# Patient Record
Sex: Female | Born: 1970 | Race: White | Hispanic: No | Marital: Married | Smoking: Former smoker
Health system: Southern US, Community
[De-identification: ages and names within clinical notes are randomized; demographics above are authoritative.]

## PROBLEM LIST (undated history)

## (undated) DIAGNOSIS — G479 Sleep disorder, unspecified: Secondary | ICD-10-CM

## (undated) DIAGNOSIS — K219 Gastro-esophageal reflux disease without esophagitis: Secondary | ICD-10-CM

## (undated) DIAGNOSIS — F319 Bipolar disorder, unspecified: Secondary | ICD-10-CM

## (undated) DIAGNOSIS — R000 Tachycardia, unspecified: Secondary | ICD-10-CM

## (undated) DIAGNOSIS — F411 Generalized anxiety disorder: Secondary | ICD-10-CM

## (undated) DIAGNOSIS — R002 Palpitations: Secondary | ICD-10-CM

## (undated) DIAGNOSIS — R7303 Prediabetes: Secondary | ICD-10-CM

## (undated) DIAGNOSIS — E119 Type 2 diabetes mellitus without complications: Secondary | ICD-10-CM

## (undated) DIAGNOSIS — E785 Hyperlipidemia, unspecified: Secondary | ICD-10-CM

## (undated) DIAGNOSIS — D649 Anemia, unspecified: Secondary | ICD-10-CM

## (undated) DIAGNOSIS — M549 Dorsalgia, unspecified: Secondary | ICD-10-CM

## (undated) DIAGNOSIS — Z87440 Personal history of urinary (tract) infections: Secondary | ICD-10-CM

## (undated) DIAGNOSIS — Z9889 Other specified postprocedural states: Secondary | ICD-10-CM

## (undated) DIAGNOSIS — G4730 Sleep apnea, unspecified: Secondary | ICD-10-CM

## (undated) DIAGNOSIS — I951 Orthostatic hypotension: Secondary | ICD-10-CM

## (undated) DIAGNOSIS — D509 Iron deficiency anemia, unspecified: Secondary | ICD-10-CM

## (undated) DIAGNOSIS — Z8489 Family history of other specified conditions: Secondary | ICD-10-CM

## (undated) DIAGNOSIS — F32A Depression, unspecified: Secondary | ICD-10-CM

## (undated) DIAGNOSIS — N209 Urinary calculus, unspecified: Secondary | ICD-10-CM

## (undated) DIAGNOSIS — K5909 Other constipation: Secondary | ICD-10-CM

## (undated) DIAGNOSIS — E039 Hypothyroidism, unspecified: Secondary | ICD-10-CM

## (undated) DIAGNOSIS — R112 Nausea with vomiting, unspecified: Secondary | ICD-10-CM

## (undated) DIAGNOSIS — R0602 Shortness of breath: Secondary | ICD-10-CM

## (undated) DIAGNOSIS — M2550 Pain in unspecified joint: Secondary | ICD-10-CM

## (undated) DIAGNOSIS — E559 Vitamin D deficiency, unspecified: Secondary | ICD-10-CM

## (undated) DIAGNOSIS — M1990 Unspecified osteoarthritis, unspecified site: Secondary | ICD-10-CM

## (undated) DIAGNOSIS — K449 Diaphragmatic hernia without obstruction or gangrene: Secondary | ICD-10-CM

## (undated) DIAGNOSIS — J302 Other seasonal allergic rhinitis: Secondary | ICD-10-CM

## (undated) DIAGNOSIS — N200 Calculus of kidney: Secondary | ICD-10-CM

## (undated) DIAGNOSIS — Z973 Presence of spectacles and contact lenses: Secondary | ICD-10-CM

## (undated) DIAGNOSIS — K5900 Constipation, unspecified: Secondary | ICD-10-CM

## (undated) DIAGNOSIS — R45851 Suicidal ideations: Secondary | ICD-10-CM

## (undated) DIAGNOSIS — F419 Anxiety disorder, unspecified: Secondary | ICD-10-CM

## (undated) DIAGNOSIS — K760 Fatty (change of) liver, not elsewhere classified: Secondary | ICD-10-CM

## (undated) DIAGNOSIS — R531 Weakness: Secondary | ICD-10-CM

## (undated) DIAGNOSIS — Z87442 Personal history of urinary calculi: Secondary | ICD-10-CM

## (undated) DIAGNOSIS — Z8711 Personal history of peptic ulcer disease: Secondary | ICD-10-CM

## (undated) DIAGNOSIS — R1310 Dysphagia, unspecified: Secondary | ICD-10-CM

## (undated) DIAGNOSIS — K5792 Diverticulitis of intestine, part unspecified, without perforation or abscess without bleeding: Secondary | ICD-10-CM

## (undated) DIAGNOSIS — R0683 Snoring: Secondary | ICD-10-CM

## (undated) DIAGNOSIS — E781 Pure hyperglyceridemia: Secondary | ICD-10-CM

## (undated) DIAGNOSIS — T7840XA Allergy, unspecified, initial encounter: Secondary | ICD-10-CM

## (undated) DIAGNOSIS — R42 Dizziness and giddiness: Secondary | ICD-10-CM

## (undated) DIAGNOSIS — Z8659 Personal history of other mental and behavioral disorders: Secondary | ICD-10-CM

## (undated) DIAGNOSIS — F329 Major depressive disorder, single episode, unspecified: Secondary | ICD-10-CM

## (undated) DIAGNOSIS — Z8619 Personal history of other infectious and parasitic diseases: Secondary | ICD-10-CM

## (undated) DIAGNOSIS — F99 Mental disorder, not otherwise specified: Secondary | ICD-10-CM

## (undated) HISTORY — DX: Pain in unspecified joint: M25.50

## (undated) HISTORY — DX: Tachycardia, unspecified: R00.0

## (undated) HISTORY — DX: Fatty (change of) liver, not elsewhere classified: K76.0

## (undated) HISTORY — DX: Vitamin D deficiency, unspecified: E55.9

## (undated) HISTORY — DX: Weakness: R53.1

## (undated) HISTORY — DX: Dysphagia, unspecified: R13.10

## (undated) HISTORY — DX: Snoring: R06.83

## (undated) HISTORY — PX: TUBAL LIGATION: SHX77

## (undated) HISTORY — PX: NASAL SINUS SURGERY: SHX719

## (undated) HISTORY — DX: Suicidal ideations: R45.851

## (undated) HISTORY — PX: ESOPHAGOGASTRODUODENOSCOPY: SHX1529

## (undated) HISTORY — DX: Palpitations: R00.2

## (undated) HISTORY — DX: Shortness of breath: R06.02

## (undated) HISTORY — DX: Anemia, unspecified: D64.9

## (undated) HISTORY — PX: ANTERIOR CERVICAL DECOMP/DISCECTOMY FUSION: SHX1161

## (undated) HISTORY — DX: Type 2 diabetes mellitus without complications: E11.9

## (undated) HISTORY — DX: Personal history of peptic ulcer disease: Z87.11

## (undated) HISTORY — DX: Constipation, unspecified: K59.00

## (undated) HISTORY — DX: Prediabetes: R73.03

## (undated) HISTORY — PX: DIAGNOSTIC LAPAROSCOPY: SUR761

## (undated) HISTORY — DX: Dizziness and giddiness: R42

## (undated) HISTORY — DX: Allergy, unspecified, initial encounter: T78.40XA

## (undated) HISTORY — DX: Orthostatic hypotension: I95.1

## (undated) HISTORY — DX: Pure hyperglyceridemia: E78.1

## (undated) HISTORY — DX: Dorsalgia, unspecified: M54.9

---
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
## 1990-02-11 HISTORY — PX: DILATION AND CURETTAGE OF UTERUS: SHX78

## 1993-02-11 HISTORY — PX: BREAST REDUCTION SURGERY: SHX8

## 1993-02-11 HISTORY — PX: BREAST SURGERY: SHX581

## 1996-02-12 HISTORY — PX: MICROTUBOPLASTY: SHX5401

## 1997-08-29 ENCOUNTER — Encounter: Admission: RE | Admit: 1997-08-29 | Discharge: 1997-11-27 | Payer: Self-pay | Admitting: *Deleted

## 1998-05-19 ENCOUNTER — Other Ambulatory Visit: Admission: RE | Admit: 1998-05-19 | Discharge: 1998-05-19 | Payer: Self-pay | Admitting: *Deleted

## 1999-04-17 ENCOUNTER — Encounter: Admission: RE | Admit: 1999-04-17 | Discharge: 1999-04-17 | Payer: Self-pay | Admitting: *Deleted

## 1999-06-06 ENCOUNTER — Other Ambulatory Visit: Admission: RE | Admit: 1999-06-06 | Discharge: 1999-06-06 | Payer: Self-pay | Admitting: Obstetrics and Gynecology

## 1999-10-29 ENCOUNTER — Encounter (INDEPENDENT_AMBULATORY_CARE_PROVIDER_SITE_OTHER): Payer: Self-pay

## 1999-10-29 ENCOUNTER — Other Ambulatory Visit: Admission: RE | Admit: 1999-10-29 | Discharge: 1999-10-29 | Payer: Self-pay | Admitting: Otolaryngology

## 2000-06-13 ENCOUNTER — Other Ambulatory Visit: Admission: RE | Admit: 2000-06-13 | Discharge: 2000-06-13 | Payer: Self-pay | Admitting: Obstetrics and Gynecology

## 2000-07-31 ENCOUNTER — Encounter: Payer: Self-pay | Admitting: Gastroenterology

## 2000-07-31 ENCOUNTER — Ambulatory Visit (HOSPITAL_COMMUNITY): Admission: RE | Admit: 2000-07-31 | Discharge: 2000-07-31 | Payer: Self-pay | Admitting: Gastroenterology

## 2000-11-14 ENCOUNTER — Encounter (INDEPENDENT_AMBULATORY_CARE_PROVIDER_SITE_OTHER): Payer: Self-pay | Admitting: Specialist

## 2000-11-14 ENCOUNTER — Ambulatory Visit (HOSPITAL_COMMUNITY): Admission: RE | Admit: 2000-11-14 | Discharge: 2000-11-14 | Payer: Self-pay | Admitting: Gastroenterology

## 2001-01-01 ENCOUNTER — Encounter: Admission: RE | Admit: 2001-01-01 | Discharge: 2001-01-01 | Payer: Self-pay | Admitting: Obstetrics and Gynecology

## 2001-01-01 ENCOUNTER — Encounter: Payer: Self-pay | Admitting: Obstetrics and Gynecology

## 2001-01-29 ENCOUNTER — Encounter: Payer: Self-pay | Admitting: Family Medicine

## 2001-01-29 ENCOUNTER — Ambulatory Visit (HOSPITAL_COMMUNITY): Admission: RE | Admit: 2001-01-29 | Discharge: 2001-01-29 | Payer: Self-pay | Admitting: Family Medicine

## 2001-04-01 ENCOUNTER — Encounter: Payer: Self-pay | Admitting: Neurosurgery

## 2001-04-01 ENCOUNTER — Observation Stay (HOSPITAL_COMMUNITY): Admission: RE | Admit: 2001-04-01 | Discharge: 2001-04-01 | Payer: Self-pay | Admitting: Neurosurgery

## 2001-06-16 ENCOUNTER — Other Ambulatory Visit: Admission: RE | Admit: 2001-06-16 | Discharge: 2001-06-16 | Payer: Self-pay | Admitting: Obstetrics & Gynecology

## 2002-02-11 HISTORY — PX: CERVICAL SPINE SURGERY: SHX589

## 2002-07-30 ENCOUNTER — Other Ambulatory Visit: Admission: RE | Admit: 2002-07-30 | Discharge: 2002-07-30 | Payer: Self-pay | Admitting: Obstetrics & Gynecology

## 2003-04-27 ENCOUNTER — Encounter: Admission: RE | Admit: 2003-04-27 | Discharge: 2003-04-27 | Payer: Self-pay | Admitting: Obstetrics & Gynecology

## 2006-11-03 DIAGNOSIS — D229 Melanocytic nevi, unspecified: Secondary | ICD-10-CM

## 2006-11-03 DIAGNOSIS — C439 Malignant melanoma of skin, unspecified: Secondary | ICD-10-CM

## 2006-11-03 HISTORY — DX: Melanocytic nevi, unspecified: D22.9

## 2006-11-03 HISTORY — DX: Malignant melanoma of skin, unspecified: C43.9

## 2007-11-03 ENCOUNTER — Emergency Department (HOSPITAL_COMMUNITY): Admission: EM | Admit: 2007-11-03 | Discharge: 2007-11-03 | Payer: Self-pay | Admitting: Emergency Medicine

## 2008-02-22 DIAGNOSIS — C8590 Non-Hodgkin lymphoma, unspecified, unspecified site: Secondary | ICD-10-CM | POA: Insufficient documentation

## 2008-05-02 ENCOUNTER — Inpatient Hospital Stay (HOSPITAL_COMMUNITY): Admission: RE | Admit: 2008-05-02 | Discharge: 2008-05-04 | Payer: Self-pay | Admitting: Psychiatry

## 2008-05-02 ENCOUNTER — Ambulatory Visit: Payer: Self-pay | Admitting: Psychiatry

## 2008-05-02 ENCOUNTER — Emergency Department (HOSPITAL_COMMUNITY): Admission: EM | Admit: 2008-05-02 | Discharge: 2008-05-02 | Payer: Self-pay | Admitting: Emergency Medicine

## 2008-10-29 ENCOUNTER — Inpatient Hospital Stay (HOSPITAL_COMMUNITY): Admission: EM | Admit: 2008-10-29 | Discharge: 2008-11-01 | Payer: Self-pay | Admitting: Emergency Medicine

## 2010-05-18 LAB — DIFFERENTIAL
Basophils Absolute: 0 10*3/uL (ref 0.0–0.1)
Basophils Absolute: 0 10*3/uL (ref 0.0–0.1)
Basophils Absolute: 0 10*3/uL (ref 0.0–0.1)
Basophils Relative: 0 % (ref 0–1)
Basophils Relative: 0 % (ref 0–1)
Basophils Relative: 0 % (ref 0–1)
Eosinophils Absolute: 0.2 10*3/uL (ref 0.0–0.7)
Eosinophils Absolute: 0.2 10*3/uL (ref 0.0–0.7)
Eosinophils Absolute: 0.3 10*3/uL (ref 0.0–0.7)
Eosinophils Relative: 1 % (ref 0–5)
Eosinophils Relative: 2 % (ref 0–5)
Eosinophils Relative: 3 % (ref 0–5)
Lymphocytes Relative: 16 % (ref 12–46)
Lymphocytes Relative: 20 % (ref 12–46)
Lymphocytes Relative: 22 % (ref 12–46)
Lymphs Abs: 1.9 10*3/uL (ref 0.7–4.0)
Lymphs Abs: 2 10*3/uL (ref 0.7–4.0)
Lymphs Abs: 2.2 10*3/uL (ref 0.7–4.0)
Monocytes Absolute: 0.9 10*3/uL (ref 0.1–1.0)
Monocytes Absolute: 1 10*3/uL (ref 0.1–1.0)
Monocytes Absolute: 1.1 10*3/uL — ABNORMAL HIGH (ref 0.1–1.0)
Monocytes Relative: 11 % (ref 3–12)
Monocytes Relative: 8 % (ref 3–12)
Monocytes Relative: 9 % (ref 3–12)
Neutro Abs: 6.5 10*3/uL (ref 1.7–7.7)
Neutro Abs: 6.7 10*3/uL (ref 1.7–7.7)
Neutro Abs: 9.4 10*3/uL — ABNORMAL HIGH (ref 1.7–7.7)
Neutrophils Relative %: 65 % (ref 43–77)
Neutrophils Relative %: 67 % (ref 43–77)
Neutrophils Relative %: 75 % (ref 43–77)

## 2010-05-18 LAB — CBC
HCT: 34.9 % — ABNORMAL LOW (ref 36.0–46.0)
HCT: 36 % (ref 36.0–46.0)
HCT: 36.8 % (ref 36.0–46.0)
Hemoglobin: 12 g/dL (ref 12.0–15.0)
Hemoglobin: 12.5 g/dL (ref 12.0–15.0)
Hemoglobin: 12.7 g/dL (ref 12.0–15.0)
MCHC: 34.4 g/dL (ref 30.0–36.0)
MCHC: 34.6 g/dL (ref 30.0–36.0)
MCHC: 34.6 g/dL (ref 30.0–36.0)
MCV: 91.6 fL (ref 78.0–100.0)
MCV: 92.2 fL (ref 78.0–100.0)
MCV: 92.6 fL (ref 78.0–100.0)
Platelets: 274 10*3/uL (ref 150–400)
Platelets: 283 10*3/uL (ref 150–400)
Platelets: 301 10*3/uL (ref 150–400)
RBC: 3.77 MIL/uL — ABNORMAL LOW (ref 3.87–5.11)
RBC: 3.91 MIL/uL (ref 3.87–5.11)
RBC: 4.02 MIL/uL (ref 3.87–5.11)
RDW: 13.2 % (ref 11.5–15.5)
RDW: 13.3 % (ref 11.5–15.5)
RDW: 13.3 % (ref 11.5–15.5)
WBC: 10 10*3/uL (ref 4.0–10.5)
WBC: 10.1 10*3/uL (ref 4.0–10.5)
WBC: 12.6 10*3/uL — ABNORMAL HIGH (ref 4.0–10.5)

## 2010-05-18 LAB — BASIC METABOLIC PANEL
BUN: 5 mg/dL — ABNORMAL LOW (ref 6–23)
BUN: 6 mg/dL (ref 6–23)
CO2: 25 mEq/L (ref 19–32)
CO2: 30 mEq/L (ref 19–32)
Calcium: 8.4 mg/dL (ref 8.4–10.5)
Calcium: 9 mg/dL (ref 8.4–10.5)
Chloride: 109 mEq/L (ref 96–112)
Chloride: 113 mEq/L — ABNORMAL HIGH (ref 96–112)
Creatinine, Ser: 0.55 mg/dL (ref 0.4–1.2)
Creatinine, Ser: 0.57 mg/dL (ref 0.4–1.2)
GFR calc Af Amer: >60 mL/min (ref >60)
GFR calc Af Amer: >60 mL/min (ref >60)
GFR calc non Af Amer: >60 mL/min (ref >60)
GFR calc non Af Amer: >60 mL/min (ref >60)
Glucose, Bld: 77 mg/dL (ref 70–99)
Glucose, Bld: 80 mg/dL (ref 70–99)
Potassium: 3.4 mEq/L — ABNORMAL LOW (ref 3.5–5.1)
Potassium: 4 mEq/L (ref 3.5–5.1)
Sodium: 143 mEq/L (ref 135–145)
Sodium: 143 mEq/L (ref 135–145)

## 2010-05-18 LAB — MAGNESIUM: Magnesium: 2 mg/dL (ref 1.5–2.5)

## 2010-05-18 LAB — ETHANOL: Alcohol, Ethyl (B): <5 mg/dL (ref 0–10)

## 2010-05-18 LAB — BLOOD GAS, ARTERIAL
Acid-Base Excess: 0.4 mmol/L (ref 0.0–2.0)
Bicarbonate: 24.2 mEq/L — ABNORMAL HIGH (ref 20.0–24.0)
O2 Content: 21 L/min
O2 Saturation: 98.2 %
Patient temperature: 37
TCO2: 21.7 mmol/L (ref 0–100)
pCO2 arterial: 37 mmHg (ref 35.0–45.0)
pH, Arterial: 7.431 — ABNORMAL HIGH (ref 7.350–7.400)
pO2, Arterial: 106 mmHg — ABNORMAL HIGH (ref 80.0–100.0)

## 2010-05-18 LAB — SALICYLATE LEVEL: Salicylate Lvl: <4 mg/dL (ref 2.8–20.0)

## 2010-05-18 LAB — HEPATIC FUNCTION PANEL
ALT: 15 U/L (ref 0–35)
AST: 23 U/L (ref 0–37)
Albumin: 3.4 g/dL — ABNORMAL LOW (ref 3.5–5.2)
Alkaline Phosphatase: 59 U/L (ref 39–117)
Bilirubin, Direct: 0.1 mg/dL (ref 0.0–0.3)
Indirect Bilirubin: 0.3 mg/dL (ref 0.3–0.9)
Total Bilirubin: 0.4 mg/dL (ref 0.3–1.2)
Total Protein: 5.7 g/dL — ABNORMAL LOW (ref 6.0–8.3)

## 2010-05-18 LAB — COMPREHENSIVE METABOLIC PANEL
ALT: 12 U/L (ref 0–35)
AST: 18 U/L (ref 0–37)
Albumin: 3.2 g/dL — ABNORMAL LOW (ref 3.5–5.2)
Alkaline Phosphatase: 48 U/L (ref 39–117)
BUN: 4 mg/dL — ABNORMAL LOW (ref 6–23)
CO2: 26 mEq/L (ref 19–32)
Calcium: 8.3 mg/dL — ABNORMAL LOW (ref 8.4–10.5)
Chloride: 113 mEq/L — ABNORMAL HIGH (ref 96–112)
Creatinine, Ser: 0.38 mg/dL — ABNORMAL LOW (ref 0.4–1.2)
GFR calc Af Amer: >60 mL/min (ref >60)
GFR calc non Af Amer: >60 mL/min (ref >60)
Glucose, Bld: 82 mg/dL (ref 70–99)
Potassium: 3.8 mEq/L (ref 3.5–5.1)
Sodium: 143 mEq/L (ref 135–145)
Total Bilirubin: 0.7 mg/dL (ref 0.3–1.2)
Total Protein: 5.3 g/dL — ABNORMAL LOW (ref 6.0–8.3)

## 2010-05-18 LAB — URINALYSIS, ROUTINE W REFLEX MICROSCOPIC
Bilirubin Urine: NEGATIVE
Glucose, UA: NEGATIVE mg/dL
Hgb urine dipstick: NEGATIVE
Ketones, ur: NEGATIVE mg/dL
Nitrite: NEGATIVE
Protein, ur: NEGATIVE mg/dL
Specific Gravity, Urine: 1.01 (ref 1.005–1.030)
Urobilinogen, UA: 0.2 mg/dL (ref 0.0–1.0)
pH: 6 (ref 5.0–8.0)

## 2010-05-18 LAB — RAPID URINE DRUG SCREEN, HOSP PERFORMED
Amphetamines: NOT DETECTED
Barbiturates: NOT DETECTED
Benzodiazepines: POSITIVE — AB
Cocaine: NOT DETECTED
Opiates: NOT DETECTED
Tetrahydrocannabinol: NOT DETECTED

## 2010-05-18 LAB — VALPROIC ACID LEVEL
Valproic Acid Lvl: 44 ug/mL — ABNORMAL LOW (ref 50.0–100.0)
Valproic Acid Lvl: 46.5 ug/mL — ABNORMAL LOW (ref 50.0–100.0)

## 2010-05-18 LAB — ACETAMINOPHEN LEVEL: Acetaminophen (Tylenol), Serum: <10 ug/mL — ABNORMAL LOW (ref 10–30)

## 2010-05-18 LAB — PREGNANCY, URINE: Preg Test, Ur: NEGATIVE

## 2010-05-24 LAB — BASIC METABOLIC PANEL WITH GFR
BUN: 12 mg/dL (ref 6–23)
CO2: 23 meq/L (ref 19–32)
Calcium: 9.1 mg/dL (ref 8.4–10.5)
Chloride: 111 meq/L (ref 96–112)
Creatinine, Ser: 0.75 mg/dL (ref 0.4–1.2)
GFR calc non Af Amer: >60 mL/min
Glucose, Bld: 78 mg/dL (ref 70–99)
Potassium: 4 meq/L (ref 3.5–5.1)
Sodium: 141 meq/L (ref 135–145)

## 2010-05-24 LAB — DIFFERENTIAL
Basophils Absolute: 0.3 10*3/uL — ABNORMAL HIGH (ref 0.0–0.1)
Basophils Relative: 2 % — ABNORMAL HIGH (ref 0–1)
Eosinophils Absolute: 0.1 10*3/uL (ref 0.0–0.7)
Eosinophils Relative: 1 % (ref 0–5)
Lymphocytes Relative: 20 % (ref 12–46)
Lymphs Abs: 2.9 10*3/uL (ref 0.7–4.0)
Monocytes Absolute: 1.2 10*3/uL — ABNORMAL HIGH (ref 0.1–1.0)
Monocytes Relative: 8 % (ref 3–12)
Neutro Abs: 10 10*3/uL — ABNORMAL HIGH (ref 1.7–7.7)
Neutrophils Relative %: 69 % (ref 43–77)

## 2010-05-24 LAB — COMPREHENSIVE METABOLIC PANEL
ALT: 13 U/L (ref 0–35)
AST: 15 U/L (ref 0–37)
Albumin: 4.3 g/dL (ref 3.5–5.2)
Alkaline Phosphatase: 63 U/L (ref 39–117)
BUN: 14 mg/dL (ref 6–23)
CO2: 22 mEq/L (ref 19–32)
Calcium: 9.4 mg/dL (ref 8.4–10.5)
Chloride: 109 mEq/L (ref 96–112)
Creatinine, Ser: 0.6 mg/dL (ref 0.4–1.2)
GFR calc Af Amer: >60 mL/min (ref >60)
GFR calc non Af Amer: >60 mL/min (ref >60)
Glucose, Bld: 93 mg/dL (ref 70–99)
Potassium: 3 mEq/L — ABNORMAL LOW (ref 3.5–5.1)
Sodium: 138 mEq/L (ref 135–145)
Total Bilirubin: 0.7 mg/dL (ref 0.3–1.2)
Total Protein: 7.3 g/dL (ref 6.0–8.3)

## 2010-05-24 LAB — ACETAMINOPHEN LEVEL: Acetaminophen (Tylenol), Serum: <10 ug/mL — ABNORMAL LOW (ref 10–30)

## 2010-05-24 LAB — URINALYSIS, ROUTINE W REFLEX MICROSCOPIC
Bilirubin Urine: NEGATIVE
Glucose, UA: NEGATIVE mg/dL
Hgb urine dipstick: NEGATIVE
Ketones, ur: NEGATIVE mg/dL
Nitrite: NEGATIVE
Protein, ur: NEGATIVE mg/dL
Specific Gravity, Urine: 1.026 (ref 1.005–1.030)
Urobilinogen, UA: 1 mg/dL (ref 0.0–1.0)
pH: 6 (ref 5.0–8.0)

## 2010-05-24 LAB — GC/CHLAMYDIA PROBE AMP, URINE
Chlamydia, Swab/Urine, PCR: NEGATIVE
GC Probe Amp, Urine: NEGATIVE

## 2010-05-24 LAB — LIPID PANEL
Cholesterol: 161 mg/dL (ref 0–200)
HDL: 33 mg/dL — ABNORMAL LOW (ref >39)
LDL Cholesterol: 104 mg/dL — ABNORMAL HIGH (ref 0–99)
Total CHOL/HDL Ratio: 4.9 RATIO
Triglycerides: 121 mg/dL (ref <150)
VLDL: 24 mg/dL (ref 0–40)

## 2010-05-24 LAB — CBC
HCT: 42.5 % (ref 36.0–46.0)
Hemoglobin: 14.5 g/dL (ref 12.0–15.0)
MCHC: 34 g/dL (ref 30.0–36.0)
MCV: 91.2 fL (ref 78.0–100.0)
Platelets: 347 10*3/uL (ref 150–400)
RBC: 4.66 MIL/uL (ref 3.87–5.11)
RDW: 12.7 % (ref 11.5–15.5)
WBC: 14.5 10*3/uL — ABNORMAL HIGH (ref 4.0–10.5)

## 2010-05-24 LAB — RAPID URINE DRUG SCREEN, HOSP PERFORMED
Amphetamines: POSITIVE — AB
Barbiturates: NOT DETECTED
Benzodiazepines: NOT DETECTED
Cocaine: NOT DETECTED
Opiates: NOT DETECTED
Tetrahydrocannabinol: NOT DETECTED

## 2010-05-24 LAB — HEMOGLOBIN A1C
Hgb A1c MFr Bld: 4.7 % (ref 4.6–6.1)
Mean Plasma Glucose: 88 mg/dL

## 2010-05-24 LAB — VALPROIC ACID LEVEL: Valproic Acid Lvl: 49 ug/mL — ABNORMAL LOW (ref 50.0–100.0)

## 2010-05-24 LAB — ETHANOL: Alcohol, Ethyl (B): <5 mg/dL (ref 0–10)

## 2010-05-24 LAB — POCT PREGNANCY, URINE: Preg Test, Ur: NEGATIVE

## 2010-05-24 LAB — T4, FREE: Free T4: 1.23 ng/dL (ref 0.89–1.80)

## 2010-05-24 LAB — SYPHILIS: RPR W/REFLEX TO RPR TITER AND TREPONEMAL ANTIBODIES, TRADITIONAL SCREENING AND DIAGNOSIS ALGORITHM: RPR Ser Ql: NONREACTIVE

## 2010-05-24 LAB — VITAMIN B12: Vitamin B-12: 392 pg/mL (ref 211–911)

## 2010-05-24 LAB — SALICYLATE LEVEL: Salicylate Lvl: <4 mg/dL (ref 2.8–20.0)

## 2010-05-24 LAB — TSH: TSH: 2.324 u[IU]/mL (ref 0.350–4.500)

## 2010-05-24 LAB — MAGNESIUM: Magnesium: 2.1 mg/dL (ref 1.5–2.5)

## 2010-06-26 NOTE — H&P (Signed)
Kristen [name], Kristen [name]              ACCOUNT NO.:  [id number]   MEDICAL RECORD NO.:  [id number]          PATIENT TYPE:  IPS   LOCATION:  0405                          FACILITY:  [facility]   PHYSICIAN:  Anselm Jungling, MD  DATE OF BIRTH:  05/21/1970   DATE OF ADMISSION:  05/02/2008  DATE OF DISCHARGE:                       PSYCHIATRIC ADMISSION ASSESSMENT   IDENTIFYING INFORMATION:  A 40 year old female, married, and separated.  This is a voluntary admission.   HISTORY OF PRESENT ILLNESS:  First inpatient psychiatric admission for  this 40 year old who reports a history of mood swings getting  progressively worse over the last few years.  The day before yesterday,  she took 3 Klonopin, 2 Adderall, 1 Lamictal, and 3 Goody's powder at  once.  She had been writing suicide letters to her children; however,  these are not in the record.  Then she had threatened suicide to a  friend who brought her to the Emergency Room.  Since admission, she has  been quite tearful complaining of mood swings and not having a stable  mood for the past couple of years.  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  She reports going through a period of extreme  lows when she is able to sleep but no energy to get organized or  accomplish any tasks, and this would last for several days at a time  alternating with episodes of increased energy with poor sleep, impulsive  behavior, increased sex drive, racing thoughts, regretting impulsive  behaviors, and finding herself in compromised sexual situations.  Most  recently she had been taking her Depakote in hit or miss fashion over  the course of the past 3 weeks because she did not have her Topamax  prescription filled and she feared that without the  Topamax she would  suffer weight gain by taking the Depakote.  She endorses mood swings,  sadness, and suicidal thoughts with plans to overdose.  She had counted  out her pills at home to see if she had enough to harm herself.   PAST PSYCHIATRIC HISTORY:  Currently followed as an outpatient by Dr.  Milagros Evener.  She reports a history of mood disorder for many years  and has generally been compliant with her medications.  Has a past  history of previous medication trials including Wellbutrin which  increases her anxiety.  She reports she is intolerant of it and Seroquel  which she took in 2004 of which she has no memory of its effects.  She  has history of alcohol abuse and has been abstinent from alcohol since  July 2009.   SOCIAL HISTORY:  Married female with 2 sons 76 and 12 years old.  Separated on and off from her husband for many years.  He continues to  be supportive of her.  She reports feeling very stressed and quite  ashamed of her inability to remain stable in mood  and maintain a  cohesive family.  She has recently had relationships with a boyfriend  and wants to sever that relationship.  No legal problems.   FAMILY HISTORY:  Father and several uncles with history of alcohol  abuse.   MEDICAL HISTORY:  Medical history, primary care practitioner is Dr. Fara Chute in [place], [place].  Medical problems are hypokalemia,  caffeine withdrawal headache, and seasonal allergies.   PAST MEDICAL HISTORY:  1. Bilateral tubal ligation.  2. Two C sections.  3. Endometrial ablation.  4. Breast reduction.  5. History of kidney stones.   CURRENT MEDICATIONS:  1. Zyrtec 10 mg daily for allergies or Benadryl as needed.  2. Depakote 1500 mg p.o. q.h.s.  3. Lamictal 100 mg daily.  4. Topamax 150 mg q.h.s. which she has not been taking regularly.  5. Klonopin 1 mg actually takes 1-1/2 tabs once daily.  6. Adderall 20 mg p.o. t.i.d.  7. Zyrtec 10 mg daily for allergies.   DRUG  ALLERGIES:  PENICILLIN and IVP DYE.   PHYSICAL EXAMINATION:  GENERAL:  Physical exam was done in the Emergency  Room, is noted in the record.  Medium build female, tearful, overweight  in no acute distress.  VITAL SIGNS:  Height 5 feet 6 inches tall, 168 pounds, temperature 97.2,  pulse 81, respirations 20, blood pressure 118/81.   DIAGNOSTIC STUDIES:  WBC of 14.5, hemoglobin 14.5, hematocrit 42.5,  platelets 347,000.  Chemistry:  Sodium 138, potassium 3, chloride 109,  carbon dioxide 22, BUN 14, creatinine 0.60, and random glucose 93.  Alcohol level less than 5.  Salicylate level less than 4.  Urine drug  screen positive for amphetamines.   MENTAL STATUS EXAM:  Fully alert female, somewhat labile affect,  nonpressured speech, tearful, crying silently through the interview.  Endorsing a lot of mood fluctuation, embarrassment over her behavior,  getting involved with a man that she felt was not good for her, positive  suicidal thoughts with a plan to overdose on her medications.  Has been  able to maintain abstinence from alcohol.  No homicidal thought.  No  hallucinations.  Endorsing paranoia but does not appear particularly  paranoid.  No guarding to the affect.  Insight adequate.  Impulse  control poor.  Concentration decreased.  Memory intact.   AXIS I:  Rule out bipolar disorder type 1 mixed state.  Alcohol abuse in  partial remission.  AXIS II:  No diagnosis.  AXIS III:  Seasonal allergies, caffeine withdrawal headache, history of  dyslipidemia.  AXIS IV:  Severe issues with family and marital discord.  AXIS V:  Current 42, past year not known.   Plan is to voluntarily admit her to stabilize her mood, alleviate her  suicidal thought.  We are going to check some basic labs including TSH,  free T4, and Depakote level.  We are going to continue her Depakote at  this point.  Treat her current headache with Benadryl 50 mg now and will  give her some Excedrin Extra Strength for her  headache and will consider  the possibility of adding a second mood stabilizer.     Margaret A. Lorin Picket, N.P.      Anselm Jungling, MD  Electronically Signed   MAS/MEDQ  D:  05/03/2008  T:  05/03/2008  Job:  [id number]

## 2010-06-29 NOTE — Procedures (Signed)
[place]  Patient:    Kristen, [name] Visit Number: [id number] MRN: [id number]          Service Type: END Location: ENDO Attending Physician:  Nelda Marseille Dictated by:   Petra Kuba, M.D. Proc. Date: 11/14/00 Admit Date:  11/14/2000   CC:         Dr. Gaynelle [name]   Procedure Report  PROCEDURE:  Esophagogastroduodenoscopy with biopsy.  INDICATION:  Patient with upper tract symptoms nonresponsive to current therapy.  Want to rule out particular etiology.  Consent was signed after risks, benefits, methods, and options thoroughly discussed in the office.  MEDICATIONS:  Demerol 50 mg, Versed 10 mg.  DESCRIPTION OF PROCEDURE:  The video endoscope was inserted by direct vision. Esophagus was normal.  There were no signs of Barretts or significant reflux. There possibly was a tiny hiatal hernia seen.  The scope passed into the stomach, advanced through a normal antrum, normal pylorus, into a normal duodenal bulb, and around the C-loop to a normal second portion of the duodenum.  The scope was withdrawn back to the bulb, which was normal.  The scope was withdrawn back to the stomach and retroflexed.  Cardia, fundus, angularis, lesser and greater curve were normal on retroflex visualization. The scope was straightened, and straight visualization of the stomach was essentially normal.  I went ahead and took two biopsies of the antrum and two of the proximal stomach to rule out recurrent Helicobacter, since she has been treated in the past.  Air was suctioned and the scope was slowly withdrawn. Again, a good look at her esophagus was normal.  The scope was removed.  The patient tolerated the procedure well, and there was no obvious immediate complication.  ENDOSCOPIC DIAGNOSES: 1. Questionable tiny hiatal hernia. 2. Otherwise essentially normal EGD, status post stomach biopsy to rule out    Helicobacter.  PLAN:  Await pathology.  Probably  would favor changing pump inhibitors or even a _____ medicine like Reglan, but will wait on the biopsies to decide. Dictated by:   Petra Kuba, M.D. Attending Physician:  Nelda Marseille DD:  11/14/00 TD:  11/14/00 Job: [id number]

## 2010-06-29 NOTE — Discharge Summary (Signed)
[name], Kristen [name]              ACCOUNT NO.:  [id number]   MEDICAL RECORD NO.:  [id number]          PATIENT TYPE:  IPS   LOCATION:  0405                          [facility]ACILITY:  [facility]   PHYSICIAN:  Anselm Jungling, MD  DATE OF BIRTH:  [birth year]   DATE OF ADMISSION:  05/02/2008  DATE OF DISCHARGE:  05/04/2008                               DISCHARGE SUMMARY   NO DICTATION GIVEN FOR THIS JOB.      Anselm Jungling, MD  Electronically Signed     SPB/MEDQ  D:  06/02/2008  T:  06/02/2008  Job:  [id number]

## 2010-06-29 NOTE — Op Note (Signed)
[place]  Patient:    SIDNI, FUSCO Visit Number: [id number] MRN: [id number]          Service Type: Attending:  Hewitt Shorts, M.D. Dictated by:   Hewitt Shorts, M.D. Proc. Date: 04/01/01                             Operative Report  PREOPERATIVE DIAGNOSIS:  C5-6 degenerative disk disease, spondylosis, and disk herniation.  POSTOPERATIVE DIAGNOSIS:  C5-6 degenerative disk disease, spondylosis, and disk herniation.  PROCEDURE:  C5-6 anterior cervical diskectomy and arthrodesis with iliac crest allograft and Tether cervical plating.  SURGEON:  Hewitt Shorts, M.D.  ASSISTANT:  Payton Doughty, M.D.  ANESTHESIA:  General endotracheal.  INDICATION:  The patient is a 40 year old woman who presented with a left cervical radiculopathy, who was found to have degenerative disk disease with spondylosis and superimposed disk herniation, and her exam was notable for left triceps weakness.  A decision was made to proceed with a single-level anterior cervical diskectomy and arthrodesis.  DESCRIPTION OF PROCEDURE:  Patient brought to the operating room and placed under general endotracheal anesthesia.  The patient was placed in 10 pounds of Holter traction and the neck was prepped with Betadine soap and solution and draped in a sterile fashion.  A horizontal incision was made on the left side of the neck.  The line of the incision was infiltrated with local anesthetic with epinephrine.  Incision made with the Shaw scalpel at a temperature of 120.  Dissection was carried down through the subcutaneous tissue and platysma.  Dissection was then carried out through an avascular plane, leaving the sternocleidomastoid, carotid artery, and jugular vein laterally, the trachea and esophagus medially.  The ventral aspect of the vertebral column was identified and a localizing x-ray taken and the C5-6 intervertebral disk space identified.   Diskectomy was begun with an incision in the annulus, continued with microcurettes, pituitary rongeurs.  The cartilaginous end plates of the corresponding vertebral bodies were removed using the Micro-Max drill and microcurettes.  The microscope was draped and brought into the field to provide additional magnification, illumination, and visualization, and the remainder of the procedure was performed using microdissection and microsurgical technique.  Posterior osteophytic overgrowth was removed using the Micro-Max drill and a 2 mm Kerrison punch with a thin foot plate. Hypertrophy from the uncinate processes was removed and the foramina decompressed.  The posterior longitudinal ligament was opened, a disk fragment encountered and removed, and the spinal canal and thecal sac and foramina and nerve roots were decompressed bilaterally.  Once the decompression was completed, hemostasis was established with the use of Gelfoam soaked in thrombin.  We then proceeded with the arthrodesis.  We selected a wedge of iliac crest allograft that was cut and shaped to size and positioned in the intervertebral disk space and countersunk.  We then selected a 14 mm Tether cervical plate.  It was positioned over the fusion construct and secured with a pair of 4.0 x 13 mm screws at each level.  Each screw hole was drilled and tapped and the screw placed.  Final tightening was done of all four screws. An x-ray was taken that shows the graft in good position, the plate and screws in good position, and the overall alignment was good.  The wound was irrigated with bacitracin solution and checked for hemostasis, which was established and confirmed, and then we proceeded with  closure.  The platysma was closed with interrupted inverted 2-0 undyed Vicryl sutures, and the subcutaneous and subcuticular layer were closed with interrupted, inverted 3-0 undyed Vicryl sutures, and the skin was reapproximated with Dermabond.   The patient tolerated the procedure well.  The estimated blood loss was 50 cc.  Sponge and needle count were correct.  Following this, the patient was taken out of cervical traction, was reversed from the anesthetic, extubated, and transferred to the recovery room for further care.  She was noted to be moving all four extremities. Dictated by:   Hewitt Shorts, M.D. Attending:  Hewitt Shorts, M.D. DD:  04/01/01 TD:  04/01/01 Job: [id number]

## 2010-11-12 LAB — URINE MICROSCOPIC-ADD ON

## 2010-11-12 LAB — BASIC METABOLIC PANEL
BUN: 13
CO2: 19
Calcium: 9.4
Chloride: 110
Creatinine, Ser: 0.72
GFR calc Af Amer: >60
GFR calc non Af Amer: >60
Glucose, Bld: 120 — ABNORMAL HIGH
Potassium: 3 — ABNORMAL LOW
Sodium: 137

## 2010-11-12 LAB — DIFFERENTIAL
Basophils Absolute: 0
Basophils Relative: 0
Eosinophils Absolute: 0.1
Eosinophils Relative: 1
Lymphocytes Relative: 15
Lymphs Abs: 1.9
Monocytes Absolute: 0.8
Monocytes Relative: 7
Neutro Abs: 9.7 — ABNORMAL HIGH
Neutrophils Relative %: 77

## 2010-11-12 LAB — URINALYSIS, ROUTINE W REFLEX MICROSCOPIC
Bilirubin Urine: NEGATIVE
Glucose, UA: NEGATIVE
Leukocytes, UA: NEGATIVE
Nitrite: NEGATIVE
Protein, ur: NEGATIVE
Specific Gravity, Urine: 1.02
Urobilinogen, UA: 0.2
pH: 8.5 — ABNORMAL HIGH

## 2010-11-12 LAB — CBC
HCT: 37.6
Hemoglobin: 13
MCHC: 34.6
MCV: 91
Platelets: 314
RBC: 4.13
RDW: 13.2
WBC: 12.6 — ABNORMAL HIGH

## 2010-11-12 LAB — PREGNANCY, URINE: Preg Test, Ur: NEGATIVE

## 2011-12-17 ENCOUNTER — Other Ambulatory Visit: Payer: Self-pay | Admitting: Neurosurgery

## 2012-02-03 ENCOUNTER — Encounter (HOSPITAL_COMMUNITY): Payer: Self-pay

## 2012-02-10 ENCOUNTER — Encounter (HOSPITAL_COMMUNITY): Payer: Self-pay

## 2012-02-10 ENCOUNTER — Encounter (HOSPITAL_COMMUNITY)
Admission: RE | Admit: 2012-02-10 | Discharge: 2012-02-10 | Disposition: A | Discharge status: Home / Self Care | Payer: Managed Care, Other (non HMO) | Source: Ambulatory Visit | Attending: Neurosurgery | Admitting: Neurosurgery

## 2012-02-10 HISTORY — DX: Mental disorder, not otherwise specified: F99

## 2012-02-10 HISTORY — DX: Other specified postprocedural states: Z98.890

## 2012-02-10 HISTORY — DX: Nausea with vomiting, unspecified: R11.2

## 2012-02-10 HISTORY — DX: Urinary calculus, unspecified: N20.9

## 2012-02-10 HISTORY — DX: Major depressive disorder, single episode, unspecified: F32.9

## 2012-02-10 HISTORY — DX: Depression, unspecified: F32.A

## 2012-02-10 HISTORY — DX: Anxiety disorder, unspecified: F41.9

## 2012-02-10 HISTORY — DX: Gastro-esophageal reflux disease without esophagitis: K21.9

## 2012-02-10 LAB — BASIC METABOLIC PANEL
BUN: 14 mg/dL (ref 6–23)
CO2: 23 mEq/L (ref 19–32)
Calcium: 9.8 mg/dL (ref 8.4–10.5)
Chloride: 103 mEq/L (ref 96–112)
Creatinine, Ser: 0.64 mg/dL (ref 0.50–1.10)
GFR calc Af Amer: >90 mL/min (ref >90)
GFR calc non Af Amer: >90 mL/min (ref >90)
Glucose, Bld: 95 mg/dL (ref 70–99)
Potassium: 4 mEq/L (ref 3.5–5.1)
Sodium: 140 mEq/L (ref 135–145)

## 2012-02-10 LAB — CBC
HCT: 40.5 % (ref 36.0–46.0)
Hemoglobin: 13.5 g/dL (ref 12.0–15.0)
MCH: 28.7 pg (ref 26.0–34.0)
MCHC: 33.3 g/dL (ref 30.0–36.0)
MCV: 86 fL (ref 78.0–100.0)
Platelets: 472 10*3/uL — ABNORMAL HIGH (ref 150–400)
RBC: 4.71 MIL/uL (ref 3.87–5.11)
RDW: 13.8 % (ref 11.5–15.5)
WBC: 11.7 10*3/uL — ABNORMAL HIGH (ref 4.0–10.5)

## 2012-02-10 LAB — SURGICAL PCR SCREEN
MRSA, PCR: NEGATIVE
Staphylococcus aureus: NEGATIVE

## 2012-02-10 NOTE — Pre-Procedure Instructions (Addendum)
20 Kristen [name]  02/10/2012   Your procedure is scheduled on:  Mon, Jan 6 @ 10:45 AM  Report to [facility] at 7:45 AM.  Call this number if you have problems the morning of surgery: [phone]   Remember:   Do not eat food or drink:After Midnight.    Take these medicines the morning of surgery with A SIP OF WATER: Abilify(Aripiprazole),Wellbutrin(Bupropion),Zyrtec(Cetirizine),Flonase(Fluticasone),and Viibryd(Vilazodone)vyvanse   Stop omega 3, meloxicam now   Do not wear jewelry, make-up or nail polish.  Do not wear lotions, powders, or perfumes. You may wear deodorant.  Do not shave 48 hours prior to surgery.   Do not bring valuables to the hospital.  Contacts, dentures or bridgework may not be worn into surgery.  Leave suitcase in the car. After surgery it may be brought to your room.  For patients admitted to the hospital, checkout time is 11:00 AM the day of discharge.   Patients discharged the day of surgery will not be allowed to drive home.  Casimiro Needle spouse [phone]  Special Instructions: Shower using CHG 2 nights before surgery and the night before surgery.  If you shower the day of surgery use CHG.  Use special wash - you have one bottle of CHG for all showers.  You should use approximately 1/3 of the bottle for each shower.   Please read over the following fact sheets that you were given: Pain Booklet, Coughing and Deep Breathing, MRSA Information and Surgical Site Infection Prevention

## 2012-02-12 HISTORY — PX: ENDOMETRIAL ABLATION: SHX621

## 2012-02-16 MED ORDER — CEFAZOLIN SODIUM-DEXTROSE 2-3 GM-% IV SOLR
2.0000 g | INTRAVENOUS | Status: AC
  Administered 2012-02-17: 2 g via INTRAVENOUS
  Filled 2012-02-16: qty 50

## 2012-02-17 ENCOUNTER — Ambulatory Visit (HOSPITAL_COMMUNITY): Payer: Managed Care, Other (non HMO) | Admitting: Critical Care Medicine

## 2012-02-17 ENCOUNTER — Ambulatory Visit (HOSPITAL_COMMUNITY)
Admission: RE | Admit: 2012-02-17 | Discharge: 2012-02-17 | Disposition: A | Discharge status: Home / Self Care | Payer: Managed Care, Other (non HMO) | Source: Ambulatory Visit | Attending: Neurosurgery | Admitting: Neurosurgery

## 2012-02-17 ENCOUNTER — Encounter (HOSPITAL_COMMUNITY)
Admission: RE | Disposition: A | Discharge status: Home / Self Care | Payer: Self-pay | Source: Ambulatory Visit | Attending: Neurosurgery

## 2012-02-17 ENCOUNTER — Encounter (HOSPITAL_COMMUNITY): Payer: Self-pay | Admitting: Critical Care Medicine

## 2012-02-17 ENCOUNTER — Ambulatory Visit (HOSPITAL_COMMUNITY): Payer: Managed Care, Other (non HMO)

## 2012-02-17 ENCOUNTER — Encounter (HOSPITAL_COMMUNITY): Payer: Self-pay | Admitting: *Deleted

## 2012-02-17 DIAGNOSIS — M47812 Spondylosis without myelopathy or radiculopathy, cervical region: Secondary | ICD-10-CM | POA: Insufficient documentation

## 2012-02-17 DIAGNOSIS — M5020 Other cervical disc displacement, unspecified cervical region: Secondary | ICD-10-CM | POA: Insufficient documentation

## 2012-02-17 DIAGNOSIS — M5030 Other cervical disc degeneration, unspecified cervical region: Secondary | ICD-10-CM | POA: Insufficient documentation

## 2012-02-17 HISTORY — PX: ANTERIOR CERVICAL DECOMP/DISCECTOMY FUSION: SHX1161

## 2012-02-17 SURGERY — ANTERIOR CERVICAL DECOMPRESSION/DISCECTOMY FUSION 2 LEVELS
Anesthesia: General | Site: Spine Cervical | Wound class: Clean

## 2012-02-17 MED ORDER — CYCLOBENZAPRINE HCL 10 MG PO TABS: 10.0000 mg | ORAL_TABLET | Freq: Three times a day (TID) | ORAL | Status: DC | PRN

## 2012-02-17 MED ORDER — KCL IN DEXTROSE-NACL 20-5-0.45 MEQ/L-%-% IV SOLN
INTRAVENOUS | Status: DC
  Administered 2012-02-17: 18:00:00 via INTRAVENOUS
  Filled 2012-02-17 (×2): qty 1000

## 2012-02-17 MED ORDER — DEXTROSE 5 % IV SOLN
INTRAVENOUS | Status: DC | PRN
  Administered 2012-02-17: 14:00:00 via INTRAVENOUS

## 2012-02-17 MED ORDER — GLYCOPYRROLATE 0.2 MG/ML IJ SOLN
INTRAMUSCULAR | Status: DC | PRN
  Administered 2012-02-17: 0.4 mg via INTRAVENOUS

## 2012-02-17 MED ORDER — HYDROCODONE-ACETAMINOPHEN 5-325 MG PO TABS: 1.0000 | ORAL_TABLET | ORAL | Status: DC | PRN

## 2012-02-17 MED ORDER — THROMBIN 5000 UNITS EX SOLR
OROMUCOSAL | Status: DC | PRN
  Administered 2012-02-17 (×2): via TOPICAL

## 2012-02-17 MED ORDER — BUPIVACAINE HCL (PF) 0.5 % IJ SOLN
INTRAMUSCULAR | Status: DC | PRN
  Administered 2012-02-17: 6.5 mL

## 2012-02-17 MED ORDER — VILAZODONE HCL 40 MG PO TABS: 40.0000 mg | ORAL_TABLET | Freq: Every morning | ORAL | Status: DC

## 2012-02-17 MED ORDER — FLUTICASONE PROPIONATE 50 MCG/ACT NA SUSP: 2.0000 | Freq: Every day | NASAL | Status: DC | PRN

## 2012-02-17 MED ORDER — DEXAMETHASONE SODIUM PHOSPHATE 4 MG/ML IJ SOLN
INTRAMUSCULAR | Status: DC | PRN
  Administered 2012-02-17: 8 mg via INTRAVENOUS

## 2012-02-17 MED ORDER — KETOROLAC TROMETHAMINE 30 MG/ML IJ SOLN: 30.0000 mg | Freq: Four times a day (QID) | INTRAMUSCULAR | Status: DC

## 2012-02-17 MED ORDER — ACETAMINOPHEN 10 MG/ML IV SOLN
INTRAVENOUS | Status: AC
  Filled 2012-02-17: qty 100

## 2012-02-17 MED ORDER — PHENOL 1.4 % MT LIQD: 1.0000 | OROMUCOSAL | Status: DC | PRN

## 2012-02-17 MED ORDER — ONDANSETRON HCL 4 MG/2ML IJ SOLN: 4.0000 mg | Freq: Once | INTRAMUSCULAR | Status: DC | PRN

## 2012-02-17 MED ORDER — LACTATED RINGERS IV SOLN
INTRAVENOUS | Status: DC | PRN
  Administered 2012-02-17: 11:00:00 via INTRAVENOUS

## 2012-02-17 MED ORDER — SODIUM CHLORIDE 0.9 % IJ SOLN: 3.0000 mL | Freq: Two times a day (BID) | INTRAMUSCULAR | Status: DC

## 2012-02-17 MED ORDER — MENTHOL 3 MG MT LOZG: 1.0000 | LOZENGE | OROMUCOSAL | Status: DC | PRN

## 2012-02-17 MED ORDER — BUPROPION HCL ER (XL) 300 MG PO TB24
450.0000 mg | ORAL_TABLET | Freq: Every day | ORAL | Status: DC
  Administered 2012-02-17: 450 mg via ORAL
  Filled 2012-02-17: qty 1

## 2012-02-17 MED ORDER — HYDROXYZINE HCL 50 MG/ML IM SOLN: 50.0000 mg | INTRAMUSCULAR | Status: DC | PRN

## 2012-02-17 MED ORDER — SODIUM CHLORIDE 0.9 % IJ SOLN: 3.0000 mL | INTRAMUSCULAR | Status: DC | PRN

## 2012-02-17 MED ORDER — ACETAMINOPHEN 650 MG RE SUPP: 650.0000 mg | RECTAL | Status: DC | PRN

## 2012-02-17 MED ORDER — MORPHINE SULFATE 4 MG/ML IJ SOLN: 4.0000 mg | INTRAMUSCULAR | Status: DC | PRN

## 2012-02-17 MED ORDER — BISACODYL 10 MG RE SUPP: 10.0000 mg | Freq: Every day | RECTAL | Status: DC | PRN

## 2012-02-17 MED ORDER — LIDOCAINE HCL (CARDIAC) 20 MG/ML IV SOLN
INTRAVENOUS | Status: DC | PRN
  Administered 2012-02-17: 100 mg via INTRAVENOUS

## 2012-02-17 MED ORDER — GEMFIBROZIL 600 MG PO TABS
600.0000 mg | ORAL_TABLET | Freq: Two times a day (BID) | ORAL | Status: DC
  Filled 2012-02-17: qty 1

## 2012-02-17 MED ORDER — FENTANYL CITRATE 0.05 MG/ML IJ SOLN
INTRAMUSCULAR | Status: DC | PRN
  Administered 2012-02-17 (×2): 50 ug via INTRAVENOUS
  Administered 2012-02-17: 150 ug via INTRAVENOUS

## 2012-02-17 MED ORDER — SODIUM CHLORIDE 0.9 % IR SOLN
Status: DC | PRN
  Administered 2012-02-17: 12:00:00

## 2012-02-17 MED ORDER — ONDANSETRON HCL 4 MG/2ML IJ SOLN
INTRAMUSCULAR | Status: DC | PRN
  Administered 2012-02-17 (×2): 4 mg via INTRAVENOUS

## 2012-02-17 MED ORDER — ALUM & MAG HYDROXIDE-SIMETH 200-200-20 MG/5ML PO SUSP: 30.0000 mL | Freq: Four times a day (QID) | ORAL | Status: DC | PRN

## 2012-02-17 MED ORDER — THROMBIN 5000 UNITS EX KIT
PACK | CUTANEOUS | Status: DC | PRN
  Administered 2012-02-17 (×2): 5000 [IU] via TOPICAL

## 2012-02-17 MED ORDER — MIDAZOLAM HCL 5 MG/5ML IJ SOLN
INTRAMUSCULAR | Status: DC | PRN
  Administered 2012-02-17: 2 mg via INTRAVENOUS

## 2012-02-17 MED ORDER — HYDROMORPHONE HCL PF 1 MG/ML IJ SOLN: 0.2500 mg | INTRAMUSCULAR | Status: DC | PRN

## 2012-02-17 MED ORDER — NEOSTIGMINE METHYLSULFATE 1 MG/ML IJ SOLN
INTRAMUSCULAR | Status: DC | PRN
  Administered 2012-02-17: 3 mg via INTRAVENOUS

## 2012-02-17 MED ORDER — HYDROXYZINE HCL 25 MG PO TABS: 50.0000 mg | ORAL_TABLET | ORAL | Status: DC | PRN

## 2012-02-17 MED ORDER — ACETAMINOPHEN 10 MG/ML IV SOLN
INTRAVENOUS | Status: DC | PRN
  Administered 2012-02-17: 1000 mg via INTRAVENOUS

## 2012-02-17 MED ORDER — ISOTRETINOIN 40 MG PO CAPS: 80.0000 mg | ORAL_CAPSULE | Freq: Every morning | ORAL | Status: DC

## 2012-02-17 MED ORDER — PHENYLEPHRINE HCL 10 MG/ML IJ SOLN
10.0000 mg | INTRAVENOUS | Status: DC | PRN
  Administered 2012-02-17: 25 ug/min via INTRAVENOUS

## 2012-02-17 MED ORDER — ROCURONIUM BROMIDE 100 MG/10ML IV SOLN
INTRAVENOUS | Status: DC | PRN
  Administered 2012-02-17: 10 mg via INTRAVENOUS
  Administered 2012-02-17: 20 mg via INTRAVENOUS
  Administered 2012-02-17: 50 mg via INTRAVENOUS

## 2012-02-17 MED ORDER — KETOROLAC TROMETHAMINE 30 MG/ML IJ SOLN
30.0000 mg | Freq: Once | INTRAMUSCULAR | Status: AC
  Administered 2012-02-17: 30 mg via INTRAVENOUS

## 2012-02-17 MED ORDER — OMEGA-3-ACID ETHYL ESTERS 1 G PO CAPS: 2.0000 g | ORAL_CAPSULE | Freq: Every morning | ORAL | Status: DC

## 2012-02-17 MED ORDER — ACETAMINOPHEN 325 MG PO TABS: 650.0000 mg | ORAL_TABLET | ORAL | Status: DC | PRN

## 2012-02-17 MED ORDER — HEMOSTATIC AGENTS (NO CHARGE) OPTIME
TOPICAL | Status: DC | PRN
  Administered 2012-02-17: 1 via TOPICAL

## 2012-02-17 MED ORDER — OXYCODONE HCL 5 MG/5ML PO SOLN: 5.0000 mg | Freq: Once | ORAL | Status: DC | PRN

## 2012-02-17 MED ORDER — OXYCODONE HCL 5 MG PO TABS: 5.0000 mg | ORAL_TABLET | ORAL | Status: DC | PRN

## 2012-02-17 MED ORDER — ALPRAZOLAM 0.5 MG PO TABS: 0.5000 mg | ORAL_TABLET | Freq: Every evening | ORAL | Status: DC | PRN

## 2012-02-17 MED ORDER — BACITRACIN 50000 UNITS IM SOLR
INTRAMUSCULAR | Status: AC
  Filled 2012-02-17: qty 1

## 2012-02-17 MED ORDER — LISDEXAMFETAMINE DIMESYLATE 70 MG PO CAPS: 70.0000 mg | ORAL_CAPSULE | Freq: Every morning | ORAL | Status: DC

## 2012-02-17 MED ORDER — ARIPIPRAZOLE 15 MG PO TABS
15.0000 mg | ORAL_TABLET | Freq: Every day | ORAL | Status: DC
Start: 2012-02-17 — End: 2012-02-17
  Administered 2012-02-17: 15 mg via ORAL
  Filled 2012-02-17: qty 1

## 2012-02-17 MED ORDER — LIDOCAINE-EPINEPHRINE 1 %-1:100000 IJ SOLN
INTRAMUSCULAR | Status: DC | PRN
  Administered 2012-02-17: 6.5 mL

## 2012-02-17 MED ORDER — MAGNESIUM HYDROXIDE 400 MG/5ML PO SUSP: 30.0000 mL | Freq: Every day | ORAL | Status: DC | PRN

## 2012-02-17 MED ORDER — MEPERIDINE HCL 25 MG/ML IJ SOLN: 6.2500 mg | INTRAMUSCULAR | Status: DC | PRN

## 2012-02-17 MED ORDER — PROPOFOL 10 MG/ML IV BOLUS
INTRAVENOUS | Status: DC | PRN
  Administered 2012-02-17: 120 mg via INTRAVENOUS

## 2012-02-17 MED ORDER — LACTATED RINGERS IV SOLN
INTRAVENOUS | Status: DC | PRN
  Administered 2012-02-17: 10:00:00 via INTRAVENOUS

## 2012-02-17 MED ORDER — SODIUM CHLORIDE 0.9 % IV SOLN
INTRAVENOUS | Status: AC
  Filled 2012-02-17: qty 500

## 2012-02-17 MED ORDER — ARTIFICIAL TEARS OP OINT
TOPICAL_OINTMENT | OPHTHALMIC | Status: DC | PRN
  Administered 2012-02-17: 1 via OPHTHALMIC

## 2012-02-17 MED ORDER — ZOLPIDEM TARTRATE 5 MG PO TABS: 5.0000 mg | ORAL_TABLET | Freq: Every day | ORAL | Status: DC

## 2012-02-17 MED ORDER — 0.9 % SODIUM CHLORIDE (POUR BTL) OPTIME
TOPICAL | Status: DC | PRN
  Administered 2012-02-17: 1000 mL

## 2012-02-17 MED ORDER — ACETAMINOPHEN 10 MG/ML IV SOLN
1000.0000 mg | Freq: Four times a day (QID) | INTRAVENOUS | Status: DC
  Administered 2012-02-17: 1000 mg via INTRAVENOUS
  Filled 2012-02-17 (×3): qty 100

## 2012-02-17 MED ORDER — OXYCODONE HCL 5 MG PO TABS: 5.0000 mg | ORAL_TABLET | Freq: Once | ORAL | Status: DC | PRN

## 2012-02-17 MED ORDER — LAMOTRIGINE 200 MG PO TABS
400.0000 mg | ORAL_TABLET | Freq: Every day | ORAL | Status: DC
  Filled 2012-02-17: qty 2

## 2012-02-17 MED ORDER — HYDROMORPHONE HCL PF 1 MG/ML IJ SOLN
INTRAMUSCULAR | Status: DC | PRN
  Administered 2012-02-17 (×2): 0.5 mg via INTRAVENOUS

## 2012-02-17 MED ORDER — LORATADINE 10 MG PO TABS: 10.0000 mg | ORAL_TABLET | Freq: Every day | ORAL | Status: DC

## 2012-02-17 SURGICAL SUPPLY — 54 items
ALLOGRAFT 7X14X11 (Bone Implant) ×2 IMPLANT
BAG DECANTER FOR FLEXI CONT (MISCELLANEOUS) ×2 IMPLANT
BIT DRILL NEURO 2X3.1 SFT TUCH (MISCELLANEOUS) ×1 IMPLANT
BLADE ULTRA TIP 2M (BLADE) ×2 IMPLANT
BRUSH SCRUB EZ PLAIN DRY (MISCELLANEOUS) ×2 IMPLANT
CANISTER SUCTION 2500CC (MISCELLANEOUS) ×2 IMPLANT
CLOTH BEACON ORANGE TIMEOUT ST (SAFETY) ×2 IMPLANT
CONT SPEC 4OZ CLIKSEAL STRL BL (MISCELLANEOUS) ×2 IMPLANT
COVER MAYO STAND STRL (DRAPES) ×2 IMPLANT
DECANTER SPIKE VIAL GLASS SM (MISCELLANEOUS) ×2 IMPLANT
DERMABOND ADHESIVE PROPEN (GAUZE/BANDAGES/DRESSINGS) ×1
DERMABOND ADVANCED (GAUZE/BANDAGES/DRESSINGS)
DERMABOND ADVANCED .7 DNX12 (GAUZE/BANDAGES/DRESSINGS) IMPLANT
DERMABOND ADVANCED .7 DNX6 (GAUZE/BANDAGES/DRESSINGS) ×1 IMPLANT
DRAPE LAPAROTOMY 100X72 PEDS (DRAPES) ×2 IMPLANT
DRAPE MICROSCOPE LEICA (MISCELLANEOUS) ×2 IMPLANT
DRAPE POUCH INSTRU U-SHP 10X18 (DRAPES) ×2 IMPLANT
DRAPE PROXIMA HALF (DRAPES) IMPLANT
DRILL NEURO 2X3.1 SOFT TOUCH (MISCELLANEOUS) ×2
ELECT COATED BLADE 2.86 ST (ELECTRODE) ×2 IMPLANT
ELECT REM PT RETURN 9FT ADLT (ELECTROSURGICAL) ×2
ELECTRODE REM PT RTRN 9FT ADLT (ELECTROSURGICAL) ×1 IMPLANT
GAUZE SPONGE 4X4 16PLY XRAY LF (GAUZE/BANDAGES/DRESSINGS) ×2 IMPLANT
GLOVE BIOGEL PI IND STRL 8 (GLOVE) ×3 IMPLANT
GLOVE BIOGEL PI INDICATOR 8 (GLOVE) ×3
GLOVE ECLIPSE 7.0 STRL STRAW (GLOVE) ×2 IMPLANT
GLOVE ECLIPSE 7.5 STRL STRAW (GLOVE) ×8 IMPLANT
GLOVE EXAM NITRILE LRG STRL (GLOVE) IMPLANT
GLOVE EXAM NITRILE MD LF STRL (GLOVE) IMPLANT
GLOVE EXAM NITRILE XL STR (GLOVE) IMPLANT
GLOVE EXAM NITRILE XS STR PU (GLOVE) IMPLANT
GOWN BRE IMP SLV AUR LG STRL (GOWN DISPOSABLE) IMPLANT
GOWN BRE IMP SLV AUR XL STRL (GOWN DISPOSABLE) ×2 IMPLANT
GOWN STRL REIN 2XL LVL4 (GOWN DISPOSABLE) ×2 IMPLANT
GRAFT CORT CANC 14X8.25X11 5D (Bone Implant) ×2 IMPLANT
HEAD HALTER (SOFTGOODS) ×2 IMPLANT
HEMOSTAT POWDER SURGIFOAM 1G (HEMOSTASIS) ×4 IMPLANT
KIT BASIN OR (CUSTOM PROCEDURE TRAY) ×2 IMPLANT
KIT ROOM TURNOVER OR (KITS) ×2 IMPLANT
NEEDLE HYPO 25X1 1.5 SAFETY (NEEDLE) ×2 IMPLANT
NEEDLE SPNL 22GX3.5 QUINCKE BK (NEEDLE) ×4 IMPLANT
NS IRRIG 1000ML POUR BTL (IV SOLUTION) ×2 IMPLANT
PACK LAMINECTOMY NEURO (CUSTOM PROCEDURE TRAY) ×2 IMPLANT
PAD ARMBOARD 7.5X6 YLW CONV (MISCELLANEOUS) ×6 IMPLANT
RUBBERBAND STERILE (MISCELLANEOUS) ×4 IMPLANT
SPONGE INTESTINAL PEANUT (DISPOSABLE) ×4 IMPLANT
SPONGE SURGIFOAM ABS GEL SZ50 (HEMOSTASIS) ×2 IMPLANT
STAPLER SKIN PROX WIDE 3.9 (STAPLE) IMPLANT
SUT VIC AB 2-0 CP2 18 (SUTURE) ×4 IMPLANT
SUT VIC AB 3-0 SH 8-18 (SUTURE) ×4 IMPLANT
SYR 20ML ECCENTRIC (SYRINGE) ×2 IMPLANT
TOWEL OR 17X24 6PK STRL BLUE (TOWEL DISPOSABLE) ×2 IMPLANT
TOWEL OR 17X26 10 PK STRL BLUE (TOWEL DISPOSABLE) ×2 IMPLANT
WATER STERILE IRR 1000ML POUR (IV SOLUTION) ×2 IMPLANT

## 2012-02-17 NOTE — Op Note (Signed)
02/17/2012  2:05 PM  PATIENT:  Kristen [name]  42 y.o. female  PRE-OPERATIVE DIAGNOSIS:  cervical herniated disc, cervical spondylosis, cervical degenerative disc disease, cervical radiculopathy  POST-OPERATIVE DIAGNOSIS:  Cervical hernaited disc, cervial spondylosis, cervical degenerative disc disease, cervical radiculopathy  PROCEDURE:  Procedure(s): ANTERIOR CERVICAL DECOMPRESSION/DISCECTOMY FUSION 2 LEVELS: Removal of C5-6 anterior cervical plate; U0-4 and C6-7 anterior cervical decompression arthrodesis with allograft and tether cervical plating (separate plates at the V4-0 and C6-7 levels).  SURGEON:  Surgeon(s): Hewitt Shorts, MD Temple Pacini, MD  ASSISTANTS: Julio Sicks, M.D.  ANESTHESIA:   general  EBL:  Total I/O In: 1050 [I.V.:1050] Out: 100 [Blood:100]  BLOOD ADMINISTERED:none  COUNT: Correct per nursing staff  DICTATION:  Patient was brought to the operating room placed under general endotracheal anesthesia. Patient was placed in 10 pounds of halter traction. The neck was prepped with Betadine soap and solution and draped in a sterile fashion. A obliquel incision was made on the left side of the neck paralleling the anterior border of the sternocleidomastoid.. The line of the incision was infiltrated with local anesthetic with epinephrine. Dissection was carried down thru the subcutaneous tissue and platysma, bipolar cautery was used to maintain hemostasis. Dissection was then carried out thru an avascular plane leaving the sternocleidomastoid carotid artery and jugular vein laterally and the trachea and esophagus medially. The ventral aspect of the vertebral column was identified and we identified the C5-6 anterior cervical plate, and thereby identified the C4-5 and C6-7 intervertebral disc levels. The annulus at each level was incised and the disc space entered. Discectomy was performed with micro-curettes and pituitary rongeurs. The operating microscope was draped and  brought into the field provided additional magnification illumination and visualization. Discectomy was continued posteriorly thru the disc space and then the cartilaginous endplate was removed using micro-curettes along with the high-speed drill. Posterior osteophytic overgrowth was removed at each level using the high-speed drill along with a 2 mm thin footplated Kerrison punch. Posterior longitudinal ligament along with disc herniation was carefully removed, decompressing the spinal canal and thecal sac. We then continued to remove osteophytic overgrowth and disc material, decompressing the neural foramina and exiting nerve roots bilaterally. Once the decompression was completed hemostasis was established at each level with the use of Gelfoam with thrombin, Surgifoam, and bipolar cautery. The wound was irrigated and hemostasis confirmed. We then measured the height of each intravertebral disc space level and selected a 7 millimeter in height structural allograft for the C4-5 level and a 8 millimeter in height structural allograft for the C6-7 level. Each was hydrated in saline solution and then gently positioned in the intravertebral disc space and countersunk. We then selected a 20 millimeter in height Tether cervical plates for each level. They were positioned over each fusion construct and secured to the vertebra with rescue screws (4.5 mm) at the previous portals at C5 and C6, and standard (4.0 mm) variable screws at C4 and C7. Each new screw hole was started with the high-speed drill and then the screws placed, once all the screws were placed final tightening was performed. The wound was irrigated with bacitracin solution checked for hemostasis which was established and confirmed. An x-ray was taken which showed grafts in good position, the plate and screws in good position, and the overall alignment to be good . The wound is irrigated extensively with bacitracin solution, hemostasis was confirmed, and then  we proceeded with closure. The platysma was closed with interrupted inverted 2-0 undyed  Vicryl suture, the subcutaneous and subcuticular closed with interrupted inverted 3-0 undyed Vicryl suture. The skin edges were approximated with Dermabond. Following surgery the patient was taken out of cervical traction. To be reversed and the anesthetic and taken to the recovery room for further care.  PLAN OF CARE: Admit for overnight observation  PATIENT DISPOSITION:  PACU - hemodynamically stable.   Delay start of Pharmacological VTE agent (>24hrs) due to surgical blood loss or risk of bleeding:  yes

## 2012-02-17 NOTE — Anesthesia Procedure Notes (Signed)
Procedure Name: Intubation Date/Time: 02/17/2012 10:56 AM Performed by: Tyrone Nine Pre-anesthesia Checklist: Patient identified, Emergency Drugs available, Suction available, Patient being monitored and Timeout performed Patient Re-evaluated:Patient Re-evaluated prior to inductionOxygen Delivery Method: Circle system utilized Preoxygenation: Pre-oxygenation with 100% oxygen Intubation Type: IV induction Ventilation: Mask ventilation without difficulty Laryngoscope Size: Mac and 3 Grade View: Grade II Tube type: Oral Tube size: 7.5 mm Number of attempts: 1 Airway Equipment and Method: Stylet Placement Confirmation: ETT inserted through vocal cords under direct vision,  positive ETCO2,  CO2 detector and breath sounds checked- equal and bilateral Secured at: 21 cm Tube secured with: Tape Dental Injury: Teeth and Oropharynx as per pre-operative assessment

## 2012-02-17 NOTE — Anesthesia Postprocedure Evaluation (Signed)
Anesthesia Post Note  Patient: Kristen [name]  Procedure(s) Performed: Procedure(s) (LRB): ANTERIOR CERVICAL DECOMPRESSION/DISCECTOMY FUSION 2 LEVELS (N/A)  Anesthesia type: general  Patient location: PACU  Post pain: Pain level controlled  Post assessment: Patient's Cardiovascular Status Stable  Last Vitals:  Filed Vitals:   02/17/12 1416  BP: 135/80  Pulse: 103  Temp: 37.2 C  Resp: 17    Post vital signs: Reviewed and stable  Level of consciousness: sedated  Complications: No apparent anesthesia complications

## 2012-02-17 NOTE — Plan of Care (Signed)
Problem: Consults Goal: Diagnosis - Spinal Surgery Cervical Spine Fusion     

## 2012-02-17 NOTE — Discharge Summary (Signed)
Physician Discharge Summary  Patient ID: Kristen [name] MRN: [id number] DOB/AGE: [birth year] 42 y.o.  Admit date: 02/17/2012 Discharge date: 02/17/2012  Admission Diagnoses:  Cervical herniated disc, cervical spondylosis, cervical degenerative disease, cervical radiculopathy  Discharge Diagnoses:  Cervical herniated disc, cervical spondylosis, cervical degenerative disc disease, cervical radiculopathy  Discharged Condition: good  Hospital Course: Patient was admitted underwent removal of a C5-6 anterior cervical plate, and a E3-3 and C6-7 anterior cervical decompression and arthrodesis with allograft and tether cervical plating. Postoperatively she is done well. She is up and ambulating. She is taking well by mouth. She is voiding. Her wound is healing nicely. She is asking to be discharged home. I've given her instructions regarding wound care and activities. She is to followup with me in the office in 3 weeks.  Discharge Exam: Blood pressure 137/93, pulse 101, temperature 97.8 F (36.6 C), temperature source Oral, resp. rate 18, SpO2 96.00%.  Disposition: Home     Medication List     As of 02/17/2012  7:14 PM    TAKE these medications         acetaminophen 325 MG tablet   Commonly known as: TYLENOL   Take 650 mg by mouth every 6 (six) hours as needed. Headache/pain      ALPRAZolam 0.5 MG tablet   Commonly known as: XANAX   Take 0.5 mg by mouth at bedtime as needed.      ARIPiprazole 15 MG tablet   Commonly known as: ABILIFY   Take 15 mg by mouth daily.      buPROPion 150 MG 24 hr tablet   Commonly known as: WELLBUTRIN XL   Take 450 mg by mouth daily.      cetirizine 10 MG tablet   Commonly known as: ZYRTEC   Take 10 mg by mouth daily.      fluticasone 50 MCG/ACT nasal spray   Commonly known as: FLONASE   Place 2 sprays into the nose daily as needed. allergies      gemfibrozil 600 MG tablet   Commonly known as: LOPID   Take 600 mg by mouth 2 (two) times daily before  a meal.      HYDROcodone-acetaminophen 5-325 MG per tablet   Commonly known as: NORCO/VICODIN   Take 1-2 tablets by mouth every 4 (four) hours as needed.      ISOtretinoin 40 MG capsule   Commonly known as: ACCUTANE   Take 80 mg by mouth every morning. clavaris      lamoTRIgine 200 MG tablet   Commonly known as: LAMICTAL   Take 400 mg by mouth at bedtime.      lisdexamfetamine 70 MG capsule   Commonly known as: VYVANSE   Take 70 mg by mouth every morning.      meloxicam 7.5 MG tablet   Commonly known as: MOBIC   Take 7.5 mg by mouth 2 (two) times daily.      omega-3 acid ethyl esters 1 G capsule   Commonly known as: LOVAZA   Take 2 g by mouth every morning.      VIIBRYD 40 MG Tabs   Generic drug: Vilazodone HCl   Take 40 mg by mouth every morning.      zolpidem 10 MG tablet   Commonly known as: AMBIEN   Take 10 mg by mouth at bedtime.         Signed: Hewitt Shorts, MD 02/17/2012, 7:14 PM

## 2012-02-17 NOTE — Plan of Care (Signed)
Problem: Consults Goal: Diagnosis - Spinal Surgery Outcome: Completed/Met Date Met:  02/17/12 Cervical Spine Fusion

## 2012-02-17 NOTE — Preoperative (Addendum)
Beta Blockers   Reason not to administer Beta Blockers:Not Applicable 

## 2012-02-17 NOTE — Anesthesia Preprocedure Evaluation (Addendum)
Anesthesia Evaluation  Patient identified by MRN, date of birth, ID band Patient awake    Reviewed: Allergy & Precautions, H&P , NPO status , Patient's Chart, lab work & pertinent test results  History of Anesthesia Complications (+) PONV  Airway Mallampati: I TM Distance: >3 FB Neck ROM: Full    Dental  (+) Dental Advisory Given   Pulmonary former smoker,          Cardiovascular Exercise Tolerance: Good negative cardio ROS      Neuro/Psych PSYCHIATRIC DISORDERS Depression Bipolar Disorder Hospitalized 5 yrs ago for severe depression   GI/Hepatic Neg liver ROS, GERD-  Medicated and Controlled,  Endo/Other  Morbid obesity  Renal/GU negative Renal ROS     Musculoskeletal  (+) Arthritis -, Osteoarthritis,    Abdominal   Peds  Hematology   Anesthesia Other Findings   Reproductive/Obstetrics PMS but no period   Endometrial Ablation                         Anesthesia Physical Anesthesia Plan  ASA: II  Anesthesia Plan: General   Post-op Pain Management:    Induction: Intravenous  Airway Management Planned: Oral ETT  Additional Equipment:   Intra-op Plan:   Post-operative Plan: Extubation in OR  Informed Consent: I have reviewed the patients History and Physical, chart, labs and discussed the procedure including the risks, benefits and alternatives for the proposed anesthesia with the patient or authorized representative who has indicated his/her understanding and acceptance.   Dental advisory given  Plan Discussed with: Surgeon and CRNA  Anesthesia Plan Comments:        Anesthesia Quick Evaluation

## 2012-02-17 NOTE — Transfer of Care (Signed)
Immediate Anesthesia Transfer of Care Note  Patient: Kristen [name]  Procedure(s) Performed: Procedure(s) (LRB) with comments: ANTERIOR CERVICAL DECOMPRESSION/DISCECTOMY FUSION 2 LEVELS (N/A) - Cervical four-five and Cervical six-seven anterior cervial decompression with fusion plating and bonegraft  Patient Location: PACU  Anesthesia Type:General  Level of Consciousness: awake, alert , oriented and patient cooperative  Airway & Oxygen Therapy: Patient Spontanous Breathing and Patient connected to nasal cannula oxygen  Post-op Assessment: Report given to PACU RN and Post -op Vital signs reviewed and stable  Post vital signs: Reviewed and stable  Complications: No apparent anesthesia complications

## 2012-02-17 NOTE — H&P (Signed)
Subjective: Patient is a 42 y.o. female who is admitted for treatment of cervical disc herniations with underlying cervical spondylosis and degenerative disc disease at the C4-5 and C6-7 levels. She had a previous C5-6 ACDF in February 2003. Her complaints are of neck pain extending down to the left upper extremity, associated with paresthesias in the distal left upper extremity. She has used NSAIDS without significant relief. She is admitted now for removal of the plate at Y8-6, and a 2 level C4-5 and C6-7 ACDF.   Past Medical History  Diagnosis Date  . PONV (postoperative nausea and vomiting)   . Anxiety   . Mental disorder     bipolar  . Depression   . 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 GERD (gastroesophageal reflux disease)     occ    Past Surgical History  Procedure Date  . Cervical spine surgery 04  . Breast surgery 95    breast reduction   . Nasal sinus surgery   . Endometrial ablation   . Cesarean section     x2  . Tubal ligation   . Diagnostic laparoscopy     staph infection     Prescriptions prior to admission  Medication Sig Dispense Refill  . acetaminophen (TYLENOL) 325 MG tablet Take 650 mg by mouth every 6 (six) hours as needed. Headache/pain      . ALPRAZolam (XANAX) 0.5 MG tablet Take 0.5 mg by mouth at bedtime as needed.      . ARIPiprazole (ABILIFY) 15 MG tablet Take 15 mg by mouth daily.      Marland Kitchen buPROPion (WELLBUTRIN XL) 150 MG 24 hr tablet Take 450 mg by mouth daily.      . cetirizine (ZYRTEC) 10 MG tablet Take 10 mg by mouth daily.      . fluticasone (FLONASE) 50 MCG/ACT nasal spray Place 2 sprays into the nose daily as needed. allergies      . gemfibrozil (LOPID) 600 MG tablet Take 600 mg by mouth 2 (two) times daily before a meal.      . ISOtretinoin (ACCUTANE) 40 MG capsule Take 80 mg by mouth every morning. clavaris      . lamoTRIgine (LAMICTAL) 200 MG tablet Take 400 mg by mouth at bedtime.      Marland Kitchen lisdexamfetamine (VYVANSE) 70 MG capsule Take 70 mg by  mouth every morning.      . meloxicam (MOBIC) 7.5 MG tablet Take 7.5 mg by mouth 2 (two) times daily.      Marland Kitchen omega-3 acid ethyl esters (LOVAZA) 1 G capsule Take 2 g by mouth every morning.      . Vilazodone HCl (VIIBRYD) 40 MG TABS Take 40 mg by mouth every morning.      . zolpidem (AMBIEN) 10 MG tablet Take 10 mg by mouth at bedtime.       Allergies  Allergen Reactions  . Ciprofloxacin Hives  . Iohexol      Desc: IVP DYE     History  Substance Use Topics  . Smoking status: Former Smoker -- 1.0 packs/day for 5 years    Quit date: 02/09/2002  . Smokeless tobacco: Not on file  . Alcohol Use: No    History reviewed. No pertinent family history.   Review of Systems A comprehensive review of systems was negative.  Objective: Vital signs in last 24 hours: Temp:  [98 F (36.7 C)] 98 F (36.7 C) (01/06 0732) Pulse Rate:  [83] 83  (01/06 0732) Resp:  [  20] 20  (01/06 0732) BP: (124)/(84) 124/84 mmHg (01/06 0732) SpO2:  [98 %] 98 % (01/06 0732)  EXAM: Patient is a well-developed well-nourished white female in no acute distress. Lungs are clear to auscultation , the patient has symmetrical respiratory excursion. Heart has a regular rate and rhythm normal S1 and S2 no murmur.   Abdomen is soft nontender nondistended bowel sounds are present. Extremity examination shows no clubbing cyanosis or edema. Musculoskeletal examination shows tenderness to palpation in the left paracervical musculature, and none in the right paracervical musculature. No tenderness over the cervical spinous processes. Range of motion neck is somewhat limited in lateral rotation and extension, but she is able to flex forward fairly well. Motor examination shows 5 over 5 strength in the upper extremities including the deltoid biceps triceps and intrinsics and grip. Sensation is intact to pinprick throughout the digits of the upper extremities. Reflexes are symmetrical and without evidence of pathologic reflexes. Patient  has a normal gait and stance.    Data Review:CBC    Component Value Date/Time   WBC 11.7* 02/10/2012 0930   RBC 4.71 02/10/2012 0930   HGB 13.5 02/10/2012 0930   HCT 40.5 02/10/2012 0930   PLT 472* 02/10/2012 0930   MCV 86.0 02/10/2012 0930   MCH 28.7 02/10/2012 0930   MCHC 33.3 02/10/2012 0930   RDW 13.8 02/10/2012 0930   LYMPHSABS 2.2 10/31/2008 0510   MONOABS 0.9 10/31/2008 0510   EOSABS 0.3 10/31/2008 0510   BASOSABS 0.0 10/31/2008 0510                          BMET    Component Value Date/Time   NA 140 02/10/2012 0930   K 4.0 02/10/2012 0930   CL 103 02/10/2012 0930   CO2 23 02/10/2012 0930   GLUCOSE 95 02/10/2012 0930   BUN 14 02/10/2012 0930   CREATININE 0.64 02/10/2012 0930   CALCIUM 9.8 02/10/2012 0930   GFRNONAA >90 02/10/2012 0930   GFRAA >90 02/10/2012 0930     Assessment/Plan: Patient with neck and left cervical radicular pain and paresthesias, status post a C5-6 ACDF in February 2003, now admitted for a 2 level C4-5 and C6-7 ACDF, that will require removal of the previous plate at Z6-1.  I've discussed with the patient the nature of his condition, the nature the surgical procedure, the typical length of surgery, hospital stay, and overall recuperation. We discussed limitations postoperatively. I discussed risks of surgery including risks of infection, bleeding, possibly need for transfusion, the risk of nerve root dysfunction with pain, weakness, numbness, or paresthesias, the risk of spinal cord dysfunction with paralysis of all 4 limbs and quadriplegia, and the risk of dural tear and CSF leakage and possible need for further surgery, the risk of esophageal dysfunction causing dysphagia and the risk of laryngeal dysfunction causing hoarseness of the voice, the risk of failure of the arthrodesis and the possible need for further surgery, and the risk of anesthetic complications including myocardial infarction, stroke, pneumonia, and death. We also discussed the need  for postoperative immobilization in a cervical collar. Understanding all this the patient does wish to proceed with surgery and is admitted for such.    Hewitt Shorts, MD 02/17/2012 10:39 AM

## 2012-02-17 NOTE — Progress Notes (Signed)
Pt d/c via wc. No c/o pain. No s/s of infection.D/C instructions & F/U appt given and pt verbalized understanding of instructions given

## 2012-02-17 NOTE — Anesthesia Postprocedure Evaluation (Signed)
Anesthesia Post Note  Patient: Kristen [name]  Procedure(s) Performed: Procedure(s) (LRB): ANTERIOR CERVICAL DECOMPRESSION/DISCECTOMY FUSION 2 LEVELS (N/A)  Anesthesia type: general  Patient location: PACU  Post pain: Pain level controlled  Post assessment: Patient's Cardiovascular Status Stable  Last Vitals:  Filed Vitals:   02/17/12 0732  BP: 124/84  Pulse: 83  Temp: 36.7 C  Resp: 20    Post vital signs: Reviewed and stable  Level of consciousness: sedated  Complications: No apparent anesthesia complications

## 2012-02-18 ENCOUNTER — Encounter (HOSPITAL_COMMUNITY): Payer: Self-pay | Admitting: Neurosurgery

## 2012-03-28 ENCOUNTER — Other Ambulatory Visit: Payer: Self-pay

## 2012-07-10 ENCOUNTER — Encounter: Payer: Self-pay | Admitting: Internal Medicine

## 2012-07-10 ENCOUNTER — Ambulatory Visit (INDEPENDENT_AMBULATORY_CARE_PROVIDER_SITE_OTHER): Payer: Managed Care, Other (non HMO) | Admitting: Internal Medicine

## 2012-07-10 VITALS — BP 120/82 | HR 109 | Ht 66.0 in | Wt 222.2 lb

## 2012-07-10 DIAGNOSIS — G4733 Obstructive sleep apnea (adult) (pediatric): Secondary | ICD-10-CM

## 2012-07-10 DIAGNOSIS — J310 Chronic rhinitis: Secondary | ICD-10-CM

## 2012-07-10 NOTE — Progress Notes (Signed)
51 yoF former smoker self-referred for sleep medicine evaluation.  PCP Dr Neita Carp Self referral-wakes self up during naps snoring; tired through the day. Chronic nasal congestion with hx nasal surgery years ago. Very loud snoring. Daytime sleepiness. Husband the bedroom. Multiple medications with effect on sleep/ alertness.  Bedtime 9 or 10 PM, very short sleep latency, not aware of waking during the night or up at 6:45 AM. Weight gain 40 pounds in past years. Still has her tonsils. History of ENT surgery by Dr. Annalee Genta 15 years ago. Many episodes of sinusitis. Allergy skin testing in the past started allergy vaccine but never reached maintenance because of too many local reactions attributes rhinitis with Zyrtec and Flonase. Denies history of heart or lung disease, hypertension or thyroid disease. 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 Family history that father snores.  Prior to Admission medications   Medication Sig Start Date End Date Taking? Authorizing Provider  acetaminophen (TYLENOL) 325 MG tablet Take 650 mg by mouth every 6 (six) hours as needed. Headache/pain   Yes Historical Provider, MD  ALPRAZolam Prudy Feeler) 0.5 MG tablet Take 0.5 mg by mouth at bedtime as needed.   Yes Historical Provider, MD  ARIPiprazole (ABILIFY) 20 MG tablet Take 20 mg by mouth daily.   Yes Historical Provider, MD  buPROPion (WELLBUTRIN XL) 150 MG 24 hr tablet Take 3 tablets by mouth QD   Yes Historical Provider, MD  cetirizine (ZYRTEC) 10 MG tablet Take 10 mg by mouth daily.   Yes Historical Provider, MD  fluticasone (FLONASE) 50 MCG/ACT nasal spray Place 2 sprays into the nose daily as needed. allergies   Yes Historical Provider, MD  gemfibrozil (LOPID) 600 MG tablet Take 600 mg by mouth 2 (two) times daily before a meal.   Yes Historical Provider, MD  ISOtretinoin (ACCUTANE) 40 MG capsule Take 80 mg by mouth every morning. clavaris   Yes Historical Provider, MD  lamoTRIgine (LAMICTAL) 200 MG  tablet Take 400 mg by mouth at bedtime.   Yes Historical Provider, MD  lisdexamfetamine (VYVANSE) 70 MG capsule Take 70 mg by mouth every morning.   Yes Historical Provider, MD  meloxicam (MOBIC) 7.5 MG tablet Take 7.5 mg by mouth 2 (two) times daily.   Yes Historical Provider, MD  omega-3 acid ethyl esters (LOVAZA) 1 G capsule Take 2 g by mouth every morning.   Yes Historical Provider, MD  Vilazodone HCl (VIIBRYD) 40 MG TABS Take 40 mg by mouth every morning.   Yes Historical Provider, MD  zolpidem (AMBIEN) 10 MG tablet Take 10 mg by mouth at bedtime.   Yes Historical Provider, MD   Past Medical History  Diagnosis Date  . PONV (postoperative nausea and vomiting)   . Anxiety   . Mental disorder     bipolar  . Depression   . 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 GERD (gastroesophageal reflux disease)     occ   Past Surgical History  Procedure Laterality Date  . Cervical spine surgery  04  . Breast surgery  95    breast reduction   . Nasal sinus surgery    . Endometrial ablation    . Cesarean section      x2  . Tubal ligation    . Diagnostic laparoscopy      staph infection   . Anterior cervical decomp/discectomy fusion  02/17/2012    Procedure: ANTERIOR CERVICAL DECOMPRESSION/DISCECTOMY FUSION 2 LEVELS;  Surgeon: Hewitt Shorts, MD;  Location: [facility];  Service: Neurosurgery;  Laterality: N/A;  Cervical four-five and Cervical six-seven anterior cervial decompression with fusion plating and bonegraft   Family History  Problem Relation Age of Onset  . Allergies      entire family(mom and dad)  . Asthma Mother   . Rheum arthritis Mother   . Non-Hodgkin's lymphoma Mother    History  Substance Use Topics  . Smoking status: Former Smoker -- 1.00 packs/day for 5 years    Quit date: 02/09/2002  . Smokeless tobacco: Not on file  . Alcohol Use: No   ROS-see HPI Constitutional:   No-   weight loss, night sweats, fevers, chills, +fatigue, lassitude. HEENT:   No-  headaches,  difficulty swallowing, tooth/dental problems, sore throat,       No-  sneezing, itching, ear ache, nasal congestion, post nasal drip,  CV:  No-   chest pain, orthopnea, PND, swelling in lower extremities, anasarca,                                  dizziness, palpitations Resp: No-   shortness of breath with exertion or at rest.              No-   productive cough,  No non-productive cough,  No- coughing up of blood.              No-   change in color of mucus.  No- wheezing.   Skin: No-   rash or lesions. GI:  No-   heartburn, indigestion, abdominal pain, nausea, vomiting, diarrhea,                 change in bowel habits, loss of appetite GU: No-   dysuria, change in color of urine, no urgency or frequency.  No- flank pain. MS:  No-   joint pain or swelling.  No- decreased range of motion.  No- back pain. Neuro-     nothing unusual Psych:  No- change in mood or affect. No depression or anxiety.  No memory loss.  OBJ- Physical Exam General- Alert, Oriented, Affect-appropriate, Distress- none acute. Overweight Skin- rash-none, lesions- none, excoriation- none Lymphadenopathy- none Head- atraumatic            Eyes- Gross vision intact, PERRLA, conjunctivae and secretions clear            Ears- Hearing, canals-normal            Nose- +stuffy turbinate edema, no-Septal dev, mucus, polyps, erosion, perforation             Throat- Mallampati II , mucosa clear , drainage- none, tonsils- atrophic Neck- flexible , trachea midline, no stridor , thyroid nl, carotid no bruit Chest - symmetrical excursion , unlabored           Heart/CV- RRR , no murmur , no gallop  , no rub, nl s1 s2                           - JVD- none , edema- none, stasis changes- none, varices- none           Lung- clear to P&A, wheeze- none, cough- none , dullness-none, rub- none           Chest wall-  Abd- tender-no, distended-no, bowel sounds-present, HSM- no Br/ Gen/ Rectal- Not done, not indicated Extrem- cyanosis- none,  clubbing, none, atrophy- none, strength-  nl Neuro- grossly intact to observation

## 2012-07-10 NOTE — Patient Instructions (Addendum)
Order- Schedule NPSG Split protocol      Dx OSA  Consider trying Breathe Right nasal strips

## 2012-07-20 DIAGNOSIS — G479 Sleep disorder, unspecified: Secondary | ICD-10-CM | POA: Insufficient documentation

## 2012-07-20 DIAGNOSIS — J310 Chronic rhinitis: Secondary | ICD-10-CM | POA: Insufficient documentation

## 2012-07-20 NOTE — Assessment & Plan Note (Signed)
Strongly suggesting history and physical exam. Plan-schedule sleep study. Meanwhile try nasal strips. Encourage weight loss.

## 2012-07-20 NOTE — Assessment & Plan Note (Signed)
Nonspecific, possibly mixed allergic and nonallergic rhinitis. Plan-continue antihistamines and nasal steroid.

## 2012-08-05 ENCOUNTER — Ambulatory Visit (HOSPITAL_BASED_OUTPATIENT_CLINIC_OR_DEPARTMENT_OTHER): Payer: Managed Care, Other (non HMO) | Attending: Internal Medicine | Admitting: Radiology

## 2012-08-05 VITALS — Ht 66.0 in | Wt 222.0 lb

## 2012-08-05 DIAGNOSIS — G4710 Hypersomnia, unspecified: Secondary | ICD-10-CM | POA: Insufficient documentation

## 2012-08-05 DIAGNOSIS — G4733 Obstructive sleep apnea (adult) (pediatric): Secondary | ICD-10-CM

## 2012-08-09 DIAGNOSIS — R0609 Other forms of dyspnea: Secondary | ICD-10-CM

## 2012-08-09 DIAGNOSIS — G4710 Hypersomnia, unspecified: Secondary | ICD-10-CM

## 2012-08-09 DIAGNOSIS — G4730 Sleep apnea, unspecified: Secondary | ICD-10-CM

## 2012-08-09 DIAGNOSIS — R0989 Other specified symptoms and signs involving the circulatory and respiratory systems: Secondary | ICD-10-CM

## 2012-08-09 NOTE — Procedures (Signed)
Kristen [name], Kristen [name]              ACCOUNT NO.:  [id number]  MEDICAL RECORD NO.:  [id number]          PATIENT TYPE:  OUT  LOCATION:  SLEEP CENTER                 FACILITY:  [facility]  PHYSICIAN:  Roberth Berling D. Maple Hudson, MD, FCCP, FACPDATE OF BIRTH:  1970/12/28  DATE OF STUDY:  08/05/2012                           NOCTURNAL POLYSOMNOGRAM  REFERRING PHYSICIAN:  Yuliet Needs D. Maple Hudson, MD, FCCP, FACP  REFERRING PHYSICIAN:  Rebekah Sprinkle D. Jahlani Lorentz, MD, FCCP, FACP  INDICATION FOR STUDY:  Hypersomnia with sleep apnea.  EPWORTH SLEEPINESS SCORE:  11/24.  BMI 35.8, weight 222 pounds, height 66 inches, neck 16 inches.  MEDICATIONS:  Home medications charted for review.  SLEEP ARCHITECTURE:  Total sleep time 337 minutes with sleep efficiency 81.8%.  Stage I was 15.9%, stage II 74%, stage III absent, REM 10.1% of total sleep time.  Sleep latency 29 minutes, REM latency 297 minutes. Awake after sleep onset 48 minutes.  Arousal index 21.2.  BEDTIME MEDICATIONS:  Gemfibrozil, lamotrigine, Ambien.  RESPIRATORY DATA:  Apnea-hypopnea index (AHI) 3.9 per hour.  A total of 22 events was scored including 5 obstructive apneas, 3 central apneas, 14 hypopneas.  All events were associated with supine sleep position or with REM.  REM AHI 17.6 per hour.  There were not enough events to qualify for split protocol CPAP.  OXYGEN DATA:  Moderate to loud snoring with oxygen desaturation to a nadir of 88% and mean oxygen saturation through the study of 93.6% on room air.  CARDIAC DATA:  Sinus rhythm with rare PAC.  MOVEMENT-PARASOMNIA:  No significant movement disturbance.  No bathroom trips.  IMPRESSIONS-RECOMMENDATIONS: 1. Sleep architecture was significant for frequent brief awakenings     through the study despite bedtime medications as listed above,     including Ambien. 2. Occasional respiratory events with sleep disturbance, within normal     limits.  AHI 3.9 per hour (adult normal     range is from 0-5 events per  hour).  Moderate to loud snoring with     oxygen desaturation to a nadir of 88% and mean oxygen saturation     through the study of 93.6% on room air.     Kristen [name] D. Maple Hudson, MD, [facility], FACP Diplomate, American Board of Sleep Medicine    CDY/MEDQ  D:  08/09/2012 15:42:11  T:  08/09/2012 20:06:05  Job:  [id number]

## 2012-08-31 ENCOUNTER — Encounter: Payer: Self-pay | Admitting: Internal Medicine

## 2012-08-31 ENCOUNTER — Ambulatory Visit (INDEPENDENT_AMBULATORY_CARE_PROVIDER_SITE_OTHER): Payer: 59 | Admitting: Internal Medicine

## 2012-08-31 VITALS — BP 130/90 | HR 94 | Ht 66.5 in | Wt 223.0 lb

## 2012-08-31 DIAGNOSIS — G479 Sleep disorder, unspecified: Secondary | ICD-10-CM

## 2012-08-31 DIAGNOSIS — J310 Chronic rhinitis: Secondary | ICD-10-CM

## 2012-08-31 MED ORDER — ARMODAFINIL 150 MG PO TABS: 150.0000 mg | ORAL_TABLET | Freq: Every day | ORAL | Status: DC

## 2012-08-31 MED ORDER — ZALEPLON 10 MG PO CAPS: ORAL_CAPSULE | ORAL | Status: DC

## 2012-08-31 MED ORDER — AZELASTINE-FLUTICASONE 137-50 MCG/ACT NA SUSP: 1.0000 | Freq: Every day | NASAL | Status: DC

## 2012-08-31 NOTE — Progress Notes (Signed)
42 yoF former smoker self-referred for sleep medicine evaluation.  PCP Dr Neita Carp Self referral-wakes self up during naps snoring; tired through the day. Chronic nasal congestion with hx nasal surgery years ago. Very loud snoring. Daytime sleepiness. Husband the bedroom. Multiple medications with effect on sleep/ alertness.  Bedtime 9 or 10 PM, very short sleep latency, not aware of waking during the night or up at 6:45 AM. Weight gain 40 pounds in past years. Still has her tonsils. History of ENT surgery by Dr. Annalee Genta 15 years ago. Many episodes of sinusitis. Allergy skin testing in the past started allergy vaccine but never reached maintenance because of too many local reactions attributes rhinitis with Zyrtec and Flonase. Denies history of heart or lung disease, hypertension or thyroid disease. 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 Family history that father snores.  08/31/12- 2 yoF former smoker self-referred for sleep medicine evaluation, allergic rhinitis.  PCP Dr Neita Carp FOLLOWS FOR- results of sleep study needed-- denies any other concerns at this time Wakes with nasal congestion and eyes itching. Main C/O is daytime sleepiness. Ambien at bedtime. Slept 14 hours yesterday. Caffeine little effect. Not noticing cataplexy or sleep aparalysis. NPSG 08/05/12- fragmented with frequent brief awakenings, non-specific, despite ambien. AHI 3.9/ hr, moderate snoring, desat to 88%. Weight was 222 lbs.   ROS-see HPI Constitutional:   No-   weight loss, night sweats, fevers, chills, +fatigue, lassitude. HEENT:   No-  headaches, difficulty swallowing, tooth/dental problems, sore throat,       No-  sneezing, +itching, ear ache,+ nasal congestion, post nasal drip,  CV:  No-   chest pain, orthopnea, PND, swelling in lower extremities, anasarca, dizziness, palpitations Resp: No-   shortness of breath with exertion or at rest.              No-   productive cough,  No non-productive cough,   No- coughing up of blood.              No-   change in color of mucus.  No- wheezing.   Skin: No-   rash or lesions. GI:  No-   heartburn, indigestion, abdominal pain, nausea, vomiting, GU:  MS:  No-   joint pain or swelling.   Neuro-     nothing unusual Psych:  No- change in mood or affect. No depression or anxiety.  No memory loss.  OBJ- Physical Exam General- Alert, Oriented, Affect-appropriate, Distress- none acute. Overweight Skin- rash-none, lesions- none, excoriation- none Lymphadenopathy- none Head- atraumatic            Eyes- Gross vision intact, PERRLA, conjunctivae and secretions clear            Ears- Hearing, canals-normal            Nose- +stuffy turbinate edema, no-Septal dev, mucus, polyps, erosion, perforation             Throat- Mallampati II , mucosa clear , drainage- none, tonsils- atrophic Neck- flexible , trachea midline, no stridor , thyroid nl, carotid no bruit Chest - symmetrical excursion , unlabored           Heart/CV- RRR , no murmur , no gallop  , no rub, nl s1 s2                           - JVD- none , edema- none, stasis changes- none, varices- none  Lung- clear to P&A, wheeze- none, cough- none , dullness-none, rub- none           Chest wall-  Abd-  Br/ Gen/ Rectal- Not done, not indicated Extrem- cyanosis- none, clubbing, none, atrophy- none, strength- nl Neuro- grossly intact to observation

## 2012-08-31 NOTE — Patient Instructions (Addendum)
For one week, instead of Ambien try generic Sonata- script written. See if you feel less tired next day.  Sample Dymista nasal spray    1-2 puffs each nostril once daily at bedtime. This is instead of flonase.  Samples Nuvigil 150 mg.   Wait until you have finished the Sonata trial. Then start Nuvigil once each morning as needed for alertness.

## 2012-09-07 ENCOUNTER — Telehealth: Payer: Self-pay | Admitting: Internal Medicine

## 2012-09-07 MED ORDER — ZALEPLON 10 MG PO CAPS: ORAL_CAPSULE | ORAL | Status: DC

## 2012-09-07 NOTE — Telephone Encounter (Signed)
Ok to extend the sonata same strength, # 30, 1 for sleep as needed, refill x 5

## 2012-09-07 NOTE — Telephone Encounter (Signed)
Refill called in and pt is aware. Congetta Odriscoll, CMA  

## 2012-09-07 NOTE — Telephone Encounter (Signed)
Last OV 08/31/12. I spoke with the pt and she states she has taken the sonata in place of the [place] and she states with the sonata it has taken her a little longer to fall asleep but she feels much better in the mornings then when she took the [place] and would like to continue the sonata. Please advise if ok to send in rx. Kristen [name], CMA Allergies  Allergen Reactions  . Ciprofloxacin Hives  . Iohexol      Desc: IVP DYE

## 2012-09-09 ENCOUNTER — Telehealth: Payer: Self-pay | Admitting: Internal Medicine

## 2012-09-09 NOTE — Telephone Encounter (Signed)
Ok to send script nuvigil 150, # 30, 1 daily if needed, refill x 5  Ok to try PA

## 2012-09-09 NOTE — Telephone Encounter (Signed)
CY please advise if you are okay with RX being sent and also doing PA for this medication. Thanks.

## 2012-09-09 NOTE — Assessment & Plan Note (Signed)
Plan- try Dymista - discussed

## 2012-09-09 NOTE — Assessment & Plan Note (Addendum)
NPSG 08/05/12- fragmented with frequent brief awakenings, non-specific, despite ambien. AHI 3.9/ hr, moderate snoring, desat to 88%. Weight was 222 lbs Looks c/w OSA, but not confirmed by sleep study. Discussed sleep hygiene, asked about cataplexy and other clues.  Discussed potential for medication induced hypersomnia.  Plan- try shorter half life sleep med instead of ambien, to avoid any drug carry over, while trying to improve sleep quality at night. Then return to [place] and try samples Nuvigil. Consider ordering MSLT.

## 2012-09-11 ENCOUNTER — Telehealth: Payer: Self-pay | Admitting: Internal Medicine

## 2012-09-11 MED ORDER — ARMODAFINIL 150 MG PO TABS: 150.0000 mg | ORAL_TABLET | Freq: Every day | ORAL | Status: DC | PRN

## 2012-09-11 NOTE — Telephone Encounter (Signed)
Waymon Budge, MD at 09/09/2012 3:55 PM   Status: Signed            Ok to send script nuvigil 150, # 30, 1 daily if needed, refill x 5 Ok to try PA  ---  rx was never sent. I called [facility] to call this in. I was advised it takes about 15-20 minutes before they will know if it needs a PA and was advised [facility] will do so

## 2012-09-14 NOTE — Telephone Encounter (Signed)
[employer], spoke with Delaney Meigs.  Was advised rx has already been picked up from pt.  No PA is needed on medication at this time. Called, spoke with pt.  Informed her of this.  She verbalized understanding and voiced no further questions or concerns at this time.

## 2012-10-14 ENCOUNTER — Other Ambulatory Visit: Payer: Self-pay | Admitting: Internal Medicine

## 2012-10-14 NOTE — Telephone Encounter (Signed)
Please advise if okay to refill. Thanks.  

## 2012-10-14 NOTE — Telephone Encounter (Signed)
Ok to refill 

## 2012-10-19 ENCOUNTER — Telehealth: Payer: Self-pay | Admitting: Internal Medicine

## 2012-10-19 NOTE — Telephone Encounter (Signed)
Spoke with patient-- despite telephone msg from 09/11/12 patient in fact does need a PA for [employer] [phone] and PA has been initiated --- Member ID: [id number] Approval/Denial will be faxed to office in 24-48 hr Will forward to Katie so that she is aware and be on look out for fax

## 2012-10-19 NOTE — Telephone Encounter (Signed)
LMOMTCB x1 for pt 

## 2012-10-19 NOTE — Telephone Encounter (Signed)
Patient returning call.

## 2012-10-20 ENCOUNTER — Telehealth: Payer: Self-pay | Admitting: Internal Medicine

## 2012-10-20 NOTE — Telephone Encounter (Signed)
LMTCB

## 2012-10-22 ENCOUNTER — Ambulatory Visit: Payer: 59 | Admitting: Internal Medicine

## 2012-10-22 NOTE — Telephone Encounter (Signed)
Noted  

## 2012-10-22 NOTE — Telephone Encounter (Signed)
I spoke with Kristen [name] and she stated she can't come off the medication for 3 days bc she is bipolar. She reports she just won't take the nuvigil. I advised will forward to Dr. Maple Hudson as an Lorain Childes

## 2012-10-22 NOTE — Telephone Encounter (Signed)
Per CY-"I have no data to offer insurance company to support coverage of Nuvigil. We would need to do a MLST daytime at sleep center, off Adderall, Wellbutrin, Xanax, and Zyrtec x 3 days, to show significant disorder of daytime sleepiness.    Please Note: she has cancelled her follow up appointment.

## 2012-10-22 NOTE — Telephone Encounter (Signed)
See phone note 10-20-12

## 2012-10-22 NOTE — Telephone Encounter (Signed)
Spoke with patient, states she called her insurance company to see what the delay was on the PA for her Nuvigil 150mg  Daily PRN Patient states the insurance rep advised that she would need to send a letter of medical necessity as to why she is needing this medication and also a copy of her sleep study is needed.  Spoke with Windy Fast in the PA dept and he stated that in fact this is correct. PA was initially denied and to have it appealed the sleep study and letter of medical necessity would need to be faxed to number below. Dr. Maple Hudson please advise, thank you!  [phone]-- PA number [phone]-- PA Appeals Fax Number  Last OV: 08/31/12 w 6 wk f/u not scheduled at this time

## 2012-12-17 ENCOUNTER — Other Ambulatory Visit: Payer: Self-pay

## 2013-07-13 ENCOUNTER — Other Ambulatory Visit: Payer: Self-pay | Admitting: Neurosurgery

## 2013-07-13 DIAGNOSIS — S129XXA Fracture of neck, unspecified, initial encounter: Secondary | ICD-10-CM

## 2013-07-16 ENCOUNTER — Ambulatory Visit
Admission: RE | Admit: 2013-07-16 | Discharge: 2013-07-16 | Disposition: A | Discharge status: Home / Self Care | Payer: Medicare Other | Source: Ambulatory Visit | Attending: Neurosurgery | Admitting: Neurosurgery

## 2013-07-16 DIAGNOSIS — S129XXA Fracture of neck, unspecified, initial encounter: Secondary | ICD-10-CM

## 2013-07-22 ENCOUNTER — Other Ambulatory Visit: Payer: Self-pay | Admitting: Neurosurgery

## 2013-08-06 NOTE — Pre-Procedure Instructions (Signed)
Kristen [name]  08/06/2013   Your procedure is scheduled on:  Wednesday, July 8th  Report to [facility] at 0630 AM.  Call this number if you have problems the morning of surgery: [phone]   Remember:   Do not eat food or drink liquids after midnight.   Take these medicines the morning of surgery with A SIP OF WATER: wellbutrin, flonase, zyrtec, risperdal, tylenol if needed   Do not wear jewelry, make-up or nail polish.  Do not wear lotions, powders, or perfumes. You may wear deodorant.  Do not shave 48 hours prior to surgery. Men may shave face and neck.  Do not bring valuables to the hospital.  [facility] is not responsible  for any belongings or valuables.               Contacts, dentures or bridgework may not be worn into surgery.  Leave suitcase in the car. After surgery it may be brought to your room.  For patients admitted to the hospital, discharge time is determined by your treatment team.               Patients discharged the day of surgery will not be allowed to drive home.  Please read over the following fact sheets that you were given: Pain Booklet, Coughing and Deep Breathing, MRSA Information and Surgical Site Infection Prevention [place] - Preparing for Surgery  Before surgery, you can play an important role.  Because skin is not sterile, your skin needs to be as free of germs as possible.  You can reduce the number of germs on you skin by washing with CHG (chlorahexidine gluconate) soap before surgery.  CHG is an antiseptic cleaner which kills germs and bonds with the skin to continue killing germs even after washing.  Please DO NOT use if you have an allergy to CHG or antibacterial soaps.  If your skin becomes reddened/irritated stop using the CHG and inform your nurse when you arrive at Short Stay.  Do not shave (including legs and underarms) for at least 48 hours prior to the first CHG shower.  You may shave your face.  Please follow  these instructions carefully:   1.  Shower with CHG Soap the night before surgery and the morning of Surgery.  2.  If you choose to wash your hair, wash your hair first as usual with your normal shampoo.  3.  After you shampoo, rinse your hair and body thoroughly to remove the shampoo.  4.  Use CHG as you would any other liquid soap.  You can apply CHG directly to the skin and wash gently with scrungie or a clean washcloth.  5.  Apply the CHG Soap to your body ONLY FROM THE NECK DOWN.  Do not use on open wounds or open sores.  Avoid contact with your eyes, ears, mouth and genitals (private parts).  Wash genitals (private parts) with your normal soap.  6.  Wash thoroughly, paying special attention to the area where your surgery will be performed.  7.  Thoroughly rinse your body with warm water from the neck down.  8.  DO NOT shower/wash with your normal soap after using and rinsing off the CHG Soap.  9.  Pat yourself dry with a clean towel.            10.  Wear clean pajamas.            11.  Place clean sheets on your  bed the night of your first shower and do not sleep with pets.  Day of Surgery  Do not apply any lotions/deoderants the morning of surgery.  Please wear clean clothes to the hospital/surgery center.

## 2013-08-09 ENCOUNTER — Encounter (HOSPITAL_COMMUNITY): Payer: Self-pay

## 2013-08-09 ENCOUNTER — Encounter (HOSPITAL_COMMUNITY): Payer: Self-pay | Admitting: Pharmacy Technician

## 2013-08-09 ENCOUNTER — Encounter (HOSPITAL_COMMUNITY)
Admission: RE | Admit: 2013-08-09 | Discharge: 2013-08-09 | Disposition: A | Discharge status: Home / Self Care | Payer: 59 | Source: Ambulatory Visit | Attending: Neurosurgery | Admitting: Neurosurgery

## 2013-08-09 DIAGNOSIS — Z01812 Encounter for preprocedural laboratory examination: Secondary | ICD-10-CM | POA: Insufficient documentation

## 2013-08-09 HISTORY — DX: Unspecified osteoarthritis, unspecified site: M19.90

## 2013-08-09 HISTORY — DX: Bipolar disorder, unspecified: F31.9

## 2013-08-09 HISTORY — DX: Hyperlipidemia, unspecified: E78.5

## 2013-08-09 LAB — BASIC METABOLIC PANEL
BUN: 12 mg/dL (ref 6–23)
CO2: 25 mEq/L (ref 19–32)
Calcium: 10 mg/dL (ref 8.4–10.5)
Chloride: 98 mEq/L (ref 96–112)
Creatinine, Ser: 0.76 mg/dL (ref 0.50–1.10)
GFR calc Af Amer: >90 mL/min (ref >90)
GFR calc non Af Amer: >90 mL/min (ref >90)
Glucose, Bld: 141 mg/dL — ABNORMAL HIGH (ref 70–99)
Potassium: 4 mEq/L (ref 3.7–5.3)
Sodium: 139 mEq/L (ref 137–147)

## 2013-08-09 LAB — CBC
HCT: 38.5 % (ref 36.0–46.0)
Hemoglobin: 12.6 g/dL (ref 12.0–15.0)
MCH: 28.5 pg (ref 26.0–34.0)
MCHC: 32.7 g/dL (ref 30.0–36.0)
MCV: 87.1 fL (ref 78.0–100.0)
Platelets: 407 10*3/uL — ABNORMAL HIGH (ref 150–400)
RBC: 4.42 MIL/uL (ref 3.87–5.11)
RDW: 13.8 % (ref 11.5–15.5)
WBC: 9.9 10*3/uL (ref 4.0–10.5)

## 2013-08-09 LAB — SURGICAL PCR SCREEN
MRSA, PCR: NEGATIVE
Staphylococcus aureus: POSITIVE — AB

## 2013-08-09 NOTE — Progress Notes (Signed)
Primary - dr. Quintin Alto - Kristen [name] Does not have cardiologist No prior cardiac testing

## 2013-08-09 NOTE — Progress Notes (Signed)
08/09/13 1258  OBSTRUCTIVE SLEEP APNEA  Have you ever been diagnosed with sleep apnea through a sleep study? No  Do you snore loudly (loud enough to be heard through closed doors)?  1  Do you often feel tired, fatigued, or sleepy during the daytime? 0  Has anyone observed you stop breathing during your sleep? 1  Do you have, or are you being treated for high blood pressure? 0  BMI more than 35 kg/m2? 1  Age over 43 years old? 0  Neck circumference greater than 40 cm/16 inches? 1  Gender: 0  Obstructive Sleep Apnea Score 4  Score 4 or greater  Results sent to PCP

## 2013-08-09 NOTE — Progress Notes (Signed)
Called in rx mupirocin ointment to [facility] in [place]

## 2013-08-17 MED ORDER — CEFAZOLIN SODIUM-DEXTROSE 2-3 GM-% IV SOLR
2.0000 g | INTRAVENOUS | Status: AC
  Administered 2013-08-18: 2 g via INTRAVENOUS
  Filled 2013-08-17: qty 50

## 2013-08-18 ENCOUNTER — Encounter (HOSPITAL_COMMUNITY): Payer: Self-pay | Admitting: Anesthesiology

## 2013-08-18 ENCOUNTER — Inpatient Hospital Stay (HOSPITAL_COMMUNITY): Payer: 59

## 2013-08-18 ENCOUNTER — Inpatient Hospital Stay (HOSPITAL_COMMUNITY): Payer: 59 | Admitting: Anesthesiology

## 2013-08-18 ENCOUNTER — Encounter (HOSPITAL_COMMUNITY)
Admission: RE | Disposition: A | Discharge status: Home / Self Care | Payer: Self-pay | Source: Ambulatory Visit | Attending: Neurosurgery

## 2013-08-18 ENCOUNTER — Encounter (HOSPITAL_COMMUNITY): Payer: 59 | Admitting: Anesthesiology

## 2013-08-18 ENCOUNTER — Inpatient Hospital Stay (HOSPITAL_COMMUNITY)
Admission: RE | Admit: 2013-08-18 | Discharge: 2013-08-19 | DRG: 473 | Disposition: A | Discharge status: Home / Self Care | Payer: 59 | Source: Ambulatory Visit | Attending: Neurosurgery | Admitting: Neurosurgery

## 2013-08-18 DIAGNOSIS — Z807 Family history of other malignant neoplasms of lymphoid, hematopoietic and related tissues: Secondary | ICD-10-CM

## 2013-08-18 DIAGNOSIS — S129XXA Fracture of neck, unspecified, initial encounter: Secondary | ICD-10-CM

## 2013-08-18 DIAGNOSIS — Z79899 Other long term (current) drug therapy: Secondary | ICD-10-CM

## 2013-08-18 DIAGNOSIS — F319 Bipolar disorder, unspecified: Secondary | ICD-10-CM | POA: Diagnosis present

## 2013-08-18 DIAGNOSIS — T84498A Other mechanical complication of other internal orthopedic devices, implants and grafts, initial encounter: Principal | ICD-10-CM | POA: Diagnosis present

## 2013-08-18 DIAGNOSIS — K219 Gastro-esophageal reflux disease without esophagitis: Secondary | ICD-10-CM | POA: Diagnosis present

## 2013-08-18 DIAGNOSIS — Z8261 Family history of arthritis: Secondary | ICD-10-CM

## 2013-08-18 DIAGNOSIS — G479 Sleep disorder, unspecified: Secondary | ICD-10-CM | POA: Diagnosis present

## 2013-08-18 DIAGNOSIS — F411 Generalized anxiety disorder: Secondary | ICD-10-CM | POA: Diagnosis present

## 2013-08-18 DIAGNOSIS — Z87891 Personal history of nicotine dependence: Secondary | ICD-10-CM

## 2013-08-18 DIAGNOSIS — E785 Hyperlipidemia, unspecified: Secondary | ICD-10-CM | POA: Diagnosis present

## 2013-08-18 DIAGNOSIS — Z9851 Tubal ligation status: Secondary | ICD-10-CM

## 2013-08-18 DIAGNOSIS — Z981 Arthrodesis status: Secondary | ICD-10-CM

## 2013-08-18 DIAGNOSIS — Z791 Long term (current) use of non-steroidal anti-inflammatories (NSAID): Secondary | ICD-10-CM

## 2013-08-18 DIAGNOSIS — Y831 Surgical operation with implant of artificial internal device as the cause of abnormal reaction of the patient, or of later complication, without mention of misadventure at the time of the procedure: Secondary | ICD-10-CM | POA: Diagnosis present

## 2013-08-18 DIAGNOSIS — J310 Chronic rhinitis: Secondary | ICD-10-CM | POA: Diagnosis present

## 2013-08-18 DIAGNOSIS — Z825 Family history of asthma and other chronic lower respiratory diseases: Secondary | ICD-10-CM

## 2013-08-18 HISTORY — PX: POSTERIOR CERVICAL FUSION/FORAMINOTOMY: SHX5038

## 2013-08-18 SURGERY — POSTERIOR CERVICAL FUSION/FORAMINOTOMY LEVEL 3
Anesthesia: General

## 2013-08-18 MED ORDER — SCOPOLAMINE 1 MG/3DAYS TD PT72: 1.0000 | MEDICATED_PATCH | TRANSDERMAL | Status: DC

## 2013-08-18 MED ORDER — SODIUM CHLORIDE 0.9 % IJ SOLN: 3.0000 mL | INTRAMUSCULAR | Status: DC | PRN

## 2013-08-18 MED ORDER — ROCURONIUM BROMIDE 100 MG/10ML IV SOLN
INTRAVENOUS | Status: DC | PRN
  Administered 2013-08-18: 30 mg via INTRAVENOUS
  Administered 2013-08-18: 20 mg via INTRAVENOUS

## 2013-08-18 MED ORDER — FENTANYL CITRATE 0.05 MG/ML IJ SOLN
INTRAMUSCULAR | Status: DC | PRN
  Administered 2013-08-18 (×2): 50 ug via INTRAVENOUS
  Administered 2013-08-18: 100 ug via INTRAVENOUS
  Administered 2013-08-18 (×2): 50 ug via INTRAVENOUS
  Administered 2013-08-18: 100 ug via INTRAVENOUS
  Administered 2013-08-18: 50 ug via INTRAVENOUS

## 2013-08-18 MED ORDER — SCOPOLAMINE 1 MG/3DAYS TD PT72
MEDICATED_PATCH | TRANSDERMAL | Status: DC | PRN
  Administered 2013-08-18: 1 via TRANSDERMAL

## 2013-08-18 MED ORDER — LORATADINE 10 MG PO TABS
10.0000 mg | ORAL_TABLET | Freq: Every day | ORAL | Status: DC
Start: 2013-08-18 — End: 2013-08-19
  Administered 2013-08-18: 10 mg via ORAL
  Filled 2013-08-18 (×2): qty 1

## 2013-08-18 MED ORDER — ALUM & MAG HYDROXIDE-SIMETH 200-200-20 MG/5ML PO SUSP: 30.0000 mL | Freq: Four times a day (QID) | ORAL | Status: DC | PRN

## 2013-08-18 MED ORDER — HYDROXYZINE HCL 25 MG PO TABS: 50.0000 mg | ORAL_TABLET | ORAL | Status: DC | PRN

## 2013-08-18 MED ORDER — ALPRAZOLAM 0.25 MG PO TABS: 0.2500 mg | ORAL_TABLET | Freq: Two times a day (BID) | ORAL | Status: DC | PRN

## 2013-08-18 MED ORDER — LIDOCAINE-EPINEPHRINE 1 %-1:100000 IJ SOLN
INTRAMUSCULAR | Status: DC | PRN
  Administered 2013-08-18: 5 mL

## 2013-08-18 MED ORDER — ROCURONIUM BROMIDE 50 MG/5ML IV SOLN
INTRAVENOUS | Status: AC
  Filled 2013-08-18: qty 1

## 2013-08-18 MED ORDER — MIDAZOLAM HCL 2 MG/2ML IJ SOLN
INTRAMUSCULAR | Status: AC
  Filled 2013-08-18: qty 2

## 2013-08-18 MED ORDER — GLYCOPYRROLATE 0.2 MG/ML IJ SOLN
INTRAMUSCULAR | Status: DC | PRN
  Administered 2013-08-18: .6 mg via INTRAVENOUS

## 2013-08-18 MED ORDER — KETOROLAC TROMETHAMINE 30 MG/ML IJ SOLN: 30.0000 mg | Freq: Once | INTRAMUSCULAR | Status: AC

## 2013-08-18 MED ORDER — ARTIFICIAL TEARS OP OINT
TOPICAL_OINTMENT | OPHTHALMIC | Status: DC | PRN
  Administered 2013-08-18: 1 via OPHTHALMIC

## 2013-08-18 MED ORDER — AMPHETAMINE-DEXTROAMPHET ER 10 MG PO CP24
30.0000 mg | ORAL_CAPSULE | Freq: Two times a day (BID) | ORAL | Status: DC
  Administered 2013-08-18: 30 mg via ORAL
  Filled 2013-08-18: qty 3

## 2013-08-18 MED ORDER — LIDOCAINE HCL (CARDIAC) 20 MG/ML IV SOLN
INTRAVENOUS | Status: AC
  Filled 2013-08-18: qty 5

## 2013-08-18 MED ORDER — ONDANSETRON HCL 4 MG/2ML IJ SOLN
INTRAMUSCULAR | Status: DC | PRN
Start: 2013-08-18 — End: 2013-08-18
  Administered 2013-08-18 (×2): 4 mg via INTRAVENOUS

## 2013-08-18 MED ORDER — PROPOFOL 10 MG/ML IV BOLUS
INTRAVENOUS | Status: DC | PRN
  Administered 2013-08-18: 200 mg via INTRAVENOUS
  Administered 2013-08-18: 40 mg via INTRAVENOUS

## 2013-08-18 MED ORDER — ONDANSETRON HCL 4 MG/2ML IJ SOLN: 4.0000 mg | Freq: Four times a day (QID) | INTRAMUSCULAR | Status: DC | PRN

## 2013-08-18 MED ORDER — FENTANYL CITRATE 0.05 MG/ML IJ SOLN
INTRAMUSCULAR | Status: AC
  Filled 2013-08-18: qty 5

## 2013-08-18 MED ORDER — DOCUSATE SODIUM 100 MG PO CAPS
300.0000 mg | ORAL_CAPSULE | Freq: Every morning | ORAL | Status: DC
  Administered 2013-08-18: 300 mg via ORAL
  Filled 2013-08-18 (×2): qty 3

## 2013-08-18 MED ORDER — NEOSTIGMINE METHYLSULFATE 10 MG/10ML IV SOLN
INTRAVENOUS | Status: AC
  Filled 2013-08-18: qty 1

## 2013-08-18 MED ORDER — KETOROLAC TROMETHAMINE 30 MG/ML IJ SOLN
INTRAMUSCULAR | Status: AC
  Filled 2013-08-18: qty 1

## 2013-08-18 MED ORDER — ONDANSETRON HCL 4 MG/2ML IJ SOLN
INTRAMUSCULAR | Status: AC
  Filled 2013-08-18: qty 2

## 2013-08-18 MED ORDER — LACTATED RINGERS IV SOLN
INTRAVENOUS | Status: DC | PRN
  Administered 2013-08-18: 08:00:00 via INTRAVENOUS

## 2013-08-18 MED ORDER — LIDOCAINE HCL (CARDIAC) 20 MG/ML IV SOLN
INTRAVENOUS | Status: DC | PRN
  Administered 2013-08-18: 100 mg via INTRAVENOUS

## 2013-08-18 MED ORDER — ACETAMINOPHEN 10 MG/ML IV SOLN
INTRAVENOUS | Status: AC
  Filled 2013-08-18: qty 100

## 2013-08-18 MED ORDER — HYDROMORPHONE HCL PF 1 MG/ML IJ SOLN
INTRAMUSCULAR | Status: AC
  Filled 2013-08-18: qty 1

## 2013-08-18 MED ORDER — BISACODYL 10 MG RE SUPP: 10.0000 mg | Freq: Every day | RECTAL | Status: DC | PRN

## 2013-08-18 MED ORDER — ONDANSETRON HCL 4 MG/2ML IJ SOLN: 4.0000 mg | Freq: Once | INTRAMUSCULAR | Status: DC | PRN

## 2013-08-18 MED ORDER — VECURONIUM BROMIDE 10 MG IV SOLR
INTRAVENOUS | Status: DC | PRN
  Administered 2013-08-18: 2 mg via INTRAVENOUS
  Administered 2013-08-18 (×3): 1 mg via INTRAVENOUS
  Administered 2013-08-18: 2 mg via INTRAVENOUS
  Administered 2013-08-18: 1 mg via INTRAVENOUS

## 2013-08-18 MED ORDER — LAMOTRIGINE 200 MG PO TABS
400.0000 mg | ORAL_TABLET | Freq: Every day | ORAL | Status: DC
  Administered 2013-08-18: 400 mg via ORAL
  Filled 2013-08-18 (×2): qty 2

## 2013-08-18 MED ORDER — GEMFIBROZIL 600 MG PO TABS
600.0000 mg | ORAL_TABLET | Freq: Two times a day (BID) | ORAL | Status: DC
  Administered 2013-08-18: 600 mg via ORAL
  Filled 2013-08-18 (×4): qty 1

## 2013-08-18 MED ORDER — KCL IN DEXTROSE-NACL 20-5-0.45 MEQ/L-%-% IV SOLN
INTRAVENOUS | Status: DC
  Filled 2013-08-18 (×4): qty 1000

## 2013-08-18 MED ORDER — MENTHOL 3 MG MT LOZG: 1.0000 | LOZENGE | OROMUCOSAL | Status: DC | PRN

## 2013-08-18 MED ORDER — HYDROMORPHONE HCL PF 1 MG/ML IJ SOLN
0.2500 mg | INTRAMUSCULAR | Status: DC | PRN
  Administered 2013-08-18 (×3): 0.5 mg via INTRAVENOUS

## 2013-08-18 MED ORDER — NEOSTIGMINE METHYLSULFATE 10 MG/10ML IV SOLN
INTRAVENOUS | Status: DC | PRN
  Administered 2013-08-18: 4 mg via INTRAVENOUS

## 2013-08-18 MED ORDER — ACETAMINOPHEN 325 MG PO TABS: 650.0000 mg | ORAL_TABLET | ORAL | Status: DC | PRN

## 2013-08-18 MED ORDER — MORPHINE SULFATE 4 MG/ML IJ SOLN
4.0000 mg | INTRAMUSCULAR | Status: DC | PRN
  Administered 2013-08-18: 4 mg via INTRAMUSCULAR
  Filled 2013-08-18: qty 1

## 2013-08-18 MED ORDER — SUCCINYLCHOLINE CHLORIDE 20 MG/ML IJ SOLN
INTRAMUSCULAR | Status: AC
  Filled 2013-08-18: qty 1

## 2013-08-18 MED ORDER — ACETAMINOPHEN 10 MG/ML IV SOLN
INTRAVENOUS | Status: DC | PRN
  Administered 2013-08-18: 1000 mg via INTRAVENOUS

## 2013-08-18 MED ORDER — MIDAZOLAM HCL 5 MG/5ML IJ SOLN
INTRAMUSCULAR | Status: DC | PRN
  Administered 2013-08-18: 1 mg via INTRAVENOUS

## 2013-08-18 MED ORDER — 0.9 % SODIUM CHLORIDE (POUR BTL) OPTIME
TOPICAL | Status: DC | PRN
Start: 2013-08-18 — End: 2013-08-18
  Administered 2013-08-18: 1000 mL

## 2013-08-18 MED ORDER — PHENOL 1.4 % MT LIQD: 1.0000 | OROMUCOSAL | Status: DC | PRN

## 2013-08-18 MED ORDER — ACETAMINOPHEN 10 MG/ML IV SOLN: 1000.0000 mg | Freq: Once | INTRAVENOUS | Status: AC

## 2013-08-18 MED ORDER — GLYCOPYRROLATE 0.2 MG/ML IJ SOLN
INTRAMUSCULAR | Status: AC
  Filled 2013-08-18: qty 3

## 2013-08-18 MED ORDER — BACITRACIN 50000 UNITS IM SOLR
INTRAMUSCULAR | Status: DC | PRN
  Administered 2013-08-18: 10:00:00

## 2013-08-18 MED ORDER — AMPHETAMINE-DEXTROAMPHET ER 10 MG PO CP24: 30.0000 mg | ORAL_CAPSULE | Freq: Two times a day (BID) | ORAL | Status: DC

## 2013-08-18 MED ORDER — BUPROPION HCL ER (XL) 300 MG PO TB24
450.0000 mg | ORAL_TABLET | Freq: Every morning | ORAL | Status: DC
  Administered 2013-08-18: 450 mg via ORAL
  Filled 2013-08-18 (×2): qty 1

## 2013-08-18 MED ORDER — THROMBIN 20000 UNITS EX SOLR
CUTANEOUS | Status: DC | PRN
  Administered 2013-08-18: 10:00:00 via TOPICAL

## 2013-08-18 MED ORDER — BUPIVACAINE HCL (PF) 0.5 % IJ SOLN
INTRAMUSCULAR | Status: DC | PRN
  Administered 2013-08-18: 5 mL

## 2013-08-18 MED ORDER — CYCLOBENZAPRINE HCL 10 MG PO TABS
ORAL_TABLET | ORAL | Status: AC
  Filled 2013-08-18: qty 1

## 2013-08-18 MED ORDER — CALCIUM CARBONATE ANTACID 500 MG PO CHEW
2.0000 | CHEWABLE_TABLET | Freq: Two times a day (BID) | ORAL | Status: DC
  Administered 2013-08-18: 400 mg via ORAL
  Filled 2013-08-18 (×4): qty 2

## 2013-08-18 MED ORDER — CYCLOBENZAPRINE HCL 10 MG PO TABS
10.0000 mg | ORAL_TABLET | Freq: Three times a day (TID) | ORAL | Status: DC | PRN
  Administered 2013-08-18 – 2013-08-19 (×3): 10 mg via ORAL
  Filled 2013-08-18 (×2): qty 1

## 2013-08-18 MED ORDER — ACETAMINOPHEN 650 MG RE SUPP: 650.0000 mg | RECTAL | Status: DC | PRN

## 2013-08-18 MED ORDER — KETOROLAC TROMETHAMINE 30 MG/ML IJ SOLN
30.0000 mg | Freq: Four times a day (QID) | INTRAMUSCULAR | Status: DC
  Administered 2013-08-18 – 2013-08-19 (×4): 30 mg via INTRAVENOUS
  Filled 2013-08-18 (×7): qty 1

## 2013-08-18 MED ORDER — HYDROCODONE-ACETAMINOPHEN 5-325 MG PO TABS: 1.0000 | ORAL_TABLET | ORAL | Status: DC | PRN

## 2013-08-18 MED ORDER — DULOXETINE HCL 60 MG PO CPEP
60.0000 mg | ORAL_CAPSULE | Freq: Every morning | ORAL | Status: DC
  Administered 2013-08-18: 60 mg via ORAL
  Filled 2013-08-18 (×2): qty 1

## 2013-08-18 MED ORDER — SODIUM CHLORIDE 0.9 % IJ SOLN
3.0000 mL | Freq: Two times a day (BID) | INTRAMUSCULAR | Status: DC
  Administered 2013-08-18 (×2): 3 mL via INTRAVENOUS

## 2013-08-18 MED ORDER — MAGNESIUM HYDROXIDE 400 MG/5ML PO SUSP: 30.0000 mL | Freq: Every day | ORAL | Status: DC | PRN

## 2013-08-18 MED ORDER — BACITRACIN ZINC 500 UNIT/GM EX OINT
TOPICAL_OINTMENT | CUTANEOUS | Status: DC | PRN
  Administered 2013-08-18: 1 via TOPICAL

## 2013-08-18 MED ORDER — OXYCODONE-ACETAMINOPHEN 5-325 MG PO TABS
1.0000 | ORAL_TABLET | ORAL | Status: DC | PRN
  Administered 2013-08-18 – 2013-08-19 (×4): 2 via ORAL
  Filled 2013-08-18 (×4): qty 2

## 2013-08-18 MED ORDER — SUCCINYLCHOLINE CHLORIDE 20 MG/ML IJ SOLN
INTRAMUSCULAR | Status: DC | PRN
  Administered 2013-08-18: 100 mg via INTRAVENOUS

## 2013-08-18 MED ORDER — PROPOFOL 10 MG/ML IV BOLUS
INTRAVENOUS | Status: AC
  Filled 2013-08-18: qty 20

## 2013-08-18 MED ORDER — RISPERIDONE 2 MG PO TBDP
4.0000 mg | ORAL_TABLET | Freq: Every day | ORAL | Status: DC
  Administered 2013-08-18: 4 mg via ORAL
  Filled 2013-08-18 (×2): qty 2

## 2013-08-18 MED ORDER — LIDOCAINE HCL 4 % MT SOLN
OROMUCOSAL | Status: DC | PRN
  Administered 2013-08-18: 4 mL via TOPICAL

## 2013-08-18 MED ORDER — SCOPOLAMINE 1 MG/3DAYS TD PT72
MEDICATED_PATCH | TRANSDERMAL | Status: AC
  Filled 2013-08-18: qty 1

## 2013-08-18 MED ORDER — LURASIDONE HCL 40 MG PO TABS
120.0000 mg | ORAL_TABLET | Freq: Every day | ORAL | Status: DC
  Administered 2013-08-18: 120 mg via ORAL
  Filled 2013-08-18 (×2): qty 3

## 2013-08-18 SURGICAL SUPPLY — 77 items
BAG DECANTER FOR FLEXI CONT (MISCELLANEOUS) ×2 IMPLANT
BIT DRILL NEURO 2X3.1 SFT TUCH (MISCELLANEOUS) ×1 IMPLANT
BIT DRILL WIRE PASS 1.3MM (BIT) ×1 IMPLANT
BLADE 10 SAFETY STRL DISP (BLADE) ×2 IMPLANT
BLADE SURG 11 STRL SS (BLADE) ×2 IMPLANT
BLADE SURG ROTATE 9660 (MISCELLANEOUS) ×2 IMPLANT
BLOCKER OASYS (Neuro Prosthesis/Implant) ×16 IMPLANT
BRUSH SCRUB EZ PLAIN DRY (MISCELLANEOUS) ×2 IMPLANT
CANISTER SUCT 3000ML (MISCELLANEOUS) ×2 IMPLANT
CONT SPEC 4OZ CLIKSEAL STRL BL (MISCELLANEOUS) ×2 IMPLANT
COVER TABLE BACK 60X90 (DRAPES) ×2 IMPLANT
DECANTER SPIKE VIAL GLASS SM (MISCELLANEOUS) ×2 IMPLANT
DERMABOND ADHESIVE PROPEN (GAUZE/BANDAGES/DRESSINGS) ×3
DERMABOND ADVANCED (GAUZE/BANDAGES/DRESSINGS) ×1
DERMABOND ADVANCED .7 DNX12 (GAUZE/BANDAGES/DRESSINGS) ×1 IMPLANT
DERMABOND ADVANCED .7 DNX6 (GAUZE/BANDAGES/DRESSINGS) ×3 IMPLANT
DRAPE C-ARM 42X72 X-RAY (DRAPES) ×4 IMPLANT
DRAPE LAPAROTOMY 100X72 PEDS (DRAPES) ×2 IMPLANT
DRAPE POUCH INSTRU U-SHP 10X18 (DRAPES) ×2 IMPLANT
DRAPE PROXIMA HALF (DRAPES) IMPLANT
DRILL NEURO 2X3.1 SOFT TOUCH (MISCELLANEOUS) ×2
DRILL OASYS 2.5MM (BIT) ×1 IMPLANT
DRILL WIRE PASS 1.3MM (BIT) ×2
DRIUS OASYS 2.5MM (BIT) ×2
DRSG EMULSION OIL 3X3 NADH (GAUZE/BANDAGES/DRESSINGS) IMPLANT
ELECT REM PT RETURN 9FT ADLT (ELECTROSURGICAL) ×2
ELECTRODE REM PT RTRN 9FT ADLT (ELECTROSURGICAL) ×1 IMPLANT
EVACUATOR 1/8 PVC DRAIN (DRAIN) IMPLANT
GAUZE SPONGE 4X4 16PLY XRAY LF (GAUZE/BANDAGES/DRESSINGS) IMPLANT
GLOVE BIOGEL PI IND STRL 7.5 (GLOVE) ×1 IMPLANT
GLOVE BIOGEL PI IND STRL 8 (GLOVE) ×1 IMPLANT
GLOVE BIOGEL PI IND STRL 8.5 (GLOVE) ×1 IMPLANT
GLOVE BIOGEL PI INDICATOR 7.5 (GLOVE) ×1
GLOVE BIOGEL PI INDICATOR 8 (GLOVE) ×1
GLOVE BIOGEL PI INDICATOR 8.5 (GLOVE) ×1
GLOVE ECLIPSE 6.5 STRL STRAW (GLOVE) ×2 IMPLANT
GLOVE ECLIPSE 7.5 STRL STRAW (GLOVE) ×2 IMPLANT
GLOVE EXAM NITRILE LRG STRL (GLOVE) IMPLANT
GLOVE EXAM NITRILE MD LF STRL (GLOVE) IMPLANT
GLOVE EXAM NITRILE XL STR (GLOVE) IMPLANT
GLOVE EXAM NITRILE XS STR PU (GLOVE) IMPLANT
GLOVE SURG SS PI 7.0 STRL IVOR (GLOVE) ×4 IMPLANT
GOWN STRL REUS W/ TWL LRG LVL3 (GOWN DISPOSABLE) ×2 IMPLANT
GOWN STRL REUS W/ TWL XL LVL3 (GOWN DISPOSABLE) ×1 IMPLANT
GOWN STRL REUS W/TWL 2XL LVL3 (GOWN DISPOSABLE) IMPLANT
GOWN STRL REUS W/TWL LRG LVL3 (GOWN DISPOSABLE) ×2
GOWN STRL REUS W/TWL XL LVL3 (GOWN DISPOSABLE) ×1
HEMOSTAT SURGICEL 2X14 (HEMOSTASIS) IMPLANT
KIT BASIN OR (CUSTOM PROCEDURE TRAY) ×2 IMPLANT
KIT INFUSE X SMALL 1.4CC (Orthopedic Implant) ×2 IMPLANT
KIT ROOM TURNOVER OR (KITS) ×2 IMPLANT
NEEDLE SPNL 18GX3.5 QUINCKE PK (NEEDLE) ×2 IMPLANT
NEEDLE SPNL 22GX3.5 QUINCKE BK (NEEDLE) ×4 IMPLANT
NS IRRIG 1000ML POUR BTL (IV SOLUTION) ×2 IMPLANT
PACK LAMINECTOMY NEURO (CUSTOM PROCEDURE TRAY) ×2 IMPLANT
PAD ARMBOARD 7.5X6 YLW CONV (MISCELLANEOUS) ×6 IMPLANT
PATTIES SURGICAL 1X1 (DISPOSABLE) ×2 IMPLANT
PIN MAYFIELD SKULL DISP (PIN) ×2 IMPLANT
ROD OASYS 3.5X60MM (Rod) ×4 IMPLANT
SCREW BIASED ANGLE 3.5X14 (Screw) ×16 IMPLANT
SPONGE GAUZE 4X4 12PLY (GAUZE/BANDAGES/DRESSINGS) ×2 IMPLANT
SPONGE LAP 4X18 X RAY DECT (DISPOSABLE) IMPLANT
SPONGE SURGIFOAM ABS GEL 100 (HEMOSTASIS) ×2 IMPLANT
STAPLER SKIN PROX WIDE 3.9 (STAPLE) ×2 IMPLANT
STRIP BIOACTIVE VITOSS 25X100X (Neuro Prosthesis/Implant) ×2 IMPLANT
SUT ETHILON 3 0 FSL (SUTURE) IMPLANT
SUT VIC AB 0 CT1 18XCR BRD8 (SUTURE) ×2 IMPLANT
SUT VIC AB 0 CT1 8-18 (SUTURE) ×2
SUT VIC AB 2-0 CP2 18 (SUTURE) ×6 IMPLANT
SYR 20ML ECCENTRIC (SYRINGE) ×2 IMPLANT
TAP 3.5MM (TAP) ×2 IMPLANT
TAPE CLOTH SURG 4X10 WHT LF (GAUZE/BANDAGES/DRESSINGS) ×2 IMPLANT
TOWEL OR 17X24 6PK STRL BLUE (TOWEL DISPOSABLE) ×2 IMPLANT
TOWEL OR 17X26 10 PK STRL BLUE (TOWEL DISPOSABLE) ×2 IMPLANT
TRAY FOLEY CATH 14FRSI W/METER (CATHETERS) IMPLANT
UNDERPAD 30X30 INCONTINENT (UNDERPADS AND DIAPERS) ×2 IMPLANT
WATER STERILE IRR 1000ML POUR (IV SOLUTION) ×2 IMPLANT

## 2013-08-18 NOTE — Transfer of Care (Signed)
Immediate Anesthesia Transfer of Care Note  Patient: Kristen [name]  Procedure(s) Performed: Procedure(s) with comments: CERVICAL FOUR TO CERVICAL SEVEN POSTERIOR CERVICAL FUSION/FORAMINOTOMY LEVEL 3 (N/A) - C4-7 posterior cervical fusion with lateral mass fixation  Patient Location: PACU  Anesthesia Type:General  Level of Consciousness: awake and alert   Airway & Oxygen Therapy: Patient Spontanous Breathing and Patient connected to nasal cannula oxygen  Post-op Assessment: Report given to PACU RN, Post -op Vital signs reviewed and stable and Patient moving all extremities  Post vital signs: Reviewed and stable  Complications: No apparent anesthesia complications

## 2013-08-18 NOTE — Anesthesia Postprocedure Evaluation (Signed)
  Anesthesia Post-op Note  Patient: Kristen [name]  Procedure(s) Performed: Procedure(s) with comments: CERVICAL FOUR TO CERVICAL SEVEN POSTERIOR CERVICAL FUSION/FORAMINOTOMY LEVEL 3 (N/A) - C4-7 posterior cervical fusion with lateral mass fixation  Patient Location: PACU  Anesthesia Type:General  Level of Consciousness: awake, oriented, sedated and patient cooperative  Airway and Oxygen Therapy: Patient Spontanous Breathing  Post-op Pain: mild  Post-op Assessment: Post-op Vital signs reviewed, Patient's Cardiovascular Status Stable, Respiratory Function Stable, Patent Airway and No signs of Nausea or vomiting  Post-op Vital Signs: stable  Last Vitals:  Filed Vitals:   08/18/13 1200  BP: 127/67  Pulse: 103  Temp:   Resp: 15    Complications: No apparent anesthesia complications

## 2013-08-18 NOTE — Op Note (Signed)
08/18/2013  11:26 AM  PATIENT:  Kristen [name]  43 y.o. 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 A 3 pin Mayfield head holder (radiolucent) was applied, the patient was turned to a prone position. The posterior neck and upper back were prepped with Betadine soap and solution and draped in a sterile fashion. The midline was infiltrated with local site with epinephrine, and a midline incision made, carried down to subcutaneous tissue. Bipolar cautery and electrocautery used to maintain hemostasis. Dissection was carried down to the posterior cervical fascia that was incised bilaterally and the paracervical musculature was dissected from the spinous processes and lamina in a subperiosteal fashion. Self-retaining retractors were placed, and the C-arm fluoroscope was used to identify the C4, C5, C6, and C7 spinous processes, lamina, and lateral masses. A wire passing drill was used to make pilot holes in the lateral masses of C4, C5, C6, and C7, and then each of the screw holes was made with a hand drill into the superior, lateral, rostral trajectory. A ball probe was was used to examine each screw hole, good bony surfaces were found, and then the posterior cortex was tapped, and we placed 3.5 x 14 mm Oasys lateral mass screws bilaterally  at each level. We then contoured a 60 mm rod to a gentle lordosis. Each rod was placed within the screw heads, and secured with locking caps. Once all 8 locking caps were placed final tightening was performed against a counter torque. Using the high-speed drill and a small burr the laminar and facet surfaces were decorticated. We then packed infuse and Vitoss BA over the lamina and into the C4-5 and C6-7 facet joints bilaterally. We then proceeded with closure. Deep fascia was closed with interrupted undyed 0 Vicryl sutures. Scarpa's fascia was closed with interrupted undyed 0 Vicryl sutures. The subcutaneous and subcuticular layer were closed with interrupted inverted 2-0 Vicryl sutures. Skin is approximate Dermabond. A dressing of sterile gauze and Hypafix was applied. Final AP and lateral cervical spine x-rays showed good placement of the screws, and the overall alignment was good. Following surgery the patient was turned back to a supine position, the 3 pin Mayfield head holder was removed, and the patient is to be reversed and the anesthetic, extubated, and transferred to the recovery room for further care.  PLAN OF CARE: Admit for overnight observation  PATIENT DISPOSITION:  PACU - hemodynamically stable.   Delay start of Pharmacological VTE agent (>24hrs) due to surgical blood loss or risk of bleeding:  yes

## 2013-08-18 NOTE — Anesthesia Preprocedure Evaluation (Addendum)
Anesthesia Evaluation  Patient identified by MRN, date of birth, ID band Patient awake    Reviewed: Allergy & Precautions, H&P , NPO status , Patient's Chart, lab work & pertinent test results  History of Anesthesia Complications (+) PONV  Airway Mallampati: II TM Distance: >3 FB Neck ROM: Limited    Dental  (+) Teeth Intact, Dental Advisory Given   Pulmonary former smoker,          Cardiovascular     Neuro/Psych PSYCHIATRIC DISORDERS    GI/Hepatic GERD-  ,  Endo/Other    Renal/GU Renal disease     Musculoskeletal   Abdominal   Peds  Hematology   Anesthesia Other Findings   Reproductive/Obstetrics                          Anesthesia Physical Anesthesia Plan  ASA: I  Anesthesia Plan: General   Post-op Pain Management:    Induction: Intravenous  Airway Management Planned: Oral ETT  Additional Equipment:   Intra-op Plan:   Post-operative Plan: Extubation in OR  Informed Consent: I have reviewed the patients History and Physical, chart, labs and discussed the procedure including the risks, benefits and alternatives for the proposed anesthesia with the patient or authorized representative who has indicated his/her understanding and acceptance.     Plan Discussed with:   Anesthesia Plan Comments:         Anesthesia Quick Evaluation

## 2013-08-18 NOTE — Progress Notes (Signed)
Filed Vitals:   08/18/13 1215 08/18/13 1230 08/18/13 1300 08/18/13 1637  BP: 124/67 107/72 127/86 124/73  Pulse: 107 104 99 93  Temp:  98.8 F (37.1 C) 98.5 F (36.9 C) 98.9 F (37.2 C)  TempSrc:      Resp: 13 9 18 18   SpO2: 95% 96% 94% 94%    Patient resting comfortably in bed. Has been up and ambulating in the hall. Has voided. Moving all 4 extremities well. Dressing changed by nursing staff, now clean and dry.  Plan: Encouraged to increase ambulation in the halls. Continue to progress through postoperative recovery.  Hosie Spangle, MD 08/18/2013, 5:15 PM

## 2013-08-18 NOTE — Plan of Care (Signed)
Problem: Consults Goal: Diagnosis - Spinal Surgery Outcome: Completed/Met Date Met:  08/18/13 Cervical Spine Fusion

## 2013-08-18 NOTE — H&P (Signed)
Subjective: Patient is a 43 y.o. female who is admitted for posterior cervical arthrodesis for treatment of a nonunion and pseudoarthrosis at the C4-5 and C6-7 levels. Patient underwent a C5-6 ACDF in February 2003, more recently she underwent a C4-5 and C6-7 ACDF in January of 2014. She did well following both surgeries, but about 4 1/2 months ago she developed pain in the left antecubital area with numbness and tingling in the second third and fourth digits the left hand, this was associated with left-sided posterior neck pain and limitation in mobility of her neck. She was evaluated with CT and MRI. The CT showed a nonunion and pseudoarthrosis at both the C4-5 and C6-7 levels, with good fusion at the C5-6 level. MRI showed minimal degenerative changes in the cervical spine, but no evidence of disc herniation, sac, spinal cord, or nerve root compression or impingement.  Patient admitted now for a C4-C7 posterior arthrodesis with lateral mass screws, rods, and bone graft.   Patient Active Problem List   Diagnosis Date Noted  . Sleep disorder 07/20/2012  . Chronic rhinitis 07/20/2012   Past Medical History  Diagnosis Date  . PONV (postoperative nausea and vomiting)   . Anxiety   . Mental disorder     bipolar  . Depression   . 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 GERD (gastroesophageal reflux disease)     takes tums daily  . Arthritis   . Bipolar disorder   . Hyperlipemia     Past Surgical History  Procedure Laterality Date  . Cervical spine surgery  04  . Breast surgery  95    breast reduction   . Nasal sinus surgery    . Endometrial ablation    . Cesarean section      x2  . Tubal ligation    . Diagnostic laparoscopy      staph infection   . Anterior cervical decomp/discectomy fusion  02/17/2012    Procedure: ANTERIOR CERVICAL DECOMPRESSION/DISCECTOMY FUSION 2 LEVELS;  Surgeon: Hosie Spangle, MD;  Location: [facility];  Service: Neurosurgery;  Laterality: N/A;  Cervical four-five  and Cervical six-seven anterior cervial decompression with fusion plating and bonegraft    Prescriptions prior to admission  Medication Sig Dispense Refill  . ALPRAZolam (XANAX) 0.5 MG tablet Take 0.25-0.5 mg by mouth 2 (two) times daily as needed for anxiety.       Marland Kitchen amphetamine-dextroamphetamine (ADDERALL XR) 30 MG 24 hr capsule Take 30 mg by mouth 2 (two) times daily.      Marland Kitchen buPROPion (WELLBUTRIN XL) 150 MG 24 hr tablet Take 450 mg by mouth every morning.       . calcium carbonate (TUMS - DOSED IN MG ELEMENTAL CALCIUM) 500 MG chewable tablet Chew 2 tablets by mouth 2 (two) times daily with a meal.      . cetirizine (ZYRTEC) 10 MG tablet Take 10 mg by mouth daily.      Marland Kitchen docusate sodium (COLACE) 100 MG capsule Take 300 mg by mouth every morning.      . DULoxetine (CYMBALTA) 60 MG capsule Take 60 mg by mouth every morning.      Marland Kitchen gemfibrozil (LOPID) 600 MG tablet Take 600 mg by mouth 2 (two) times daily before a meal.      . ibuprofen (ADVIL,MOTRIN) 200 MG tablet Take 800 mg by mouth every 6 (six) hours as needed for mild pain or moderate pain.      Marland Kitchen lamoTRIgine (LAMICTAL) 200 MG tablet Take  400 mg by mouth at bedtime.      . Lurasidone HCl (LATUDA) 120 MG TABS Take 120 mg by mouth daily with supper.      . risperiDONE (RISPERDAL M-TABS) 4 MG disintegrating tablet Take 4 mg by mouth at bedtime.        Allergies  Allergen Reactions  . Iohexol Hives     Desc: IVP DYE     History  Substance Use Topics  . Smoking status: Former Smoker -- 1.00 packs/day for 5 years    Quit date: 02/09/2002  . Smokeless tobacco: Never Used  . Alcohol Use: No    Family History  Problem Relation Age of Onset  . Allergies      entire family(mom and dad)  . Asthma Mother   . Rheum arthritis Mother   . Non-Hodgkin's lymphoma Mother      Review of Systems A comprehensive review of systems was negative.  Objective: Vital signs in last 24 hours: Temp:  [98.4 F (36.9 C)] 98.4 F (36.9 C) (07/08  0723) Pulse Rate:  [81] 81 (07/08 0723) Resp:  [20] 20 (07/08 0723) BP: (112)/(76) 112/76 mmHg (07/08 0723) SpO2:  [96 %] 96 % (07/08 0723)  EXAM: Patient is a well-developed well-nourished white female in no acute distress. Lungs are clear to auscultation , the patient has symmetrical respiratory excursion. Heart has a regular rate and rhythm normal S1 and S2 no murmur.   Abdomen is soft nontender nondistended bowel sounds are present. Extremity examination shows no clubbing cyanosis or edema. Musculoskeletal examination shows tenderness to palpation in the posterior neck, more prominent on the left than the right.  She has decreased range of motion neck with flexion, extension, and lateral flexion to either side, and range of motion neck is uncomfortable. Motor examination shows 5 over 5 strength in the upper extremities including the deltoid biceps triceps and intrinsics and grip. Sensation is intact to pinprick throughout the digits of the upper extremities. Reflexes are symmetrical and without evidence of pathologic reflexes. Patient has a normal gait and stance.   Data Review:CBC    Component Value Date/Time   WBC 9.9 08/09/2013 1305   RBC 4.42 08/09/2013 1305   HGB 12.6 08/09/2013 1305   HCT 38.5 08/09/2013 1305   PLT 407* 08/09/2013 1305   MCV 87.1 08/09/2013 1305   MCH 28.5 08/09/2013 1305   MCHC 32.7 08/09/2013 1305   RDW 13.8 08/09/2013 1305   LYMPHSABS 2.2 10/31/2008 0510   MONOABS 0.9 10/31/2008 0510   EOSABS 0.3 10/31/2008 0510   BASOSABS 0.0 10/31/2008 0510                          BMET    Component Value Date/Time   NA 139 08/09/2013 1305   K 4.0 08/09/2013 1305   CL 98 08/09/2013 1305   CO2 25 08/09/2013 1305   GLUCOSE 141* 08/09/2013 1305   BUN 12 08/09/2013 1305   CREATININE 0.76 08/09/2013 1305   CALCIUM 10.0 08/09/2013 1305   GFRNONAA >90 08/09/2013 1305   GFRAA >90 08/09/2013 1305     Assessment/Plan: Patient with a nonunion and pseudoarthrosis at the C4-5 and C6-7 levels  who is admitted for a C4-C7 posterior cervical arthrodesis.  I've discussed with the patient the nature of his condition, the nature the surgical procedure, the typical length of surgery, hospital stay, and overall recuperation. We discussed limitations postoperatively. I discussed risks of surgery including risks of  infection, bleeding, possibly need for transfusion, the risk of nerve root dysfunction with pain, weakness, numbness, or paresthesias, the risk of spinal cord dysfunction with paralysis of all 4 limbs and quadriplegia, and the risk of dural tear and CSF leakage and possible need for further surgery, the risk of failure of the arthrodesis and the possible need for further surgery, and the risk of anesthetic complications including myocardial infarction, stroke, pneumonia, and death. We also discussed the need for postoperative immobilization in a cervical collar. Understanding all this the patient does wish to proceed with surgery and is admitted for such.    Hosie Spangle, MD 08/18/2013 7:47 AM

## 2013-08-19 MED ORDER — CYCLOBENZAPRINE HCL 10 MG PO TABS: 10.0000 mg | ORAL_TABLET | Freq: Three times a day (TID) | ORAL | Status: DC | PRN

## 2013-08-19 MED ORDER — OXYCODONE-ACETAMINOPHEN 5-325 MG PO TABS: 1.0000 | ORAL_TABLET | ORAL | Status: DC | PRN

## 2013-08-19 NOTE — Discharge Summary (Signed)
Physician Discharge Summary  Patient ID: Kristen [name] MRN: [id number] DOB/AGE: 1970-09-23 42 y.o.  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 Postoperatively she has done well. She is up and ambulating. She is still having some left cervical radicular pain. She is asking to be discharged to home she's been given instructions regarding wound care and activities. She is to return for followup with me in a little over 2 weeks.  Discharge Exam: Blood pressure 90/60, pulse 89, temperature 99 F (37.2 C), temperature source Oral, resp. rate 20, SpO2 95.00%.  Disposition: Home     Medication List         ALPRAZolam 0.5 MG tablet  Commonly known as:  XANAX  Take 0.25-0.5 mg by mouth 2 (two) times daily as needed for anxiety.     amphetamine-dextroamphetamine 30 MG 24 hr capsule  Commonly known as:  ADDERALL XR  Take 30 mg by mouth 2 (two) times daily.     buPROPion 150 MG 24 hr tablet  Commonly known as:  WELLBUTRIN XL  Take 450 mg by mouth every morning.     calcium carbonate 500 MG chewable tablet  Commonly known as:  TUMS - dosed in mg elemental calcium  Chew 2 tablets by mouth 2 (two) times daily with a meal.     cetirizine 10 MG tablet  Commonly known as:  ZYRTEC  Take 10 mg by mouth daily.     cyclobenzaprine 10 MG tablet  Commonly known as:  FLEXERIL  Take 1 tablet (10 mg total) by mouth 3 (three) times daily as needed for muscle spasms.     docusate sodium 100 MG capsule   Commonly known as:  COLACE  Take 300 mg by mouth every morning.     DULoxetine 60 MG capsule  Commonly known as:  CYMBALTA  Take 60 mg by mouth every morning.     gemfibrozil 600 MG tablet  Commonly known as:  LOPID  Take 600 mg by mouth 2 (two) times daily before a meal.     ibuprofen 200 MG tablet  Commonly known as:  ADVIL,MOTRIN  Take 800 mg by mouth every 6 (six) hours as needed for mild pain or moderate pain.     lamoTRIgine 200 MG tablet  Commonly known as:  LAMICTAL  Take 400 mg by mouth at bedtime.     LATUDA 120 MG Tabs  Generic drug:  Lurasidone HCl  Take 120 mg by mouth daily with supper.     oxyCODONE-acetaminophen 5-325 MG per tablet  Commonly known as:  PERCOCET/ROXICET  Take 1-2 tablets by mouth every 4 (four) hours as needed for moderate pain.     risperiDONE 4 MG disintegrating tablet  Commonly known as:  RISPERDAL M-TABS  Take 4 mg by mouth at bedtime.         SignedHosie Spangle, MD 08/19/2013, 8:08 AM

## 2013-08-19 NOTE — Discharge Instructions (Signed)

## 2013-08-19 NOTE — Progress Notes (Signed)
Pt given D/C instructions with Rx's, verbal understanding of teaching was given. Pt's IV was removed prior to D/C. Pt D/C'd home via wheelchair @ 1040 per MD order. Pt is stable @ D/C and has no other needs at this time. Holli Humbles, RN

## 2013-08-20 ENCOUNTER — Encounter (HOSPITAL_COMMUNITY): Payer: Self-pay | Admitting: Neurosurgery

## 2014-04-21 ENCOUNTER — Other Ambulatory Visit: Payer: Self-pay | Admitting: Neurosurgery

## 2014-04-21 DIAGNOSIS — S129XXS Fracture of neck, unspecified, sequela: Secondary | ICD-10-CM

## 2014-04-25 ENCOUNTER — Ambulatory Visit
Admission: RE | Admit: 2014-04-25 | Discharge: 2014-04-25 | Disposition: A | Discharge status: Home / Self Care | Payer: 59 | Source: Ambulatory Visit | Attending: Neurosurgery | Admitting: Neurosurgery

## 2014-04-25 DIAGNOSIS — S129XXS Fracture of neck, unspecified, sequela: Secondary | ICD-10-CM

## 2015-11-11 ENCOUNTER — Ambulatory Visit (INDEPENDENT_AMBULATORY_CARE_PROVIDER_SITE_OTHER): Payer: 59

## 2015-11-11 DIAGNOSIS — Z23 Encounter for immunization: Secondary | ICD-10-CM | POA: Diagnosis not present

## 2015-11-15 DIAGNOSIS — K760 Fatty (change of) liver, not elsewhere classified: Secondary | ICD-10-CM | POA: Insufficient documentation

## 2015-11-15 DIAGNOSIS — E119 Type 2 diabetes mellitus without complications: Secondary | ICD-10-CM | POA: Insufficient documentation

## 2015-11-15 DIAGNOSIS — Z87442 Personal history of urinary calculi: Secondary | ICD-10-CM | POA: Insufficient documentation

## 2015-11-15 DIAGNOSIS — F419 Anxiety disorder, unspecified: Secondary | ICD-10-CM | POA: Insufficient documentation

## 2015-11-15 DIAGNOSIS — E7849 Other hyperlipidemia: Secondary | ICD-10-CM | POA: Insufficient documentation

## 2015-12-06 DIAGNOSIS — E669 Obesity, unspecified: Secondary | ICD-10-CM | POA: Insufficient documentation

## 2015-12-06 DIAGNOSIS — IMO0001 Reserved for inherently not codable concepts without codable children: Secondary | ICD-10-CM | POA: Insufficient documentation

## 2016-03-09 DIAGNOSIS — J0101 Acute recurrent maxillary sinusitis: Secondary | ICD-10-CM | POA: Diagnosis not present

## 2016-03-11 DIAGNOSIS — R799 Abnormal finding of blood chemistry, unspecified: Secondary | ICD-10-CM | POA: Diagnosis not present

## 2016-03-11 DIAGNOSIS — E7800 Pure hypercholesterolemia, unspecified: Secondary | ICD-10-CM | POA: Diagnosis not present

## 2016-03-11 DIAGNOSIS — E119 Type 2 diabetes mellitus without complications: Secondary | ICD-10-CM | POA: Diagnosis not present

## 2016-03-11 DIAGNOSIS — E782 Mixed hyperlipidemia: Secondary | ICD-10-CM | POA: Diagnosis not present

## 2016-04-04 DIAGNOSIS — Z79899 Other long term (current) drug therapy: Secondary | ICD-10-CM | POA: Diagnosis not present

## 2016-04-15 DIAGNOSIS — Z79899 Other long term (current) drug therapy: Secondary | ICD-10-CM | POA: Diagnosis not present

## 2016-04-22 DIAGNOSIS — Z79899 Other long term (current) drug therapy: Secondary | ICD-10-CM | POA: Diagnosis not present

## 2016-05-01 DIAGNOSIS — Z79899 Other long term (current) drug therapy: Secondary | ICD-10-CM | POA: Diagnosis not present

## 2016-06-27 DIAGNOSIS — Z79899 Other long term (current) drug therapy: Secondary | ICD-10-CM | POA: Diagnosis not present

## 2016-09-17 DIAGNOSIS — Z01419 Encounter for gynecological examination (general) (routine) without abnormal findings: Secondary | ICD-10-CM | POA: Diagnosis not present

## 2016-09-17 DIAGNOSIS — Z1231 Encounter for screening mammogram for malignant neoplasm of breast: Secondary | ICD-10-CM | POA: Diagnosis not present

## 2016-10-02 DIAGNOSIS — E782 Mixed hyperlipidemia: Secondary | ICD-10-CM | POA: Diagnosis not present

## 2016-10-02 DIAGNOSIS — Z79899 Other long term (current) drug therapy: Secondary | ICD-10-CM | POA: Diagnosis not present

## 2016-10-02 DIAGNOSIS — E119 Type 2 diabetes mellitus without complications: Secondary | ICD-10-CM | POA: Diagnosis not present

## 2016-10-02 DIAGNOSIS — E7800 Pure hypercholesterolemia, unspecified: Secondary | ICD-10-CM | POA: Diagnosis not present

## 2016-10-04 DIAGNOSIS — E782 Mixed hyperlipidemia: Secondary | ICD-10-CM | POA: Diagnosis not present

## 2016-10-04 DIAGNOSIS — R799 Abnormal finding of blood chemistry, unspecified: Secondary | ICD-10-CM | POA: Diagnosis not present

## 2016-11-01 DIAGNOSIS — J0110 Acute frontal sinusitis, unspecified: Secondary | ICD-10-CM | POA: Diagnosis not present

## 2016-11-15 DIAGNOSIS — H1045 Other chronic allergic conjunctivitis: Secondary | ICD-10-CM | POA: Diagnosis not present

## 2016-11-15 DIAGNOSIS — J3089 Other allergic rhinitis: Secondary | ICD-10-CM | POA: Diagnosis not present

## 2016-11-15 DIAGNOSIS — J301 Allergic rhinitis due to pollen: Secondary | ICD-10-CM | POA: Diagnosis not present

## 2017-01-27 DIAGNOSIS — E119 Type 2 diabetes mellitus without complications: Secondary | ICD-10-CM | POA: Diagnosis not present

## 2017-01-27 DIAGNOSIS — E782 Mixed hyperlipidemia: Secondary | ICD-10-CM | POA: Diagnosis not present

## 2017-01-27 DIAGNOSIS — R799 Abnormal finding of blood chemistry, unspecified: Secondary | ICD-10-CM | POA: Diagnosis not present

## 2017-01-27 DIAGNOSIS — E7800 Pure hypercholesterolemia, unspecified: Secondary | ICD-10-CM | POA: Diagnosis not present

## 2017-01-29 DIAGNOSIS — R799 Abnormal finding of blood chemistry, unspecified: Secondary | ICD-10-CM | POA: Diagnosis not present

## 2017-01-29 DIAGNOSIS — Z23 Encounter for immunization: Secondary | ICD-10-CM | POA: Diagnosis not present

## 2017-01-29 DIAGNOSIS — Z1389 Encounter for screening for other disorder: Secondary | ICD-10-CM | POA: Diagnosis not present

## 2017-01-29 DIAGNOSIS — E782 Mixed hyperlipidemia: Secondary | ICD-10-CM | POA: Diagnosis not present

## 2017-02-05 DIAGNOSIS — L03311 Cellulitis of abdominal wall: Secondary | ICD-10-CM | POA: Diagnosis not present

## 2017-02-11 DIAGNOSIS — E782 Mixed hyperlipidemia: Secondary | ICD-10-CM | POA: Insufficient documentation

## 2017-02-21 DIAGNOSIS — R609 Edema, unspecified: Secondary | ICD-10-CM | POA: Insufficient documentation

## 2017-05-09 DIAGNOSIS — K210 Gastro-esophageal reflux disease with esophagitis: Secondary | ICD-10-CM | POA: Diagnosis not present

## 2017-05-09 DIAGNOSIS — E119 Type 2 diabetes mellitus without complications: Secondary | ICD-10-CM | POA: Diagnosis not present

## 2017-05-09 DIAGNOSIS — E7800 Pure hypercholesterolemia, unspecified: Secondary | ICD-10-CM | POA: Diagnosis not present

## 2017-05-09 DIAGNOSIS — E782 Mixed hyperlipidemia: Secondary | ICD-10-CM | POA: Diagnosis not present

## 2017-05-09 DIAGNOSIS — Z79899 Other long term (current) drug therapy: Secondary | ICD-10-CM | POA: Diagnosis not present

## 2017-05-14 DIAGNOSIS — E782 Mixed hyperlipidemia: Secondary | ICD-10-CM | POA: Diagnosis not present

## 2017-05-14 DIAGNOSIS — R799 Abnormal finding of blood chemistry, unspecified: Secondary | ICD-10-CM | POA: Diagnosis not present

## 2017-07-23 DIAGNOSIS — L03818 Cellulitis of other sites: Secondary | ICD-10-CM | POA: Diagnosis not present

## 2017-09-08 DIAGNOSIS — R05 Cough: Secondary | ICD-10-CM | POA: Diagnosis not present

## 2017-09-08 DIAGNOSIS — J0101 Acute recurrent maxillary sinusitis: Secondary | ICD-10-CM | POA: Diagnosis not present

## 2017-10-06 ENCOUNTER — Ambulatory Visit (HOSPITAL_COMMUNITY): Payer: 59 | Admitting: Psychiatry

## 2017-10-08 DIAGNOSIS — Z01419 Encounter for gynecological examination (general) (routine) without abnormal findings: Secondary | ICD-10-CM | POA: Diagnosis not present

## 2017-10-08 DIAGNOSIS — N911 Secondary amenorrhea: Secondary | ICD-10-CM | POA: Diagnosis not present

## 2017-10-14 DIAGNOSIS — R5383 Other fatigue: Secondary | ICD-10-CM | POA: Diagnosis not present

## 2017-10-14 DIAGNOSIS — E039 Hypothyroidism, unspecified: Secondary | ICD-10-CM | POA: Diagnosis not present

## 2017-11-10 DIAGNOSIS — K210 Gastro-esophageal reflux disease with esophagitis: Secondary | ICD-10-CM | POA: Diagnosis not present

## 2017-11-10 DIAGNOSIS — N183 Chronic kidney disease, stage 3 (moderate): Secondary | ICD-10-CM | POA: Diagnosis not present

## 2017-11-10 DIAGNOSIS — R739 Hyperglycemia, unspecified: Secondary | ICD-10-CM | POA: Diagnosis not present

## 2017-11-13 DIAGNOSIS — Z23 Encounter for immunization: Secondary | ICD-10-CM | POA: Diagnosis not present

## 2017-11-13 DIAGNOSIS — E6609 Other obesity due to excess calories: Secondary | ICD-10-CM | POA: Diagnosis not present

## 2017-11-14 ENCOUNTER — Telehealth: Payer: Self-pay | Admitting: Physician Assistant

## 2017-11-14 NOTE — Telephone Encounter (Signed)
Pt called regarding fax for lab results on 11/13/2017 from [place]

## 2017-11-15 NOTE — Telephone Encounter (Signed)
Chart in your box with labs   Thanks

## 2017-11-17 ENCOUNTER — Ambulatory Visit (HOSPITAL_COMMUNITY): Payer: 59 | Admitting: Psychiatry

## 2017-11-17 NOTE — Telephone Encounter (Signed)
Let her know her WBC is high but this has been high before.  It's not significant, and a SE from meds.  Plts are also high.  We should watch these labs though. Lithium level is good on 1500 mg so cont. Lamictal level is low.  Let's go up from 400 mg total to 500mg /d.  Take 1.5 pills in a.m. And 1 pill hs.   TSH is high.  Her PCP is treating that, right?   Chol is high too and check to see if Dr Quintin Alto is treating that.  Thanks.

## 2017-11-18 NOTE — Telephone Encounter (Signed)
How is she taking the Lamictal now?  It's supposed to be 200 mg bid.  (not all at hs)

## 2017-11-18 NOTE — Telephone Encounter (Signed)
Patient called & notified of results.Pt does not want to take Lamictal during the day d/t causes sleepiness.    Please advise.

## 2017-11-20 ENCOUNTER — Telehealth: Payer: Self-pay

## 2017-11-20 NOTE — Telephone Encounter (Signed)
Spoke to Pt. She was taking Lamictal  2Tabs q HS.Verbalized understanding of taking Lamictal 200mg  BID.

## 2017-12-05 ENCOUNTER — Encounter: Payer: Self-pay | Admitting: Emergency Medicine

## 2017-12-05 DIAGNOSIS — F411 Generalized anxiety disorder: Secondary | ICD-10-CM | POA: Insufficient documentation

## 2017-12-05 DIAGNOSIS — G4700 Insomnia, unspecified: Secondary | ICD-10-CM

## 2017-12-05 DIAGNOSIS — F319 Bipolar disorder, unspecified: Secondary | ICD-10-CM | POA: Insufficient documentation

## 2017-12-05 DIAGNOSIS — R251 Tremor, unspecified: Secondary | ICD-10-CM | POA: Insufficient documentation

## 2017-12-17 ENCOUNTER — Ambulatory Visit (INDEPENDENT_AMBULATORY_CARE_PROVIDER_SITE_OTHER): Payer: 59 | Admitting: Physician Assistant

## 2017-12-17 ENCOUNTER — Encounter: Payer: Self-pay | Admitting: Physician Assistant

## 2017-12-17 DIAGNOSIS — F411 Generalized anxiety disorder: Secondary | ICD-10-CM

## 2017-12-17 DIAGNOSIS — F319 Bipolar disorder, unspecified: Secondary | ICD-10-CM

## 2017-12-17 NOTE — Progress Notes (Signed)
[facility]  Patient ID: Kristen [name],  MRN: [id number]  PCP: Manon Hilding, MD  Date of Evaluation: 12/18/2017 Time spent:25 minutes  Chief Complaint:  Chief Complaint    Depression      HISTORY/CURRENT STATUS: HPI Not doing well.  Is more depressed.  She is accompanied by her husband.  For about a month, has been extremely sad.  No known trigger.  She has no motivation at all.  All she wants to do is stay in the bed.  She does not cry easily.  She does isolate.  Denies suicidal or homicidal thoughts.  No hallucinations.  We increased the Lamictal on 400 mg total daily to 500 mg total daily on October 4, due to her symptoms and lab results.  We also increase the pramipexole 1 mg twice daily on 10/18.  So far she is not feeling any better from that change.  No increased energy with decreased need for sleep, no increased libido, no increased spending, no grandiosity, no risky behavior or impulsivity.  Past medications for mental health diagnoses include: Prozac, Paxil, Lexapro, Celexa, Zoloft, Viibryd, Effexor, Cymbalta, Risperdal, Latuda, Lamictal, Depakote, lithium, Xanax, Wellbutrin, Abilify, trazodone, Strattera, Kapvay, Saphris, Adderall, Vyvanse, Lunesta, Sonata, Tegretol, Vraylar  Individual Medical History/ Review of Systems: Changes? :No   Allergies: Iohexol  Current Medications:  Current Outpatient Medications:  .  ALPRAZolam (XANAX) 1 MG tablet, Take 1 mg by mouth 3 (three) times daily as needed for anxiety., Disp: , Rfl:  .  buPROPion (WELLBUTRIN XL) 150 MG 24 hr tablet, Take 450 mg by mouth every morning. , Disp: , Rfl:  .  calcium carbonate (TUMS - DOSED IN MG ELEMENTAL CALCIUM) 500 MG chewable tablet, Chew 2 tablets by mouth 2 (two) times daily with a meal., Disp: , Rfl:  .  cetirizine (ZYRTEC) 10 MG tablet, Take 10 mg by mouth daily., Disp: , Rfl:  .  ibuprofen (ADVIL,MOTRIN) 200 MG tablet, Take 800 mg by mouth every 6 (six) hours as needed for  mild pain or moderate pain., Disp: , Rfl:  .  lamoTRIgine (LAMICTAL) 200 MG tablet, Take 500 mg by mouth daily., Disp: , Rfl:  .  lithium 300 MG tablet, Take 300 mg by mouth at bedtime. 5 qhs, Disp: , Rfl:  .  pramipexole (MIRAPEX) 1 MG tablet, Take 1 mg by mouth 2 (two) times daily., Disp: , Rfl:  .  atorvastatin (LIPITOR) 10 MG tablet, Take 10 mg by mouth daily., Disp: , Rfl: 1 .  gemfibrozil (LOPID) 600 MG tablet, Take 600 mg by mouth 2 (two) times daily before a meal., Disp: , Rfl:  .  levothyroxine (SYNTHROID, LEVOTHROID) 25 MCG tablet, Take 25 mcg by mouth daily., Disp: , Rfl: 2 .  modafinil (PROVIGIL) 200 MG tablet, Take 0.5 tablets (100 mg total) by mouth daily., Disp: 30 tablet, Rfl: 0 .  oxyCODONE-acetaminophen (PERCOCET/ROXICET) 5-325 MG per tablet, Take 1-2 tablets by mouth every 4 (four) hours as needed for moderate pain. (Patient not taking: Reported on 12/17/2017), Disp: 75 tablet, Rfl: 0 Medication Side Effects: none  Family Medical/ Social History: Changes? No  MENTAL HEALTH EXAM:  There were no vitals taken for this visit.There is no height or weight on file to calculate BMI.  General Appearance: Well Groomed and Obese  Eye Contact:  Good  Speech:  Clear and Coherent  Volume:  Normal  Mood:  Depressed  Affect:  Depressed  Thought Process:  Goal Directed  Orientation:  Full (Time,  Place, and Person)  Thought Content: Logical   Suicidal Thoughts:  No  Homicidal Thoughts:  No  Memory:  WNL  Judgement:  Good  Insight:  Good  Psychomotor Activity:  Normal  Concentration:  Concentration: Good  Recall:  Good  Fund of Knowledge: Good  Language: Good  Assets:  Desire for Improvement  ADL's:  Intact  Cognition: WNL  Prognosis:  Good  Labs done at her PCP in September (I do not have the specific date) showed lithium level was normal, Lamictal was low normal, see other labs on chart.  DIAGNOSES:    ICD-10-CM   1. Bipolar disorder with depression ([place]) F31.9   2.  Generalized anxiety disorder F41.1   New diagnosis of hypothyroidism  Receiving Psychotherapy: No    RECOMMENDATIONS: I spent 25 minutes with her and 50% of that time was spent in counseling. Recommend we continue all medications as noted above with the addition of Provigil 200 mg half p.o. every morning.  This will help stimulate her and hopefully help the depression fairly quickly, while the other medications are starting to take effect. We did not discuss other options but may need to at the next visit, including [place], [place], [place] Strongly recommend lifestyle changes such as routine exercise and healthy eating. Return in 4 weeks or sooner as needed.  [name], PA-C

## 2017-12-18 MED ORDER — MODAFINIL 200 MG PO TABS: 100.0000 mg | ORAL_TABLET | Freq: Every day | ORAL | 0 refills | Status: DC

## 2017-12-26 ENCOUNTER — Other Ambulatory Visit: Payer: Self-pay

## 2017-12-26 ENCOUNTER — Telehealth: Payer: Self-pay | Admitting: Physician Assistant

## 2017-12-26 ENCOUNTER — Other Ambulatory Visit: Payer: Self-pay | Admitting: Physician Assistant

## 2017-12-26 MED ORDER — MODAFINIL 200 MG PO TABS: 200.0000 mg | ORAL_TABLET | Freq: Every day | ORAL | 1 refills | Status: DC

## 2017-12-26 NOTE — Telephone Encounter (Signed)
Refill request Modafinil 200 mg takes 1 daily. Pharmacy  [facility] Next appt. 02/10/18. Meds. working well states pt.

## 2018-01-20 ENCOUNTER — Ambulatory Visit: Payer: 59 | Admitting: Physician Assistant

## 2018-01-21 DIAGNOSIS — C9000 Multiple myeloma not having achieved remission: Secondary | ICD-10-CM | POA: Insufficient documentation

## 2018-01-26 ENCOUNTER — Other Ambulatory Visit: Payer: Self-pay | Admitting: Physician Assistant

## 2018-02-02 DIAGNOSIS — Z6834 Body mass index (BMI) 34.0-34.9, adult: Secondary | ICD-10-CM | POA: Diagnosis not present

## 2018-02-02 DIAGNOSIS — J019 Acute sinusitis, unspecified: Secondary | ICD-10-CM | POA: Diagnosis not present

## 2018-02-06 ENCOUNTER — Other Ambulatory Visit: Payer: Self-pay

## 2018-02-06 MED ORDER — LAMOTRIGINE 200 MG PO TABS: 500.0000 mg | ORAL_TABLET | Freq: Every day | ORAL | 0 refills | Status: DC

## 2018-02-10 ENCOUNTER — Encounter: Payer: Self-pay | Admitting: Physician Assistant

## 2018-02-10 ENCOUNTER — Ambulatory Visit (INDEPENDENT_AMBULATORY_CARE_PROVIDER_SITE_OTHER): Payer: 59 | Admitting: Physician Assistant

## 2018-02-10 DIAGNOSIS — Z79899 Other long term (current) drug therapy: Secondary | ICD-10-CM | POA: Diagnosis not present

## 2018-02-10 DIAGNOSIS — R251 Tremor, unspecified: Secondary | ICD-10-CM | POA: Diagnosis not present

## 2018-02-10 DIAGNOSIS — F319 Bipolar disorder, unspecified: Secondary | ICD-10-CM

## 2018-02-10 DIAGNOSIS — F411 Generalized anxiety disorder: Secondary | ICD-10-CM

## 2018-02-10 NOTE — Progress Notes (Signed)
[facility]  Patient ID: Kristen [name],  MRN: [id number]  PCP: Manon Hilding, MD  Date of Evaluation: 02/10/2018 Time spent:25 minutes  Chief Complaint:  Chief Complaint    Follow-up      HISTORY/CURRENT STATUS: HPI for 6 week med check.  Accompanied by husband.  "Not depressed all the time but not happy all the time." Does have periods of happiness but most of the time just 'flat.'  Likes to stay in her pjs, doesn't want to go anywhere.  But before we started the Provigil, she wasn't getting out of bed at all. "maybe it has helped."  She does get out of bed now at least to go to the living room.  She is excited about their son coming home in a couple of days.  He is in the [place] in [place].  She has planned late Christmas celebration and is excited about that.  She has also planned other activities, making his favorite meals, etc.  "I guess I am a little better because I would not be able to do these things a couple of months ago.  I would have been excited that he was coming home but I would not feel like doing anything to make his visit special."  She does not cry easily.  She sleeps okay and not too much or too little.  She feels rested during the day but still very fatigued until she gets up and gets going.  Patient denies increased energy with decreased need for sleep, no increased talkativeness, no racing thoughts, no impulsivity or risky behaviors, no increased spending, no increased libido, no grandiosity.  Reports having mild tremor still.  It does not really bother her though.  It is only in her hands.  The only time it is a problem is when she is trying to write, usually in cursive.  She still able to cook and things like that.  Gait is normal.  Reports no falls.  No tics.  He is controlled as long as she has the Xanax.  Individual Medical History/ Review of Systems: Changes? :No    Past medications for mental health diagnoses  include: Prozac, Paxil, Lexapro, Celexa, Zoloft, Viibryd, Effexor, Cymbalta, Risperdal, Latuda, Lamictal, Depakote, lithium, Xanax, Wellbutrin, Abilify, trazodone, Strattera, Kapvay, Saphris, Adderall, Vyvanse, Lunesta, Sonata, carbamazepine, Vraylar  Allergies: Iohexol  Current Medications:  Current Outpatient Medications:  .  ALPRAZolam (XANAX) 1 MG tablet, Take 1 mg by mouth 3 (three) times daily as needed for anxiety., Disp: , Rfl:  .  atorvastatin (LIPITOR) 10 MG tablet, Take 10 mg by mouth daily., Disp: , Rfl: 1 .  buPROPion (WELLBUTRIN XL) 150 MG 24 hr tablet, TAKE 3 TABLETS BY MOUTH IN THE MORNING, Disp: 90 tablet, Rfl: 0 .  calcium carbonate (TUMS - DOSED IN MG ELEMENTAL CALCIUM) 500 MG chewable tablet, Chew 2 tablets by mouth 2 (two) times daily with a meal., Disp: , Rfl:  .  cetirizine (ZYRTEC) 10 MG tablet, Take 10 mg by mouth daily., Disp: , Rfl:  .  ibuprofen (ADVIL,MOTRIN) 200 MG tablet, Take 800 mg by mouth every 6 (six) hours as needed for mild pain or moderate pain., Disp: , Rfl:  .  lamoTRIgine (LAMICTAL) 200 MG tablet, Take 2.5 tablets (500 mg total) by mouth daily., Disp: 75 tablet, Rfl: 0 .  levothyroxine (SYNTHROID, LEVOTHROID) 50 MCG tablet, Take 50 mcg by mouth daily., Disp: , Rfl: 3 .  lithium 300 MG tablet, Take 300 mg by mouth  at bedtime. 5 qhs, Disp: , Rfl:  .  modafinil (PROVIGIL) 200 MG tablet, Take 1 tablet (200 mg total) by mouth daily., Disp: 30 tablet, Rfl: 1 Medication Side Effects: none  Family Medical/ Social History: Changes? No  MENTAL HEALTH EXAM:  There were no vitals taken for this visit.There is no height or weight on file to calculate BMI.  General Appearance: Casual, Well Groomed and Obese  Eye Contact:  Good she smiles more than she has recently  Speech:  Clear and Coherent  Volume:  Normal  Mood:  Euthymic  Affect:  Appropriate  Thought Process:  Goal Directed  Orientation:  Full (Time, Place, and Person)  Thought Content: Logical    Suicidal Thoughts:  No  Homicidal Thoughts:  No  Memory:  WNL  Judgement:  Good  Insight:  Good  Psychomotor Activity: Mild bilateral fine motor tremor in her hands.  Concentration:  Concentration: Good and Attention Span: Good  Recall:  Good  Fund of Knowledge: Good  Language: Good  Assets:  Desire for Improvement  ADL's:  Intact  Cognition: WNL  Prognosis:  Good    DIAGNOSES:    ICD-10-CM   1. Bipolar I disorder ([place]) F31.9   2. Encounter for long-term (current) use of medications Z79.899 Lamotrigine level    Lithium level    TSH    Comprehensive metabolic panel  3. Generalized anxiety disorder F41.1   4. Tremor R25.1     Receiving Psychotherapy: No    RECOMMENDATIONS: It seems to me that she is at least 20% better.  She is at least able to get out of bed most days and even though she enjoys staying home in her pajamas and doing nothing, she is at least planning things and is excited about her son coming home for a visit.  I believe the Provigil has helped so we will continue that. We will check labs and might be able to increase the Lamictal a little more which will help with the depressive symptoms. We will consider Artane or Cogentin if needed for the tremor.  I have added ginkgo biloba and hopefully that will help. Continue all other medications. Return in approximately 6 weeks.   [name], PA-C

## 2018-02-10 NOTE — Patient Instructions (Signed)
Gingko Biloba buy over the counter to help with tremor.

## 2018-02-17 DIAGNOSIS — R799 Abnormal finding of blood chemistry, unspecified: Secondary | ICD-10-CM | POA: Diagnosis not present

## 2018-02-24 NOTE — Progress Notes (Signed)
Pt given lab results and verified medications, instructed to continue current doses. Keep follow up in February as scheduled.

## 2018-02-25 ENCOUNTER — Other Ambulatory Visit: Payer: Self-pay | Admitting: Physician Assistant

## 2018-02-26 ENCOUNTER — Other Ambulatory Visit: Payer: Self-pay | Admitting: Physician Assistant

## 2018-02-26 ENCOUNTER — Telehealth: Payer: Self-pay | Admitting: Physician Assistant

## 2018-02-26 MED ORDER — PRAMIPEXOLE DIHYDROCHLORIDE 1 MG PO TABS: 2.0000 mg | ORAL_TABLET | Freq: Every day | ORAL | 5 refills | Status: DC

## 2018-02-26 NOTE — Telephone Encounter (Signed)
Pt requesting medication, but looks like you took her off 12/31.  Talked to pt and she said she was still taking as prescribed. Please advise and I can send in

## 2018-02-26 NOTE — Telephone Encounter (Signed)
done

## 2018-02-26 NOTE — Telephone Encounter (Signed)
PATIENT STATED SHE HAS NO REFILLS AUTHORIZED AT HER PHARMACY ([facility] IN [place])FOR PRAMIPEXOLE 1 MG SHE TAKES 2 @ NIGHT DAILY

## 2018-02-27 ENCOUNTER — Telehealth: Payer: Self-pay | Admitting: Physician Assistant

## 2018-02-27 NOTE — Telephone Encounter (Signed)
Has OV Monday and is ok to discuss then

## 2018-02-27 NOTE — Telephone Encounter (Signed)
Patient inquiring about a earlier appointment(2/11). Nothing is available. Would like to speak with Kristen [name] with some med issues.

## 2018-03-02 ENCOUNTER — Encounter: Payer: Self-pay | Admitting: Physician Assistant

## 2018-03-02 ENCOUNTER — Ambulatory Visit (INDEPENDENT_AMBULATORY_CARE_PROVIDER_SITE_OTHER): Payer: 59 | Admitting: Physician Assistant

## 2018-03-02 VITALS — BP 138/78 | HR 78

## 2018-03-02 DIAGNOSIS — R251 Tremor, unspecified: Secondary | ICD-10-CM | POA: Diagnosis not present

## 2018-03-02 DIAGNOSIS — F3112 Bipolar disorder, current episode manic without psychotic features, moderate: Secondary | ICD-10-CM | POA: Diagnosis not present

## 2018-03-02 MED ORDER — QUETIAPINE FUMARATE ER 150 MG PO TB24: ORAL_TABLET | ORAL | 0 refills | Status: DC
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
## 2018-03-04 ENCOUNTER — Other Ambulatory Visit: Payer: Self-pay | Admitting: Physician Assistant

## 2018-03-04 NOTE — Telephone Encounter (Signed)
Last fill 12/29

## 2018-03-05 ENCOUNTER — Other Ambulatory Visit: Payer: Self-pay | Admitting: Physician Assistant

## 2018-03-06 ENCOUNTER — Telehealth: Payer: Self-pay | Admitting: Physician Assistant

## 2018-03-06 NOTE — Telephone Encounter (Signed)
Pt called reporting the pharm said they could not fill Xanax rx over the phone because it has to be a taper.  Please mail Rx to home address.

## 2018-03-06 NOTE — Telephone Encounter (Signed)
Please review message

## 2018-03-06 NOTE — Telephone Encounter (Signed)
Traci, I'm not sure what she's talking about with this.  There's nothing going on with a taper.  Call on Monday to discuss.

## 2018-03-09 NOTE — Telephone Encounter (Signed)
Confusion with pharmacy but pt has her rx now and doesn't need anything.

## 2018-03-11 ENCOUNTER — Telehealth: Payer: Self-pay | Admitting: Physician Assistant

## 2018-03-11 NOTE — Telephone Encounter (Signed)
Pt called started Seroquel around 03/03/2018 150 mg for 1 week then 300 mg thereafter. Meds are working however feels dose too high. Wakes up feeling drunk. Please advise.

## 2018-03-11 NOTE — Telephone Encounter (Signed)
Is she still on the 150mg ?  If so stay at that, or go back to that if she increased to 300mg .  If too drowsy at 150mg , cut it in half for a week, then go back to 150mg .  Let her know we really want dose to be at 150 if she can tolerate it, b/c it's more therapeutic.

## 2018-03-11 NOTE — Telephone Encounter (Signed)
Pt taking currently taking 300mg , instructed to go back down to 150mg , states that dose doesn't make her drowsy. Call back with any problems.

## 2018-03-11 NOTE — Telephone Encounter (Signed)
Please review

## 2018-03-24 ENCOUNTER — Other Ambulatory Visit: Payer: Self-pay | Admitting: Physician Assistant

## 2018-03-24 ENCOUNTER — Ambulatory Visit: Payer: 59 | Admitting: Physician Assistant

## 2018-03-25 DIAGNOSIS — R7301 Impaired fasting glucose: Secondary | ICD-10-CM | POA: Diagnosis not present

## 2018-03-25 DIAGNOSIS — E039 Hypothyroidism, unspecified: Secondary | ICD-10-CM | POA: Diagnosis not present

## 2018-03-25 DIAGNOSIS — R739 Hyperglycemia, unspecified: Secondary | ICD-10-CM | POA: Diagnosis not present

## 2018-04-02 ENCOUNTER — Encounter: Payer: Self-pay | Admitting: Physician Assistant

## 2018-04-02 ENCOUNTER — Ambulatory Visit: Payer: 59 | Admitting: Physician Assistant

## 2018-04-02 VITALS — BP 135/89 | HR 78

## 2018-04-02 DIAGNOSIS — R251 Tremor, unspecified: Secondary | ICD-10-CM

## 2018-04-02 DIAGNOSIS — F319 Bipolar disorder, unspecified: Secondary | ICD-10-CM | POA: Diagnosis not present

## 2018-04-02 DIAGNOSIS — F411 Generalized anxiety disorder: Secondary | ICD-10-CM

## 2018-04-02 MED ORDER — QUETIAPINE FUMARATE ER 150 MG PO TB24: 150.0000 mg | ORAL_TABLET | Freq: Every day | ORAL | 5 refills | Status: DC

## 2018-04-02 MED ORDER — ALPRAZOLAM 1 MG PO TABS: 1.0000 mg | ORAL_TABLET | Freq: Three times a day (TID) | ORAL | 5 refills | Status: DC | PRN

## 2018-04-02 MED ORDER — PROPRANOLOL HCL 20 MG PO TABS: 20.0000 mg | ORAL_TABLET | Freq: Two times a day (BID) | ORAL | 1 refills | Status: DC

## 2018-04-02 MED ORDER — LITHIUM CARBONATE 300 MG PO TABS: 1500.0000 mg | ORAL_TABLET | Freq: Every day | ORAL | 5 refills | Status: DC

## 2018-04-02 MED ORDER — LAMOTRIGINE 200 MG PO TABS: 500.0000 mg | ORAL_TABLET | Freq: Every day | ORAL | 5 refills | Status: DC

## 2018-04-02 NOTE — Progress Notes (Signed)
[facility]  Patient ID: Kristen [name],  MRN: [id number]  PCP: Manon Hilding, MD  Date of Evaluation: 04/02/2018 Time spent:25 minutes  Chief Complaint:  Chief Complaint    Follow-up      HISTORY/CURRENT STATUS: HPI Here for routine med.  At [place], we increased the Seroquel but she couldn't tolerate it.  Caused her to be so drowsy she couldn't take it.  Still has tremor.  Gingko biloba hasn't helped so she stopped it. Tremor can be so bad that she can't hardly write.  Patient denies loss of interest in usual activities and is able to enjoy things.  Denies decreased energy or motivation.  Appetite has not changed.  Denies any changes in concentration, making decisions or remembering things.  Denies suicidal or homicidal thoughts. Sad b/c son in [place] won't contact with texts or anything.  Also sad about her Father's dx with metastatic colon CA.   Sleeps better w/ Seroquel.  Feels better than she did a few months ago with depression.  More able to get a shower and get dressed and want to do things.  Has been doing things around her house, not manic though.  She recently went to see her father as he participated in a Veteran's group event.  He was honored by getting a quilt with the purple hard on it.  He was a Company secretary and fought in [place].  Patient denies increased energy with decreased need for sleep, no increased talkativeness, no racing thoughts, no impulsivity or risky behaviors, no increased spending, no increased libido, no grandiosity.  Anxiety is well-controlled for the most part. She sometimes feels an anxiety attack coming on and can take a Xanax and it will help.  Uses it twice a day for sure.    Denies muscle or joint pain, stiffness, or dystonia.  Denies dizziness, syncope, seizures, numbness, tingling, tics, unsteady gait, slurred speech, confusion. Has tremor in hands bilat.   Individual Medical History/ Review of Systems: Changes? :No    Past  medications for mental health diagnoses include: Prozac, Paxil, Lexapro, Celexa, Zoloft, Viibryd, Effexor, Cymbalta, Risperdal, Latuda, Lamictal, VPA, Li, Xanax, Wellbutrin, Abilify, [place], Kapvay, Saphris, Adderall, Vyvanse, Lunesta, Sonata, CBZ, Vraylar, Seroquel  Allergies: Iohexol  Current Medications:  Current Outpatient Medications:  .  ALPRAZolam (XANAX) 1 MG tablet, Take 1 tablet (1 mg total) by mouth 3 (three) times daily as needed. for anxiety, Disp: 90 tablet, Rfl: 5 .  atorvastatin (LIPITOR) 10 MG tablet, Take 10 mg by mouth daily., Disp: , Rfl: 1 .  buPROPion (WELLBUTRIN XL) 150 MG 24 hr tablet, TAKE 3 TABLETS BY MOUTH IN THE MORNING, Disp: 90 tablet, Rfl: 5 .  calcium carbonate (TUMS - DOSED IN MG ELEMENTAL CALCIUM) 500 MG chewable tablet, Chew 2 tablets by mouth 2 (two) times daily with a meal., Disp: , Rfl:  .  cetirizine (ZYRTEC) 10 MG tablet, Take 10 mg by mouth daily., Disp: , Rfl:  .  ibuprofen (ADVIL,MOTRIN) 200 MG tablet, Take 800 mg by mouth every 6 (six) hours as needed for mild pain or moderate pain., Disp: , Rfl:  .  lamoTRIgine (LAMICTAL) 200 MG tablet, Take 2.5 tablets (500 mg total) by mouth daily., Disp: 75 tablet, Rfl: 5 .  levothyroxine (SYNTHROID, LEVOTHROID) 50 MCG tablet, Take 50 mcg by mouth daily., Disp: , Rfl: 3 .  lithium 300 MG tablet, Take 5 tablets (1,500 mg total) by mouth at bedtime. 5 qhs, Disp: 150 tablet, Rfl: 5 .  modafinil (PROVIGIL) 200 MG tablet, TAKE 1 TABLET BY MOUTH ONCE DAILY, Disp: 30 tablet, Rfl: 5 .  montelukast (SINGULAIR) 10 MG tablet, Take by mouth., Disp: , Rfl:  .  pramipexole (MIRAPEX) 1 MG tablet, Take 2 tablets (2 mg total) by mouth at bedtime., Disp: 60 tablet, Rfl: 5 .  QUEtiapine Fumarate (SEROQUEL XR) 150 MG 24 hr tablet, Take 1 tablet (150 mg total) by mouth at bedtime., Disp: 30 tablet, Rfl: 5 .  propranolol (INDERAL) 20 MG tablet, Take 1 tablet (20 mg total) by mouth 2 (two) times daily. 1 qhs for 4 days, then  bid., Disp: 60 tablet, Rfl: 1 Medication Side Effects: none  Family Medical/ Social History: Changes? Dad had colon cancer a few years ago and has recently been dx with bone mets.  Unknown tx yet.  Mom had aortic valve replaced recently.    MENTAL HEALTH EXAM:  Blood pressure 135/89, pulse 78.There is no height or weight on file to calculate BMI.  General Appearance: Casual, Well Groomed and Obese  Eye Contact:  Good  Speech:  Clear and Coherent  Volume:  Normal  Mood:  Euthymic  Affect:  Appropriate  Thought Process:  Goal Directed  Orientation:  Full (Time, Place, and Person)  Thought Content: Logical   Suicidal Thoughts:  No  Homicidal Thoughts:  No  Memory:  WNL  Judgement:  Good  Insight:  Good  Psychomotor Activity:  Tremor mild in bilateral hands with arm extension.  No other tremor, gait abnormalities, or TD is present.  Concentration:  Concentration: Good  Recall:  Good  Fund of Knowledge: Good  Language: Good  Assets:  Desire for Improvement  ADL's:  Intact  Cognition: WNL  Prognosis:  Good    DIAGNOSES:    ICD-10-CM   1. Tremor R25.1   2. Bipolar I disorder ([place]) F31.9   3. Generalized anxiety disorder F41.1     Receiving Psychotherapy: Yes Rosette Reveal    RECOMMENDATIONS: I spent 25 minutes with her and 50% of that time was in counseling reference medication options concerning the tremor. We had talked previously about starting propranolol for the tremor.  She would like to try that.  Her sister is a PA and she is discussed that with her.  She also thinks it is a good idea. Start propranolol 20 mg p.o. nightly for 4 days, then twice daily.  She knows to let me know if she has dizziness, extreme fatigue, shortness of breath or other problems. Continue Xanax 1 mg 3 times daily as needed. Continue Wellbutrin 150 mg, 3 daily. Continue Lamictal total of 500 mg daily. Continue lithium 1500 mg nightly. Continue levothyroxine 50 mcg daily. Continue modafinil  200 mg daily. Continue pramipexole 1 mg, 2 nightly. Continue Seroquel XR 150 mg nightly. Continue psychotherapy with Rosette Reveal She will sign a release form so I can get labs from her PCP that were drawn earlier this month. Return in 6 weeks.  [name], PA-C

## 2018-04-06 DIAGNOSIS — R799 Abnormal finding of blood chemistry, unspecified: Secondary | ICD-10-CM | POA: Diagnosis not present

## 2018-04-06 DIAGNOSIS — E782 Mixed hyperlipidemia: Secondary | ICD-10-CM | POA: Diagnosis not present

## 2018-04-08 ENCOUNTER — Telehealth: Payer: Self-pay | Admitting: Physician Assistant

## 2018-04-08 NOTE — Telephone Encounter (Signed)
Patient waiting for results of her Lithium level. Please call.

## 2018-04-09 NOTE — Telephone Encounter (Signed)
Given results of labs drawn at [facility] office. Will fax what is recommended by the provider as well.

## 2018-04-09 NOTE — Telephone Encounter (Signed)
I have paper copies.  Need to review from old labs.  Will get it to you today w/ msg for pt.

## 2018-04-10 ENCOUNTER — Other Ambulatory Visit: Payer: Self-pay | Admitting: Physician Assistant

## 2018-04-10 DIAGNOSIS — Z79899 Other long term (current) drug therapy: Secondary | ICD-10-CM

## 2018-04-23 DIAGNOSIS — Z79899 Other long term (current) drug therapy: Secondary | ICD-10-CM | POA: Diagnosis not present

## 2018-04-23 DIAGNOSIS — N183 Chronic kidney disease, stage 3 (moderate): Secondary | ICD-10-CM | POA: Diagnosis not present

## 2018-04-27 ENCOUNTER — Telehealth: Payer: Self-pay | Admitting: Physician Assistant

## 2018-04-27 NOTE — Telephone Encounter (Signed)
Patient had calcium level re checked last week wants to know results of test it was high not sure if you would do anything if it calcium levels were still high

## 2018-04-27 NOTE — Telephone Encounter (Signed)
Can you get her labs?  Not in chart yet.

## 2018-04-28 ENCOUNTER — Other Ambulatory Visit: Payer: Self-pay | Admitting: Physician Assistant

## 2018-04-28 DIAGNOSIS — Z79899 Other long term (current) drug therapy: Secondary | ICD-10-CM

## 2018-04-28 NOTE — Telephone Encounter (Signed)
Calcium is much better at 10.4 (was 11.2) but is right at top end of normal, so will cont to watch it.  Creatinine is down from 1.12 to 0.94, which is nl.  Let's repeat labs in 3 months. I'll order labs in Epic and can we mail her the orders?  I'm never sure the right way to do it.

## 2018-04-28 NOTE — Telephone Encounter (Signed)
Called pt to let her know we have not received any, she states she will call and have them faxed over

## 2018-04-28 NOTE — Telephone Encounter (Signed)
Hand written rx mailed to pt.

## 2018-04-28 NOTE — Telephone Encounter (Signed)
Did the orders print out up there?  I put them in the system.  If not, let me know and I'll write them out.

## 2018-04-28 NOTE — Telephone Encounter (Signed)
Pt aware and verbalized understanding. Asked the lab order be mailed to her.

## 2018-05-13 ENCOUNTER — Ambulatory Visit: Payer: 59 | Admitting: Physician Assistant

## 2018-05-26 DIAGNOSIS — Z79899 Other long term (current) drug therapy: Secondary | ICD-10-CM | POA: Diagnosis not present

## 2018-06-03 ENCOUNTER — Other Ambulatory Visit: Payer: Self-pay

## 2018-06-03 ENCOUNTER — Ambulatory Visit (INDEPENDENT_AMBULATORY_CARE_PROVIDER_SITE_OTHER): Payer: 59 | Admitting: Physician Assistant

## 2018-06-03 ENCOUNTER — Encounter: Payer: Self-pay | Admitting: Physician Assistant

## 2018-06-03 DIAGNOSIS — F319 Bipolar disorder, unspecified: Secondary | ICD-10-CM

## 2018-06-03 DIAGNOSIS — F411 Generalized anxiety disorder: Secondary | ICD-10-CM | POA: Diagnosis not present

## 2018-06-03 DIAGNOSIS — E032 Hypothyroidism due to medicaments and other exogenous substances: Secondary | ICD-10-CM

## 2018-06-03 DIAGNOSIS — R251 Tremor, unspecified: Secondary | ICD-10-CM | POA: Diagnosis not present

## 2018-06-03 DIAGNOSIS — E039 Hypothyroidism, unspecified: Secondary | ICD-10-CM | POA: Insufficient documentation

## 2018-06-03 DIAGNOSIS — Z79899 Other long term (current) drug therapy: Secondary | ICD-10-CM | POA: Diagnosis not present

## 2018-06-03 MED ORDER — PROPRANOLOL HCL 20 MG PO TABS: 20.0000 mg | ORAL_TABLET | Freq: Every day | ORAL | 5 refills | Status: DC

## 2018-06-03 NOTE — Progress Notes (Signed)
[facility]  Patient ID: Kristen [name],  MRN: [id number]  PCP: Manon Hilding, MD  Date of Evaluation: 06/03/2018 Time spent:15 minutes  Chief Complaint:  Chief Complaint    Follow-up     Virtual Visit via Telephone Note  I connected with patient by a video enabled telemedicine application or telephone, with their informed consent, and verified patient privacy and that I am speaking with the correct person using two identifiers.  I am private, in my home and the patient is in her home.   I discussed the limitations, risks, security and privacy concerns of performing an evaluation and management service by telephone and the availability of in person appointments. I also discussed with the patient that there may be a patient responsible charge related to this service. The patient expressed understanding and agreed to proceed.   I discussed the assessment and treatment plan with the patient. The patient was provided an opportunity to ask questions and all were answered. The patient agreed with the plan and demonstrated an understanding of the instructions.   The patient was advised to call back or seek an in-person evaluation if the symptoms worsen or if the condition fails to improve as anticipated.  I provided 15  minutes of non-face-to-face time during this encounter.  HISTORY/CURRENT STATUS: HPI presents for 39-month med check.    Tremor has decreased a lot. Going on Propranolol has been very helpful.  Unable to take it bid b/c the a.m. dose made her feel really weak, tired and felt like she could not talk right.  "  I almost felt drunk.  I stop taking the morning dose and only take it at night now.  It really helps a lot with the tremor.  It is not completely gone but is much better than it was.  When I take the pill only at night, I do not have any of those strange feelings during the day."  Denies dizziness  States her moods are pretty good.  Even with everything  going on with the coronavirus pandemic, she does not have a lot of anxiety or depression from the circumstances.  She is staying in most of the time unless she has to go to the grocery store.  Patient denies loss of interest in usual activities and is able to enjoy things.  Denies decreased energy or motivation.  Appetite has not changed.  No extreme sadness, tearfulness, or feelings of hopelessness.  Denies any changes in concentration, making decisions or remembering things.  Denies suicidal or homicidal thoughts.  Patient denies increased energy with decreased need for sleep, no increased talkativeness, no racing thoughts, no impulsivity or risky behaviors, no increased spending, no increased libido, no grandiosity.  Denies muscle or joint pain, stiffness, or dystonia.  Denies dizziness, syncope, seizures, numbness, tingling, tics, unsteady gait, slurred speech, confusion.   Individual Medical History/ Review of Systems: Changes? :Yes allergies  Past medications for mental health diagnoses include: Prozac, Paxil, Lexapro, Celexa, Zoloft, Viibryd, Effexor, Cymbalta, Risperdal, Latuda, Lamictal, VPA, Li, Xanax, Wellbutrin, Abilify, [place], Kapvay, Saphris, Adderall, Vyvanse, Lunesta, Sonata, CBZ, Vraylar, Seroquel  Allergies: Iohexol  Current Medications:  Current Outpatient Medications:  .  ALPRAZolam (XANAX) 1 MG tablet, Take 1 tablet (1 mg total) by mouth 3 (three) times daily as needed. for anxiety, Disp: 90 tablet, Rfl: 5 .  atorvastatin (LIPITOR) 10 MG tablet, Take 10 mg by mouth daily., Disp: , Rfl: 1 .  buPROPion (WELLBUTRIN XL) 150 MG 24 hr  tablet, TAKE 3 TABLETS BY MOUTH IN THE MORNING, Disp: 90 tablet, Rfl: 5 .  calcium carbonate (TUMS - DOSED IN MG ELEMENTAL CALCIUM) 500 MG chewable tablet, Chew 2 tablets by mouth 2 (two) times daily with a meal., Disp: , Rfl:  .  cetirizine (ZYRTEC) 10 MG tablet, Take 10 mg by mouth daily., Disp: , Rfl:  .  ibuprofen (ADVIL,MOTRIN) 200  MG tablet, Take 800 mg by mouth every 6 (six) hours as needed for mild pain or moderate pain., Disp: , Rfl:  .  lamoTRIgine (LAMICTAL) 200 MG tablet, Take 2.5 tablets (500 mg total) by mouth daily., Disp: 75 tablet, Rfl: 5 .  levothyroxine (SYNTHROID, LEVOTHROID) 50 MCG tablet, Take 75 mcg by mouth daily. , Disp: , Rfl: 3 .  lithium 300 MG tablet, Take 5 tablets (1,500 mg total) by mouth at bedtime. 5 qhs, Disp: 150 tablet, Rfl: 5 .  modafinil (PROVIGIL) 200 MG tablet, TAKE 1 TABLET BY MOUTH ONCE DAILY, Disp: 30 tablet, Rfl: 5 .  montelukast (SINGULAIR) 10 MG tablet, Take by mouth., Disp: , Rfl:  .  pramipexole (MIRAPEX) 1 MG tablet, Take 2 tablets (2 mg total) by mouth at bedtime., Disp: 60 tablet, Rfl: 5 .  propranolol (INDERAL) 20 MG tablet, Take 1 tablet (20 mg total) by mouth at bedtime., Disp: 30 tablet, Rfl: 5 .  QUEtiapine Fumarate (SEROQUEL XR) 150 MG 24 hr tablet, Take 1 tablet (150 mg total) by mouth at bedtime., Disp: 30 tablet, Rfl: 5 Medication Side Effects: none  Family Medical/ Social History: Changes? No  MENTAL HEALTH EXAM:  There were no vitals taken for this visit.There is no height or weight on file to calculate BMI.  General Appearance: Phone visit unable to assess  Eye Contact:   unable to assess  Speech:  Clear and Coherent  Volume:  Normal  Mood:  Euthymic  Affect:  Unable to assess  Thought Process:  Goal Directed  Orientation:  Full (Time, Place, and Person)  Thought Content: Logical   Suicidal Thoughts:  No  Homicidal Thoughts:  No  Memory:  WNL  Judgement:  Good  Insight:  Good  Psychomotor Activity:  Unable to assess  Concentration:  Concentration: Good  Recall:  Good  Fund of Knowledge: Good  Language: Good  Assets:  Desire for Improvement  ADL's:  Intact  Cognition: WNL  Prognosis:  Good  Labs drawn 05/26/2018 TSH is high at 10.9.  Dr. Quintin Alto has already increased her Synthroid.  CMP is normal.  Creatinine is at 1.03.  Her calcium has dropped to  10.1.  Lithium level is 0.9.  Glucose is 77, LFTs are within normal limits.  DIAGNOSES:    ICD-10-CM   1. Bipolar I disorder ([place]) F31.9   2. Generalized anxiety disorder F41.1   3. Tremor R25.1   4. Encounter for long-term (current) use of medications Z79.899 TSH    Comprehensive metabolic panel    Lithium level  5. 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 Continue Xanax 1 mg 3 times daily as needed. Continue Wellbutrin XL 450 mg daily. Continue Lamictal 500 mg daily. Continue Synthroid 75 mcg daily, per Dr. Quintin Alto. Continue lithium 1500 mg daily. Continue Provigil 200 mg daily. Continue Mirapex 2 mg nightly. Continue propranolol 20 mg nightly. Continue Seroquel XR 150 mg nightly Draw TSH, CMP, and lithium level in 3 months. Return in 3 months  [name], PA-C   This record has been created using [employer].  Chart creation errors have been sought, but may not always have been located and corrected. Such creation errors do not reflect on the standard of medical care.

## 2018-06-12 DIAGNOSIS — Z8619 Personal history of other infectious and parasitic diseases: Secondary | ICD-10-CM

## 2018-06-12 HISTORY — DX: Personal history of other infectious and parasitic diseases: Z86.19

## 2018-06-19 ENCOUNTER — Telehealth: Payer: Self-pay | Admitting: Physician Assistant

## 2018-06-19 ENCOUNTER — Other Ambulatory Visit: Payer: Self-pay | Admitting: Physician Assistant

## 2018-06-19 MED ORDER — QUETIAPINE FUMARATE ER 300 MG PO TB24: 300.0000 mg | ORAL_TABLET | Freq: Every day | ORAL | 1 refills | Status: DC

## 2018-06-19 NOTE — Telephone Encounter (Signed)
Can you let her know please

## 2018-06-19 NOTE — Telephone Encounter (Signed)
I'm going to crease the Seroquel XR to 300 mg nightly.  I will send that in to [facility]

## 2018-06-19 NOTE — Telephone Encounter (Signed)
Pt. Made aware. She will call if there are any complications.

## 2018-06-19 NOTE — Telephone Encounter (Signed)
Patient called and said that she is having a very hard time with her depression and would like a call back at [phone]

## 2018-07-07 DIAGNOSIS — R509 Fever, unspecified: Secondary | ICD-10-CM | POA: Diagnosis not present

## 2018-07-07 DIAGNOSIS — N9989 Other postprocedural complications and disorders of genitourinary system: Secondary | ICD-10-CM | POA: Diagnosis not present

## 2018-07-07 DIAGNOSIS — R5383 Other fatigue: Secondary | ICD-10-CM | POA: Diagnosis not present

## 2018-07-08 ENCOUNTER — Emergency Department (HOSPITAL_COMMUNITY): Payer: 59

## 2018-07-08 ENCOUNTER — Other Ambulatory Visit: Payer: Self-pay

## 2018-07-08 ENCOUNTER — Encounter (HOSPITAL_COMMUNITY): Payer: Self-pay | Admitting: Emergency Medicine

## 2018-07-08 ENCOUNTER — Emergency Department (HOSPITAL_COMMUNITY)
Admission: EM | Admit: 2018-07-08 | Discharge: 2018-07-08 | Disposition: A | Discharge status: Home / Self Care | Payer: 59 | Source: Home / Self Care | Attending: Emergency Medicine | Admitting: Emergency Medicine

## 2018-07-08 DIAGNOSIS — R509 Fever, unspecified: Secondary | ICD-10-CM

## 2018-07-08 DIAGNOSIS — R0602 Shortness of breath: Secondary | ICD-10-CM

## 2018-07-08 DIAGNOSIS — Z20822 Contact with and (suspected) exposure to covid-19: Secondary | ICD-10-CM

## 2018-07-08 DIAGNOSIS — Z03818 Encounter for observation for suspected exposure to other biological agents ruled out: Secondary | ICD-10-CM | POA: Insufficient documentation

## 2018-07-08 DIAGNOSIS — R300 Dysuria: Secondary | ICD-10-CM | POA: Diagnosis not present

## 2018-07-08 DIAGNOSIS — T43591A Poisoning by other antipsychotics and neuroleptics, accidental (unintentional), initial encounter: Secondary | ICD-10-CM | POA: Diagnosis not present

## 2018-07-08 DIAGNOSIS — E039 Hypothyroidism, unspecified: Secondary | ICD-10-CM | POA: Insufficient documentation

## 2018-07-08 DIAGNOSIS — Z87891 Personal history of nicotine dependence: Secondary | ICD-10-CM | POA: Insufficient documentation

## 2018-07-08 DIAGNOSIS — N12 Tubulo-interstitial nephritis, not specified as acute or chronic: Secondary | ICD-10-CM

## 2018-07-08 DIAGNOSIS — Z79899 Other long term (current) drug therapy: Secondary | ICD-10-CM | POA: Insufficient documentation

## 2018-07-08 LAB — COMPREHENSIVE METABOLIC PANEL
ALT: 16 U/L (ref 0–44)
AST: 15 U/L (ref 15–41)
Albumin: 3.5 g/dL (ref 3.5–5.0)
Alkaline Phosphatase: 95 U/L (ref 38–126)
Anion gap: 12 (ref 5–15)
BUN: 12 mg/dL (ref 6–20)
CO2: 22 mmol/L (ref 22–32)
Calcium: 9.2 mg/dL (ref 8.9–10.3)
Chloride: 103 mmol/L (ref 98–111)
Creatinine, Ser: 1.43 mg/dL — ABNORMAL HIGH (ref 0.44–1.00)
GFR calc Af Amer: 50 mL/min — ABNORMAL LOW (ref >60)
GFR calc non Af Amer: 44 mL/min — ABNORMAL LOW (ref >60)
Glucose, Bld: 130 mg/dL — ABNORMAL HIGH (ref 70–99)
Potassium: 3 mmol/L — ABNORMAL LOW (ref 3.5–5.1)
Sodium: 137 mmol/L (ref 135–145)
Total Bilirubin: 0.6 mg/dL (ref 0.3–1.2)
Total Protein: 6.9 g/dL (ref 6.5–8.1)

## 2018-07-08 LAB — URINALYSIS, ROUTINE W REFLEX MICROSCOPIC
Bilirubin Urine: NEGATIVE
Glucose, UA: NEGATIVE mg/dL
Ketones, ur: NEGATIVE mg/dL
Nitrite: NEGATIVE
Protein, ur: 100 mg/dL — AB
Specific Gravity, Urine: 1.009 (ref 1.005–1.030)
WBC, UA: >50 WBC/hpf — ABNORMAL HIGH (ref 0–5)
pH: 7 (ref 5.0–8.0)

## 2018-07-08 LAB — CBC WITH DIFFERENTIAL/PLATELET
Abs Immature Granulocytes: 0.34 10*3/uL — ABNORMAL HIGH (ref 0.00–0.07)
Basophils Absolute: 0.1 10*3/uL (ref 0.0–0.1)
Basophils Relative: 0 %
Eosinophils Absolute: 0 10*3/uL (ref 0.0–0.5)
Eosinophils Relative: 0 %
HCT: 33.4 % — ABNORMAL LOW (ref 36.0–46.0)
Hemoglobin: 10.5 g/dL — ABNORMAL LOW (ref 12.0–15.0)
Immature Granulocytes: 1 %
Lymphocytes Relative: 5 %
Lymphs Abs: 1.3 10*3/uL (ref 0.7–4.0)
MCH: 29.2 pg (ref 26.0–34.0)
MCHC: 31.4 g/dL (ref 30.0–36.0)
MCV: 92.8 fL (ref 80.0–100.0)
Monocytes Absolute: 1.9 10*3/uL — ABNORMAL HIGH (ref 0.1–1.0)
Monocytes Relative: 7 %
Neutro Abs: 22 10*3/uL — ABNORMAL HIGH (ref 1.7–7.7)
Neutrophils Relative %: 87 %
Platelets: 327 10*3/uL (ref 150–400)
RBC: 3.6 MIL/uL — ABNORMAL LOW (ref 3.87–5.11)
RDW: 13.6 % (ref 11.5–15.5)
WBC: 25.7 10*3/uL — ABNORMAL HIGH (ref 4.0–10.5)
nRBC: 0 % (ref 0.0–0.2)

## 2018-07-08 LAB — LIPASE, BLOOD: Lipase: 19 U/L (ref 11–51)

## 2018-07-08 LAB — LITHIUM LEVEL: Lithium Lvl: 1.11 mmol/L (ref 0.60–1.20)

## 2018-07-08 LAB — SARS CORONAVIRUS 2 BY RT PCR (HOSPITAL ORDER, PERFORMED IN ~~LOC~~ HOSPITAL LAB): SARS Coronavirus 2: NEGATIVE

## 2018-07-08 MED ORDER — ACETAMINOPHEN 500 MG PO TABS
1000.0000 mg | ORAL_TABLET | Freq: Once | ORAL | Status: AC
  Administered 2018-07-08: 1000 mg via ORAL
  Filled 2018-07-08: qty 2

## 2018-07-08 MED ORDER — HYDROCODONE-ACETAMINOPHEN 5-325 MG PO TABS: 1.0000 | ORAL_TABLET | ORAL | 0 refills | Status: DC | PRN

## 2018-07-08 MED ORDER — ONDANSETRON HCL 4 MG/2ML IJ SOLN
4.0000 mg | Freq: Once | INTRAMUSCULAR | Status: AC
  Administered 2018-07-08: 4 mg via INTRAVENOUS
  Filled 2018-07-08: qty 2

## 2018-07-08 MED ORDER — CEPHALEXIN 500 MG PO CAPS: 500.0000 mg | ORAL_CAPSULE | Freq: Four times a day (QID) | ORAL | 0 refills | Status: DC

## 2018-07-08 MED ORDER — SODIUM CHLORIDE 0.9 % IV BOLUS
1000.0000 mL | Freq: Once | INTRAVENOUS | Status: AC
  Administered 2018-07-08: 1000 mL via INTRAVENOUS

## 2018-07-08 MED ORDER — SODIUM CHLORIDE 0.9 % IV SOLN
1.0000 g | Freq: Once | INTRAVENOUS | Status: AC
  Administered 2018-07-08: 1 g via INTRAVENOUS
  Filled 2018-07-08: qty 10

## 2018-07-08 MED ORDER — IBUPROFEN 600 MG PO TABS: 600.0000 mg | ORAL_TABLET | Freq: Four times a day (QID) | ORAL | 0 refills | Status: DC | PRN

## 2018-07-08 MED ORDER — KETOROLAC TROMETHAMINE 30 MG/ML IJ SOLN
30.0000 mg | Freq: Once | INTRAMUSCULAR | Status: AC
  Administered 2018-07-08: 08:00:00 30 mg via INTRAVENOUS
  Filled 2018-07-08: qty 1

## 2018-07-08 MED ORDER — ONDANSETRON 4 MG PO TBDP: 4.0000 mg | ORAL_TABLET | Freq: Three times a day (TID) | ORAL | 0 refills | Status: DC | PRN

## 2018-07-08 NOTE — ED Notes (Signed)
ED Provider at bedside. 

## 2018-07-08 NOTE — ED Triage Notes (Signed)
Pt c/o of sob, cough and fever x 3 days.  Pt states " my husband was exposed to some people who were positive for the virus"

## 2018-07-08 NOTE — ED Provider Notes (Signed)
[facility] EMERGENCY DEPARTMENT Provider Note   CSN: [id number] Arrival date & time: 07/08/18  0102    History   Chief Complaint Chief Complaint  Patient presents with  . Shortness of Breath    HPI Kristen [name] is a 48 y.o. female.     Pt presents to the ED today with sob, cough, right side pain.  Pt said sx have been going on for 3 days.  The pt said she initially thought she had a kidney stone due to the flank pain.  She also mentioned that her husband works on a line at [employer].  One of his co-workers developed Covid-19 and died.  The pt did see her pcp yesterday for her sx and was tested for Covid.  She does not have those results.  She is in the ED today because she is feeling worse. She has not taken anything for her fever today.     Past Medical History:  Diagnosis Date  . Anxiety   . Arthritis   . Bipolar disorder ([place])   . Depression   . GERD (gastroesophageal reflux disease)    takes tums daily  . Hyperlipemia   . Mental disorder    bipolar  . PONV (postoperative nausea and vomiting)   . Stones in the urinary tract     Patient Active Problem List   Diagnosis Date Noted  . 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 GAD (generalized anxiety disorder) 12/05/2017  . Tremor 12/05/2017  . Insomnia 12/05/2017  . Bipolar I disorder ([place]) 12/05/2017  . Pseudoarthrosis of cervical spine ([place]) 08/18/2013  . Status post cervical spinal arthrodesis 08/18/2013  . Sleep disorder 07/20/2012  . Chronic rhinitis 07/20/2012    Past Surgical History:  Procedure Laterality Date  . ANTERIOR CERVICAL DECOMP/DISCECTOMY FUSION  02/17/2012   Procedure: ANTERIOR CERVICAL DECOMPRESSION/DISCECTOMY FUSION 2 LEVELS;  Surgeon: Hosie Spangle, MD;  Location: [facility];  Service: Neurosurgery;  Laterality: N/A;  Cervical four-five and Cervical six-seven anterior cervial decompression with fusion plating and bonegraft  . BREAST SURGERY  95   breast reduction   . CERVICAL  SPINE SURGERY  04  . CESAREAN SECTION     x2  . DIAGNOSTIC LAPAROSCOPY     staph infection   . ENDOMETRIAL ABLATION    . NASAL SINUS SURGERY    . POSTERIOR CERVICAL FUSION/FORAMINOTOMY N/A 08/18/2013   Procedure: CERVICAL FOUR TO CERVICAL SEVEN POSTERIOR CERVICAL FUSION/FORAMINOTOMY LEVEL 3;  Surgeon: Hosie Spangle, MD;  Location: [facility];  Service: Neurosurgery;  Laterality: N/A;  C4-7 posterior cervical fusion with lateral mass fixation  . TUBAL LIGATION       OB History   No obstetric history on file.      Home Medications    Prior to Admission medications   Medication Sig Start Date End Date Taking? Authorizing Provider  ALPRAZolam Duanne Moron) 1 MG tablet Take 1 tablet (1 mg total) by mouth 3 (three) times daily as needed. for anxiety 04/02/18  Yes [name] T, PA-C  atorvastatin (LIPITOR) 10 MG tablet Take 10 mg by mouth daily. 11/13/17  Yes [provider]  buPROPion (WELLBUTRIN XL) 150 MG 24 hr tablet TAKE 3 TABLETS BY MOUTH IN THE MORNING 02/26/18  Yes Hurst, Teresa T, PA-C  calcium carbonate (TUMS - DOSED IN MG ELEMENTAL CALCIUM) 500 MG chewable tablet Chew 2 tablets by mouth 2 (two) times daily with a meal.   Yes [provider]  cetirizine (ZYRTEC) 10  MG tablet Take 10 mg by mouth daily.   Yes [provider]  ibuprofen (ADVIL,MOTRIN) 200 MG tablet Take 800 mg by mouth every 6 (six) hours as needed for mild pain or moderate pain.   Yes [provider]  lamoTRIgine (LAMICTAL) 200 MG tablet Take 2.5 tablets (500 mg total) by mouth daily. 04/02/18  Yes Addison Lank, PA-C  levothyroxine (SYNTHROID) 75 MCG tablet Take 75 mcg by mouth daily. 05/27/18  Yes [provider]  lithium 300 MG tablet Take 5 tablets (1,500 mg total) by mouth at bedtime. 5 qhs 04/02/18  Yes Hurst, Teresa T, PA-C  modafinil (PROVIGIL) 200 MG tablet TAKE 1 TABLET BY MOUTH ONCE DAILY 02/26/18  Yes Hurst, Teresa T, PA-C  montelukast (SINGULAIR) 10 MG tablet Take  10 mg by mouth at bedtime.  07/08/17  Yes [provider]  pramipexole (MIRAPEX) 1 MG tablet Take 2 tablets (2 mg total) by mouth at bedtime. 02/26/18  Yes Hurst, Dorothea Glassman, PA-C  propranolol (INDERAL) 20 MG tablet Take 1 tablet (20 mg total) by mouth at bedtime. 06/03/18  Yes [name] T, PA-C  QUEtiapine (SEROQUEL XR) 300 MG 24 hr tablet Take 1 tablet (300 mg total) by mouth at bedtime. 06/19/18  Yes Hurst, Helene Kelp T, PA-C  cephALEXin (KEFLEX) 500 MG capsule Take 1 capsule (500 mg total) by mouth 4 (four) times daily. 07/08/18   Isla Pence, MD  HYDROcodone-acetaminophen (NORCO/VICODIN) 5-325 MG tablet Take 1 tablet by mouth every 4 (four) hours as needed. 07/08/18   Isla Pence, MD  ibuprofen (ADVIL) 600 MG tablet Take 1 tablet (600 mg total) by mouth every 6 (six) hours as needed. 07/08/18   Isla Pence, MD  ondansetron (ZOFRAN ODT) 4 MG disintegrating tablet Take 1 tablet (4 mg total) by mouth every 8 (eight) hours as needed. 07/08/18   Isla Pence, MD    Family History Family History  Problem Relation Age of Onset  . Asthma Mother   . Rheum arthritis Mother   . Non-Hodgkin's lymphoma Mother   . Allergies Other        entire family(mom and dad)    Social History Social History   Tobacco Use  . Smoking status: Former Smoker    Packs/day: 1.00    Years: 5.00    Pack years: 5.00    Last attempt to quit: 02/09/2002    Years since quitting: 16.4  . Smokeless tobacco: Never Used  Substance Use Topics  . Alcohol use: No  . Drug use: No     Allergies   Iohexol   Review of Systems Review of Systems  Constitutional: Positive for fever.  Respiratory: Positive for cough and shortness of breath.   Gastrointestinal: Positive for nausea.  Genitourinary: Positive for flank pain.  All other systems reviewed and are negative.    Physical Exam Updated Vital Signs BP (!) 92/43   Pulse 70   Temp (!) 100.8 F (38.2 C) (Oral)   Resp (!) 26   Ht 5\' 6"  (1.676  m)   Wt 99.8 kg   SpO2 93%   BMI 35.51 kg/m   Physical Exam Vitals signs and nursing note reviewed.  Constitutional:      Appearance: She is well-developed.  HENT:     Head: Normocephalic and atraumatic.     Mouth/Throat:     Mouth: Mucous membranes are moist.     Pharynx: Oropharynx is clear.  Eyes:     Extraocular Movements: Extraocular movements intact.  Pupils: Pupils are equal, round, and reactive to light.  Neck:     Musculoskeletal: Normal range of motion and neck supple.  Cardiovascular:     Rate and Rhythm: Normal rate and regular rhythm.  Pulmonary:     Effort: Pulmonary effort is normal.     Breath sounds: Normal breath sounds.  Abdominal:     General: Bowel sounds are normal.     Palpations: Abdomen is soft.  Musculoskeletal: Normal range of motion.  Skin:    General: Skin is warm and dry.     Capillary Refill: Capillary refill takes less than 2 seconds.  Neurological:     General: No focal deficit present.     Mental Status: She is alert and oriented to person, place, and time.  Psychiatric:        Mood and Affect: Mood normal.        Behavior: Behavior normal.      ED Treatments / Results  Labs (all labs ordered are listed, but only abnormal results are displayed) Labs Reviewed  COMPREHENSIVE METABOLIC PANEL - Abnormal; Notable for the following components:      Result Value   Potassium 3.0 (*)    Glucose, Bld 130 (*)    Creatinine, Ser 1.43 (*)    GFR calc non Af Amer 44 (*)    GFR calc Af Amer 50 (*)    All other components within normal limits  CBC WITH DIFFERENTIAL/PLATELET - Abnormal; Notable for the following components:   WBC 25.7 (*)    RBC 3.60 (*)    Hemoglobin 10.5 (*)    HCT 33.4 (*)    Neutro Abs 22.0 (*)    Monocytes Absolute 1.9 (*)    Abs Immature Granulocytes 0.34 (*)    All other components within normal limits  URINALYSIS, ROUTINE W REFLEX MICROSCOPIC - Abnormal; Notable for the following components:   APPearance  CLOUDY (*)    Hgb urine dipstick SMALL (*)    Protein, ur 100 (*)    Leukocytes,Ua MODERATE (*)    WBC, UA >50 (*)    Bacteria, UA RARE (*)    All other components within normal limits  SARS CORONAVIRUS 2 (HOSPITAL ORDER, [facility])  URINE CULTURE  LITHIUM LEVEL  LIPASE, BLOOD    EKG EKG Interpretation  Date/Time:  Wednesday Jul 08 2018 07:13:14 EDT Ventricular Rate:  87 PR Interval:    QRS Duration: 118 QT Interval:  467 QTC Calculation: 562 R Axis:   21 Text Interpretation:  Sinus rhythm Nonspecific intraventricular conduction delay Low voltage, precordial leads No significant change since last tracing Confirmed by Isla Pence [phone]) on 07/08/2018 7:41:58 AM   Radiology Dg Chest Port 1 View  Result Date: 07/08/2018 CLINICAL DATA:  Shortness of breath, cough, and fever. EXAM: PORTABLE CHEST 1 VIEW COMPARISON:  None. FINDINGS: Heart size is normal. Lung volumes are low. No focal airspace disease is present. IMPRESSION: No active disease. Electronically Signed   By: San Morelle M.D.   On: 07/08/2018 07:58    Procedures Procedures (including critical care time)  Medications Ordered in ED Medications  acetaminophen (TYLENOL) tablet 1,000 mg (1,000 mg Oral Given 07/08/18 0812)  sodium chloride 0.9 % bolus 1,000 mL (1,000 mLs Intravenous New Bag/Given 07/08/18 0814)  ketorolac (TORADOL) 30 MG/ML injection 30 mg (30 mg Intravenous Given 07/08/18 0812)  ondansetron (ZOFRAN) injection 4 mg (4 mg Intravenous Given 07/08/18 0812)  cefTRIAXone (ROCEPHIN) 1 g in sodium chloride  0.9 % 100 mL IVPB (0 g Intravenous Stopped 07/08/18 1007)     Initial Impression / Assessment and Plan / ED Course  I have reviewed the triage vital signs and the nursing notes.  Pertinent labs & imaging results that were available during my care of the patient were reviewed by me and considered in my medical decision making (see chart for details).   Pt feels much  better after IVFs.  She does have a uti with likely pyelo as she has flank pain and fever.  She is given a dose of rocephin and will go home with keflex.  Urine cultured.    Due to the exposure to covid, she had a covid test which was negative.  CXR negative.  Pt is stable for d/c.  Return if worse.     Amire DINESHA TWIGGS was evaluated in Emergency Department on 07/08/2018 for the symptoms described in the history of present illness. She was evaluated in the context of the global COVID-19 pandemic, which necessitated consideration that the patient might be at risk for infection with the SARS-CoV-2 virus that causes COVID-19. Institutional protocols and algorithms that pertain to the evaluation of patients at risk for COVID-19 are in a state of rapid change based on information released by regulatory bodies including the CDC and federal and state organizations. These policies and algorithms were followed during the patient's care in the ED.  Final Clinical Impressions(s) / ED Diagnoses   Final diagnoses:  Pyelonephritis  Covid-19 Virus not Detected  Fever, unspecified fever cause    ED Discharge Orders         Ordered    cephALEXin (KEFLEX) 500 MG capsule  4 times daily     07/08/18 0956    HYDROcodone-acetaminophen (NORCO/VICODIN) 5-325 MG tablet  Every 4 hours PRN     07/08/18 0956    ondansetron (ZOFRAN ODT) 4 MG disintegrating tablet  Every 8 hours PRN     07/08/18 0956    ibuprofen (ADVIL) 600 MG tablet  Every 6 hours PRN     07/08/18 0956           Isla Pence, MD 07/08/18 1008

## 2018-07-09 LAB — URINE CULTURE: Special Requests: NORMAL

## 2018-07-10 ENCOUNTER — Encounter (HOSPITAL_COMMUNITY): Payer: Self-pay | Admitting: Emergency Medicine

## 2018-07-10 ENCOUNTER — Emergency Department (HOSPITAL_COMMUNITY): Payer: 59

## 2018-07-10 ENCOUNTER — Inpatient Hospital Stay (HOSPITAL_COMMUNITY)
Admission: EM | Admit: 2018-07-10 | Discharge: 2018-07-12 | DRG: 917 | Disposition: A | Discharge status: Home / Self Care | Payer: 59 | Attending: Family Medicine | Admitting: Family Medicine

## 2018-07-10 ENCOUNTER — Other Ambulatory Visit: Payer: Self-pay

## 2018-07-10 DIAGNOSIS — G92 Toxic encephalopathy: Secondary | ICD-10-CM | POA: Diagnosis present

## 2018-07-10 DIAGNOSIS — E039 Hypothyroidism, unspecified: Secondary | ICD-10-CM | POA: Diagnosis present

## 2018-07-10 DIAGNOSIS — R748 Abnormal levels of other serum enzymes: Secondary | ICD-10-CM | POA: Diagnosis present

## 2018-07-10 DIAGNOSIS — R339 Retention of urine, unspecified: Secondary | ICD-10-CM | POA: Diagnosis present

## 2018-07-10 DIAGNOSIS — K2971 Gastritis, unspecified, with bleeding: Secondary | ICD-10-CM | POA: Diagnosis present

## 2018-07-10 DIAGNOSIS — Z20828 Contact with and (suspected) exposure to other viral communicable diseases: Secondary | ICD-10-CM | POA: Diagnosis present

## 2018-07-10 DIAGNOSIS — T56891A Toxic effect of other metals, accidental (unintentional), initial encounter: Secondary | ICD-10-CM

## 2018-07-10 DIAGNOSIS — Z8261 Family history of arthritis: Secondary | ICD-10-CM

## 2018-07-10 DIAGNOSIS — R631 Polydipsia: Secondary | ICD-10-CM | POA: Diagnosis present

## 2018-07-10 DIAGNOSIS — T43591A Poisoning by other antipsychotics and neuroleptics, accidental (unintentional), initial encounter: Principal | ICD-10-CM | POA: Diagnosis present

## 2018-07-10 DIAGNOSIS — Z87442 Personal history of urinary calculi: Secondary | ICD-10-CM

## 2018-07-10 DIAGNOSIS — F419 Anxiety disorder, unspecified: Secondary | ICD-10-CM | POA: Diagnosis present

## 2018-07-10 DIAGNOSIS — E785 Hyperlipidemia, unspecified: Secondary | ICD-10-CM | POA: Diagnosis present

## 2018-07-10 DIAGNOSIS — Z79891 Long term (current) use of opiate analgesic: Secondary | ICD-10-CM

## 2018-07-10 DIAGNOSIS — R300 Dysuria: Secondary | ICD-10-CM | POA: Diagnosis present

## 2018-07-10 DIAGNOSIS — G939 Disorder of brain, unspecified: Secondary | ICD-10-CM | POA: Diagnosis present

## 2018-07-10 DIAGNOSIS — K219 Gastro-esophageal reflux disease without esophagitis: Secondary | ICD-10-CM | POA: Diagnosis present

## 2018-07-10 DIAGNOSIS — Z800 Family history of malignant neoplasm of digestive organs: Secondary | ICD-10-CM | POA: Diagnosis not present

## 2018-07-10 DIAGNOSIS — Z79890 Hormone replacement therapy (postmenopausal): Secondary | ICD-10-CM

## 2018-07-10 DIAGNOSIS — R358 Other polyuria: Secondary | ICD-10-CM | POA: Diagnosis present

## 2018-07-10 DIAGNOSIS — E876 Hypokalemia: Secondary | ICD-10-CM | POA: Diagnosis present

## 2018-07-10 DIAGNOSIS — R0602 Shortness of breath: Secondary | ICD-10-CM | POA: Diagnosis present

## 2018-07-10 DIAGNOSIS — D62 Acute posthemorrhagic anemia: Secondary | ICD-10-CM | POA: Diagnosis present

## 2018-07-10 DIAGNOSIS — Z825 Family history of asthma and other chronic lower respiratory diseases: Secondary | ICD-10-CM

## 2018-07-10 DIAGNOSIS — N179 Acute kidney failure, unspecified: Secondary | ICD-10-CM | POA: Diagnosis present

## 2018-07-10 DIAGNOSIS — R109 Unspecified abdominal pain: Secondary | ICD-10-CM

## 2018-07-10 DIAGNOSIS — F411 Generalized anxiety disorder: Secondary | ICD-10-CM | POA: Diagnosis present

## 2018-07-10 DIAGNOSIS — S37009A Unspecified injury of unspecified kidney, initial encounter: Secondary | ICD-10-CM

## 2018-07-10 DIAGNOSIS — Z8719 Personal history of other diseases of the digestive system: Secondary | ICD-10-CM

## 2018-07-10 DIAGNOSIS — N136 Pyonephrosis: Secondary | ICD-10-CM | POA: Diagnosis present

## 2018-07-10 DIAGNOSIS — R338 Other retention of urine: Secondary | ICD-10-CM | POA: Diagnosis not present

## 2018-07-10 DIAGNOSIS — G9340 Encephalopathy, unspecified: Secondary | ICD-10-CM | POA: Diagnosis present

## 2018-07-10 DIAGNOSIS — D509 Iron deficiency anemia, unspecified: Secondary | ICD-10-CM | POA: Diagnosis not present

## 2018-07-10 DIAGNOSIS — R05 Cough: Secondary | ICD-10-CM | POA: Diagnosis present

## 2018-07-10 DIAGNOSIS — K5909 Other constipation: Secondary | ICD-10-CM | POA: Diagnosis present

## 2018-07-10 DIAGNOSIS — Z87891 Personal history of nicotine dependence: Secondary | ICD-10-CM

## 2018-07-10 DIAGNOSIS — F319 Bipolar disorder, unspecified: Secondary | ICD-10-CM | POA: Diagnosis present

## 2018-07-10 DIAGNOSIS — D649 Anemia, unspecified: Secondary | ICD-10-CM | POA: Diagnosis present

## 2018-07-10 DIAGNOSIS — R195 Other fecal abnormalities: Secondary | ICD-10-CM

## 2018-07-10 DIAGNOSIS — R4781 Slurred speech: Secondary | ICD-10-CM | POA: Diagnosis present

## 2018-07-10 DIAGNOSIS — Z791 Long term (current) use of non-steroidal anti-inflammatories (NSAID): Secondary | ICD-10-CM

## 2018-07-10 DIAGNOSIS — E860 Dehydration: Secondary | ICD-10-CM | POA: Diagnosis present

## 2018-07-10 DIAGNOSIS — Z807 Family history of other malignant neoplasms of lymphoid, hematopoietic and related tissues: Secondary | ICD-10-CM

## 2018-07-10 LAB — COMPREHENSIVE METABOLIC PANEL
ALT: 19 U/L (ref 0–44)
ALT: 19 U/L (ref 0–44)
AST: 21 U/L (ref 15–41)
AST: 22 U/L (ref 15–41)
Albumin: 3 g/dL — ABNORMAL LOW (ref 3.5–5.0)
Albumin: 2.9 g/dL — ABNORMAL LOW (ref 3.5–5.0)
Alkaline Phosphatase: 170 U/L — ABNORMAL HIGH (ref 38–126)
Alkaline Phosphatase: 177 U/L — ABNORMAL HIGH (ref 38–126)
Anion gap: 9 (ref 5–15)
Anion gap: 9 (ref 5–15)
BUN: 22 mg/dL — ABNORMAL HIGH (ref 6–20)
BUN: 16 mg/dL (ref 6–20)
CO2: 21 mmol/L — ABNORMAL LOW (ref 22–32)
CO2: 19 mmol/L — ABNORMAL LOW (ref 22–32)
Calcium: 8.7 mg/dL — ABNORMAL LOW (ref 8.9–10.3)
Calcium: 8.7 mg/dL — ABNORMAL LOW (ref 8.9–10.3)
Chloride: 107 mmol/L (ref 98–111)
Chloride: 114 mmol/L — ABNORMAL HIGH (ref 98–111)
Creatinine, Ser: 1.83 mg/dL — ABNORMAL HIGH (ref 0.44–1.00)
Creatinine, Ser: 1.56 mg/dL — ABNORMAL HIGH (ref 0.44–1.00)
GFR calc Af Amer: 37 mL/min — ABNORMAL LOW (ref >60)
GFR calc Af Amer: 45 mL/min — ABNORMAL LOW (ref >60)
GFR calc non Af Amer: 32 mL/min — ABNORMAL LOW (ref >60)
GFR calc non Af Amer: 39 mL/min — ABNORMAL LOW (ref >60)
Glucose, Bld: 82 mg/dL (ref 70–99)
Glucose, Bld: 124 mg/dL — ABNORMAL HIGH (ref 70–99)
Potassium: 3.1 mmol/L — ABNORMAL LOW (ref 3.5–5.1)
Potassium: 3.4 mmol/L — ABNORMAL LOW (ref 3.5–5.1)
Sodium: 137 mmol/L (ref 135–145)
Sodium: 142 mmol/L (ref 135–145)
Total Bilirubin: 0.5 mg/dL (ref 0.3–1.2)
Total Bilirubin: 0.5 mg/dL (ref 0.3–1.2)
Total Protein: 6.5 g/dL (ref 6.5–8.1)
Total Protein: 6.4 g/dL — ABNORMAL LOW (ref 6.5–8.1)

## 2018-07-10 LAB — URINALYSIS, ROUTINE W REFLEX MICROSCOPIC
Bilirubin Urine: NEGATIVE
Glucose, UA: NEGATIVE mg/dL
Ketones, ur: NEGATIVE mg/dL
Nitrite: NEGATIVE
Specific Gravity, Urine: <1.005 — ABNORMAL LOW (ref 1.005–1.030)
pH: 6.5 (ref 5.0–8.0)

## 2018-07-10 LAB — CBC WITH DIFFERENTIAL/PLATELET
Abs Immature Granulocytes: 0.46 10*3/uL — ABNORMAL HIGH (ref 0.00–0.07)
Basophils Absolute: 0.1 10*3/uL (ref 0.0–0.1)
Basophils Relative: 0 %
Eosinophils Absolute: 0.7 10*3/uL — ABNORMAL HIGH (ref 0.0–0.5)
Eosinophils Relative: 3 %
HCT: 26.6 % — ABNORMAL LOW (ref 36.0–46.0)
Hemoglobin: 8.4 g/dL — ABNORMAL LOW (ref 12.0–15.0)
Immature Granulocytes: 2 %
Lymphocytes Relative: 6 %
Lymphs Abs: 1.3 10*3/uL (ref 0.7–4.0)
MCH: 29.2 pg (ref 26.0–34.0)
MCHC: 31.6 g/dL (ref 30.0–36.0)
MCV: 92.4 fL (ref 80.0–100.0)
Monocytes Absolute: 1.4 10*3/uL — ABNORMAL HIGH (ref 0.1–1.0)
Monocytes Relative: 7 %
Neutro Abs: 17 10*3/uL — ABNORMAL HIGH (ref 1.7–7.7)
Neutrophils Relative %: 82 %
Platelets: 308 10*3/uL (ref 150–400)
RBC: 2.88 MIL/uL — ABNORMAL LOW (ref 3.87–5.11)
RDW: 14.2 % (ref 11.5–15.5)
WBC: 20.9 10*3/uL — ABNORMAL HIGH (ref 4.0–10.5)
nRBC: 0 % (ref 0.0–0.2)

## 2018-07-10 LAB — RAPID URINE DRUG SCREEN, HOSP PERFORMED
Amphetamines: NOT DETECTED
Barbiturates: NOT DETECTED
Benzodiazepines: POSITIVE — AB
Cocaine: NOT DETECTED
Opiates: POSITIVE — AB
Tetrahydrocannabinol: NOT DETECTED

## 2018-07-10 LAB — CBC
HCT: 27.1 % — ABNORMAL LOW (ref 36.0–46.0)
Hemoglobin: 8.3 g/dL — ABNORMAL LOW (ref 12.0–15.0)
MCH: 29 pg (ref 26.0–34.0)
MCHC: 30.6 g/dL (ref 30.0–36.0)
MCV: 94.8 fL (ref 80.0–100.0)
Platelets: 343 10*3/uL (ref 150–400)
RBC: 2.86 MIL/uL — ABNORMAL LOW (ref 3.87–5.11)
RDW: 14.5 % (ref 11.5–15.5)
WBC: 20.1 10*3/uL — ABNORMAL HIGH (ref 4.0–10.5)
nRBC: 0 % (ref 0.0–0.2)

## 2018-07-10 LAB — SARS CORONAVIRUS 2 BY RT PCR (HOSPITAL ORDER, PERFORMED IN ~~LOC~~ HOSPITAL LAB): SARS Coronavirus 2: NEGATIVE

## 2018-07-10 LAB — URINALYSIS, MICROSCOPIC (REFLEX): WBC, UA: >50 WBC/hpf (ref 0–5)

## 2018-07-10 LAB — LITHIUM LEVEL
Lithium Lvl: 1.74 mmol/L (ref 0.60–1.20)
Lithium Lvl: 1.44 mmol/L — ABNORMAL HIGH (ref 0.60–1.20)
Lithium Lvl: 1.13 mmol/L (ref 0.60–1.20)

## 2018-07-10 LAB — SALICYLATE LEVEL: Salicylate Lvl: <7 mg/dL (ref 2.8–30.0)

## 2018-07-10 LAB — LACTIC ACID, PLASMA
Lactic Acid, Venous: 0.9 mmol/L (ref 0.5–1.9)
Lactic Acid, Venous: 0.7 mmol/L (ref 0.5–1.9)

## 2018-07-10 LAB — IRON AND TIBC
Iron: 14 ug/dL — ABNORMAL LOW (ref 28–170)
Saturation Ratios: 6 % — ABNORMAL LOW (ref 10.4–31.8)
TIBC: 252 ug/dL (ref 250–450)
UIBC: 238 ug/dL

## 2018-07-10 LAB — ACETAMINOPHEN LEVEL: Acetaminophen (Tylenol), Serum: <10 ug/mL — ABNORMAL LOW (ref 10–30)

## 2018-07-10 LAB — FERRITIN: Ferritin: 289 ng/mL (ref 11–307)

## 2018-07-10 LAB — TYPE AND SCREEN
ABO/RH(D): O POS
Antibody Screen: NEGATIVE

## 2018-07-10 LAB — VITAMIN B12: Vitamin B-12: 301 pg/mL (ref 180–914)

## 2018-07-10 LAB — MAGNESIUM: Magnesium: 2.5 mg/dL — ABNORMAL HIGH (ref 1.7–2.4)

## 2018-07-10 LAB — RETICULOCYTES
Immature Retic Fract: 6.4 % (ref 2.3–15.9)
RBC.: 2.88 MIL/uL — ABNORMAL LOW (ref 3.87–5.11)
Retic Count, Absolute: 20.2 10*3/uL (ref 19.0–186.0)
Retic Ct Pct: 0.7 % (ref 0.4–3.1)

## 2018-07-10 LAB — FOLATE: Folate: 8.9 ng/mL (ref >5.9)

## 2018-07-10 MED ORDER — CALCIUM CARBONATE ANTACID 500 MG PO CHEW
2.0000 | CHEWABLE_TABLET | Freq: Two times a day (BID) | ORAL | Status: DC
  Administered 2018-07-10 – 2018-07-11 (×3): 400 mg via ORAL
  Filled 2018-07-10 (×3): qty 2

## 2018-07-10 MED ORDER — SODIUM CHLORIDE 0.9 % IV SOLN
1.0000 g | INTRAVENOUS | Status: DC
  Administered 2018-07-10 – 2018-07-11 (×2): 1 g via INTRAVENOUS
  Filled 2018-07-10 (×2): qty 10

## 2018-07-10 MED ORDER — MONTELUKAST SODIUM 10 MG PO TABS
10.0000 mg | ORAL_TABLET | Freq: Every day | ORAL | Status: DC
  Administered 2018-07-10 – 2018-07-11 (×2): 10 mg via ORAL
  Filled 2018-07-10 (×2): qty 1

## 2018-07-10 MED ORDER — ATORVASTATIN CALCIUM 10 MG PO TABS
10.0000 mg | ORAL_TABLET | Freq: Every day | ORAL | Status: DC
  Administered 2018-07-10 – 2018-07-11 (×2): 10 mg via ORAL
  Filled 2018-07-10 (×2): qty 1

## 2018-07-10 MED ORDER — SODIUM CHLORIDE 0.9 % IV SOLN: 80.0000 mg | Freq: Two times a day (BID) | INTRAVENOUS | Status: DC

## 2018-07-10 MED ORDER — HYDROCODONE-ACETAMINOPHEN 5-325 MG PO TABS
1.0000 | ORAL_TABLET | Freq: Once | ORAL | Status: AC
  Administered 2018-07-10: 1 via ORAL
  Filled 2018-07-10: qty 1

## 2018-07-10 MED ORDER — PANTOPRAZOLE SODIUM 40 MG IV SOLR: 40.0000 mg | Freq: Two times a day (BID) | INTRAVENOUS | Status: DC

## 2018-07-10 MED ORDER — SODIUM CHLORIDE 0.9 % IV SOLN
INTRAVENOUS | Status: DC
  Administered 2018-07-10 – 2018-07-11 (×2): via INTRAVENOUS

## 2018-07-10 MED ORDER — ACETAMINOPHEN 650 MG RE SUPP: 650.0000 mg | Freq: Four times a day (QID) | RECTAL | Status: DC | PRN

## 2018-07-10 MED ORDER — SODIUM CHLORIDE 0.9 % IV BOLUS
30.0000 mL/kg | Freq: Once | INTRAVENOUS | Status: AC
  Administered 2018-07-10: 3000 mL via INTRAVENOUS

## 2018-07-10 MED ORDER — LORATADINE 10 MG PO TABS
10.0000 mg | ORAL_TABLET | Freq: Every day | ORAL | Status: DC
  Administered 2018-07-11: 10 mg via ORAL
  Filled 2018-07-10: qty 1

## 2018-07-10 MED ORDER — SODIUM CHLORIDE 0.9 % IV SOLN
1.0000 g | Freq: Once | INTRAVENOUS | Status: AC
  Administered 2018-07-10: 1 g via INTRAVENOUS
  Filled 2018-07-10: qty 10

## 2018-07-10 MED ORDER — SODIUM CHLORIDE 0.9 % IV SOLN
80.0000 mg | Freq: Once | INTRAVENOUS | Status: AC
  Administered 2018-07-10: 80 mg via INTRAVENOUS
  Filled 2018-07-10: qty 80

## 2018-07-10 MED ORDER — SODIUM CHLORIDE 0.9 % IV SOLN
8.0000 mg/h | INTRAVENOUS | Status: DC
  Administered 2018-07-10 – 2018-07-11 (×2): 8 mg/h via INTRAVENOUS
  Filled 2018-07-10 (×7): qty 80

## 2018-07-10 MED ORDER — ACETAMINOPHEN 325 MG PO TABS
650.0000 mg | ORAL_TABLET | Freq: Four times a day (QID) | ORAL | Status: DC | PRN
  Administered 2018-07-10 – 2018-07-11 (×5): 650 mg via ORAL
  Filled 2018-07-10 (×5): qty 2

## 2018-07-10 MED ORDER — POTASSIUM CHLORIDE CRYS ER 20 MEQ PO TBCR
40.0000 meq | EXTENDED_RELEASE_TABLET | Freq: Once | ORAL | Status: AC
  Administered 2018-07-10: 40 meq via ORAL
  Filled 2018-07-10: qty 2

## 2018-07-10 MED ORDER — SODIUM CHLORIDE 0.9 % IV SOLN
INTRAVENOUS | Status: DC
  Administered 2018-07-10: 13:00:00 via INTRAVENOUS

## 2018-07-10 MED ORDER — LEVOTHYROXINE SODIUM 75 MCG PO TABS
75.0000 ug | ORAL_TABLET | Freq: Every day | ORAL | Status: DC
  Administered 2018-07-11 – 2018-07-12 (×2): 75 ug via ORAL
  Filled 2018-07-10 (×2): qty 1

## 2018-07-10 NOTE — ED Notes (Signed)
hospitalist at bedside

## 2018-07-10 NOTE — ED Notes (Signed)
Patient is attempting to provide urine specimen by bedpan.  Patient requested to attempt bedpan before we did in/out cath.

## 2018-07-10 NOTE — ED Notes (Signed)
Awaiting CT

## 2018-07-10 NOTE — ED Triage Notes (Signed)
Seen here yesterday   Followed up [facility]  Sent here to RO sepsis  Pt reports this am she could not speak, walk was seeing things on the wall

## 2018-07-10 NOTE — ED Notes (Signed)
At nurse request, patient given ice chips.

## 2018-07-10 NOTE — ED Notes (Signed)
Date and time results received: 07/10/18 1324 (use smartphrase ".now" to insert current time)  Test: Lithium  Critical Value: 1.74  Name of Provider Notified: MD Thurnell Garbe  Orders Received? Or Actions Taken?: No new orders obtained

## 2018-07-10 NOTE — ED Notes (Signed)
Pt has previously reported that her father died last week, that his remembrance service is tomorrow- and that while she is the only child of her mother and father, she has a helf sibling that will be at the service   She has tried to get out of bed and is difficult to redirect- She has a therapist locally She also denies taking any more meds than directed   She asks if she is going to die. She is assurred that she is not dying and we are caring for her

## 2018-07-10 NOTE — ED Notes (Signed)
report to Tilden Fossa, RN

## 2018-07-10 NOTE — ED Provider Notes (Signed)
[facility] EMERGENCY DEPARTMENT Provider Note   CSN: [id number] Arrival date & time: 07/10/18  1151    History   Chief Complaint Chief Complaint  Patient presents with  . Dysuria    HPI [name] is a 48 y.o. female.     HPI   Pt is a 48 y/o female with a h/o anxiety, bipolar disorder, depression, GERD, hyperlipidemia, kidney stones, who presents to the ED today due to concern for sepsis. patient evaluated in the ED on 07/08/2018 with c/o cough, sob, and right side pain. Also reported possible COVID exposure. She ultimately was diagnosed with pyelonephritis and d/c with abx.  She had a negative COVID test at that time. Today she f/u at 39 and apparently was unable to speak or walk and was sent here due to concern for sepsis.   On my eval, pt states that this morning she woke up with slurred speech and a feeling of right arm weakness. Denies numbness or RLE weakness/numbness. Denies head trauma or LOC. She is c/o of a headache as well. No blood thinner use. No vision changes.   She is also c/o SOB and dry cough. No CP. No abd pain. Had an episode of vomiting yesterday, none today. Has felt nauseated. Reports intermittent dysuria. No frequency. No abnormal BMs.   Past Medical History:  Diagnosis Date  . Anxiety   . Arthritis   . Bipolar disorder ([place])   . Depression   . GERD (gastroesophageal reflux disease)    takes tums daily  . Hyperlipemia   . Mental disorder    bipolar  . PONV (postoperative nausea and vomiting)   . Stones in the urinary tract     Patient Active Problem List   Diagnosis Date Noted  . AKI (acute kidney injury) ([place]) 07/10/2018  . Lithium toxicity 07/10/2018  . Encephalopathy 07/10/2018  . Acute urinary retention 07/10/2018  . 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 GAD (generalized anxiety disorder) 12/05/2017  . Tremor 12/05/2017  . Insomnia 12/05/2017  . Bipolar I disorder ([place]) 12/05/2017  . Pseudoarthrosis of cervical  spine ([place]) 08/18/2013  . Status post cervical spinal arthrodesis 08/18/2013  . Sleep disorder 07/20/2012  . Chronic rhinitis 07/20/2012    Past Surgical History:  Procedure Laterality Date  . ANTERIOR CERVICAL DECOMP/DISCECTOMY FUSION  02/17/2012   Procedure: ANTERIOR CERVICAL DECOMPRESSION/DISCECTOMY FUSION 2 LEVELS;  Surgeon: Hosie Spangle, MD;  Location: [place] [facility];  Service: Neurosurgery;  Laterality: N/A;  Cervical four-five and Cervical six-seven anterior cervial decompression with fusion plating and bonegraft  . BREAST SURGERY  95   breast reduction   . CERVICAL SPINE SURGERY  04  . CESAREAN SECTION     x2  . DIAGNOSTIC LAPAROSCOPY     staph infection   . ENDOMETRIAL ABLATION    . NASAL SINUS SURGERY    . POSTERIOR CERVICAL FUSION/FORAMINOTOMY N/A 08/18/2013   Procedure: CERVICAL FOUR TO CERVICAL SEVEN POSTERIOR CERVICAL FUSION/FORAMINOTOMY LEVEL 3;  Surgeon: Hosie Spangle, MD;  Location: [place] [facility];  Service: Neurosurgery;  Laterality: N/A;  C4-7 posterior cervical fusion with lateral mass fixation  . TUBAL LIGATION       OB History   No obstetric history on file.      Home Medications    Prior to Admission medications   Medication Sig Start Date End Date Taking? Authorizing Provider  ALPRAZolam Duanne Moron) 1 MG tablet Take 1 tablet (1 mg total) by mouth 3 (three) times daily as  needed. for anxiety 04/02/18  Yes [name] T, PA-C  atorvastatin (LIPITOR) 10 MG tablet Take 10 mg by mouth daily. 11/13/17  Yes [provider]  buPROPion (WELLBUTRIN XL) 150 MG 24 hr tablet TAKE 3 TABLETS BY MOUTH IN THE MORNING 02/26/18  Yes Hurst, Teresa T, PA-C  calcium carbonate (TUMS - DOSED IN MG ELEMENTAL CALCIUM) 500 MG chewable tablet Chew 2 tablets by mouth 2 (two) times daily with a meal.   Yes [provider]  cephALEXin (KEFLEX) 500 MG capsule Take 1 capsule (500 mg total) by mouth 4 (four) times daily. 07/08/18  Yes Isla Pence, MD  cetirizine (ZYRTEC)  10 MG tablet Take 10 mg by mouth daily.   Yes [provider]  HYDROcodone-acetaminophen (NORCO/VICODIN) 5-325 MG tablet Take 1 tablet by mouth every 4 (four) hours as needed. 07/08/18  Yes Isla Pence, MD  ibuprofen (ADVIL) 600 MG tablet Take 1 tablet (600 mg total) by mouth every 6 (six) hours as needed. 07/08/18  Yes Isla Pence, MD  ibuprofen (ADVIL,MOTRIN) 200 MG tablet Take 800 mg by mouth every 6 (six) hours as needed for mild pain or moderate pain.   Yes [provider]  lamoTRIgine (LAMICTAL) 200 MG tablet Take 2.5 tablets (500 mg total) by mouth daily. 04/02/18  Yes Addison Lank, PA-C  levothyroxine (SYNTHROID) 75 MCG tablet Take 75 mcg by mouth daily. 05/27/18  Yes [provider]  lithium 300 MG tablet Take 5 tablets (1,500 mg total) by mouth at bedtime. 5 qhs 04/02/18  Yes Hurst, Teresa T, PA-C  modafinil (PROVIGIL) 200 MG tablet TAKE 1 TABLET BY MOUTH ONCE DAILY 02/26/18  Yes Hurst, Teresa T, PA-C  montelukast (SINGULAIR) 10 MG tablet Take 10 mg by mouth at bedtime.  07/08/17  Yes [provider]  ondansetron (ZOFRAN ODT) 4 MG disintegrating tablet Take 1 tablet (4 mg total) by mouth every 8 (eight) hours as needed. 07/08/18  Yes Isla Pence, MD  pramipexole (MIRAPEX) 1 MG tablet Take 2 tablets (2 mg total) by mouth at bedtime. 02/26/18  Yes Hurst, Dorothea Glassman, PA-C  propranolol (INDERAL) 20 MG tablet Take 1 tablet (20 mg total) by mouth at bedtime. 06/03/18  Yes [name] T, PA-C  QUEtiapine (SEROQUEL XR) 300 MG 24 hr tablet Take 1 tablet (300 mg total) by mouth at bedtime. 06/19/18   Addison Lank, PA-C    Family History Family History  Problem Relation Age of Onset  . Asthma Mother   . Rheum arthritis Mother   . Non-Hodgkin's lymphoma Mother   . Allergies Other        entire family(mom and dad)    Social History Social History   Tobacco Use  . Smoking status: Former Smoker    Packs/day: 1.00    Years: 5.00    Pack years: 5.00     Last attempt to quit: 02/09/2002    Years since quitting: 16.4  . Smokeless tobacco: Never Used  Substance Use Topics  . Alcohol use: No  . Drug use: No     Allergies   Iohexol   Review of Systems Review of Systems  Constitutional: Positive for fever.  HENT: Negative for ear pain and sore throat.   Eyes: Negative for visual disturbance.  Respiratory: Positive for cough and shortness of breath.   Cardiovascular: Positive for leg swelling (feet). Negative for chest pain and palpitations.  Gastrointestinal: Positive for nausea and vomiting (resolved). Negative for abdominal pain, constipation and diarrhea.  Genitourinary:  Positive for dysuria. Negative for hematuria.  Musculoskeletal: Negative for arthralgias and back pain.  Skin: Negative for color change and rash.  Neurological: Positive for speech difficulty, weakness and headaches. Negative for numbness.  All other systems reviewed and are negative.    Physical Exam Updated Vital Signs BP (!) 118/56   Pulse 71   Temp 98.4 F (36.9 C) (Oral)   Resp (!) 24   Ht '5\' 6"'  (1.676 m)   Wt 99.7 kg   SpO2 90%   BMI 35.48 kg/m   Physical Exam Vitals signs and nursing note reviewed.  Constitutional:      General: She is not in acute distress.    Appearance: She is well-developed. She is ill-appearing.  HENT:     Head: Normocephalic.     Mouth/Throat:     Mouth: Mucous membranes are dry.  Eyes:     Extraocular Movements: Extraocular movements intact.     Conjunctiva/sclera: Conjunctivae normal.     Pupils: Pupils are equal, round, and reactive to light.  Neck:     Musculoskeletal: Neck supple.  Cardiovascular:     Rate and Rhythm: Normal rate and regular rhythm.     Heart sounds: Normal heart sounds. No murmur.  Pulmonary:     Effort: Pulmonary effort is normal. No respiratory distress.     Breath sounds: Normal breath sounds. No wheezing, rhonchi or rales.     Comments: Speaking in full sentences.  Abdominal:      General: Bowel sounds are normal. There is no distension.     Palpations: Abdomen is soft.     Tenderness: There is no abdominal tenderness. There is no guarding or rebound.  Genitourinary:    Comments: Chaperone present. No gross blood on DRE. Musculoskeletal:        General: No tenderness.     Right lower leg: No edema.     Left lower leg: No edema.  Skin:    General: Skin is warm and dry.  Neurological:     Mental Status: She is alert.     Comments: Mental Status:  Alert, appears somewhat confused, able to answer questions appropriately, speech is somewhat slurred, no aphasia noted. Able to follow 2 step commands without difficulty.  Cranial Nerves:  II:   pupils equal, round, reactive to light III,IV, VI: right eye ptosis, extra-ocular motions intact bilaterally  V,VII: smile is grossly symmetric, facial light touch sensation equal VIII: hearing grossly normal to voice  X: uvula elevates symmetrically  XI: bilateral shoulder shrug symmetric and strong XII: midline tongue extension without fassiculations Motor:  Normal tone. 5/5 strength of BUE and BLE major muscle groups including strong and equal grip strength and dorsiflexion/plantar flexion Sensory: light touch normal in all extremities. Cerebellar: normal finger-to-nose with bilateral upper extremities Gait: not assessed CV: 2+ radial and DP pulses Negative pronator drift       ED Treatments / Results  Labs (all labs ordered are listed, but only abnormal results are displayed) Labs Reviewed  COMPREHENSIVE METABOLIC PANEL - Abnormal; Notable for the following components:      Result Value   Potassium 3.1 (*)    CO2 21 (*)    BUN 22 (*)    Creatinine, Ser 1.83 (*)    Calcium 8.7 (*)    Albumin 3.0 (*)    Alkaline Phosphatase 170 (*)    GFR calc non Af Amer 32 (*)    GFR calc Af Amer 37 (*)    All  other components within normal limits  CBC WITH DIFFERENTIAL/PLATELET - Abnormal; Notable for the following  components:   WBC 20.9 (*)    RBC 2.88 (*)    Hemoglobin 8.4 (*)    HCT 26.6 (*)    Neutro Abs 17.0 (*)    Monocytes Absolute 1.4 (*)    Eosinophils Absolute 0.7 (*)    Abs Immature Granulocytes 0.46 (*)    All other components within normal limits  URINALYSIS, ROUTINE W REFLEX MICROSCOPIC - Abnormal; Notable for the following components:   Specific Gravity, Urine <1.005 (*)    Hgb urine dipstick SMALL (*)    Protein, ur TRACE (*)    Leukocytes,Ua LARGE (*)    All other components within normal limits  RAPID URINE DRUG SCREEN, HOSP PERFORMED - Abnormal; Notable for the following components:   Opiates POSITIVE (*)    Benzodiazepines POSITIVE (*)    All other components within normal limits  LITHIUM LEVEL - Abnormal; Notable for the following components:   Lithium Lvl 1.74 (*)    All other components within normal limits  ACETAMINOPHEN LEVEL - Abnormal; Notable for the following components:   Acetaminophen (Tylenol), Serum <10 (*)    All other components within normal limits  URINALYSIS, MICROSCOPIC (REFLEX) - Abnormal; Notable for the following components:   Bacteria, UA MANY (*)    All other components within normal limits  LITHIUM LEVEL - Abnormal; Notable for the following components:   Lithium Lvl 1.44 (*)    All other components within normal limits  CULTURE, BLOOD (ROUTINE X 2)  CULTURE, BLOOD (ROUTINE X 2)  SARS CORONAVIRUS 2 (HOSPITAL ORDER, [place] [facility])  URINE CULTURE  LACTIC ACID, PLASMA  LACTIC ACID, PLASMA  SALICYLATE LEVEL  OCCULT BLOOD X 1 CARD TO LAB, STOOL  VITAMIN B12  FOLATE  IRON AND TIBC  FERRITIN  RETICULOCYTES  TYPE AND SCREEN    EKG EKG Interpretation  Date/Time:  Friday Jul 10 2018 12:15:20 EDT Ventricular Rate:  68 PR Interval:    QRS Duration: 125 QT Interval:  446 QTC Calculation: 475 R Axis:   59 Text Interpretation:  Sinus rhythm Nonspecific intraventricular conduction delay Borderline T abnormalities,  anterior leads Baseline wander Artifact Poor data quality When compared with ECG of 07/08/2018 Poor data quality in current ECG precludes serial comparison no obvious acute changes from previous EKG Confirmed by Francine Graven [phone]) on 07/10/2018 12:31:46 PM   Radiology Ct Head Wo Contrast  Result Date: 07/10/2018 CLINICAL DATA:  Altered mental status. Right-sided weakness and slurred speech. EXAM: CT HEAD WITHOUT CONTRAST TECHNIQUE: Contiguous axial images were obtained from the base of the skull through the vertex without intravenous contrast. COMPARISON:  None. FINDINGS: Brain: There is no evidence of acute infarct, intracranial hemorrhage, mass, midline shift, or extra-axial fluid collection. The ventricles and sulci are normal. Vascular: No hyperdense vessel. Skull: No fracture or focal osseous lesion. Sinuses/Orbits: Small right maxillary sinus. No evidence of acute sinus inflammation. Unremarkable orbits. Other: None. IMPRESSION: Unremarkable CT appearance of the brain. Electronically Signed   By: Logan Bores M.D.   On: 07/10/2018 14:57   Dg Chest Port 1 View  Result Date: 07/10/2018 CLINICAL DATA:  Confusion with hallucinations EXAM: PORTABLE CHEST 1 VIEW COMPARISON:  Jul 08, 2018 FINDINGS: There is no appreciable edema or consolidation. Heart is mildly enlarged with pulmonary vascularity normal. No adenopathy. There is postoperative change in the lower cervical region. IMPRESSION: Mild cardiomegaly.  No edema or consolidation.  Electronically Signed   By: Lowella Grip III M.D.   On: 07/10/2018 12:31   Ct Renal Stone Study  Result Date: 07/10/2018 CLINICAL DATA:  Back/flank pain. EXAM: CT ABDOMEN AND PELVIS WITHOUT CONTRAST TECHNIQUE: Multidetector CT imaging of the abdomen and pelvis was performed following the standard protocol without IV contrast. COMPARISON:  08/05/2011 FINDINGS: Lower chest: Motion artifact in the lung bases with interlobular septal thickening and ground-glass  opacity bilaterally. Mild bibasilar atelectasis. No pleural effusion. Hepatobiliary: No focal liver abnormality is seen. No gallstones, gallbladder wall thickening, or biliary dilatation. Pancreas: Unremarkable. Spleen: Unremarkable. Adrenals/Urinary Tract: Unremarkable adrenal glands. New 11 mm calculus in the interpolar left kidney. Punctate right renal calculi on the prior CT are no longer visualized. Mild bilateral hydroureteronephrosis, left greater than right, with bilateral perinephric and periureteral stranding. No ureteral calculi. Moderately distended bladder. Stomach/Bowel: The stomach is unremarkable. There is no evidence of bowel obstruction. Mild left-sided colonic diverticulosis is noted without evidence of acute diverticulitis. The appendix is unremarkable. Vascular/Lymphatic: Normal caliber of the abdominal aorta. No enlarged lymph nodes. Reproductive: Chronic nodular appearance of the uterus suggesting underlying fibroids. No adnexal mass. Other: No ascites or pneumoperitoneum. Musculoskeletal: Chronic advanced lower lumbar disc degeneration involving the last fully formed disc space. IMPRESSION: 1. Mild bilateral hydroureteronephrosis and perinephric stranding with moderate bladder distension and no obstructing stone identified. Correlate for signs of upper tract infection. 2. New 11 mm left renal calculus. 3. Ground-glass opacity and interlobular septal thickening in the lung bases suggesting edema. Electronically Signed   By: Logan Bores M.D.   On: 07/10/2018 15:07    Procedures Procedures (including critical care time) CRITICAL CARE Performed by: Rodney Booze   Total critical care time: 61 minutes  Critical care time was exclusive of separately billable procedures and treating other patients.  Critical care was necessary to treat or prevent imminent or life-threatening deterioration.  Critical care was time spent personally by me on the following activities: development of  treatment plan with patient and/or surrogate as well as nursing, discussions with consultants, evaluation of patient's response to treatment, examination of patient, obtaining history from patient or surrogate, ordering and performing treatments and interventions, ordering and review of laboratory studies, ordering and review of radiographic studies, pulse oximetry and re-evaluation of patient's condition.   Medications Ordered in ED Medications  0.9 %  sodium chloride infusion ( Intravenous Rate/Dose Change 07/10/18 1642)  cefTRIAXone (ROCEPHIN) 1 g in sodium chloride 0.9 % 100 mL IVPB (has no administration in time range)  pantoprazole (PROTONIX) 80 mg in sodium chloride 0.9 % 100 mL IVPB (has no administration in time range)  pantoprazole (PROTONIX) 80 mg in sodium chloride 0.9 % 250 mL (0.32 mg/mL) infusion (has no administration in time range)  pantoprazole (PROTONIX) injection 40 mg (has no administration in time range)  cefTRIAXone (ROCEPHIN) 1 g in sodium chloride 0.9 % 100 mL IVPB (0 g Intravenous Stopped 07/10/18 1316)  sodium chloride 0.9 % bolus 2,991 mL (0 mLs Intravenous Stopped 07/10/18 1429)  potassium chloride SA (K-DUR) CR tablet 40 mEq (40 mEq Oral Given 07/10/18 1420)     Initial Impression / Assessment and Plan / ED Course  I have reviewed the triage vital signs and the nursing notes.  Pertinent labs & imaging results that were available during my care of the patient were reviewed by me and considered in my medical decision making (see chart for details).   Final Clinical Impressions(s) / ED Diagnoses   Final diagnoses:  Lithium toxicity, accidental or unintentional, initial encounter  AKI (acute kidney injury) ([place])  Anemia, unspecified type  Hematest positive stools   Pt recently seen in ED 2 days ago and dx with pyelonephritis. F/u in clinic today and sent here due to concern for possible sepsis. Pt reporting slurred speech and right arm weakness onset when she woke  up this AM. VAN negative, therefore code stroke not activated. She also c/o SOB/cough. No abd pain. +NV. Intermittent dysuria without frequency.  Sepsis workup initiated by Dr. Thurnell Garbe. IVF and ceftriaxone ordered with pyelonephritis suspected as source.   CBC without leukocytosis, anemia present worsened from 2 days ago.      -- F/u with hemoccult which was positive. No gross melena CMP with mild hypokalemia at 3.1, slightly low bicarb, elevated BUN/Cr at 22 and 1.83. Liver enzymes WNL. Alk phos elevated.      -- Suspect electrolyte derangement and worsening renal fxn likely secondary to decreased PO intake and dehydration from NV.  Lactic acid negative Lithium level elevated at 1.74 Blood cultures obtained and pending.  UA with large leukocytes, 6-10 RBCs, >50WBCs, and many bacteria. Prior urine culture reviewed and contained multiple species therefor unable to interpret. Recollected culture.  UDS + for opiates and benzos. Tylenol and salicylates negative. COVID negative  EKG NSR, NS IVCD, Borderline T abnormalities in anterior leads, Baseline wander, Artifact Poor data quality, When compared with ECG of 07/08/2018 Poor data quality in current ECG precludes serial comparison no obvious acute changes from previous EKG  CXR with mild cardiomegaly, no edema or consolidation. CT head unremarkable CT abd w/o contrast CT renal mild bilateral hydroureteronephrosis and perinephric stranding with moderate bladder distension and no obstructing stone identified. Correlate for signs of upper tract infection. New 11 mm left renal calculus.  Ground-glass opacity and interlobular septal thickening in the lung bases suggesting edema.   -- Post void residual obtained and pt had a liter of urine in the bladder. Pt with >2L output s/p foley. Will consult urology.   1:15 PM. On reassessment, pt in no distress. She is eating ice chips and is able to hold onto the cup. Her speech is not slurred. She continues to  have no objective weakness to bilat upper extremities. BP 92 systolic. HR WNL. Satting well on RA without tachypnea.  1:35 PM Reassessed pt. She denies taking extra lithium pills. States she takes 5 tablets at night. Confirmed with pharmacy that her current dose of 1500 mg at night. Not extended release.   1:41 PM CONSULT with Dr. Meredeth Ide with nephrology who recommended repeating the Lithium level at 4pm. If it remains the same or improves then continue IVF and monitoring. If level is increasing then she will need to be transferred to [facility].   4:25 PM CONSULT with Dr. Gilford Rile with urology. States to place foley and have her f/u outpatient.  4:45 PM CONSULT with Dr. Oda Cogan who accepts pt for admission.    Pt will require admission for further tx of lithium toxicity, likely due to worsening renal function likely 2/2 urinary retention.   ED Discharge Orders    None       Rodney Booze, PA-C 07/10/18 [place], [place], DO 07/15/18 [phone]

## 2018-07-10 NOTE — ED Notes (Signed)
Per Dr. Eulis Foster, urinary catheter.

## 2018-07-10 NOTE — H&P (Signed)
TRH H&P   Patient Demographics:    Kristen [name], is a 48 y.o. female  MRN: [id number]   DOB - 1970/08/29  Admit Date - 07/10/2018  Outpatient Primary MD for the patient is Sasser, Silvestre Moment, MD  Referring MD/NP/PA: PA [place]  Outpatient Specialists: [facility]  Patient coming from: Home  Chief Complaint  Patient presents with   Dysuria      HPI:    Kristen [name]  is a 48 y.o. female, with past medical history of anxiety, bipolar disorder, depression, GERD, hyperlipidemia, kidney stones, who presents to ED today secondary to confusion, and had recent ED visit 2 days ago, she was diagnosed with pyelonephritis, started on p.o. antibiotics, then she had normal lithium level, mild elevation in creatinine, today she reports she is more confused, feeling foggy, weak, and father passed away last week, the ED her work-up was significant for elevated lithium level at 1.7, normal Tylenol and salicylate level, creatinine elevated at 1.6, CT imaging significant for bilateral hydronephrosis and distended bladder, patient denies any suicidal thoughts or ideations, reports she is taking her medication as prescribed, she is chronically on this dose of lithium for last few years, she had 2.2 urine output after Foley catheter insertion, was started on Rocephin for her pyelonephritis, patient was noted to have hemoglobin of 8.1, her Hemoccult was positive, she endorses using significant amount of ibuprofen, and Goody powders, and I was called to admit.    Review of systems:    In addition to the HPI above,  No Fever-chills, for generalized weakness and confusion No Headache, No changes with Vision or hearing, No problems swallowing food or Liquids, No Chest pain, Cough or Shortness of Breath, No Abdominal pain, ports mild nausea, but no Vommitting, Bowel movements are regular, No Blood  in stool or Urine, No dysuria, had urinary retention No new skin rashes or bruises, No new joints pains-aches,  No new focal weakness, tingling, numbness in any extremity, but reports generalized weakness and lethargy No recent weight gain or loss, No polyuria, polydypsia or polyphagia, No significant Mental Stressors.  A full 10 point Review of Systems was done, except as stated above, all other Review of Systems were negative.   With Past History of the following :    Past Medical History:  Diagnosis Date   Anxiety    Arthritis    Bipolar disorder ([place])    Depression    GERD (gastroesophageal reflux disease)    takes tums daily   Hyperlipemia    Mental disorder    bipolar   PONV (postoperative nausea and vomiting)    Stones in the urinary tract       Past Surgical History:  Procedure Laterality Date   ANTERIOR CERVICAL DECOMP/DISCECTOMY FUSION  02/17/2012   Procedure: ANTERIOR CERVICAL DECOMPRESSION/DISCECTOMY FUSION 2 LEVELS;  Surgeon: Hosie Spangle, MD;  Location: [facility];  Service: Neurosurgery;  Laterality: N/A;  Cervical four-five and Cervical six-seven anterior cervial decompression with fusion plating and bonegraft   BREAST SURGERY  95   breast reduction    CERVICAL SPINE SURGERY  04   CESAREAN SECTION     x2   DIAGNOSTIC LAPAROSCOPY     staph infection    ENDOMETRIAL ABLATION     NASAL SINUS SURGERY     POSTERIOR CERVICAL FUSION/FORAMINOTOMY N/A 08/18/2013   Procedure: CERVICAL FOUR TO CERVICAL SEVEN POSTERIOR CERVICAL FUSION/FORAMINOTOMY LEVEL 3;  Surgeon: Hosie Spangle, MD;  Location: [facility];  Service: Neurosurgery;  Laterality: N/A;  C4-7 posterior cervical fusion with lateral mass fixation   TUBAL LIGATION        Social History:     Social History   Tobacco Use   Smoking status: Former Smoker    Packs/day: 1.00    Years: 5.00    Pack years: 5.00    Last attempt to quit: 02/09/2002    Years since quitting: 16.4    Smokeless tobacco: Never Used  Substance Use Topics   Alcohol use: No     Lives -at home  Mobility -independent     Family History :     Family History  Problem Relation Age of Onset   Asthma Mother    Rheum arthritis Mother    Non-Hodgkin's lymphoma Mother    Allergies Other        entire family(mom and dad)     Home Medications:   Prior to Admission medications   Medication Sig Start Date End Date Taking? Authorizing Provider  ALPRAZolam Duanne Moron) 1 MG tablet Take 1 tablet (1 mg total) by mouth 3 (three) times daily as needed. for anxiety 04/02/18  Yes [name] T, PA-C  atorvastatin (LIPITOR) 10 MG tablet Take 10 mg by mouth daily. 11/13/17  Yes [provider]  buPROPion (WELLBUTRIN XL) 150 MG 24 hr tablet TAKE 3 TABLETS BY MOUTH IN THE MORNING 02/26/18  Yes Hurst, Teresa T, PA-C  calcium carbonate (TUMS - DOSED IN MG ELEMENTAL CALCIUM) 500 MG chewable tablet Chew 2 tablets by mouth 2 (two) times daily with a meal.   Yes [provider]  cephALEXin (KEFLEX) 500 MG capsule Take 1 capsule (500 mg total) by mouth 4 (four) times daily. 07/08/18  Yes Isla Pence, MD  cetirizine (ZYRTEC) 10 MG tablet Take 10 mg by mouth daily.   Yes [provider]  HYDROcodone-acetaminophen (NORCO/VICODIN) 5-325 MG tablet Take 1 tablet by mouth every 4 (four) hours as needed. 07/08/18  Yes Isla Pence, MD  ibuprofen (ADVIL) 600 MG tablet Take 1 tablet (600 mg total) by mouth every 6 (six) hours as needed. 07/08/18  Yes Isla Pence, MD  ibuprofen (ADVIL,MOTRIN) 200 MG tablet Take 800 mg by mouth every 6 (six) hours as needed for mild pain or moderate pain.   Yes [provider]  lamoTRIgine (LAMICTAL) 200 MG tablet Take 2.5 tablets (500 mg total) by mouth daily. 04/02/18  Yes Addison Lank, PA-C  levothyroxine (SYNTHROID) 75 MCG tablet Take 75 mcg by mouth daily. 05/27/18  Yes [provider]  lithium 300 MG tablet Take 5 tablets (1,500  mg total) by mouth at bedtime. 5 qhs 04/02/18  Yes Hurst, Teresa T, PA-C  modafinil (PROVIGIL) 200 MG tablet TAKE 1 TABLET BY MOUTH ONCE DAILY 02/26/18  Yes Hurst, Teresa T, PA-C  montelukast (SINGULAIR) 10 MG tablet Take 10 mg by mouth at bedtime.  07/08/17  Yes [provider]  ondansetron (ZOFRAN ODT) 4 MG disintegrating tablet Take 1 tablet (4 mg total) by mouth every 8 (eight) hours as needed. 07/08/18  Yes Isla Pence, MD  pramipexole (MIRAPEX) 1 MG tablet Take 2 tablets (2 mg total) by mouth at bedtime. 02/26/18  Yes Hurst, Dorothea Glassman, PA-C  propranolol (INDERAL) 20 MG tablet Take 1 tablet (20 mg total) by mouth at bedtime. 06/03/18  Yes [name] T, PA-C  QUEtiapine (SEROQUEL XR) 300 MG 24 hr tablet Take 1 tablet (300 mg total) by mouth at bedtime. 06/19/18   Addison Lank, PA-C     Allergies:     Allergies  Allergen Reactions   Iohexol Hives     Desc: IVP DYE      Physical Exam:   Vitals  Blood pressure (!) 118/56, pulse 71, temperature 98.4 F (36.9 C), temperature source Oral, resp. rate (!) 24, height 5\' 6"  (1.676 m), weight 99.7 kg, SpO2 90 %.   1. General developed female, laying in bed, in no apparent distress  2. Normal affect and insight, Not Suicidal or Homicidal, Awake Alert, Oriented X 3.  3. No F.N deficits, ALL C.Nerves Intact, Strength 5/5 all 4 extremities, Sensation intact all 4 extremities, Plantars down going.  4. Ears and Eyes appear Normal, Conjunctivae clear, PERRLA. Moist Oral Mucosa.  5. Supple Neck, No JVD, No cervical lymphadenopathy appriciated, No Carotid Bruits.  6. Symmetrical Chest wall movement, Good air movement bilaterally, CTAB.  7. RRR, No Gallops, Rubs or Murmurs, No Parasternal Heave.  8. Positive Bowel Sounds, Abdomen Soft, No tenderness, No organomegaly appriciated,No rebound -guarding or rigidity.  9.  No Cyanosis, Normal Skin Turgor, No Skin Rash or Bruise.  10. Good muscle tone,  joints appear normal , no  effusions, Normal ROM.  11. No Palpable Lymph Nodes in Neck or Axillae    Data Review:    CBC Recent Labs  Lab 07/08/18 0818 07/10/18 1215  WBC 25.7* 20.9*  HGB 10.5* 8.4*  HCT 33.4* 26.6*  PLT 327 308  MCV 92.8 92.4  MCH 29.2 29.2  MCHC 31.4 31.6  RDW 13.6 14.2  LYMPHSABS 1.3 1.3  MONOABS 1.9* 1.4*  EOSABS 0.0 0.7*  BASOSABS 0.1 0.1   ------------------------------------------------------------------------------------------------------------------  Chemistries  Recent Labs  Lab 07/08/18 0818 07/10/18 1215  NA 137 137  K 3.0* 3.1*  CL 103 107  CO2 22 21*  GLUCOSE 130* 82  BUN 12 22*  CREATININE 1.43* 1.83*  CALCIUM 9.2 8.7*  AST 15 21  ALT 16 19  ALKPHOS 95 170*  BILITOT 0.6 0.5   ------------------------------------------------------------------------------------------------------------------ estimated creatinine clearance is 44.8 mL/min (A) (by C-G formula based on SCr of 1.83 mg/dL (H)). ------------------------------------------------------------------------------------------------------------------ No results for input(s): TSH, T4TOTAL, T3FREE, THYROIDAB in the last 72 hours.  Invalid input(s): FREET3  Coagulation profile No results for input(s): INR, PROTIME in the last 168 hours. ------------------------------------------------------------------------------------------------------------------- No results for input(s): DDIMER in the last 72 hours. -------------------------------------------------------------------------------------------------------------------  Cardiac Enzymes No results for input(s): CKMB, TROPONINI, MYOGLOBIN in the last 168 hours.  Invalid input(s): CK ------------------------------------------------------------------------------------------------------------------ No results found for: BNP   ---------------------------------------------------------------------------------------------------------------  Urinalysis      Component Value Date/Time   COLORURINE YELLOW 07/10/2018 [place] 07/10/2018 1157   LABSPEC <1.005 (L) 07/10/2018 1157   PHURINE 6.5 07/10/2018 [place] 07/10/2018 1157   HGBUR SMALL (A) 07/10/2018 [place] 07/10/2018 [place] 07/10/2018 1157  PROTEINUR TRACE (A) 07/10/2018 1157   UROBILINOGEN 0.2 10/29/2008 1430   NITRITE NEGATIVE 07/10/2018 1157   LEUKOCYTESUR LARGE (A) 07/10/2018 1157    ----------------------------------------------------------------------------------------------------------------   Imaging Results:    Ct Head Wo Contrast  Result Date: 07/10/2018 CLINICAL DATA:  Altered mental status. Right-sided weakness and slurred speech. EXAM: CT HEAD WITHOUT CONTRAST TECHNIQUE: Contiguous axial images were obtained from the base of the skull through the vertex without intravenous contrast. COMPARISON:  None. FINDINGS: Brain: There is no evidence of acute infarct, intracranial hemorrhage, mass, midline shift, or extra-axial fluid collection. The ventricles and sulci are normal. Vascular: No hyperdense vessel. Skull: No fracture or focal osseous lesion. Sinuses/Orbits: Small right maxillary sinus. No evidence of acute sinus inflammation. Unremarkable orbits. Other: None. IMPRESSION: Unremarkable CT appearance of the brain. Electronically Signed   By: Logan Bores M.D.   On: 07/10/2018 14:57   Dg Chest Port 1 View  Result Date: 07/10/2018 CLINICAL DATA:  Confusion with hallucinations EXAM: PORTABLE CHEST 1 VIEW COMPARISON:  Jul 08, 2018 FINDINGS: There is no appreciable edema or consolidation. Heart is mildly enlarged with pulmonary vascularity normal. No adenopathy. There is postoperative change in the lower cervical region. IMPRESSION: Mild cardiomegaly.  No edema or consolidation. Electronically Signed   By: Lowella Grip III M.D.   On: 07/10/2018 12:31   Ct Renal Stone Study  Result Date:  07/10/2018 CLINICAL DATA:  Back/flank pain. EXAM: CT ABDOMEN AND PELVIS WITHOUT CONTRAST TECHNIQUE: Multidetector CT imaging of the abdomen and pelvis was performed following the standard protocol without IV contrast. COMPARISON:  08/05/2011 FINDINGS: Lower chest: Motion artifact in the lung bases with interlobular septal thickening and ground-glass opacity bilaterally. Mild bibasilar atelectasis. No pleural effusion. Hepatobiliary: No focal liver abnormality is seen. No gallstones, gallbladder wall thickening, or biliary dilatation. Pancreas: Unremarkable. Spleen: Unremarkable. Adrenals/Urinary Tract: Unremarkable adrenal glands. New 11 mm calculus in the interpolar left kidney. Punctate right renal calculi on the prior CT are no longer visualized. Mild bilateral hydroureteronephrosis, left greater than right, with bilateral perinephric and periureteral stranding. No ureteral calculi. Moderately distended bladder. Stomach/Bowel: The stomach is unremarkable. There is no evidence of bowel obstruction. Mild left-sided colonic diverticulosis is noted without evidence of acute diverticulitis. The appendix is unremarkable. Vascular/Lymphatic: Normal caliber of the abdominal aorta. No enlarged lymph nodes. Reproductive: Chronic nodular appearance of the uterus suggesting underlying fibroids. No adnexal mass. Other: No ascites or pneumoperitoneum. Musculoskeletal: Chronic advanced lower lumbar disc degeneration involving the last fully formed disc space. IMPRESSION: 1. Mild bilateral hydroureteronephrosis and perinephric stranding with moderate bladder distension and no obstructing stone identified. Correlate for signs of upper tract infection. 2. New 11 mm left renal calculus. 3. Ground-glass opacity and interlobular septal thickening in the lung bases suggesting edema. Electronically Signed   By: Logan Bores M.D.   On: 07/10/2018 15:07    My personal review of EKG: Rhythm NSR, Rate  68  /min, QTc 475 , no Acute ST  changes   Assessment & Plan:    Active Problems:   GAD (generalized anxiety disorder)   Bipolar I disorder (H[facility])   AKI (acute kidney injury) ([place])   Lithium toxicity   Encephalopathy   Acute urinary retention    Lithium toxicity -PAtientwith no recent changes in her lithium dosing, she has been on current dose for last few years, this is acute on chronic in the setting of AKI causing accumulation of her lithium in her body. -Patient back to baseline, no evidence of heart  block, with him trending down, currently no indication for hemodialysis, will continue to monitor lithium level closely, enter here in ICU, with continuous telemetry monitoring, continue to hold lithium  Acute Pyelonephritis -Continue with IV fluids, continue with IV Rocephin every 24 hours, patient recent cultures unhelpful as it is growing multiple species.  Acute urinary  retention bilateral hydronephrosis -Most likely in the setting of pyelonephritis/UTI, and polypharmacy with lithium toxicity, imaging significant for bilateral hydronephrosis, she had 2.2 cc urine output after Foley catheter insertion.  AKI -This is in the setting of urinary retention, pyelonephritis, significant NSAIDs use -Continue with IV fluids, hold NSAIDs, hold nephrotoxic medication.  Upper GI bleed -He is Hemoccult positive, this most likely in the setting of heavy NSAIDs use, she reports she took 4 Advil yesterday, and Goody powder, she relies on NSAIDs heavily. -We will start on Protonix drip. -Place consult for GI.  Acute blood loss anemia -In the setting of upper GI bleed secondary to gastritis, possible ulcers with heavy NSAID use, hemoglobin is 8.1, I will obtain anemia panel, keep active type and screen and transfuse for hemoglobin less than 7.  GAD/Bipolar disorder -Hold all her psychiatric medications, resume once renal function and proved and renal function back to baseline, she is following with [facility] in  [place].  Hypothyroidism -Continue with Synthroid  Acute toxic encephalopathy -Does report confusion, altered mental status, this is in the setting of polypharmacy accumulating in her system with AKI, acute on chronic lithium toxicity, she appears currently back to baseline.  Hypokalemia - repleted , recheck in am  DVT Prophylaxis SCDs   AM Labs Ordered, also please review Full Orders  Family Communication: Admission, patients condition and plan of care including tests being ordered have been discussed with the patient  who indicate understanding and agree with the plan and Code Status.  Code Status Full  Likely DC to  Home  Condition GUARDED    Consults called: None  Admission status: Inpatient  Time spent in minutes : 65 minutes   Phillips Climes M.D on 07/10/2018 at 5:03 PM  Between 7am to 7pm - Pager - [phone]. After 7pm go to [email or web address] - password [facility]  [facility] - Office  [phone]

## 2018-07-10 NOTE — ED Notes (Signed)
At [facility] request, bladder scanner done - 1000 ml

## 2018-07-11 ENCOUNTER — Encounter (HOSPITAL_COMMUNITY): Payer: Self-pay | Admitting: Gastroenterology

## 2018-07-11 DIAGNOSIS — D649 Anemia, unspecified: Secondary | ICD-10-CM

## 2018-07-11 DIAGNOSIS — G9340 Encephalopathy, unspecified: Secondary | ICD-10-CM

## 2018-07-11 DIAGNOSIS — F411 Generalized anxiety disorder: Secondary | ICD-10-CM

## 2018-07-11 LAB — COMPREHENSIVE METABOLIC PANEL
ALT: 19 U/L (ref 0–44)
AST: 18 U/L (ref 15–41)
Albumin: 2.7 g/dL — ABNORMAL LOW (ref 3.5–5.0)
Alkaline Phosphatase: 176 U/L — ABNORMAL HIGH (ref 38–126)
Anion gap: 9 (ref 5–15)
BUN: 13 mg/dL (ref 6–20)
CO2: 19 mmol/L — ABNORMAL LOW (ref 22–32)
Calcium: 8.7 mg/dL — ABNORMAL LOW (ref 8.9–10.3)
Chloride: 113 mmol/L — ABNORMAL HIGH (ref 98–111)
Creatinine, Ser: 1.43 mg/dL — ABNORMAL HIGH (ref 0.44–1.00)
GFR calc Af Amer: 50 mL/min — ABNORMAL LOW (ref >60)
GFR calc non Af Amer: 43 mL/min — ABNORMAL LOW (ref >60)
Glucose, Bld: 109 mg/dL — ABNORMAL HIGH (ref 70–99)
Potassium: 3.8 mmol/L (ref 3.5–5.1)
Sodium: 141 mmol/L (ref 135–145)
Total Bilirubin: 0.5 mg/dL (ref 0.3–1.2)
Total Protein: 6.1 g/dL — ABNORMAL LOW (ref 6.5–8.1)

## 2018-07-11 LAB — CBC
HCT: 25.5 % — ABNORMAL LOW (ref 36.0–46.0)
Hemoglobin: 7.9 g/dL — ABNORMAL LOW (ref 12.0–15.0)
MCH: 29.3 pg (ref 26.0–34.0)
MCHC: 31 g/dL (ref 30.0–36.0)
MCV: 94.4 fL (ref 80.0–100.0)
Platelets: 348 10*3/uL (ref 150–400)
RBC: 2.7 MIL/uL — ABNORMAL LOW (ref 3.87–5.11)
RDW: 14.5 % (ref 11.5–15.5)
WBC: 20.2 10*3/uL — ABNORMAL HIGH (ref 4.0–10.5)
nRBC: 0 % (ref 0.0–0.2)

## 2018-07-11 LAB — MRSA PCR SCREENING: MRSA by PCR: NEGATIVE

## 2018-07-11 LAB — URINE CULTURE: Culture: NO GROWTH

## 2018-07-11 LAB — LITHIUM LEVEL: Lithium Lvl: 1.08 mmol/L (ref 0.60–1.20)

## 2018-07-11 LAB — GLUCOSE, CAPILLARY: Glucose-Capillary: 79 mg/dL (ref 70–99)

## 2018-07-11 LAB — HIV ANTIBODY (ROUTINE TESTING W REFLEX): HIV Screen 4th Generation wRfx: NONREACTIVE

## 2018-07-11 MED ORDER — POLYETHYLENE GLYCOL 3350 17 G PO PACK
17.0000 g | PACK | Freq: Two times a day (BID) | ORAL | Status: DC
  Administered 2018-07-11 – 2018-07-12 (×3): 17 g via ORAL
  Filled 2018-07-11 (×3): qty 1

## 2018-07-11 MED ORDER — HEPARIN SODIUM (PORCINE) 5000 UNIT/ML IJ SOLN
5000.0000 [IU] | Freq: Three times a day (TID) | INTRAMUSCULAR | Status: DC
  Administered 2018-07-11 – 2018-07-12 (×3): 5000 [IU] via SUBCUTANEOUS
  Filled 2018-07-11 (×3): qty 1

## 2018-07-11 MED ORDER — CHLORHEXIDINE GLUCONATE CLOTH 2 % EX PADS
6.0000 | MEDICATED_PAD | Freq: Every day | CUTANEOUS | Status: DC
  Administered 2018-07-11: 6 via TOPICAL

## 2018-07-11 MED ORDER — PANTOPRAZOLE SODIUM 40 MG PO TBEC
40.0000 mg | DELAYED_RELEASE_TABLET | Freq: Two times a day (BID) | ORAL | Status: DC
  Administered 2018-07-11 – 2018-07-12 (×2): 40 mg via ORAL
  Filled 2018-07-11 (×2): qty 1

## 2018-07-11 MED ORDER — ALPRAZOLAM 0.5 MG PO TABS
0.5000 mg | ORAL_TABLET | Freq: Two times a day (BID) | ORAL | Status: DC | PRN
  Administered 2018-07-11 – 2018-07-12 (×2): 0.5 mg via ORAL
  Filled 2018-07-11 (×2): qty 1

## 2018-07-11 MED ORDER — LITHIUM CARBONATE 300 MG PO CAPS
1200.0000 mg | ORAL_CAPSULE | Freq: Every day | ORAL | Status: DC
  Filled 2018-07-11: qty 4

## 2018-07-11 MED ORDER — PANTOPRAZOLE SODIUM 40 MG IV SOLR
INTRAVENOUS | Status: AC
  Filled 2018-07-11: qty 80

## 2018-07-11 NOTE — Progress Notes (Signed)
PT's urine output 7,050 mL for the shift.

## 2018-07-11 NOTE — Progress Notes (Signed)
Patient Demographics:    Kristen [name], is a 48 y.o. female, DOB - Jun 26, 1970, IRW:[id number]  Admit date - 07/10/2018   Admitting Physician Albertine Patricia, MD  Outpatient Primary MD for the patient is Sasser, Silvestre Moment, MD  LOS - 1   Chief Complaint  Patient presents with   Dysuria        Subjective:    Lucky Cowboy today has no fevers, no emesis,  No chest pain, polydipsia and polyuria persist,  Assessment  & Plan :    Principal Problem:   Lithium toxicity Active Problems:   AKI (acute kidney injury) ([place])   Anemia   GAD (generalized anxiety disorder)   Bipolar I disorder ([place])   Encephalopathy   Acute urinary retention  Brief Summary 48 year old female with PMHx of anxiety, bipolar disorder, depression, GERD, hyperlipidemia, kidney stones admitted on 07/10/18 with acute pyelonephritis, urinary retention,  lithium toxicity in setting of acute on chronic kidney injury, found to have normocytic anemia with heme positive stool. Anemia panel with normal ferritin, low iron. No known history of anemia per patient.   A/p 1)Lithium Toxicity--- polyuria and polydipsia persist, serum lithium is down to 1.0, discussed with Dr. Frann Rider, okay to restart lithium on 07/12/2018  2)Presumed Pyelonephritis with mild bilateral hydro-ureter nephrosis--- patient is voiding well--- continue IV Rocephin pending urine cultures, WBC is 20 K  3)AKI----Vs progression of CKD -- creatinine on admission= 3,   baseline creatinine =   no recent creatinine available (previously normal in 2015), creatinine is now=1.43 , renally adjust medications, avoid nephrotoxic agents/dehydration/hypotension-- Suspect UTI/pyelonephritis and NSAID use contributing to worsening renal function, hydrate gently. curbside consultation with Dr. Marval Regal from nephrology service  4)Acute Versus acute on chronic Anemia--- suspect acute  blood loss anemia, last available hemoglobin was 2.6 in June 2015,, admission hemoglobin 8.4 on 07/10/2018, hemoglobin was 10.5 on 07/08/2018, hemoglobin drifting down with hydration and now down to 7.9--- there is Hemoccult positive--GI plans to do EGD prior to discharge and possibly colonoscopy as outpatient... Transfuse as clinically dictated  5)GAD/Bipolar--denying suicidal ideation or plan, she usually follows with [facility] in [place] to restart lithium p.m. on 07/12/2018  6)Acute toxic metabolic encephalopathy----it was most likely due to poor renal clearance of a psychiatric medications with worsening of function, compounded by UTI and dehydration.----Cognitive and mental function back to baseline, encephalopathy has resolved  Disposition/Need for in-Hospital Stay- patient unable to be discharged at this time due to pyelonephritis requiring IV antibiotics pending urine cultures, acute kidney injury quiring further monitoring and iv hydration, acute blood loss anemia requiring endoluminal evaluation  Code Status : Full  Family Communication:   na  Disposition Plan  : home  Consults  :  Gi/curbside consultation with Dr. Marval Regal from nephrology service  DVT Prophylaxis  :   Heparin /- SCDs   Lab Results  Component Value Date   PLT 348 07/11/2018    Inpatient Medications  Scheduled Meds:  atorvastatin  10 mg Oral Daily   calcium carbonate  2 tablet Oral BID WC   Chlorhexidine Gluconate Cloth  6 each Topical Daily   levothyroxine  75 mcg Oral Daily   [START ON 07/12/2018] lithium carbonate  1,200 mg Oral QHS   loratadine  10  mg Oral Daily   montelukast  10 mg Oral QHS   pantoprazole  40 mg Oral BID AC   polyethylene glycol  17 g Oral BID   Continuous Infusions:  sodium chloride 20 mL/hr at 07/11/18 1502   cefTRIAXone (ROCEPHIN)  IV Stopped (07/10/18 1751)   PRN Meds:.acetaminophen **OR** acetaminophen    Anti-infectives (From admission,  onward)   Start     Dose/Rate Route Frequency Ordered Stop   07/10/18 1645  cefTRIAXone (ROCEPHIN) 1 g in sodium chloride 0.9 % 100 mL IVPB     1 g 200 mL/hr over 30 Minutes Intravenous Every 24 hours 07/10/18 1641     07/10/18 1200  cefTRIAXone (ROCEPHIN) 1 g in sodium chloride 0.9 % 100 mL IVPB     1 g 200 mL/hr over 30 Minutes Intravenous  Once 07/10/18 1158 07/10/18 1316        Objective:   Vitals:   07/11/18 1200 07/11/18 1300 07/11/18 1400 07/11/18 1500  BP: (!) 106/57 121/70 125/65 121/60  Pulse: 78 74 77 74  Resp: 20 18 20 18   Temp:      TempSrc:      SpO2: 95% 96% 94% 95%  Weight:      Height:        Wt Readings from Last 3 Encounters:  07/10/18 99.7 kg  07/08/18 99.8 kg  08/09/13 104.3 kg    Intake/Output Summary (Last 24 hours) at 07/11/2018 1505 Last data filed at 07/11/2018 1502 Gross per 24 hour  Intake 7135.08 ml  Output 16635 ml  Net -9499.92 ml    Physical Exam Patient is examined daily including today on 07/11/18 , exams remain the same as of yesterday except that has changed   Gen:- Awake Alert, obese, in no apparent distress  HEENT:- [place].AT, No sclera icterus Neck-Supple Neck,No JVD,.  Lungs-diminished in bases, no significant adventitious sounds at this time  CV- S1, S2 normal, regular  Abd-  +ve B.Sounds, Abd Soft, No tenderness, increased truncal adiposity    Extremity/Skin:- trace pedal  edema, pedal pulses present  Psych-affect is appropriate, oriented x3 Neuro-no new focal deficits, no tremors   Data Review:   Micro Results Recent Results (from the past 240 hour(s))  Urine culture     Status: Abnormal   Collection Time: 07/08/18  8:18 AM  Result Value Ref Range Status   Specimen Description   Final    URINE, CLEAN CATCH Performed at [facility], [address], [place], [place] [address]    Special Requests   Final    Normal Performed at [facility], [address], [place], [place] [address]    Culture MULTIPLE SPECIES PRESENT,  SUGGEST RECOLLECTION (A)  Final   Report Status 07/09/2018 FINAL  Final  SARS Coronavirus 2 (CEPHEID- Performed in [place] hospital lab), Hosp Order     Status: None   Collection Time: 07/08/18  8:18 AM  Result Value Ref Range Status   SARS Coronavirus 2 NEGATIVE NEGATIVE Final    Comment: (NOTE) If result is NEGATIVE SARS-CoV-2 target nucleic acids are NOT DETECTED. The SARS-CoV-2 RNA is generally detectable in upper and lower  respiratory specimens during the acute phase of infection. The lowest  concentration of SARS-CoV-2 viral copies this assay can detect is 250  copies / mL. A negative result does not preclude SARS-CoV-2 infection  and should not be used as the sole basis for treatment or other  patient management decisions.  A negative result may occur with  improper specimen collection / handling, submission of specimen other  than nasopharyngeal swab, presence of viral mutation(s) within the  areas targeted by this assay, and inadequate number of viral copies  (<250 copies / mL). A negative result must be combined with clinical  observations, patient history, and epidemiological information. If result is POSITIVE SARS-CoV-2 target nucleic acids are DETECTED. The SARS-CoV-2 RNA is generally detectable in upper and lower  respiratory specimens dur ing the acute phase of infection.  Positive  results are indicative of active infection with SARS-CoV-2.  Clinical  correlation with patient history and other diagnostic information is  necessary to determine patient infection status.  Positive results do  not rule out bacterial infection or co-infection with other viruses. If result is PRESUMPTIVE POSTIVE SARS-CoV-2 nucleic acids MAY BE PRESENT.   A presumptive positive result was obtained on the submitted specimen  and confirmed on repeat testing.  While 2019 novel coronavirus  (SARS-CoV-2) nucleic acids may be present in the submitted sample  additional confirmatory testing  may be necessary for epidemiological  and / or clinical management purposes  to differentiate between  SARS-CoV-2 and other Sarbecovirus currently known to infect humans.  If clinically indicated additional testing with an alternate test  methodology [phone]) is advised. The SARS-CoV-2 RNA is generally  detectable in upper and lower respiratory sp ecimens during the acute  phase of infection. The expected result is Negative. Fact Sheet for Patients:  StrictlyIdeas.no Fact Sheet for Healthcare Providers: BankingDealers.co.za This test is not yet approved or cleared by the [place] FDA and has been authorized for detection and/or diagnosis of SARS-CoV-2 by FDA under an Emergency Use Authorization (EUA).  This EUA will remain in effect (meaning this test can be used) for the duration of the COVID-19 declaration under Section 564(b)(1) of the Act, 21 U.S.C. section 360bbb-3(b)(1), unless the authorization is terminated or revoked sooner. Performed at [facility], [address], [place], [place] [address]   SARS Coronavirus 2 (CEPHEID- Performed in [facility] hospital lab), Hosp Order     Status: None   Collection Time: 07/10/18 11:58 AM  Result Value Ref Range Status   SARS Coronavirus 2 NEGATIVE NEGATIVE Final    Comment: (NOTE) If result is NEGATIVE SARS-CoV-2 target nucleic acids are NOT DETECTED. The SARS-CoV-2 RNA is generally detectable in upper and lower  respiratory specimens during the acute phase of infection. The lowest  concentration of SARS-CoV-2 viral copies this assay can detect is 250  copies / mL. A negative result does not preclude SARS-CoV-2 infection  and should not be used as the sole basis for treatment or other  patient management decisions.  A negative result may occur with  improper specimen collection / handling, submission of specimen other  than nasopharyngeal swab, presence of viral mutation(s) within  the  areas targeted by this assay, and inadequate number of viral copies  (<250 copies / mL). A negative result must be combined with clinical  observations, patient history, and epidemiological information. If result is POSITIVE SARS-CoV-2 target nucleic acids are DETECTED. The SARS-CoV-2 RNA is generally detectable in upper and lower  respiratory specimens dur ing the acute phase of infection.  Positive  results are indicative of active infection with SARS-CoV-2.  Clinical  correlation with patient history and other diagnostic information is  necessary to determine patient infection status.  Positive results do  not rule out bacterial infection or co-infection with other viruses. If result is PRESUMPTIVE POSTIVE SARS-CoV-2 nucleic acids MAY BE  PRESENT.   A presumptive positive result was obtained on the submitted specimen  and confirmed on repeat testing.  While 2019 novel coronavirus  (SARS-CoV-2) nucleic acids may be present in the submitted sample  additional confirmatory testing may be necessary for epidemiological  and / or clinical management purposes  to differentiate between  SARS-CoV-2 and other Sarbecovirus currently known to infect humans.  If clinically indicated additional testing with an alternate test  methodology [phone]) is advised. The SARS-CoV-2 RNA is generally  detectable in upper and lower respiratory sp ecimens during the acute  phase of infection. The expected result is Negative. Fact Sheet for Patients:  StrictlyIdeas.no Fact Sheet for Healthcare Providers: BankingDealers.co.za This test is not yet approved or cleared by the [place] FDA and has been authorized for detection and/or diagnosis of SARS-CoV-2 by FDA under an Emergency Use Authorization (EUA).  This EUA will remain in effect (meaning this test can be used) for the duration of the COVID-19 declaration under Section 564(b)(1) of the Act, 21  U.S.C. section 360bbb-3(b)(1), unless the authorization is terminated or revoked sooner. Performed at [facility], [address], [place], [place] [address]   Urine culture     Status: None   Collection Time: 07/10/18 11:59 AM  Result Value Ref Range Status   Specimen Description   Final    URINE, CLEAN CATCH Performed at [facility], [address] [place], [place] [address]    Special Requests   Final    NONE Performed at [facility], [address] [place], [place] [address]    Culture   Final    NO GROWTH Performed at [facility], [place] [address] [place], [place] [address]    Report Status 07/11/2018 FINAL  Final  Blood Culture (routine x 2)     Status: None (Preliminary result)   Collection Time: 07/10/18 12:29 PM  Result Value Ref Range Status   Specimen Description BLOOD  Final   Special Requests   Final    BOTTLES DRAWN AEROBIC AND ANAEROBIC Blood Culture adequate volume   Culture   Final    NO GROWTH < 24 HOURS Performed at [facility], [address], [place], [place] [address]    Report Status PENDING  Incomplete  Blood Culture (routine x 2)     Status: None (Preliminary result)   Collection Time: 07/10/18 12:29 PM  Result Value Ref Range Status   Specimen Description BLOOD LEFT HAND  Final   Special Requests   Final    BOTTLES DRAWN AEROBIC AND ANAEROBIC Blood Culture results may not be optimal due to an inadequate volume of blood received in culture bottles   Culture   Final    NO GROWTH < 24 HOURS Performed at [facility], [address] [place], [place] [address]    Report Status PENDING  Incomplete  MRSA PCR Screening     Status: None   Collection Time: 07/10/18  5:40 PM  Result Value Ref Range Status   MRSA by PCR NEGATIVE NEGATIVE Final    Comment:        The GeneXpert MRSA Assay (FDA approved for NASAL specimens only), is one component of a comprehensive MRSA colonization surveillance program. It is not intended to diagnose  MRSA infection nor to guide or monitor treatment for MRSA infections. Performed at [facility], [address] [place],  [address]     Radiology Reports Ct Head Wo Contrast  Result Date: 07/10/2018 CLINICAL DATA:  Altered mental status. Right-sided weakness and slurred speech. EXAM: CT HEAD WITHOUT CONTRAST TECHNIQUE: Contiguous axial images were obtained from the base of the skull through the vertex without intravenous contrast. COMPARISON:  None. FINDINGS: Brain: There is no evidence of acute infarct, intracranial hemorrhage, mass, midline shift, or extra-axial fluid collection. The ventricles and sulci are normal. Vascular: No hyperdense vessel. Skull: No fracture or focal osseous lesion. Sinuses/Orbits: Small right maxillary sinus. No evidence of acute sinus inflammation. Unremarkable orbits. Other: None. IMPRESSION: Unremarkable CT appearance of the brain. Electronically Signed   By: Logan Bores M.D.   On: 07/10/2018 14:57   Dg Chest Port 1 View  Result Date: 07/10/2018 CLINICAL DATA:  Confusion with hallucinations EXAM: PORTABLE CHEST 1 VIEW COMPARISON:  Jul 08, 2018 FINDINGS: There is no appreciable edema or consolidation. Heart is mildly enlarged with pulmonary vascularity normal. No adenopathy. There is postoperative change in the lower cervical region. IMPRESSION: Mild cardiomegaly.  No edema or consolidation. Electronically Signed   By: Lowella Grip III M.D.   On: 07/10/2018 12:31   Dg Chest Port 1 View  Result Date: 07/08/2018 CLINICAL DATA:  Shortness of breath, cough, and fever. EXAM: PORTABLE CHEST 1 VIEW COMPARISON:  None. FINDINGS: Heart size is normal. Lung volumes are low. No focal airspace disease is present. IMPRESSION: No active disease. Electronically Signed   By: San Morelle M.D.   On: 07/08/2018 07:58   Ct Renal Stone Study  Result Date: 07/10/2018 CLINICAL DATA:  Back/flank pain. EXAM: CT ABDOMEN AND PELVIS WITHOUT CONTRAST TECHNIQUE:  Multidetector CT imaging of the abdomen and pelvis was performed following the standard protocol without IV contrast. COMPARISON:  08/05/2011 FINDINGS: Lower chest: Motion artifact in the lung bases with interlobular septal thickening and ground-glass opacity bilaterally. Mild bibasilar atelectasis. No pleural effusion. Hepatobiliary: No focal liver abnormality is seen. No gallstones, gallbladder wall thickening, or biliary dilatation. Pancreas: Unremarkable. Spleen: Unremarkable. Adrenals/Urinary Tract: Unremarkable adrenal glands. New 11 mm calculus in the interpolar left kidney. Punctate right renal calculi on the prior CT are no longer visualized. Mild bilateral hydroureteronephrosis, left greater than right, with bilateral perinephric and periureteral stranding. No ureteral calculi. Moderately distended bladder. Stomach/Bowel: The stomach is unremarkable. There is no evidence of bowel obstruction. Mild left-sided colonic diverticulosis is noted without evidence of acute diverticulitis. The appendix is unremarkable. Vascular/Lymphatic: Normal caliber of the abdominal aorta. No enlarged lymph nodes. Reproductive: Chronic nodular appearance of the uterus suggesting underlying fibroids. No adnexal mass. Other: No ascites or pneumoperitoneum. Musculoskeletal: Chronic advanced lower lumbar disc degeneration involving the last fully formed disc space. IMPRESSION: 1. Mild bilateral hydroureteronephrosis and perinephric stranding with moderate bladder distension and no obstructing stone identified. Correlate for signs of upper tract infection. 2. New 11 mm left renal calculus. 3. Ground-glass opacity and interlobular septal thickening in the lung bases suggesting edema. Electronically Signed   By: Logan Bores M.D.   On: 07/10/2018 15:07     CBC Recent Labs  Lab 07/08/18 0818 07/10/18 1215 07/10/18 2223 07/11/18 0422  WBC 25.7* 20.9* 20.1* 20.2*  HGB 10.5* 8.4* 8.3* 7.9*  HCT 33.4* 26.6* 27.1* 25.5*  PLT  327 308 343 348  MCV 92.8 92.4 94.8 94.4  MCH 29.2 29.2 29.0 29.3  MCHC 31.4 31.6 30.6 31.0  RDW 13.6 14.2 14.5 14.5  LYMPHSABS 1.3 1.3  --   --   MONOABS 1.9* 1.4*  --   --   EOSABS 0.0 0.7*  --   --   BASOSABS  0.1 0.1  --   --     Chemistries  Recent Labs  Lab 07/08/18 0818 07/10/18 1215 07/10/18 2223 07/11/18 0422  NA 137 137 142 141  K 3.0* 3.1* 3.4* 3.8  CL 103 107 114* 113*  CO2 22 21* 19* 19*  GLUCOSE 130* 82 124* 109*  BUN 12 22* 16 13  CREATININE 1.43* 1.83* 1.56* 1.43*  CALCIUM 9.2 8.7* 8.7* 8.7*  MG  --   --  2.5*  --   AST 15 21 22 18   ALT 16 19 19 19   ALKPHOS 95 170* 177* 176*  BILITOT 0.6 0.5 0.5 0.5   ------------------------------------------------------------------------------------------------------------------ No results for input(s): CHOL, HDL, LDLCALC, TRIG, CHOLHDL, LDLDIRECT in the last 72 hours.  Lab Results  Component Value Date   HGBA1C  05/04/2008    4.7 (NOTE)   The ADA recommends the following therapeutic goal for glycemic   control related to Hgb A1C measurement:   Goal of Therapy:   < 7.0% Hgb A1C   Reference: American Diabetes Association: Clinical Practice   Recommendations 2008, Diabetes Care,  2008, 31:(Suppl 1).   ------------------------------------------------------------------------------------------------------------------ No results for input(s): TSH, T4TOTAL, T3FREE, THYROIDAB in the last 72 hours.  Invalid input(s): FREET3 ------------------------------------------------------------------------------------------------------------------ Recent Labs    07/10/18 1615 07/10/18 1703  VITAMINB12 301  --   FOLATE 8.9  --   FERRITIN 289  --   TIBC 252  --   IRON 14*  --   RETICCTPCT  --  0.7    Coagulation profile No results for input(s): INR, PROTIME in the last 168 hours.  No results for input(s): DDIMER in the last 72 hours.  Cardiac Enzymes No results for input(s): CKMB, TROPONINI, MYOGLOBIN in the last 168  hours.  Invalid input(s): CK ------------------------------------------------------------------------------------------------------------------ No results found for: BNP   Roxan Hockey M.D on 07/11/2018 at 3:05 PM  Go to [email or web address] - for contact info  [facility] - Office  [phone]

## 2018-07-11 NOTE — Consult Note (Addendum)
Referring Provider: Dr. Waldron Labs Primary Care Physician:  Manon Hilding, MD Primary Gastroenterologist:  Dr. Gala Romney   Date of Admission: 07/04/18 Date of Consultation: 07/11/18  Reason for Consultation:  UGI bleed   HPI:  Kristen [name] is a 48 y.o. year old female who presented to the ED yesterday via EMS secondary to confusion. Several days ago was diagnosed with pyelonephritis and started on oral antibiotics. ED evaluation with elevated lithium level,worsening renal function, CT with mild bilateral hydroureteronephrosis and perinephric stranding, moderate bladder distension and no obstruction stones. New 11 mm left renal calculus. Ground-glass opacity and interlobular septal thickening in lung bases suggesting edema. She was found to have Hgb 10.5 several days ago during ED admission, with Hgb 8.4 yesterday. This morning 7.9. Heme positive. She has had no melena, hematochezia, hematemesis. Due to concern for UGI bleed, GI has been consulted. Ferritin was normal at 289. Iron low at 14.   Patient denies any known history of anemia.  No overt GI bleeding. Chronic constipation with BMs usually about once a week, which is her baseline. Was told to take Miralax 2 capfuls each evening by PCP, which she hasn't tried. Having a lot of gas currently. Last BM unknown. No abdominal pain. N/V prior to admission for one day. No fever/chills. History of GERD. No dysphagia. Takes Advil about once a week. Took Goody powder yesterday morning due to headache. No weight loss or lack of appetite. Colonoscopy due this year, stating she has had to have 5 year surveillance due to personal history of polyps and family history of colon cancer (father). Believes Dr. Britta Mccreedy performed.  Last colonoscopy at [facility]. EGD possibly 20 years ago due to history of GERD. Has consultation appointment in September 2020 with Dr. Ladona Horns for colonoscopy.   Overnight, she drank 7 pitchers of water, increased thirst. Now on fluid  restriction. She feels short of breath at rest and is on 0.5 liters nasal cannula O2. Coughing intermittently during exam. Her father passed away from multiple myeloma, and the funeral is today.   Past Medical History:  Diagnosis Date  . Anxiety   . Arthritis   . Bipolar disorder ([place])   . Depression   . GERD (gastroesophageal reflux disease)    takes tums daily  . Hyperlipemia   . Mental disorder    bipolar  . PONV (postoperative nausea and vomiting)   . Stones in the urinary tract     Past Surgical History:  Procedure Laterality Date  . ANTERIOR CERVICAL DECOMP/DISCECTOMY FUSION  02/17/2012   Procedure: ANTERIOR CERVICAL DECOMPRESSION/DISCECTOMY FUSION 2 LEVELS;  Surgeon: Hosie Spangle, MD;  Location: [place] [facility];  Service: Neurosurgery;  Laterality: N/A;  Cervical four-five and Cervical six-seven anterior cervial decompression with fusion plating and bonegraft  . BREAST SURGERY  95   breast reduction   . CERVICAL SPINE SURGERY  4  . CESAREAN SECTION     x2  . COLONOSCOPY     remote past, ?2015, Dr. Britta Mccreedy. Personal history of polyps  . DIAGNOSTIC LAPAROSCOPY     staph infection   . ENDOMETRIAL ABLATION    . ESOPHAGOGASTRODUODENOSCOPY     20 years ago.   Marland Kitchen NASAL SINUS SURGERY    . POSTERIOR CERVICAL FUSION/FORAMINOTOMY N/A 08/18/2013   Procedure: CERVICAL FOUR TO CERVICAL SEVEN POSTERIOR CERVICAL FUSION/FORAMINOTOMY LEVEL 3;  Surgeon: Hosie Spangle, MD;  Location: [place] [facility];  Service: Neurosurgery;  Laterality: N/A;  C4-7 posterior cervical fusion with lateral mass fixation  .  TUBAL LIGATION      Prior to Admission medications   Medication Sig Start Date End Date Taking? Authorizing Provider  ALPRAZolam Duanne Moron) 1 MG tablet Take 1 tablet (1 mg total) by mouth 3 (three) times daily as needed. for anxiety 04/02/18  Yes [name] T, PA-C  atorvastatin (LIPITOR) 10 MG tablet Take 10 mg by mouth daily. 11/13/17  Yes [provider]  buPROPion (WELLBUTRIN XL)  150 MG 24 hr tablet TAKE 3 TABLETS BY MOUTH IN THE MORNING 02/26/18  Yes Hurst, Teresa T, PA-C  calcium carbonate (TUMS - DOSED IN MG ELEMENTAL CALCIUM) 500 MG chewable tablet Chew 2 tablets by mouth 2 (two) times daily with a meal.   Yes [provider]  cephALEXin (KEFLEX) 500 MG capsule Take 1 capsule (500 mg total) by mouth 4 (four) times daily. 07/08/18  Yes Isla Pence, MD  cetirizine (ZYRTEC) 10 MG tablet Take 10 mg by mouth daily.   Yes [provider]  HYDROcodone-acetaminophen (NORCO/VICODIN) 5-325 MG tablet Take 1 tablet by mouth every 4 (four) hours as needed. 07/08/18  Yes Isla Pence, MD  ibuprofen (ADVIL) 600 MG tablet Take 1 tablet (600 mg total) by mouth every 6 (six) hours as needed. 07/08/18  Yes Isla Pence, MD  ibuprofen (ADVIL,MOTRIN) 200 MG tablet Take 800 mg by mouth every 6 (six) hours as needed for mild pain or moderate pain.   Yes [provider]  lamoTRIgine (LAMICTAL) 200 MG tablet Take 2.5 tablets (500 mg total) by mouth daily. 04/02/18  Yes Addison Lank, PA-C  levothyroxine (SYNTHROID) 75 MCG tablet Take 75 mcg by mouth daily. 05/27/18  Yes [provider]  lithium 300 MG tablet Take 5 tablets (1,500 mg total) by mouth at bedtime. 5 qhs 04/02/18  Yes Hurst, Teresa T, PA-C  modafinil (PROVIGIL) 200 MG tablet TAKE 1 TABLET BY MOUTH ONCE DAILY 02/26/18  Yes Hurst, Teresa T, PA-C  montelukast (SINGULAIR) 10 MG tablet Take 10 mg by mouth at bedtime.  07/08/17  Yes [provider]  ondansetron (ZOFRAN ODT) 4 MG disintegrating tablet Take 1 tablet (4 mg total) by mouth every 8 (eight) hours as needed. 07/08/18  Yes Isla Pence, MD  pramipexole (MIRAPEX) 1 MG tablet Take 2 tablets (2 mg total) by mouth at bedtime. 02/26/18  Yes Hurst, Dorothea Glassman, PA-C  propranolol (INDERAL) 20 MG tablet Take 1 tablet (20 mg total) by mouth at bedtime. 06/03/18  Yes [name] T, PA-C  QUEtiapine (SEROQUEL XR) 300 MG 24 hr tablet Take 1 tablet  (300 mg total) by mouth at bedtime. 06/19/18   Addison Lank, PA-C    Current Facility-Administered Medications  Medication Dose Route Frequency Provider Last Rate Last Dose  . 0.9 %  sodium chloride infusion   Intravenous Continuous Lovey Newcomer T, NP 50 mL/hr at 07/11/18 0932    . acetaminophen (TYLENOL) tablet 650 mg  650 mg Oral Q6H PRN Elgergawy, Silver Huguenin, MD   650 mg at 07/11/18 5462   Or  . acetaminophen (TYLENOL) suppository 650 mg  650 mg Rectal Q6H PRN Elgergawy, Silver Huguenin, MD      . atorvastatin (LIPITOR) tablet 10 mg  10 mg Oral Daily Elgergawy, Silver Huguenin, MD   10 mg at 07/11/18 0914  . calcium carbonate (TUMS - dosed in mg elemental calcium) chewable tablet 400 mg of elemental calcium  2 tablet Oral BID WC Elgergawy, Silver Huguenin, MD   400 mg of elemental calcium at 07/11/18 0800  .  cefTRIAXone (ROCEPHIN) 1 g in sodium chloride 0.9 % 100 mL IVPB  1 g Intravenous Q24H Elgergawy, Silver Huguenin, MD   Stopped at 07/10/18 1751  . Chlorhexidine Gluconate Cloth 2 % PADS 6 each  6 each Topical Daily Elgergawy, Silver Huguenin, MD   6 each at 07/11/18 0950  . levothyroxine (SYNTHROID) tablet 75 mcg  75 mcg Oral Daily Elgergawy, Silver Huguenin, MD   75 mcg at 07/11/18 0525  . loratadine (CLARITIN) tablet 10 mg  10 mg Oral Daily Elgergawy, Silver Huguenin, MD   10 mg at 07/11/18 0914  . montelukast (SINGULAIR) tablet 10 mg  10 mg Oral QHS Elgergawy, Silver Huguenin, MD   10 mg at 07/10/18 2135  . pantoprazole (PROTONIX) 80 mg in sodium chloride 0.9 % 250 mL (0.32 mg/mL) infusion  8 mg/hr Intravenous Continuous Elgergawy, Silver Huguenin, MD 25 mL/hr at 07/11/18 0932 8 mg/hr at 07/11/18 0932  . [START ON 07/14/2018] pantoprazole (PROTONIX) injection 40 mg  40 mg Intravenous Q12H Elgergawy, Silver Huguenin, MD        Allergies as of 07/10/2018 - Review Complete 07/10/2018  Allergen Reaction Noted  . Iohexol Hives 11/03/2007    Family History  Problem Relation Age of Onset  . Asthma Mother   . Rheum arthritis Mother   . Non-Hodgkin's  lymphoma Mother   . Colon cancer Father   . Multiple myeloma Father   . Allergies Other        entire family(mom and dad)    Social History   Socioeconomic History  . Marital status: Married    Spouse name: Not on file  . Number of children: Not on file  . Years of education: Not on file  . Highest education level: Not on file  Occupational History  . Occupation: disability  Social Needs  . Financial resource strain: Not very hard  . Food insecurity:    Worry: Never true    Inability: Never true  . Transportation needs:    Medical: No    Non-medical: No  Tobacco Use  . Smoking status: Former Smoker    Packs/day: 1.00    Years: 5.00    Pack years: 5.00    Last attempt to quit: 02/09/2002    Years since quitting: 16.4  . Smokeless tobacco: Never Used  Substance and Sexual Activity  . Alcohol use: No  . Drug use: No  . Sexual activity: Yes  Lifestyle  . Physical activity:    Days per week: Patient refused    Minutes per session: Patient refused  . Stress: Rather much  Relationships  . Social connections:    Talks on phone: Patient refused    Gets together: Patient refused    Attends religious service: Patient refused    Active member of club or organization: Patient refused    Attends meetings of clubs or organizations: Patient refused    Relationship status: Patient refused  . Intimate partner violence:    Fear of current or ex partner: No    Emotionally abused: No    Physically abused: No    Forced sexual activity: No  Other Topics Concern  . Not on file  Social History Narrative   Step brother-2   Step sister-2   Half sister -1 healthy    Review of Systems: Gen: Denies fever, chills, loss of appetite, change in weight or weight loss CV: Denies chest pain, heart palpitations, syncope, edema  Resp: +SOB at rest, cough GI: Denies dysphagia or odynophagia.  Denies vomiting blood, jaundice, and fecal incontinence.  GU : Denies urinary burning, urinary  frequency, urinary incontinence.  MS: Denies joint pain,swelling, cramping Derm: Denies rash, itching, dry skin Psych: +grief, father's funeral today Heme: Denies bruising, bleeding, and enlarged lymph nodes.  Physical Exam: Vital signs in last 24 hours: Temp:  [97.6 F (36.4 C)-98.8 F (37.1 C)] 98.7 F (37.1 C) (05/30 0900) Pulse Rate:  [66-104] 79 (05/30 0930) Resp:  [14-30] 26 (05/30 0900) BP: (90-129)/(43-92) 122/60 (05/30 0900) SpO2:  [72 %-99 %] 95 % (05/30 0930) Weight:  [99.7 kg] 99.7 kg (05/29 1154) Last BM Date: 07/03/18 General:   Alert,  Well-developed, well-nourished, pleasant and cooperative  Head:  Normocephalic and atraumatic. Eyes:  Sclera clear, no icterus.  Ears:  Normal auditory acuity. Nose:  No deformity, discharge,  or lesions. Lungs:  Diminished bases, bilateral expiratory wheeze Heart:  S1 S2 present without murmurs  Abdomen:  Obese, large AP diameter, distended but soft, no rebound or guarding, +BS.  Rectal:  Deferred until time of colonoscopy.   Msk:  Symmetrical without gross deformities. Normal posture. Extremities:  With pedal edema Neurologic:  Alert and  oriented x4;  grossly normal neurologically. Psych:  Alert and cooperative. Normal mood and affect, brief episode of tears when discussing father's funeral today.   Intake/Output from previous day: 05/29 0701 - 05/30 0700 In: 5167.8 [P.O.:3370; I.V.:1615.4; IV Piggyback:182.4] Out: 12197 [Urine:10680] Intake/Output this shift: Total I/O In: 1331.3 [P.O.:1000; I.V.:331.3] Out: 5883 [Urine:3375]  Lab Results: Recent Labs    07/10/18 1215 07/10/18 2223 07/11/18 0422  WBC 20.9* 20.1* 20.2*  HGB 8.4* 8.3* 7.9*  HCT 26.6* 27.1* 25.5*  PLT 308 343 348   BMET Recent Labs    07/10/18 1215 07/10/18 2223 07/11/18 0422  NA 137 142 141  K 3.1* 3.4* 3.8  CL 107 114* 113*  CO2 21* 19* 19*  GLUCOSE 82 124* 109*  BUN 22* 16 13  CREATININE 1.83* 1.56* 1.43*  CALCIUM 8.7* 8.7* 8.7*    LFT Recent Labs    07/10/18 1215 07/10/18 2223 07/11/18 0422  PROT 6.5 6.4* 6.1*  ALBUMIN 3.0* 2.9* 2.7*  AST '21 22 18  ' ALT '19 19 19  ' ALKPHOS 170* 177* 176*  BILITOT 0.5 0.5 0.5    Studies/Results: Ct Head Wo Contrast  Result Date: 07/10/2018 CLINICAL DATA:  Altered mental status. Right-sided weakness and slurred speech. EXAM: CT HEAD WITHOUT CONTRAST TECHNIQUE: Contiguous axial images were obtained from the base of the skull through the vertex without intravenous contrast. COMPARISON:  None. FINDINGS: Brain: There is no evidence of acute infarct, intracranial hemorrhage, mass, midline shift, or extra-axial fluid collection. The ventricles and sulci are normal. Vascular: No hyperdense vessel. Skull: No fracture or focal osseous lesion. Sinuses/Orbits: Small right maxillary sinus. No evidence of acute sinus inflammation. Unremarkable orbits. Other: None. IMPRESSION: Unremarkable CT appearance of the brain. Electronically Signed   By: Logan Bores M.D.   On: 07/10/2018 14:57   Dg Chest Port 1 View  Result Date: 07/10/2018 CLINICAL DATA:  Confusion with hallucinations EXAM: PORTABLE CHEST 1 VIEW COMPARISON:  Jul 08, 2018 FINDINGS: There is no appreciable edema or consolidation. Heart is mildly enlarged with pulmonary vascularity normal. No adenopathy. There is postoperative change in the lower cervical region. IMPRESSION: Mild cardiomegaly.  No edema or consolidation. Electronically Signed   By: Lowella Grip III M.D.   On: 07/10/2018 12:31   Ct Renal Stone Study  Result Date: 07/10/2018 CLINICAL DATA:  Back/flank pain.  EXAM: CT ABDOMEN AND PELVIS WITHOUT CONTRAST TECHNIQUE: Multidetector CT imaging of the abdomen and pelvis was performed following the standard protocol without IV contrast. COMPARISON:  08/05/2011 FINDINGS: Lower chest: Motion artifact in the lung bases with interlobular septal thickening and ground-glass opacity bilaterally. Mild bibasilar atelectasis. No pleural  effusion. Hepatobiliary: No focal liver abnormality is seen. No gallstones, gallbladder wall thickening, or biliary dilatation. Pancreas: Unremarkable. Spleen: Unremarkable. Adrenals/Urinary Tract: Unremarkable adrenal glands. New 11 mm calculus in the interpolar left kidney. Punctate right renal calculi on the prior CT are no longer visualized. Mild bilateral hydroureteronephrosis, left greater than right, with bilateral perinephric and periureteral stranding. No ureteral calculi. Moderately distended bladder. Stomach/Bowel: The stomach is unremarkable. There is no evidence of bowel obstruction. Mild left-sided colonic diverticulosis is noted without evidence of acute diverticulitis. The appendix is unremarkable. Vascular/Lymphatic: Normal caliber of the abdominal aorta. No enlarged lymph nodes. Reproductive: Chronic nodular appearance of the uterus suggesting underlying fibroids. No adnexal mass. Other: No ascites or pneumoperitoneum. Musculoskeletal: Chronic advanced lower lumbar disc degeneration involving the last fully formed disc space. IMPRESSION: 1. Mild bilateral hydroureteronephrosis and perinephric stranding with moderate bladder distension and no obstructing stone identified. Correlate for signs of upper tract infection. 2. New 11 mm left renal calculus. 3. Ground-glass opacity and interlobular septal thickening in the lung bases suggesting edema. Electronically Signed   By: Logan Bores M.D.   On: 07/10/2018 15:07    Impression: 48 year old female admitted with acute pyelonephritis, urinary retention,  lithium toxicity in setting of acute on chronic kidney injury, found to have normocytic anemia with heme positive stool. Anemia panel with normal ferritin, low iron. No known history of anemia per patient. She is without any concerning upper or lower GI signs/symptoms, no melena, hematochezia, hematemesis. Chronic constipation at baseline. Endorses use of Advil intermittently and Goody powder  yesterday; she denies excessive use to me but there is concern for heavy use per admitting H&P.   Now with shortness of breath, cough, and marked water intake overnight. Fluid restriction now in place. Not a candidate for endoscopy at this time.   Would consider EGD prior to discharge once improved from acute illness. Anemia multifactorial in this setting. Would recommend colonoscopy as outpatient in an elective setting.   Constipation: will start Miralax twice a day for now. Anticipate she may need prescriptive agents such as Linzess or Amitiza. Will reassess tomorrow.   Plan: Discontinue PPI infusion: start PPI BID Miralax BID Follow H/H Will reassess in the morning Consider EGD prior to discharge with outpatient colonoscopy for surveillance   Annitta Needs, PhD, ANP-BC [facility]     LOS: 1 day    07/11/2018, 11:13 AM

## 2018-07-11 NOTE — Progress Notes (Signed)
Pt requesting another pitcher of water, had a total of 7 pitchers last night with urine output of 7000 mL for shift. Upon assessment pt's lung sounds are diminished and pt c/o SOB at this time. Placed on 0.5L [place] for comfort. 2000 mL urine output emptied from foley at this time. MD Emokpae notified.

## 2018-07-11 NOTE — Progress Notes (Signed)
PT SOB with speech and exertion. PT denies chest pain. Auscultated expiratory wheeze in upper lung fields and diminished, fine crackles in the lung bases. MD ordered IVF to be reduced from 141ml/hr to 89mL/r. PT has foley catheter that is patent and urine output is nearly 1500 mL during assessment. Foley contents emptied and output charted in EMR. Continue to monitor.

## 2018-07-11 NOTE — Progress Notes (Signed)
PT extremely thirsty with elevated urine output. CBG obtained and 79. Continue to monitor.

## 2018-07-11 NOTE — Progress Notes (Signed)
MD notified pt is requesting Xanax at this time. Per home med list pt has 1mg  Xanax TID PRN, states she takes it every morning and every night.

## 2018-07-12 ENCOUNTER — Telehealth: Payer: Self-pay | Admitting: Gastroenterology

## 2018-07-12 DIAGNOSIS — D509 Iron deficiency anemia, unspecified: Secondary | ICD-10-CM

## 2018-07-12 LAB — CBC
HCT: 28 % — ABNORMAL LOW (ref 36.0–46.0)
Hemoglobin: 8.4 g/dL — ABNORMAL LOW (ref 12.0–15.0)
MCH: 28.2 pg (ref 26.0–34.0)
MCHC: 30 g/dL (ref 30.0–36.0)
MCV: 94 fL (ref 80.0–100.0)
Platelets: 474 10*3/uL — ABNORMAL HIGH (ref 150–400)
RBC: 2.98 MIL/uL — ABNORMAL LOW (ref 3.87–5.11)
RDW: 14.4 % (ref 11.5–15.5)
WBC: 16.6 10*3/uL — ABNORMAL HIGH (ref 4.0–10.5)
nRBC: 0 % (ref 0.0–0.2)

## 2018-07-12 LAB — BASIC METABOLIC PANEL
Anion gap: 14 (ref 5–15)
BUN: 9 mg/dL (ref 6–20)
CO2: 17 mmol/L — ABNORMAL LOW (ref 22–32)
Calcium: 9.4 mg/dL (ref 8.9–10.3)
Chloride: 116 mmol/L — ABNORMAL HIGH (ref 98–111)
Creatinine, Ser: 1.1 mg/dL — ABNORMAL HIGH (ref 0.44–1.00)
GFR calc Af Amer: >60 mL/min (ref >60)
GFR calc non Af Amer: 59 mL/min — ABNORMAL LOW (ref >60)
Glucose, Bld: 112 mg/dL — ABNORMAL HIGH (ref 70–99)
Potassium: 3.2 mmol/L — ABNORMAL LOW (ref 3.5–5.1)
Sodium: 147 mmol/L — ABNORMAL HIGH (ref 135–145)

## 2018-07-12 MED ORDER — PANTOPRAZOLE SODIUM 40 MG PO TBEC: 40.0000 mg | DELAYED_RELEASE_TABLET | Freq: Two times a day (BID) | ORAL | 1 refills | Status: DC

## 2018-07-12 MED ORDER — ACETAMINOPHEN 325 MG PO TABS: 650.0000 mg | ORAL_TABLET | Freq: Four times a day (QID) | ORAL | 1 refills | Status: DC | PRN

## 2018-07-12 MED ORDER — POLYETHYLENE GLYCOL 3350 17 G PO PACK: 17.0000 g | PACK | Freq: Two times a day (BID) | ORAL | 1 refills | Status: DC

## 2018-07-12 MED ORDER — CEFDINIR 300 MG PO CAPS: 300.0000 mg | ORAL_CAPSULE | Freq: Two times a day (BID) | ORAL | 0 refills | Status: AC

## 2018-07-12 MED ORDER — LITHIUM CARBONATE 300 MG PO TABS: 1200.0000 mg | ORAL_TABLET | Freq: Every day | ORAL | 5 refills | Status: DC

## 2018-07-12 NOTE — Progress Notes (Signed)
Being discharged to home. Due to being Sunday not able to schedule EGD and colonoscopy. Did give telephone number to patient and information to schedule appointment tomorrow with Dr. Gabriel Carina. Gave all paperwork and discharge information. Removed IV access with difficulty. Patient did void and urinate today.

## 2018-07-12 NOTE — H&P (View-Only) (Signed)
Subjective: Shortness of breath resolved. No abdominal pain. +flatus. No N/V. No confusion. Thirsty and hungry. Was made NPO this morning until GI could see.   Objective: Vital signs in last 24 hours: Temp:  [97.7 F (36.5 C)-99.6 F (37.6 C)] 99.6 F (37.6 C) (05/31 0813) Pulse Rate:  [57-78] 69 (05/31 0900) Resp:  [14-27] 16 (05/31 0900) BP: (106-140)/(57-90) 127/71 (05/31 0900) SpO2:  [93 %-99 %] 99 % (05/31 0900) Last BM Date: 07/03/18 General:   Alert and oriented, pleasant Lungs: Clear to auscultation bilaterally, without wheezing Abdomen:  Bowel sounds present, soft, non-tender, non-distended. No HSM or hernias noted. No rebound or guarding. No masses appreciated  Neurologic:  Alert and  oriented x4 Psych:  Alert and cooperative. Normal mood and affect.  Intake/Output from previous day: 05/30 0701 - 05/31 0700 In: 2900.8 [P.O.:1855; I.V.:945.8; IV Piggyback:100] Out: 50539 [Urine:10505] Intake/Output this shift: Total I/O In: 70.6 [I.V.:70.6] Out: -   Lab Results: Recent Labs    07/10/18 2223 07/11/18 0422 07/12/18 0410  WBC 20.1* 20.2* 16.6*  HGB 8.3* 7.9* 8.4*  HCT 27.1* 25.5* 28.0*  PLT 343 348 474*   BMET Recent Labs    07/10/18 2223 07/11/18 0422 07/12/18 0410  NA 142 141 147*  K 3.4* 3.8 3.2*  CL 114* 113* 116*  CO2 19* 19* 17*  GLUCOSE 124* 109* 112*  BUN 16 13 9   CREATININE 1.56* 1.43* 1.10*  CALCIUM 8.7* 8.7* 9.4   LFT Recent Labs    07/10/18 1215 07/10/18 2223 07/11/18 0422  PROT 6.5 6.4* 6.1*  ALBUMIN 3.0* 2.9* 2.7*  AST 21 22 18   ALT 19 19 19   ALKPHOS 170* 177* 176*  BILITOT 0.5 0.5 0.5     Studies/Results: Ct Head Wo Contrast  Result Date: 07/10/2018 CLINICAL DATA:  Altered mental status. Right-sided weakness and slurred speech. EXAM: CT HEAD WITHOUT CONTRAST TECHNIQUE: Contiguous axial images were obtained from the base of the skull through the vertex without intravenous contrast. COMPARISON:  None. FINDINGS: Brain:  There is no evidence of acute infarct, intracranial hemorrhage, mass, midline shift, or extra-axial fluid collection. The ventricles and sulci are normal. Vascular: No hyperdense vessel. Skull: No fracture or focal osseous lesion. Sinuses/Orbits: Small right maxillary sinus. No evidence of acute sinus inflammation. Unremarkable orbits. Other: None. IMPRESSION: Unremarkable CT appearance of the brain. Electronically Signed   By: Logan Bores M.D.   On: 07/10/2018 14:57   Dg Chest Port 1 View  Result Date: 07/10/2018 CLINICAL DATA:  Confusion with hallucinations EXAM: PORTABLE CHEST 1 VIEW COMPARISON:  Jul 08, 2018 FINDINGS: There is no appreciable edema or consolidation. Heart is mildly enlarged with pulmonary vascularity normal. No adenopathy. There is postoperative change in the lower cervical region. IMPRESSION: Mild cardiomegaly.  No edema or consolidation. Electronically Signed   By: Lowella Grip III M.D.   On: 07/10/2018 12:31   Ct Renal Stone Study  Result Date: 07/10/2018 CLINICAL DATA:  Back/flank pain. EXAM: CT ABDOMEN AND PELVIS WITHOUT CONTRAST TECHNIQUE: Multidetector CT imaging of the abdomen and pelvis was performed following the standard protocol without IV contrast. COMPARISON:  08/05/2011 FINDINGS: Lower chest: Motion artifact in the lung bases with interlobular septal thickening and ground-glass opacity bilaterally. Mild bibasilar atelectasis. No pleural effusion. Hepatobiliary: No focal liver abnormality is seen. No gallstones, gallbladder wall thickening, or biliary dilatation. Pancreas: Unremarkable. Spleen: Unremarkable. Adrenals/Urinary Tract: Unremarkable adrenal glands. New 11 mm calculus in the interpolar left kidney. Punctate right renal calculi on the prior  CT are no longer visualized. Mild bilateral hydroureteronephrosis, left greater than right, with bilateral perinephric and periureteral stranding. No ureteral calculi. Moderately distended bladder. Stomach/Bowel: The  stomach is unremarkable. There is no evidence of bowel obstruction. Mild left-sided colonic diverticulosis is noted without evidence of acute diverticulitis. The appendix is unremarkable. Vascular/Lymphatic: Normal caliber of the abdominal aorta. No enlarged lymph nodes. Reproductive: Chronic nodular appearance of the uterus suggesting underlying fibroids. No adnexal mass. Other: No ascites or pneumoperitoneum. Musculoskeletal: Chronic advanced lower lumbar disc degeneration involving the last fully formed disc space. IMPRESSION: 1. Mild bilateral hydroureteronephrosis and perinephric stranding with moderate bladder distension and no obstructing stone identified. Correlate for signs of upper tract infection. 2. New 11 mm left renal calculus. 3. Ground-glass opacity and interlobular septal thickening in the lung bases suggesting edema. Electronically Signed   By: Logan Bores M.D.   On: 07/10/2018 15:07    Assessment/Plan: 48 year old female admitted with acute pyelonephritis, urinary retention, lithium toxicity in setting of acute on chronic kidney injury, found to have normocytic anemia with heme positive stool. Anemia panel with normal ferritin, low iron. No known history of anemia per patient. Endorses NSAID use as outpatient. As of note, personal history of polyps and family history of colon cancer in father. Last colonoscopy approximately 5 years ago by Dr. Britta Mccreedy. EGD in remote past.   Shortness of breath resolved. Clinically improved from admission. She is appropriate for discharge home today. Discussed with Dr. Denton Brick and Dr. Gala Romney.   Patient is stable from a GI standpoint, but she will need colonoscopy/EGD as outpatient. We will arrange close interval follow-up with endoscopic procedures as outpatient.   Constipation: continue Miralax BID.   Annitta Needs, PhD, ANP-BC [facility]     LOS: 2 days    07/12/2018, 10:41 AM

## 2018-07-12 NOTE — Discharge Summary (Signed)
Kristen Kristen [name], is a 48 y.o. Kristen [name]  DOB 03/14/1970  MRN [id number].  Admission date:  07/10/2018  Admitting Physician  Albertine Patricia, MD  Discharge Date:  07/12/2018   Primary MD  Manon Hilding, MD  Recommendations for primary care physician for things to follow:   1)STOP Ibuprofen due to Kidney and Stomach concerns 2) lithium has been reduced to-Take  4 Tablets (300mg  x 4)= 1,200 mg every bedtime 3)Avoid ibuprofen/Advil/Aleve/Motrin/Goody Powders/Naproxen/BC powders/Meloxicam/Diclofenac/Indomethacin and other Nonsteroidal anti-inflammatory medications as these will make you more likely to bleed and can cause stomach ulcers, can also cause Kidney problems.  4) the gastroenterology office will call you to schedule outpatient EGD and colonoscopy test 5) follow-up with your psychiatrist to review your psychiatric medications including lithium 6) repeat CBC/complete blood count with your primary care physician within a week 7) Omnicef/Cefdinir antibiotic as prescribed for infection in your urine  Admission Diagnosis  Dysuria [R30.0] Flank pain [R10.9] AKI (acute kidney injury) ([place]) [N17.9] Hematest positive stools [R19.5] Lithium toxicity, accidental or unintentional, initial encounter [T56.891A] Anemia, unspecified type [D64.9]  Discharge Diagnosis  Dysuria [R30.0] Flank pain [R10.9] AKI (acute kidney injury) ([place]) [N17.9] Hematest positive stools [R19.5] Lithium toxicity, accidental or unintentional, initial encounter [T56.891A] Anemia, unspecified type [D64.9]    Principal Problem:   Lithium toxicity Active Problems:   AKI (acute kidney injury) ([place])   Anemia   GAD (generalized anxiety disorder)   Bipolar I disorder ([place])   Encephalopathy   Acute urinary retention      Past Medical History:  Diagnosis Date   Anxiety    Arthritis    Bipolar disorder ([place])    Depression     GERD (gastroesophageal reflux disease)    takes tums daily   Hyperlipemia    Mental disorder    bipolar   PONV (postoperative nausea and vomiting)    Stones in the urinary tract     Past Surgical History:  Procedure Laterality Date   ANTERIOR CERVICAL DECOMP/DISCECTOMY FUSION  02/17/2012   Procedure: ANTERIOR CERVICAL DECOMPRESSION/DISCECTOMY FUSION 2 LEVELS;  Surgeon: Hosie Spangle, MD;  Location: [facility];  Service: Neurosurgery;  Laterality: N/A;  Cervical four-five and Cervical six-seven anterior cervial decompression with fusion plating and bonegraft   BREAST SURGERY  95   breast reduction    CERVICAL SPINE SURGERY  04   CESAREAN SECTION     x2   COLONOSCOPY     remote past, ?2015, Dr. Britta Mccreedy. Personal history of polyps   DIAGNOSTIC LAPAROSCOPY     staph infection    ENDOMETRIAL ABLATION     ESOPHAGOGASTRODUODENOSCOPY     20 years ago.    NASAL SINUS SURGERY     POSTERIOR CERVICAL FUSION/FORAMINOTOMY N/A 08/18/2013   Procedure: CERVICAL FOUR TO CERVICAL SEVEN POSTERIOR CERVICAL FUSION/FORAMINOTOMY LEVEL 3;  Surgeon: Hosie Spangle, MD;  Location: [facility];  Service: Neurosurgery;  Laterality: N/A;  C4-7 posterior cervical fusion with lateral mass fixation   TUBAL LIGATION  HPI  from the history and physical done on the day of admission:     Kristen Kristen [name]  is a 48 y.o. Kristen [name], with past medical history of anxiety, bipolar disorder, depression, GERD, hyperlipidemia, kidney stones, who presents to ED today secondary to confusion, and had recent ED visit 2 days ago, she was diagnosed with pyelonephritis, started on p.o. antibiotics, then she had normal lithium level, mild elevation in creatinine, today she reports she is more confused, feeling foggy, weak, and father passed away last week, the ED her work-up was significant for elevated lithium level at 1.7, normal Tylenol and salicylate level, creatinine elevated at 1.6, CT imaging significant for  bilateral hydronephrosis and distended bladder, patient denies any suicidal thoughts or ideations, reports she is taking her medication as prescribed, she is chronically on this dose of lithium for last few years, she had 2.2 urine output after Foley catheter insertion, was started on Rocephin for her pyelonephritis, patient was noted to have hemoglobin of 8.1, her Hemoccult was positive, she endorses using significant amount of ibuprofen, and Goody powders, and I was called to admit.    Hospital Course:      Brief Summary 48 year old Kristen [name] with PMHx of anxiety, bipolar disorder, depression, GERD, hyperlipidemia, kidney stones admitted on 07/10/18 with acute pyelonephritis, urinary retention, lithium toxicity in setting of acute on chronic kidney injury, found to have normocytic anemia with heme positive stool. Anemia panel with normal ferritin, low iron. No known history of anemia per patient.   A/p 1)Lithium Toxicity--- polyuria and polydipsia has resolved, serum lithium is down to 1.0, discussed with Dr. Frann Rider, okay to restart lithium on 07/12/2018,  lithium has been reduced to-Take  4 Tablets (300mg  x 4)= 1,200 mg every bedtime... follow- up with psychiatrist for adjustment  2)Presumed Pyelonephritis with mild bilateral hydro-ureter nephrosis--- patient is voiding well---  treated with IV Rocephin , urine culture appears contaminated with multiple organisms... Okay to discharge on Omnicef, WBC trending down  3)AKI----Vs progression of CKD -- creatinine on admission= 3,   baseline creatinine =   no recent creatinine available (previously normal in 2015), creatinine is now=1.1 , renally adjust medications, avoid nephrotoxic agents/dehydration/hypotension-- Suspect UTI/pyelonephritis and NSAID use contributing to worsening renal function, hydrate gently. curbside consultation with Dr. Marval Regal from nephrology service--- overall renal function has improved significantly  4)Acute Versus  acute on chronic Anemia--- suspect acute blood loss anemia, last available hemoglobin was 12.6 in June 2015,, admission hemoglobin 8.4 on 07/10/2018, hemoglobin was 10.5 on 07/08/2018, hemoglobin remained stable at 8.4.  there is Hemoccult positive--GI plans to do EGD and colonoscopy as outpatient... Patient did not require transfusion patient had a brown BM without blood prior to discharge on 07/12/2018  5)GAD/Bipolar--denying suicidal ideation or plan, she usually follows with [facility] in [place]-- lithium has been reduced to-Take  4 Tablets (300mg  x 4)= 1,200 mg every bedtime... follow- up with psychiatrist for adjustment  6)Acute toxic metabolic encephalopathy----it was most likely due to poor renal clearance of a psychiatric medications with worsening of function, compounded by UTI and dehydration.----Cognitive and mental function back to baseline, encephalopathy has resolved  Code Status : Full  Family Communication:   na  Disposition Plan  : home  Consults  :  Gi/curbside consultation with Dr. Marval Regal from nephrology service   Discharge Condition: stable  Follow UP--- GI for EGD and colonoscopy as outpatient, PCP for recheck CBC within a week,... Psychiatrist for adjustment of psychiatric medications  Diet and Activity recommendation:  As  advised  Discharge Instructions     Discharge Instructions    Call MD for:  difficulty breathing, headache or visual disturbances   Complete by:  As directed    Call MD for:  persistant dizziness or light-headedness   Complete by:  As directed    Call MD for:  persistant nausea and vomiting   Complete by:  As directed    Call MD for:  temperature >100.4   Complete by:  As directed    Diet - low sodium heart healthy   Complete by:  As directed    Discharge instructions   Complete by:  As directed    1)STOP Ibuprofen due to Kidney and Stomach concerns 2) lithium has been reduced to-Take  4 Tablets (300mg  x 4)= 1,200  mg every bedtime 3)Avoid ibuprofen/Advil/Aleve/Motrin/Goody Powders/Naproxen/BC powders/Meloxicam/Diclofenac/Indomethacin and other Nonsteroidal anti-inflammatory medications as these will make you more likely to bleed and can cause stomach ulcers, can also cause Kidney problems.  4) the gastroenterology office will call you to schedule outpatient EGD and colonoscopy test 5) follow-up with your psychiatrist to review your psychiatric medications including lithium 6) repeat CBC/complete blood count with your primary care physician within a week 7) Omnicef/Cefdinir antibiotic as prescribed for infection in your urine   Increase activity slowly   Complete by:  As directed         Discharge Medications     Allergies as of 07/12/2018      Reactions   Iohexol Hives    Desc: IVP DYE      Medication List    STOP taking these medications   cephALEXin 500 MG capsule Commonly known as:  KEFLEX   ibuprofen 200 MG tablet Commonly known as:  ADVIL   ibuprofen 600 MG tablet Commonly known as:  ADVIL     TAKE these medications   acetaminophen 325 MG tablet Commonly known as:  TYLENOL Take 2 tablets (650 mg total) by mouth every 6 (six) hours as needed for mild pain (or Fever >/= 101).   ALPRAZolam 1 MG tablet Commonly known as:  XANAX Take 1 tablet (1 mg total) by mouth 3 (three) times daily as needed. for anxiety   atorvastatin 10 MG tablet Commonly known as:  LIPITOR Take 10 mg by mouth daily.   buPROPion 150 MG 24 hr tablet Commonly known as:  WELLBUTRIN XL TAKE 3 TABLETS BY MOUTH IN THE MORNING   calcium carbonate 500 MG chewable tablet Commonly known as:  TUMS - dosed in mg elemental calcium Chew 2 tablets by mouth 2 (two) times daily with a meal.   cefdinir 300 MG capsule Commonly known as:  OMNICEF Take 1 capsule (300 mg total) by mouth 2 (two) times daily for 5 days.   cetirizine 10 MG tablet Commonly known as:  ZYRTEC Take 10 mg by mouth daily.     HYDROcodone-acetaminophen 5-325 MG tablet Commonly known as:  NORCO/VICODIN Take 1 tablet by mouth every 4 (four) hours as needed.   lamoTRIgine 200 MG tablet Commonly known as:  LAMICTAL Take 2.5 tablets (500 mg total) by mouth daily.   levothyroxine 75 MCG tablet Commonly known as:  SYNTHROID Take 75 mcg by mouth daily.   lithium 300 MG tablet Take 4 tablets (1,200 mg total) by mouth at bedtime. Take  4 Tablets (300mg  x 4)= 1,200 mg every bedtime What changed:    how much to take  additional instructions   modafinil 200 MG tablet Commonly known as:  PROVIGIL TAKE  1 TABLET BY MOUTH ONCE DAILY   montelukast 10 MG tablet Commonly known as:  SINGULAIR Take 10 mg by mouth at bedtime.   ondansetron 4 MG disintegrating tablet Commonly known as:  Zofran ODT Take 1 tablet (4 mg total) by mouth every 8 (eight) hours as needed.   pantoprazole 40 MG tablet Commonly known as:  PROTONIX Take 1 tablet (40 mg total) by mouth 2 (two) times daily before a meal.   polyethylene glycol 17 g packet Commonly known as:  MIRALAX / GLYCOLAX Take 17 g by mouth 2 (two) times daily.   pramipexole 1 MG tablet Commonly known as:  Mirapex Take 2 tablets (2 mg total) by mouth at bedtime.   propranolol 20 MG tablet Commonly known as:  INDERAL Take 1 tablet (20 mg total) by mouth at bedtime.   QUEtiapine 300 MG 24 hr tablet Commonly known as:  SEROquel XR Take 1 tablet (300 mg total) by mouth at bedtime.       Major procedures and Radiology Reports - PLEASE review detailed and final reports for all details, in brief -    Ct Head Wo Contrast  Result Date: 07/10/2018 CLINICAL DATA:  Altered mental status. Right-sided weakness and slurred speech. EXAM: CT HEAD WITHOUT CONTRAST TECHNIQUE: Contiguous axial images were obtained from the base of the skull through the vertex without intravenous contrast. COMPARISON:  None. FINDINGS: Brain: There is no evidence of acute infarct, intracranial  hemorrhage, mass, midline shift, or extra-axial fluid collection. The ventricles and sulci are normal. Vascular: No hyperdense vessel. Skull: No fracture or focal osseous lesion. Sinuses/Orbits: Small right maxillary sinus. No evidence of acute sinus inflammation. Unremarkable orbits. Other: None. IMPRESSION: Unremarkable CT appearance of the brain. Electronically Signed   By: Logan Bores M.D.   On: 07/10/2018 14:57   Dg Chest Port 1 View  Result Date: 07/10/2018 CLINICAL DATA:  Confusion with hallucinations EXAM: PORTABLE CHEST 1 VIEW COMPARISON:  Jul 08, 2018 FINDINGS: There is no appreciable edema or consolidation. Heart is mildly enlarged with pulmonary vascularity normal. No adenopathy. There is postoperative change in the lower cervical region. IMPRESSION: Mild cardiomegaly.  No edema or consolidation. Electronically Signed   By: Lowella Grip III M.D.   On: 07/10/2018 12:31   Dg Chest Port 1 View  Result Date: 07/08/2018 CLINICAL DATA:  Shortness of breath, cough, and fever. EXAM: PORTABLE CHEST 1 VIEW COMPARISON:  None. FINDINGS: Heart size is normal. Lung volumes are low. No focal airspace disease is present. IMPRESSION: No active disease. Electronically Signed   By: San Morelle M.D.   On: 07/08/2018 07:58   Ct Renal Stone Study  Result Date: 07/10/2018 CLINICAL DATA:  Back/flank pain. EXAM: CT ABDOMEN AND PELVIS WITHOUT CONTRAST TECHNIQUE: Multidetector CT imaging of the abdomen and pelvis was performed following the standard protocol without IV contrast. COMPARISON:  08/05/2011 FINDINGS: Lower chest: Motion artifact in the lung bases with interlobular septal thickening and ground-glass opacity bilaterally. Mild bibasilar atelectasis. No pleural effusion. Hepatobiliary: No focal liver abnormality is seen. No gallstones, gallbladder wall thickening, or biliary dilatation. Pancreas: Unremarkable. Spleen: Unremarkable. Adrenals/Urinary Tract: Unremarkable adrenal glands. New 11 mm  calculus in the interpolar left kidney. Punctate right renal calculi on the prior CT are no longer visualized. Mild bilateral hydroureteronephrosis, left greater than right, with bilateral perinephric and periureteral stranding. No ureteral calculi. Moderately distended bladder. Stomach/Bowel: The stomach is unremarkable. There is no evidence of bowel obstruction. Mild left-sided colonic diverticulosis is noted without evidence of  acute diverticulitis. The appendix is unremarkable. Vascular/Lymphatic: Normal caliber of the abdominal aorta. No enlarged lymph nodes. Reproductive: Chronic nodular appearance of the uterus suggesting underlying fibroids. No adnexal mass. Other: No ascites or pneumoperitoneum. Musculoskeletal: Chronic advanced lower lumbar disc degeneration involving the last fully formed disc space. IMPRESSION: 1. Mild bilateral hydroureteronephrosis and perinephric stranding with moderate bladder distension and no obstructing stone identified. Correlate for signs of upper tract infection. 2. New 11 mm left renal calculus. 3. Ground-glass opacity and interlobular septal thickening in the lung bases suggesting edema. Electronically Signed   By: Logan Bores M.D.   On: 07/10/2018 15:07    Micro Results   Recent Results (from the past 240 hour(s))  Urine culture     Status: Abnormal   Collection Time: 07/08/18  8:18 AM  Result Value Ref Range Status   Specimen Description   Final    URINE, CLEAN CATCH Performed at [facility], [address], [place], [place] [address]    Special Requests   Final    Normal Performed at [facility], [address] [place], [place] [address]    Culture MULTIPLE SPECIES PRESENT, SUGGEST RECOLLECTION (A)  Final   Report Status 07/09/2018 FINAL  Final  SARS Coronavirus 2 (CEPHEID- Performed in [place] hospital lab), Hosp Order     Status: None   Collection Time: 07/08/18  8:18 AM  Result Value Ref Range Status   SARS Coronavirus 2 NEGATIVE NEGATIVE  Final    Comment: (NOTE) If result is NEGATIVE SARS-CoV-2 target nucleic acids are NOT DETECTED. The SARS-CoV-2 RNA is generally detectable in upper and lower  respiratory specimens during the acute phase of infection. The lowest  concentration of SARS-CoV-2 viral copies this assay can detect is 250  copies / mL. A negative result does not preclude SARS-CoV-2 infection  and should not be used as the sole basis for treatment or other  patient management decisions.  A negative result may occur with  improper specimen collection / handling, submission of specimen other  than nasopharyngeal swab, presence of viral mutation(s) within the  areas targeted by this assay, and inadequate number of viral copies  (<250 copies / mL). A negative result must be combined with clinical  observations, patient history, and epidemiological information. If result is POSITIVE SARS-CoV-2 target nucleic acids are DETECTED. The SARS-CoV-2 RNA is generally detectable in upper and lower  respiratory specimens dur ing the acute phase of infection.  Positive  results are indicative of active infection with SARS-CoV-2.  Clinical  correlation with patient history and other diagnostic information is  necessary to determine patient infection status.  Positive results do  not rule out bacterial infection or co-infection with other viruses. If result is PRESUMPTIVE POSTIVE SARS-CoV-2 nucleic acids MAY BE PRESENT.   A presumptive positive result was obtained on the submitted specimen  and confirmed on repeat testing.  While 2019 novel coronavirus  (SARS-CoV-2) nucleic acids may be present in the submitted sample  additional confirmatory testing may be necessary for epidemiological  and / or clinical management purposes  to differentiate between  SARS-CoV-2 and other Sarbecovirus currently known to infect humans.  If clinically indicated additional testing with an alternate test  methodology [phone]) is advised. The  SARS-CoV-2 RNA is generally  detectable in upper and lower respiratory sp ecimens during the acute  phase of infection. The expected result is Negative. Fact Sheet for Patients:  StrictlyIdeas.no Fact Sheet for Healthcare Providers: BankingDealers.co.za This test is not yet  approved or cleared by the [place] and has been authorized for detection and/or diagnosis of SARS-CoV-2 by FDA under an Emergency Use Authorization (EUA).  This EUA will remain in effect (meaning this test can be used) for the duration of the COVID-19 declaration under Section 564(b)(1) of the Act, 21 U.S.C. section 360bbb-3(b)(1), unless the authorization is terminated or revoked sooner. Performed at [facility], [address] [place], [place] [address]   SARS Coronavirus 2 (CEPHEID- Performed in [facility] hospital lab), Hosp Order     Status: None   Collection Time: 07/10/18 11:58 AM  Result Value Ref Range Status   SARS Coronavirus 2 NEGATIVE NEGATIVE Final    Comment: (NOTE) If result is NEGATIVE SARS-CoV-2 target nucleic acids are NOT DETECTED. The SARS-CoV-2 RNA is generally detectable in upper and lower  respiratory specimens during the acute phase of infection. The lowest  concentration of SARS-CoV-2 viral copies this assay can detect is 250  copies / mL. A negative result does not preclude SARS-CoV-2 infection  and should not be used as the sole basis for treatment or other  patient management decisions.  A negative result may occur with  improper specimen collection / handling, submission of specimen other  than nasopharyngeal swab, presence of viral mutation(s) within the  areas targeted by this assay, and inadequate number of viral copies  (<250 copies / mL). A negative result must be combined with clinical  observations, patient history, and epidemiological information. If result is POSITIVE SARS-CoV-2 target nucleic acids are  DETECTED. The SARS-CoV-2 RNA is generally detectable in upper and lower  respiratory specimens dur ing the acute phase of infection.  Positive  results are indicative of active infection with SARS-CoV-2.  Clinical  correlation with patient history and other diagnostic information is  necessary to determine patient infection status.  Positive results do  not rule out bacterial infection or co-infection with other viruses. If result is PRESUMPTIVE POSTIVE SARS-CoV-2 nucleic acids MAY BE PRESENT.   A presumptive positive result was obtained on the submitted specimen  and confirmed on repeat testing.  While 2019 novel coronavirus  (SARS-CoV-2) nucleic acids may be present in the submitted sample  additional confirmatory testing may be necessary for epidemiological  and / or clinical management purposes  to differentiate between  SARS-CoV-2 and other Sarbecovirus currently known to infect humans.  If clinically indicated additional testing with an alternate test  methodology [phone]) is advised. The SARS-CoV-2 RNA is generally  detectable in upper and lower respiratory sp ecimens during the acute  phase of infection. The expected result is Negative. Fact Sheet for Patients:  StrictlyIdeas.no Fact Sheet for Healthcare Providers: BankingDealers.co.za This test is not yet approved or cleared by the [place] FDA and has been authorized for detection and/or diagnosis of SARS-CoV-2 by FDA under an Emergency Use Authorization (EUA).  This EUA will remain in effect (meaning this test can be used) for the duration of the COVID-19 declaration under Section 564(b)(1) of the Act, 21 U.S.C. section 360bbb-3(b)(1), unless the authorization is terminated or revoked sooner. Performed at [facility], [address], [place], [place] [address]   Urine culture     Status: None   Collection Time: 07/10/18 11:59 AM  Result Value Ref Range Status    Specimen Description   Final    URINE, CLEAN CATCH Performed at [facility], [address], [place], [place] [address]    Special Requests   Final    NONE Performed at  [place] [place], [place] [address]    Culture   Final    NO GROWTH Performed at [facility], [place] [address], [place], [place] [address]    Report Status 07/11/2018 FINAL  Final  Blood Culture (routine x 2)     Status: None (Preliminary result)   Collection Time: 07/10/18 12:29 PM  Result Value Ref Range Status   Specimen Description BLOOD  Final   Special Requests   Final    BOTTLES DRAWN AEROBIC AND ANAEROBIC Blood Culture adequate volume   Culture   Final    NO GROWTH 2 DAYS Performed at [facility], [address], [place], [place] [address]    Report Status PENDING  Incomplete  Blood Culture (routine x 2)     Status: None (Preliminary result)   Collection Time: 07/10/18 12:29 PM  Result Value Ref Range Status   Specimen Description BLOOD LEFT HAND  Final   Special Requests   Final    BOTTLES DRAWN AEROBIC AND ANAEROBIC Blood Culture results may not be optimal due to an inadequate volume of blood received in culture bottles   Culture   Final    NO GROWTH 2 DAYS Performed at [facility], [address] [place], [place] [address]    Report Status PENDING  Incomplete  MRSA PCR Screening     Status: None   Collection Time: 07/10/18  5:40 PM  Result Value Ref Range Status   MRSA by PCR NEGATIVE NEGATIVE Final    Comment:        The GeneXpert MRSA Assay (FDA approved for NASAL specimens only), is one component of a comprehensive MRSA colonization surveillance program. It is not intended to diagnose MRSA infection nor to guide or monitor treatment for MRSA infections. Performed at [facility], [address], [place], [place] [address]        Today   Subjective    [place] today has no new complaints... No fevers no chills no dysuria no flank pain  Eating  and drinking well, no shortness of breath palpitations or dizziness   Patient had a brown BM, eager to go home          Patient has been seen and examined prior to discharge   Objective   Blood pressure 133/72, pulse 69, temperature 99.6 F (37.6 C), temperature source Oral, resp. rate 18, height 5\' 6"  (1.676 m), weight 99.7 kg, SpO2 98 %.   Intake/Output Summary (Last 24 hours) at 07/12/2018 1428 Last data filed at 07/12/2018 0900 Gross per 24 hour  Intake 1168.34 ml  Output 4350 ml  Net -3181.66 ml    Exam Gen:- Awake Alert, no acute distress  HEENT:- [place].AT, No sclera icterus Neck-Supple Neck,No JVD,.  Lungs-  CTAB , good air movement bilaterally  CV- S1, S2 normal, regular Abd-  +ve B.Sounds, Abd Soft, No tenderness, no CVA area tenderness Extremity/Skin:- No  edema,   good pulses Psych-affect is appropriate, oriented x3 Neuro-no new focal deficits, no tremors    Data Review   CBC w Diff:  Lab Results  Component Value Date   WBC 16.6 (H) 07/12/2018   HGB 8.4 (L) 07/12/2018   HCT 28.0 (L) 07/12/2018   PLT 474 (H) 07/12/2018   LYMPHOPCT 6 07/10/2018   MONOPCT 7 07/10/2018   EOSPCT 3 07/10/2018   BASOPCT 0 07/10/2018    CMP:  Lab Results  Component Value Date   NA 147 (H) 07/12/2018  K 3.2 (L) 07/12/2018   CL 116 (H) 07/12/2018   CO2 17 (L) 07/12/2018   BUN 9 07/12/2018   CREATININE 1.10 (H) 07/12/2018   PROT 6.1 (L) 07/11/2018   ALBUMIN 2.7 (L) 07/11/2018   BILITOT 0.5 07/11/2018   ALKPHOS 176 (H) 07/11/2018   AST 18 07/11/2018   ALT 19 07/11/2018  .   Total Discharge time is about 33 minutes  Roxan Hockey M.D on 07/12/2018 at 2:28 PM  Go to [email or web address] -  for contact info  [facility] - Office  [phone]

## 2018-07-12 NOTE — Progress Notes (Signed)
Subjective: Shortness of breath resolved. No abdominal pain. +flatus. No N/V. No confusion. Thirsty and hungry. Was made NPO this morning until GI could see.   Objective: Vital signs in last 24 hours: Temp:  [97.7 F (36.5 C)-99.6 F (37.6 C)] 99.6 F (37.6 C) (05/31 0813) Pulse Rate:  [57-78] 69 (05/31 0900) Resp:  [14-27] 16 (05/31 0900) BP: (106-140)/(57-90) 127/71 (05/31 0900) SpO2:  [93 %-99 %] 99 % (05/31 0900) Last BM Date: 07/03/18 General:   Alert and oriented, pleasant Lungs: Clear to auscultation bilaterally, without wheezing Abdomen:  Bowel sounds present, soft, non-tender, non-distended. No HSM or hernias noted. No rebound or guarding. No masses appreciated  Neurologic:  Alert and  oriented x4 Psych:  Alert and cooperative. Normal mood and affect.  Intake/Output from previous day: 05/30 0701 - 05/31 0700 In: 2900.8 [P.O.:1855; I.V.:945.8; IV Piggyback:100] Out: 73220 [Urine:10505] Intake/Output this shift: Total I/O In: 70.6 [I.V.:70.6] Out: -   Lab Results: Recent Labs    07/10/18 2223 07/11/18 0422 07/12/18 0410  WBC 20.1* 20.2* 16.6*  HGB 8.3* 7.9* 8.4*  HCT 27.1* 25.5* 28.0*  PLT 343 348 474*   BMET Recent Labs    07/10/18 2223 07/11/18 0422 07/12/18 0410  NA 142 141 147*  K 3.4* 3.8 3.2*  CL 114* 113* 116*  CO2 19* 19* 17*  GLUCOSE 124* 109* 112*  BUN 16 13 9   CREATININE 1.56* 1.43* 1.10*  CALCIUM 8.7* 8.7* 9.4   LFT Recent Labs    07/10/18 1215 07/10/18 2223 07/11/18 0422  PROT 6.5 6.4* 6.1*  ALBUMIN 3.0* 2.9* 2.7*  AST 21 22 18   ALT 19 19 19   ALKPHOS 170* 177* 176*  BILITOT 0.5 0.5 0.5     Studies/Results: Ct Head Wo Contrast  Result Date: 07/10/2018 CLINICAL DATA:  Altered mental status. Right-sided weakness and slurred speech. EXAM: CT HEAD WITHOUT CONTRAST TECHNIQUE: Contiguous axial images were obtained from the base of the skull through the vertex without intravenous contrast. COMPARISON:  None. FINDINGS: Brain:  There is no evidence of acute infarct, intracranial hemorrhage, mass, midline shift, or extra-axial fluid collection. The ventricles and sulci are normal. Vascular: No hyperdense vessel. Skull: No fracture or focal osseous lesion. Sinuses/Orbits: Small right maxillary sinus. No evidence of acute sinus inflammation. Unremarkable orbits. Other: None. IMPRESSION: Unremarkable CT appearance of the brain. Electronically Signed   By: Logan Bores M.D.   On: 07/10/2018 14:57   Dg Chest Port 1 View  Result Date: 07/10/2018 CLINICAL DATA:  Confusion with hallucinations EXAM: PORTABLE CHEST 1 VIEW COMPARISON:  Jul 08, 2018 FINDINGS: There is no appreciable edema or consolidation. Heart is mildly enlarged with pulmonary vascularity normal. No adenopathy. There is postoperative change in the lower cervical region. IMPRESSION: Mild cardiomegaly.  No edema or consolidation. Electronically Signed   By: Lowella Grip III M.D.   On: 07/10/2018 12:31   Ct Renal Stone Study  Result Date: 07/10/2018 CLINICAL DATA:  Back/flank pain. EXAM: CT ABDOMEN AND PELVIS WITHOUT CONTRAST TECHNIQUE: Multidetector CT imaging of the abdomen and pelvis was performed following the standard protocol without IV contrast. COMPARISON:  08/05/2011 FINDINGS: Lower chest: Motion artifact in the lung bases with interlobular septal thickening and ground-glass opacity bilaterally. Mild bibasilar atelectasis. No pleural effusion. Hepatobiliary: No focal liver abnormality is seen. No gallstones, gallbladder wall thickening, or biliary dilatation. Pancreas: Unremarkable. Spleen: Unremarkable. Adrenals/Urinary Tract: Unremarkable adrenal glands. New 11 mm calculus in the interpolar left kidney. Punctate right renal calculi on the prior  CT are no longer visualized. Mild bilateral hydroureteronephrosis, left greater than right, with bilateral perinephric and periureteral stranding. No ureteral calculi. Moderately distended bladder. Stomach/Bowel: The  stomach is unremarkable. There is no evidence of bowel obstruction. Mild left-sided colonic diverticulosis is noted without evidence of acute diverticulitis. The appendix is unremarkable. Vascular/Lymphatic: Normal caliber of the abdominal aorta. No enlarged lymph nodes. Reproductive: Chronic nodular appearance of the uterus suggesting underlying fibroids. No adnexal mass. Other: No ascites or pneumoperitoneum. Musculoskeletal: Chronic advanced lower lumbar disc degeneration involving the last fully formed disc space. IMPRESSION: 1. Mild bilateral hydroureteronephrosis and perinephric stranding with moderate bladder distension and no obstructing stone identified. Correlate for signs of upper tract infection. 2. New 11 mm left renal calculus. 3. Ground-glass opacity and interlobular septal thickening in the lung bases suggesting edema. Electronically Signed   By: Logan Bores M.D.   On: 07/10/2018 15:07    Assessment/Plan: 48 year old female admitted with acute pyelonephritis, urinary retention, lithium toxicity in setting of acute on chronic kidney injury, found to have normocytic anemia with heme positive stool. Anemia panel with normal ferritin, low iron. No known history of anemia per patient. Endorses NSAID use as outpatient. As of note, personal history of polyps and family history of colon cancer in father. Last colonoscopy approximately 5 years ago by Dr. Britta Mccreedy. EGD in remote past.   Shortness of breath resolved. Clinically improved from admission. She is appropriate for discharge home today. Discussed with Dr. Denton Brick and Dr. Gala Romney.   Patient is stable from a GI standpoint, but she will need colonoscopy/EGD as outpatient. We will arrange close interval follow-up with endoscopic procedures as outpatient.   Constipation: continue Miralax BID.   Annitta Needs, PhD, ANP-BC [facility]     LOS: 2 days    07/12/2018, 10:41 AM

## 2018-07-12 NOTE — Progress Notes (Signed)
Patient called stated she lost necklace in CT. Called CT, Security, ED and no one has seen necklace.

## 2018-07-12 NOTE — Discharge Instructions (Signed)
1)STOP Ibuprofen due to Kidney and Stomach concerns 2) lithium has been reduced to-Take  4 Tablets (300mg  x 4)= 1,200 mg every bedtime 3)Avoid ibuprofen/Advil/Aleve/Motrin/Goody Powders/Naproxen/BC powders/Meloxicam/Diclofenac/Indomethacin and other Nonsteroidal anti-inflammatory medications as these will make you more likely to bleed and can cause stomach ulcers, can also cause Kidney problems.  4) the gastroenterology office will call you to schedule outpatient EGD and colonoscopy test 5) follow-up with your psychiatrist to review your psychiatric medications including lithium 6) repeat CBC/complete blood count with your primary care physician within a week 7) Omnicef/Cefdinir antibiotic as prescribed for infection in your urine

## 2018-07-12 NOTE — Telephone Encounter (Signed)
[facility] clinical pool: Patient was hospitalized over the weekend and found to have anemia with heme positive stool. She needs outpatient colonoscopy/EGD with Dr. Gala Romney, using Propofol in next few weeks.

## 2018-07-13 ENCOUNTER — Telehealth: Payer: Self-pay | Admitting: Physician Assistant

## 2018-07-13 ENCOUNTER — Other Ambulatory Visit: Payer: Self-pay | Admitting: *Deleted

## 2018-07-13 DIAGNOSIS — R195 Other fecal abnormalities: Secondary | ICD-10-CM

## 2018-07-13 DIAGNOSIS — D509 Iron deficiency anemia, unspecified: Secondary | ICD-10-CM

## 2018-07-13 LAB — OCCULT BLOOD, POC DEVICE: Fecal Occult Bld: POSITIVE — AB

## 2018-07-13 MED ORDER — PEG 3350-KCL-NA BICARB-NACL 420 G PO SOLR: 4000.0000 mL | Freq: Once | ORAL | 0 refills | Status: AC

## 2018-07-13 NOTE — Telephone Encounter (Signed)
Ok, will review records.

## 2018-07-13 NOTE — Telephone Encounter (Signed)
She just got out of the hospital for lithium toxicity from [facility]. Was in the hospital for 4 days. I scheduled her for appt. 6/3.

## 2018-07-13 NOTE — Telephone Encounter (Addendum)
Called patient. She is scheduled for TCS/EGD with propofol with RMR on 07/20/2018 at 10am. Patient aware will send prep to [facility] in [place] and I will also fax her prep instructions to them. Advised her to pick this up within the next day or 2 to ensure she receives prep instructions. She is also aware will need pre-op appt and covid-19 testing. Once tested she will need to stay quarantined until after procedure. She voiced understanding.  Instructions faxed to [facility]. Confirmation received  Called carolyn in endo and LMOVM making aware orders were entered.  PA done via [facility] website> it was approved. The notification/prior authorization reference number is [id number] Dates 07/20/2018-10/18/2018

## 2018-07-14 NOTE — Telephone Encounter (Signed)
Patient has pre-op phone call scheduled for 6/3. pt was discharged from [facility] on 5/31, pt has been quarantined since that time and understands to stay quarantined until procedure, does not need 2nd Covid test per appt notes

## 2018-07-15 ENCOUNTER — Ambulatory Visit (INDEPENDENT_AMBULATORY_CARE_PROVIDER_SITE_OTHER): Payer: 59 | Admitting: Physician Assistant

## 2018-07-15 ENCOUNTER — Encounter (HOSPITAL_COMMUNITY): Payer: Self-pay

## 2018-07-15 ENCOUNTER — Encounter: Payer: Self-pay | Admitting: Physician Assistant

## 2018-07-15 ENCOUNTER — Other Ambulatory Visit: Payer: Self-pay

## 2018-07-15 ENCOUNTER — Encounter (HOSPITAL_COMMUNITY)
Admission: RE | Admit: 2018-07-15 | Discharge: 2018-07-15 | Disposition: A | Discharge status: Home / Self Care | Payer: 59 | Source: Ambulatory Visit | Attending: Internal Medicine | Admitting: Internal Medicine

## 2018-07-15 DIAGNOSIS — F411 Generalized anxiety disorder: Secondary | ICD-10-CM

## 2018-07-15 DIAGNOSIS — F319 Bipolar disorder, unspecified: Secondary | ICD-10-CM | POA: Diagnosis not present

## 2018-07-15 DIAGNOSIS — T56891D Toxic effect of other metals, accidental (unintentional), subsequent encounter: Secondary | ICD-10-CM

## 2018-07-15 LAB — CULTURE, BLOOD (ROUTINE X 2)
Culture: NO GROWTH
Culture: NO GROWTH
Special Requests: ADEQUATE

## 2018-07-15 NOTE — Progress Notes (Signed)
[facility] Check  Patient ID: Kristen [name],  MRN: [id number]  PCP: Manon Hilding, MD  Date of Evaluation: 07/15/2018 Time spent:25 minutes  Chief Complaint:  Chief Complaint    Medication Problem     Virtual Visit via Telephone Note  I connected with patient by a video enabled telemedicine application or telephone, with their informed consent, and verified patient privacy and that I am speaking with the correct person using two identifiers.  I am private, at [place] and the patient is at home.  I discussed the limitations, risks, security and privacy concerns of performing an evaluation and management service by telephone and the availability of in person appointments. I also discussed with the patient that there may be a patient responsible charge related to this service. The patient expressed understanding and agreed to proceed.   I discussed the assessment and treatment plan with the patient. The patient was provided an opportunity to ask questions and all were answered. The patient agreed with the plan and demonstrated an understanding of the instructions.   The patient was advised to call back or seek an in-person evaluation if the symptoms worsen or if the condition fails to improve as anticipated.  I provided 25 minutes of non-face-to-face time during this encounter.  HISTORY/CURRENT STATUS: HPI for follow-up after hospitalization.  Patient has been in the hospital for lithium toxicity since her last visit in April.  It was as high as 1.74 on 07/10/2018.  It slowly went down and at discharge the lithium level was 1.0.  She was in ICU initially, admitted on 5/29/220 and discharged from hospital on 07/12/18.  States she had been taking her lithium as directed.  She has never overtaken it.  States she was sent home on 2000 mg of lithium.  She had been on 2500 mg.  Says she is scared to take it and has not been on it since leaving the hospital.  "I do not ever want to  feel that bad again.  I thought I was going to die."    See notes on chart.  As she has been unable to increase the Seroquel.  States it makes her feel drunk at a higher dose.  Dad passed away 07/21/18.  She was in the hospital on the day of his funeral and unable to go.  She is very sad about that but states she had her own memorial service for him just the 2 of them.  Cousin made a video of the funeral and she was able to watch that which made her feel better.  Other than being tired and weak, she feels fine.  She denies anhedonia, she is isolating but mostly because of the coronavirus pandemic and because she is recovering from the lithium toxicity.  Denies any increased energy with decreased need for sleep.  No increased spending, impulsivity or risky behavior.  No increased libido, grandiosity or other manic behaviors.  Denies dizziness, syncope, seizures, numbness, tingling, tremor, tics, unsteady gait, slurred speech, confusion.  Denies muscle or joint pain, stiffness, or dystonia.  Individual Medical History/ Review of Systems: Changes? :Yes    Past medications for mental health diagnoses include: Prozac, Paxil, Lexapro, Celexa, Zoloft, Viibryd, Effexor, Cymbalta, Risperdal, Latuda, Lamictal, VPA, Li, Xanax, Wellbutrin, Abilify, Traz, Strattera, Kapvay, Saphris, Adderall, Vyvanse, Lunesta, Sonata, CBZ, Vraylar, Seroquel  Allergies: Iohexol  Current Medications:  Current Outpatient Medications:  .  acetaminophen (TYLENOL) 325 MG tablet, Take 2 tablets (650 mg total) by mouth every 6 (  six) hours as needed for mild pain (or Fever >/= 101)., Disp: 12 tablet, Rfl: 1 .  ALPRAZolam (XANAX) 1 MG tablet, Take 1 tablet (1 mg total) by mouth 3 (three) times daily as needed. for anxiety (Patient taking differently: Take 1 mg by mouth 3 (three) times daily as needed for anxiety. ), Disp: 90 tablet, Rfl: 5 .  atorvastatin (LIPITOR) 10 MG tablet, Take 10 mg by mouth daily., Disp: , Rfl: 1 .  buPROPion  (WELLBUTRIN XL) 150 MG 24 hr tablet, TAKE 3 TABLETS BY MOUTH IN THE MORNING (Patient taking differently: Take 150 mg by mouth daily. ), Disp: 90 tablet, Rfl: 5 .  calcium carbonate (TUMS - DOSED IN MG ELEMENTAL CALCIUM) 500 MG chewable tablet, Chew 2 tablets by mouth 2 (two) times daily as needed for indigestion or heartburn. , Disp: , Rfl:  .  cefdinir (OMNICEF) 300 MG capsule, Take 1 capsule (300 mg total) by mouth 2 (two) times daily for 5 days., Disp: 10 capsule, Rfl: 0 .  cetirizine (ZYRTEC) 10 MG tablet, Take 10 mg by mouth daily as needed for allergies. , Disp: , Rfl:  .  fluticasone (FLONASE) 50 MCG/ACT nasal spray, Place 2 sprays into both nostrils daily as needed for allergies or rhinitis., Disp: , Rfl:  .  lamoTRIgine (LAMICTAL) 200 MG tablet, Take 2.5 tablets (500 mg total) by mouth daily., Disp: 75 tablet, Rfl: 5 .  levothyroxine (SYNTHROID) 75 MCG tablet, Take 75 mcg by mouth daily before breakfast. , Disp: , Rfl:  .  modafinil (PROVIGIL) 200 MG tablet, TAKE 1 TABLET BY MOUTH ONCE DAILY (Patient taking differently: Take 200 mg by mouth daily. ), Disp: 30 tablet, Rfl: 5 .  montelukast (SINGULAIR) 10 MG tablet, Take 10 mg by mouth at bedtime. , Disp: , Rfl:  .  pantoprazole (PROTONIX) 40 MG tablet, Take 1 tablet (40 mg total) by mouth 2 (two) times daily before a meal., Disp: 60 tablet, Rfl: 1 .  pramipexole (MIRAPEX) 1 MG tablet, Take 2 tablets (2 mg total) by mouth at bedtime., Disp: 60 tablet, Rfl: 5 .  propranolol (INDERAL) 20 MG tablet, Take 1 tablet (20 mg total) by mouth at bedtime., Disp: 30 tablet, Rfl: 5 .  QUEtiapine Fumarate (SEROQUEL XR) 150 MG 24 hr tablet, Take 150 mg by mouth at bedtime., Disp: , Rfl:  .  HYDROcodone-acetaminophen (NORCO/VICODIN) 5-325 MG tablet, Take 1 tablet by mouth every 4 (four) hours as needed. (Patient not taking: Reported on 07/15/2018), Disp: 10 tablet, Rfl: 0 .  ondansetron (ZOFRAN ODT) 4 MG disintegrating tablet, Take 1 tablet (4 mg total) by mouth  every 8 (eight) hours as needed. (Patient not taking: Reported on 07/15/2018), Disp: 10 tablet, Rfl: 0 .  polyethylene glycol (MIRALAX / GLYCOLAX) 17 g packet, Take 17 g by mouth 2 (two) times daily. (Patient not taking: Reported on 07/15/2018), Disp: 60 each, Rfl: 1 Medication Side Effects: none  Family Medical/ Social History: Changes? No  MENTAL HEALTH EXAM:  There were no vitals taken for this visit.There is no height or weight on file to calculate BMI.  General Appearance: unable to assess  Eye Contact:  unable to assess  Speech:  Clear and Coherent  Volume:  Normal  Mood:  Euthymic  Affect:  unable to assess  Thought Process:  Goal Directed  Orientation:  Full (Time, Place, and Person)  Thought Content: Logical   Suicidal Thoughts:  No  Homicidal Thoughts:  No  Memory:  WNL  Judgement:  Good  Insight:  Good  Psychomotor Activity:  unable to assess  Concentration:  Concentration: Good  Recall:  Good  Fund of Knowledge: Good  Language: Good  Assets:  Desire for Improvement  ADL's:  Intact  Cognition: WNL  Prognosis:  Good  See labs on chart.  DIAGNOSES:    ICD-10-CM   1. Bipolar I disorder ([place]) F31.9   2. Generalized anxiety disorder F41.1   3. Lithium toxicity, accidental or unintentional, subsequent encounter T56.891D     Receiving Psychotherapy: No    RECOMMENDATIONS:  Hospital discharge summary was reviewed.  I can understand her thoughts of not wanting to take lithium for fear of toxicity.  We have been checking lithium levels routinely through Dr. Edythe Lynn office and she has been very compliant.  The lithium levels have always been within normal range.  She is on Seroquel, on a low therapeutic dose but that can treat bipolar disorder.  She is also on Lamictal as the mood stabilizer, and both drugs help depression as well as mania of bipolar disorder.  At this point I think it is fine for [place] to hold off on restarting lithium.  If she starts getting a lot of mood  swings then we will have to restart it as she has tried numerous other medications that either caused side effects or were ineffective.  She verbalizes understanding and is willing to talk about that if the time comes. Continue Lamictal 200 mg, 2.5 pills= 500 mg. Continue Seroquel XR 150 mg daily. Continue Wellbutrin XL 450 mg daily. Continue Xanax 1 mg 3 times daily as needed. Continue propranolol 20 mg nightly. Continue pramipexole 2 mg nightly. Continue modafinil 200 mg every morning. Continue Synthroid 75 mcg every morning. Her TSH will be checked per Dr. Quintin Alto within the next month.  She may not need this Synthroid if not on lithium.  But will follow her labs and see how she does. Return in 4 weeks.  [name], PA-C   This record has been created using [employer].  Chart creation errors have been sought, but may not always have been located and corrected. Such creation errors do not reflect on the standard of medical care.

## 2018-07-20 ENCOUNTER — Encounter (HOSPITAL_COMMUNITY): Payer: Self-pay | Admitting: *Deleted

## 2018-07-20 ENCOUNTER — Ambulatory Visit (HOSPITAL_COMMUNITY): Payer: 59 | Admitting: Anesthesiology

## 2018-07-20 ENCOUNTER — Other Ambulatory Visit: Payer: Self-pay

## 2018-07-20 ENCOUNTER — Ambulatory Visit (HOSPITAL_COMMUNITY)
Admission: RE | Admit: 2018-07-20 | Discharge: 2018-07-20 | Disposition: A | Discharge status: Home / Self Care | Payer: 59 | Attending: Internal Medicine | Admitting: Internal Medicine

## 2018-07-20 ENCOUNTER — Encounter (HOSPITAL_COMMUNITY)
Admission: RE | Disposition: A | Discharge status: Home / Self Care | Payer: Self-pay | Source: Home / Self Care | Attending: Internal Medicine

## 2018-07-20 DIAGNOSIS — D649 Anemia, unspecified: Secondary | ICD-10-CM | POA: Diagnosis not present

## 2018-07-20 DIAGNOSIS — K259 Gastric ulcer, unspecified as acute or chronic, without hemorrhage or perforation: Secondary | ICD-10-CM | POA: Insufficient documentation

## 2018-07-20 DIAGNOSIS — K640 First degree hemorrhoids: Secondary | ICD-10-CM | POA: Insufficient documentation

## 2018-07-20 DIAGNOSIS — K635 Polyp of colon: Secondary | ICD-10-CM

## 2018-07-20 DIAGNOSIS — Z86010 Personal history of colonic polyps: Secondary | ICD-10-CM | POA: Insufficient documentation

## 2018-07-20 DIAGNOSIS — K2950 Unspecified chronic gastritis without bleeding: Secondary | ICD-10-CM | POA: Insufficient documentation

## 2018-07-20 DIAGNOSIS — R195 Other fecal abnormalities: Secondary | ICD-10-CM | POA: Diagnosis present

## 2018-07-20 DIAGNOSIS — D123 Benign neoplasm of transverse colon: Secondary | ICD-10-CM | POA: Insufficient documentation

## 2018-07-20 DIAGNOSIS — Z79899 Other long term (current) drug therapy: Secondary | ICD-10-CM | POA: Diagnosis not present

## 2018-07-20 DIAGNOSIS — D509 Iron deficiency anemia, unspecified: Secondary | ICD-10-CM

## 2018-07-20 DIAGNOSIS — K5730 Diverticulosis of large intestine without perforation or abscess without bleeding: Secondary | ICD-10-CM | POA: Diagnosis not present

## 2018-07-20 DIAGNOSIS — K449 Diaphragmatic hernia without obstruction or gangrene: Secondary | ICD-10-CM | POA: Diagnosis not present

## 2018-07-20 DIAGNOSIS — K3189 Other diseases of stomach and duodenum: Secondary | ICD-10-CM

## 2018-07-20 DIAGNOSIS — K5900 Constipation, unspecified: Secondary | ICD-10-CM | POA: Diagnosis not present

## 2018-07-20 DIAGNOSIS — Z800 Family history of malignant neoplasm of digestive organs: Secondary | ICD-10-CM | POA: Diagnosis not present

## 2018-07-20 HISTORY — PX: POLYPECTOMY: SHX5525

## 2018-07-20 HISTORY — PX: BIOPSY: SHX5522

## 2018-07-20 HISTORY — PX: COLONOSCOPY WITH PROPOFOL: SHX5780

## 2018-07-20 HISTORY — PX: ESOPHAGOGASTRODUODENOSCOPY (EGD) WITH PROPOFOL: SHX5813

## 2018-07-20 SURGERY — COLONOSCOPY WITH PROPOFOL
Anesthesia: General

## 2018-07-20 MED ORDER — HYDROMORPHONE HCL 1 MG/ML IJ SOLN: 0.2500 mg | INTRAMUSCULAR | Status: DC | PRN

## 2018-07-20 MED ORDER — CHLORHEXIDINE GLUCONATE CLOTH 2 % EX PADS: 6.0000 | MEDICATED_PAD | Freq: Once | CUTANEOUS | Status: DC

## 2018-07-20 MED ORDER — KETAMINE HCL 50 MG/5ML IJ SOSY
PREFILLED_SYRINGE | INTRAMUSCULAR | Status: AC
  Filled 2018-07-20: qty 5

## 2018-07-20 MED ORDER — HYDROCODONE-ACETAMINOPHEN 7.5-325 MG PO TABS: 1.0000 | ORAL_TABLET | Freq: Once | ORAL | Status: DC | PRN

## 2018-07-20 MED ORDER — KETAMINE HCL 10 MG/ML IJ SOLN
INTRAMUSCULAR | Status: DC | PRN
  Administered 2018-07-20 (×2): 10 mg via INTRAVENOUS

## 2018-07-20 MED ORDER — PROPOFOL 500 MG/50ML IV EMUL
INTRAVENOUS | Status: DC | PRN
  Administered 2018-07-20: 11:00:00 via INTRAVENOUS
  Administered 2018-07-20: 150 ug/kg/min via INTRAVENOUS

## 2018-07-20 MED ORDER — LACTATED RINGERS IV SOLN
INTRAVENOUS | Status: DC
  Administered 2018-07-20: 10:00:00 via INTRAVENOUS

## 2018-07-20 MED ORDER — MIDAZOLAM HCL 2 MG/2ML IJ SOLN: 0.5000 mg | Freq: Once | INTRAMUSCULAR | Status: DC | PRN

## 2018-07-20 MED ORDER — PROMETHAZINE HCL 25 MG/ML IJ SOLN: 6.2500 mg | INTRAMUSCULAR | Status: DC | PRN

## 2018-07-20 MED ORDER — MIDAZOLAM HCL 2 MG/2ML IJ SOLN
INTRAMUSCULAR | Status: AC
  Filled 2018-07-20: qty 2

## 2018-07-20 MED ORDER — PROPOFOL 10 MG/ML IV BOLUS
INTRAVENOUS | Status: DC | PRN
  Administered 2018-07-20 (×2): 20 mg via INTRAVENOUS

## 2018-07-20 NOTE — Anesthesia Postprocedure Evaluation (Signed)
Anesthesia Post Note  Patient: Kristen [name]  Procedure(s) Performed: COLONOSCOPY WITH PROPOFOL (N/A ) ESOPHAGOGASTRODUODENOSCOPY (EGD) WITH PROPOFOL (N/A ) BIOPSY POLYPECTOMY  Patient location during evaluation: PACU Anesthesia Type: General Level of consciousness: awake and alert and oriented Pain management: pain level controlled Vital Signs Assessment: post-procedure vital signs reviewed and stable Respiratory status: spontaneous breathing Cardiovascular status: blood pressure returned to baseline and stable Postop Assessment: no apparent nausea or vomiting Anesthetic complications: no     Last Vitals:  Vitals:   07/20/18 0933  BP: 125/80  Pulse: 66  Resp: (!) 23  Temp: 36.9 C  SpO2: 96%    Last Pain:  Vitals:   07/20/18 0933  TempSrc: Oral  PainSc: 0-No pain                 Frida Wahlstrom

## 2018-07-20 NOTE — Anesthesia Preprocedure Evaluation (Signed)
Anesthesia Evaluation  Patient identified by MRN, date of birth, ID band Patient awake  General Assessment Comment:Pt and mother report PONV in past   Reviewed: Allergy & Precautions, NPO status , Patient's Chart, lab work & pertinent test results, reviewed documented beta blocker date and time   History of Anesthesia Complications (+) PONV, Family history of anesthesia reaction and history of anesthetic complications  Airway Mallampati: I  TM Distance: >3 FB Neck ROM: Full    Dental no notable dental hx. (+) Teeth Intact   Pulmonary neg pulmonary ROS, former smoker,    Pulmonary exam normal breath sounds clear to auscultation       Cardiovascular Exercise Tolerance: Good negative cardio ROS Normal cardiovascular examI Rhythm:Regular Rate:Normal     Neuro/Psych Anxiety Depression Bipolar Disorder Recently off lithium for Reaction to mednegative neurological ROS  negative psych ROS   GI/Hepatic Neg liver ROS, GERD  Medicated and Controlled,  Endo/Other  Hypothyroidism   Renal/GU Renal InsufficiencyRenal disease  negative genitourinary   Musculoskeletal  (+) Arthritis , Osteoarthritis,    Abdominal   Peds negative pediatric ROS (+)  Hematology negative hematology ROS (+) anemia ,   Anesthesia Other Findings   Reproductive/Obstetrics negative OB ROS                             Anesthesia Physical Anesthesia Plan  ASA: II  Anesthesia Plan: General   Post-op Pain Management:    Induction: Intravenous  PONV Risk Score and Plan:   Airway Management Planned: Nasal Cannula and Simple Face Mask  Additional Equipment:   Intra-op Plan:   Post-operative Plan:   Informed Consent: I have reviewed the patients History and Physical, chart, labs and discussed the procedure including the risks, benefits and alternatives for the proposed anesthesia with the patient or authorized  representative who has indicated his/her understanding and acceptance.     Dental advisory given  Plan Discussed with: CRNA  Anesthesia Plan Comments: (Plan Full PPE use  Plan GA with GETA back up as needed  WTP same after Q&A Covid screen done 5/29 -reports has been quarantined since)        Anesthesia Quick Evaluation

## 2018-07-20 NOTE — Transfer of Care (Signed)
Immediate Anesthesia Transfer of Care Note  Patient: Kristen [name]  Procedure(s) Performed: COLONOSCOPY WITH PROPOFOL (N/A ) ESOPHAGOGASTRODUODENOSCOPY (EGD) WITH PROPOFOL (N/A ) BIOPSY POLYPECTOMY  Patient Location: PACU  Anesthesia Type:General  Level of Consciousness: awake  Airway & Oxygen Therapy: Patient Spontanous Breathing  Post-op Assessment: Report given to RN  Post vital signs: Reviewed and stable  Last Vitals:  Vitals Value Taken Time  BP 125/64 07/20/2018 11:04 AM  Temp    Pulse 64 07/20/2018 11:06 AM  Resp 19 07/20/2018 11:06 AM  SpO2 99 % 07/20/2018 11:06 AM  Vitals shown include unvalidated device data.  Last Pain:  Vitals:   07/20/18 0933  TempSrc: Oral  PainSc: 0-No pain         Complications: No apparent anesthesia complications

## 2018-07-20 NOTE — Op Note (Signed)
[facility] Patient Name: Kristen [name] Procedure Date: 07/20/2018 10:07 AM MRN: [id number] Date of Birth: 02-09-1971 Attending MD: Norvel Richards , MD CSN: [id number] Age: 48 Admit Type: Outpatient Procedure:                Upper GI endoscopy Indications:              Suspected gastro-esophageal reflux disease, Heme                            positive stool Providers:                Norvel Richards, MD, Janeece Riggers, RN, Raphael Gibney, Technician Referring MD:              Medicines:                Propofol per Anesthesia Complications:            No immediate complications. Estimated Blood Loss:     Estimated blood loss was minimal. Procedure:                Pre-Anesthesia Assessment:                           - Prior to the procedure, a History and Physical                            was performed, and patient medications and                            allergies were reviewed. The patient's tolerance of                            previous anesthesia was also reviewed. The risks                            and benefits of the procedure and the sedation                            options and risks were discussed with the patient.                            All questions were answered, and informed consent                            was obtained. Prior Anticoagulants: The patient has                            taken no previous anticoagulant or antiplatelet                            agents. ASA Grade Assessment: II - A patient with  mild systemic disease. After reviewing the risks                            and benefits, the patient was deemed in                            satisfactory condition to undergo the procedure.                           After obtaining informed consent, the endoscope was                            passed under direct vision. Throughout the                            procedure, the patient's  blood pressure, pulse, and                            oxygen saturations were monitored continuously. The                            [id number]-H190 ([id number]) scope was introduced through the                            mouth, and advanced to the second part of duodenum.                            The upper GI endoscopy was accomplished without                            difficulty. The patient tolerated the procedure                            well. Scope In: 10:29:27 AM Scope Out: 10:34:06 AM Total Procedure Duration: 0 hours 4 minutes 39 seconds  Findings:      The examined esophagus was normal.      A small hiatal hernia was present.      Diffuse moderately erythematous mucosa was found in the entire examined       stomach. Nearly healed 1 cm elliptical ulcer on the lesser curvature.       Did not see an infiltrating process. Patent pylorus.      The duodenal bulb and second portion of the duodenum were normal.       Biopsies of the abnormal gastric mucosa taken. Impression:               - Normal esophagus.                           - Small hiatal hernia.                           - Erythematous mucosa in the stomach. Healing                            gastric ulcer; status post  biopsy. Continue                            Protonix 40 mg twice daily. Continue to avoid                            NSAIDs/aspirin products. Follow-up on biopsies. See                            colonoscopy report                           - Normal duodenal bulb and second portion of the                            duodenum.                           - No specimens collected. Moderate Sedation:      Moderate (conscious) sedation was personally administered by an       anesthesia professional. The following parameters were monitored: oxygen       saturation, heart rate, blood pressure, respiratory rate, EKG, adequacy       of pulmonary ventilation, and response to care. Recommendation:           - Patient has a  contact number available for                            emergencies. The signs and symptoms of potential                            delayed complications were discussed with the                            patient. Return to normal activities tomorrow.                            Written discharge instructions were provided to the                            patient.                           - Continue present medications. Procedure Code(s):        --- Professional ---                           [phone], Esophagogastroduodenoscopy, flexible,                            transoral; diagnostic, including collection of                            specimen(s) by brushing or washing, when performed                            (separate procedure) Diagnosis Code(s):        ---  Professional ---                           K44.9, Diaphragmatic hernia without obstruction or                            gangrene                           K31.89, Other diseases of stomach and duodenum                           R19.5, Other fecal abnormalities CPT copyright 2019 [employer]. All rights reserved. The codes documented in this report are preliminary and upon coder review may  be revised to meet current compliance requirements. Cristopher Estimable. Rourk, MD Norvel Richards, MD 07/20/2018 10:40:11 AM This report has been signed electronically. Number of Addenda: 0

## 2018-07-20 NOTE — Op Note (Signed)
[facility] Patient Name: Kristen [name] Procedure Date: 07/20/2018 10:37 AM MRN: [id number] Date of Birth: [birth year] Attending MD: Norvel Richards , MD CSN: [id number] Age: 48 Admit Type: Outpatient Procedure:                Colonoscopy Indications:              Heme positive stool Providers:                Norvel Richards, MD, Janeece Riggers, RN, Raphael Gibney, Technician Referring MD:              Medicines:                Propofol per Anesthesia Complications:            No immediate complications. Estimated Blood Loss:     Estimated blood loss was minimal. Procedure:                Pre-Anesthesia Assessment:                           - Prior to the procedure, a History and Physical                            was performed, and patient medications and                            allergies were reviewed. The patient's tolerance of                            previous anesthesia was also reviewed. The risks                            and benefits of the procedure and the sedation                            options and risks were discussed with the patient.                            All questions were answered, and informed consent                            was obtained. Prior Anticoagulants: The patient has                            taken no previous anticoagulant or antiplatelet                            agents. ASA Grade Assessment: II - A patient with                            mild systemic disease. After reviewing the risks  and benefits, the patient was deemed in                            satisfactory condition to undergo the procedure.                           After obtaining informed consent, the colonoscope                            was passed under direct vision. Throughout the                            procedure, the patient's blood pressure, pulse, and                            oxygen saturations  were monitored continuously. The                            CF-HQ190L ([id number] scope was introduced through                            the anus and advanced to the the cecum, identified                            by appendiceal orifice and ileocecal valve. The                            colonoscopy was performed without difficulty. The                            patient tolerated the procedure well. The quality                            of the bowel preparation was adequate. The entire                            colon was well visualized. Scope In: 10:41:50 AM Scope Out: 10:58:37 AM Scope Withdrawal Time: 0 hours 6 minutes 46 seconds  Total Procedure Duration: 0 hours 16 minutes 47 seconds  Findings:      The perianal and digital rectal examinations were normal.      Scattered diverticula were found in the sigmoid colon, descending colon       and transverse colon.      Non-bleeding internal hemorrhoids were found. The hemorrhoids were       moderate, medium-sized and Grade I (internal hemorrhoids that do not       prolapse).      A 5 mm polyp was found in the splenic flexure. The polyp was       semi-pedunculated. The polyp was removed with a cold snare. Resection       and retrieval were complete. Estimated blood loss was minimal.       Examination was otherwise normal. Impression:               - Diverticulosis in the sigmoid colon, in the  descending colon and in the transverse colon. There                            was no evidence of diverticular bleeding.                           - Non-bleeding internal hemorrhoids.                           - One 5 mm polyp at the splenic flexure, removed                            with a cold snare. Resected and retrieved. Moderate Sedation:      Moderate (conscious) sedation was personally administered by an       anesthesia professional. The following parameters were monitored: oxygen       saturation, heart  rate, blood pressure, respiratory rate, EKG, adequacy       of pulmonary ventilation, and response to care. Recommendation:           - Patient has a contact number available for                            emergencies. The signs and symptoms of potential                            delayed complications were discussed with the                            patient. Return to normal activities tomorrow.                            Written discharge instructions were provided to the                            patient.                           - Advance diet as tolerated.                           - Continue present medications.                           - Repeat colonoscopy date to be determined after                            pending pathology results are reviewed for                            surveillance based on pathology results.                           - Return to GI office in 3 months. See EGD report.  At patient's request, spoke to patient's husband,                            Legrand Como, at [phone] regarding impression and                            recommendations. Procedure Code(s):        --- Professional ---                           [phone], Colonoscopy, flexible; with removal of                            tumor(s), polyp(s), or other lesion(s) by snare                            technique Diagnosis Code(s):        --- Professional ---                           K64.0, First degree hemorrhoids                           K63.5, Polyp of colon                           R19.5, Other fecal abnormalities                           K57.30, Diverticulosis of large intestine without                            perforation or abscess without bleeding CPT copyright 2019 [employer]. All rights reserved. The codes documented in this report are preliminary and upon coder review may  be revised to meet current compliance requirements. Cristopher Estimable.  Arnette Driggs, MD Norvel Richards, MD 07/20/2018 11:10:40 AM This report has been signed electronically. Number of Addenda: 0

## 2018-07-20 NOTE — Discharge Instructions (Signed)
°Colonoscopy °Discharge Instructions ° °Read the instructions outlined below and refer to this sheet in the next few weeks. These discharge instructions provide you with general information on caring for yourself after you leave the hospital. Your doctor may also give you specific instructions. While your treatment has been planned according to the most current medical practices available, unavoidable complications occasionally occur. If you have any problems or questions after discharge, call Dr. Rourk at [phone]. °ACTIVITY °· You may resume your regular activity, but move at a slower pace for the next 24 hours.  °· Take frequent rest periods for the next 24 hours.  °· Walking will help get rid of the air and reduce the bloated feeling in your belly (abdomen).  °· No driving for 24 hours (because of the medicine (anesthesia) used during the test).   °· Do not sign any important legal documents or operate any machinery for 24 hours (because of the anesthesia used during the test).  °NUTRITION °· Drink plenty of fluids.  °· You may resume your normal diet as instructed by your doctor.  °· Begin with a light meal and progress to your normal diet. Heavy or fried foods are harder to digest and may make you feel sick to your stomach (nauseated).  °· Avoid alcoholic beverages for 24 hours or as instructed.  °MEDICATIONS °· You may resume your normal medications unless your doctor tells you otherwise.  °WHAT YOU CAN EXPECT TODAY °· Some feelings of bloating in the abdomen.  °· Passage of more gas than usual.  °· Spotting of blood in your stool or on the toilet paper.  °IF YOU HAD POLYPS REMOVED DURING THE COLONOSCOPY: °· No aspirin products for 7 days or as instructed.  °· No alcohol for 7 days or as instructed.  °· Eat a soft diet for the next 24 hours.  °FINDING OUT THE RESULTS OF YOUR TEST °Not all test results are available during your visit. If your test results are not back during the visit, make an appointment  with your caregiver to find out the results. Do not assume everything is normal if you have not heard from your caregiver or the medical facility. It is important for you to follow up on all of your test results.  °SEEK IMMEDIATE MEDICAL ATTENTION IF: °· You have more than a spotting of blood in your stool.  °· Your belly is swollen (abdominal distention).  °· You are nauseated or vomiting.  °· You have a temperature over 101.  °· You have abdominal pain or discomfort that is severe or gets worse throughout the day.  °EGD °Discharge instructions °Please read the instructions outlined below and refer to this sheet in the next few weeks. These discharge instructions provide you with general information on caring for yourself after you leave the hospital. Your doctor may also give you specific instructions. While your treatment has been planned according to the most current medical practices available, unavoidable complications occasionally occur. If you have any problems or questions after discharge, please call your doctor. °ACTIVITY °· You may resume your regular activity but move at a slower pace for the next 24 hours.  °· Take frequent rest periods for the next 24 hours.  °· Walking will help expel (get rid of) the air and reduce the bloated feeling in your abdomen.  °· No driving for 24 hours (because of the anesthesia (medicine) used during the test).  °· You may shower.  °· Do not sign any important   legal documents or operate any machinery for 24 hours (because of the anesthesia used during the test).  NUTRITION  Drink plenty of fluids.   You may resume your normal diet.   Begin with a light meal and progress to your normal diet.   Avoid alcoholic beverages for 24 hours or as instructed by your caregiver.  MEDICATIONS  You may resume your normal medications unless your caregiver tells you otherwise.  WHAT YOU CAN EXPECT TODAY  You may experience abdominal discomfort such as a feeling of fullness  or gas pains.  FOLLOW-UP  Your doctor will discuss the results of your test with you.  SEEK IMMEDIATE MEDICAL ATTENTION IF ANY OF THE FOLLOWING OCCUR:  Excessive nausea (feeling sick to your stomach) and/or vomiting.   Severe abdominal pain and distention (swelling).   Trouble swallowing.   Temperature over 101 F (37.8 C).   Rectal bleeding or vomiting of blood.   Avoid aspirin/NSAID products  Continue Protonix 40 mg twice daily  Colon polyp and diverticulosis information provided  Further recommendations to follow pending review of pathology report  Office visit with [place] in 3 months    Colon Polyps  Polyps are tissue growths inside the body. Polyps can grow in many places, including the large intestine (colon). A polyp may be a round bump or a mushroom-shaped growth. You could have one polyp or several. Most colon polyps are noncancerous (benign). However, some colon polyps can become cancerous over time. Finding and removing the polyps early can help prevent this. What are the causes? The exact cause of colon polyps is not known. What increases the risk? You are more likely to develop this condition if you:  Have a family history of colon cancer or colon polyps.  Are older than 80 or older than 45 if you are African American.  Have inflammatory bowel disease, such as ulcerative colitis or Crohn's disease.  Have certain hereditary conditions, such as: ? Familial adenomatous polyposis. ? Lynch syndrome. ? Turcot syndrome. ? Peutz-Jeghers syndrome.  Are overweight.  Smoke cigarettes.  Do not get enough exercise.  Drink too much alcohol.  Eat a diet that is high in fat and red meat and low in fiber.  Had childhood cancer that was treated with abdominal radiation. What are the signs or symptoms? Most polyps do not cause symptoms. If you have symptoms, they may include:  Blood coming from your rectum when having a bowel movement.  Blood in your stool.  The stool may look dark red or black.  Abdominal pain.  A change in bowel habits, such as constipation or diarrhea. How is this diagnosed? This condition is diagnosed with a colonoscopy. This is a procedure in which a lighted, flexible scope is inserted into the anus and then passed into the colon to examine the area. Polyps are sometimes found when a colonoscopy is done as part of routine cancer screening tests. How is this treated? Treatment for this condition involves removing any polyps that are found. Most polyps can be removed during a colonoscopy. Those polyps will then be tested for cancer. Additional treatment may be needed depending on the results of testing. Follow these instructions at home: Lifestyle  Maintain a healthy weight, or lose weight if recommended by your health care provider.  Exercise every day or as told by your health care provider.  Do not use any products that contain nicotine or tobacco, such as cigarettes and e-cigarettes. If you need help quitting, ask your health care  provider.  If you drink alcohol, limit how much you have: ? 0-1 drink a day for women. ? 0-2 drinks a day for men.  Be aware of how much alcohol is in your drink. In the [place] one drink equals one 12 oz bottle of beer (355 mL), one 5 oz glass of wine (148 mL), or one 1 oz shot of hard liquor (44 mL). Eating and drinking   Eat foods that are high in fiber, such as fruits, vegetables, and whole grains.  Eat foods that are high in calcium and vitamin D, such as milk, cheese, yogurt, eggs, liver, fish, and broccoli.  Limit foods that are high in fat, such as fried foods and desserts.  Limit the amount of red meat and processed meat you eat, such as hot dogs, sausage, bacon, and lunch meats. General instructions  Keep all follow-up visits as told by your health care provider. This is important. ? This includes having regularly scheduled colonoscopies. ? Talk to your health care provider  about when you need a colonoscopy. Contact a health care provider if:  You have new or worsening bleeding during a bowel movement.  You have new or increased blood in your stool.  You have a change in bowel habits.  You lose weight for no known reason. Summary  Polyps are tissue growths inside the body. Polyps can grow in many places, including the colon.  Most colon polyps are noncancerous (benign), but some can become cancerous over time.  This condition is diagnosed with a colonoscopy.  Treatment for this condition involves removing any polyps that are found. Most polyps can be removed during a colonoscopy. This information is not intended to replace advice given to you by your health care provider. Make sure you discuss any questions you have with your health care provider. Document Released: 10/25/2003 Document Revised: 05/15/2017 Document Reviewed: 05/15/2017 Elsevier Interactive Patient Education  2019 [employer].    Diverticulosis  Diverticulosis is a condition that develops when small pouches (diverticula) form in the wall of the large intestine (colon). The colon is where water is absorbed and stool is formed. The pouches form when the inside layer of the colon pushes through weak spots in the outer layers of the colon. You may have a few pouches or many of them. What are the causes? The cause of this condition is not known. What increases the risk? The following factors may make you more likely to develop this condition:  Being older than age 66. Your risk for this condition increases with age. Diverticulosis is rare among people younger than age 11. By age 51, many people have it.  Eating a low-fiber diet.  Having frequent constipation.  Being overweight.  Not getting enough exercise.  Smoking.  Taking over-the-counter pain medicines, like aspirin and ibuprofen.  Having a family history of diverticulosis. What are the signs or symptoms? In most people,  there are no symptoms of this condition. If you do have symptoms, they may include:  Bloating.  Cramps in the abdomen.  Constipation or diarrhea.  Pain in the lower left side of the abdomen. How is this diagnosed? This condition is most often diagnosed during an exam for other colon problems. Because diverticulosis usually has no symptoms, it often cannot be diagnosed independently. This condition may be diagnosed by:  Using a flexible scope to examine the colon (colonoscopy).  Taking an X-ray of the colon after dye has been put into the colon (barium enema).  Doing a  CT scan. How is this treated? You may not need treatment for this condition if you have never developed an infection related to diverticulosis. If you have had an infection before, treatment may include:  Eating a high-fiber diet. This may include eating more fruits, vegetables, and grains.  Taking a fiber supplement.  Taking a live bacteria supplement (probiotic).  Taking medicine to relax your colon.  Taking antibiotic medicines. Follow these instructions at home:  Drink 6-8 glasses of water or more each day to prevent constipation.  Try not to strain when you have a bowel movement.  If you have had an infection before: ? Eat more fiber as directed by your health care provider or your diet and nutrition specialist (dietitian). ? Take a fiber supplement or probiotic, if your health care provider approves.  Take over-the-counter and prescription medicines only as told by your health care provider.  If you were prescribed an antibiotic, take it as told by your health care provider. Do not stop taking the antibiotic even if you start to feel better.  Keep all follow-up visits as told by your health care provider. This is important. Contact a health care provider if:  You have pain in your abdomen.  You have bloating.  You have cramps.  You have not had a bowel movement in 3 days. Get help right away  if:  Your pain gets worse.  Your bloating becomes very bad.  You have a fever or chills, and your symptoms suddenly get worse.  You vomit.  You have bowel movements that are bloody or black.  You have bleeding from your rectum. Summary  Diverticulosis is a condition that develops when small pouches (diverticula) form in the wall of the large intestine (colon).  You may have a few pouches or many of them.  This condition is most often diagnosed during an exam for other colon problems.  If you have had an infection related to diverticulosis, treatment may include increasing the fiber in your diet, taking supplements, or taking medicines. This information is not intended to replace advice given to you by your health care provider. Make sure you discuss any questions you have with your health care provider. Document Released: 10/26/2003 Document Revised: 12/18/2015 Document Reviewed: 12/18/2015 Elsevier Interactive Patient Education  2019 [employer], Care After These instructions provide you with information about caring for yourself after your procedure. Your health care provider may also give you more specific instructions. Your treatment has been planned according to current medical practices, but problems sometimes occur. Call your health care provider if you have any problems or questions after your procedure. What can I expect after the procedure? After your procedure, you may:  Feel sleepy for several hours.  Feel clumsy and have poor balance for several hours.  Feel forgetful about what happened after the procedure.  Have poor judgment for several hours.  Feel nauseous or vomit.  Have a sore throat if you had a breathing tube during the procedure. Follow these instructions at home: For at least 24 hours after the procedure:      Have a responsible adult stay with you. It is important to have someone help care for you until you  are awake and alert.  Rest as needed.  Do not: ? Participate in activities in which you could fall or become injured. ? Drive. ? Use heavy machinery. ? Drink alcohol. ? Take sleeping pills or medicines that cause drowsiness. ?  Make important decisions or sign legal documents. ? Take care of children on your own. Eating and drinking  Follow the diet that is recommended by your health care provider.  If you vomit, drink water, juice, or soup when you can drink without vomiting.  Make sure you have little or no nausea before eating solid foods. General instructions  Take over-the-counter and prescription medicines only as told by your health care provider.  If you have sleep apnea, surgery and certain medicines can increase your risk for breathing problems. Follow instructions from your health care provider about wearing your sleep device: ? Anytime you are sleeping, including during daytime naps. ? While taking prescription pain medicines, sleeping medicines, or medicines that make you drowsy.  If you smoke, do not smoke without supervision.  Keep all follow-up visits as told by your health care provider. This is important. Contact a health care provider if:  You keep feeling nauseous or you keep vomiting.  You feel light-headed.  You develop a rash.  You have a fever. Get help right away if:  You have trouble breathing. Summary  For several hours after your procedure, you may feel sleepy and have poor judgment.  Have a responsible adult stay with you for at least 24 hours or until you are awake and alert. This information is not intended to replace advice given to you by your health care provider. Make sure you discuss any questions you have with your health care provider. Document Released: 05/21/2015 Document Revised: 09/13/2016 Document Reviewed: 05/21/2015 Elsevier Interactive Patient Education  2019 [employer].

## 2018-07-20 NOTE — Interval H&P Note (Signed)
History and Physical Interval Note:  07/20/2018 10:12 AM  Kristen [name]  has presented today for surgery, with the diagnosis of anemia, heme + stool.  The various methods of treatment have been discussed with the patient and family. After consideration of risks, benefits and other options for treatment, the patient has consented to  Procedure(s) with comments: COLONOSCOPY WITH PROPOFOL (N/A) - 10:00am ESOPHAGOGASTRODUODENOSCOPY (EGD) WITH PROPOFOL (N/A) as a surgical intervention.  The patient's history has been reviewed, patient examined, no change in status, stable for surgery.  I have reviewed the patient's chart and labs.  Questions were answered to the patient's satisfaction.     Kristen [name]  No change.  No dysphagia.  Reflux better with twice daily Protonix.  Here for diagnostic EGD and colonoscopy per plan.  The risks, benefits, limitations, imponderables and alternatives regarding both EGD and colonoscopy have been reviewed with the patient. Questions have been answered. All parties agreeable.

## 2018-07-22 ENCOUNTER — Encounter: Payer: Self-pay | Admitting: Internal Medicine

## 2018-07-27 ENCOUNTER — Encounter (HOSPITAL_COMMUNITY): Payer: Self-pay | Admitting: Internal Medicine

## 2018-08-12 ENCOUNTER — Ambulatory Visit: Payer: 59 | Admitting: Physician Assistant

## 2018-08-13 ENCOUNTER — Other Ambulatory Visit: Payer: Self-pay | Admitting: Urology

## 2018-08-18 ENCOUNTER — Other Ambulatory Visit (HOSPITAL_COMMUNITY)
Admission: RE | Admit: 2018-08-18 | Discharge: 2018-08-18 | Disposition: A | Discharge status: Home / Self Care | Payer: 59 | Source: Ambulatory Visit | Attending: Urology | Admitting: Urology

## 2018-08-18 DIAGNOSIS — Z1159 Encounter for screening for other viral diseases: Secondary | ICD-10-CM | POA: Insufficient documentation

## 2018-08-18 DIAGNOSIS — Z01812 Encounter for preprocedural laboratory examination: Secondary | ICD-10-CM | POA: Insufficient documentation

## 2018-08-18 LAB — SARS CORONAVIRUS 2 (TAT 6-24 HRS): SARS Coronavirus 2: NEGATIVE

## 2018-08-20 ENCOUNTER — Ambulatory Visit (INDEPENDENT_AMBULATORY_CARE_PROVIDER_SITE_OTHER): Payer: 59 | Admitting: Physician Assistant

## 2018-08-20 ENCOUNTER — Encounter: Payer: Self-pay | Admitting: Physician Assistant

## 2018-08-20 ENCOUNTER — Encounter (HOSPITAL_BASED_OUTPATIENT_CLINIC_OR_DEPARTMENT_OTHER): Payer: Self-pay | Admitting: *Deleted

## 2018-08-20 ENCOUNTER — Other Ambulatory Visit: Payer: Self-pay

## 2018-08-20 DIAGNOSIS — F411 Generalized anxiety disorder: Secondary | ICD-10-CM | POA: Diagnosis not present

## 2018-08-20 DIAGNOSIS — F319 Bipolar disorder, unspecified: Secondary | ICD-10-CM | POA: Diagnosis not present

## 2018-08-20 DIAGNOSIS — R454 Irritability and anger: Secondary | ICD-10-CM

## 2018-08-20 DIAGNOSIS — E032 Hypothyroidism due to medicaments and other exogenous substances: Secondary | ICD-10-CM | POA: Diagnosis not present

## 2018-08-20 MED ORDER — QUETIAPINE FUMARATE ER 300 MG PO TB24: 300.0000 mg | ORAL_TABLET | Freq: Every day | ORAL | 0 refills | Status: DC

## 2018-08-20 NOTE — Progress Notes (Signed)

## 2018-08-20 NOTE — Progress Notes (Signed)
[facility]  Patient ID: Kristen [name],  MRN: [id number]  PCP: Manon Hilding, MD  Date of Evaluation: 08/20/2018 Time spent:15 minutes  Chief Complaint:  Chief Complaint    Other     Virtual Visit via Telephone Note  I connected with patient by a video enabled telemedicine application or telephone, with their informed consent, and verified patient privacy and that I am speaking with the correct person using two identifiers.  I am private, in my office and the patient is home.  I discussed the limitations, risks, security and privacy concerns of performing an evaluation and management service by telephone and the availability of in person appointments. I also discussed with the patient that there may be a patient responsible charge related to this service. The patient expressed understanding and agreed to proceed.   I discussed the assessment and treatment plan with the patient. The patient was provided an opportunity to ask questions and all were answered. The patient agreed with the plan and demonstrated an understanding of the instructions.   The patient was advised to call back or seek an in-person evaluation if the symptoms worsen or if the condition fails to improve as anticipated.  I provided 15 minutes of non-face-to-face time during this encounter.  HISTORY/CURRENT STATUS: HPI c/o irritability.  Since being off the lithium (was hospitalized a few months ago for toxicity) she has been much more irritable.  "I cannot stand myself and I know my family cannot.  I really need something to help me.  I will go off for no reason at all.  But also my son who is in the [employer] in [place] has gotten the brunt of it.  He has not been returning my calls or messages so I finally just told him I am done with them.  Course he has not responded and I wouldn't either."  Sleeps good.  No impulsivity or risky behavior.  No increased spending or libido.  No grandiosity.  Still  gets anxious but is pretty well controlled with the Xanax.  States that attention is good without easy distractibility.  Able to focus on things and finish tasks to completion.  Has more energy being on the modafinil.  Not depressed.  States she is tired of social distancing because of the coronavirus.  She does enjoy some things.  Her energy and motivation are good.  Denies dizziness, syncope, seizures, numbness, tingling, tremor, tics, unsteady gait, slurred speech, confusion. Denies muscle or joint pain, stiffness, or dystonia.  Individual Medical History/ Review of Systems: Changes? :Yes  having surgery for a kidney stone tomorrow.    Past medications for mental health diagnoses include: Prozac, Paxil, Lexapro, Celexa, Zoloft, Viibryd, Effexor, Cymbalta, Risperdal, Latuda, Lamictal, VPA, Li had toxicity, Xanax, Wellbutrin, Abilify, Traz, Strattera, Kapvay, Saphris, Adderall, Vyvanse, Lunesta, Sonata, CBZ, Vraylar, Seroquel  Allergies: Iohexol  Current Medications:  Current Outpatient Medications:  .  acetaminophen (TYLENOL) 325 MG tablet, Take 2 tablets (650 mg total) by mouth every 6 (six) hours as needed for mild pain (or Fever >/= 101)., Disp: 12 tablet, Rfl: 1 .  ALPRAZolam (XANAX) 1 MG tablet, Take 1 tablet (1 mg total) by mouth 3 (three) times daily as needed. for anxiety (Patient taking differently: Take 1 mg by mouth 3 (three) times daily as needed for anxiety. ), Disp: 90 tablet, Rfl: 5 .  atorvastatin (LIPITOR) 10 MG tablet, Take 10 mg by mouth at bedtime. , Disp: , Rfl: 1 .  buPROPion (WELLBUTRIN XL)  150 MG 24 hr tablet, TAKE 3 TABLETS BY MOUTH IN THE MORNING (Patient taking differently: Take 350 mg by mouth daily. ), Disp: 90 tablet, Rfl: 5 .  calcium carbonate (TUMS - DOSED IN MG ELEMENTAL CALCIUM) 500 MG chewable tablet, Chew 2 tablets by mouth 2 (two) times daily as needed for indigestion or heartburn. , Disp: , Rfl:  .  cephALEXin (KEFLEX) 500 MG capsule, Take 500 mg by  mouth daily., Disp: , Rfl:  .  cetirizine (ZYRTEC) 10 MG tablet, Take 10 mg by mouth daily as needed for allergies. , Disp: , Rfl:  .  Coenzyme Q10 (COQ10 PO), Take by mouth daily., Disp: , Rfl:  .  Ferrous Sulfate (IRON PO), Take by mouth daily., Disp: , Rfl:  .  fluticasone (FLONASE) 50 MCG/ACT nasal spray, Place 2 sprays into both nostrils daily as needed for allergies or rhinitis., Disp: , Rfl:  .  lamoTRIgine (LAMICTAL) 200 MG tablet, Take 2.5 tablets (500 mg total) by mouth daily. (Patient taking differently: Take 500 mg by mouth at bedtime. ), Disp: 75 tablet, Rfl: 5 .  levothyroxine (SYNTHROID) 75 MCG tablet, Take 75 mcg by mouth daily before breakfast. , Disp: , Rfl:  .  modafinil (PROVIGIL) 200 MG tablet, TAKE 1 TABLET BY MOUTH ONCE DAILY (Patient taking differently: Take 200 mg by mouth daily. ), Disp: 30 tablet, Rfl: 5 .  montelukast (SINGULAIR) 10 MG tablet, Take 10 mg by mouth daily. , Disp: , Rfl:  .  Multiple Vitamins-Minerals (GNP MEGA MULTI FOR WOMEN) TABS, Take by mouth daily., Disp: , Rfl:  .  pantoprazole (PROTONIX) 40 MG tablet, Take 1 tablet (40 mg total) by mouth 2 (two) times daily before a meal. (Patient taking differently: Take 40 mg by mouth 2 (two) times daily before a meal. ), Disp: 60 tablet, Rfl: 1 .  polyethylene glycol (MIRALAX / GLYCOLAX) 17 g packet, Take 17 g by mouth 2 (two) times daily. (Patient taking differently: Take 17 g by mouth at bedtime. 2 capfuls every night), Disp: 60 each, Rfl: 1 .  pramipexole (MIRAPEX) 1 MG tablet, Take 2 tablets (2 mg total) by mouth at bedtime., Disp: 60 tablet, Rfl: 5 .  propranolol (INDERAL) 20 MG tablet, Take 1 tablet (20 mg total) by mouth at bedtime. (Patient taking differently: Take 20 mg by mouth at bedtime. ), Disp: 30 tablet, Rfl: 5 .  ondansetron (ZOFRAN ODT) 4 MG disintegrating tablet, Take 1 tablet (4 mg total) by mouth every 8 (eight) hours as needed. (Patient not taking: Reported on 08/20/2018), Disp: 10 tablet, Rfl:  0 .  QUEtiapine (SEROQUEL XR) 300 MG 24 hr tablet, Take 1 tablet (300 mg total) by mouth at bedtime., Disp: 30 tablet, Rfl: 0 Medication Side Effects: none  Family Medical/ Social History: Changes? No  MENTAL HEALTH EXAM:  There were no vitals taken for this visit.There is no height or weight on file to calculate BMI.  General Appearance: unable to assess  Eye Contact:  unable to assess  Speech:  Clear and Coherent  Volume:  Normal  Mood:  Euthymic  Affect:  unable to assess  Thought Process:  Goal Directed  Orientation:  Full (Time, Place, and Person)  Thought Content: Logical   Suicidal Thoughts:  No  Homicidal Thoughts:  No  Memory:  WNL  Judgement:  Good  Insight:  Good  Psychomotor Activity:  unable to assess  Concentration:  Concentration: Good  Recall:  Good  Fund of Knowledge: Good  Language:  Good  Assets:  Desire for Improvement  ADL's:  Intact  Cognition: WNL  Prognosis:  Good    DIAGNOSES:    ICD-10-CM   1. Bipolar I disorder ([place])  F31.9   2. 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 Irritability and anger  R45.4     Receiving Psychotherapy: No    RECOMMENDATIONS:  Continue Xanax 1 mg 3 times daily as needed. Continue Wellbutrin XL 300 mg daily. Continue Lamictal 200 mg 1 p.o. every morning and 1-1/2 p.o. nightly. Continue modafinil 200 mg daily. Increase Seroquel XR to 300 milligrams nightly. Continue propranolol 20 mg nightly. Continue pramipexole 1 mg, 2 nightly. Recommend psychotherapy. Return in 4 to 6 weeks.  [name], PA-C

## 2018-08-20 NOTE — Progress Notes (Signed)
Spoke w/ pt via phone for pre-op interview.  Npo after mn.  Arrive at 1000.  Pt had covid test done yesterday.  Will take protonix, wellbutrin, synthroid, and singulair am dos w/ sips of water.

## 2018-08-21 ENCOUNTER — Ambulatory Visit (HOSPITAL_BASED_OUTPATIENT_CLINIC_OR_DEPARTMENT_OTHER)
Admission: RE | Admit: 2018-08-21 | Discharge: 2018-08-21 | Disposition: A | Discharge status: Home / Self Care | Payer: 59 | Attending: Urology | Admitting: Urology

## 2018-08-21 ENCOUNTER — Ambulatory Visit (HOSPITAL_BASED_OUTPATIENT_CLINIC_OR_DEPARTMENT_OTHER): Payer: 59 | Admitting: Anesthesiology

## 2018-08-21 ENCOUNTER — Encounter (HOSPITAL_BASED_OUTPATIENT_CLINIC_OR_DEPARTMENT_OTHER): Payer: Self-pay

## 2018-08-21 ENCOUNTER — Encounter (HOSPITAL_BASED_OUTPATIENT_CLINIC_OR_DEPARTMENT_OTHER)
Admission: RE | Disposition: A | Discharge status: Home / Self Care | Payer: Self-pay | Source: Home / Self Care | Attending: Urology

## 2018-08-21 DIAGNOSIS — Z6837 Body mass index (BMI) 37.0-37.9, adult: Secondary | ICD-10-CM | POA: Diagnosis not present

## 2018-08-21 DIAGNOSIS — Z8673 Personal history of transient ischemic attack (TIA), and cerebral infarction without residual deficits: Secondary | ICD-10-CM | POA: Diagnosis not present

## 2018-08-21 DIAGNOSIS — N132 Hydronephrosis with renal and ureteral calculous obstruction: Secondary | ICD-10-CM | POA: Diagnosis not present

## 2018-08-21 DIAGNOSIS — Z87891 Personal history of nicotine dependence: Secondary | ICD-10-CM | POA: Diagnosis not present

## 2018-08-21 DIAGNOSIS — Z888 Allergy status to other drugs, medicaments and biological substances status: Secondary | ICD-10-CM | POA: Insufficient documentation

## 2018-08-21 DIAGNOSIS — K219 Gastro-esophageal reflux disease without esophagitis: Secondary | ICD-10-CM | POA: Insufficient documentation

## 2018-08-21 DIAGNOSIS — E039 Hypothyroidism, unspecified: Secondary | ICD-10-CM | POA: Diagnosis not present

## 2018-08-21 DIAGNOSIS — N3020 Other chronic cystitis without hematuria: Secondary | ICD-10-CM | POA: Diagnosis not present

## 2018-08-21 DIAGNOSIS — N200 Calculus of kidney: Secondary | ICD-10-CM | POA: Diagnosis present

## 2018-08-21 DIAGNOSIS — Z79899 Other long term (current) drug therapy: Secondary | ICD-10-CM | POA: Diagnosis not present

## 2018-08-21 DIAGNOSIS — Z79890 Hormone replacement therapy (postmenopausal): Secondary | ICD-10-CM | POA: Diagnosis not present

## 2018-08-21 DIAGNOSIS — F419 Anxiety disorder, unspecified: Secondary | ICD-10-CM | POA: Diagnosis not present

## 2018-08-21 DIAGNOSIS — E7800 Pure hypercholesterolemia, unspecified: Secondary | ICD-10-CM | POA: Diagnosis not present

## 2018-08-21 DIAGNOSIS — E669 Obesity, unspecified: Secondary | ICD-10-CM | POA: Diagnosis not present

## 2018-08-21 DIAGNOSIS — E785 Hyperlipidemia, unspecified: Secondary | ICD-10-CM | POA: Diagnosis not present

## 2018-08-21 DIAGNOSIS — F319 Bipolar disorder, unspecified: Secondary | ICD-10-CM | POA: Insufficient documentation

## 2018-08-21 HISTORY — DX: Calculus of kidney: N20.0

## 2018-08-21 HISTORY — DX: Other constipation: K59.09

## 2018-08-21 HISTORY — DX: Hyperlipidemia, unspecified: E78.5

## 2018-08-21 HISTORY — PX: CYSTOSCOPY/URETEROSCOPY/HOLMIUM LASER/STENT PLACEMENT: SHX6546

## 2018-08-21 HISTORY — DX: Other seasonal allergic rhinitis: J30.2

## 2018-08-21 HISTORY — DX: Personal history of urinary (tract) infections: Z87.440

## 2018-08-21 HISTORY — DX: Sleep disorder, unspecified: G47.9

## 2018-08-21 HISTORY — DX: Bipolar disorder, unspecified: F31.9

## 2018-08-21 HISTORY — DX: Personal history of other mental and behavioral disorders: Z86.59

## 2018-08-21 HISTORY — DX: Hypothyroidism, unspecified: E03.9

## 2018-08-21 HISTORY — DX: Family history of other specified conditions: Z84.89

## 2018-08-21 HISTORY — DX: Diaphragmatic hernia without obstruction or gangrene: K44.9

## 2018-08-21 HISTORY — DX: Iron deficiency anemia, unspecified: D50.9

## 2018-08-21 HISTORY — DX: Presence of spectacles and contact lenses: Z97.3

## 2018-08-21 HISTORY — DX: Personal history of urinary calculi: Z87.442

## 2018-08-21 HISTORY — DX: Generalized anxiety disorder: F41.1

## 2018-08-21 SURGERY — CYSTOSCOPY/URETEROSCOPY/HOLMIUM LASER/STENT PLACEMENT
Anesthesia: General | Site: Ureter | Laterality: Left

## 2018-08-21 MED ORDER — LIDOCAINE 2% (20 MG/ML) 5 ML SYRINGE
INTRAMUSCULAR | Status: AC
  Filled 2018-08-21: qty 5

## 2018-08-21 MED ORDER — CEFAZOLIN SODIUM-DEXTROSE 2-4 GM/100ML-% IV SOLN
INTRAVENOUS | Status: AC
  Filled 2018-08-21: qty 100

## 2018-08-21 MED ORDER — TAMSULOSIN HCL 0.4 MG PO CAPS: 0.4000 mg | ORAL_CAPSULE | Freq: Every day | ORAL | 0 refills | Status: DC

## 2018-08-21 MED ORDER — SCOPOLAMINE 1 MG/3DAYS TD PT72
MEDICATED_PATCH | TRANSDERMAL | Status: DC | PRN
  Administered 2018-08-21: 1 via TRANSDERMAL

## 2018-08-21 MED ORDER — MIDAZOLAM HCL 2 MG/2ML IJ SOLN
INTRAMUSCULAR | Status: AC
  Filled 2018-08-21: qty 2

## 2018-08-21 MED ORDER — ONDANSETRON HCL 4 MG/2ML IJ SOLN
INTRAMUSCULAR | Status: AC
  Filled 2018-08-21: qty 2

## 2018-08-21 MED ORDER — ONDANSETRON HCL 4 MG/2ML IJ SOLN
4.0000 mg | Freq: Once | INTRAMUSCULAR | Status: DC | PRN
  Filled 2018-08-21: qty 2

## 2018-08-21 MED ORDER — FENTANYL CITRATE (PF) 100 MCG/2ML IJ SOLN
25.0000 ug | INTRAMUSCULAR | Status: DC | PRN
  Filled 2018-08-21: qty 1

## 2018-08-21 MED ORDER — LIDOCAINE 2% (20 MG/ML) 5 ML SYRINGE
INTRAMUSCULAR | Status: DC | PRN
  Administered 2018-08-21: 60 mg via INTRAVENOUS

## 2018-08-21 MED ORDER — CEPHALEXIN 500 MG PO CAPS: 500.0000 mg | ORAL_CAPSULE | Freq: Two times a day (BID) | ORAL | 0 refills | Status: DC

## 2018-08-21 MED ORDER — HYDROCODONE-ACETAMINOPHEN 5-325 MG PO TABS: 1.0000 | ORAL_TABLET | ORAL | 0 refills | Status: DC | PRN

## 2018-08-21 MED ORDER — KETOROLAC TROMETHAMINE 30 MG/ML IJ SOLN
INTRAMUSCULAR | Status: DC | PRN
  Administered 2018-08-21: 30 mg via INTRAVENOUS

## 2018-08-21 MED ORDER — FENTANYL CITRATE (PF) 100 MCG/2ML IJ SOLN
INTRAMUSCULAR | Status: DC | PRN
  Administered 2018-08-21: 50 ug via INTRAVENOUS
  Administered 2018-08-21: 100 ug via INTRAVENOUS
  Administered 2018-08-21: 50 ug via INTRAVENOUS

## 2018-08-21 MED ORDER — PROPOFOL 10 MG/ML IV BOLUS
INTRAVENOUS | Status: AC
  Filled 2018-08-21: qty 20

## 2018-08-21 MED ORDER — PHENAZOPYRIDINE HCL 200 MG PO TABS: 200.0000 mg | ORAL_TABLET | Freq: Three times a day (TID) | ORAL | 0 refills | Status: DC | PRN

## 2018-08-21 MED ORDER — LACTATED RINGERS IV SOLN
INTRAVENOUS | Status: DC
  Administered 2018-08-21: 11:00:00 via INTRAVENOUS
  Filled 2018-08-21: qty 1000

## 2018-08-21 MED ORDER — MIDAZOLAM HCL 2 MG/2ML IJ SOLN
INTRAMUSCULAR | Status: DC | PRN
  Administered 2018-08-21 (×2): 1 mg via INTRAVENOUS

## 2018-08-21 MED ORDER — PROPOFOL 10 MG/ML IV BOLUS
INTRAVENOUS | Status: DC | PRN
  Administered 2018-08-21: 150 mg via INTRAVENOUS
  Administered 2018-08-21: 50 mg via INTRAVENOUS

## 2018-08-21 MED ORDER — DEXAMETHASONE SODIUM PHOSPHATE 10 MG/ML IJ SOLN
INTRAMUSCULAR | Status: DC | PRN
  Administered 2018-08-21: 5 mg via INTRAVENOUS

## 2018-08-21 MED ORDER — FENTANYL CITRATE (PF) 100 MCG/2ML IJ SOLN
INTRAMUSCULAR | Status: AC
  Filled 2018-08-21: qty 2

## 2018-08-21 MED ORDER — ONDANSETRON HCL 4 MG/2ML IJ SOLN
INTRAMUSCULAR | Status: DC | PRN
  Administered 2018-08-21: 4 mg via INTRAVENOUS

## 2018-08-21 MED ORDER — DEXAMETHASONE SODIUM PHOSPHATE 10 MG/ML IJ SOLN
INTRAMUSCULAR | Status: AC
  Filled 2018-08-21: qty 1

## 2018-08-21 MED ORDER — SCOPOLAMINE 1 MG/3DAYS TD PT72
MEDICATED_PATCH | TRANSDERMAL | Status: AC
  Filled 2018-08-21: qty 1

## 2018-08-21 MED ORDER — CEFAZOLIN SODIUM-DEXTROSE 2-4 GM/100ML-% IV SOLN
2.0000 g | INTRAVENOUS | Status: AC
  Administered 2018-08-21: 2 g via INTRAVENOUS
  Filled 2018-08-21: qty 100

## 2018-08-21 MED ORDER — IOHEXOL 300 MG/ML  SOLN
INTRAMUSCULAR | Status: DC | PRN
  Administered 2018-08-21: 10 mL via URETHRAL

## 2018-08-21 MED ORDER — OXYCODONE HCL 5 MG/5ML PO SOLN
5.0000 mg | Freq: Once | ORAL | Status: DC | PRN
  Filled 2018-08-21: qty 5

## 2018-08-21 MED ORDER — ACETAMINOPHEN 500 MG PO TABS
ORAL_TABLET | ORAL | Status: AC
  Filled 2018-08-21: qty 2

## 2018-08-21 MED ORDER — OXYCODONE HCL 5 MG PO TABS
5.0000 mg | ORAL_TABLET | Freq: Once | ORAL | Status: DC | PRN
  Filled 2018-08-21: qty 1

## 2018-08-21 MED ORDER — KETOROLAC TROMETHAMINE 30 MG/ML IJ SOLN
INTRAMUSCULAR | Status: AC
  Filled 2018-08-21: qty 1

## 2018-08-21 SURGICAL SUPPLY — 29 items
BAG DRAIN URO-CYSTO SKYTR STRL (DRAIN) ×2 IMPLANT
BASKET STONE 1.7 NGAGE (UROLOGICAL SUPPLIES) IMPLANT
BASKET ZERO TIP NITINOL 2.4FR (BASKET) IMPLANT
BENZOIN TINCTURE PRP APPL 2/3 (GAUZE/BANDAGES/DRESSINGS) IMPLANT
CATH URET 5FR 28IN OPEN ENDED (CATHETERS) ×2 IMPLANT
CLOTH BEACON ORANGE TIMEOUT ST (SAFETY) ×2 IMPLANT
FIBER LASER FLEXIVA 365 (UROLOGICAL SUPPLIES) IMPLANT
FIBER LASER TRAC TIP (UROLOGICAL SUPPLIES) ×2 IMPLANT
GLOVE BIO SURGEON STRL SZ 6.5 (GLOVE) ×2 IMPLANT
GLOVE BIO SURGEON STRL SZ7.5 (GLOVE) ×2 IMPLANT
GLOVE BIOGEL PI IND STRL 6.5 (GLOVE) ×1 IMPLANT
GLOVE BIOGEL PI INDICATOR 6.5 (GLOVE) ×1
GOWN STRL REUS W/ TWL LRG LVL3 (GOWN DISPOSABLE) ×1 IMPLANT
GOWN STRL REUS W/TWL LRG LVL3 (GOWN DISPOSABLE) ×1
GOWN STRL REUS W/TWL XL LVL3 (GOWN DISPOSABLE) ×2 IMPLANT
GUIDEWIRE STR DUAL SENSOR (WIRE) IMPLANT
GUIDEWIRE ZIPWRE .038 STRAIGHT (WIRE) ×2 IMPLANT
IV NS 1000ML (IV SOLUTION)
IV NS 1000ML BAXH (IV SOLUTION) IMPLANT
IV NS IRRIG 3000ML ARTHROMATIC (IV SOLUTION) ×2 IMPLANT
KIT TURNOVER CYSTO (KITS) ×2 IMPLANT
MANIFOLD NEPTUNE II (INSTRUMENTS) ×2 IMPLANT
NS IRRIG 500ML POUR BTL (IV SOLUTION) ×2 IMPLANT
PACK CYSTO (CUSTOM PROCEDURE TRAY) ×2 IMPLANT
STENT URET 6FRX24 CONTOUR (STENTS) ×2 IMPLANT
STRIP CLOSURE SKIN 1/2X4 (GAUZE/BANDAGES/DRESSINGS) IMPLANT
SYR 10ML LL (SYRINGE) ×2 IMPLANT
TUBE CONNECTING 12X1/4 (SUCTIONS) ×2 IMPLANT
TUBING UROLOGY SET (TUBING) ×2 IMPLANT

## 2018-08-21 NOTE — Transfer of Care (Signed)
Immediate Anesthesia Transfer of Care Note  Patient: Kristen [name]  Procedure(s) Performed: CYSTOSCOPY LEFT RETROGRADE PYELOGRAM /URETEROSCOPY/HOLMIUM LASER/STENT PLACEMENT (Left Ureter)  Patient Location: PACU  Anesthesia Type:General  Level of Consciousness: sedated and patient cooperative  Airway & Oxygen Therapy: Patient Spontanous Breathing and Patient connected to nasal cannula oxygen  Post-op Assessment: Report given to RN and Post -op Vital signs reviewed and stable  Post vital signs: Reviewed and stable  Last Vitals:  Vitals Value Taken Time  BP    Temp    Pulse 83 08/21/18 1309  Resp 13 08/21/18 1309  SpO2 100 % 08/21/18 1309  Vitals shown include unvalidated device data.  Last Pain:  Vitals:   08/21/18 0954  TempSrc: Oral  PainSc: 0-No pain      Patients Stated Pain Goal: 5 (04/25/92 5859)  Complications: No apparent anesthesia complications

## 2018-08-21 NOTE — Op Note (Signed)
Operative Note  Preoperative diagnosis:  1.  1.1 cm left renal stone  Postoperative diagnosis: 1.  1.1 cm left renal stone  Procedure(s): 1.  Cystoscopy with left ureteroscopy, holmium laser lithotripsy and left JJ stent placement 2.  Left retrograde pyelogram with intraoperative interpretation of fluoroscopic imaging  Surgeon: Ellison Hughs, MD  Assistants:  None  Anesthesia:  General  Complications:  None  EBL: Less than 5 mL  Specimens: 1.  None  Drains/Catheters: 1.  Left 6 French, 24 cm JJ stent without tether  Intraoperative findings:   1. Nonobstructing left renal stone 2. Solitary left collecting system with no filling defects or dilation involving the left ureter or left renal pelvis seen on retrograde pyelogram  Indication:  Kristen [name] is a 48 y.o. female with a 1.1 cm left renal stone that was seen on CT stone study from 07/10/2018 during an evaluation for ongoing left flank and lower back pain.  She has been consented for the above procedures, voices understanding and wishes to proceed.  Description of procedure:  After informed consent was obtained, the patient was brought to the operating room and general LMA anesthesia was administered. The patient was then placed in the dorsolithotomy position and prepped and draped in the usual sterile fashion. A timeout was performed. A 23 French rigid cystoscope was then inserted into the urethral meatus and advanced into the bladder under direct vision. A complete bladder survey revealed no intravesical pathology.  A 5 French ureteral catheter was then inserted into the left ureteral orifice and a left retrograde pyelogram was obtained, with the findings listed above.  A Glidewire was then advanced through the lumen of the ureteral catheter and up to the left renal pelvis, under fluoroscopic guidance.   The cystoscope was then removed, leaving the wire in place.  A flexible ureteroscope was then advanced over the  wire and into position within the left renal pelvis.  Her 1.1 cm stone was identified in an upper mid calyx.  A 200 m holmium laser was then used to dust the stone.  A complete inspection of the left renal pelvis and all of its calyces revealed no large residual stone fragments.  The Glidewire was then replaced through the lumen of the flexible ureteroscope.  The flexible ureteroscope was then removed under direct vision, with no evidence of large stone burden within the lumen of the left ureter or evidence of ureteral trauma.  A 6 French, 24 cm JJ stent was then placed over the wire and into good position within the left collecting system, confirming placement via fluoroscopy.  The patient's bladder was drained.  She tolerated the procedure well and was transferred to the postanesthesia in stable condition.  Plan: Follow-up on 08/28/2018 for office cystoscopy and stent removal.  She will need a left renal ultrasound 6 weeks postop.

## 2018-08-21 NOTE — Anesthesia Procedure Notes (Signed)
Procedure Name: LMA Insertion Date/Time: 08/21/2018 12:11 PM Performed by: Wanita Chamberlain, CRNA Pre-anesthesia Checklist: Patient identified, Emergency Drugs available, Suction available, Patient being monitored and Timeout performed Patient Re-evaluated:Patient Re-evaluated prior to induction Oxygen Delivery Method: Circle system utilized Preoxygenation: Pre-oxygenation with 100% oxygen Induction Type: IV induction Ventilation: Mask ventilation without difficulty LMA: LMA inserted LMA Size: 4.0 Number of attempts: 1 Placement Confirmation: breath sounds checked- equal and bilateral,  CO2 detector and positive ETCO2 Tube secured with: Tape Dental Injury: Teeth and Oropharynx as per pre-operative assessment

## 2018-08-21 NOTE — Interval H&P Note (Signed)
History and Physical Interval Note:  08/21/2018 11:58 AM  [place]  has presented today for surgery, with the diagnosis of LEFT RENAL STONE.  The various methods of treatment have been discussed with the patient and family. After consideration of risks, benefits and other options for treatment, the patient has consented to  Procedure(s): CYSTOSCOPY LEFT RETROGRADE PYELOGRAM /URETEROSCOPY/HOLMIUM LASER/STENT PLACEMENT (Left) as a surgical intervention.  The patient's history has been reviewed, patient examined, no change in status, stable for surgery.  I have reviewed the patient's chart and labs.  Questions were answered to the patient's satisfaction.     Conception Oms Winter

## 2018-08-21 NOTE — Discharge Instructions (Signed)
[facility] [phone] Post Ureteroscopy With or Without Stent Instructions  Definitions:  Ureter: The duct that transports urine from the kidney to the bladder. Stent:   A plastic hollow tube that is placed into the ureter, from the kidney to the bladder to prevent the ureter from swelling shut.  GENERAL INSTRUCTIONS:  Despite the fact that no skin incisions were used, the area around the ureter and bladder is raw and irritated. The stent is a foreign body which will further irritate the bladder wall. This irritation is manifested by increased frequency of urination, both day and night, and by an increase in the urge to urinate. In some, the urge to urinate is present almost always. Sometimes the urge is strong enough that you may not be able to stop yourself from urinating. The only real cure is to remove the stent and then give time for the bladder wall to heal which can't be done until the danger of the ureter swelling shut has passed, which varies.  You may see some blood in your urine while the stent is in place and a few days afterwards. Do not be alarmed, even if the urine was clear for a while. Get off your feet and drink lots of fluids until clearing occurs. If you start to pass clots or don't improve, call us.  DIET: You may return to your normal diet immediately. Because of the raw surface of your bladder, alcohol, spicy foods, acid type foods and drinks with caffeine may cause irritation or frequency and should be used in moderation. To keep your urine flowing freely and to avoid constipation, drink plenty of fluids during the day ( 8-10 glasses ). Tip: Avoid cranberry juice because it is very acidic.  ACTIVITY: Your physical activity doesn't need to be restricted. However, if you are very active, you may see some blood in your urine. We suggest that you reduce your activity under these circumstances until the bleeding has stopped.  BOWELS: It is important to  keep your bowels regular during the postoperative period. Straining with bowel movements can cause bleeding. A bowel movement every other day is reasonable. Use a mild laxative if needed, such as Milk of Magnesia 2-3 tablespoons, or 2 Dulcolax tablets. Call if you continue to have problems. If you have been taking narcotics for pain, before, during or after your surgery, you may be constipated. Take a laxative if necessary.   MEDICATION: You should resume your pre-surgery medications unless told not to. In addition you will often be given an antibiotic to prevent infection. These should be taken as prescribed until the bottles are finished unless you are having an unusual reaction to one of the drugs.  PROBLEMS YOU SHOULD REPORT TO [place]:  Fevers over 100.5 Fahrenheit.  Heavy bleeding, or clots ( See above notes about blood in urine ).  Inability to urinate.  Drug reactions ( hives, rash, nausea, vomiting, diarrhea ).  Severe burning or pain with urination that is not improving.  FOLLOW-UP: You will need a follow-up appointment to monitor your progress. Call for this appointment at the number listed above. Usually the first appointment will be about three to fourteen days after your surgery.  No Motrin,Aleve,ibuprofen until after 7 pm     Post Anesthesia Home Care Instructions  Activity: Get plenty of rest for the remainder of the day. A responsible individual must stay with you for 24 hours following the procedure.  For the next 24 hours, DO NOT: -Drive a car -  Operate machinery -Drink alcoholic beverages -Take any medication unless instructed by your physician -Make any legal decisions or sign important papers.  Meals: Start with liquid foods such as gelatin or soup. Progress to regular foods as tolerated. Avoid greasy, spicy, heavy foods. If nausea and/or vomiting occur, drink only clear liquids until the nausea and/or vomiting subsides. Call your physician if vomiting  continues.  Special Instructions/Symptoms: Your throat may feel dry or sore from the anesthesia or the breathing tube placed in your throat during surgery. If this causes discomfort, gargle with warm salt water. The discomfort should disappear within 24 hours.  If you had a scopolamine patch placed behind your ear for the management of post- operative nausea and/or vomiting:  1. The medication in the patch is effective for 72 hours, after which it should be removed.  Wrap patch in a tissue and discard in the trash. Wash hands thoroughly with soap and water. 2. You may remove the patch earlier than 72 hours if you experience unpleasant side effects which may include dry mouth, dizziness or visual disturbances. 3. Avoid touching the patch. Wash your hands with soap and water after contact with the patch.     Post Anesthesia Home Care Instructions  Activity: Get plenty of rest for the remainder of the day. A responsible individual must stay with you for 24 hours following the procedure.  For the next 24 hours, DO NOT: -Drive a car -Paediatric nurse -Drink alcoholic beverages -Take any medication unless instructed by your physician -Make any legal decisions or sign important papers.  Meals: Start with liquid foods such as gelatin or soup. Progress to regular foods as tolerated. Avoid greasy, spicy, heavy foods. If nausea and/or vomiting occur, drink only clear liquids until the nausea and/or vomiting subsides. Call your physician if vomiting continues.  Special Instructions/Symptoms: Your throat may feel dry or sore from the anesthesia or the breathing tube placed in your throat during surgery. If this causes discomfort, gargle with warm salt water. The discomfort should disappear within 24 hours.  If you had a scopolamine patch placed behind your ear for the management of post- operative nausea and/or vomiting:  1. The medication in the patch is effective for 72 hours, after which it  should be removed.  Wrap patch in a tissue and discard in the trash. Wash hands thoroughly with soap and water. 2. You may remove the patch earlier than 72 hours if you experience unpleasant side effects which may include dry mouth, dizziness or visual disturbances. 3. Avoid touching the patch. Wash your hands with soap and water after contact with the patch.     Post Anesthesia Home Care Instructions  Activity: Get plenty of rest for the remainder of the day. A responsible individual must stay with you for 24 hours following the procedure.  For the next 24 hours, DO NOT: -Drive a car -Paediatric nurse -Drink alcoholic beverages -Take any medication unless instructed by your physician -Make any legal decisions or sign important papers.  Meals: Start with liquid foods such as gelatin or soup. Progress to regular foods as tolerated. Avoid greasy, spicy, heavy foods. If nausea and/or vomiting occur, drink only clear liquids until the nausea and/or vomiting subsides. Call your physician if vomiting continues.  Special Instructions/Symptoms: Your throat may feel dry or sore from the anesthesia or the breathing tube placed in your throat during surgery. If this causes discomfort, gargle with warm salt water. The discomfort should disappear within 24 hours.  If you  had a scopolamine patch placed behind your ear for the management of post- operative nausea and/or vomiting:  1. The medication in the patch is effective for 72 hours, after which it should be removed.  Wrap patch in a tissue and discard in the trash. Wash hands thoroughly with soap and water. 2. You may remove the patch earlier than 72 hours if you experience unpleasant side effects which may include dry mouth, dizziness or visual disturbances. 3. Avoid touching the patch. Wash your hands with soap and water after contact with the patch.

## 2018-08-21 NOTE — H&P (Signed)
Pre-op H&P  CC: Kidney stone   HPI: Kristen [name] is a 48 year old female found to have an 11 mm left renal stone on CTSS from 07/10/18. The scan was initially ordered to investigate on-going low-back and left flank pain. She has a past medical history of anxiety, bipolar disorder, depression, GERD, hyperlipidemia and kidney stones   The patient was hospitalized at [facility] 2/2 to sepsis from a UTI and elevated lithium levels. She had a Foley placed in the hospital that drained 2.2 L. She states that for the past 1-2 years, she has struggled with incomplete bladder emptying.   -Number of UTIs in the past year: 5  -History of nepohrolithiasis: Long hx of kidney stones, but has never required surgery  -Incontinence: Mild SUI with coughing/laughing/sneezing. She also reports mild UUI  -History of constipation: Hx of chronic constipation but is currently on Miralax  -Gravida/Para: G2P2 (both c-sections)     ALLERGIES: Hypaque Sodium SOLN    MEDICATIONS: Levothyroxine Sodium  Zyrtec 10 mg capsule  Coq10  Cymbalta 60 mg capsule,delayed release  Depakote 500 mg tablet, delayed release  Flonase Allergy Relief  Gemfibrozil 600 mg tablet  Lamictal 200 mg tablet  Lamotrigine 200 mg tablet  Latuda 120 mg tablet  Modafinil 200 mg tablet  Montelukast Sodium  Pantoprazole Sodium 40 mg tablet, delayed release  Pramipexole Dihydrochloride 1 mg tablet  Propantheline Bromide  Risperdal 4 mg tablet  Sudafed  Wellbutrin Xl 150 mg tablet, extended release 24 hr  Xanax 0.5 mg tablet     GU PSH: Endometrial Ablation - about 2004     NON-GU PSH: Bilateral Tubal Ligation - about 1998 Breast Reduction - about 1994 Neck Surgery - about 2003 Sinus Surgery.. - about 67     GU PMH: None   NON-GU PMH: Anxiety Arthritis Bipolar disorder, current episode depressed, mild or moderate severity, unspecified Depression GERD Hypercholesterolemia Hypothyroidism Obesity,  unspecified Stroke/TIA Subserosal leiomyoma of uterus    FAMILY HISTORY: Colon Cancer - Runs in Family Death In The Family Father - Father Diabetes - Mother Hypertension - Grandmother non-Hodgkin's lymphoma - Mother   SOCIAL HISTORY: Marital Status: Married Preferred Language: English; Race: White Current Smoking Status: Patient does not smoke anymore. Has not smoked since 09/12/2007.  Has never drank.  Does not drink caffeine.    REVIEW OF SYSTEMS:    GU Review Female:   Patient denies frequent urination, hard to postpone urination, burning /pain with urination, get up at night to urinate, leakage of urine, stream starts and stops, trouble starting your stream, have to strain to urinate, and being pregnant.  Gastrointestinal (Upper):   Patient reports indigestion/ heartburn. Patient denies nausea and vomiting.  Gastrointestinal (Lower):   Patient reports diarrhea. Patient denies constipation.  Constitutional:   Patient reports fatigue. Patient denies fever, night sweats, and weight loss.  Skin:   Patient denies skin rash/ lesion and itching.  Eyes:   Patient reports blurred vision. Patient denies double vision.  Ears/ Nose/ Throat:   Patient reports sinus problems. Patient denies sore throat.  Hematologic/Lymphatic:   Patient denies swollen glands and easy bruising.  Cardiovascular:   Patient denies leg swelling and chest pains.  Respiratory:   Patient denies cough and shortness of breath.  Endocrine:   Patient reports excessive thirst.   Musculoskeletal:   Patient denies back pain and joint pain.  Neurological:   Patient denies headaches and dizziness.  Psychologic:   Patient reports depression and anxiety.  VITAL SIGNS:      08/13/2018 09:32 AM  Weight 230 lb / 104.33 kg  Height 66 in / 167.64 cm  BP 117/79 mmHg  Pulse 79 /min  Temperature 98.1 F / 36.7 C  BMI 37.1 kg/m   MULTI-SYSTEM PHYSICAL EXAMINATION:    Constitutional: Well-nourished. No physical deformities.  Normally developed. Good grooming.  Neck: Neck symmetrical, not swollen. Normal tracheal position.  Respiratory: No labored breathing, no use of accessory muscles.   Cardiovascular: Normal temperature, normal extremity pulses, no swelling, no varicosities.  Skin: No paleness, no jaundice, no cyanosis. No lesion, no ulcer, no rash.  Neurologic / Psychiatric: Oriented to time, oriented to place, oriented to person. No depression, no anxiety, no agitation.  Gastrointestinal: No mass, no tenderness, no rigidity, non obese abdomen.  Eyes: Normal conjunctivae. Normal eyelids.  Musculoskeletal: Normal gait and station of head and neck.     PAST DATA REVIEWED:  Source Of History:  Patient  Notes:                     CLINICAL DATA: Back/flank pain.     EXAM:  CT ABDOMEN AND PELVIS WITHOUT CONTRAST     TECHNIQUE:  Multidetector CT imaging of the abdomen and pelvis was performed  following the standard protocol without IV contrast.     COMPARISON: 08/05/2011     FINDINGS:  Lower chest: Motion artifact in the lung bases with interlobular  septal thickening and ground-glass opacity bilaterally. Mild  bibasilar atelectasis. No pleural effusion.     Hepatobiliary: No focal liver abnormality is seen. No gallstones,  gallbladder wall thickening, or biliary dilatation.     Pancreas: Unremarkable.     Spleen: Unremarkable.     Adrenals/Urinary Tract: Unremarkable adrenal glands. New 11 mm  calculus in the interpolar left kidney. Punctate right renal calculi  on the prior CT are no longer visualized. Mild bilateral  hydroureteronephrosis, left greater than right, with bilateral  perinephric and periureteral stranding. No ureteral calculi.  Moderately distended bladder.     Stomach/Bowel: The stomach is unremarkable. There is no evidence of  bowel obstruction. Mild left-sided colonic diverticulosis is noted  without evidence of acute diverticulitis. The appendix is  unremarkable.      Vascular/Lymphatic: Normal caliber of the abdominal aorta. No  enlarged lymph nodes.     Reproductive: Chronic nodular appearance of the uterus suggesting  underlying fibroids. No adnexal mass.     Other: No ascites or pneumoperitoneum.     Musculoskeletal: Chronic advanced lower lumbar disc degeneration  involving the last fully formed disc space.     IMPRESSION:  1. Mild bilateral hydroureteronephrosis and perinephric stranding  with moderate bladder distension and no obstructing stone  identified. Correlate for signs of upper tract infection.  2. New 11 mm left renal calculus.  3. Ground-glass opacity and interlobular septal thickening in the  lung bases suggesting edema.        Electronically Signed  By: Logan Bores M.D.  On: 07/10/2018 15:07   PROCEDURES:          Urinalysis w/Scope Dipstick Dipstick Cont'd Micro  Color: Yellow Bilirubin: Neg mg/dL WBC/hpf: 0 - 5/hpf  Appearance: Clear Ketones: Neg mg/dL RBC/hpf: 0 - 2/hpf  Specific Gravity: 1.015 Blood: Neg ery/uL Bacteria: Few (10-25/hpf)  pH: <=5.0 Protein: Neg mg/dL Cystals: NS (Not Seen)  Glucose: Neg mg/dL Urobilinogen: 0.2 mg/dL Casts: NS (Not Seen)    Nitrites: Neg Trichomonas: Not Present    Leukocyte Esterase:  Trace leu/uL Mucous: Present      Epithelial Cells: 0 - 5/hpf      Yeast: NS (Not Seen)      Sperm: Not Present    ASSESSMENT:      ICD-10 Details  1 GU:   Renal calculus - N20.0 Left, 11 left renal stone  2   Incomplete bladder emptying - R39.14   3   Chronic cystitis (w/o hematuria) - N30.20    PLAN:            Medications New Meds: Keflex 500 mg capsule 1 capsule PO Daily   #10  0 Refill(s)    Stop Meds: Zyrtec 10 mg capsule  Discontinue: 08/13/2018  - Reason: The medication cycle was completed.  Cymbalta 60 mg capsule,delayed release  Discontinue: 08/13/2018  - Reason: The medication cycle was completed.  Depakote 500 mg tablet, delayed release  Discontinue: 08/13/2018  - Reason: The  medication cycle was completed.  Gemfibrozil 600 mg tablet  Discontinue: 08/13/2018  - Reason: The medication cycle was completed.  Lamictal 200 mg tablet  Discontinue: 08/13/2018  - Reason: The medication cycle was completed.  Latuda 120 mg tablet  Discontinue: 08/13/2018  - Reason: The medication cycle was completed.  Risperdal 4 mg tablet  Discontinue: 08/13/2018  - Reason: The medication cycle was completed.  Wellbutrin Xl 150 mg tablet, extended release 24 hr  Discontinue: 08/13/2018  - Reason: The medication cycle was completed.  Xanax 0.5 mg tablet  Discontinue: 08/13/2018  - Reason: The medication cycle was completed.            Orders Labs Urine Culture          Schedule Return Visit/Planned Activity: 1 Week - Schedule Surgery          Document Letter(s):  Created for Consuello Masse, MD   Created for Patient: Clinical Summary         Notes:   -She will likely need to have UDS in the future to assess her incomplete bladder emptying which is the likely source of her recurrent UTIs. Urine CS&S pending. I will start her on daily keflex leading up to her ureteroscopy.  -The risks, benefits and alternatives of cystoscopy with LEFT ureteroscopy, laser lithotripsy and ureteral stent placement was discussed the patient. Risks included, but are not limited to: bleeding, urinary tract infection, ureteral injury/avulsion, ureteral stricture formation, retained stone fragments, the possibility that multiple surgeries may be required to treat the stone(s), MI, stroke, PE and the inherent risks of general anesthesia. The patient voices understanding and wishes to proceed.

## 2018-08-21 NOTE — Anesthesia Preprocedure Evaluation (Addendum)
Anesthesia Evaluation  Patient identified by MRN, date of birth, ID band Patient awake    Reviewed: Allergy & Precautions, NPO status , Patient's Chart, lab work & pertinent test results  History of Anesthesia Complications (+) PONV and history of anesthetic complications  Airway Mallampati: II  TM Distance: >3 FB Neck ROM: Limited  Mouth opening: Limited Mouth Opening  Dental  (+) Teeth Intact, Dental Advisory Given   Pulmonary neg pulmonary ROS, former smoker,    Pulmonary exam normal        Cardiovascular negative cardio ROS Normal cardiovascular exam     Neuro/Psych PSYCHIATRIC DISORDERS Anxiety Depression Bipolar Disorder    GI/Hepatic Neg liver ROS, hiatal hernia, GERD  Controlled and Medicated,  Endo/Other  Hypothyroidism   Renal/GU Renal disease (kidney stones)Left renal stone  negative genitourinary   Musculoskeletal negative musculoskeletal ROS (+) Arthritis ,   Abdominal   Peds  Hematology negative hematology ROS (+)   Anesthesia Other Findings Motion Sickness S/P ACDF and Posterior Cervical fusion Extremely limited cervical ROM  Reproductive/Obstetrics                          Anesthesia Physical Anesthesia Plan  ASA: III  Anesthesia Plan: General   Post-op Pain Management:    Induction: Intravenous  PONV Risk Score and Plan: 4 or greater and Ondansetron, Dexamethasone, Midazolam, Scopolamine patch - Pre-op and Treatment may vary due to age or medical condition  Airway Management Planned: LMA  Additional Equipment: None  Intra-op Plan:   Post-operative Plan: Extubation in OR  Informed Consent: I have reviewed the patients History and Physical, chart, labs and discussed the procedure including the risks, benefits and alternatives for the proposed anesthesia with the patient or authorized representative who has indicated his/her understanding and acceptance.      Dental advisory given  Plan Discussed with:   Anesthesia Plan Comments:         Anesthesia Quick Evaluation

## 2018-08-24 ENCOUNTER — Encounter (HOSPITAL_BASED_OUTPATIENT_CLINIC_OR_DEPARTMENT_OTHER): Payer: Self-pay | Admitting: Urology

## 2018-08-24 ENCOUNTER — Other Ambulatory Visit: Payer: Self-pay | Admitting: Physician Assistant

## 2018-08-24 NOTE — Anesthesia Postprocedure Evaluation (Signed)
Anesthesia Post Note  Patient: [name]  Procedure(s) Performed: CYSTOSCOPY LEFT RETROGRADE PYELOGRAM /URETEROSCOPY/HOLMIUM LASER/STENT PLACEMENT (Left Ureter)     Patient location during evaluation: PACU Anesthesia Type: General Level of consciousness: awake and alert Pain management: pain level controlled Vital Signs Assessment: post-procedure vital signs reviewed and stable Respiratory status: spontaneous breathing, nonlabored ventilation, respiratory function stable and patient connected to nasal cannula oxygen Cardiovascular status: blood pressure returned to baseline and stable Postop Assessment: no apparent nausea or vomiting Anesthetic complications: no    Last Vitals:  Vitals:   08/21/18 1400 08/21/18 1455  BP: 136/79 133/70  Pulse: 77 74  Resp: 17 14  Temp:    SpO2: 100% 100%    Last Pain:  Vitals:   08/21/18 1455  TempSrc:   PainSc: 2                  Lidia Collum

## 2018-08-24 NOTE — Telephone Encounter (Signed)
Last note 07/09 says 300 mg wellbutrin xl?

## 2018-08-24 NOTE — Telephone Encounter (Signed)
Kristen [name], pls call her and clarify dose.  This is for 1 dose, my note says another, and on med list is another dose!  Thanks.

## 2018-08-25 NOTE — Telephone Encounter (Signed)
Pt. Returned call. She takes 150 Mg x3 in the morning.

## 2018-08-25 NOTE — Telephone Encounter (Signed)
Left pt. A VM to return my call.

## 2018-09-02 ENCOUNTER — Ambulatory Visit: Payer: 59 | Admitting: Physician Assistant

## 2018-09-04 ENCOUNTER — Other Ambulatory Visit: Payer: Self-pay | Admitting: Physician Assistant

## 2018-09-14 ENCOUNTER — Ambulatory Visit (INDEPENDENT_AMBULATORY_CARE_PROVIDER_SITE_OTHER): Payer: 59 | Admitting: Physician Assistant

## 2018-09-14 ENCOUNTER — Encounter: Payer: Self-pay | Admitting: Physician Assistant

## 2018-09-14 ENCOUNTER — Encounter

## 2018-09-14 ENCOUNTER — Other Ambulatory Visit: Payer: Self-pay

## 2018-09-14 DIAGNOSIS — F411 Generalized anxiety disorder: Secondary | ICD-10-CM | POA: Diagnosis not present

## 2018-09-14 DIAGNOSIS — F319 Bipolar disorder, unspecified: Secondary | ICD-10-CM

## 2018-09-14 DIAGNOSIS — R454 Irritability and anger: Secondary | ICD-10-CM

## 2018-09-14 MED ORDER — QUETIAPINE FUMARATE ER 400 MG PO TB24: 400.0000 mg | ORAL_TABLET | Freq: Every day | ORAL | 1 refills | Status: DC

## 2018-09-14 NOTE — Progress Notes (Signed)
[facility] Check  Patient ID: Kristen [name],  MRN: [id number]  PCP: Manon Hilding, MD  Date of Evaluation: 09/14/2018 Time spent:15 minutes  Chief Complaint:  Chief Complaint    Depression; Follow-up     Virtual Visit via Telephone Note  I connected with patient by a video enabled telemedicine application or telephone, with their informed consent, and verified patient privacy and that I am speaking with the correct person using two identifiers.  I am private, in my home and the patient is home.   I discussed the limitations, risks, security and privacy concerns of performing an evaluation and management service by telephone and the availability of in person appointments. I also discussed with the patient that there may be a patient responsible charge related to this service. The patient expressed understanding and agreed to proceed.   I discussed the assessment and treatment plan with the patient. The patient was provided an opportunity to ask questions and all were answered. The patient agreed with the plan and demonstrated an understanding of the instructions.   The patient was advised to call back or seek an in-person evaluation if the symptoms worsen or if the condition fails to improve as anticipated.  I provided 15 minutes of non-face-to-face time during this encounter.  HISTORY/CURRENT STATUS: HPI For 1 month f/u.  At [facility], I increased Seroquel.  Hasn't been able to tell much difference.  But she's grieving the death of her father, angry at him and her step mother who has treated her awful for the past 35 years, pt has had her own health issues, and her husband has had heart problems and a cardiac cath which was ok.  "I'm a mess.  I'm depressed.  I don't want to go anywhere or do anything. Of course I can't do much with COVID anyway.  I just feel sad all the time."  Has low energy and motivation.  Denies suicidal or homicidal thoughts.  She does complain of being more  irritable.  She is sleeping well.  She does get anxious but it is mostly controlled.  Denies increased energy with decreased need for sleep.  No impulsivity or risky behaviors.  No hallucinations.  No grandiosity.  No increased libido or spending.  Denies dizziness, syncope, seizures, numbness, tingling, tremor, tics, unsteady gait, slurred speech, confusion. Denies muscle or joint pain, stiffness, or dystonia.  Individual Medical History/ Review of Systems: Changes? :Yes had kidney stone surgery (laser, then stent placed, then in a week it was removed). Has been swelling a lot, had echo which was nl, had elevated kidney tests, will be having renal ultrasound in a few weeks.   Past medications for mental health diagnoses include: Prozac, Paxil, Lexapro, Celexa, Zoloft, Viibryd, Effexor, Cymbalta, Risperdal, Latuda, Lamictal, VPA, Li had toxicity, Xanax, Wellbutrin, Abilify, Traz, Strattera, Kapvay, Saphris, Adderall, Vyvanse, Lunesta, Sonata, CBZ, Vraylar, Seroquel  Allergies: Iohexol  Current Medications:  Current Outpatient Medications:  .  acetaminophen (TYLENOL) 325 MG tablet, Take 2 tablets (650 mg total) by mouth every 6 (six) hours as needed for mild pain (or Fever >/= 101)., Disp: 12 tablet, Rfl: 1 .  ALPRAZolam (XANAX) 1 MG tablet, Take 1 tablet (1 mg total) by mouth 3 (three) times daily as needed. for anxiety (Patient taking differently: Take 1 mg by mouth 3 (three) times daily as needed for anxiety. ), Disp: 90 tablet, Rfl: 5 .  atorvastatin (LIPITOR) 10 MG tablet, Take 10 mg by mouth at bedtime. , Disp: , Rfl:  1 .  buPROPion (WELLBUTRIN XL) 150 MG 24 hr tablet, TAKE 3 TABLETS BY MOUTH IN THE MORNING, Disp: 90 tablet, Rfl: 0 .  calcium carbonate (TUMS - DOSED IN MG ELEMENTAL CALCIUM) 500 MG chewable tablet, Chew 2 tablets by mouth 2 (two) times daily as needed for indigestion or heartburn. , Disp: , Rfl:  .  cetirizine (ZYRTEC) 10 MG tablet, Take 10 mg by mouth daily as needed for  allergies. , Disp: , Rfl:  .  Coenzyme Q10 (COQ10 PO), Take by mouth daily., Disp: , Rfl:  .  Ferrous Sulfate (IRON PO), Take by mouth daily., Disp: , Rfl:  .  fluticasone (FLONASE) 50 MCG/ACT nasal spray, Place 2 sprays into both nostrils daily as needed for allergies or rhinitis., Disp: , Rfl:  .  furosemide (LASIX) 40 MG tablet, TAKE 1 TABLET BY MOUTH ONCE DAILY AS NEEDED FOR SWELLING, Disp: , Rfl:  .  lamoTRIgine (LAMICTAL) 200 MG tablet, Take 2.5 tablets (500 mg total) by mouth daily. (Patient taking differently: Take 500 mg by mouth at bedtime. ), Disp: 75 tablet, Rfl: 5 .  levothyroxine (SYNTHROID) 75 MCG tablet, Take 75 mcg by mouth daily before breakfast. , Disp: , Rfl:  .  modafinil (PROVIGIL) 200 MG tablet, Take 1 tablet (200 mg total) by mouth daily., Disp: 30 tablet, Rfl: 1 .  montelukast (SINGULAIR) 10 MG tablet, Take 10 mg by mouth daily. , Disp: , Rfl:  .  Multiple Vitamins-Minerals (GNP MEGA MULTI FOR WOMEN) TABS, Take by mouth daily., Disp: , Rfl:  .  ondansetron (ZOFRAN ODT) 4 MG disintegrating tablet, Take 1 tablet (4 mg total) by mouth every 8 (eight) hours as needed., Disp: 10 tablet, Rfl: 0 .  pantoprazole (PROTONIX) 40 MG tablet, Take 1 tablet (40 mg total) by mouth 2 (two) times daily before a meal. (Patient taking differently: Take 40 mg by mouth 2 (two) times daily before a meal. ), Disp: 60 tablet, Rfl: 1 .  polyethylene glycol (MIRALAX / GLYCOLAX) 17 g packet, Take 17 g by mouth 2 (two) times daily. (Patient taking differently: Take 17 g by mouth at bedtime. 2 capfuls every night), Disp: 60 each, Rfl: 1 .  pramipexole (MIRAPEX) 1 MG tablet, Take 2 tablets (2 mg total) by mouth at bedtime., Disp: 60 tablet, Rfl: 5 .  propranolol (INDERAL) 20 MG tablet, Take 1 tablet (20 mg total) by mouth at bedtime. (Patient taking differently: Take 20 mg by mouth at bedtime. ), Disp: 30 tablet, Rfl: 5 .  tamsulosin (FLOMAX) 0.4 MG CAPS capsule, Take 1 capsule (0.4 mg total) by mouth  daily., Disp: 30 capsule, Rfl: 0 .  cephALEXin (KEFLEX) 500 MG capsule, Take 1 capsule (500 mg total) by mouth 2 (two) times daily. (Patient not taking: Reported on 09/14/2018), Disp: 6 capsule, Rfl: 0 .  HYDROcodone-acetaminophen (NORCO) 5-325 MG tablet, Take 1 tablet by mouth every 4 (four) hours as needed for moderate pain. (Patient not taking: Reported on 09/14/2018), Disp: 20 tablet, Rfl: 0 .  phenazopyridine (PYRIDIUM) 200 MG tablet, Take 1 tablet (200 mg total) by mouth 3 (three) times daily as needed (for pain with urination). (Patient not taking: Reported on 09/14/2018), Disp: 30 tablet, Rfl: 0 .  QUEtiapine (SEROQUEL XR) 400 MG 24 hr tablet, Take 1 tablet (400 mg total) by mouth at bedtime., Disp: 30 tablet, Rfl: 1 Medication Side Effects: none  Family Medical/ Social History: Changes? Yes see above  MENTAL HEALTH EXAM:  There were no vitals taken for  this visit.There is no height or weight on file to calculate BMI.  General Appearance: Unable to assess  Eye Contact:  Unable to assess  Speech:  Clear and Coherent  Volume:  Normal  Mood:  Depressed  Affect:  Unable to assess  Thought Process:  Goal Directed  Orientation:  Full (Time, Place, and Person)  Thought Content: Logical   Suicidal Thoughts:  No  Homicidal Thoughts:  No  Memory:  WNL  Judgement:  Good  Insight:  Good  Psychomotor Activity:  Unable to assess  Concentration:  Concentration: Good  Recall:  Good  Fund of Knowledge: Good  Language: Good  Assets:  Desire for Improvement  ADL's:  Intact  Cognition: WNL  Prognosis:  Good    DIAGNOSES:    ICD-10-CM   1. Bipolar disorder with depression ([place])  F31.9   2. Irritability and anger  R45.4   3. Generalized anxiety disorder  F41.1     Receiving Psychotherapy: No States she is just as well off journaling, which she does every day.   RECOMMENDATIONS:  Continue Wellbutrin XL 450 mg (I made mistake in previous note that it was 300 mg)  Increase Seroquel XR to  400 mg at bedtime. Continue Xanax 1 mg 3 times daily as needed. Continue Lamictal 200 mg, 2.5 pills daily Continue modafinil 200 mg every morning. Continue pramipexole 1 mg, 2 nightly. Consider psychotherapy. Return in 4 to 6 weeks.  [name], PA-C   This record has been created using [employer].  Chart creation errors have been sought, but may not always have been located and corrected. Such creation errors do not reflect on the standard of medical care.

## 2018-09-15 ENCOUNTER — Telehealth: Payer: Self-pay

## 2018-09-15 NOTE — Telephone Encounter (Signed)
Patient left voicemail on nurse line stating she would like to make an apt with Dr. Jenne Campus. Patient stated she was given her name by her insurance company, [employer]. Patient stated she has met her deductible 100%, and is allowed 12 visits. Please call her to schedule [phone].

## 2018-09-22 ENCOUNTER — Other Ambulatory Visit: Payer: Self-pay

## 2018-09-22 ENCOUNTER — Ambulatory Visit (INDEPENDENT_AMBULATORY_CARE_PROVIDER_SITE_OTHER): Payer: 59 | Admitting: Family Medicine

## 2018-09-22 ENCOUNTER — Telehealth: Payer: 59 | Admitting: Family Medicine

## 2018-09-22 DIAGNOSIS — E669 Obesity, unspecified: Secondary | ICD-10-CM | POA: Diagnosis not present

## 2018-09-22 NOTE — Patient Instructions (Addendum)
-   Take your iron supplement along with at least 200 mg vitamin C.  You will also get best absorption of the iron on an empty stomach.  Try to avoid taking iron along with any calcium source.    - Make three lists of vegetables: (1) those you like and eat now; (2) vegetables you won't even consider; and (3) vegetables you might consider trying if they are prepared a certain way.  Continue to eat veg's you currently eat, but from this last list, choose a vegetable to try at least 2 times a week.  Use small amounts of this vegetable, cut small, combined with foods or seasonings you like.    - Recognize that TASTE PREFERENCES ARE LEARNED.  This means it will get easier to choose foods you know are good for you if you are exposed to them enough.    Goals:  1. Walk at least 20 minutes 3 times per week.      In addition, during the day, move at least 5 minutes for every two hours that you sit.   2. Eat at least 3 REAL meals and 1-2 snacks per day.  Aim for no more than 5 hours between eating.  A REAL meal includes at least some protein, some starch, and vegetables and/or fruit.  OR:  Would you serve this to a guest in your home, and call it a meal? 3. Include a vegetable serving at least 6 times per week.    Complete your Goals Sheet provided today, and have with you at follow-up appt via [email or web address] on Tuesday, 8/25 at 1:30 PM.

## 2018-09-22 NOTE — Progress Notes (Signed)
Telehealth Encounter I connected with ZAYNAH CHAWLA (MRN [id number]) on 09/22/2018 by doxy.me video-enabled, HIPAA-compliant telemedicine application, verified that I am speaking with the correct person using two identifiers, and that the patient was in a private environment conducive to confidentiality.  I discussed the limitations of evaluation and management by telemedicine. The patient expressed understanding and agreed to proceed.  Provider was Kennith Center, PhD, RD, LDN, CEDRD Provider(s) located at home during this telehealth encounter; patient was at home  Appt start time: 1400 end time: 1500 (1 hour)  Reason for telehealth visit: Harlym was referred by Harlan Stains, MD for Medical Nutrition Therapy for obesity, E66.9  (BMI >37 on 08/21/18).  Relevant history/background:   Athziri has a strong FH of CVD.  Her lipids are currently medication-controlled.  She has previously used [employer] successfully, but she has never worked with anyone with respect to eating behaviors or emotional eating.  Bipolar D/O represents a challenge to eating behaviors; tends to binge eat when she is depressed.  Has had much stress in past 6 months (including her father's recent death; she was unable to attend funeral b/c of being hospitalized).  She has 2 boys, ages 40 and 53; 27-YO is home, and he does most of the cooking.  Her younger son is in [employer], and will deploy to [place] for 3 yrs on Aug 28.     Assessment: Lillyen identifies herself as both an emotional and a binge eater, e.g., will eat 2 donuts at a time, then throw away the rest, so her husband doesn't know she bought donuts.  Usual eating pattern: 2 meals and 3-5 snacks/day.   Frequent foods and beverages: water, 4 oz orange juice along with Fe supplement; red meat, ham&cheese sandwich, cookie or other baked good daily.   Usual physical activity: None currently.  Was hospitalized in July for 5 days for sepsis related to a UTI.   Sleep:  Estimates she gets around hrs ~5 sleep/night.  Occasionally naps during the day.  Sleeping problems started just within the past year.   24-hr recall:  (Up at 3:30 AM; took levothyroxine and diuretic ~5 AM) B (7 AM)-  1 ham & Amer chs sandw, mustard, 4 oz o.j. Snk ( AM)-  water L ( PM)-   Snk (1 PM)-  1 nutter butter bar Snk (3 PM)-  2 bites pimiento chs D (6 PM)-  3 slc hmbgr, pepperoni, ham pizza, 3 cc cookies, water Snk ( PM)-  water Typical day? Yes.    Intervention: Completed diet and exercise history, established behavioral goals, and determined means of tracking progress.    For recommendations and goals, see Patient Instructions.   Follow-up: Telehealth visit in 2 weeks.    Harrell Niehoff,[name]

## 2018-09-24 ENCOUNTER — Other Ambulatory Visit: Payer: Self-pay | Admitting: Physician Assistant

## 2018-09-24 ENCOUNTER — Telehealth: Payer: Self-pay | Admitting: *Deleted

## 2018-09-24 ENCOUNTER — Telehealth: Payer: 59 | Admitting: Family Medicine

## 2018-09-24 ENCOUNTER — Other Ambulatory Visit: Payer: Self-pay

## 2018-09-24 NOTE — Telephone Encounter (Signed)
Routing to [facility] refill 

## 2018-09-24 NOTE — Telephone Encounter (Signed)
Pt says she had [facility] to send [place] a refill request for Protonix.  Pt says that they told her that they haven't received anything back from [place].  [phone]

## 2018-09-25 MED ORDER — PANTOPRAZOLE SODIUM 40 MG PO TBEC: 40.0000 mg | DELAYED_RELEASE_TABLET | Freq: Two times a day (BID) | ORAL | 5 refills | Status: DC

## 2018-09-25 NOTE — Telephone Encounter (Signed)
EG, please look into the refill for pt in the absence of AB today. Pt and the pharmacy have contacted the office about a Protonix refill. Pt was asked to continue Protonix after her procedure. Please advise.

## 2018-09-25 NOTE — Telephone Encounter (Signed)
Forward to AM for follow-up

## 2018-09-25 NOTE — Telephone Encounter (Signed)
Refilled via refill box.

## 2018-09-28 NOTE — Telephone Encounter (Signed)
Noted. Spoke with pt. She was able to her medication.

## 2018-10-06 ENCOUNTER — Ambulatory Visit (INDEPENDENT_AMBULATORY_CARE_PROVIDER_SITE_OTHER): Payer: 59 | Admitting: Family Medicine

## 2018-10-06 ENCOUNTER — Other Ambulatory Visit: Payer: Self-pay

## 2018-10-06 DIAGNOSIS — E669 Obesity, unspecified: Secondary | ICD-10-CM | POA: Diagnosis not present

## 2018-10-06 NOTE — Patient Instructions (Addendum)
Ways to increase vegetable intake:  - Microwave frozen vegetables.   - Keep on hand frozen veg's at all times.   - Be sure to buy salad foods when you shop.   - Ask your son to grill veg's when he grills meat. - Have salad dressing on the side, and dip your fork into it before you spear the lettuce.   - When you get takeout, add veg's to your meal at home. - Put takeout food on a plate that is no more than 9-10" in diameter.  Save the remainder for another meal.   Goals remain the same:  1. Walk at least 20 minutes 3 times per week.      In addition, move at least 5 minutes for every two hours that you sit during the day.   2. Eat at least 3 REAL meals and 1-2 snacks per day, going no more than 5 hours between eating.  (A REAL meal includes at least some protein, some starch, and vegetables and/or fruit.)   3. Include a vegetable serving at least 6 times per week.    Document your progress on your goals sheet, writing down the number of minutes you walk each time.    - Make three lists of vegetables: (1) those you like and eat now; (2) vegetables you won't even consider; and (3) vegetables you might consider trying if they are prepared a certain way.  Continue to eat veg's you currently eat, but from this last list, choose a vegetable to try at least 2 times a week.  Use small amounts of this vegetable, cut small, combined with foods or seasonings you like.    AT FOLLOW-UP WE WILL DISCUSS CRAVINGS FOR SWEETS.   Follow-up Telehealth visit on Monday, Sept 21 at 11 AM using this link:  [email or web address]

## 2018-10-06 NOTE — Progress Notes (Signed)
Telehealth Encounter I connected with Kristen [name] (MRN [id number]) on 10/06/2018 by doxy.me video-enabled, HIPAA-compliant telemedicine application, verified that I am speaking with the correct person using two identifiers, and that the patient was in a private environment conducive to confidentiality.  I discussed the limitations of evaluation and management by telemedicine. The patient expressed understanding and agreed to proceed.  Provider was Kennith Center, PhD, RD, LDN, CEDRD Provider(s) located at [facility] during this telehealth encounter; patient was at home  Appt start time: 1330 end time: 1430 (1 hour)  Reason for telehealth visit: Kristen [name] was referred by Harlan Stains, MD for Medical Nutrition Therapy for obesity, E66.9  (BMI >37 on 08/21/18).  Relevant history/background: Kristen [name] identifies herself as both an emotional and a binge eater.  Bipolar D/O represents a challenge to eating behaviors; tends to binge eat when she is depressed.  Has had much stress in past 6 months, which has made appetite management more difficult.    Assessment: Kristen [name]'s husband had foot surgery last week, which has required Kristen [name] taking care of him.  (He will be non-weight-bearing 3 wks, and is out of work for 12 wks.)   Usual eating pattern: 3 meals most days.    Progress on goals:  Has obtained a veg serving ~2 X wk vs. goal of 6; has had 3 meals per day most days.  Usual physical activity: Has managed to walk 20 min 2 X wk.  Sleep: Recently doing better, getting 6-7 hrs of sleep/night, probably b/c husband is home (normally works night shift 2 weeks per month).   24-hr recall:  (Up at 6 AM) B (7:30 AM)-  2 boiled eggs, 1c mixed fruit, water Snk ( AM)-  --- L (11:30 PM)-  1 protein shake, 2 slc pork, water Snk ( PM)-  water D (7 PM)-  1/2 c applesauce, 1 oz Cheddar, 1 handful potato chips, 2 tbsp dip, water Snk ( PM)-  --- Typical day? No.  Usually has dinner of protein,  starch (and maybe veg's).   Intervention: Reviewed progress on goals, and confirmed same goals to continue to work on.  Discussed ways to help incorporate more vegetables.  Also discussed concept of willpower, and how it is weakened by fatigue.  (Kristen [name] noted that she has done better with all goals on days she walks in the morning.)    For recommendations and goals, see Patient Instructions.   Follow-up: Telehealth visit in 4 weeks.    Pantelis Elgersma,Kristen [name]

## 2018-10-13 ENCOUNTER — Other Ambulatory Visit: Payer: Self-pay | Admitting: Physician Assistant

## 2018-10-13 NOTE — Telephone Encounter (Signed)
appt 10/26/2018

## 2018-10-20 ENCOUNTER — Ambulatory Visit: Payer: 59 | Admitting: Gastroenterology

## 2018-10-26 ENCOUNTER — Ambulatory Visit: Payer: Medicare Other | Admitting: Physician Assistant

## 2018-11-02 ENCOUNTER — Ambulatory Visit (INDEPENDENT_AMBULATORY_CARE_PROVIDER_SITE_OTHER): Payer: 59 | Admitting: Family Medicine

## 2018-11-02 ENCOUNTER — Other Ambulatory Visit: Payer: Self-pay

## 2018-11-02 ENCOUNTER — Other Ambulatory Visit: Payer: Self-pay | Admitting: Physician Assistant

## 2018-11-02 DIAGNOSIS — E669 Obesity, unspecified: Secondary | ICD-10-CM | POA: Diagnosis not present

## 2018-11-02 NOTE — Patient Instructions (Addendum)
-   Try roasted veg's: Place in a hot (~400 degrees) oven  till browned.  Vegetables that work well roasted: zucchini, squash, onions, garlic, bell pepper, broccoli, cauliflower, mushrooms, and all root vegetables, like carrots, sweet potatoes, and beets.    - Try stir-frying any greens.  Start with a leafy green  such as spinach; get the baby greens (small leaves), and stir-fry in a small amt of olive oil for a very short time.  IAfter you've done that a few times, try mixing in another leafy green.    When you experience a food craving or urge, use either of these tools:  TOOL #1, the three Ds:  1. DELAY: No urge (or emotion) lasts forever.  2. DISTRACT: Find something meaningful on which to focus your attention.  3. DISTANCE: Move away from the trigger to eat and from the food you are craving. Take a walk outside, or simply move to a different room.    (Step #2:  Make a list of activities you can do other than eating.  Suggestion:  Take a walk with a podcast.)    TOOL #2: Use the Food Decisions Algorithm (emailed to you today) when struggling with a food decision.    Continue to journal in your notebook, starting the day with some positive thoughts.    Goals:  1. Walk at least 30 minutes 3 times a week.   2. Have at least one vegetable serving per day.   3. Document your use of the above tools designed to help prevent regretted eating.  Write about your thoughts and feelings.    Follow-up: Telehealth visit in 3 weeks via: [email or web address]

## 2018-11-02 NOTE — Progress Notes (Signed)
Telehealth Encounter I connected with SIEARRA LIGHTER (MRN [id number]) on 11/02/2018 by [email or web address] video-enabled, HIPAA-compliant telemedicine application, verified that I am speaking with the correct person using two identifiers, and that the patient was in a private environment conducive to confidentiality.  I discussed the limitations of evaluation and management by telemedicine. The patient expressed understanding and agreed to proceed.  Provider was Kennith Center, PhD, RD, LDN, CEDRD Provider(s) located at [facility] during this telehealth encounter; patient was at home  Appt start time: 1330 end time: 1430 (1 hour)  Reason for telehealth visit: Ziaire was referred by Consuello Masse, MD for Medical Nutrition Therapy for obesity, E66.9  (BMI >37 on 08/21/18).  Relevant history/background: Elaina identifies herself as both an emotional and a binge eater.  Bipolar D/O represents a challenge to eating behaviors; tends to binge eat when she is depressed.  Has had much stress in past 6 months, which has made appetite management more difficult.    Assessment: Ronelle has made a list of veg's she is willing to try. She has tried some new veg's, including stir-fried Brussels sprouts and steamed cauliflower rice.  Still having cravings for sweets, usually mid- or late-afternoon.  She realizes that this may be related to boredom.  Lipid panel from 9/16: TC 245; TG 552;HDL 45; LDL 106. Patient confirmed this was fasting.   Usual eating pattern: 3 meals most days.  Usually getting at least one veg per day.    Usual physical activity: Walking 26 min 3 X wk.  Sleep: Getting 6-7 hrs of sleep/night.  Sometimes needs to sleep last part of the night in recliner b/c of back pain.    24-hr recall:  (Up at 6:30 AM. took thyroid med) B (8 AM)-  1 pkg inst grits, 5 oz peach yogurt, water Snk ( AM)-  water L (1 PM)-  ---  (fell asleep) Snk ( PM)-  water D (7 PM)-  1 [place] sausage link, 1/3 c  grilled peppers&onions, handful potato chips, water Snk ( PM)-  --- Typical day? No.  Has usually been eating lunch.    Intervention: Reviewed progress on goals, and talked about ways she can improve portion control and decrease sweets.    For recommendations and goals, see Patient Instructions.   Follow-up: Telehealth visit in 3 weeks.  Maleik Vanderzee,[name]

## 2018-11-03 ENCOUNTER — Other Ambulatory Visit: Payer: Self-pay | Admitting: Physician Assistant

## 2018-11-04 ENCOUNTER — Other Ambulatory Visit: Payer: Self-pay | Admitting: Physician Assistant

## 2018-11-14 ENCOUNTER — Other Ambulatory Visit: Payer: Self-pay | Admitting: Physician Assistant

## 2018-11-16 ENCOUNTER — Telehealth: Payer: Self-pay | Admitting: Physician Assistant

## 2018-11-16 ENCOUNTER — Other Ambulatory Visit: Payer: Self-pay

## 2018-11-16 NOTE — Telephone Encounter (Signed)
Patient called today to say that she called on Thursday and her appointment was changed with teresa again for third time and at that time she told us she was out of her seroquel. She called again today to say that she still has not received the seroquel and that now she needs refills on all her medicines. Her lamictal, propanolol and her xanax . Her next appt is 10/29 to the [facility] in [place]

## 2018-11-16 NOTE — Telephone Encounter (Signed)
seroquel already submitted, others to be sent today

## 2018-11-17 ENCOUNTER — Telehealth: Payer: Self-pay | Admitting: Physician Assistant

## 2018-11-17 ENCOUNTER — Other Ambulatory Visit: Payer: Self-pay | Admitting: Physician Assistant

## 2018-11-17 MED ORDER — ALPRAZOLAM 1 MG PO TABS: 1.0000 mg | ORAL_TABLET | Freq: Three times a day (TID) | ORAL | 0 refills | Status: DC | PRN

## 2018-11-17 MED ORDER — LAMOTRIGINE 200 MG PO TABS: ORAL_TABLET | ORAL | 0 refills | Status: DC

## 2018-11-17 MED ORDER — PROPRANOLOL HCL 20 MG PO TABS: 20.0000 mg | ORAL_TABLET | Freq: Every day | ORAL | 1 refills | Status: DC

## 2018-11-17 NOTE — Telephone Encounter (Signed)
Patient stated she called the pharmacy [facility] in [place]) they stated they have not recv'd any refills from [place] for Xanax,Lamictal and Propranolol she stated she's had to reschedule due to provider being out and she needs her medication

## 2018-11-17 NOTE — Telephone Encounter (Signed)
Refills submitted by Helene Kelp on 11/17/2018

## 2018-11-19 ENCOUNTER — Other Ambulatory Visit: Payer: Self-pay | Admitting: Physician Assistant

## 2018-11-19 DIAGNOSIS — R490 Dysphonia: Secondary | ICD-10-CM | POA: Insufficient documentation

## 2018-11-19 DIAGNOSIS — K219 Gastro-esophageal reflux disease without esophagitis: Secondary | ICD-10-CM | POA: Insufficient documentation

## 2018-11-20 ENCOUNTER — Ambulatory Visit: Payer: Medicare Other | Admitting: Physician Assistant

## 2018-11-23 ENCOUNTER — Other Ambulatory Visit: Payer: Self-pay

## 2018-11-23 ENCOUNTER — Ambulatory Visit (INDEPENDENT_AMBULATORY_CARE_PROVIDER_SITE_OTHER): Payer: 59 | Admitting: Family Medicine

## 2018-11-23 DIAGNOSIS — E669 Obesity, unspecified: Secondary | ICD-10-CM

## 2018-11-23 NOTE — Progress Notes (Signed)
Telehealth Encounter I connected with Kristen [name] (MRN [id number]) on 11/23/2018 by [email or web address] video-enabled, HIPAA-compliant telemedicine application, verified that I am speaking with the correct person using two identifiers, and that the patient was in a private environment conducive to confidentiality.  I discussed the limitations of evaluation and management by telemedicine. The patient expressed understanding and agreed to proceed.  Provider was Kennith Center, PhD, RD, LDN, CEDRD Provider(s) located at home during this telehealth encounter; patient was at home  Appt start time: 1500 end time: 1600 (1 hour)  Reason for telehealth visit: [name] was referred by Consuello Masse, MD for Medical Nutrition Therapy for obesity, E66.9  (BMI >37 on 08/21/18).  Relevant history/background: [name] identifies herself as both an emotional and a binge eater.  Bipolar D/O represents a challenge to eating behaviors; tends to binge eat when she is depressed.  Has had much stress in past 6 months, which has made appetite management more difficult.    Assessment: [name] spent a week with her son in [place] recently.  She said he "does not cook," so they ate out a lot, and she just didn't give any thought to making nutritious foods. At a doctor's appt on 9/16, weight check indicated she had lost 10 lb (down to 235 lb).  She has not tried any more new veg's.  [name] sounded eager to get back to consistent walking; said she felt so much better when she was doing so.   Still having daily cravings for sweets, usually mid- or late-afternoon.   Lipid panel from 9/16: TC 245; TG 552;HDL 45; LDL 106. Patient confirmed this was fasting.   Usual eating pattern: 3 meals most days.  Usually getting at least one veg per day.    Usual physical activity: Walked 29 min 2 X last week.  Got off routine when traveling.   Sleep: Getting ~6 hrs of sleep/night.  Taking Zzzquil nightly, but doesn't stay asleep.  Still sometimes needs to  sleep last part of the night in recliner b/c of back pain.    24-hr recall:  (Up at 5 AM; thyroid med) B (7 AM)-  1 [place] egg, bacon, chs biscuit, 12 oz Coke  Snk ( AM)-  --- L (1 PM)-  1 1/2 c chili beans, 1 T shrd chs, 1 T sour crm, water Snk ( PM)-  --- D (7 PM)-  6" stk & chs sub, 1/2 c fries, water Snk ( PM)-  --- Typical day? Yes.  Had restaurant/takeout foods twice last week, not counting yesterday.   Intervention: Considered progress on goals, and discussed "backsliding" in both food choices and walking.  Used Teach-back in review of how to use tools previously discussed for dealing with craving sweets.  For recommendations and goals, see Patient Instructions.   Follow-up: Telehealth visit in 4 weeks.  [name],[name]

## 2018-11-23 NOTE — Patient Instructions (Addendum)
Reiterated from last appointment:  - Try roasted veg's: Place in a hot (~400 degrees) oven till browned.  Vegetables that work well roasted: zucchini, squash, onions, garlic, bell pepper, broccoli, cauliflower, mushrooms, and all root vegetables, like carrots, sweet potatoes, and beets.   - Try stir-frying any greens.  Start with a leafy green such as spinach; get the baby greens (small leaves), and stir-fry in a small amt of olive oil for a very short time.  After you've done that a few times, try mixing in another leafy green.   - Make a to-do list for those times you need an activity to distract from a food craving.    When you experience a food craving or urge, use either of these tools:  TOOL #1, the three Ds:  1. DELAY: No urge (or emotion) lasts forever.  2. DISTRACT: Find something meaningful on which to focus your attention.  3. DISTANCE: Move away from the trigger to eat and from the food you are craving. Take a walk outside, or simply move to a different room.   (Step #2:  Make a list of activities you can do other than eating.  Suggestion:  Take a walk with a podcast.)    TOOL #2, Food Decisions Algorithm when struggling with a food decision.   Continue to journal in your notebook, starting the day with some positive thoughts.   Goals:  1. Walk at least 30 minutes 3 times a week.   2. Have at least one vegetable serving per day.   3. Document your use of the above tools designed to help prevent regretted eating.  Write about your thoughts and feelings.     Follow-up: Telehealth visit on November 9 at 3 PM via: [email or web address]

## 2018-12-04 ENCOUNTER — Encounter: Payer: Self-pay | Admitting: Gastroenterology

## 2018-12-04 ENCOUNTER — Telehealth: Payer: Self-pay | Admitting: Gastroenterology

## 2018-12-04 ENCOUNTER — Other Ambulatory Visit: Payer: Self-pay

## 2018-12-04 ENCOUNTER — Ambulatory Visit (INDEPENDENT_AMBULATORY_CARE_PROVIDER_SITE_OTHER): Payer: 59 | Admitting: Gastroenterology

## 2018-12-04 VITALS — BP 122/73 | HR 82 | Temp 97.1°F | Ht 66.0 in | Wt 241.4 lb

## 2018-12-04 DIAGNOSIS — K219 Gastro-esophageal reflux disease without esophagitis: Secondary | ICD-10-CM

## 2018-12-04 DIAGNOSIS — Z800 Family history of malignant neoplasm of digestive organs: Secondary | ICD-10-CM

## 2018-12-04 DIAGNOSIS — D509 Iron deficiency anemia, unspecified: Secondary | ICD-10-CM | POA: Diagnosis not present

## 2018-12-04 DIAGNOSIS — R140 Abdominal distension (gaseous): Secondary | ICD-10-CM | POA: Diagnosis not present

## 2018-12-04 DIAGNOSIS — K5909 Other constipation: Secondary | ICD-10-CM

## 2018-12-04 MED ORDER — LINACLOTIDE 145 MCG PO CAPS: 145.0000 ug | ORAL_CAPSULE | Freq: Every day | ORAL | 3 refills | Status: DC

## 2018-12-04 NOTE — Assessment & Plan Note (Signed)
Follow-up on anemia.  Recheck labs.

## 2018-12-04 NOTE — Assessment & Plan Note (Signed)
Likely in part due to constipation.  Encouraged ongoing weight loss efforts.  Follow low FODMAP diet, slowly add 1 high FODMAP item at a time to see which are tolerated.  Return to the office in 3 months, call sooner if needed.

## 2018-12-04 NOTE — Telephone Encounter (Signed)
Please let patient know that Dr. Gala Romney is in agreement with next colonoscopy being in June 2025 given personal history of colon polyps and family history of colon cancer.  Please change NIC for TCS to be 07/2023.

## 2018-12-04 NOTE — Assessment & Plan Note (Signed)
Typical symptoms well controlled.  She complains of hoarseness since time of her EGD in June.  ENT evaluation completed with fiberoptic study revealing some mucus on the vocal cords but otherwise no significant findings.  Possibly some element of GERD.  Patient has chronic year-round allergies, on multiple medications for this but some breakthrough symptoms.  Continue pantoprazole twice daily for now.  Return to the office in 6 months.  Of note patient had a gastric ulcer at time of endoscopy which was healing.  Biopsies negative.  Avoid NSAIDs.

## 2018-12-04 NOTE — Patient Instructions (Addendum)
1. Please have labs to follow up on your anemia.  2. You can continue miralax as needed but I would like for you to try Linzess daily before breakfast. We have given samples of 136mcg to try and RX sent to pharmacy. There is a lower and higher dose (6mcg and 215mcg) so if we need to make some adjustments, let me know.  3. Continue pantoprazole twice daily before a meal.  4. Follow guidelines for FODMAP diet as below. Try to consume low FODMAP foods. You can add one HIGH FODMAP food back every few days to try and determine which may be adding to your bloating and gas.  5. Return to the office in 3 months, call sooner if needed.    Low-FODMAP Eating Plan  FODMAPs (fermentable oligosaccharides, disaccharides, monosaccharides, and polyols) are sugars that are hard for some people to digest. A low-FODMAP eating plan may help some people who have bowel (intestinal) diseases to manage their symptoms. This meal plan can be complicated to follow. Work with a diet and nutrition specialist (dietitian) to make a low-FODMAP eating plan that is right for you. A dietitian can make sure that you get enough nutrition from this diet. What are tips for following this plan? Reading food labels  Check labels for hidden FODMAPs such as: ? High-fructose syrup. ? Honey. ? Agave. ? Natural fruit flavors. ? Onion or garlic powder.  Choose low-FODMAP foods that contain 3-4 grams of fiber per serving.  Check food labels for serving sizes. Eat only one serving at a time to make sure FODMAP levels stay low. Meal planning  Follow a low-FODMAP eating plan for up to 6 weeks, or as told by your health care provider or dietitian.  To follow the eating plan: 1. Eliminate high-FODMAP foods from your diet completely. 2. Gradually reintroduce high-FODMAP foods into your diet one at a time. Most people should wait a few days after introducing one high-FODMAP food before they introduce the next high-FODMAP food. Your  dietitian can recommend how quickly you may reintroduce foods. 3. Keep a daily record of what you eat and drink, and make note of any symptoms that you have after eating. 4. Review your daily record with a dietitian regularly. Your dietitian can help you identify which foods you can eat and which foods you should avoid. General tips  Drink enough fluid each day to keep your urine pale yellow.  Avoid processed foods. These often have added sugar and may be high in FODMAPs.  Avoid most dairy products, whole grains, and sweeteners.  Work with a dietitian to make sure you get enough fiber in your diet. Recommended foods Grains  Gluten-free grains, such as rice, oats, buckwheat, quinoa, corn, polenta, and millet. Gluten-free pasta, bread, or cereal. Rice noodles. Corn tortillas. Vegetables  Eggplant, zucchini, cucumber, peppers, green beans, Brussels sprouts, bean sprouts, lettuce, arugula, kale, Swiss chard, spinach, collard greens, bok choy, summer squash, potato, and tomato. Limited amounts of corn, carrot, and sweet potato. Green parts of scallions. Fruits  Bananas, oranges, lemons, limes, blueberries, raspberries, strawberries, grapes, cantaloupe, honeydew melon, kiwi, papaya, passion fruit, and pineapple. Limited amounts of dried cranberries, banana chips, and shredded coconut. Dairy  Lactose-free milk, yogurt, and kefir. Lactose-free cottage cheese and ice cream. Non-dairy milks, such as almond, coconut, hemp, and rice milk. Yogurts made of non-dairy milks. Limited amounts of goat cheese, brie, mozzarella, parmesan, swiss, and other hard cheeses. Meats and other protein foods  Unseasoned beef, pork, poultry, or fish. Eggs.  Berniece Salines. Tofu (firm) and tempeh. Limited amounts of nuts and seeds, such as almonds, walnuts, [place] nuts, pecans, peanuts, pumpkin seeds, chia seeds, and sunflower seeds. Fats and oils  Butter-free spreads. Vegetable oils, such as olive, canola, and sunflower  oil. Seasoning and other foods  Artificial sweeteners with names that do not end in "ol" such as aspartame, saccharine, and stevia. Maple syrup, white table sugar, raw sugar, brown sugar, and molasses. Fresh basil, coriander, parsley, rosemary, and thyme. Beverages  Water and mineral water. Sugar-sweetened soft drinks. Small amounts of orange juice or cranberry juice. Black and green tea. Most dry wines. Coffee. This may not be a complete list of low-FODMAP foods. Talk with your dietitian for more information. Foods to avoid Grains  Wheat, including kamut, durum, and semolina. Barley and bulgur. Couscous. Wheat-based cereals. Wheat noodles, bread, crackers, and pastries. Vegetables  Chicory root, artichoke, asparagus, cabbage, snow peas, sugar snap peas, mushrooms, and cauliflower. Onions, garlic, leeks, and the white part of scallions. Fruits  Fresh, dried, and juiced forms of apple, pear, watermelon, peach, plum, cherries, apricots, blackberries, boysenberries, figs, nectarines, and mango. Avocado. Dairy  Milk, yogurt, ice cream, and soft cheese. Cream and sour cream. Milk-based sauces. Custard. Meats and other protein foods  Fried or fatty meat. Sausage. Cashews and pistachios. Soybeans, baked beans, black beans, chickpeas, kidney beans, fava beans, navy beans, lentils, and split peas. Seasoning and other foods  Any sugar-free gum or candy. Foods that contain artificial sweeteners such as sorbitol, mannitol, isomalt, or xylitol. Foods that contain honey, high-fructose corn syrup, or agave. Bouillon, vegetable stock, beef stock, and chicken stock. Garlic and onion powder. Condiments made with onion, such as hummus, chutney, pickles, relish, salad dressing, and salsa. Tomato paste. Beverages  Chicory-based drinks. Coffee substitutes. Chamomile tea. Fennel tea. Sweet or fortified wines such as port or sherry. Diet soft drinks made with isomalt, mannitol, maltitol, sorbitol, or xylitol.  Apple, pear, and mango juice. Juices with high-fructose corn syrup. This may not be a complete list of high-FODMAP foods. Talk with your dietitian to discuss what dietary choices are best for you.  Summary  A low-FODMAP eating plan is a short-term diet that eliminates FODMAPs from your diet to help ease symptoms of certain bowel diseases.  The eating plan usually lasts up to 6 weeks. After that, high-FODMAP foods are restarted gradually, one at a time, so you can find out which may be causing symptoms.  A low-FODMAP eating plan can be complicated. It is best to work with a dietitian who has experience with this type of plan. This information is not intended to replace advice given to you by your health care provider. Make sure you discuss any questions you have with your health care provider. Document Released: 09/24/2016 Document Revised: 01/10/2017 Document Reviewed: 09/24/2016 Elsevier Patient Education  2020 [employer].

## 2018-12-04 NOTE — Progress Notes (Signed)
Primary Care Physician: Manon Hilding, MD  Primary Gastroenterologist:  Garfield Cornea, MD   Chief Complaint  Patient presents with  . Gas  . Bloated    HPI: Kristen [name] is a 48 y.o. female here for follow up of EGD/TCS.  Patient seen during hospitalization back in May, presented with confusion.  Had been diagnosed with pyelonephritis 3 days prior, while in the ED noted to have elevated lithium as well.  Worsening renal function.  CT renal protocol showed mild bilateral hydroureteronephrosis, bladder distention but no obstructing stones.  She had a new 11 mm left renal stone.  She was found to have a hemoglobin of 10.5 several days prior to admission.  Hemoglobin 8.4 during hospitalization.  Heme positive stool.  Iron and iron sats low but normal ferritin.  Directly with chronic constipation.  Remote history of laxative abuse.  History of taking Advil about once per week, single dose of Goody powders.  Family history significant for colon cancer, father at age greater than 10.  Paternal aunt diagnosed with colon cancer in her 17s.  And paternal grandfather with colon cancer as well.  Patient underwent EGD and colonoscopy back in June 2020.  She had a small hiatal hernia, erythematous mucosa in the stomach with a healing gastric ulcer.  Biopsies negative.  Scattered diverticula in the colon, nonbleeding internal hemorrhoids, 5 mm splenic flexure tubular adenoma removed.  Patient has been advised to have a 7-year follow-up colonoscopy based on current guidelines.  She prefers 5-year follow-up given significant family history of colon cancer as outlined.  Patient states she has had hoarseness since her endoscopy.  Denies having them beforehand.  She has significant allergies and is on multiple medications.  She has seen ENT, Dr. Redmond Baseman, examined no significant abnormalities.  She had some mucus on her vocal cords.  He felt like some of her symptoms may be contributed by reflux but noted  that she is already on pantoprazole 40 mg twice daily.  He suggested seeing speech therapy but patient declined.  Patient feels like some of her symptoms could be related to her allergies.  Her predominant complaint is chronic constipation and abdominal bloating and gas which is more of a newer symptom.  She is working with a dietitian monthly to try to lose some weight.  She has gained 20 pounds in the past 5 months.  Her reflux is well controlled on pantoprazole 40 mg twice daily.  She is doing a lot better in that category.  She denies any NSAID or aspirin use, also states she really did not use on a regular basis.  She takes Gas-X all day long for bloating and gas.  She takes MiraLAX 2-3 times per week.  She has a bowel movement about twice a week, stools are hard.  She states she has a prior history laxative abuse with Ex-Lax, movements correctable type of laxatives.  Denies any melena or rectal bleeding.  Since we last saw her she has followed up with a urologist and required stone removal and stenting.   CT renal stone study without contrast May 2020: Mild bilateral hydroureteronephrosis, perinephric stranding with moderate bladder distention, no obstructing stone.  New 11 mm left renal calculus.  Chronic nodular appearance of the uterus suggesting underlying fibroids.  Liver and spleen unremarkable.  Pancreas unremarkable.      Current Outpatient Medications  Medication Sig Dispense Refill  . acetaminophen (TYLENOL) 325 MG tablet Take 2 tablets (650 mg  total) by mouth every 6 (six) hours as needed for mild pain (or Fever >/= 101). 12 tablet 1  . ALPRAZolam (XANAX) 1 MG tablet Take 1 tablet (1 mg total) by mouth 3 (three) times daily as needed. for anxiety 90 tablet 0  . ascorbic acid (VITAMIN C) 500 MG tablet Take 500 mg by mouth daily.    Marland Kitchen atorvastatin (LIPITOR) 10 MG tablet Take 10 mg by mouth at bedtime.   1  . B Complex-C (SUPER B COMPLEX PO) Take by mouth daily.    Marland Kitchen buPROPion  (WELLBUTRIN XL) 150 MG 24 hr tablet TAKE 3 TABLETS BY MOUTH IN THE MORNING 90 tablet 2  . calcium carbonate (TUMS - DOSED IN MG ELEMENTAL CALCIUM) 500 MG chewable tablet Chew 2 tablets by mouth 2 (two) times daily as needed for indigestion or heartburn.     . cetirizine (ZYRTEC) 10 MG tablet Take 10 mg by mouth daily as needed for allergies.     . Coenzyme Q10 (COQ10 PO) Take by mouth daily.    Marland Kitchen CRANBERRY EXTRACT PO Take by mouth 2 (two) times daily.    . fluticasone (FLONASE) 50 MCG/ACT nasal spray Place 2 sprays into both nostrils daily as needed for allergies or rhinitis.    . furosemide (LASIX) 40 MG tablet Take 40 mg by mouth daily.     Marland Kitchen lamoTRIgine (LAMICTAL) 200 MG tablet TAKE 2 & 1/2 (TWO & ONE-HALF) TABLETS BY MOUTH ONCE DAILY 75 tablet 0  . levothyroxine (SYNTHROID) 75 MCG tablet Take 75 mcg by mouth daily before breakfast.     . modafinil (PROVIGIL) 200 MG tablet Take 1 tablet by mouth once daily 30 tablet 1  . montelukast (SINGULAIR) 10 MG tablet Take 10 mg by mouth daily.     . ondansetron (ZOFRAN ODT) 4 MG disintegrating tablet Take 1 tablet (4 mg total) by mouth every 8 (eight) hours as needed. 10 tablet 0  . OVER THE COUNTER MEDICATION D-Mannose daily    . pantoprazole (PROTONIX) 40 MG tablet Take 1 tablet (40 mg total) by mouth 2 (two) times daily before a meal. 60 tablet 5  . polyethylene glycol (MIRALAX / GLYCOLAX) 17 g packet Take 17 g by mouth 2 (two) times daily. (Patient taking differently: Take 17 g by mouth daily as needed. ) 60 each 1  . pramipexole (MIRAPEX) 1 MG tablet Take 2 tablets (2 mg total) by mouth at bedtime. 60 tablet 5  . propranolol (INDERAL) 20 MG tablet Take 1 tablet (20 mg total) by mouth at bedtime. 30 tablet 1  . QUEtiapine (SEROQUEL XR) 400 MG 24 hr tablet TAKE 1 TABLET BY MOUTH AT BEDTIME 30 tablet 0  . tamsulosin (FLOMAX) 0.4 MG CAPS capsule Take 1 capsule (0.4 mg total) by mouth daily. 30 capsule 0  . Ferrous Sulfate (IRON PO) Take by mouth daily.     Marland Kitchen HYDROcodone-acetaminophen (NORCO) 5-325 MG tablet Take 1 tablet by mouth every 4 (four) hours as needed for moderate pain. (Patient not taking: Reported on 09/14/2018) 20 tablet 0  . Multiple Vitamins-Minerals (GNP MEGA MULTI FOR WOMEN) TABS Take by mouth daily.     No current facility-administered medications for this visit.     Allergies as of 12/04/2018 - Review Complete 12/04/2018  Allergen Reaction Noted  . Iohexol Hives 11/03/2007    ROS:  General: Negative for anorexia, weight loss, fever, chills, fatigue, weakness.  See HPI ENT: Negative for hoarseness, difficulty swallowing , nasal congestion. CV: Negative for  chest pain, angina, palpitations, dyspnea on exertion, peripheral edema.  Respiratory: Negative for dyspnea at rest, dyspnea on exertion, cough, sputum, wheezing.  GI: See history of present illness. GU:  Negative for dysuria, hematuria, urinary incontinence, urinary frequency, nocturnal urination.  Endo: Negative for unusual weight change.  See HPI   Physical Examination:   BP 122/73   Pulse 82   Temp (!) 97.1 F (36.2 C) (Temporal)   Ht 5\' 6"  (1.676 m)   Wt 241 lb 6.4 oz (109.5 kg)   BMI 38.96 kg/m   General: Well-nourished, well-developed in no acute distress.  Eyes: No icterus. Mouth: Oropharyngeal mucosa moist and pink , no lesions erythema or exudate. Lungs: Clear to auscultation bilaterally.  Heart: Regular rate and rhythm, no murmurs rubs or gallops.  Abdomen: Bowel sounds are normal, obese, soft, nondistended, no hepatosplenomegaly or masses, no abdominal bruits or hernia , no rebound or guarding.   Extremities: Trace lower extremity edema. No clubbing or deformities. Neuro: Alert and oriented x 4   Skin: Warm and dry, no jaundice.   Psych: Alert and cooperative, normal mood and affect.  Labs:  Lab Results  Component Value Date   CREATININE 1.10 (H) 07/12/2018   BUN 9 07/12/2018   NA 147 (H) 07/12/2018   K 3.2 (L) 07/12/2018   CL 116 (H)  07/12/2018   CO2 17 (L) 07/12/2018   Lab Results  Component Value Date   ALT 19 07/11/2018   AST 18 07/11/2018   ALKPHOS 176 (H) 07/11/2018   BILITOT 0.5 07/11/2018   Lab Results  Component Value Date   WBC 16.6 (H) 07/12/2018   HGB 8.4 (L) 07/12/2018   HCT 28.0 (L) 07/12/2018   MCV 94.0 07/12/2018   PLT 474 (H) 07/12/2018   Lab Results  Component Value Date   IRON 14 (L) 07/10/2018   TIBC 252 07/10/2018   FERRITIN 289 07/10/2018   Lab Results  Component Value Date   VITAMINB12 301 07/10/2018   Lab Results  Component Value Date   FOLATE 8.9 07/10/2018    Imaging Studies: No results found.

## 2018-12-04 NOTE — Assessment & Plan Note (Signed)
Family history colon cancer, father at age greater than 13, paternal grandfather at age greater than 15, paternal aunt at 48 years old.  Patient has personal history of single tubular adenoma as outlined above.  Patient requesting 5-year follow-up colonoscopy.  To discuss with Dr. Gala Romney.  Encouraged yearly ifobt.

## 2018-12-04 NOTE — Assessment & Plan Note (Signed)
Inadequately managed.  Likely contributing to her bloating.  Start Linzess 145 mcg daily on empty stomach.  Samples and prescription provided.  Patient is aware to call if the need to adjust the dose.  Can continue to use MiraLAX on a as needed basis if needed.  Return to the office in 3 months.

## 2018-12-07 NOTE — Telephone Encounter (Signed)
Noted. Pt is aware of RMR's and LSL's recommendations of TCS 07/2023.

## 2018-12-08 NOTE — Telephone Encounter (Signed)
CHANGED NIC

## 2018-12-10 ENCOUNTER — Ambulatory Visit: Payer: Medicare Other | Admitting: Physician Assistant

## 2018-12-13 ENCOUNTER — Other Ambulatory Visit: Payer: Self-pay | Admitting: Physician Assistant

## 2018-12-21 ENCOUNTER — Telehealth: Payer: 59 | Admitting: Family Medicine

## 2018-12-28 ENCOUNTER — Other Ambulatory Visit: Payer: Self-pay | Admitting: Physician Assistant

## 2018-12-28 NOTE — Telephone Encounter (Signed)
Apt 11/24

## 2019-01-04 ENCOUNTER — Other Ambulatory Visit: Payer: Self-pay | Admitting: Physician Assistant

## 2019-01-04 NOTE — Telephone Encounter (Signed)
Apt tomorrow 11/24

## 2019-01-05 ENCOUNTER — Ambulatory Visit (INDEPENDENT_AMBULATORY_CARE_PROVIDER_SITE_OTHER): Payer: 59 | Admitting: Physician Assistant

## 2019-01-05 ENCOUNTER — Encounter: Payer: Self-pay | Admitting: Physician Assistant

## 2019-01-05 ENCOUNTER — Other Ambulatory Visit: Payer: Self-pay

## 2019-01-05 DIAGNOSIS — R251 Tremor, unspecified: Secondary | ICD-10-CM | POA: Diagnosis not present

## 2019-01-05 DIAGNOSIS — F411 Generalized anxiety disorder: Secondary | ICD-10-CM | POA: Diagnosis not present

## 2019-01-05 DIAGNOSIS — R454 Irritability and anger: Secondary | ICD-10-CM | POA: Diagnosis not present

## 2019-01-05 DIAGNOSIS — F319 Bipolar disorder, unspecified: Secondary | ICD-10-CM | POA: Diagnosis not present

## 2019-01-05 MED ORDER — PROPRANOLOL HCL 20 MG PO TABS: 20.0000 mg | ORAL_TABLET | Freq: Every day | ORAL | 5 refills | Status: DC

## 2019-01-05 MED ORDER — ALPRAZOLAM 1 MG PO TABS: 1.0000 mg | ORAL_TABLET | Freq: Three times a day (TID) | ORAL | 5 refills | Status: DC | PRN

## 2019-01-05 MED ORDER — QUETIAPINE FUMARATE ER 300 MG PO TB24: 600.0000 mg | ORAL_TABLET | Freq: Every day | ORAL | 1 refills | Status: DC

## 2019-01-05 MED ORDER — LAMOTRIGINE 200 MG PO TABS: ORAL_TABLET | ORAL | 5 refills | Status: DC

## 2019-01-05 MED ORDER — PRAMIPEXOLE DIHYDROCHLORIDE 1 MG PO TABS: 2.0000 mg | ORAL_TABLET | Freq: Every day | ORAL | 5 refills | Status: DC

## 2019-01-05 NOTE — Progress Notes (Signed)
[facility]  Patient ID: Kristen [name],  MRN: [id number]  PCP: Manon Hilding, MD  Date of Evaluation: 01/05/2019 Time spent:30 minutes  Chief Complaint:  Chief Complaint    Depression; Follow-up     Virtual Visit via Telephone Note  I connected with patient by a video enabled telemedicine application or telephone, with their informed consent, and verified patient privacy and that I am speaking with the correct person using two identifiers.  I am private, in my office and the patient is home.  I discussed the limitations, risks, security and privacy concerns of performing an evaluation and management service by telephone and the availability of in person appointments. I also discussed with the patient that there may be a patient responsible charge related to this service. The patient expressed understanding and agreed to proceed.   I discussed the assessment and treatment plan with the patient. The patient was provided an opportunity to ask questions and all were answered. The patient agreed with the plan and demonstrated an understanding of the instructions.   The patient was advised to call back or seek an in-person evaluation if the symptoms worsen or if the condition fails to improve as anticipated.  I provided 30 minutes of non-face-to-face time during this encounter.  HISTORY/CURRENT STATUS: HPI Not doing well.  C/o depression for a month or 6 weeks.  This is the first holiday season since her dad died.  Her Mom is in the hospital now, d/t multiple falls, vertigo, and other neuro issues.  She can't see her b/c of COVID. Her oldest son is in the [employer] in [place], and can't come home. "All I want to do is sleep. I do get up and take a shower and brush my teeth. But I want to go to bed and not be around anybody.  I'm not going to my husband's family's for Thanksgiving.  I am not suicidal.  I do not want to hurt myself, but I just do not care about anything.  I just  want to stay home and sleep most of the time."  She does not do anything for fun.   Patient denies increased energy with decreased need for sleep, no increased talkativeness, no racing thoughts, no impulsivity or risky behaviors, no increased spending, no increased libido, no grandiosity.  Due to these problems she has been needing the Xanax more often.  It does help.  Reports that the tremor is better being on the propranolol and sometimes the Xanax helps as well.  Review of Systems  Constitutional: Negative.   HENT: Negative.   Eyes: Negative.   Respiratory: Negative.   Cardiovascular: Negative.   Gastrointestinal: Positive for heartburn.  Genitourinary: Positive for dysuria and frequency.  Musculoskeletal: Negative.   Skin: Negative.   Neurological: Positive for tremors.  Endo/Heme/Allergies: Negative.   Psychiatric/Behavioral: Positive for depression. Negative for hallucinations, memory loss, substance abuse and suicidal ideas. The patient is nervous/anxious and has insomnia.     Individual Medical History/ Review of Systems: Changes? :Yes Is now having stomach issues as well as bladder problems.  See past notes on chart.  Past medications for mental health diagnoses include: Prozac, Paxil, Lexapro, Celexa, Zoloft, Viibryd, Effexor, Cymbalta, Risperdal, Latuda, Lamictal, VPA, Lihad toxicity,Xanax, Wellbutrin, Abilify, Willette Alma, Strattera, Kapvay, Saphris, Adderall, Vyvanse, Lunesta, Sonata, CBZ, Vraylar, Seroquel  Allergies: Iohexol  Current Medications:  Current Outpatient Medications:  .  acetaminophen (TYLENOL) 325 MG tablet, Take 2 tablets (650 mg total) by mouth every 6 (six) hours  as needed for mild pain (or Fever >/= 101)., Disp: 12 tablet, Rfl: 1 .  ALPRAZolam (XANAX) 1 MG tablet, Take 1 tablet (1 mg total) by mouth 3 (three) times daily as needed. for anxiety, Disp: 90 tablet, Rfl: 5 .  atorvastatin (LIPITOR) 10 MG tablet, Take 10 mg by mouth at bedtime. , Disp: , Rfl: 1 .   B Complex-C (SUPER B COMPLEX PO), Take by mouth daily., Disp: , Rfl:  .  buPROPion (WELLBUTRIN XL) 150 MG 24 hr tablet, TAKE 3 TABLETS BY MOUTH IN THE MORNING, Disp: 90 tablet, Rfl: 0 .  cetirizine (ZYRTEC) 10 MG tablet, Take 10 mg by mouth daily as needed for allergies. , Disp: , Rfl:  .  Coenzyme Q10 (COQ10 PO), Take by mouth daily., Disp: , Rfl:  .  fluticasone (FLONASE) 50 MCG/ACT nasal spray, Place 2 sprays into both nostrils daily as needed for allergies or rhinitis., Disp: , Rfl:  .  furosemide (LASIX) 40 MG tablet, Take 40 mg by mouth daily. , Disp: , Rfl:  .  lamoTRIgine (LAMICTAL) 200 MG tablet, 1 po q am, and 1.5 po qhs., Disp: 75 tablet, Rfl: 5 .  levothyroxine (SYNTHROID) 75 MCG tablet, Take 75 mcg by mouth daily before breakfast. , Disp: , Rfl:  .  linaclotide (LINZESS) 145 MCG CAPS capsule, Take 1 capsule (145 mcg total) by mouth daily before breakfast., Disp: 30 capsule, Rfl: 3 .  modafinil (PROVIGIL) 200 MG tablet, Take 1 tablet by mouth once daily, Disp: 30 tablet, Rfl: 0 .  montelukast (SINGULAIR) 10 MG tablet, Take 10 mg by mouth daily. , Disp: , Rfl:  .  ondansetron (ZOFRAN ODT) 4 MG disintegrating tablet, Take 1 tablet (4 mg total) by mouth every 8 (eight) hours as needed., Disp: 10 tablet, Rfl: 0 .  OVER THE COUNTER MEDICATION, D-Mannose daily, Disp: , Rfl:  .  pantoprazole (PROTONIX) 40 MG tablet, Take 1 tablet (40 mg total) by mouth 2 (two) times daily before a meal., Disp: 60 tablet, Rfl: 5 .  pramipexole (MIRAPEX) 1 MG tablet, Take 2 tablets (2 mg total) by mouth at bedtime., Disp: 60 tablet, Rfl: 5 .  propranolol (INDERAL) 20 MG tablet, Take 1 tablet (20 mg total) by mouth at bedtime., Disp: 30 tablet, Rfl: 5 .  tamsulosin (FLOMAX) 0.4 MG CAPS capsule, Take 1 capsule (0.4 mg total) by mouth daily., Disp: 30 capsule, Rfl: 0 .  trimethoprim (TRIMPEX) 100 MG tablet, Take 100 mg by mouth daily., Disp: , Rfl:  .  ascorbic acid (VITAMIN C) 500 MG tablet, Take 500 mg by mouth  daily., Disp: , Rfl:  .  calcium carbonate (TUMS - DOSED IN MG ELEMENTAL CALCIUM) 500 MG chewable tablet, Chew 2 tablets by mouth 2 (two) times daily as needed for indigestion or heartburn. , Disp: , Rfl:  .  CRANBERRY EXTRACT PO, Take by mouth 2 (two) times daily., Disp: , Rfl:  .  polyethylene glycol (MIRALAX / GLYCOLAX) 17 g packet, Take 17 g by mouth 2 (two) times daily. (Patient not taking: Reported on 01/05/2019), Disp: 60 each, Rfl: 1 .  QUEtiapine (SEROQUEL XR) 300 MG 24 hr tablet, Take 2 tablets (600 mg total) by mouth at bedtime., Disp: 60 tablet, Rfl: 1 Medication Side Effects: none  Family Medical/ Social History: Changes? Yes Her Mom is in the hospital now.   MENTAL HEALTH EXAM:  There were no vitals taken for this visit.There is no height or weight on file to calculate BMI.  General Appearance: unable to assess  Eye Contact:  unable to assess  Speech:  Clear and Coherent  Volume:  Normal  Mood:  Depressed  Affect:  unable to assess  Thought Process:  Goal Directed and Descriptions of Associations: Intact  Orientation:  Full (Time, Place, and Person)  Thought Content: Logical   Suicidal Thoughts:  No  Homicidal Thoughts:  No  Memory:  WNL  Judgement:  Good  Insight:  Good  Psychomotor Activity:  unable to assess  Concentration:  Concentration: Good  Recall:  Good  Fund of Knowledge: Good  Language: Good  Assets:  Desire for Improvement  ADL's:  Intact  Cognition: WNL  Prognosis:  Good    DIAGNOSES:    ICD-10-CM   1. Bipolar disorder with depression ([place])  F31.9   2. Generalized anxiety disorder  F41.1   3. Tremor  R25.1   4. Irritability and anger  R45.4     Receiving Psychotherapy: No    RECOMMENDATIONS:  I spent 30 minutes with her phone to phone and 50% of that time was in counseling concerning her diagnosis and treatment options. She was on lithium for quite a while but unfortunately became toxic requiring hospitalization, earlier this year.  That  was even with regular lab draws for lithium toxicity.  So of course were both hesitant to go back on that drug although it can help depression even at a very low dose. We agreed to increase the Seroquel for now.  In the future I may need to consider an MAO I or a tricyclic.  We will discuss further at her next visit. Increase Seroquel XR 300 mg to 2 nightly. Continue modafinil 200 mg every morning. Continue pramipexole 1 mg, 2 p.o. nightly. Continue propranolol 20 mg nightly. Return in 4 to 6 weeks.  [name], PA-C

## 2019-01-11 ENCOUNTER — Ambulatory Visit (INDEPENDENT_AMBULATORY_CARE_PROVIDER_SITE_OTHER): Payer: 59 | Admitting: Family Medicine

## 2019-01-11 ENCOUNTER — Other Ambulatory Visit: Payer: Self-pay

## 2019-01-11 DIAGNOSIS — E669 Obesity, unspecified: Secondary | ICD-10-CM | POA: Diagnosis not present

## 2019-01-11 NOTE — Progress Notes (Signed)
Telehealth Encounter I connected with Kristen [name] (MRN [id number]) on 01/11/2019 by [email or web address] video-enabled, HIPAA-compliant telemedicine application, verified that I am speaking with the correct person using two identifiers, and that the patient was in a private environment conducive to confidentiality.  I discussed the limitations of evaluation and management by telemedicine. The patient expressed understanding and agreed to proceed.  Provider was Kennith Center, PhD, RD, LDN, CEDRD Provider(s) located at home during this telehealth encounter; patient was at home  Appt start time: 1430 end time: [id number] (1 hour)  Reason for telehealth visit: Kristen [name] was referred by Consuello Masse, MD for Medical Nutrition Therapy for obesity, E66.9 (BMI >37 on 08/21/18).  Relevant history/background: Kristen [name] identifies herself as both an emotional and a binge eater.  Bipolar D/O represents a challenge to eating behaviors; tends to binge eat when she is depressed.  Has had much stress in past 6 months, which has made appetite management more difficult.    Assessment: Kristen [name]'s mom was hospitalized last month, and is now in rehab for a number of health problems, which has been stressful for Kristen [name], an only child. With all the medical issues and her mom's hospitalization, Kristen [name]'s eating pattern has been erratic, food choices have been poor, and she has walked very few times. Fasting Lipid panel from 9/16: TC 245; TG 552;HDL 45; LDL 106.  Usual eating pattern: 2 meals (bkfst and dinner) most days.  Not usually getting veg's.    Usual physical activity: Has only walked "a handful of times."  Sleep: Getting ~6 hrs of sleep/night.  Still sometimes sleeping last part of the night in recliner b/c of back pain.    No 24-hr recall today.   Intervention: Reviewed Kristen [name]'s current circumstances and explored ways in which she can help herself be more successful at meeting established goals.  Determined that advance planning for  both meals and physical activity will be crucial.    For recommendations and goals, see Patient Instructions.   Follow-up: Telehealth visit in 4 weeks.  Kristen [name],[name]

## 2019-01-11 NOTE — Patient Instructions (Addendum)
Reiterated from last appointment:  - Try roasted veg's: Place in a hot (~400 degrees) oven till browned.  Vegetables that work well roasted: zucchini, squash, onions, garlic, bell pepper, broccoli, cauliflower, mushrooms, and all root vegetables, like carrots, sweet potatoes, and beets.   - Try stir-frying any greens.  Start with a leafy green such as spinach; get the baby greens (small leaves), and stir-fry in a small amt of olive oil for a very short time.  After you've done that a few times, try mixing in another leafy green.   - Make meal plans with your son, and be sure to have plenty of good food ingredients on hand.   - Ask your son Theresia Lo if he can buy more of the sweets you don't care for as much.   - Remember that you are much more likely to make poor food decisions and to binge eat if you don't eat 3 regular meals.  Any time you get over-hungry, you are in the danger zone, and are setting yourself up for failure! - Make a to-do list for those times you need an activity to distract from a food craving. WRITE THIS DOWN, and put it someplace readily available!  Goals:   1. At least 5 days a week: plan the next day's meals  and physical activity.  2. Have at least one vegetable serving per day. 3. Walk at least 30 minutes 3 times a week.    Use the Goals Sheet to document progress on your goals.    Follow-up appt on Monday, January 4 at 2:30 PM via [email or web address].

## 2019-01-24 ENCOUNTER — Other Ambulatory Visit: Payer: Self-pay | Admitting: Physician Assistant

## 2019-02-06 ENCOUNTER — Other Ambulatory Visit: Payer: Self-pay | Admitting: Physician Assistant

## 2019-02-08 ENCOUNTER — Other Ambulatory Visit: Payer: Self-pay | Admitting: Physician Assistant

## 2019-02-08 DIAGNOSIS — R1310 Dysphagia, unspecified: Secondary | ICD-10-CM | POA: Insufficient documentation

## 2019-02-08 DIAGNOSIS — R490 Dysphonia: Secondary | ICD-10-CM | POA: Insufficient documentation

## 2019-02-08 NOTE — Telephone Encounter (Signed)
Apt 01/08

## 2019-02-15 ENCOUNTER — Telehealth: Payer: 59 | Admitting: Family Medicine

## 2019-02-19 ENCOUNTER — Ambulatory Visit (INDEPENDENT_AMBULATORY_CARE_PROVIDER_SITE_OTHER): Payer: 59 | Admitting: Physician Assistant

## 2019-02-19 ENCOUNTER — Encounter: Payer: Self-pay | Admitting: Physician Assistant

## 2019-02-19 ENCOUNTER — Ambulatory Visit: Payer: Medicare Other | Admitting: Physician Assistant

## 2019-02-19 DIAGNOSIS — F411 Generalized anxiety disorder: Secondary | ICD-10-CM | POA: Diagnosis not present

## 2019-02-19 DIAGNOSIS — F319 Bipolar disorder, unspecified: Secondary | ICD-10-CM

## 2019-02-19 DIAGNOSIS — T50905A Adverse effect of unspecified drugs, medicaments and biological substances, initial encounter: Secondary | ICD-10-CM

## 2019-02-19 DIAGNOSIS — Z634 Disappearance and death of family member: Secondary | ICD-10-CM

## 2019-02-19 DIAGNOSIS — R251 Tremor, unspecified: Secondary | ICD-10-CM | POA: Diagnosis not present

## 2019-02-19 DIAGNOSIS — E032 Hypothyroidism due to medicaments and other exogenous substances: Secondary | ICD-10-CM

## 2019-02-19 DIAGNOSIS — R635 Abnormal weight gain: Secondary | ICD-10-CM

## 2019-02-19 MED ORDER — QUETIAPINE FUMARATE ER 300 MG PO TB24: 600.0000 mg | ORAL_TABLET | Freq: Every day | ORAL | 5 refills | Status: DC

## 2019-02-19 MED ORDER — MODAFINIL 200 MG PO TABS: 200.0000 mg | ORAL_TABLET | Freq: Every day | ORAL | 5 refills | Status: DC

## 2019-02-19 NOTE — Progress Notes (Signed)
[facility]  Patient ID: Kristen [name],  MRN: [id number]  PCP: Manon Hilding, MD  Date of Evaluation: 02/19/2019 Time spent:25 minutes  Chief Complaint:  Chief Complaint    Depression     Virtual Visit via Telephone Note  I connected with patient by a video enabled telemedicine application or telephone, with their informed consent, and verified patient privacy and that I am speaking with the correct person using two identifiers.  I am private, in my office and the patient is home.  I discussed the limitations, risks, security and privacy concerns of performing an evaluation and management service by telephone and the availability of in person appointments. I also discussed with the patient that there may be a patient responsible charge related to this service. The patient expressed understanding and agreed to proceed.   I discussed the assessment and treatment plan with the patient. The patient was provided an opportunity to ask questions and all were answered. The patient agreed with the plan and demonstrated an understanding of the instructions.   The patient was advised to call back or seek an in-person evaluation if the symptoms worsen or if the condition fails to improve as anticipated.  I provided 25 minutes of non-face-to-face time during this encounter.  HISTORY/CURRENT STATUS: HPI For routine med check.  At [place], we increased the Seroquel.  It has helped her mood quite a bit.  She is not as depressed as she was.  She still has a lot going on.  Her mom is currently in the hospital with sepsis, UTI, and pneumonia.  Patient cannot go see her due to COVID restrictions.  Christmas was really different because it was the first 1 without her dad.  She did not contact, and was not contacted by her stepmom, "and I wanted to stay that way."  She was not able to be with her mother and stepfather due to her mother's illness.  Then the patient's husband's family had several  people in quarantine so they had to cancel Christmas with them.  The patient, her husband, and son spend Christmas together and even though it was quiet, she said it was nice.  States she is able to enjoy some things again.  Her energy and motivation are a bit better.  She does not cry as easily.  Her only complaint is weight gain.  She is not really sure it is only related to the Seroquel.  Over the holidays, she did eat more high-calorie foods.  She also stopped walking routinely for exercise.  So there are a lot of factors in play there.  She continues to have anxiety, mostly triggered by the things that are going on in her life.  The Xanax does help.  She has started seeing a counselor again which is very helpful.  She really likes her new counselor and is learning some good tips about coping skills.  The patient is also journaling every day.  "I am trying to do things to help myself."  Patient denies increased energy with decreased need for sleep, no increased talkativeness, no racing thoughts, no impulsivity or risky behaviors, no increased spending, no increased libido, no grandiosity. Denies extreme irritability like she has had in the past.  Denies dizziness, syncope, seizures, numbness, tingling, tremor is better, tics, unsteady gait, slurred speech, confusion. Denies muscle or joint pain, stiffness, or dystonia.  Individual Medical History/ Review of Systems: Changes? :Yes  weight gain since increasing the Seroquel, but also has decreased  exercise and increased sweets and had holiday foods.  Past medications for mental health diagnoses include: Prozac, Paxil, Lexapro, Celexa, Zoloft, Viibryd, Effexor, Cymbalta, Risperdal, Latuda, Lamictal, VPA, Lihad toxicity,Xanax, Wellbutrin, Abilify, Willette Alma, Strattera, Kapvay, Saphris, Adderall, Vyvanse, Lunesta, Sonata, CBZ, Vraylar, Seroquel  Allergies: Iohexol  Current Medications:  Current Outpatient Medications:  .  acetaminophen (TYLENOL)  325 MG tablet, Take 2 tablets (650 mg total) by mouth every 6 (six) hours as needed for mild pain (or Fever >/= 101)., Disp: 12 tablet, Rfl: 1 .  ALPRAZolam (XANAX) 1 MG tablet, Take 1 tablet (1 mg total) by mouth 3 (three) times daily as needed. for anxiety, Disp: 90 tablet, Rfl: 5 .  atorvastatin (LIPITOR) 10 MG tablet, Take 10 mg by mouth at bedtime. , Disp: , Rfl: 1 .  buPROPion (WELLBUTRIN XL) 150 MG 24 hr tablet, TAKE 3 TABLETS BY MOUTH IN THE MORNING, Disp: 90 tablet, Rfl: 0 .  calcium carbonate (TUMS - DOSED IN MG ELEMENTAL CALCIUM) 500 MG chewable tablet, Chew 2 tablets by mouth 2 (two) times daily as needed for indigestion or heartburn. , Disp: , Rfl:  .  cetirizine (ZYRTEC) 10 MG tablet, Take 10 mg by mouth daily as needed for allergies. , Disp: , Rfl:  .  CRANBERRY EXTRACT PO, Take by mouth 2 (two) times daily., Disp: , Rfl:  .  fluticasone (FLONASE) 50 MCG/ACT nasal spray, Place 2 sprays into both nostrils daily as needed for allergies or rhinitis., Disp: , Rfl:  .  lamoTRIgine (LAMICTAL) 200 MG tablet, 1 po q am, and 1.5 po qhs. (Patient taking differently: 200 mg. 2.5 po qhs.), Disp: 75 tablet, Rfl: 5 .  levothyroxine (SYNTHROID) 75 MCG tablet, Take 75 mcg by mouth daily before breakfast. , Disp: , Rfl:  .  linaclotide (LINZESS) 145 MCG CAPS capsule, Take 1 capsule (145 mcg total) by mouth daily before breakfast., Disp: 30 capsule, Rfl: 3 .  mirabegron ER (MYRBETRIQ) 50 MG TB24 tablet, Take 50 mg by mouth daily., Disp: , Rfl:  .  modafinil (PROVIGIL) 200 MG tablet, Take 1 tablet (200 mg total) by mouth daily., Disp: 30 tablet, Rfl: 5 .  montelukast (SINGULAIR) 10 MG tablet, Take 10 mg by mouth daily. , Disp: , Rfl:  .  pantoprazole (PROTONIX) 40 MG tablet, Take 1 tablet (40 mg total) by mouth 2 (two) times daily before a meal., Disp: 60 tablet, Rfl: 5 .  propranolol (INDERAL) 20 MG tablet, Take 1 tablet (20 mg total) by mouth at bedtime., Disp: 30 tablet, Rfl: 5 .  QUEtiapine (SEROQUEL  XR) 300 MG 24 hr tablet, Take 2 tablets (600 mg total) by mouth at bedtime., Disp: 60 tablet, Rfl: 5 .  torsemide (DEMADEX) 20 MG tablet, TAKE 1 TABLET BY MOUTH ONCE DAILY, Disp: , Rfl:  .  trimethoprim (TRIMPEX) 100 MG tablet, Take 100 mg by mouth daily., Disp: , Rfl:  .  ascorbic acid (VITAMIN C) 500 MG tablet, Take 500 mg by mouth daily., Disp: , Rfl:  .  B Complex-C (SUPER B COMPLEX PO), Take by mouth daily., Disp: , Rfl:  .  Coenzyme Q10 (COQ10 PO), Take by mouth daily., Disp: , Rfl:  .  furosemide (LASIX) 40 MG tablet, Take 40 mg by mouth daily. , Disp: , Rfl:  .  ondansetron (ZOFRAN ODT) 4 MG disintegrating tablet, Take 1 tablet (4 mg total) by mouth every 8 (eight) hours as needed. (Patient not taking: Reported on 02/19/2019), Disp: 10 tablet, Rfl: 0 .  OVER  THE COUNTER MEDICATION, D-Mannose daily, Disp: , Rfl:  .  polyethylene glycol (MIRALAX / GLYCOLAX) 17 g packet, Take 17 g by mouth 2 (two) times daily. (Patient not taking: Reported on 01/05/2019), Disp: 60 each, Rfl: 1 .  pramipexole (MIRAPEX) 1 MG tablet, Take 2 tablets (2 mg total) by mouth at bedtime., Disp: 60 tablet, Rfl: 5 .  tamsulosin (FLOMAX) 0.4 MG CAPS capsule, Take 1 capsule (0.4 mg total) by mouth daily. (Patient not taking: Reported on 02/19/2019), Disp: 30 capsule, Rfl: 0 Medication Side Effects: weight gain  Family Medical/ Social History: Changes? Yes Mother is in hospital now, and her health is declining.  MENTAL HEALTH EXAM:  There were no vitals taken for this visit.There is no height or weight on file to calculate BMI.  General Appearance: unable to assess  Eye Contact:  unable to assess  Speech:  Clear and Coherent  Volume:  Normal  Mood:  Euthymic  Affect:  unable to assess  Thought Process:  Goal Directed and Descriptions of Associations: Intact  Orientation:  Full (Time, Place, and Person)  Thought Content: Logical   Suicidal Thoughts:  No  Homicidal Thoughts:  No  Memory:  WNL  Judgement:  Good   Insight:  Good  Psychomotor Activity:  unable to assess  Concentration:  Concentration: Good  Recall:  Good  Fund of Knowledge: Good  Language: Good  Assets:  Desire for Improvement  ADL's:  Intact  Cognition: WNL  Prognosis:  Good    DIAGNOSES:    ICD-10-CM   1. Bipolar disorder with depression ([place])  F31.9   2. Generalized anxiety disorder  F41.1   3. 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 Bereavement  Z63.4   6. Weight gain due to medication  R63.5    T50.905A     Receiving Psychotherapy: Yes  Felix Ahmadi in [place]:  I spent 25 minutes with her discussing her mom's medical problems and the patient's social history changes. I am glad to see that she is doing better! As far as the weight gain goes, I think the cause is multifactorial and recommend that she cut back on carbs, get back to walking daily if at all possible at least walk a few miles, and if that does not help with the weight gain, we can consider changes in the future.  Neither 1 of [place] want to change the Seroquel because she is doing so much better right now.  Consider Metformin in the future but would need to follow kidney functions closely as she is on a diuretic. Continue Xanax 1 mg 3 times daily as needed. Continue Wellbutrin XL 150 mg, 3 p.o. every morning. Continue Lamictal 200 mg, 2-1/2 pills nightly.  (We tried to split up the dose to twice daily but she was not able to tolerate it due to sedation in the morning.) Continue modafinil 200 mg every morning. Continue pramipexole 1 mg, 2 nightly. Continue propranolol 20 mg nightly. Continue Seroquel X are 300 mg, 2 p.o. nightly. Continue counseling with Felix Ahmadi Return in 6 weeks.  [name], PA-C

## 2019-02-21 ENCOUNTER — Other Ambulatory Visit: Payer: Self-pay | Admitting: Physician Assistant

## 2019-03-01 ENCOUNTER — Telehealth: Payer: Self-pay | Admitting: Physician Assistant

## 2019-03-01 NOTE — Telephone Encounter (Signed)
Yes, it is possible that the seroquel has a couple of those problems, especially weight gain and constipation.  She seemed to have responded well to it, at least for a while, and that is the reason I did not want to change.  But we could switch to [place].  Let her know it is in the same class as the Seroquel but does not tend to have the weight issues that Seroquel can.  If she would like to change that is fine but I would really prefer that she wait until her appointment with me so we can discuss it together.  If she is adamant about changing now, we can do that.  We have to get samples to her and a co-pay card, and then of course the PA so it may take a few weeks to get all of that taken care of.

## 2019-03-01 NOTE — Telephone Encounter (Signed)
Left voicemail to call back with her side effects

## 2019-03-01 NOTE — Telephone Encounter (Signed)
Kristen [name] called to report that after reading the various side effects of seroquel she realized she is experiencing several of the side effects.  She is thinking she shouldn't be taking this medication.  Please call to discuss.  Next appt 04/02/19.

## 2019-03-01 NOTE — Telephone Encounter (Signed)
Given information and agreed to discuss at visit. She will call back tomorrow to see if she can get a sooner apt if not keep as scheduled. Gave patient the name of Rexulti so she would have an idea of what it would be changed to.

## 2019-03-04 ENCOUNTER — Ambulatory Visit (INDEPENDENT_AMBULATORY_CARE_PROVIDER_SITE_OTHER): Payer: 59 | Admitting: Physician Assistant

## 2019-03-04 ENCOUNTER — Encounter: Payer: Self-pay | Admitting: Physician Assistant

## 2019-03-04 DIAGNOSIS — F411 Generalized anxiety disorder: Secondary | ICD-10-CM

## 2019-03-04 DIAGNOSIS — T50905A Adverse effect of unspecified drugs, medicaments and biological substances, initial encounter: Secondary | ICD-10-CM

## 2019-03-04 DIAGNOSIS — R454 Irritability and anger: Secondary | ICD-10-CM

## 2019-03-04 DIAGNOSIS — F319 Bipolar disorder, unspecified: Secondary | ICD-10-CM

## 2019-03-04 DIAGNOSIS — T887XXA Unspecified adverse effect of drug or medicament, initial encounter: Secondary | ICD-10-CM

## 2019-03-04 DIAGNOSIS — Z634 Disappearance and death of family member: Secondary | ICD-10-CM

## 2019-03-04 DIAGNOSIS — R635 Abnormal weight gain: Secondary | ICD-10-CM

## 2019-03-04 MED ORDER — QUETIAPINE FUMARATE 200 MG PO TABS: ORAL_TABLET | ORAL | 0 refills | Status: DC

## 2019-03-04 NOTE — Progress Notes (Signed)
[facility]  Patient ID: Kristen [name],  MRN: [id number]  PCP: Kristen Hilding, MD  Date of Evaluation: 03/04/2019 Time spent:45 minutes  Chief Complaint:  Chief Complaint    Anxiety; Depression; Follow-up     Virtual Visit via Video Note  I connected with Kristen [name] on 03/04/19 at  2:30 PM EST by a video enabled telemedicine application and verified that I am speaking with the correct person using two identifiers.  Location: Patient: Home Provider: Office   I discussed the limitations of evaluation and management by telemedicine and the availability of in person appointments. The patient expressed understanding and agreed to proceed.  Kristen Moat, PA-C  HISTORY/CURRENT STATUS: HPI not doing well.  Her husband was also in on the video call in the background.  Patient has done some research and has found that she is having several of the side effects that can go along with Seroquel.  She is having urinary retention and has been put on several different medications for that.  She was hospitalized in the early summer of last year because of urosepsis, and will be having a cystoscopy on February 3.  Reports a tremendous amount of weight gain, dry mouth, and constipation ever since starting on the Seroquel about 6 or 7 months ago.  She would like to go off it and take only the Lamictal as a mood stabilizer to see how she does.  [name] has been on many different drugs for bipolar disorder over the years.  See those below.  In early 2020, she had lithium toxicity and was hospitalized for several days.  It was at that point that we started the Seroquel.  She was having increased irritability and it was felt that she needed to be on something that would decrease her risk of mania.  However, she nor her husband feel like the Seroquel has helped at all.  She has not been full-blown manic but states she is always irritable and the Seroquel certainly has not helped that.  She  has been under a lot of stress this year though.  Her father died.  Her son who is in the [employer] was transferred to [place].  Now her mother is sick.  "There has just been a lot on my plate.  I could not tell you if the medications were working or not."  With all of these changes in her life, it has been difficult to tell whether the medicines are working.  She has had increased irritability off and on but most of the time, they seen triggered by family event or something of that nature.  It is not just out of the blue.  She has not had any time of increased energy, decreased need for sleep, increased risky behavior or impulsivity, no increase in libido or spending.  No grandiosity.  No hallucinations.  Reports getting "down at times" but denies suicidal thoughts.  She rarely has a lot of energy but some days she has more than others and is more motivated than others on certain days.  States there is no rhyme or reason to it.  The modafinil does help her have some energy and motivation.  She is not crying easily.  Memory is at baseline and focus and concentration are stable.  Anxiety is still an issue but he is relieved with the Xanax.  It is mostly a generalized anxiety but occasionally she does have panic attacks.  Those are usually triggered by things that she  has no control over.  Review of Systems  Constitutional: Positive for malaise/fatigue.  HENT: Negative.   Eyes: Negative.   Respiratory: Negative.   Cardiovascular: Negative.   Gastrointestinal: Negative.   Genitourinary:       Urinary retention  Musculoskeletal: Positive for myalgias.  Skin: Negative.   Neurological: Negative.   Endo/Heme/Allergies: Negative.   Psychiatric/Behavioral: Positive for depression. Negative for hallucinations, memory loss, substance abuse and suicidal ideas. The patient is nervous/anxious and has insomnia.    Individual Medical History/ Review of Systems: Changes? :Yes    Past medications for mental  health diagnoses include: Prozac, Paxil, Lexapro, Celexa, Zoloft, Viibryd, Effexor, Cymbalta, Risperdal, Latuda, Lamictal, VPA, Lithium-became toxic, Xanax, Wellbutrin, Abilify, Willette Alma, Strattera, Kapvay, Saphris, Adderall, Vyvanse, Lunesta, Sonata, CBZ, Vraylar, Seroquel, Geodon, Tegretal  Allergies: Iohexol  Current Medications:  Current Outpatient Medications:  .  acetaminophen (TYLENOL) 325 MG tablet, Take 2 tablets (650 mg total) by mouth every 6 (six) hours as needed for mild pain (or Fever >/= 101)., Disp: 12 tablet, Rfl: 1 .  ALPRAZolam (XANAX) 1 MG tablet, Take 1 tablet (1 mg total) by mouth 3 (three) times daily as needed. for anxiety, Disp: 90 tablet, Rfl: 5 .  ascorbic acid (VITAMIN C) 500 MG tablet, Take 500 mg by mouth daily., Disp: , Rfl:  .  atorvastatin (LIPITOR) 10 MG tablet, Take 10 mg by mouth at bedtime. , Disp: , Rfl: 1 .  B Complex-C (SUPER B COMPLEX PO), Take by mouth daily., Disp: , Rfl:  .  buPROPion (WELLBUTRIN XL) 150 MG 24 hr tablet, TAKE 3 TABLETS BY MOUTH IN THE MORNING, Disp: 90 tablet, Rfl: 1 .  calcium carbonate (TUMS - DOSED IN MG ELEMENTAL CALCIUM) 500 MG chewable tablet, Chew 2 tablets by mouth 2 (two) times daily as needed for indigestion or heartburn. , Disp: , Rfl:  .  cetirizine (ZYRTEC) 10 MG tablet, Take 10 mg by mouth daily as needed for allergies. , Disp: , Rfl:  .  Coenzyme Q10 (COQ10 PO), Take by mouth daily., Disp: , Rfl:  .  CRANBERRY EXTRACT PO, Take by mouth 2 (two) times daily., Disp: , Rfl:  .  fluticasone (FLONASE) 50 MCG/ACT nasal spray, Place 2 sprays into both nostrils daily as needed for allergies or rhinitis., Disp: , Rfl:  .  lamoTRIgine (LAMICTAL) 200 MG tablet, 1 po q am, and 1.5 po qhs. (Patient taking differently: 200 mg. 2.5 po qhs.), Disp: 75 tablet, Rfl: 5 .  levothyroxine (SYNTHROID) 75 MCG tablet, Take 75 mcg by mouth daily before breakfast. , Disp: , Rfl:  .  linaclotide (LINZESS) 145 MCG CAPS capsule, Take 1 capsule (145 mcg  total) by mouth daily before breakfast., Disp: 30 capsule, Rfl: 3 .  mirabegron ER (MYRBETRIQ) 50 MG TB24 tablet, Take 50 mg by mouth daily., Disp: , Rfl:  .  modafinil (PROVIGIL) 200 MG tablet, Take 1 tablet (200 mg total) by mouth daily., Disp: 30 tablet, Rfl: 5 .  montelukast (SINGULAIR) 10 MG tablet, Take 10 mg by mouth daily. , Disp: , Rfl:  .  OVER THE COUNTER MEDICATION, D-Mannose daily, Disp: , Rfl:  .  pantoprazole (PROTONIX) 40 MG tablet, Take 1 tablet (40 mg total) by mouth 2 (two) times daily before a meal., Disp: 60 tablet, Rfl: 5 .  pramipexole (MIRAPEX) 1 MG tablet, Take 2 tablets (2 mg total) by mouth at bedtime., Disp: 60 tablet, Rfl: 5 .  propranolol (INDERAL) 20 MG tablet, Take 1 tablet (20 mg total)  by mouth at bedtime., Disp: 30 tablet, Rfl: 5 .  torsemide (DEMADEX) 20 MG tablet, TAKE 1 TABLET BY MOUTH ONCE DAILY, Disp: , Rfl:  .  trimethoprim (TRIMPEX) 100 MG tablet, Take 100 mg by mouth daily., Disp: , Rfl:  .  furosemide (LASIX) 40 MG tablet, Take 40 mg by mouth daily. , Disp: , Rfl:  .  ondansetron (ZOFRAN ODT) 4 MG disintegrating tablet, Take 1 tablet (4 mg total) by mouth every 8 (eight) hours as needed. (Patient not taking: Reported on 02/19/2019), Disp: 10 tablet, Rfl: 0 .  polyethylene glycol (MIRALAX / GLYCOLAX) 17 g packet, Take 17 g by mouth 2 (two) times daily. (Patient not taking: Reported on 01/05/2019), Disp: 60 each, Rfl: 1 .  QUEtiapine (SEROQUEL) 200 MG tablet, 2 po qhs for 4 days, then 1.5 po qhs for 4 days, then 1 qhs for 4 days, then 0.5 po qhs for 4 days then stop., Disp: 20 tablet, Rfl: 0 .  tamsulosin (FLOMAX) 0.4 MG CAPS capsule, Take 1 capsule (0.4 mg total) by mouth daily. (Patient not taking: Reported on 02/19/2019), Disp: 30 capsule, Rfl: 0 Medication Side Effects: constipation, dry mouth, urinary retention and weight gain  Family Medical/ Social History: Changes? No  MENTAL HEALTH EXAM:  There were no vitals taken for this visit.There is no height  or weight on file to calculate BMI.  General Appearance: Casual and Well Groomed  Eye Contact:  Good  Speech:  Clear and Coherent  Volume:  Normal  Mood:  Euthymic  Affect:  Appropriate  Thought Process:  Goal Directed and Descriptions of Associations: Intact  Orientation:  Full (Time, Place, and Person)  Thought Content: Logical   Suicidal Thoughts:  No  Homicidal Thoughts:  No  Memory:  WNL  Judgement:  Good  Insight:  Good  Psychomotor Activity:  Normal  Concentration:  Concentration: Good  Recall:  Good  Fund of Knowledge: Good  Language: Good  Assets:  Desire for Improvement  ADL's:  Intact  Cognition: WNL  Prognosis:  Good    DIAGNOSES:    ICD-10-CM   1. Bipolar disorder with depression ([place])  F31.9   2. Generalized anxiety disorder  F41.1   3. Irritability and anger  R45.4   4. Bereavement  Z63.4   5. Weight gain due to medication  R63.5    T50.905A   6. Medication side effects  T88.7XXA     Receiving Psychotherapy: No    RECOMMENDATIONS:  I spent 45 minutes with her. PDMP was reviewed. We discussed the possible side effects of urinary retention, constipation, dry mouth, and especially weight gain secondary to the Seroquel.  This medication does not seem to be helping so we will wean her off of it.  I hope that the above problems will resolve.  Patient states that she had urinary problems before the Seroquel but they are much worse now. She has been on many different drugs but has not tried Trileptal.  That may be a good choice for future reference.  She really would prefer not to add another medication on.  I explained that the Trileptal would help prevent/treat a manic episode.  And that that was the goal of the Seroquel because although she is on Lamictal, that does not help with mania so much as it does depression.  She verbalizes understanding but would prefer to wait.  I think this is fine because she is very in tune with her moods.  Her husband is as well  and  if she starts getting more irritable or having any manic symptoms whatsoever then she will call us.  Discontinue Seroquel XR. Wean off Seroquel 200 mg, 2 p.o. nightly for 4 nights, then 1.5 p.o. nightly for 4 nights, then 1 p.o. nightly for 4 nights, then 0.5 p.o. nightly for 4 nights and then stop.  Patient is aware that she may not be able to sleep well after stopping this drug.  If that does occur, call and I may have to give her a 25 mg for a while just so she can get some sleep. Continue propranolol 20 mg nightly. Continue pramipexole 1 mg, 2 p.o. nightly. Continue modafinil 200 mg 1 daily. Continue Lamictal 200 mg, 2.5 pills nightly.  (She is unable to split them up because it makes her sleepy in the mornings.) Continue Wellbutrin XL 150 mg, 3 p.o. daily. Continue Xanax 1 mg, 1 p.o. 3 times daily as needed. Return in 1 month.  Kristen Moat, PA-C

## 2019-03-06 ENCOUNTER — Other Ambulatory Visit: Payer: Self-pay | Admitting: Physician Assistant

## 2019-03-08 ENCOUNTER — Ambulatory Visit: Payer: 59 | Admitting: Gastroenterology

## 2019-03-15 ENCOUNTER — Telehealth: Payer: Self-pay | Admitting: Physician Assistant

## 2019-03-15 NOTE — Telephone Encounter (Signed)
Kristen [name], please triage.  At the last visit, we talked about Seroquel not working plus it was possibly causing urinary retention.  Why does she want to stay on it?  And what did her urologist say?

## 2019-03-15 NOTE — Telephone Encounter (Signed)
Pt feels she needs to stay on Seroquel, Does not want to stop. Also had labs done by PCP and will get results to you. Her VIT D was low and they put her on Vit D.

## 2019-03-16 ENCOUNTER — Other Ambulatory Visit: Payer: Self-pay | Admitting: Physician Assistant

## 2019-03-16 MED ORDER — QUETIAPINE FUMARATE ER 300 MG PO TB24: 600.0000 mg | ORAL_TABLET | Freq: Every day | ORAL | 5 refills | Status: DC

## 2019-03-16 NOTE — Telephone Encounter (Signed)
I spoke with patient concerning the Seroquel XR.  Patient stated that her urologist did not say anything concerning the Seroquel, whether she should take it or not.  Patient states that since she has been weaning off, her mood has gotten much worse with terrible irritability.  "I could die the head off of a rattlesnake."  States there is too much stress going on in her life right now, with family issues that she does not want to go off the Seroquel even if it is causing urinary retention. I briefly discussed clozapine with her, including the risks of neutropenia and the need for weekly CBCs for 6 months, every other week CBCs for 6 months and then monthly from then on.  She does not feel comfortable with that and wants to get back on her usual dose of Seroquel. We also discussed Rexulti but she does not feel comfortable with that either. Restart Seroquel XR 300 mg, 2 p.o. nightly.  Prescription was sent into her pharmacy.

## 2019-03-16 NOTE — Telephone Encounter (Signed)
I don't see that we've tried Rexulti.  Please confirm.  If not, let's start 3mg  q am.  She won't need to complete the Seroquel taper if we do that and she can get it today or tomorrow.  If she's done Rexulti, I think we're going to have to use Clozapine.  I'll need to talk w/ her on the phone about that. I'd prefer to use Rexulti first, if haven't used it.

## 2019-03-22 ENCOUNTER — Telehealth: Payer: 59 | Admitting: Family Medicine

## 2019-03-29 ENCOUNTER — Other Ambulatory Visit: Payer: Self-pay | Admitting: Gastroenterology

## 2019-04-02 ENCOUNTER — Encounter: Payer: Self-pay | Admitting: Physician Assistant

## 2019-04-02 ENCOUNTER — Other Ambulatory Visit: Payer: Self-pay | Admitting: Nurse Practitioner

## 2019-04-02 ENCOUNTER — Ambulatory Visit (INDEPENDENT_AMBULATORY_CARE_PROVIDER_SITE_OTHER): Payer: 59 | Admitting: Physician Assistant

## 2019-04-02 DIAGNOSIS — F319 Bipolar disorder, unspecified: Secondary | ICD-10-CM

## 2019-04-02 DIAGNOSIS — F411 Generalized anxiety disorder: Secondary | ICD-10-CM | POA: Diagnosis not present

## 2019-04-02 DIAGNOSIS — F5105 Insomnia due to other mental disorder: Secondary | ICD-10-CM | POA: Diagnosis not present

## 2019-04-02 DIAGNOSIS — G2581 Restless legs syndrome: Secondary | ICD-10-CM

## 2019-04-02 DIAGNOSIS — R454 Irritability and anger: Secondary | ICD-10-CM | POA: Diagnosis not present

## 2019-04-02 DIAGNOSIS — F99 Mental disorder, not otherwise specified: Secondary | ICD-10-CM

## 2019-04-02 MED ORDER — LAMOTRIGINE 200 MG PO TABS: 500.0000 mg | ORAL_TABLET | Freq: Every day | ORAL | 5 refills | Status: DC

## 2019-04-02 MED ORDER — BUPROPION HCL ER (XL) 150 MG PO TB24: ORAL_TABLET | ORAL | 5 refills | Status: DC

## 2019-04-02 MED ORDER — QUETIAPINE FUMARATE ER 400 MG PO TB24: 800.0000 mg | ORAL_TABLET | Freq: Every day | ORAL | 5 refills | Status: DC

## 2019-04-02 NOTE — Progress Notes (Signed)
[facility]  Patient ID: Kristen [name],  MRN: [id number]  PCP: Manon Hilding, MD  Date of Evaluation: 04/02/2019 Time spent:30 minutes  Chief Complaint:  Chief Complaint    Anxiety; Depression; Follow-up     Virtual Visit via Video Note  I connected with [name] on 04/02/19 at  9:30 AM EST by a video enabled telemedicine application and verified that I am speaking with the correct person using two identifiers. We were unable to do video visit b/c I could not connect with WebEx.  Location: Patient: home Provider: office   I discussed the limitations of evaluation and management by telemedicine and the availability of in person appointments. The patient expressed understanding and agreed to proceed.  I provided 30 minutes of non-face-to-face time during this encounter.   [name], PA-C  HISTORY/CURRENT STATUS: HPI for routine med check.  She had the cystoscopy last week.  Everything was normal.  She and her urologist talked about the urinary hesitancy and the fact that the Seroquel can cause that.  Patient states she cannot go without the Seroquel and she told the urologist that.  He said it was not a problem for him if it was not for her.  She is on medications to help with bladder issues and would prefer to keep it that way.  States she has been much more irritable and angry over the past month or so.  "I can bite anybody's head off.  I am not suicidal or depressed.  I am able to do stuff in my energy is good.  I take care of my mom every day.  I am motivated when I want to be.  That is not the problem.  I am just so ill I cannot stand to be around anybody.  My oldest son in the [place] told me he did not want to talk to me anymore.  He is getting on my last nerve."  She is able to focus and finish tasks in a timely manner.  Restless leg symptoms are stable.  Not sleeping well.  She is unable to fall asleep and also unable to stay asleep sometimes.   There are days when she only gets 4 hours of sleep.  She is really tired and does miss the sleep.  Denies increased energy.  No impulsivity or risky behavior.  No increased libido or spending.  No grandiosity.  No hallucinations or paranoia.  Review of Systems  Constitutional: Negative.   HENT: Positive for congestion.        Chronic allergies.  Eyes: Negative.   Respiratory: Negative.   Cardiovascular: Negative.   Gastrointestinal: Positive for heartburn.       Heartburn is mostly controlled with the PPI.  Genitourinary: Positive for frequency.       See above details.  Musculoskeletal: Negative.   Skin: Negative.   Neurological: Negative.   Endo/Heme/Allergies: Negative.   Psychiatric/Behavioral: Negative for depression, hallucinations, memory loss, substance abuse and suicidal ideas. The patient is nervous/anxious and has insomnia.    Individual Medical History/ Review of Systems: Changes? :Yes  See above.  Past medications for mental health diagnoses include: Prozac, Paxil, Lexapro, Celexa, Zoloft, Viibryd, Effexor, Cymbalta, Risperdal, Latuda, Lamictal, VPA, Lithium-became toxic, Xanax, Wellbutrin, Abilify, Willette Alma, Strattera, Kapvay, Saphris, Adderall, Vyvanse, Lunesta, Sonata, CBZ, Vraylar, Seroquel, Geodon, Tegretal  Allergies: Iohexol  Current Medications:  Current Outpatient Medications:  .  acetaminophen (TYLENOL) 325 MG tablet, Take 2 tablets (650 mg total) by mouth every  6 (six) hours as needed for mild pain (or Fever >/= 101)., Disp: 12 tablet, Rfl: 1 .  ALPRAZolam (XANAX) 1 MG tablet, Take 1 tablet (1 mg total) by mouth 3 (three) times daily as needed. for anxiety, Disp: 90 tablet, Rfl: 5 .  atorvastatin (LIPITOR) 10 MG tablet, Take 10 mg by mouth at bedtime. , Disp: , Rfl: 1 .  buPROPion (WELLBUTRIN XL) 150 MG 24 hr tablet, TAKE 3 TABLETS BY MOUTH IN THE MORNING, Disp: 90 tablet, Rfl: 5 .  calcium carbonate (TUMS - DOSED IN MG ELEMENTAL CALCIUM) 500 MG chewable tablet,  Chew 2 tablets by mouth 2 (two) times daily as needed for indigestion or heartburn. , Disp: , Rfl:  .  cetirizine (ZYRTEC) 10 MG tablet, Take 10 mg by mouth daily as needed for allergies. , Disp: , Rfl:  .  CRANBERRY EXTRACT PO, Take by mouth 2 (two) times daily., Disp: , Rfl:  .  fluticasone (FLONASE) 50 MCG/ACT nasal spray, Place 2 sprays into both nostrils daily as needed for allergies or rhinitis., Disp: , Rfl:  .  levothyroxine (SYNTHROID) 75 MCG tablet, Take 75 mcg by mouth daily before breakfast. , Disp: , Rfl:  .  LINZESS 145 MCG CAPS capsule, TAKE 1 CAPSULE BY MOUTH ONCE DAILY BEFORE BREAKFAST, Disp: 90 capsule, Rfl: 3 .  modafinil (PROVIGIL) 200 MG tablet, Take 1 tablet (200 mg total) by mouth daily., Disp: 30 tablet, Rfl: 5 .  montelukast (SINGULAIR) 10 MG tablet, Take 10 mg by mouth daily. , Disp: , Rfl:  .  pantoprazole (PROTONIX) 40 MG tablet, Take 1 tablet (40 mg total) by mouth 2 (two) times daily before a meal., Disp: 60 tablet, Rfl: 5 .  pramipexole (MIRAPEX) 1 MG tablet, Take 1 tablet by mouth twice daily, Disp: 60 tablet, Rfl: 5 .  torsemide (DEMADEX) 20 MG tablet, TAKE 1 TABLET BY MOUTH ONCE DAILY, Disp: , Rfl:  .  trimethoprim (TRIMPEX) 100 MG tablet, Take 100 mg by mouth daily., Disp: , Rfl:  .  ascorbic acid (VITAMIN C) 500 MG tablet, Take 500 mg by mouth daily., Disp: , Rfl:  .  B Complex-C (SUPER B COMPLEX PO), Take by mouth daily., Disp: , Rfl:  .  Coenzyme Q10 (COQ10 PO), Take by mouth daily., Disp: , Rfl:  .  furosemide (LASIX) 40 MG tablet, Take 40 mg by mouth daily. , Disp: , Rfl:  .  lamoTRIgine (LAMICTAL) 200 MG tablet, Take 2.5 tablets (500 mg total) by mouth daily., Disp: 75 tablet, Rfl: 5 .  mirabegron ER (MYRBETRIQ) 50 MG TB24 tablet, Take 50 mg by mouth daily., Disp: , Rfl:  .  ondansetron (ZOFRAN ODT) 4 MG disintegrating tablet, Take 1 tablet (4 mg total) by mouth every 8 (eight) hours as needed. (Patient not taking: Reported on 02/19/2019), Disp: 10 tablet,  Rfl: 0 .  OVER THE COUNTER MEDICATION, D-[place] daily, Disp: , Rfl:  .  polyethylene glycol (MIRALAX / GLYCOLAX) 17 g packet, Take 17 g by mouth 2 (two) times daily. (Patient not taking: Reported on 01/05/2019), Disp: 60 each, Rfl: 1 .  QUEtiapine (SEROQUEL XR) 400 MG 24 hr tablet, Take 2 tablets (800 mg total) by mouth at bedtime., Disp: 60 tablet, Rfl: 5 .  tamsulosin (FLOMAX) 0.4 MG CAPS capsule, Take 1 capsule (0.4 mg total) by mouth daily. (Patient not taking: Reported on 02/19/2019), Disp: 30 capsule, Rfl: 0 Medication Side Effects: none  Family Medical/ Social History: Changes? No  MENTAL HEALTH EXAM:  There were no vitals taken for this visit.There is no height or weight on file to calculate BMI.  General Appearance: unable to assess  Eye Contact:  unable to assess  Speech:  Clear and Coherent  Volume:  Normal  Mood:  Irritable  Affect:  unable to assess  Thought Process:  Goal Directed and Descriptions of Associations: Intact  Orientation:  Full (Time, Place, and Person)  Thought Content: Logical   Suicidal Thoughts:  No  Homicidal Thoughts:  No  Memory:  WNL  Judgement:  Good  Insight:  Good  Psychomotor Activity:  unable to assess  Concentration:  Concentration: Good  Recall:  Good  Fund of Knowledge: Good  Language: Good  Assets:  Desire for Improvement  ADL's:  Intact  Cognition: WNL  Prognosis:  Good    DIAGNOSES:    ICD-10-CM   1. Irritability and anger  R45.4   2. Bipolar I disorder ([place])  F31.9   3. Generalized anxiety disorder  F41.1   4. Insomnia due to other mental disorder  F51.05    F99   5. Restless legs  G25.81     Receiving Psychotherapy: Yes    RECOMMENDATIONS:  PDMP was reviewed. I spent 30 minutes with her. Increase Seroquel XR to 400 mg, 2 p.o. nightly. Continue Xanax 1 mg, 1 p.o. 3 times daily as needed. Continue Wellbutrin XL 150 mg, 3 p.o. daily. Continue Lamictal 200 mg, 2.5 pills nightly. Continue modafinil 200 mg, 1 p.o. every  morning. Continue counseling weekly. Return in 4 weeks.  [name], PA-C

## 2019-04-06 ENCOUNTER — Other Ambulatory Visit: Payer: Self-pay

## 2019-04-07 ENCOUNTER — Ambulatory Visit: Payer: 59 | Admitting: Gastroenterology

## 2019-04-07 MED ORDER — PANTOPRAZOLE SODIUM 40 MG PO TBEC: 40.0000 mg | DELAYED_RELEASE_TABLET | Freq: Two times a day (BID) | ORAL | 3 refills | Status: DC

## 2019-04-19 ENCOUNTER — Encounter: Payer: Self-pay | Admitting: Gastroenterology

## 2019-04-19 ENCOUNTER — Ambulatory Visit: Payer: 59 | Admitting: Gastroenterology

## 2019-04-19 ENCOUNTER — Telehealth: Payer: Self-pay | Admitting: Gastroenterology

## 2019-04-19 ENCOUNTER — Other Ambulatory Visit: Payer: Self-pay

## 2019-04-19 VITALS — BP 128/82 | HR 105 | Temp 97.5°F | Ht 66.0 in | Wt 251.8 lb

## 2019-04-19 DIAGNOSIS — K5909 Other constipation: Secondary | ICD-10-CM

## 2019-04-19 DIAGNOSIS — R110 Nausea: Secondary | ICD-10-CM | POA: Diagnosis not present

## 2019-04-19 DIAGNOSIS — K219 Gastro-esophageal reflux disease without esophagitis: Secondary | ICD-10-CM | POA: Diagnosis not present

## 2019-04-19 MED ORDER — ONDANSETRON HCL 4 MG PO TABS: 4.0000 mg | ORAL_TABLET | Freq: Four times a day (QID) | ORAL | 0 refills | Status: DC | PRN

## 2019-04-19 MED ORDER — LINACLOTIDE 290 MCG PO CAPS: 290.0000 ug | ORAL_CAPSULE | Freq: Every day | ORAL | 0 refills | Status: DC

## 2019-04-19 NOTE — Assessment & Plan Note (Signed)
Well controlled on pantoprazole 40 mg twice daily. Continue current regimen.

## 2019-04-19 NOTE — Assessment & Plan Note (Signed)
Possibly multifactorial in the setting of stress, polypharmacy. Less likely gastritis or peptic ulcer disease given chronic PPI twice daily. Denies any abdominal pain making biliary etiology less likely but will work-up with gallbladder, patient declines at this time stressing that she notes since her dietary intake that is contributing to her symptoms. She is going to work on her diet and exercise. Recommend eliminating spicy/greasy/fatty foods. Eat smaller portions. Avoid overeating. Add daily walking as tolerated.  We will obtain copy of labs from PCP. Need to follow-up hemoglobin in particular to make sure normalized.  Return to the office in 6 months or call sooner if needed.

## 2019-04-19 NOTE — Assessment & Plan Note (Signed)
Poorly controlled. Increase Linzess to 297mcg daily. Samples provided. She will call with a progress report in a couple weeks. If effective we will send in Rx otherwise we will switch her medicine.

## 2019-04-19 NOTE — Telephone Encounter (Signed)
Please request latest labs from PCP, especially need latest CBC and LFTs.

## 2019-04-19 NOTE — Patient Instructions (Signed)
For constipation:  Increase Linzess to 262mcg daily before breakfast. Call and let me know how it works. If it is helpful, then we will send in RX; if not, we will switch your medication.  For nausea:  Try to avoid spicy and greasy/fatty foods. Eat smaller portions. Limit Pepto due to constipation. Use Zofran as needed for significant symptoms. Try adding in exercise to help with your stress and weight gain. If symptoms persisting despite dietary changes, we can order gallbladder ultrasound.   For GERD: Continue pantoprazole 40mg  twice daily before a meal.   Return to the office in six months or call sooner if needed.

## 2019-04-19 NOTE — Progress Notes (Signed)
Primary Care Physician: Manon Hilding, MD  Primary Gastroenterologist:  Garfield Cornea, MD   Chief Complaint  Patient presents with  . Nausea    with eating. reports "chugging pepto"  . Constipation    taking linzess with miralax to have BM when she hasn't had a BM in a few days    HPI: Kristen [name] is a 49 y.o. female here for follow up. Last seen in 11/2018. She has h/o chronic constipation, abdominal bloating/gas, GERD.  Weight is up 32 pounds since 07/2018.   Nausea postprandial. Spicy foods no longer settling now. Used to be able to eat spicy without problems. Under a lot of stress and feels like it contributes to the nausea and weight gain. Stress eater. Realizes food choices and portions sizes are poor given weight gain. No heartburn. Still on pantoprazole BID. Not really having abdominal pain. Feels bloated with overeating. BM not every day even on Linzess 128mcg daily. Has to add Miralax several days per week. No melena, brbpr. Takes all of her once daily medications all in the morning. Does not feel like nausea related to meds. Mostly nausea worse in the evenings. Takes Pepto with evening, husband's company makes it and has a lot of it available to her.  EGD and colonoscopy June 2020: Small hiatal hernia, erythematous mucosa in the stomach with a healing gastric ulcer.  Biopsies negative.  Scattered diverticula in the colon, nonbleeding internal hemorrhoids, 5 mm splenic flexure tubular adenoma removed.  5-year surveillance colonoscopy recommended given family history of colon cancer.   Per patient Triglycerides 891. Motivated to start walking and eating right.    Current Outpatient Medications  Medication Sig Dispense Refill  . acetaminophen (TYLENOL) 325 MG tablet Take 2 tablets (650 mg total) by mouth every 6 (six) hours as needed for mild pain (or Fever >/= 101). 12 tablet 1  . ALPRAZolam (XANAX) 1 MG tablet Take 1 tablet (1 mg total) by mouth 3 (three) times  daily as needed. for anxiety 90 tablet 5  . atorvastatin (LIPITOR) 10 MG tablet Take 10 mg by mouth at bedtime.   1  . buPROPion (WELLBUTRIN XL) 150 MG 24 hr tablet TAKE 3 TABLETS BY MOUTH IN THE MORNING 90 tablet 5  . cetirizine (ZYRTEC) 10 MG tablet Take 10 mg by mouth daily as needed for allergies.     . Cholecalciferol (VITAMIN D) 50 MCG (2000 UT) CAPS Take by mouth.    Drusilla Kanner EXTRACT PO Take by mouth daily.     Marland Kitchen docusate sodium (STOOL SOFTENER) 100 MG capsule Take 100 mg by mouth daily.    . fluticasone (FLONASE) 50 MCG/ACT nasal spray Place 2 sprays into both nostrils daily as needed for allergies or rhinitis.    Marland Kitchen lamoTRIgine (LAMICTAL) 200 MG tablet Take 2.5 tablets (500 mg total) by mouth daily. 75 tablet 5  . levothyroxine (SYNTHROID) 75 MCG tablet Take 75 mcg by mouth daily before breakfast.     . LINZESS 145 MCG CAPS capsule TAKE 1 CAPSULE BY MOUTH ONCE DAILY BEFORE BREAKFAST 90 capsule 3  . modafinil (PROVIGIL) 200 MG tablet Take 1 tablet (200 mg total) by mouth daily. 30 tablet 5  . Multiple Minerals-Vitamins (CALCIUM-MAGNESIUM-ZINC-D3 PO) Take by mouth daily.    Marland Kitchen OVER THE COUNTER MEDICATION zinwise prebiotic/probiotic    . pantoprazole (PROTONIX) 40 MG tablet Take 1 tablet (40 mg total) by mouth 2 (two) times daily with a meal. 180 tablet 3  .  polyethylene glycol (MIRALAX / GLYCOLAX) 17 g packet Take 17 g by mouth 2 (two) times daily. (Patient taking differently: Take 17 g by mouth daily as needed. ) 60 each 1  . pramipexole (MIRAPEX) 1 MG tablet Take 1 tablet by mouth twice daily 60 tablet 5  . Prenatal Vit-Fe Fumarate-FA (PRENATAL MULTIVITAMIN) TABS tablet Take 1 tablet by mouth daily at 12 noon.    . Pseudoephedrine-Guaifenesin (MUCINEX D MAX STRENGTH) [phone] MG TB12 Take by mouth daily.    . QUEtiapine (SEROQUEL XR) 400 MG 24 hr tablet Take 2 tablets (800 mg total) by mouth at bedtime. 60 tablet 5  . torsemide (DEMADEX) 20 MG tablet Take 30 mg by mouth daily.     Marland Kitchen  trimethoprim (TRIMPEX) 100 MG tablet Take 100 mg by mouth daily.    Marland Kitchen ascorbic acid (VITAMIN C) 500 MG tablet Take 500 mg by mouth daily.     No current facility-administered medications for this visit.    Allergies as of 04/19/2019 - Review Complete 04/19/2019  Allergen Reaction Noted  . Iohexol Hives 11/03/2007    ROS:  General: Negative for anorexia, weight loss, fever, chills, fatigue, weakness. ENT: Negative for hoarseness, difficulty swallowing , nasal congestion. CV: Negative for chest pain, angina, palpitations, dyspnea on exertion, peripheral edema.  Respiratory: Negative for dyspnea at rest, dyspnea on exertion, cough, sputum, wheezing.  GI: See history of present illness. GU:  Negative for dysuria, hematuria, urinary incontinence, urinary frequency, nocturnal urination.  Endo: Negative for unusual weight change.    Physical Examination:   BP 128/82   Pulse (!) 105   Temp (!) 97.5 F (36.4 C) (Oral)   Ht 5\' 6"  (1.676 m)   Wt 251 lb 12.8 oz (114.2 kg)   BMI 40.64 kg/m   General: Well-nourished, well-developed in no acute distress.  Eyes: No icterus. Mouth: masked Lungs: Clear to auscultation bilaterally.  Heart: Regular rate and rhythm, no murmurs rubs or gallops.  Abdomen: Bowel sounds are normal, nontender, nondistended, no hepatosplenomegaly or masses, no abdominal bruits or hernia , no rebound or guarding.   Extremities: No lower extremity edema. No clubbing or deformities. Neuro: Alert and oriented x 4   Skin: Warm and dry, no jaundice.   Psych: Alert and cooperative, normal mood and affect.  Labs:  Lab Results  Component Value Date   CREATININE 1.10 (H) 07/12/2018   BUN 9 07/12/2018   NA 147 (H) 07/12/2018   K 3.2 (L) 07/12/2018   CL 116 (H) 07/12/2018   CO2 17 (L) 07/12/2018   Lab Results  Component Value Date   ALT 19 07/11/2018   AST 18 07/11/2018   ALKPHOS 176 (H) 07/11/2018   BILITOT 0.5 07/11/2018   Lab Results  Component Value Date    WBC 16.6 (H) 07/12/2018   HGB 8.4 (L) 07/12/2018   HCT 28.0 (L) 07/12/2018   MCV 94.0 07/12/2018   PLT 474 (H) 07/12/2018   Lab Results  Component Value Date   IRON 14 (L) 07/10/2018   TIBC 252 07/10/2018   FERRITIN 289 07/10/2018     Imaging Studies: No results found.

## 2019-04-22 ENCOUNTER — Telehealth: Payer: Self-pay | Admitting: Internal Medicine

## 2019-04-22 NOTE — Telephone Encounter (Signed)
Pt called stating the Linzess 290 mcg that she was increased to on 04/19/2019, isn't working. Pt started Linzess on 04/20/19 and has taken 2 pills. Pt states she just wants to have a BM. Pt doesn't report nausea, no vmiting. Pt hasn't taken anything else to help her bowels move.

## 2019-04-22 NOTE — Telephone Encounter (Signed)
Spoke with pt. Pt notified of [facility] instructions for bowel purge. Pt will take Linzess 290 mcg with food.

## 2019-04-22 NOTE — Telephone Encounter (Signed)
Pt said the linzess was not working. [phone]

## 2019-04-22 NOTE — Telephone Encounter (Signed)
Let's have her do a mini bowel prep and then continue Linzess 264mcg WITH FOOD.   Mini bowel prep:  Dulcolax 10mg  po X1.  Start Miralax 17 grams mixed in 4 ounces of liquid every hour X 5 doses.  Second dose of dulcolax 10mg  after miralax completed.   Tomorrow continue linzess 263mcg daily but take WITH FOOD.

## 2019-04-23 NOTE — Telephone Encounter (Signed)
Requested labs

## 2019-04-29 ENCOUNTER — Encounter: Payer: Self-pay | Admitting: Gastroenterology

## 2019-04-29 NOTE — Telephone Encounter (Addendum)
Reviewed recent labs from January 2021 PCP:  Triglycerides 861, total cholesterol 284, glucose 102, creatinine 1.15, sodium 139, potassium 3.9, albumin 5.2, total bilirubin 0.5, alkaline phosphatase 171, AST 20, ALT 23, white blood cell count 11,600, hemoglobin 13.7, platelets 511,000, TSH 3.320, ferritin 253   Alkaline phosphatase noted to be in the 170 range back in May 2020.  Still up on repeat in January 2021.Anemia has resolved.  Persisting mild leukocytosis.  Would offer patient follow-up LFTs, CBC, AMA, ferritin to evaluate abnormal lfts and elevated wbc if she is willing.

## 2019-04-30 ENCOUNTER — Other Ambulatory Visit: Payer: Self-pay

## 2019-04-30 ENCOUNTER — Telehealth: Payer: Self-pay | Admitting: Internal Medicine

## 2019-04-30 DIAGNOSIS — R7989 Other specified abnormal findings of blood chemistry: Secondary | ICD-10-CM

## 2019-04-30 DIAGNOSIS — D72829 Elevated white blood cell count, unspecified: Secondary | ICD-10-CM

## 2019-04-30 MED ORDER — LINACLOTIDE 290 MCG PO CAPS: 290.0000 ug | ORAL_CAPSULE | Freq: Every day | ORAL | 5 refills | Status: DC

## 2019-04-30 NOTE — Telephone Encounter (Signed)
Spoke with pt. Pt notified that labs were reviewed by L[facility]. Pt would like to complete labs. Lab orders placed for [facility] and mailed to pt.

## 2019-04-30 NOTE — Telephone Encounter (Signed)
rx sent

## 2019-04-30 NOTE — Addendum Note (Signed)
Addended by: Mahala Menghini on: 04/30/2019 11:07 AM   Modules accepted: Orders

## 2019-04-30 NOTE — Telephone Encounter (Signed)
Pt is taking samples of Linzess 290 mcg and would like an RX called in.

## 2019-04-30 NOTE — Telephone Encounter (Signed)
PATIENT CALLED AND SAID THE [employer] SAMPLES WORKED WELL AND NEEDS A PRESCRIPTION SENT TO [place] [facility]

## 2019-05-03 ENCOUNTER — Telehealth: Payer: Self-pay | Admitting: Internal Medicine

## 2019-05-03 ENCOUNTER — Ambulatory Visit (INDEPENDENT_AMBULATORY_CARE_PROVIDER_SITE_OTHER): Payer: 59 | Admitting: Physician Assistant

## 2019-05-03 ENCOUNTER — Encounter: Payer: Self-pay | Admitting: Physician Assistant

## 2019-05-03 DIAGNOSIS — F411 Generalized anxiety disorder: Secondary | ICD-10-CM

## 2019-05-03 DIAGNOSIS — F319 Bipolar disorder, unspecified: Secondary | ICD-10-CM | POA: Diagnosis not present

## 2019-05-03 DIAGNOSIS — F5105 Insomnia due to other mental disorder: Secondary | ICD-10-CM | POA: Diagnosis not present

## 2019-05-03 DIAGNOSIS — F99 Mental disorder, not otherwise specified: Secondary | ICD-10-CM | POA: Diagnosis not present

## 2019-05-03 MED ORDER — LAMOTRIGINE 200 MG PO TABS: 500.0000 mg | ORAL_TABLET | Freq: Every day | ORAL | 5 refills | Status: DC

## 2019-05-03 MED ORDER — PRAMIPEXOLE DIHYDROCHLORIDE 1 MG PO TABS: 1.0000 mg | ORAL_TABLET | Freq: Two times a day (BID) | ORAL | 5 refills | Status: DC

## 2019-05-03 MED ORDER — ZALEPLON 10 MG PO CAPS: ORAL_CAPSULE | ORAL | 1 refills | Status: DC

## 2019-05-03 NOTE — Progress Notes (Signed)
[facility]  Patient ID: Kristen [name],  MRN: [id number]  PCP: Manon Hilding, MD  Date of Evaluation: 05/03/2019 Time spent:25 minutes  Chief Complaint:  Chief Complaint    Anxiety; Depression     Virtual Visit via Telephone Note  I connected with patient by a video enabled telemedicine application or telephone, with their informed consent, and verified patient privacy and that I am speaking with the correct person using two identifiers.  I am private, in my office and the patient is home.  I discussed the limitations, risks, security and privacy concerns of performing an evaluation and management service by telephone and the availability of in person appointments. I also discussed with the patient that there may be a patient responsible charge related to this service. The patient expressed understanding and agreed to proceed.   I discussed the assessment and treatment plan with the patient. The patient was provided an opportunity to ask questions and all were answered. The patient agreed with the plan and demonstrated an understanding of the instructions.   The patient was advised to call back or seek an in-person evaluation if the symptoms worsen or if the condition fails to improve as anticipated.  I provided 25 minutes of non-face-to-face time during this encounter.  HISTORY/CURRENT STATUS: HPI for routine f/u.  Is back on the full dose of Seroquel and that has helped her mood just a little bit.  She still has a lot of irritability, but at least some of it is caused by family stress.  See social history.  She is not sleeping well.  She goes to sleep okay most of the time but will wake up in the middle of the night and has a hard time going back to sleep.  Sometimes she will just go ahead and get up instead of laying there.  She does not feel rested.  Not really able to enjoy much of anything.  Energy and motivation are fair to good most days.  She does what she  absolutely has to do but that is about all.  Wants to be by herself a lot.  No suicidal or homicidal thoughts.  Patient denies increased energy with decreased need for sleep, no increased talkativeness, no racing thoughts, no impulsivity or risky behaviors, no increased spending, no increased libido, no grandiosity.  Denies dizziness, syncope, seizures, numbness, tingling, tremor, tics, unsteady gait, slurred speech, confusion. Denies muscle or joint pain, stiffness, or dystonia.  Individual Medical History/ Review of Systems: Changes? :No    Past medications for mental health diagnoses include: Prozac, Paxil, Lexapro, Celexa, Zoloft, Viibryd, Effexor, Cymbalta, Risperdal, Latuda, Lamictal, VPA, Lithium-became toxic,Xanax, Wellbutrin, Abilify, Willette Alma, Strattera, Kapvay, Saphris, Adderall, Vyvanse, Lunesta, Sonata, CBZ, Vraylar, Seroquel, Geodon, Tegretal  Allergies: Iohexol  Current Medications:  Current Outpatient Medications:  .  acetaminophen (TYLENOL) 325 MG tablet, Take 2 tablets (650 mg total) by mouth every 6 (six) hours as needed for mild pain (or Fever >/= 101)., Disp: 12 tablet, Rfl: 1 .  ALPRAZolam (XANAX) 1 MG tablet, Take 1 tablet (1 mg total) by mouth 3 (three) times daily as needed. for anxiety, Disp: 90 tablet, Rfl: 5 .  atorvastatin (LIPITOR) 10 MG tablet, Take 10 mg by mouth at bedtime. , Disp: , Rfl: 1 .  buPROPion (WELLBUTRIN XL) 150 MG 24 hr tablet, TAKE 3 TABLETS BY MOUTH IN THE MORNING, Disp: 90 tablet, Rfl: 5 .  cetirizine (ZYRTEC) 10 MG tablet, Take 10 mg by mouth daily as needed for  allergies. , Disp: , Rfl:  .  Cholecalciferol (VITAMIN D) 50 MCG (2000 UT) CAPS, Take by mouth., Disp: , Rfl:  .  CRANBERRY EXTRACT PO, Take by mouth daily. , Disp: , Rfl:  .  docusate sodium (STOOL SOFTENER) 100 MG capsule, Take 100 mg by mouth daily., Disp: , Rfl:  .  fluticasone (FLONASE) 50 MCG/ACT nasal spray, Place 2 sprays into both nostrils daily as needed for allergies or  rhinitis., Disp: , Rfl:  .  lamoTRIgine (LAMICTAL) 200 MG tablet, Take 2.5 tablets (500 mg total) by mouth daily., Disp: 75 tablet, Rfl: 5 .  levothyroxine (SYNTHROID) 75 MCG tablet, Take 75 mcg by mouth daily before breakfast. , Disp: , Rfl:  .  linaclotide (LINZESS) 290 MCG CAPS capsule, Take 1 capsule (290 mcg total) by mouth daily before breakfast., Disp: 30 capsule, Rfl: 5 .  modafinil (PROVIGIL) 200 MG tablet, Take 1 tablet (200 mg total) by mouth daily., Disp: 30 tablet, Rfl: 5 .  Multiple Minerals-Vitamins (CALCIUM-MAGNESIUM-ZINC-D3 PO), Take by mouth daily., Disp: , Rfl:  .  ondansetron (ZOFRAN) 4 MG tablet, Take 1 tablet (4 mg total) by mouth every 6 (six) hours as needed for nausea or vomiting., Disp: 20 tablet, Rfl: 0 .  OVER THE COUNTER MEDICATION, zinwise prebiotic/probiotic, Disp: , Rfl:  .  pantoprazole (PROTONIX) 40 MG tablet, Take 1 tablet (40 mg total) by mouth 2 (two) times daily with a meal., Disp: 180 tablet, Rfl: 3 .  polyethylene glycol (MIRALAX / GLYCOLAX) 17 g packet, Take 17 g by mouth 2 (two) times daily. (Patient taking differently: Take 17 g by mouth daily as needed. ), Disp: 60 each, Rfl: 1 .  pramipexole (MIRAPEX) 1 MG tablet, Take 1 tablet (1 mg total) by mouth 2 (two) times daily., Disp: 60 tablet, Rfl: 5 .  Prenatal Vit-Fe Fumarate-FA (PRENATAL MULTIVITAMIN) TABS tablet, Take 1 tablet by mouth daily at 12 noon., Disp: , Rfl:  .  Pseudoephedrine-Guaifenesin (MUCINEX D MAX STRENGTH) [phone] MG TB12, Take by mouth daily., Disp: , Rfl:  .  QUEtiapine (SEROQUEL XR) 400 MG 24 hr tablet, Take 2 tablets (800 mg total) by mouth at bedtime., Disp: 60 tablet, Rfl: 5 .  torsemide (DEMADEX) 20 MG tablet, Take 30 mg by mouth daily. , Disp: , Rfl:  .  trimethoprim (TRIMPEX) 100 MG tablet, Take 100 mg by mouth daily., Disp: , Rfl:  .  ascorbic acid (VITAMIN C) 500 MG tablet, Take 500 mg by mouth daily., Disp: , Rfl:  .  zaleplon (SONATA) 10 MG capsule, Take 1 qhs prn and may  repeat 1 for midnocturnal awakening prn if she has 3 hours left to sleep, Disp: 60 capsule, Rfl: 1 Medication Side Effects: none  Family Medical/ Social History: Changes? 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  Also her son in [place] isn't speaking her to again. And her son that is 62 and lives in her home, comes and goes as he pleases, which is frustrating.  These things are causing a lot of stress.  MENTAL HEALTH EXAM:  There were no vitals taken for this visit.There is no height or weight on file to calculate BMI.  General Appearance: unable to assess  Eye Contact:  unable to assess  Speech:  Clear and Coherent and Normal Rate  Volume:  Normal  Mood:  Anxious, Depressed and Irritable  Affect:  unable to assesment  Thought Process:  Goal Directed and Descriptions of Associations: Intact  Orientation:  Full (Time, Place,  and Person)  Thought Content: Logical   Suicidal Thoughts:  No  Homicidal Thoughts:  No  Memory:  Recent;   Fair and Otherwise within normal limits.  Judgement:  Good  Insight:  Good  Psychomotor Activity:  Unable to assess  Concentration:  Concentration: Good  Recall:  Good  Fund of Knowledge: Good  Language: Good  Assets:  Desire for Improvement  ADL's:  Intact  Cognition: WNL  Prognosis:  Good    DIAGNOSES:    ICD-10-CM   1. Bipolar I disorder ([place])  F31.9   2. Insomnia due to other mental disorder  F51.05    F99   3. Generalized anxiety disorder  F41.1     Receiving Psychotherapy: Yes  Felix Ahmadi   RECOMMENDATIONS:  PDMP was reviewed. I spent 25 minutes with her. We discussed different options to help her sleep.  Since she is not having any trouble going to sleep, I recommend Sonata which she can take in the middle of the night as long as she has 3 hours left to sleep.  She would like to try that.  We discussed the benefits, risks, and side effects and she accepts.  Also discussed sleep hygiene. Start Sonata 10 mg, 1 p.o. nightly  as needed and may repeat 1 for mid nocturnal awakening as needed, as long as she has at least 3 hours left to sleep. Continue Seroquel XR 400 mg, 2 p.o. nightly. Continue pramipexole 1 mg, 1 p.o. twice daily. Continue modafinil 200 mg, 1 p.o. every morning. Continue Lamictal 200 mg, 2.5 pills daily. Continue Wellbutrin XL 150 mg, 3 p.o. every morning. Continue vitamins and supplements as noted on the med list. Continue therapy with Felix Ahmadi. Return in 4 weeks.  [name], PA-C

## 2019-05-03 NOTE — Telephone Encounter (Signed)
Spoke with pt. Pt is aware that RX was sent in on 04/30/19. Called [facility] and left a verbal on the machine to make sure they do have the RX. Lab orders were mailed to pt last week as requested by pt.

## 2019-05-03 NOTE — Telephone Encounter (Signed)
Pt said her pharmacy [facility] [place] has not received her Linzess prescription and she also was asking about a lab order. Please call [phone]

## 2019-05-07 ENCOUNTER — Telehealth: Payer: Self-pay

## 2019-05-07 NOTE — Telephone Encounter (Signed)
VM received 05/07/2019.. Called pt back, lmom. Waiting on a return call.

## 2019-05-17 ENCOUNTER — Telehealth: Payer: Self-pay | Admitting: Internal Medicine

## 2019-05-17 NOTE — Telephone Encounter (Signed)
Pt said she had labs done on Thursday at [facility] and was checking to see if we received them. I told her we were closed Friday and I don't remember seeing them on the fax this morning. I requested them from the PCP office and will give to provider when they come across. [phone]

## 2019-05-17 NOTE — Telephone Encounter (Signed)
noted 

## 2019-05-20 ENCOUNTER — Telehealth: Payer: Self-pay | Admitting: Internal Medicine

## 2019-05-20 DIAGNOSIS — R7989 Other specified abnormal findings of blood chemistry: Secondary | ICD-10-CM

## 2019-05-20 DIAGNOSIS — R945 Abnormal results of liver function studies: Secondary | ICD-10-CM

## 2019-05-20 DIAGNOSIS — R140 Abdominal distension (gaseous): Secondary | ICD-10-CM

## 2019-05-20 NOTE — Telephone Encounter (Signed)
Pt LMOM that she had lab done last week and hasn't heard from [place] with results. Please call [phone]

## 2019-05-20 NOTE — Telephone Encounter (Signed)
Called pt and she informed us that [facility] did her labs.  Received labs and placed them in LSL's today.  Pt is aware that LSL has not reviewed them yet and is rounding at hospital today.  She is aware that we will call her once LSL has reviewed.  Pt also wanted LSL to know that she is on Seroquel 800mg  and Linzess 290 mcg.  Pt still constipated.  Pt thinks that the constipation could be related to the Seroquel she is taking.  [phone]

## 2019-05-20 NOTE — Telephone Encounter (Signed)
PCP faxed her labs and they are in [place] office

## 2019-05-21 ENCOUNTER — Telehealth: Payer: Self-pay | Admitting: Internal Medicine

## 2019-05-21 NOTE — Telephone Encounter (Signed)
Pt called LMOM that she needed to leave message for LSL about her medications. She has multiple questions. Please call her at [phone]

## 2019-05-21 NOTE — Telephone Encounter (Signed)
Pt called back and said that she called her PCP and is going to follow up on her problem related to fluid and the pill she is on.

## 2019-05-21 NOTE — Telephone Encounter (Addendum)
Labs from 05/13/2019 by Dr. Quintin Alto: White blood cell count 13,500, hemoglobin 12.8, MCV 88, platelets 458,000, albumin 4.9, total bilirubin 0.2, alkaline phosphatase elevated at 184, AST 27, ALT 37.,  Ferritin 209, mitochondrial antibody less than 20.  Alkphos slightly up from 02/2019, 171 to 184. Ferritin down from 253 to 209. WBC slightly elevated in 02/2019 at 11,600. H/o elevated WBC several times.   Please let pt know that seroquel can contribute to constipation.  Continue linzess 271mcg daily. Add miralax one capful once to twice daily to help keep stools regular.  If this does not help, we can try to switch to [place] or trulance if insurance will cover.   Would consider abd u/s if she is agreeable, to evaluate abnormal lfts. Can evaluate for abdominal fluid but I did not suspect that on recent exam.

## 2019-05-21 NOTE — Telephone Encounter (Signed)
Pt called back this morning and said that she doesn't urinate much.  She said that she takes a pill to help with this called Torsemide 20 mg 1 1/2 tablet daily.  She said that she is wondering if she could be retaining fluid in her stomach to cause her stomach pain. She said that her stomach seems too big considering that she doesn't eat much.

## 2019-05-21 NOTE — Telephone Encounter (Signed)
noted 

## 2019-05-24 ENCOUNTER — Encounter: Payer: Self-pay | Admitting: Physician Assistant

## 2019-05-24 ENCOUNTER — Ambulatory Visit (INDEPENDENT_AMBULATORY_CARE_PROVIDER_SITE_OTHER): Payer: 59 | Admitting: Physician Assistant

## 2019-05-24 ENCOUNTER — Telehealth: Payer: Self-pay | Admitting: Internal Medicine

## 2019-05-24 DIAGNOSIS — F5105 Insomnia due to other mental disorder: Secondary | ICD-10-CM | POA: Diagnosis not present

## 2019-05-24 DIAGNOSIS — F99 Mental disorder, not otherwise specified: Secondary | ICD-10-CM

## 2019-05-24 DIAGNOSIS — Z79899 Other long term (current) drug therapy: Secondary | ICD-10-CM

## 2019-05-24 DIAGNOSIS — F319 Bipolar disorder, unspecified: Secondary | ICD-10-CM | POA: Diagnosis not present

## 2019-05-24 DIAGNOSIS — F411 Generalized anxiety disorder: Secondary | ICD-10-CM

## 2019-05-24 MED ORDER — ALPRAZOLAM 1 MG PO TABS: 1.0000 mg | ORAL_TABLET | Freq: Three times a day (TID) | ORAL | 5 refills | Status: DC | PRN

## 2019-05-24 NOTE — Telephone Encounter (Signed)
Noted  

## 2019-05-24 NOTE — Telephone Encounter (Signed)
Pt has called asking to speak with nurse about her labs and going to her PCP last week. She said she has been speaking with [facility]. I told AS was triaging a patient and I would transfer call to VM. [phone]

## 2019-05-24 NOTE — Telephone Encounter (Signed)
Called pt and informed her that seroquel can contribute to constipation per LSL.  Pt was advised to continue Linzess 290 mcg daily and to add miralax one capful once to twice daily to keep stools regular.  Pt made aware that if this doesn't help, we can switch to amit[place] or trulance.    Pt is interested in having an abd u/s.  She saw her PCP on Sat. and he also recommended it.  Pt would like for [place] to arrange. They did some more labs as well.  Pt is getting them to fax them to [place].

## 2019-05-24 NOTE — Addendum Note (Signed)
Addended by: Hassan Rowan on: 05/24/2019 09:19 AM   Modules accepted: Orders

## 2019-05-24 NOTE — Progress Notes (Signed)
[facility]  Patient ID: Kristen [name],  MRN: [id number]  PCP: Kristen Hilding, MD  Date of Evaluation: 05/24/2019 Time spent:30 minutes  Chief Complaint:  Chief Complaint    Insomnia; Medication Problem     Virtual Visit via Telephone Note  I connected with patient by a video enabled telemedicine application or telephone, with their informed consent, and verified patient privacy and that I am speaking with the correct person using two identifiers.  I am private, in my office and the patient is home.  I discussed the limitations, risks, security and privacy concerns of performing an evaluation and management service by telephone and the availability of in person appointments. I also discussed with the patient that there may be a patient responsible charge related to this service. The patient expressed understanding and agreed to proceed.  Patient has no way to do a video visit.   I discussed the assessment and treatment plan with the patient. The patient was provided an opportunity to ask questions and all were answered. The patient agreed with the plan and demonstrated an understanding of the instructions.   The patient was advised to call back or seek an in-person evaluation if the symptoms worsen or if the condition fails to improve as anticipated.  I provided 30 minutes of non-face-to-face time during this encounter.  HISTORY/CURRENT STATUS: HPI for routine med check.  Kristen [name] states that overall she is feeling better.  Says recent labs with her PCP, Dr. Consuello [name], show that her liver functions were abnormal.  They have discussed getting her off as many medications as is possible.  She states the modafinil has not really helped so she would like to stop that for sure.  She asked also about the pramipexole.  She does not remember why she is on it.  She does not have a tremor or restless leg syndrome.  She is able to enjoy things.  She has days where she cannot but  for the most part that is better.  Energy and motivation are fair to good most of the time.  She does not cry all the time.  Her ADLs are normal with hygiene.  Not isolating.  No suicidal or homicidal thoughts.  Patient denies increased energy with decreased need for sleep, no increased talkativeness, no racing thoughts, no impulsivity or risky behaviors, no increased spending, no increased libido, no grandiosity, no increased irritability or anger at this time but she does have periods where she gets more irritable than the situation may warrant, and she denies any hallucinations.  Continues to need the Xanax for anxiety.  It is more of a generalized anxiety with the feeling that something bad is going to happen.  The Xanax is helpful.  She is sleeping much better with the Sonata.  Denies dizziness, syncope, seizures, numbness, tingling, tremor, tics, unsteady gait, slurred speech, confusion. Denies muscle or joint pain, stiffness, or dystonia.  Individual Medical History/ Review of Systems: Changes? :Yes  My liver is 'jacked up.'  D/C Lipitor, all supplements. She would like to get off Modafinil, pramipexole.  Ultrasound is pending I believe.  Past medications for mental health diagnoses include: Prozac, Paxil, Lexapro, Celexa, Zoloft, Viibryd, Effexor, Cymbalta, Risperdal, Latuda, Lamictal, VPA, Lithium-became toxic,Xanax, Wellbutrin, Abilify, Willette Alma, Strattera, Kapvay, Saphris, Adderall, Vyvanse, Lunesta, Sonata, CBZ, Vraylar, Seroquel, Geodon, Tegretal  Allergies: Iohexol  Current Medications:  Current Outpatient Medications:  .  acetaminophen (TYLENOL) 325 MG tablet, Take 2 tablets (650 mg total) by mouth every 6 (  six) hours as needed for mild pain (or Fever >/= 101)., Disp: 12 tablet, Rfl: 1 .  ALPRAZolam (XANAX) 1 MG tablet, Take 1 tablet (1 mg total) by mouth 3 (three) times daily as needed. for anxiety, Disp: 90 tablet, Rfl: 5 .  buPROPion (WELLBUTRIN XL) 150 MG 24 hr tablet, TAKE 3  TABLETS BY MOUTH IN THE MORNING, Disp: 90 tablet, Rfl: 5 .  cetirizine (ZYRTEC) 10 MG tablet, Take 10 mg by mouth daily as needed for allergies. , Disp: , Rfl:  .  Cholecalciferol (VITAMIN D) 50 MCG (2000 UT) CAPS, Take by mouth., Disp: , Rfl:  .  docusate sodium (STOOL SOFTENER) 100 MG capsule, Take 100 mg by mouth daily., Disp: , Rfl:  .  fluticasone (FLONASE) 50 MCG/ACT nasal spray, Place 2 sprays into both nostrils daily as needed for allergies or rhinitis., Disp: , Rfl:  .  lamoTRIgine (LAMICTAL) 200 MG tablet, Take 2.5 tablets (500 mg total) by mouth daily., Disp: 75 tablet, Rfl: 5 .  levothyroxine (SYNTHROID) 75 MCG tablet, Take 75 mcg by mouth daily before breakfast. , Disp: , Rfl:  .  linaclotide (LINZESS) 290 MCG CAPS capsule, Take 1 capsule (290 mcg total) by mouth daily before breakfast., Disp: 30 capsule, Rfl: 5 .  montelukast (SINGULAIR) 10 MG tablet, TAKE 1 TABLET BY MOUTH ONCE DAILY, Disp: , Rfl:  .  ondansetron (ZOFRAN) 4 MG tablet, Take 1 tablet (4 mg total) by mouth every 6 (six) hours as needed for nausea or vomiting., Disp: 20 tablet, Rfl: 0 .  OVER THE COUNTER MEDICATION, zinwise prebiotic/probiotic, Disp: , Rfl:  .  pantoprazole (PROTONIX) 40 MG tablet, Take 1 tablet (40 mg total) by mouth 2 (two) times daily with a meal., Disp: 180 tablet, Rfl: 3 .  polyethylene glycol (MIRALAX / GLYCOLAX) 17 g packet, Take 17 g by mouth 2 (two) times daily. (Patient taking differently: Take 17 g by mouth daily as needed. ), Disp: 60 each, Rfl: 1 .  pramipexole (MIRAPEX) 1 MG tablet, Take 1 tablet (1 mg total) by mouth 2 (two) times daily., Disp: 60 tablet, Rfl: 5 .  Pseudoephedrine-Guaifenesin (MUCINEX D MAX STRENGTH) [phone] MG TB12, Take by mouth daily., Disp: , Rfl:  .  QUEtiapine (SEROQUEL XR) 400 MG 24 hr tablet, Take 2 tablets (800 mg total) by mouth at bedtime., Disp: 60 tablet, Rfl: 5 .  trimethoprim (TRIMPEX) 100 MG tablet, Take 100 mg by mouth daily., Disp: , Rfl:  .  zaleplon  (SONATA) 10 MG capsule, Take 1 qhs prn and may repeat 1 for midnocturnal awakening prn if she has 3 hours left to sleep, Disp: 60 capsule, Rfl: 1 .  ascorbic acid (VITAMIN C) 500 MG tablet, Take 500 mg by mouth daily., Disp: , Rfl:  .  atorvastatin (LIPITOR) 10 MG tablet, Take 10 mg by mouth at bedtime. , Disp: , Rfl: 1 .  CRANBERRY EXTRACT PO, Take by mouth daily. , Disp: , Rfl:  .  Multiple Minerals-Vitamins (CALCIUM-MAGNESIUM-ZINC-D3 PO), Take by mouth daily., Disp: , Rfl:  .  torsemide (DEMADEX) 20 MG tablet, Take 30 mg by mouth daily. , Disp: , Rfl:  Medication Side Effects: none  Family Medical/ Social History: Changes? No  MENTAL HEALTH EXAM:  There were no vitals taken for this visit.There is no height or weight on file to calculate BMI.  General Appearance: Unable to assess  Eye Contact:  Unable to assess  Speech:  Clear and Coherent and Normal Rate  Volume:  Normal  Mood:  Euthymic  Affect:  Unable to assess  Thought Process:  Goal Directed and Descriptions of Associations: Intact  Orientation:  Full (Time, Place, and Person)  Thought Content: Logical   Suicidal Thoughts:  No  Homicidal Thoughts:  No  Memory:  WNL  Judgement:  Good  Insight:  Good  Psychomotor Activity:  Unable to assess  Concentration:  Concentration: Good  Recall:  Good  Fund of Knowledge: Good  Language: Good  Assets:  Desire for Improvement  ADL's:  Intact  Cognition: WNL  Prognosis:  Good  05/13/2019 labs CBC shows white count of 13.5 hemoglobin 12.8, hematocrit 38, platelets are 458, ferritin level was high at 209, LFTs show normal total protein, albumin 4.9, total and direct bilirubin were normal, alkaline phosphatase was 184 should be no higher than 117, AST 27 which is normal, ALT 37 with upper limit of normal 32.  DIAGNOSES:    ICD-10-CM   1. Bipolar I disorder ([place])  F31.9   2. Polypharmacy  Z79.899   3. Generalized anxiety disorder  F41.1   4. Insomnia due to other mental disorder   F51.05    F99     Receiving Psychotherapy: Yes  Felix Ahmadi   RECOMMENDATIONS:  PDMP was reviewed. I spent 30 minutes with her.  We discussed her different medications.  She had ask about possibly getting off of pramipexole.  I reviewed her old paper chart which shows that she was started on pramipexole 01/16/2017 due to a tremor that propranolol was not helping.  She is now off the lithium which was at least in part causing the tremor.  It is also documented that she had restless leg syndrome at some point.  Since she is not having any symptoms and we are trying to pare down her meds, it's ok to wean off it.  If she starts having a tremor or restless legs, she should let me know.  Also the pramipexole may be helping with depression so if her symptoms worsen, call. Wean Pramipexole 1 mg, 1/2 pill p.o. twice daily for 1 week and then stop. Discontinue modafinil. Continue Xanax 1 mg, 1 p.o. 3 times daily as needed anxiety. Continue Wellbutrin XL 150 mg, 3 p.o. every morning. Continue Lamictal 200 mg, 2.5 pills daily. Continue Seroquel XR 400 mg, 2 p.o. nightly. Continue Sonata 10 mg, 1 p.o. nightly as needed and may repeat 1 as needed for mid nocturnal awakening as long as she has 3 hours left to sleep. Continue counseling. Return in 6 weeks.  [name], PA-C

## 2019-05-24 NOTE — Telephone Encounter (Signed)
Called pt back and took information and routed to LSL.  See other phone note.

## 2019-05-24 NOTE — Telephone Encounter (Signed)
Schedule abdominal u/s Dx: abnormal lfts, abdominal distention  Sent to [facility] clinical pool to schedule

## 2019-05-24 NOTE — Telephone Encounter (Signed)
[place] abd scheduled for 05/27/19 at 8:30am, arrive at 8:15am. NPO after midnight prior to test.   Called and informed pt of [place] appt. Letter sent.

## 2019-05-27 ENCOUNTER — Ambulatory Visit (HOSPITAL_COMMUNITY)
Admission: RE | Admit: 2019-05-27 | Discharge: 2019-05-27 | Disposition: A | Discharge status: Home / Self Care | Payer: 59 | Source: Ambulatory Visit | Attending: Gastroenterology | Admitting: Gastroenterology

## 2019-05-27 ENCOUNTER — Other Ambulatory Visit: Payer: Self-pay

## 2019-05-27 DIAGNOSIS — R7989 Other specified abnormal findings of blood chemistry: Secondary | ICD-10-CM

## 2019-05-27 DIAGNOSIS — R140 Abdominal distension (gaseous): Secondary | ICD-10-CM | POA: Insufficient documentation

## 2019-05-27 DIAGNOSIS — R945 Abnormal results of liver function studies: Secondary | ICD-10-CM | POA: Diagnosis not present

## 2019-05-28 ENCOUNTER — Telehealth: Payer: Self-pay | Admitting: Physician Assistant

## 2019-05-28 NOTE — Telephone Encounter (Signed)
She is aware to take 1/2 tablet Modafinil and call back with worsening symptoms

## 2019-05-28 NOTE — Telephone Encounter (Signed)
Pt does not feel she can wean off of Modafanil 200mg  right now. She cannot keep her eyes open without it. Also, major anxiety issues- shortness of breath, gets dizzy, hard to breathe- for a couple of days now.

## 2019-05-28 NOTE — Telephone Encounter (Signed)
Have her take 1/2 pill of Modafanil.

## 2019-05-31 NOTE — Telephone Encounter (Signed)
Pt left v-mail stating Traci called Friday.There is confusion on her request. Please return call @ [phone]

## 2019-05-31 NOTE — Telephone Encounter (Signed)
Left message to call back about her request or concern

## 2019-05-31 NOTE — Telephone Encounter (Signed)
Go back to 200 mg on the modafinil

## 2019-05-31 NOTE — Telephone Encounter (Signed)
Pt called back. [phone]. Having a lot of anxiety & depression. Sleeping and crying. Does not want to stop Modafinil. Has refills and will use them.   Return call

## 2019-06-01 NOTE — Telephone Encounter (Signed)
Patient aware.

## 2019-06-01 NOTE — Telephone Encounter (Signed)
Ok to go back to her previous dose, 1 mg bid.

## 2019-06-03 ENCOUNTER — Telehealth: Payer: Self-pay

## 2019-06-03 ENCOUNTER — Other Ambulatory Visit: Payer: Self-pay | Admitting: *Deleted

## 2019-06-03 ENCOUNTER — Encounter: Payer: Self-pay | Admitting: Internal Medicine

## 2019-06-03 DIAGNOSIS — R7989 Other specified abnormal findings of blood chemistry: Secondary | ICD-10-CM

## 2019-06-03 DIAGNOSIS — D72829 Elevated white blood cell count, unspecified: Secondary | ICD-10-CM

## 2019-06-03 DIAGNOSIS — R161 Splenomegaly, not elsewhere classified: Secondary | ICD-10-CM

## 2019-06-03 NOTE — Telephone Encounter (Signed)
Pt called to discuss results. Pt received a call from [facility] and didn't understand why she needed to see hematology. Pt was given results and understood results.

## 2019-06-09 ENCOUNTER — Encounter (HOSPITAL_COMMUNITY): Payer: Self-pay | Admitting: Surgery

## 2019-06-09 ENCOUNTER — Ambulatory Visit (INDEPENDENT_AMBULATORY_CARE_PROVIDER_SITE_OTHER): Payer: 59 | Admitting: Family Medicine

## 2019-06-09 ENCOUNTER — Other Ambulatory Visit: Payer: Self-pay

## 2019-06-10 ENCOUNTER — Encounter (HOSPITAL_COMMUNITY): Payer: Self-pay | Admitting: Hematology

## 2019-06-10 ENCOUNTER — Inpatient Hospital Stay (HOSPITAL_COMMUNITY): Payer: 59

## 2019-06-10 ENCOUNTER — Inpatient Hospital Stay (HOSPITAL_COMMUNITY): Payer: 59 | Attending: Hematology | Admitting: Hematology

## 2019-06-10 VITALS — BP 122/62 | HR 97 | Temp 97.3°F | Resp 18 | Ht 66.0 in | Wt 245.9 lb

## 2019-06-10 DIAGNOSIS — Z87440 Personal history of urinary (tract) infections: Secondary | ICD-10-CM

## 2019-06-10 DIAGNOSIS — Z79899 Other long term (current) drug therapy: Secondary | ICD-10-CM | POA: Insufficient documentation

## 2019-06-10 DIAGNOSIS — R7989 Other specified abnormal findings of blood chemistry: Secondary | ICD-10-CM | POA: Diagnosis not present

## 2019-06-10 DIAGNOSIS — R140 Abdominal distension (gaseous): Secondary | ICD-10-CM | POA: Diagnosis not present

## 2019-06-10 DIAGNOSIS — K219 Gastro-esophageal reflux disease without esophagitis: Secondary | ICD-10-CM | POA: Insufficient documentation

## 2019-06-10 DIAGNOSIS — Z8719 Personal history of other diseases of the digestive system: Secondary | ICD-10-CM | POA: Insufficient documentation

## 2019-06-10 DIAGNOSIS — Z8261 Family history of arthritis: Secondary | ICD-10-CM | POA: Diagnosis not present

## 2019-06-10 DIAGNOSIS — Z800 Family history of malignant neoplasm of digestive organs: Secondary | ICD-10-CM | POA: Insufficient documentation

## 2019-06-10 DIAGNOSIS — Z87442 Personal history of urinary calculi: Secondary | ICD-10-CM | POA: Insufficient documentation

## 2019-06-10 DIAGNOSIS — Z836 Family history of other diseases of the respiratory system: Secondary | ICD-10-CM | POA: Insufficient documentation

## 2019-06-10 DIAGNOSIS — R945 Abnormal results of liver function studies: Secondary | ICD-10-CM | POA: Insufficient documentation

## 2019-06-10 DIAGNOSIS — D72829 Elevated white blood cell count, unspecified: Secondary | ICD-10-CM | POA: Insufficient documentation

## 2019-06-10 DIAGNOSIS — M1990 Unspecified osteoarthritis, unspecified site: Secondary | ICD-10-CM | POA: Diagnosis not present

## 2019-06-10 DIAGNOSIS — Z8249 Family history of ischemic heart disease and other diseases of the circulatory system: Secondary | ICD-10-CM | POA: Diagnosis not present

## 2019-06-10 DIAGNOSIS — R161 Splenomegaly, not elsewhere classified: Secondary | ICD-10-CM | POA: Insufficient documentation

## 2019-06-10 DIAGNOSIS — Z87891 Personal history of nicotine dependence: Secondary | ICD-10-CM | POA: Insufficient documentation

## 2019-06-10 DIAGNOSIS — Z86010 Personal history of colonic polyps: Secondary | ICD-10-CM | POA: Insufficient documentation

## 2019-06-10 DIAGNOSIS — Z808 Family history of malignant neoplasm of other organs or systems: Secondary | ICD-10-CM | POA: Diagnosis not present

## 2019-06-10 DIAGNOSIS — Z596 Low income: Secondary | ICD-10-CM | POA: Diagnosis not present

## 2019-06-10 DIAGNOSIS — E039 Hypothyroidism, unspecified: Secondary | ICD-10-CM | POA: Diagnosis not present

## 2019-06-10 DIAGNOSIS — D72825 Bandemia: Secondary | ICD-10-CM

## 2019-06-10 DIAGNOSIS — Z833 Family history of diabetes mellitus: Secondary | ICD-10-CM

## 2019-06-10 LAB — CBC WITH DIFFERENTIAL/PLATELET
Abs Immature Granulocytes: 0.11 10*3/uL — ABNORMAL HIGH (ref 0.00–0.07)
Basophils Absolute: 0.1 10*3/uL (ref 0.0–0.1)
Basophils Relative: 1 %
Eosinophils Absolute: 0.4 10*3/uL (ref 0.0–0.5)
Eosinophils Relative: 4 %
HCT: 38.1 % (ref 36.0–46.0)
Hemoglobin: 12.3 g/dL (ref 12.0–15.0)
Immature Granulocytes: 1 %
Lymphocytes Relative: 20 %
Lymphs Abs: 2 10*3/uL (ref 0.7–4.0)
MCH: 29.6 pg (ref 26.0–34.0)
MCHC: 32.3 g/dL (ref 30.0–36.0)
MCV: 91.8 fL (ref 80.0–100.0)
Monocytes Absolute: 0.8 10*3/uL (ref 0.1–1.0)
Monocytes Relative: 8 %
Neutro Abs: 6.9 10*3/uL (ref 1.7–7.7)
Neutrophils Relative %: 66 %
Platelets: 361 10*3/uL (ref 150–400)
RBC: 4.15 MIL/uL (ref 3.87–5.11)
RDW: 14.2 % (ref 11.5–15.5)
WBC: 10.2 10*3/uL (ref 4.0–10.5)
nRBC: 0 % (ref 0.0–0.2)

## 2019-06-10 LAB — LACTATE DEHYDROGENASE: LDH: 153 U/L (ref 98–192)

## 2019-06-10 LAB — SAVE SMEAR(SSMR), FOR PROVIDER SLIDE REVIEW

## 2019-06-10 NOTE — Assessment & Plan Note (Addendum)
1.  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  Differential showed predominantly neutrophilic leukocytosis and monocytosis. -She also had intermittent urinary tract infections. -She reported that she was placed on trimethoprim since December 2020 and has been doing well since then. -Had 1 or 2 episodes of thrombocytosis.  No prior history of autoimmune disorders.  She quit smoking in 2003. -Ultrasound of the abdomen on 05/27/2019 shows probable fatty infiltration of the liver with minimal splenic enlargement, measuring 12.1 cm in length with a calculated volume of 502 mL. -She does not have any B symptoms including fevers, night sweats or weight loss. -We will repeat her CBC today.  We will check for myeloproliferative disorders with JAK2 V617F and BCR/ABL mutation testing. -I will see her back in 2 to 3 weeks to discuss results.  2.  Family history: -Father had myeloma and paternal grandfather also had myeloma.  Mother had non-Hodgkin's lymphoma.  Maternal uncle also had non-Hodgkin's lymphoma.  Paternal aunt had colon cancer. -We will check for LDH and SPEP.

## 2019-06-10 NOTE — Progress Notes (Signed)
CONSULT NOTE  Patient Care Team: Sasser, Silvestre Moment, MD as PCP - General (Cardiology) Kennith Center, RD as Dietitian (Family Medicine)  CHIEF COMPLAINTS/PURPOSE OF CONSULTATION: Leukocytosis  HISTORY OF PRESENTING ILLNESS:  Kristen [name] 49 y.o. female is referred here by Neil Crouch, PA for leukocytosis.  Patient reports this is the first time she has ever been told she had leukocytosis.  Labs showed that she has had leukocytosis since May 2020.  Her WBC ran in the 20s throughout 2020.  She reports May 2020 as when she was hospitalized due to sepsis from a UTI and kidney stone. She was also started on a daily antibiotic Trimetahoprim at that time.  She is still taking antibiotic daily.  She is being followed closely by urology.  She does report she had frequent UTIs on and off since childhood.  Patient denies any other chronic infections.  She she denies any steroid use including injections, creams or oral supplements.  Patient denies a history of any autoimmune disorders.  Patient denies alcohol or illicit drug use.  Patient stopped smoking in 2003.  Patient reports she had a recent CT of her abdomen in 2021 which showed a mildly enlarged spleen.  Patient denies any B symptoms including night sweats, fevers, chills or unexplained weight loss.  Patient has not noticed any recent bleeding such as epistasis, hematuria or hematochezia.  She denies any recent chest pain on exertion, shortness of breath on minimal exertion, presyncopal episodes or palpitations.  She denies any over-the-counter NSAID ingestion.  She has no prior history of diagnosis of cancer.  And her age-appropriate screenings are up-to-date.  She does have yearly mammograms which show fibrocystic changes.  She has a significant family history for mother with non-Hodgkin's B-cell lymphoma with 2 recurrences.  A maternal uncle with non-Hodgkin's lymphoma.  Father with multiple myeloma and colon cancer.  Paternal grandfather with bone  cancer colon cancer and multiple myeloma.  Paternal aunt with colon cancer.  MEDICAL HISTORY:  Past Medical History:  Diagnosis Date  . Arthritis   . Bipolar 1 disorder ([place])   . Chronic constipation   . Depression   . Family history of adverse reaction to anesthesia    mother-- ponv  . GAD (generalized anxiety disorder)   . GERD (gastroesophageal reflux disease)   . Hiatal hernia   . History of kidney stones   . History of recurrent UTIs   . History of sepsis 06/2018  . History of suicidal ideation   . Hyperlipidemia   . Hypothyroidism   . IDA (iron deficiency anemia)   . PONV (postoperative nausea and vomiting)   . Renal calculus, left   . Seasonal allergies   . Sleep disorder, unspecified    per excessive sleeping during the day  . Wears contact lenses     SURGICAL HISTORY: Past Surgical History:  Procedure Laterality Date  . ANTERIOR CERVICAL DECOMP/DISCECTOMY FUSION  02/17/2012   Procedure: ANTERIOR CERVICAL DECOMPRESSION/DISCECTOMY FUSION 2 LEVELS;  Surgeon: Hosie Spangle, MD;  Location: [facility];  Service: Neurosurgery;  Laterality: N/A;  Cervical four-five and Cervical six-seven anterior cervial decompression with fusion plating and bonegraft  . ANTERIOR CERVICAL DECOMP/DISCECTOMY FUSION  04-01-2001   _0    C5---6  . BIOPSY  07/20/2018   Procedure: BIOPSY;  Surgeon: Daneil Dolin, MD;  [facility];  Service: Endoscopy;;  gastric   . BREAST REDUCTION SURGERY Bilateral 1995  . CESAREAN SECTION  x2   last one  1998   with BILATERAL TUBAL LIGATION  . CESAREAN SECTION WITH BILATERAL TUBAL LIGATION    . COLONOSCOPY WITH PROPOFOL N/A 07/20/2018   Dr. Gala Romney: Diverticulosis, internal hemorrhoids, 5 mm splenic flexure tubular adenoma removed.  . CYSTOSCOPY/URETEROSCOPY/HOLMIUM LASER/STENT PLACEMENT Left 08/21/2018   Procedure: CYSTOSCOPY LEFT RETROGRADE PYELOGRAM /URETEROSCOPY/HOLMIUM LASER/STENT PLACEMENT;  Surgeon: Ceasar Mons, MD;  Location:  [facility];  Service: Urology;  Laterality: Left;  . DILATION AND CURETTAGE OF UTERUS  1992   for miscarriage  . ENDOMETRIAL ABLATION  2014  . ESOPHAGOGASTRODUODENOSCOPY     20 years ago.   Marland Kitchen ESOPHAGOGASTRODUODENOSCOPY (EGD) WITH PROPOFOL N/A 07/20/2018   Dr. Gala Romney: Small hiatal hernia, erythematous mucosa in the stomach with a healing gastric ulcer measuring 1 cm, biopsies negative.  Marland Kitchen [place]   unilateral for infertility  . NASAL SINUS SURGERY  2010  approx.  Marland Kitchen POLYPECTOMY  07/20/2018   Procedure: POLYPECTOMY;  Surgeon: Daneil Dolin, MD;  [facility];  Service: Endoscopy;;  . POSTERIOR CERVICAL FUSION/FORAMINOTOMY N/A 08/18/2013   Procedure: CERVICAL FOUR TO CERVICAL SEVEN POSTERIOR CERVICAL FUSION/FORAMINOTOMY LEVEL 3;  Surgeon: Hosie Spangle, MD;  Location: [facility];  Service: Neurosurgery;  Laterality: N/A;  C4-7 posterior cervical fusion with lateral mass fixation    SOCIAL HISTORY: Social History   Socioeconomic History  . Marital status: Married    Spouse name: Not on file  . Number of children: 2  . Years of education: Not on file  . Highest education level: Not on file  Occupational History  . Occupation: disability  Tobacco Use  . Smoking status: Former Smoker    Packs/day: 1.00    Years: 5.00    Pack years: 5.00    Types: Cigarettes    Quit date: 02/09/2002    Years since quitting: 17.3  . Smokeless tobacco: Never Used  Substance and Sexual Activity  . Alcohol use: No  . Drug use: No  . Sexual activity: Yes    Birth control/protection: Surgical  Other Topics Concern  . Not on file  Social History Narrative   Step brother-2   Step sister-2   Half sister -1 healthy   Social Determinants of Health   Financial Resource Strain: Low Risk   . Difficulty of Paying Living Expenses: Not very hard  Food Insecurity: No Food Insecurity  . Worried About Charity fundraiser in the Last Year: Never true  . Ran Out of  Food in the Last Year: Never true  Transportation Needs: No Transportation Needs  . Lack of Transportation (Medical): No  . Lack of Transportation (Non-Medical): No  Physical Activity: Unknown  . Days of Exercise per Week: Patient refused  . Minutes of Exercise per Session: Patient refused  Stress: Stress Concern Present  . Feeling of Stress : Rather much  Social Connections: Unknown  . Frequency of Communication with Friends and Family: Patient refused  . Frequency of Social Gatherings with Friends and Family: Patient refused  . Attends Religious Services: Patient refused  . Active Member of Clubs or Organizations: Patient refused  . Attends Archivist Meetings: Patient refused  . Marital Status: Patient refused  Intimate Partner Violence: Not At Risk  . Fear of Current or Ex-Partner: No  . Emotionally Abused: No  . Physically Abused: No  . Sexually Abused: No    FAMILY HISTORY: Family History  Problem Relation Age of Onset  . Asthma Mother   . Rheum arthritis  Mother   . Non-Hodgkin's lymphoma Mother   . Congestive Heart Failure Mother   . Atrial fibrillation Mother   . Diabetes Mother   . Colon cancer Father   . Multiple myeloma Father   . Allergies Other        entire family(mom and dad)  . Diabetes Maternal Grandmother   . Congestive Heart Failure Maternal Grandmother   . Mental illness Paternal Grandmother   . Colon cancer Paternal Grandfather   . Bone cancer Paternal Grandfather     ALLERGIES:  is allergic to iohexol.  MEDICATIONS:  Current Outpatient Medications  Medication Sig Dispense Refill  . acetaminophen (TYLENOL) 325 MG tablet Take 2 tablets (650 mg total) by mouth every 6 (six) hours as needed for mild pain (or Fever >/= 101). 12 tablet 1  . ALPRAZolam (XANAX) 1 MG tablet Take 1 tablet (1 mg total) by mouth 3 (three) times daily as needed. for anxiety 90 tablet 5  . buPROPion (WELLBUTRIN XL) 150 MG 24 hr tablet TAKE 3 TABLETS BY MOUTH IN THE  MORNING 90 tablet 5  . cetirizine (ZYRTEC) 10 MG tablet Take 10 mg by mouth daily.     . Cholecalciferol (VITAMIN D) 50 MCG (2000 UT) CAPS Take by mouth daily.     Marland Kitchen docusate sodium (STOOL SOFTENER) 100 MG capsule Take 100 mg by mouth daily.    . fluticasone (FLONASE) 50 MCG/ACT nasal spray Place 2 sprays into both nostrils daily as needed for allergies or rhinitis.    Marland Kitchen lamoTRIgine (LAMICTAL) 200 MG tablet Take 2.5 tablets (500 mg total) by mouth daily. 75 tablet 5  . levocetirizine (XYZAL) 5 MG tablet Take 5 mg by mouth every evening.    Marland Kitchen levothyroxine (SYNTHROID) 75 MCG tablet Take 75 mcg by mouth daily before breakfast.     . linaclotide (LINZESS) 290 MCG CAPS capsule Take 1 capsule (290 mcg total) by mouth daily before breakfast. 30 capsule 5  . modafinil (PROVIGIL) 200 MG tablet Take 200 mg by mouth daily.     . montelukast (SINGULAIR) 10 MG tablet TAKE 1 TABLET BY MOUTH ONCE DAILY    . ondansetron (ZOFRAN) 4 MG tablet Take 1 tablet (4 mg total) by mouth every 6 (six) hours as needed for nausea or vomiting. 20 tablet 0  . pantoprazole (PROTONIX) 40 MG tablet Take 1 tablet (40 mg total) by mouth 2 (two) times daily with a meal. 180 tablet 3  . polyethylene glycol (MIRALAX / GLYCOLAX) 17 g packet Take 17 g by mouth 2 (two) times daily. (Patient taking differently: Take 17 g by mouth daily as needed. ) 60 each 1  . pramipexole (MIRAPEX) 1 MG tablet Take 1 tablet (1 mg total) by mouth 2 (two) times daily. 60 tablet 5  . Pseudoephedrine-Guaifenesin (MUCINEX D MAX STRENGTH) 2178417679 MG TB12 Take by mouth as needed.     Marland Kitchen QUEtiapine (SEROQUEL XR) 400 MG 24 hr tablet Take 2 tablets (800 mg total) by mouth at bedtime. 60 tablet 5  . trimethoprim (TRIMPEX) 100 MG tablet Take 100 mg by mouth daily.    . zaleplon (SONATA) 10 MG capsule Take 1 qhs prn and may repeat 1 for midnocturnal awakening prn if she has 3 hours left to sleep 60 capsule 1   No current facility-administered medications for this  visit.    REVIEW OF SYSTEMS:   Constitutional: Denies fevers, chills or abnormal night sweats Respiratory: Denies cough, dyspnea or wheezes Cardiovascular: Denies palpitation, chest discomfort or  lower extremity swelling Gastrointestinal:  Denies nausea, heartburn or change in bowel habits Skin: Denies abnormal skin rashes Lymphatics: Denies new lymphadenopathy or easy bruising Neurological:Denies numbness, tingling or new weaknesses Behavioral/Psych: Mood is stable, no new changes  All other systems were reviewed with the patient and are negative.  PHYSICAL EXAMINATION: ECOG PERFORMANCE STATUS: 0 - Asymptomatic  Vitals:   06/10/19 1432  BP: 122/62  Pulse: 97  Resp: 18  Temp: (!) 97.3 F (36.3 C)  SpO2: 97%   Filed Weights   06/10/19 1432  Weight: 245 lb 14.4 oz (111.5 kg)    GENERAL:alert, no distress and comfortable SKIN: skin color, texture, turgor are normal, no rashes or significant lesions NECK: supple, thyroid normal size, non-tender, without nodularity LYMPH:  no palpable lymphadenopathy in the cervical, axillary or inguinal LUNGS: clear to auscultation and percussion with normal breathing effort HEART: regular rate & rhythm and no murmurs and no lower extremity edema ABDOMEN:abdomen soft, non-tender and normal bowel sounds Musculoskeletal:no cyanosis of digits and no clubbing  PSYCH: alert & oriented x 3 with fluent speech NEURO: no focal motor/sensory deficits  LABORATORY DATA:  I have reviewed the data as listed Recent Results (from the past 2160 hour(s))  CBC with Differential/Platelet     Status: None (Preliminary result)   Collection Time: 06/10/19  3:39 PM  Result Value Ref Range   WBC 10.2 4.0 - 10.5 K/uL   RBC 4.15 3.87 - 5.11 MIL/uL   Hemoglobin 12.3 12.0 - 15.0 g/dL   HCT 38.1 36.0 - 46.0 %   MCV 91.8 80.0 - 100.0 fL   MCH 29.6 26.0 - 34.0 pg   MCHC 32.3 30.0 - 36.0 g/dL   RDW 14.2 11.5 - 15.5 %   Platelets 361 150 - 400 K/uL   nRBC 0.0 0.0  - 0.2 %    Comment: Performed at [facility], [address], [place], [place] [address]   Neutrophils Relative % PENDING %   Neutro Abs PENDING 1.7 - 7.7 K/uL   Band Neutrophils PENDING %   Lymphocytes Relative PENDING %   Lymphs Abs PENDING 0.7 - 4.0 K/uL   Monocytes Relative PENDING %   Monocytes Absolute PENDING 0.1 - 1.0 K/uL   Eosinophils Relative PENDING %   Eosinophils Absolute PENDING 0.0 - 0.5 K/uL   Basophils Relative PENDING %   Basophils Absolute PENDING 0.0 - 0.1 K/uL   WBC Morphology PENDING    RBC Morphology PENDING    Smear Review PENDING    Other PENDING %   nRBC PENDING 0 /100 WBC   Metamyelocytes Relative PENDING %   Myelocytes PENDING %   Promyelocytes Relative PENDING %   Blasts PENDING %   Immature Granulocytes PENDING %   Abs Immature Granulocytes PENDING 0.00 - 0.07 K/uL  Lactate dehydrogenase     Status: None   Collection Time: 06/10/19  3:39 PM  Result Value Ref Range   LDH 153 98 - 192 U/L    Comment: Performed at [facility], [address] [place], [place] [address]    RADIOGRAPHIC STUDIES: I have personally reviewed the radiological images as listed and agreed with the findings in the report. US Abdomen Complete  Result Date: 05/27/2019 CLINICAL DATA:  Abnormal LFTs, abdominal distension EXAM: ABDOMEN ULTRASOUND COMPLETE COMPARISON:  CT abdomen 08/05/2011 FINDINGS: Gallbladder: Normally distended without stones or wall thickening. No pericholecystic fluid or sonographic Murphy sign. Common bile duct: Diameter: 4 mm, normal Liver: Echogenic parenchyma, likely fatty infiltration though  this can be seen with cirrhosis and certain infiltrative disorders. No focal hepatic mass or nodularity identified. Portal vein is patent on color Doppler imaging with normal direction of blood flow towards the liver. IVC: Normal appearance Pancreas: Normal appearance Spleen: Appears mildly prominent size, 12.1 cm length with calculated volume 502 mL. No focal mass.  Right Kidney: Length: 13.5 cm. Normal morphology without mass or hydronephrosis. Left Kidney: Length: 12.8 cm. Normal morphology without mass or hydronephrosis. Abdominal aorta: Normal caliber Other findings: No free fluid IMPRESSION: Probable fatty infiltration of liver. Minimal splenic enlargement. Otherwise negative exam. Electronically Signed   By: Lavonia Dana M.D.   On: 05/27/2019 11:57   I have independently interviewed this patient and examined her.  I agree with HPI dictated by my nurse practitioner Wenda Low, FNP.  I have independently formulated my assessment and plan. ASSESSMENT & PLAN:  Leukocytosis 1.  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  Differential showed predominantly neutrophilic leukocytosis and monocytosis. -She also had intermittent urinary tract infections. -She reported that she was placed on trimethoprim since December 2020 and has been doing well since then. -Had 1 or 2 episodes of thrombocytosis.  No prior history of autoimmune disorders.  She quit smoking in 2003. -Ultrasound of the abdomen on 05/27/2019 shows probable fatty infiltration of the liver with minimal splenic enlargement, measuring 12.1 cm in length with a calculated volume of 502 mL. -She does not have any B symptoms including fevers, night sweats or weight loss. -We will repeat her CBC today.  We will check for myeloproliferative disorders with JAK2 V617F and BCR/ABL mutation testing. -I will see her back in 2 to 3 weeks to discuss results.  2.  Family history: -Father had myeloma and paternal grandfather also had myeloma.  Mother had non-Hodgkin's lymphoma.  Maternal uncle also had non-Hodgkin's lymphoma.  Paternal aunt had colon cancer. -We will check for LDH and SPEP.   All questions were answered. The patient knows to call the clinic with any problems, questions or concerns.      Derek Jack, MD 06/10/19 4:22  PM

## 2019-06-10 NOTE — Patient Instructions (Signed)
[facility] at [facility] Discharge Instructions  Follow up in 2-3 weeks   Thank you for choosing  [facility] at [facility] to provide your oncology and hematology care.  To afford each patient quality time with our provider, please arrive at least 15 minutes before your scheduled appointment time.   If you have a lab appointment with the Cancer Center please come in thru the Main Entrance and check in at the main information desk.  You need to re-schedule your appointment should you arrive 10 or more minutes late.  We strive to give you quality time with our providers, and arriving late affects you and other patients whose appointments are after yours.  Also, if you no show three or more times for appointments you may be dismissed from the clinic at the providers discretion.     Again, thank you for choosing [facility].  Our hope is that these requests will decrease the amount of time that you wait before being seen by our physicians.       _____________________________________________________________  Should you have questions after your visit to [facility], please contact our office at [phone] between the hours of 8:00 a.m. and 4:30 p.m.  Voicemails left after 4:00 p.m. will not be returned until the following business day.  For prescription refill requests, have your pharmacy contact our office and allow 72 hours.    Due to Covid, you will need to wear a mask upon entering the hospital. If you do not have a mask, a mask will be given to you at the Main Entrance upon arrival. For doctor visits, patients may have 1 support person with them. For treatment visits, patients can not have anyone with them due to social distancing guidelines and our immunocompromised population.      

## 2019-06-11 ENCOUNTER — Encounter (HOSPITAL_COMMUNITY): Payer: Self-pay | Admitting: Nurse Practitioner

## 2019-06-11 ENCOUNTER — Other Ambulatory Visit: Payer: Self-pay | Admitting: Gastroenterology

## 2019-06-11 LAB — PROTEIN ELECTROPHORESIS, SERUM
A/G Ratio: 1.2 (ref 0.7–1.7)
Albumin ELP: 3.7 g/dL (ref 2.9–4.4)
Alpha-1-Globulin: 0.2 g/dL (ref 0.0–0.4)
Alpha-2-Globulin: 0.9 g/dL (ref 0.4–1.0)
Beta Globulin: 1.1 g/dL (ref 0.7–1.3)
Gamma Globulin: 0.8 g/dL (ref 0.4–1.8)
Globulin, Total: 3 g/dL (ref 2.2–3.9)
Total Protein ELP: 6.7 g/dL (ref 6.0–8.5)

## 2019-06-11 LAB — PTH, INTACT AND CALCIUM
Calcium, Total (PTH): 9.6 mg/dL (ref 8.7–10.2)
PTH: 30 pg/mL (ref 15–65)

## 2019-06-13 ENCOUNTER — Encounter (HOSPITAL_COMMUNITY): Payer: Self-pay | Admitting: Nurse Practitioner

## 2019-06-14 LAB — METHYLMALONIC ACID, SERUM: Methylmalonic Acid, Quantitative: 140 nmol/L (ref 0–378)

## 2019-06-21 ENCOUNTER — Ambulatory Visit (INDEPENDENT_AMBULATORY_CARE_PROVIDER_SITE_OTHER): Payer: 59 | Admitting: Family Medicine

## 2019-06-22 LAB — CALR + JAK2 E12-15 + MPL (REFLEXED)

## 2019-06-22 LAB — JAK2 V617F, W REFLEX TO CALR/E12/MPL

## 2019-06-24 LAB — BCR-ABL1 FISH
Cells Analyzed: 200
Cells Counted: 200

## 2019-06-28 ENCOUNTER — Telehealth: Payer: Self-pay | Admitting: Physician Assistant

## 2019-06-28 NOTE — Telephone Encounter (Signed)
Given instructions on temporary increase of Xanax if she needs it. Her last refill was 06/10/2019 #90. She does have plenty for right now. Advised her to call back when she's running low.  She did recently get a new counselor who she really likes. Given our condolences and to call back if she needs anything else.

## 2019-06-28 NOTE — Telephone Encounter (Signed)
Patient called and said that her mom is dying and that she needs something stronger than xanax to help her get through this time. Please send to the [facility] in [place]. Please call the pt at [phone]

## 2019-06-28 NOTE — Telephone Encounter (Signed)
Xanax is very strong already.  She takes 1 mg 3 times daily already.  She can increase the dose up to Xanax 1 mg, 1 every 6 hours as needed.  Take as little as possible though because she will become dependent on it and I do not want her to stay at a total of 4 mg a day if we can help it.  I know she will run out of her medication earlier than normal.  We can okay an early refill when it is due.  Please remind her to get into counseling if she is not already.Please give her my condolences.

## 2019-06-29 ENCOUNTER — Inpatient Hospital Stay (HOSPITAL_COMMUNITY): Payer: 59 | Admitting: Hematology

## 2019-06-29 ENCOUNTER — Other Ambulatory Visit: Payer: Self-pay

## 2019-06-29 ENCOUNTER — Encounter (HOSPITAL_COMMUNITY): Payer: Self-pay | Admitting: Hematology

## 2019-06-29 ENCOUNTER — Inpatient Hospital Stay (HOSPITAL_COMMUNITY): Payer: 59 | Attending: Hematology | Admitting: Hematology

## 2019-06-29 ENCOUNTER — Ambulatory Visit (HOSPITAL_COMMUNITY): Payer: 59 | Admitting: Hematology

## 2019-06-29 VITALS — BP 140/86 | HR 113 | Temp 98.9°F | Resp 16 | Wt 246.6 lb

## 2019-06-29 DIAGNOSIS — M1990 Unspecified osteoarthritis, unspecified site: Secondary | ICD-10-CM | POA: Insufficient documentation

## 2019-06-29 DIAGNOSIS — D72825 Bandemia: Secondary | ICD-10-CM

## 2019-06-29 DIAGNOSIS — K219 Gastro-esophageal reflux disease without esophagitis: Secondary | ICD-10-CM | POA: Insufficient documentation

## 2019-06-29 DIAGNOSIS — D72829 Elevated white blood cell count, unspecified: Secondary | ICD-10-CM | POA: Diagnosis not present

## 2019-06-29 DIAGNOSIS — Z836 Family history of other diseases of the respiratory system: Secondary | ICD-10-CM | POA: Insufficient documentation

## 2019-06-29 DIAGNOSIS — Z87442 Personal history of urinary calculi: Secondary | ICD-10-CM | POA: Insufficient documentation

## 2019-06-29 DIAGNOSIS — Z8261 Family history of arthritis: Secondary | ICD-10-CM | POA: Diagnosis not present

## 2019-06-29 DIAGNOSIS — Z8719 Personal history of other diseases of the digestive system: Secondary | ICD-10-CM | POA: Diagnosis not present

## 2019-06-29 DIAGNOSIS — Z87440 Personal history of urinary (tract) infections: Secondary | ICD-10-CM | POA: Diagnosis not present

## 2019-06-29 DIAGNOSIS — Z833 Family history of diabetes mellitus: Secondary | ICD-10-CM | POA: Diagnosis not present

## 2019-06-29 DIAGNOSIS — Z87891 Personal history of nicotine dependence: Secondary | ICD-10-CM | POA: Diagnosis not present

## 2019-06-29 DIAGNOSIS — Z8249 Family history of ischemic heart disease and other diseases of the circulatory system: Secondary | ICD-10-CM | POA: Diagnosis not present

## 2019-06-29 DIAGNOSIS — Z818 Family history of other mental and behavioral disorders: Secondary | ICD-10-CM | POA: Insufficient documentation

## 2019-06-29 DIAGNOSIS — Z79899 Other long term (current) drug therapy: Secondary | ICD-10-CM | POA: Insufficient documentation

## 2019-06-29 DIAGNOSIS — Z800 Family history of malignant neoplasm of digestive organs: Secondary | ICD-10-CM | POA: Insufficient documentation

## 2019-06-29 DIAGNOSIS — Z808 Family history of malignant neoplasm of other organs or systems: Secondary | ICD-10-CM | POA: Insufficient documentation

## 2019-06-29 DIAGNOSIS — Z86010 Personal history of colonic polyps: Secondary | ICD-10-CM | POA: Diagnosis not present

## 2019-06-29 NOTE — Patient Instructions (Signed)
[facility] at [facility] Discharge Instructions  You were seen today by Dr. Katragadda. He went over your recent results. He will see you back in 6 months for labs and follow up.   Thank you for choosing [facility] at [facility] to provide your oncology and hematology care.  To afford each patient quality time with our provider, please arrive at least 15 minutes before your scheduled appointment time.   If you have a lab appointment with the Cancer Center please come in thru the  Main Entrance and check in at the main information desk  You need to re-schedule your appointment should you arrive 10 or more minutes late.  We strive to give you quality time with our providers, and arriving late affects you and other patients whose appointments are after yours.  Also, if you no show three or more times for appointments you may be dismissed from the clinic at the providers discretion.     Again, thank you for choosing [facility].  Our hope is that these requests will decrease the amount of time that you wait before being seen by our physicians.       _____________________________________________________________  Should you have questions after your visit to  Cancer Center, please contact our office at [phone] between the hours of 8:00 a.m. and 4:30 p.m.  Voicemails left after 4:00 p.m. will not be returned until the following business day.  For prescription refill requests, have your pharmacy contact our office and allow 72 hours.    Cancer Center Support Programs:   > Cancer Support Group  2nd Tuesday of the month 1pm-2pm, Journey Room    

## 2019-06-29 NOTE — Assessment & Plan Note (Addendum)
1.  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 -She had intermittent UTIs for which she was placed on trimethoprim since December 2020. -No prior history of autoimmune disorders.  She quit smoking in 2003. -Ultrasound of the abdomen on 05/27/2019 showed probable fatty infiltration of the liver with minimal splenic enlargement measuring 12.1 cm in length and was calculated volume of 502 mL. -Does not have any B symptoms including fevers, night sweats or weight loss. -Blood work on 06/10/2019 was reviewed with the patient.  White count normalized at 10.2.  Normal hemoglobin and platelet count with normal differential.  Myeloproliferative disorder work-up including JAK2 V617F mutation and BCR/ABL was negative.  LDH was normal.  SPEP is negative. -I have recommended follow-up in 6 months with repeat CBC and LDH.  If the CBC is remaining stable, we will discharge her from the clinic and we will see her on an as-needed basis.  2.  Family history: -Father had myeloma and paternal grandfather also had myeloma.  Mother had non-Hodgkin's lymphoma.  Maternal uncle had non-Hodgkin's lymphoma.  Paternal aunt had colon cancer.

## 2019-06-29 NOTE — Progress Notes (Signed)
Kristen [name], Kristen [name] [id number]   CLINIC:  Medical Oncology/Hematology  PCP:  Manon Hilding, MD [address] [place] [address]  [phone]  REASON FOR VISIT:  Follow-up for leukocytosis  CURRENT THERAPY: Observation.  INTERVAL HISTORY:  Kristen [name] 49 y.o. female returns for routine follow-up for leukocytosis. Kristen [name] was last seen on 06/10/2019.  She is continuing trimethoprim for recurrent UTIs.  Denies any B symptoms.  Appetite is 100%.  Energy levels are 50%.  She is upset as her mother is currently in the hospital.   The workup revealed: hematocrit 38.1, hemoglobin 12.3, MCV 91.8, Platelets 474,000, WBC 16.6, and ANC 6,900. No M-Spike observed. LDH was 153.  BCR-ABL1 FISH testing revealed no BCR or ABL1 gene rearrangement observed. Negative for the JAK2 V617F mutation.  Negative for JAK2 mutations in exons 12, 13, 14, & 15.  CALR Mutation negative. MPL mutation negative  REVIEW OF SYSTEMS:  Review of Systems  Constitutional: Negative for appetite change, chills, diaphoresis, fatigue and fever.  HENT:   Negative for mouth sores, sore throat and trouble swallowing.   Eyes: Negative for eye problems.  Respiratory: Negative for cough, shortness of breath and wheezing.   Cardiovascular: Negative for chest pain, leg swelling and palpitations.  Gastrointestinal: Negative for abdominal pain, constipation, diarrhea, nausea and vomiting.  Genitourinary: Negative for bladder incontinence, dysuria and frequency.   Musculoskeletal: Negative for arthralgias, back pain and myalgias.  Skin: Negative for rash.  Neurological: Negative for dizziness, extremity weakness, headaches and numbness.  Hematological: Does not bruise/bleed easily.  Psychiatric/Behavioral: Negative for depression and sleep disturbance. The patient is nervous/anxious (due to mother).     PAST MEDICAL/SURGICAL HISTORY:  Past Medical History:  Diagnosis Date  . Arthritis     . Bipolar 1 disorder ([place])   . Chronic constipation   . Depression   . Family history of adverse reaction to anesthesia    mother-- ponv  . GAD (generalized anxiety disorder)   . GERD (gastroesophageal reflux disease)   . Hiatal hernia   . History of kidney stones   . History of recurrent UTIs   . History of sepsis 06/2018  . History of suicidal ideation   . Hyperlipidemia   . Hypothyroidism   . IDA (iron deficiency anemia)   . PONV (postoperative nausea and vomiting)   . Renal calculus, left   . Seasonal allergies   . Sleep disorder, unspecified    per excessive sleeping during the day  . Wears contact lenses    Past Surgical History:  Procedure Laterality Date  . ANTERIOR CERVICAL DECOMP/DISCECTOMY FUSION  02/17/2012   Procedure: ANTERIOR CERVICAL DECOMPRESSION/DISCECTOMY FUSION 2 LEVELS;  Surgeon: Hosie Spangle, MD;  Location: [facility];  Service: Neurosurgery;  Laterality: N/A;  Cervical four-five and Cervical six-seven anterior cervial decompression with fusion plating and bonegraft  . ANTERIOR CERVICAL DECOMP/DISCECTOMY FUSION  04-01-2001   '@[facility]'    C5---6  . BIOPSY  07/20/2018   Procedure: BIOPSY;  Surgeon: Daneil Dolin, MD;  Location: [facility];  Service: Endoscopy;;  gastric   . BREAST REDUCTION SURGERY Bilateral 1995  . CESAREAN SECTION  x2   last one 1998   with BILATERAL TUBAL LIGATION  . CESAREAN SECTION WITH BILATERAL TUBAL LIGATION    . COLONOSCOPY WITH PROPOFOL N/A 07/20/2018   Dr. Gala Romney: Diverticulosis, internal hemorrhoids, 5 mm splenic flexure tubular adenoma removed.  . CYSTOSCOPY/URETEROSCOPY/HOLMIUM LASER/STENT PLACEMENT Left 08/21/2018  Procedure: CYSTOSCOPY LEFT RETROGRADE PYELOGRAM /URETEROSCOPY/HOLMIUM LASER/STENT PLACEMENT;  Surgeon: Ceasar Mons, MD;  Location: [facility];  Service: Urology;  Laterality: Left;  . DILATION AND CURETTAGE OF UTERUS  1992   for miscarriage  . ENDOMETRIAL ABLATION  2014  .  ESOPHAGOGASTRODUODENOSCOPY     20 years ago.   Marland Kitchen ESOPHAGOGASTRODUODENOSCOPY (EGD) WITH PROPOFOL N/A 07/20/2018   Dr. Gala Romney: Small hiatal hernia, erythematous mucosa in the stomach with a healing gastric ulcer measuring 1 cm, biopsies negative.  Marland Kitchen [place]   unilateral for infertility  . NASAL SINUS SURGERY  2010  approx.  Marland Kitchen POLYPECTOMY  07/20/2018   Procedure: POLYPECTOMY;  Surgeon: Daneil Dolin, MD;  Location: [facility];  Service: Endoscopy;;  . POSTERIOR CERVICAL FUSION/FORAMINOTOMY N/A 08/18/2013   Procedure: CERVICAL FOUR TO CERVICAL SEVEN POSTERIOR CERVICAL FUSION/FORAMINOTOMY LEVEL 3;  Surgeon: Hosie Spangle, MD;  Location: [facility];  Service: Neurosurgery;  Laterality: N/A;  C4-7 posterior cervical fusion with lateral mass fixation    SOCIAL HISTORY:  Social History   Socioeconomic History  . Marital status: Married    Spouse name: Not on file  . Number of children: 2  . Years of education: Not on file  . Highest education level: Not on file  Occupational History  . Occupation: disability  Tobacco Use  . Smoking status: Former Smoker    Packs/day: 1.00    Years: 5.00    Pack years: 5.00    Types: Cigarettes    Quit date: 02/09/2002    Years since quitting: 17.3  . Smokeless tobacco: Never Used  Substance and Sexual Activity  . Alcohol use: No  . Drug use: No  . Sexual activity: Yes    Birth control/protection: Surgical  Other Topics Concern  . Not on file  Social History Narrative   Step brother-2   Step sister-2   Half sister -1 healthy   Social Determinants of Health   Financial Resource Strain: Low Risk   . Difficulty of Paying Living Expenses: Not very hard  Food Insecurity: No Food Insecurity  . Worried About Charity fundraiser in the Last Year: Never true  . Ran Out of Food in the Last Year: Never true  Transportation Needs: No Transportation Needs  . Lack of Transportation (Medical): No  . Lack of Transportation (Non-Medical):  No  Physical Activity: Unknown  . Days of Exercise per Week: Patient refused  . Minutes of Exercise per Session: Patient refused  Stress: Stress Concern Present  . Feeling of Stress : Rather much  Social Connections: Unknown  . Frequency of Communication with Friends and Family: Patient refused  . Frequency of Social Gatherings with Friends and Family: Patient refused  . Attends Religious Services: Patient refused  . Active Member of Clubs or Organizations: Patient refused  . Attends Archivist Meetings: Patient refused  . Marital Status: Patient refused  Intimate Partner Violence: Not At Risk  . Fear of Current or Ex-Partner: No  . Emotionally Abused: No  . Physically Abused: No  . Sexually Abused: No    FAMILY HISTORY:  Family History  Problem Relation Age of Onset  . Asthma Mother   . Rheum arthritis Mother   . Non-Hodgkin's lymphoma Mother   . Congestive Heart Failure Mother   . Atrial fibrillation Mother   . Diabetes Mother   . Colon cancer Father   . Multiple myeloma Father   . Allergies Other  entire family(mom and dad)  . Diabetes Maternal Grandmother   . Congestive Heart Failure Maternal Grandmother   . Mental illness Paternal Grandmother   . Colon cancer Paternal Grandfather   . Bone cancer Paternal Grandfather     CURRENT MEDICATIONS:  Current Outpatient Medications  Medication Sig Dispense Refill  . acetaminophen (TYLENOL) 325 MG tablet Take 2 tablets (650 mg total) by mouth every 6 (six) hours as needed for mild pain (or Fever >/= 101). 12 tablet 1  . ALPRAZolam (XANAX) 1 MG tablet Take 1 tablet (1 mg total) by mouth 3 (three) times daily as needed. for anxiety 90 tablet 5  . Azelastine HCl 137 MCG/SPRAY SOLN SMARTSIG:1-2 Spray(s) Both Nares Twice Daily PRN    . buPROPion (WELLBUTRIN XL) 150 MG 24 hr tablet TAKE 3 TABLETS BY MOUTH IN THE MORNING 90 tablet 5  . cetirizine (ZYRTEC) 10 MG tablet Take 10 mg by mouth daily.     . Cholecalciferol  (VITAMIN D) 50 MCG (2000 UT) CAPS Take by mouth daily.     Marland Kitchen docusate sodium (STOOL SOFTENER) 100 MG capsule Take 100 mg by mouth daily.    . fluticasone (FLONASE) 50 MCG/ACT nasal spray Place 2 sprays into both nostrils daily as needed for allergies or rhinitis.    Marland Kitchen lamoTRIgine (LAMICTAL) 200 MG tablet Take 2.5 tablets (500 mg total) by mouth daily. 75 tablet 5  . levocetirizine (XYZAL) 5 MG tablet Take 5 mg by mouth every evening.    Marland Kitchen levofloxacin (LEVAQUIN) 500 MG tablet Take 500 mg by mouth daily.    Marland Kitchen levothyroxine (SYNTHROID) 75 MCG tablet Take 75 mcg by mouth daily before breakfast.     . linaclotide (LINZESS) 290 MCG CAPS capsule Take 1 capsule (290 mcg total) by mouth daily before breakfast. 30 capsule 5  . modafinil (PROVIGIL) 200 MG tablet Take 200 mg by mouth daily.     . montelukast (SINGULAIR) 10 MG tablet TAKE 1 TABLET BY MOUTH ONCE DAILY    . ondansetron (ZOFRAN) 4 MG tablet TAKE 1 TABLET BY MOUTH EVERY 6 HOURS AS NEEDED FOR NAUSEA OR VOMITING 20 tablet 0  . pantoprazole (PROTONIX) 40 MG tablet Take 1 tablet (40 mg total) by mouth 2 (two) times daily with a meal. 180 tablet 3  . polyethylene glycol (MIRALAX / GLYCOLAX) 17 g packet Take 17 g by mouth 2 (two) times daily. (Patient taking differently: Take 17 g by mouth daily as needed. ) 60 each 1  . pramipexole (MIRAPEX) 1 MG tablet Take 1 tablet (1 mg total) by mouth 2 (two) times daily. 60 tablet 5  . Pseudoephedrine-Guaifenesin (MUCINEX D MAX STRENGTH) [phone] MG TB12 Take by mouth as needed.     Marland Kitchen QUEtiapine (SEROQUEL XR) 400 MG 24 hr tablet Take 2 tablets (800 mg total) by mouth at bedtime. 60 tablet 5  . trimethoprim (TRIMPEX) 100 MG tablet Take 100 mg by mouth daily.    . zaleplon (SONATA) 10 MG capsule Take 1 qhs prn and may repeat 1 for midnocturnal awakening prn if she has 3 hours left to sleep 60 capsule 1   No current facility-administered medications for this visit.    ALLERGIES:  Allergies  Allergen Reactions    . Iohexol Hives     Desc: IVP DYE   Desc: IVP DYE  Desc: IVP DYE  Desc: IVP DYE  Desc: IVP DYE  Desc: IVP DYE     PHYSICAL EXAM:  Performance status (ECOG): 0 - Asymptomatic  Vitals:   06/29/19 1144  BP: 140/86  Pulse: (!) 113  Resp: 16  Temp: 98.9 F (37.2 C)  SpO2: 98%   Wt Readings from Last 3 Encounters:  06/29/19 246 lb 9.6 oz (111.9 kg)  06/10/19 245 lb 14.4 oz (111.5 kg)  04/19/19 251 lb 12.8 oz (114.2 kg)   Physical Exam  Constitutional: She is oriented to person, place, and time. She appears well-developed and well-nourished. No distress. Face mask in place.  HENT:  Head: Normocephalic and atraumatic.  Right Ear: Hearing normal.  Left Ear: Hearing normal.  Mouth/Throat: Oropharynx is clear and moist and mucous membranes are normal. No oral lesions.  Eyes: Pupils are equal, round, and reactive to light. Conjunctivae and EOM are normal.  Cardiovascular: Normal rate, regular rhythm and normal heart sounds. Exam reveals no gallop and no friction rub.  No murmur heard. Pulmonary/Chest: Effort normal and breath sounds normal. She has no wheezes. She has no rhonchi. She has no rales.  Abdominal: Soft. Normal appearance and bowel sounds are normal. She exhibits no mass. There is no hepatosplenomegaly. There is no abdominal tenderness. There is no CVA tenderness.  Musculoskeletal:        General: No tenderness or edema. Normal range of motion.     Cervical back: Normal range of motion and neck supple.  Lymphadenopathy:    She has no cervical adenopathy.       Right cervical: No superficial cervical adenopathy present.   She has no axillary adenopathy.       Right: No inguinal adenopathy present.       Left: No inguinal adenopathy present.  Neurological: She is alert and oriented to person, place, and time.  Skin: Skin is warm, dry and intact. No bruising, no lesion and no rash noted. No erythema.  Psychiatric: She has a normal mood and affect. Her behavior is  normal. Judgment and thought content normal.     LABORATORY DATA:  I have reviewed the labs as listed.  CBC Latest Ref Rng & Units 06/10/2019 07/12/2018 07/11/2018  WBC 4.0 - 10.5 K/uL 10.2 16.6(H) 20.2(H)  Hemoglobin 12.0 - 15.0 g/dL 12.3 8.4(L) 7.9(L)  Hematocrit 36.0 - 46.0 % 38.1 28.0(L) 25.5(L)  Platelets 150 - 400 K/uL 361 474(H) 348   CMP Latest Ref Rng & Units 06/10/2019 07/12/2018 07/11/2018  Glucose 70 - 99 mg/dL - 112(H) 109(H)  BUN 6 - 20 mg/dL - 9 13  Creatinine 0.44 - 1.00 mg/dL - 1.10(H) 1.43(H)  Sodium 135 - 145 mmol/L - 147(H) 141  Potassium 3.5 - 5.1 mmol/L - 3.2(L) 3.8  Chloride 98 - 111 mmol/L - 116(H) 113(H)  CO2 22 - 32 mmol/L - 17(L) 19(L)  Calcium 8.7 - 10.2 mg/dL 9.6 9.4 8.7(L)  Total Protein 6.5 - 8.1 g/dL - - 6.1(L)  Total Bilirubin 0.3 - 1.2 mg/dL - - 0.5  Alkaline Phos 38 - 126 U/L - - 176(H)  AST 15 - 41 U/L - - 18  ALT 0 - 44 U/L - - 19       Component Value Date/Time   RBC 4.15 06/10/2019 1539   MCV 91.8 06/10/2019 1539   MCH 29.6 06/10/2019 1539   MCHC 32.3 06/10/2019 1539   RDW 14.2 06/10/2019 1539   LYMPHSABS 2.0 06/10/2019 1539   MONOABS 0.8 06/10/2019 1539   EOSABS 0.4 06/10/2019 1539   BASOSABS 0.1 06/10/2019 1539    DIAGNOSTIC IMAGING:  I have reviewed scans.   ASSESSMENT & PLAN:  Leukocytosis  1.  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 -She had intermittent UTIs for which she was placed on trimethoprim since December 2020. -No prior history of autoimmune disorders.  She quit smoking in 2003. -Ultrasound of the abdomen on 05/27/2019 showed probable fatty infiltration of the liver with minimal splenic enlargement measuring 12.1 cm in length and was calculated volume of 502 mL. -Does not have any B symptoms including fevers, night sweats or weight loss. -Blood work on 06/10/2019 was reviewed with the patient.  White count normalized at 10.2.   Normal hemoglobin and platelet count with normal differential.  Myeloproliferative disorder work-up including JAK2 V617F mutation and BCR/ABL was negative.  LDH was normal.  SPEP is negative. -I have recommended follow-up in 6 months with repeat CBC and LDH.  If the CBC is remaining stable, we will discharge her from the clinic and we will see her on an as-needed basis.  2.  Family history: -Father had myeloma and paternal grandfather also had myeloma.  Mother had non-Hodgkin's lymphoma.  Maternal uncle had non-Hodgkin's lymphoma.  Paternal aunt had colon cancer.    Orders placed this encounter:  Orders Placed This Encounter  Procedures  . CBC with Differential  . Lactate dehydrogenase    Derek Jack, MD, 06/29/19 12:44 PM  [place] [phone]   I, Jacqualyn Posey, am acting as a scribe for Dr. Sanda Linger.  I, Derek Jack MD, have reviewed the above documentation for accuracy and completeness, and I agree with the above.

## 2019-07-05 ENCOUNTER — Ambulatory Visit (INDEPENDENT_AMBULATORY_CARE_PROVIDER_SITE_OTHER): Payer: 59 | Admitting: Family Medicine

## 2019-07-06 ENCOUNTER — Encounter: Payer: Self-pay | Admitting: Physician Assistant

## 2019-07-07 ENCOUNTER — Telehealth: Payer: Self-pay | Admitting: Physician Assistant

## 2019-07-07 ENCOUNTER — Telehealth: Payer: Medicare Other | Admitting: Physician Assistant

## 2019-07-07 NOTE — Telephone Encounter (Signed)
Kristen [name] just called to let you know that her mother passed away May 30, 2022.  She is doing pretty good but will definitely set up an appointment when everything settles down.  Just wanted you to know since you two have been talking about it.

## 2019-07-08 NOTE — Telephone Encounter (Signed)
Oh, I'm sorry to hear this news.

## 2019-07-22 ENCOUNTER — Telehealth: Payer: Self-pay | Admitting: Physician Assistant

## 2019-07-22 NOTE — Telephone Encounter (Signed)
Patient aware and will take 2.5 tablets Seroquel XR 400 mg starting tonight and over the weekend and will discuss things on Tuesday 06/15 on how's she doing. I did give her our condolences and reassured her that her emotions are probably all over the place while going through this difficult time. She was appreciative of call.

## 2019-07-22 NOTE — Telephone Encounter (Signed)
Pt mother passed away 5/24 pt has been so irritable, and so depressed. Pt has not been able to eat or sleep. Her main concern is how irritable she is acting towards everyone. Pt wants to know if something can be called in before her appt.

## 2019-07-22 NOTE — Telephone Encounter (Signed)
Have her increase Seroquel XR to a total of 1,000 mg.  So 2 1/2 pills.   If unable to cut, then I'll send in Rx for 200 mg.

## 2019-07-27 ENCOUNTER — Encounter: Payer: Self-pay | Admitting: Physician Assistant

## 2019-07-27 ENCOUNTER — Telehealth (INDEPENDENT_AMBULATORY_CARE_PROVIDER_SITE_OTHER): Payer: 59 | Admitting: Physician Assistant

## 2019-07-27 DIAGNOSIS — Z634 Disappearance and death of family member: Secondary | ICD-10-CM | POA: Diagnosis not present

## 2019-07-27 DIAGNOSIS — F319 Bipolar disorder, unspecified: Secondary | ICD-10-CM | POA: Diagnosis not present

## 2019-07-27 DIAGNOSIS — E032 Hypothyroidism due to medicaments and other exogenous substances: Secondary | ICD-10-CM

## 2019-07-27 DIAGNOSIS — F411 Generalized anxiety disorder: Secondary | ICD-10-CM

## 2019-07-27 DIAGNOSIS — R454 Irritability and anger: Secondary | ICD-10-CM

## 2019-07-27 MED ORDER — ZALEPLON 10 MG PO CAPS: ORAL_CAPSULE | ORAL | 5 refills | Status: DC

## 2019-07-27 MED ORDER — QUETIAPINE FUMARATE ER 400 MG PO TB24: 1000.0000 mg | ORAL_TABLET | Freq: Every day | ORAL | 5 refills | Status: DC

## 2019-07-27 NOTE — Progress Notes (Signed)
[facility]  Patient ID: Kristen [name],  MRN: [id number]  PCP: Manon Hilding, MD  Date of Evaluation: 07/27/2019 Time spent:30 minutes  Chief Complaint:  Chief Complaint    Follow-up     Virtual Visit via Telephone or Video Note  I connected with patient by a video enabled telemedicine application or telephone, with their informed consent, and verified patient privacy and that I am speaking with the correct person using two identifiers.  I am private, in my office and the patient is home.   I discussed the limitations, risks, security and privacy concerns of performing an evaluation and management service by video and the availability of in person appointments. I also discussed with the patient that there may be a patient responsible charge related to this service. The patient expressed understanding and agreed to proceed.   I discussed the assessment and treatment plan with the patient. The patient was provided an opportunity to ask questions and all were answered. The patient agreed with the plan and demonstrated an understanding of the instructions.   The patient was advised to call back or seek an in-person evaluation if the symptoms worsen or if the condition fails to improve as anticipated.  I provided 30 minutes of non-face-to-face time during this encounter.  HISTORY/CURRENT STATUS: HPI not doing well.  Having a hard time. Her Mom died last month. And they're 99% sure that her husband has lymphoma.  He will have another biopsy soon.  The recent one was inconclusive but he is PET scan "lights up all over the place."  Between that worry and the extreme sadness she feels from her mom's passing, she is not doing well.   Kristen [name] states that she goes to [employer] every day.  "I know my mama is not there, she is in heaven, but I miss her so much."  Up until her mom's death a few weeks ago, Kristen [name] had been staying with her every night for the past year.  Even  before that she and her mom were very close and would talk several times a day sometimes, at least once a day.  Now Kristen [name] does not want to do anything.  States she feels numb.  Cries off and on every day.  Not really sleeping well even though she has enough time to sleep.  She is doing her best to stay busy, cleaning house or what ever to help get her mind off of the grief.  On 07/22/2019, she called and complained of worsening of irritability.  At that time I increased the Seroquel XR to 1000 mg nightly.  She states even though it is been a few days, the irritability is better.  She had gotten to a point where she was lashing out at her husband really bad, for no reason.  That has gotten better.  Even though she could increase her benzo, she has not really needed or wanted to.  Patient denies increased energy with decreased need for sleep, no increased talkativeness, no racing thoughts, no impulsivity or risky behaviors, no increased spending, no increased libido, no grandiosity, no increased irritability or anger at this time but she does have periods where she gets more irritable than the situation may warrant, and she denies any hallucinations.  Continues to need the Xanax for anxiety.  It is more of a generalized anxiety with the feeling that something bad is going to happen.  The Xanax is helpful.  She is sleeping much better with the  Sonata.  Denies dizziness, syncope, seizures, numbness, tingling, tremor, tics, unsteady gait, slurred speech, confusion. Denies muscle or joint pain, stiffness, or dystonia.  Individual Medical History/ Review of Systems: Changes? :No    Past medications for mental health diagnoses include: Prozac, Paxil, Lexapro, Celexa, Zoloft, Viibryd, Effexor, Cymbalta, Risperdal, Latuda, Lamictal, VPA, Lithium-became toxic,Xanax, Wellbutrin, Abilify, Willette Alma, Strattera, Kapvay, Saphris, Adderall, Vyvanse, Lunesta, Sonata, CBZ, Vraylar, Seroquel, Geodon, Tegretal  Allergies:  Iohexol  Current Medications:  Current Outpatient Medications:  .  acetaminophen (TYLENOL) 325 MG tablet, Take 2 tablets (650 mg total) by mouth every 6 (six) hours as needed for mild pain (or Fever >/= 101)., Disp: 12 tablet, Rfl: 1 .  ALPRAZolam (XANAX) 1 MG tablet, Take 1 tablet (1 mg total) by mouth 3 (three) times daily as needed. for anxiety, Disp: 90 tablet, Rfl: 5 .  Azelastine HCl 137 MCG/SPRAY SOLN, SMARTSIG:1-2 Spray(s) Both Nares Twice Daily PRN, Disp: , Rfl:  .  buPROPion (WELLBUTRIN XL) 150 MG 24 hr tablet, TAKE 3 TABLETS BY MOUTH IN THE MORNING, Disp: 90 tablet, Rfl: 5 .  cetirizine (ZYRTEC) 10 MG tablet, Take 10 mg by mouth daily. , Disp: , Rfl:  .  Cholecalciferol (VITAMIN D) 50 MCG (2000 UT) CAPS, Take by mouth daily. , Disp: , Rfl:  .  docusate sodium (STOOL SOFTENER) 100 MG capsule, Take 100 mg by mouth daily., Disp: , Rfl:  .  fluticasone (FLONASE) 50 MCG/ACT nasal spray, Place 2 sprays into both nostrils daily as needed for allergies or rhinitis., Disp: , Rfl:  .  lamoTRIgine (LAMICTAL) 200 MG tablet, Take 2.5 tablets (500 mg total) by mouth daily., Disp: 75 tablet, Rfl: 5 .  levocetirizine (XYZAL) 5 MG tablet, Take 5 mg by mouth every evening., Disp: , Rfl:  .  levothyroxine (SYNTHROID) 75 MCG tablet, Take 75 mcg by mouth daily before breakfast. , Disp: , Rfl:  .  linaclotide (LINZESS) 290 MCG CAPS capsule, Take 1 capsule (290 mcg total) by mouth daily before breakfast., Disp: 30 capsule, Rfl: 5 .  modafinil (PROVIGIL) 200 MG tablet, Take 200 mg by mouth daily. , Disp: , Rfl:  .  montelukast (SINGULAIR) 10 MG tablet, TAKE 1 TABLET BY MOUTH ONCE DAILY, Disp: , Rfl:  .  ondansetron (ZOFRAN) 4 MG tablet, TAKE 1 TABLET BY MOUTH EVERY 6 HOURS AS NEEDED FOR NAUSEA OR VOMITING, Disp: 20 tablet, Rfl: 0 .  pantoprazole (PROTONIX) 40 MG tablet, Take 1 tablet (40 mg total) by mouth 2 (two) times daily with a meal., Disp: 180 tablet, Rfl: 3 .  polyethylene glycol (MIRALAX / GLYCOLAX)  17 g packet, Take 17 g by mouth 2 (two) times daily. (Patient taking differently: Take 17 g by mouth daily as needed. ), Disp: 60 each, Rfl: 1 .  pramipexole (MIRAPEX) 1 MG tablet, Take 1 tablet (1 mg total) by mouth 2 (two) times daily., Disp: 60 tablet, Rfl: 5 .  Pseudoephedrine-Guaifenesin (MUCINEX D MAX STRENGTH) [phone] MG TB12, Take by mouth as needed. , Disp: , Rfl:  .  QUEtiapine (SEROQUEL XR) 400 MG 24 hr tablet, Take 3 tablets (1,200 mg total) by mouth at bedtime., Disp: 75 tablet, Rfl: 5 .  zaleplon (SONATA) 10 MG capsule, Take 1 qhs prn and may repeat 1 for midnocturnal awakening prn if she has 3 hours left to sleep, Disp: 60 capsule, Rfl: 5 .  levofloxacin (LEVAQUIN) 500 MG tablet, Take 500 mg by mouth daily. (Patient not taking: Reported on 07/27/2019), Disp: , Rfl:  .  trimethoprim (TRIMPEX) 100 MG tablet, Take 100 mg by mouth daily., Disp: , Rfl:  Medication Side Effects: none  Family Medical/ Social History: Changes? Mom died on 07-20-19, and patient's husband may have lymphoma.  Work-up is still pending.  MENTAL HEALTH EXAM:  There were no vitals taken for this visit.There is no height or weight on file to calculate BMI.  General Appearance: Casual and Well Groomed  Eye Contact:  Good  Speech:  Clear and Coherent and Normal Rate  Volume:  Normal  Mood:  Depressed  Affect:  Depressed and Tearful  Thought Process:  Goal Directed and Descriptions of Associations: Intact  Orientation:  Full (Time, Place, and Person)  Thought Content: Logical   Suicidal Thoughts:  No  Homicidal Thoughts:  No  Memory:  WNL  Judgement:  Good  Insight:  Good  Psychomotor Activity:  Normal  Concentration:  Concentration: Good  Recall:  Good  Fund of Knowledge: Good  Language: Good  Assets:  Desire for Improvement  ADL's:  Intact  Cognition: WNL  Prognosis:  Good   07/13/2019 labs done through Dr. Mora Appl office: CBC nl, CMP was normal except glucose was 125, alkaline phosphatase  was 156, other LFTs are normal now, lipid panel shows total cholesterol of 285, triglycerides 658, HDL was 42, LDL 123, TSH was 0.99   DIAGNOSES:    ICD-10-CM   1. Bereavement  Z63.4   2. Bipolar disorder with depression ([place])  F31.9   3. 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 Generalized anxiety disorder  F41.1   5. Irritability  R45.4     Receiving Psychotherapy: Yes  Felix Ahmadi   RECOMMENDATIONS:  PDMP was reviewed. I provided 30 minutes nonface-to-face time during this encounter. My condolences and the death of her mom.  And I hope the biopsy on her husband is negative for lymphoma or any other serious illness. At the last visit we had discussed weaning off the modafinil and the pramipexole.  This is not been a good time to do that so we will make no changes. Continue modafinil 200 mg, 1 p.o. daily. Continue Seroquel XR 400 mg, 2.5 pills nightly.  We discussed the fact that this is not a usual way to dose this because it is a long-acting medication but she already feels that it is working so we will leave it the same.  And she is out of what is considered normal range for this medication.  However, at times it is necessary that we increased to this dose.  It does not mean she will have to stay on it.  She understands and agrees. Continue pramipexole 1 mg, 1 p.o. twice daily. Continue Xanax 1 mg, 1 p.o. 3 times daily as needed anxiety.  For now, if she needs a fourth one during the day that is okay due to the grief as well as her husband's pending diagnosis.  If she needs it filled before next scheduled refill is due, that is fine. Continue Wellbutrin XL 150 mg, 3 p.o. every morning. Continue Lamictal 200 mg, 2.5 pills daily. Continue Sonata 10 mg, 1 p.o. nightly as needed and may repeat 1 as needed for mid nocturnal awakening as long as she has 3 hours left to sleep. Continue counseling. Return in 4 weeks.  [name], PA-C

## 2019-07-28 ENCOUNTER — Telehealth: Payer: Self-pay | Admitting: Physician Assistant

## 2019-07-28 NOTE — Telephone Encounter (Signed)
[name], steedley are scheduled for a virtual visit with your provider today.    Just as we do with appointments in the office, we must obtain your consent to participate.  Your consent will be active for this visit and any virtual visit you may have with one of our providers in the next 365 days.    If you have a MyChart account, I can also send a copy of this consent to you electronically.  All virtual visits are billed to your insurance company just like a traditional visit in the office.  As this is a virtual visit, video technology does not allow for your provider to perform a traditional examination.  This may limit your provider's ability to fully assess your condition.  If your provider identifies any concerns that need to be evaluated in person or the need to arrange testing such as labs, EKG, etc, we will make arrangements to do so.    Although advances in technology are sophisticated, we cannot ensure that it will always work on either your end or our end.  If the connection with a video visit is poor, we may have to switch to a telephone visit.  With either a video or telephone visit, we are not always able to ensure that we have a secure connection.   I need to obtain your verbal consent now.   Are you willing to proceed with your visit today?   Kristen [name] has provided verbal consent on 07/28/2019 for a virtual visit (video or telephone).   [name], PA-C 07/28/2019  8:20 AM

## 2019-08-05 NOTE — Telephone Encounter (Signed)
Ms. nashly, olsson are scheduled for a virtual visit with your provider today.    Just as we do with appointments in the office, we must obtain your consent to participate.  Your consent will be active for this visit and any virtual visit you may have with one of our providers in the next 365 days.    If you have a MyChart account, I can also send a copy of this consent to you electronically.  All virtual visits are billed to your insurance company just like a traditional visit in the office.  As this is a virtual visit, video technology does not allow for your provider to perform a traditional examination.  This may limit your provider's ability to fully assess your condition.  If your provider identifies any concerns that need to be evaluated in person or the need to arrange testing such as labs, EKG, etc, we will make arrangements to do so.    Although advances in technology are sophisticated, we cannot ensure that it will always work on either your end or our end.  If the connection with a video visit is poor, we may have to switch to a telephone visit.  With either a video or telephone visit, we are not always able to ensure that we have a secure connection.   I need to obtain your verbal consent now.   Are you willing to proceed with your visit today?   Latressa KEELY DRENNAN has provided verbal consent on 08/05/2019 for a virtual visit (video or telephone).   [name], PA-C 08/05/2019  1:50 PM

## 2019-08-10 ENCOUNTER — Other Ambulatory Visit: Payer: Self-pay | Admitting: Gastroenterology

## 2019-08-23 ENCOUNTER — Encounter: Payer: Self-pay | Admitting: Gastroenterology

## 2019-08-23 ENCOUNTER — Ambulatory Visit: Payer: 59 | Admitting: Gastroenterology

## 2019-08-23 ENCOUNTER — Other Ambulatory Visit: Payer: Self-pay

## 2019-08-23 ENCOUNTER — Telehealth: Payer: Self-pay | Admitting: Gastroenterology

## 2019-08-23 VITALS — BP 117/72 | HR 105 | Temp 96.8°F | Ht 67.0 in | Wt 240.6 lb

## 2019-08-23 DIAGNOSIS — K219 Gastro-esophageal reflux disease without esophagitis: Secondary | ICD-10-CM

## 2019-08-23 DIAGNOSIS — K5909 Other constipation: Secondary | ICD-10-CM | POA: Diagnosis not present

## 2019-08-23 NOTE — Progress Notes (Signed)
Primary Care Physician: Manon Hilding, MD  Primary Gastroenterologist:  Garfield Cornea, MD   Chief Complaint  Patient presents with  . Nausea    after eating; binge eats when she does eat  . Constipation    better, having more bm's with Linzess and Miralax    HPI: Kristen [name] is a 49 y.o. female here for follow-up.  She has a history of chronic constipation, abdominal bloating/gas, GERD.  Last seen in March 2021.  History of persisting leukocytosis, referred to hematology.  EGD and colonoscopy June 2020: Small hiatal hernia, erythematous mucosa in the stomach with a healing gastric ulcer.  Biopsies negative.  Scattered diverticula in the colon, nonbleeding internal hemorrhoids, 5 mm splenic flexure tubular adenoma removed.  5-year surveillance colonoscopy recommended given family history of colon cancer.   Under a lot of stress.  Mother died back in Jul 07, 2022.  Has been recently diagnosed with sarcoidosis.  Sees pulmonologist this week.  At first there was concern for lymphoma but this was ruled out.  She continues to follow-up with her psychiatrist/counselor for her anxiety and depression.  From a GI standpoint she states her reflux is well controlled now on pantoprazole twice daily.  No longer requiring Pepcid or Tums.  Bowel movements fairly well controlled on Linzess.  Has bowel movement several times per week, some days multiple stools.  No melena or rectal bleeding.  Her weight is down about 10 pounds from his highest this year.  She continues to note poor eating habits.  This morning had a cupcake for breakfast.  In the evening she tends to overeat.  She reports that she "binges".  Then she feels miserable and nauseated but is not vomiting.  At other times eats without any symptoms.  Nausea only occurs with binging.  Often eats during the middle the night when she has trouble sleeping.  States years ago she abused laxatives.  She has had a history of binging in the past.  No history  of purging.  Has discussed these issues with her psychiatrist.  History of elevated alkaline phosphatase.  AMA negative.  Abdominal ultrasound showed fatty liver, prominent spleen.  Current Outpatient Medications  Medication Sig Dispense Refill  . acetaminophen (TYLENOL) 325 MG tablet Take 2 tablets (650 mg total) by mouth every 6 (six) hours as needed for mild pain (or Fever >/= 101). 12 tablet 1  . ALPRAZolam (XANAX) 1 MG tablet Take 1 tablet (1 mg total) by mouth 3 (three) times daily as needed. for anxiety (Patient taking differently: Take 1 mg by mouth 3 (three) times daily. for anxiety) 90 tablet 5  . Azelastine HCl 137 MCG/SPRAY SOLN SMARTSIG:1-2 Spray(s) Both Nares Twice Daily PRN    . buPROPion (WELLBUTRIN XL) 150 MG 24 hr tablet TAKE 3 TABLETS BY MOUTH IN THE MORNING 90 tablet 5  . cetirizine (ZYRTEC) 10 MG tablet Take 10 mg by mouth daily.     . fluticasone (FLONASE) 50 MCG/ACT nasal spray Place 2 sprays into both nostrils daily as needed for allergies or rhinitis.    Marland Kitchen lamoTRIgine (LAMICTAL) 200 MG tablet Take 2.5 tablets (500 mg total) by mouth daily. 75 tablet 5  . levocetirizine (XYZAL) 5 MG tablet Take 5 mg by mouth every evening.    Marland Kitchen levothyroxine (SYNTHROID) 75 MCG tablet Take 75 mcg by mouth daily before breakfast.     . linaclotide (LINZESS) 290 MCG CAPS capsule Take 1 capsule (290 mcg total) by mouth daily  before breakfast. 30 capsule 5  . modafinil (PROVIGIL) 200 MG tablet Take 200 mg by mouth daily.     . montelukast (SINGULAIR) 10 MG tablet TAKE 1 TABLET BY MOUTH ONCE DAILY    . ondansetron (ZOFRAN) 4 MG tablet TAKE 1 TABLET BY MOUTH EVERY 6 HOURS AS NEEDED FOR NAUSEA OR VOMITING 30 tablet 3  . pantoprazole (PROTONIX) 40 MG tablet Take 1 tablet (40 mg total) by mouth 2 (two) times daily with a meal. 180 tablet 3  . polyethylene glycol (MIRALAX / GLYCOLAX) 17 g packet Take 17 g by mouth 2 (two) times daily. (Patient taking differently: Take 17 g by mouth daily as needed. )  60 each 1  . pramipexole (MIRAPEX) 1 MG tablet Take 1 tablet (1 mg total) by mouth 2 (two) times daily. 60 tablet 5  . Pseudoephedrine-Guaifenesin (MUCINEX D MAX STRENGTH) [phone] MG TB12 Take by mouth 2 (two) times daily.     . QUEtiapine (SEROQUEL XR) 400 MG 24 hr tablet Take 3 tablets (1,200 mg total) by mouth at bedtime. (Patient taking differently: Take 800 mg by mouth at bedtime. ) 75 tablet 5  . trimethoprim (TRIMPEX) 100 MG tablet Take 100 mg by mouth daily.    . zaleplon (SONATA) 10 MG capsule Take 1 qhs prn and may repeat 1 for midnocturnal awakening prn if she has 3 hours left to sleep 60 capsule 5   No current facility-administered medications for this visit.    Allergies as of 08/23/2019 - Review Complete 08/23/2019  Allergen Reaction Noted  . Iohexol Hives 11/03/2007    ROS:  General: Negative for anorexia, weight loss, fever, chills, fatigue, weakness. ENT: Negative for hoarseness, difficulty swallowing , nasal congestion. CV: Negative for chest pain, angina, palpitations, dyspnea on exertion, peripheral edema.  Respiratory: Negative for dyspnea at rest, dyspnea on exertion, cough, sputum, wheezing.  GI: See history of present illness. GU:  Negative for dysuria, hematuria, urinary incontinence, urinary frequency, nocturnal urination.  Endo: Negative for unusual weight change.    Physical Examination:   BP 117/72   Pulse (!) 105   Temp (!) 96.8 F (36 C) (Oral)   Ht 5\' 7"  (1.702 m)   Wt 240 lb 9.6 oz (109.1 kg)   BMI 37.68 kg/m   General: Well-nourished, well-developed in no acute distress.  Eyes: No icterus. Mouth: mased. Abdomen: Bowel sounds are normal, nontender, nondistended, no hepatosplenomegaly or masses, no abdominal bruits or hernia , no rebound or guarding.   Extremities: No lower extremity edema. No clubbing or deformities. Neuro: Alert and oriented x 4   Skin: Warm and dry, no jaundice.   Psych: Alert and cooperative, normal mood and affect.     Lab Results  Component Value Date   CREATININE 1.10 (H) 07/12/2018   BUN 9 07/12/2018   NA 147 (H) 07/12/2018   K 3.2 (L) 07/12/2018   CL 116 (H) 07/12/2018   CO2 17 (L) 07/12/2018   Lab Results  Component Value Date   ALT 19 07/11/2018   AST 18 07/11/2018   ALKPHOS 176 (H) 07/11/2018   BILITOT 0.5 07/11/2018   Lab Results  Component Value Date   WBC 10.2 06/10/2019   HGB 12.3 06/10/2019   HCT 38.1 06/10/2019   MCV 91.8 06/10/2019   PLT 361 06/10/2019   No results found for: HAV, HEPAIGM, HEPBIGM, HEPBCAB, HBEAG, HEPCAB  Lab Results  Component Value Date   IRON 14 (L) 07/10/2018   TIBC 252 07/10/2018  FERRITIN 289 07/10/2018     Imaging Studies: No results found.   Impression/plan:  49 year old female with history of chronic GERD, constipation, fatty liver (with elevated alkaline phosphatase, ALT of 37) presenting for follow-up.  GERD: Well-controlled on pantoprazole 40 mg twice daily.  Weight is down about 10 pounds.  She continues to struggles with dietary choices and overeating.  Encouraged her to continue striving for better diet.  Declined nutrition consult.  Continue current regimen.  Return to the office in 1 year or call sooner if needed.  Constipation: Well-controlled on Linzess.  Continue current regimen.  Nausea: Related to overeating.  Occurs only with binging.  Encouraged her to continue discuss with her psychiatrist.  Zofran provided previously to be used as needed, encouraged her to use sparingly.  Call with any worsening symptoms.  Fatty liver: Elevated alkaline phosphatase with negative AMA.  Mildly elevated ALT.  Encourage diet and exercise.  10 pound weight loss in the next 4 to 6 months.  obtain the latest labs from PCP for review.  Return to the office in 1 year.

## 2019-08-23 NOTE — Patient Instructions (Signed)
1. Continue pantoprazole and Linzess as before for reflux and constipation.  2. Continue Zofran as needed for nausea.  3. Try your best to limit sweets and overeating. Add exercise as tolerated. Continue to monitor your weight. Goal of 10 pound weight loss in the next 4 to 6 months.  4. Return to the office in one year or call sooner if you have any questions or concerns.

## 2019-08-23 NOTE — Telephone Encounter (Signed)
Please request last labs from PCP. Specifically need LFTs. Any previous hepatitis labs.

## 2019-08-24 ENCOUNTER — Telehealth: Payer: Medicare Other | Admitting: Physician Assistant

## 2019-08-25 ENCOUNTER — Telehealth: Payer: Self-pay | Admitting: Physician Assistant

## 2019-08-25 NOTE — Telephone Encounter (Signed)
Pt called and said that since her Seroquel has been increased she has been sleeping a lot and her speech is slurred. Pt went back to normal dose. Please call.

## 2019-08-25 NOTE — Telephone Encounter (Signed)
requested

## 2019-08-26 ENCOUNTER — Other Ambulatory Visit: Payer: Self-pay | Admitting: Gastroenterology

## 2019-08-26 MED ORDER — DICYCLOMINE HCL 10 MG PO CAPS: 10.0000 mg | ORAL_CAPSULE | Freq: Three times a day (TID) | ORAL | 0 refills | Status: DC

## 2019-08-26 NOTE — Telephone Encounter (Signed)
Yes, no changes.  Let us see how she feels after decreasing the Seroquel, and she and I can discuss it at her appointment.

## 2019-08-26 NOTE — Progress Notes (Signed)
rx sent for bentyl

## 2019-08-27 NOTE — Telephone Encounter (Signed)
Patient aware.

## 2019-08-30 ENCOUNTER — Encounter: Payer: Self-pay | Admitting: *Deleted

## 2019-08-30 ENCOUNTER — Telehealth: Payer: Self-pay | Admitting: Internal Medicine

## 2019-08-30 ENCOUNTER — Other Ambulatory Visit: Payer: Self-pay

## 2019-08-30 DIAGNOSIS — R197 Diarrhea, unspecified: Secondary | ICD-10-CM

## 2019-08-30 NOTE — Telephone Encounter (Signed)
Kristen [name], thanks for your help. I knew the messages had not been routed yet by [facility] but since patient was calling we will go with telephone encounter for now. I'll send her a quick message to close out the [place] encounter.

## 2019-08-30 NOTE — Telephone Encounter (Signed)
I have not been routed any mychart message yet.   Would recommend stool studies for persistent diarrhea.   Stool for Cdiff [facility] and GI pathogen panel. I will put in order but doubt the lab will accept a specimen already collected. She can take it to [facility] and ask but anticipate them giving her new containers.

## 2019-08-30 NOTE — Telephone Encounter (Signed)
Noted  

## 2019-08-30 NOTE — Telephone Encounter (Signed)
LSL, [facility] Clinical pool usually routes the messages to the provider and me. When the pt called this morning, I was able to see she left several mychart messages. AS, is going to route messages to you that pt left last night and today.  I have spoken with pt. Pt reports no pain, just diarrhea and doesn't need to go to the ED unless symptoms change. Pt is aware that she will go to [facility] and get instructions from them to complete stool cultures.

## 2019-08-30 NOTE — Telephone Encounter (Signed)
Patient has called back several times asking about previous message. I told her the nurse was aware and was waiting on reply from provider and someone would call her back.

## 2019-08-30 NOTE — Telephone Encounter (Signed)
Pt has sent Mychart messages from yesterday and this morning and would like to speak to the nurse. [phone]

## 2019-08-30 NOTE — Telephone Encounter (Signed)
LSL, pt has left a few mychart messages and I called pt at 8:45 AM. Pt states that she went to Urgent Care yesterday and pt told them that our office wanted a sample of her stool. Pt received a sterile cup and asked if she could bring the stool to the office to be tested. Pt says she placed it in the refrigerator and was told that it needed to come to our office by LSL. Pt is aware that samples have to be turned into [facility] or [facility] with an order. I explained to pt that she will need the labs containers and has to follow their instructions of submitting a stool sample. Pt is still having diarrhea and wants to submit stool sample but isn't happy that our office won't except the stool. Pt said she wouldn't have done a stool sample if she wasn't told to. Please advise if orders need to be submitted. Pt didn't receive stool orders from urgent care yesterday.

## 2019-08-30 NOTE — Progress Notes (Signed)
Opened in error

## 2019-08-31 IMAGING — CR PORTABLE CHEST - 1 VIEW
1 series · 1 of 1 positions shown · non-contrast
Comparison: July 08, 2018

CLINICAL DATA: Confusion with hallucinations

EXAM:
PORTABLE CHEST 1 VIEW

[ap]
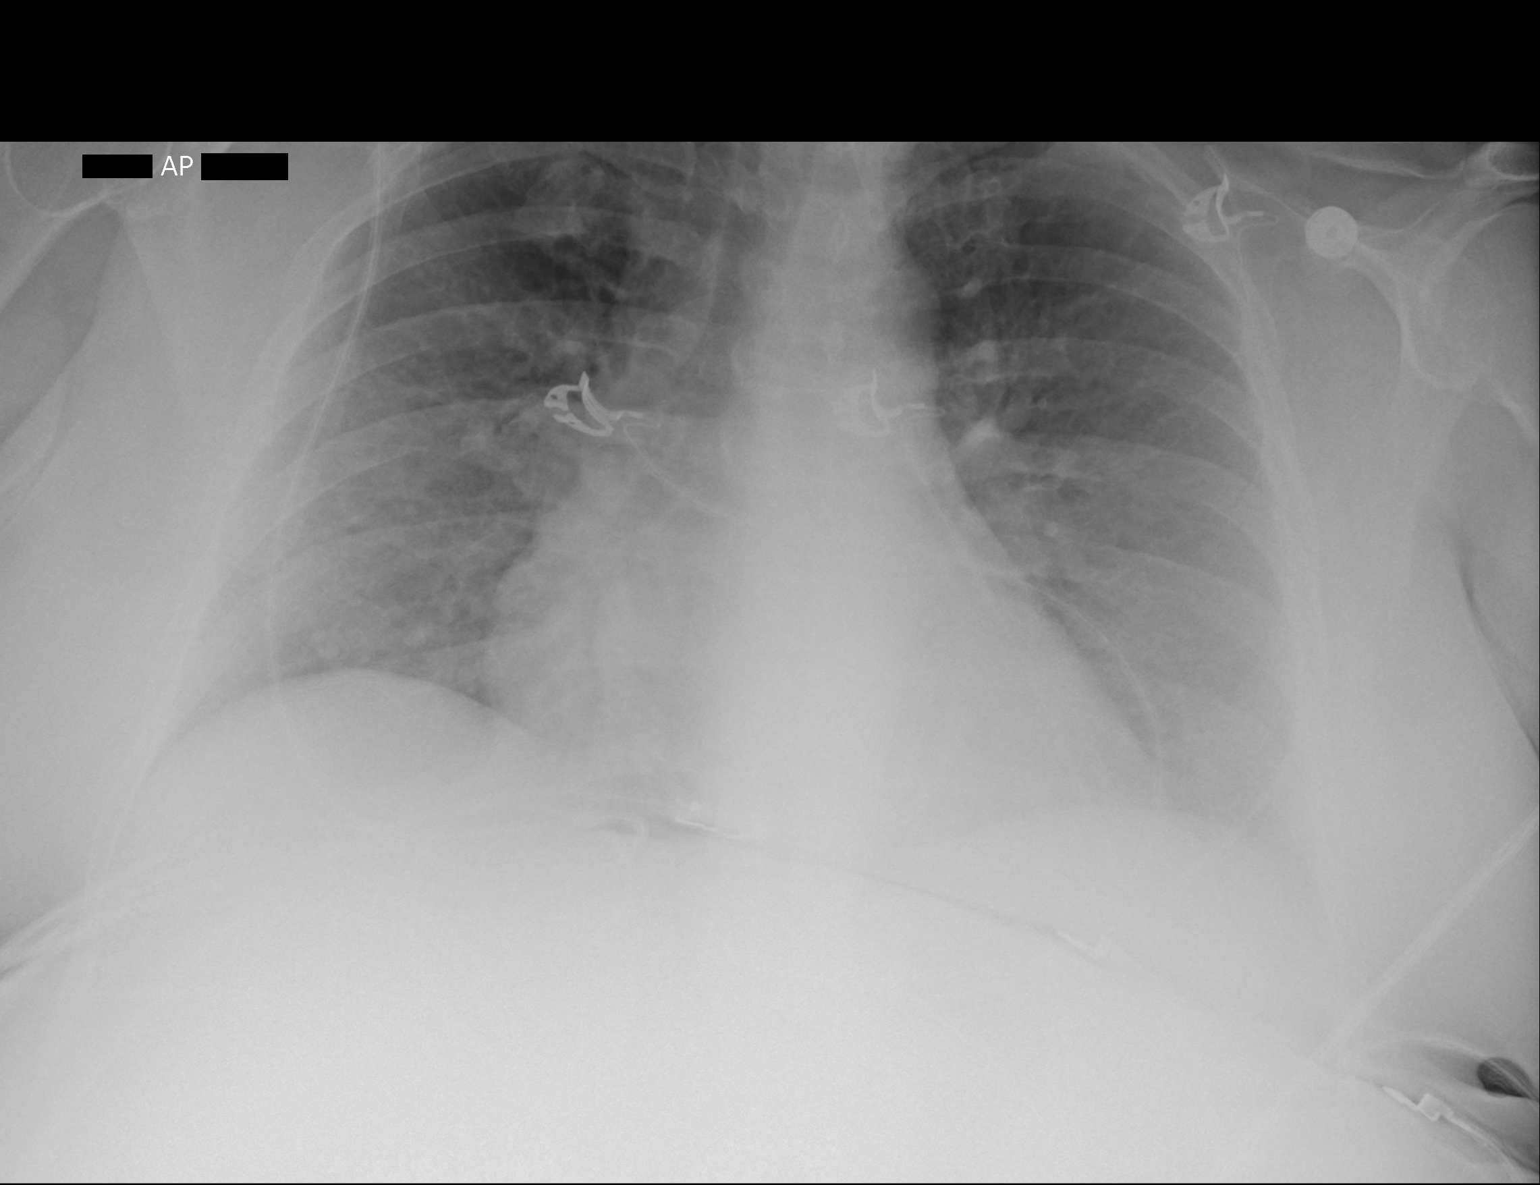

[1 of 1 positions shown; findings below may reference images not displayed]

FINDINGS: There is no appreciable edema or consolidation. Heart is mildly
enlarged with pulmonary vascularity normal. No adenopathy. There is
postoperative change in the lower cervical region.
IMPRESSION: Mild cardiomegaly.  No edema or consolidation.

## 2019-08-31 IMAGING — CT CT HEAD WITHOUT CONTRAST
3 of 4 series · 15 of 47 positions shown, 18 images · non-contrast
Comparison: None.

CLINICAL DATA: Altered mental status. Right-sided weakness and
slurred speech.

EXAM:
CT HEAD WITHOUT CONTRAST
TECHNIQUE: Contiguous axial images were obtained from the base of the skull
through the vertex without intravenous contrast.

[Series 2: head w o · axial · 0.46mm/px · z∈[+15,+150]mm · 9 of 34 slices shown, 12 images]
[im 4/34  brain]
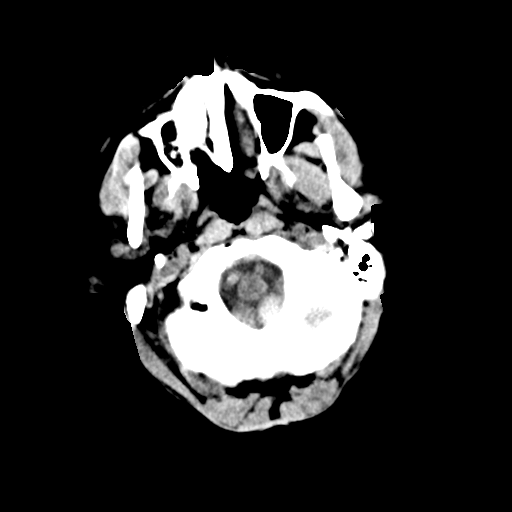
[im 4/34  bone]
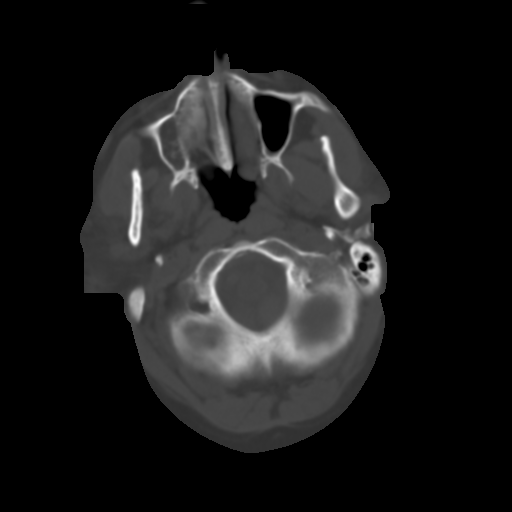
[im 7/34  brain]
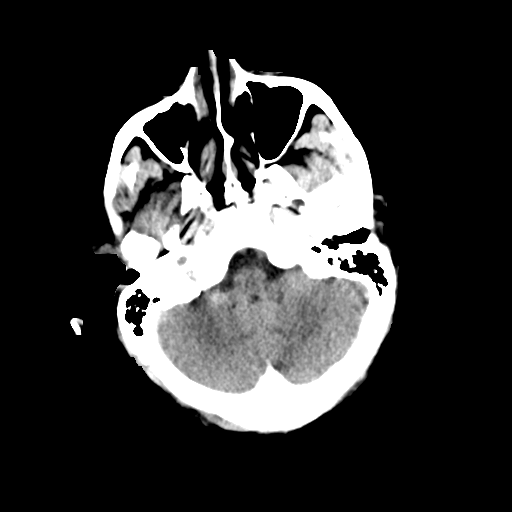
[im 10/34  brain]
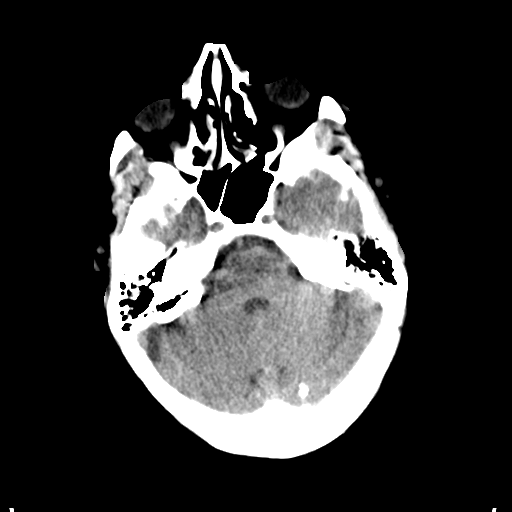
[im 14/34  brain]
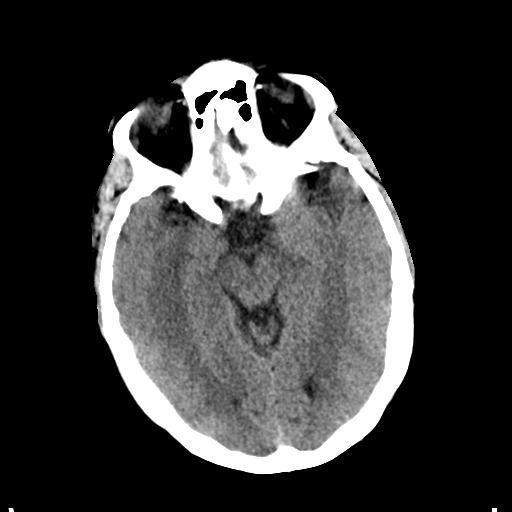
[im 17/34  brain]
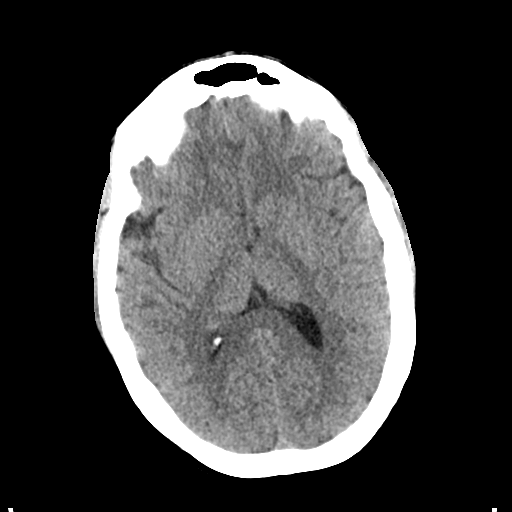
[im 17/34  bone]
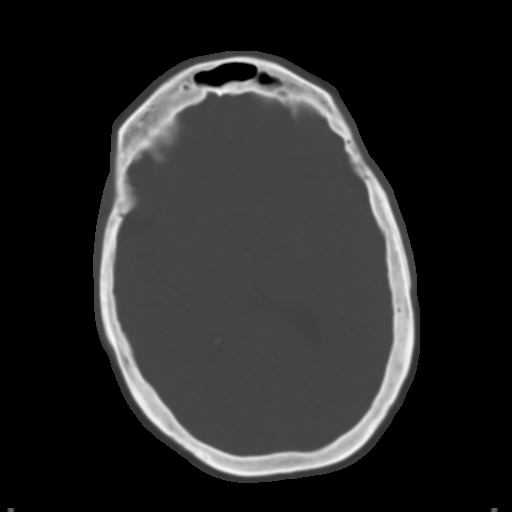
[im 20/34  brain]
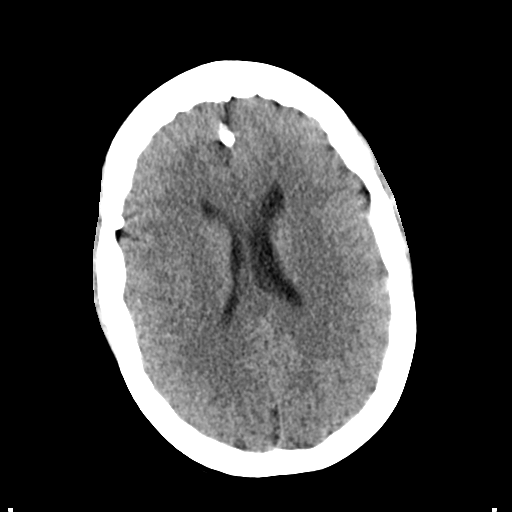
[im 25/34  brain]
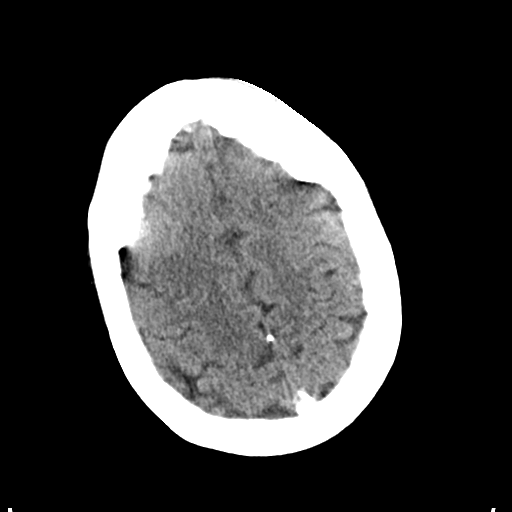
[im 28/34  brain]
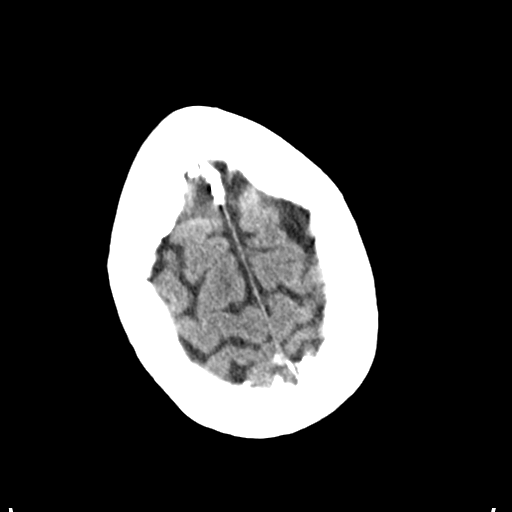
[im 31/34  brain]
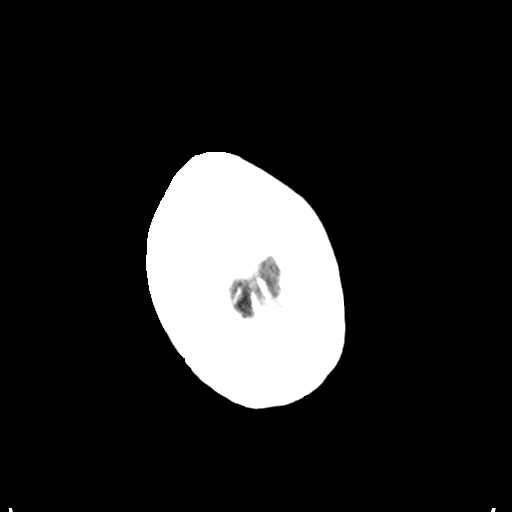
[im 31/34  bone]
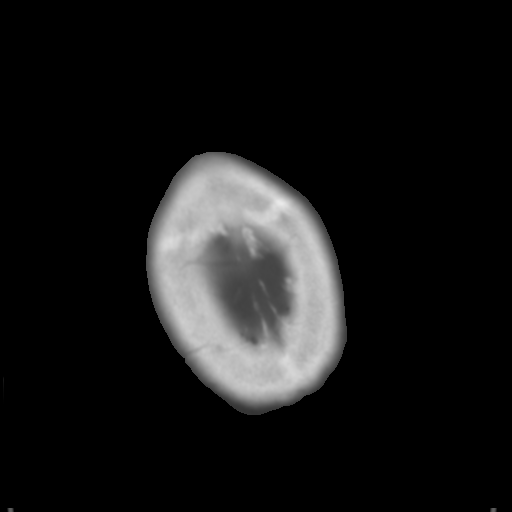

[Series 4: coronal soft · coronal · 0.35mm/px · 3 of 73 slices shown]
[im 25/73  brain]
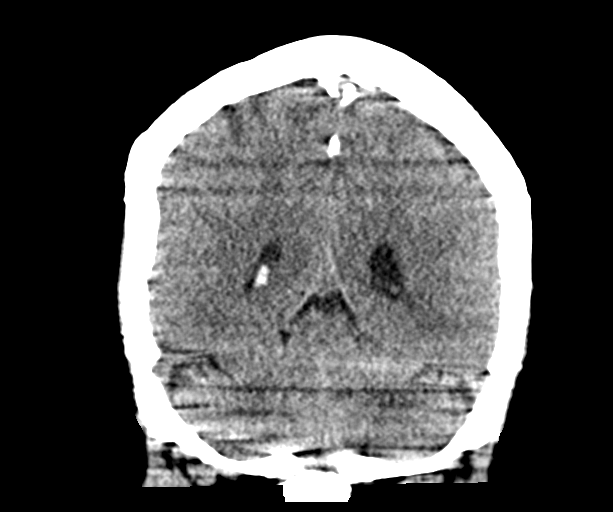
[im 33/73  brain]
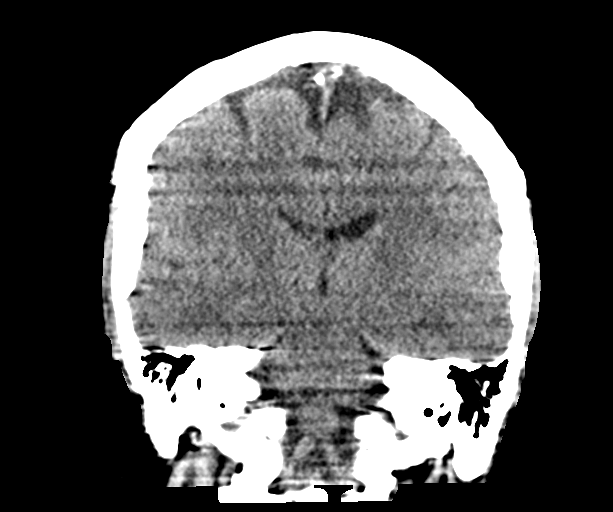
[im 41/73  brain]
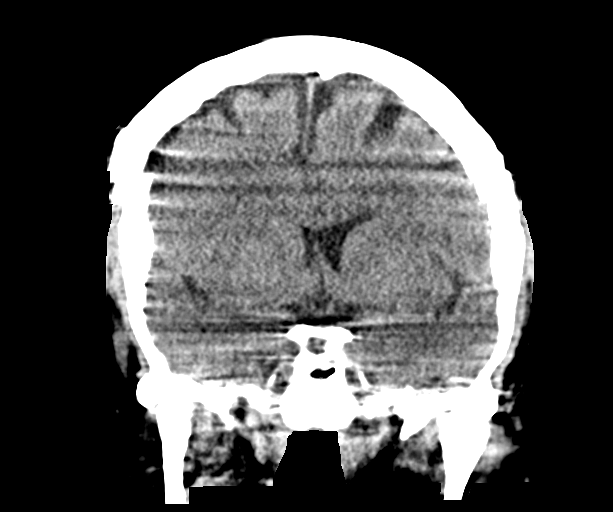

[Series 5: sagittal soft · sagittal · 0.35mm/px · 3 of 61 slices shown]
[im 21/61  brain]
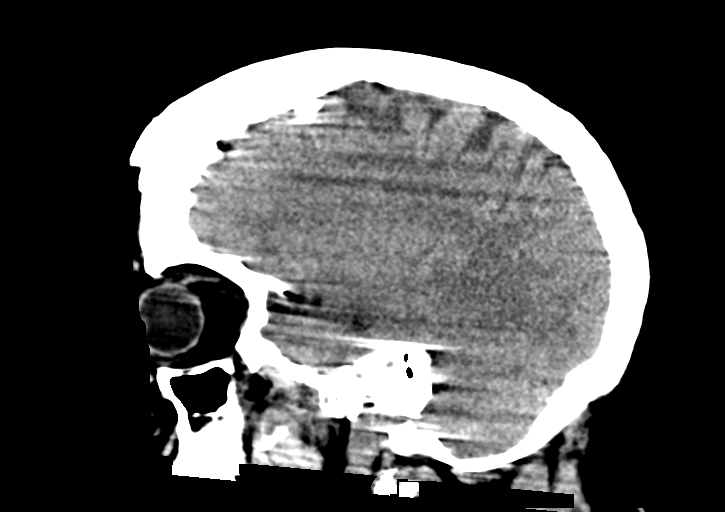
[im 31/61  brain]
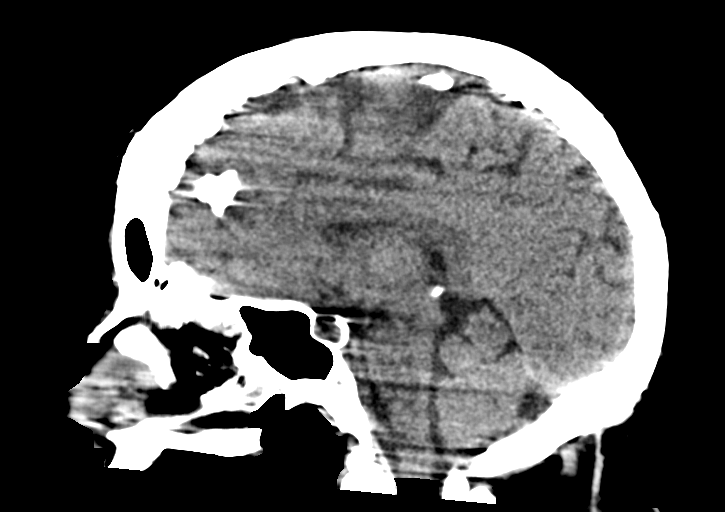
[im 41/61  brain]
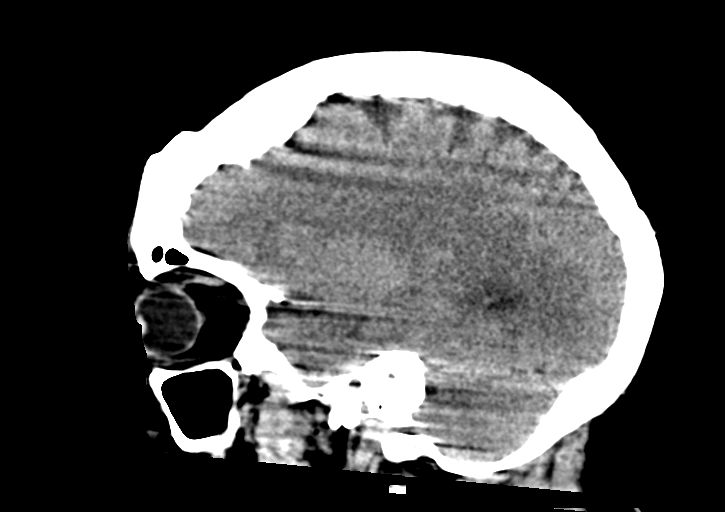

[15 of 47 positions shown; findings below may reference images not displayed]

FINDINGS: Brain: There is no evidence of acute infarct, intracranial
hemorrhage, mass, midline shift, or extra-axial fluid collection.
The ventricles and sulci are normal.

Vascular: No hyperdense vessel.

Skull: No fracture or focal osseous lesion.

Sinuses/Orbits: Small right maxillary sinus. No evidence of acute
sinus inflammation. Unremarkable orbits.

Other: None.
IMPRESSION: Unremarkable CT appearance of the brain.

## 2019-09-02 ENCOUNTER — Telehealth: Payer: Self-pay | Admitting: Internal Medicine

## 2019-09-02 ENCOUNTER — Other Ambulatory Visit: Payer: Self-pay | Admitting: Gastroenterology

## 2019-09-02 LAB — GASTROINTESTINAL PATHOGEN PANEL PCR
C. difficile Tox A/B, PCR: NOT DETECTED
Campylobacter, PCR: NOT DETECTED
Cryptosporidium, PCR: NOT DETECTED
E coli (ETEC) LT/ST PCR: NOT DETECTED
E coli (STEC) stx1/stx2, PCR: NOT DETECTED
E coli 0157, PCR: NOT DETECTED
Giardia lamblia, PCR: DETECTED — AB
Norovirus, PCR: NOT DETECTED
Rotavirus A, PCR: NOT DETECTED
Salmonella, PCR: NOT DETECTED
Shigella, PCR: NOT DETECTED

## 2019-09-02 LAB — C. DIFFICILE GDH AND TOXIN A/B
GDH ANTIGEN: NOT DETECTED
MICRO NUMBER:: 10729036
SPECIMEN QUALITY:: ADEQUATE
TOXIN A AND B: NOT DETECTED

## 2019-09-02 MED ORDER — TINIDAZOLE 500 MG PO TABS: 2.0000 g | ORAL_TABLET | Freq: Once | ORAL | 0 refills | Status: AC

## 2019-09-02 NOTE — Telephone Encounter (Signed)
Sometime next month when she is feeling better let's have her do the following labs to check into elevated Alk phos further. She has completed AMA which was negative.   Need LFTs, and GGT.

## 2019-09-02 NOTE — Telephone Encounter (Signed)
Pt is still having diarrhea. Please call [phone]

## 2019-09-02 NOTE — Telephone Encounter (Signed)
Received labs from PCP dated 08/31/2019:  Vitamin D level slightly low at 26.2, lipase 15, TSH 3.150, amylase 20 glucose 97, BUN 13, creatinine 0.98, sodium 141, potassium 4, albumin 4.7, total bilirubin 0.4, alkaline phosphatase 178 slightly elevated, AST 20, ALT 27, white blood cell count 12,300 elevated, hemoglobin 12.8, platelets 465,000  Also reviewed older labs received dated 07/13/2019, alkaline phosphatase 156, AST 24, ALT 24, total bilirubin 0.3, white blood cell count 10,400, hemoglobin 12.4, TSH 0.997.    So her WBC count and platelets slightly elevated which suggests infection. Alk phos slightly elevated and has been in the past and unlikely related to current illness.   PLEASE LET PT KNOW THAT MOST BUGS THAT CAUSE INFECTIOUS DIARRHEA DO NOT REQUIRE ANTIBIOTICS BUT THERE ARE A COUPLE THAT DO AND REQUIRE SPECIFIC TYPES OF ANTIBIOTICS. AT THIS POINT, I WOULD HOLD OFF ON THE GI PATHOGEN PANEL, HOPEFULLY WILL RESULT TODAY.   CAN INCREASE BENTYL TO 20MG QID.  WE WILL LOOK INTO ELEVATED ALK PHOS AFTER SHE IS OVER ACUTE ILLNESS (I HAVE ACTUALLY ORDERED LABS FOR THIS BACK IN 04/2019 BUT WITH EVERYTHING GOING ON WITH HER I THINK IT JUST GOT LOST IN THE WORK UP)

## 2019-09-02 NOTE — Telephone Encounter (Signed)
Pt returned call. Pt is still having watery diarrhea several times a day. Pt sent to her PCP office on Tuesday 08/31/19. Pt lost 13 pounds. Pts labs were drawn and she's having results faced to our office. Pts PCP asked her to Bentyl that was prescribed this month. Pt saw stool study results on mychart. Pt says she feels she has an infection but isn't sure what it is. Some of pts levels were elevated when she had her blood drawn per pt.

## 2019-09-02 NOTE — Telephone Encounter (Signed)
Spoke with pt. Pt was notified of results and recommendations of taking Bentyl 20 mg QID. Pt states that she is currently taking that medication 4 times daily and will continue. Pt is ware that the Gi-pathogen panel has resulted and we will notify pt of results when they result.

## 2019-09-02 NOTE — Telephone Encounter (Signed)
Lmom, waiting on a return call.  

## 2019-09-03 ENCOUNTER — Ambulatory Visit (INDEPENDENT_AMBULATORY_CARE_PROVIDER_SITE_OTHER): Payer: 59 | Admitting: Physician Assistant

## 2019-09-03 ENCOUNTER — Encounter: Payer: Self-pay | Admitting: Physician Assistant

## 2019-09-03 ENCOUNTER — Other Ambulatory Visit: Payer: Self-pay

## 2019-09-03 DIAGNOSIS — L7211 Pilar cyst: Secondary | ICD-10-CM | POA: Diagnosis not present

## 2019-09-03 DIAGNOSIS — D1801 Hemangioma of skin and subcutaneous tissue: Secondary | ICD-10-CM

## 2019-09-03 DIAGNOSIS — Z1283 Encounter for screening for malignant neoplasm of skin: Secondary | ICD-10-CM | POA: Diagnosis not present

## 2019-09-03 NOTE — Progress Notes (Signed)
   Follow-Up Visit   Subjective  Kristen [name] is a 49 y.o. female who presents for the following: Skin Problem (Check right leg small dark spot x 6 months to a year. Wont go away. Also check patients scalp she has a couple pilar cysts. ). Patient was upset due to the passing of her mother.   The following portions of the chart were reviewed this encounter and updated as appropriate: Tobacco  Allergies  Meds  Problems  Med Hx  Surg Hx  Fam Hx      Objective  Well appearing patient in no apparent distress; mood and affect are within normal limits.  A focused examination was performed including legs, face, arms and scalp. Relevant physical exam findings are noted in the Assessment and Plan.  Objective  Scalp: No atypical nevi No signs of non-mole skin cancer.   Objective  Right Lower Leg - Anterior: Purple papule  Objective  Mid Frontal Scalp, Mid Parietal Scalp: White nodule   Assessment & Plan  Screening exam for skin cancer Scalp  Hemangioma of skin Right Lower Leg - Anterior  observe  Pilar cyst (2) Mid Frontal Scalp; Mid Parietal Scalp  30 minute surgery    I, Mahaila Tischer, PA-C, have reviewed all documentation's for this visit.  The documentation on 09/03/19 for the exam, diagnosis, procedures and orders are all accurate and complete.

## 2019-09-05 ENCOUNTER — Emergency Department (HOSPITAL_BASED_OUTPATIENT_CLINIC_OR_DEPARTMENT_OTHER)
Admission: EM | Admit: 2019-09-05 | Discharge: 2019-09-05 | Disposition: A | Discharge status: Home / Self Care | Payer: 59 | Attending: Emergency Medicine | Admitting: Emergency Medicine

## 2019-09-05 ENCOUNTER — Emergency Department (HOSPITAL_BASED_OUTPATIENT_CLINIC_OR_DEPARTMENT_OTHER): Payer: 59

## 2019-09-05 ENCOUNTER — Other Ambulatory Visit: Payer: Self-pay

## 2019-09-05 ENCOUNTER — Encounter (HOSPITAL_BASED_OUTPATIENT_CLINIC_OR_DEPARTMENT_OTHER): Payer: Self-pay

## 2019-09-05 DIAGNOSIS — R002 Palpitations: Secondary | ICD-10-CM | POA: Diagnosis present

## 2019-09-05 DIAGNOSIS — R000 Tachycardia, unspecified: Secondary | ICD-10-CM | POA: Insufficient documentation

## 2019-09-05 DIAGNOSIS — E669 Obesity, unspecified: Secondary | ICD-10-CM | POA: Diagnosis not present

## 2019-09-05 DIAGNOSIS — R5383 Other fatigue: Secondary | ICD-10-CM | POA: Diagnosis not present

## 2019-09-05 DIAGNOSIS — Z87891 Personal history of nicotine dependence: Secondary | ICD-10-CM | POA: Insufficient documentation

## 2019-09-05 DIAGNOSIS — R0602 Shortness of breath: Secondary | ICD-10-CM | POA: Insufficient documentation

## 2019-09-05 DIAGNOSIS — E039 Hypothyroidism, unspecified: Secondary | ICD-10-CM | POA: Insufficient documentation

## 2019-09-05 LAB — CBC
HCT: 37.5 % (ref 36.0–46.0)
Hemoglobin: 12 g/dL (ref 12.0–15.0)
MCH: 28.5 pg (ref 26.0–34.0)
MCHC: 32 g/dL (ref 30.0–36.0)
MCV: 89.1 fL (ref 80.0–100.0)
Platelets: 426 10*3/uL — ABNORMAL HIGH (ref 150–400)
RBC: 4.21 MIL/uL (ref 3.87–5.11)
RDW: 14.6 % (ref 11.5–15.5)
WBC: 11.7 10*3/uL — ABNORMAL HIGH (ref 4.0–10.5)
nRBC: 0 % (ref 0.0–0.2)

## 2019-09-05 LAB — BASIC METABOLIC PANEL
Anion gap: 12 (ref 5–15)
BUN: 10 mg/dL (ref 6–20)
CO2: 23 mmol/L (ref 22–32)
Calcium: 8.9 mg/dL (ref 8.9–10.3)
Chloride: 108 mmol/L (ref 98–111)
Creatinine, Ser: 0.7 mg/dL (ref 0.44–1.00)
GFR calc Af Amer: >60 mL/min (ref >60)
GFR calc non Af Amer: >60 mL/min (ref >60)
Glucose, Bld: 131 mg/dL — ABNORMAL HIGH (ref 70–99)
Potassium: 3.7 mmol/L (ref 3.5–5.1)
Sodium: 143 mmol/L (ref 135–145)

## 2019-09-05 LAB — TROPONIN I (HIGH SENSITIVITY): Troponin I (High Sensitivity): 4 ng/L (ref <18)

## 2019-09-05 NOTE — ED Notes (Signed)
Pt states her HR has been high all week. She has also been experience SOB for several weeks and fatigued. Pt went to UC today when her HR was 125. They did labs and told her that she was dehydrated. They started an IV and gave her fluid. She was sent here for an abnormal EKG

## 2019-09-05 NOTE — ED Triage Notes (Signed)
Pt arrives with c/o feeling like her chest "was beating out of her body" Pt reports when she woke up her HR was 118, went to [facility] and was told that she has abnormal EKG and was told to come to ED.

## 2019-09-05 NOTE — Discharge Instructions (Signed)
Eat and drink well for the next couple days. Please call your doctor tomorrow and let them know about your visit here. Please return to the emergency department for worsening trouble breathing chest pain or pressure or if you pass out.

## 2019-09-05 NOTE — ED Notes (Signed)
ED Provider at bedside. 

## 2019-09-05 NOTE — ED Provider Notes (Signed)
[facility] EMERGENCY DEPARTMENT Provider Note   CSN: [id number] Arrival date & time: 09/05/19  1433     History Chief Complaint  Patient presents with  . Palpitations    Kristen [name] is a 49 y.o. female.  49 yo F with a chief complaints of palpitations and fatigue.  This has been an ongoing issue with her.  Kristen [name] recently had a diarrheal illness that is resolved.  He has had multiple PCP visits as well as visits to urgent care in the past week.  Went to an urgent care earlier today because her heart rate was elevated at home on a blood pressure cuff check.  Found to have a heart rate in the 120s at urgent care reportedly.  Had lab work done there and was told that Kristen [name] was dehydrated and given a bolus of IV fluids.  There is some concern about EKG changes and so Kristen [name] was sent to the ED for evaluation.  Patient denies any chest pain or pressure.  Kristen [name] has had some shortness of breath chronically but not worse recently.  Kristen [name] is feeling generally fatigued and tired.  Kristen [name] has hypothyroidism and takes Synthroid this was apparently checked at the urgent care center and was normal.  Reportedly Kristen [name] also had a leukocytosis and thrombophilia.  Kristen [name] denies any abdominal pain denies ongoing diarrhea denies cough congestion or fever denies nausea or vomiting.  Currently feels much better after fluids but feels tired.  The history is provided by the patient and the spouse.  Palpitations Associated symptoms: shortness of breath (chronic, occurs off and on not worse recently)   Associated symptoms: no chest pain, no dizziness, no nausea and no vomiting   Illness Severity:  Moderate Onset quality:  Gradual Duration:  2 weeks Timing:  Constant Progression:  Worsening Chronicity:  New Associated symptoms: diarrhea (resolved), fatigue and shortness of breath (chronic, occurs off and on not worse recently)   Associated symptoms: no abdominal pain, no chest pain, no congestion, no fever, no  headaches, no myalgias, no nausea, no rhinorrhea, no vomiting and no wheezing        Past Medical History:  Diagnosis Date  . Arthritis   . Atypical mole 11/03/2006   mid upper back (slight to moderate)  . Atypical mole 11/03/2006   lower right back (slight to moderate)  . Bipolar 1 disorder ([place])   . Chronic constipation   . Depression   . Family history of adverse reaction to anesthesia    mother-- ponv  . GAD (generalized anxiety disorder)   . GERD (gastroesophageal reflux disease)   . Hiatal hernia   . History of kidney stones   . History of recurrent UTIs   . History of sepsis 06/2018  . History of suicidal ideation   . Hyperlipidemia   . Hypothyroidism   . IDA (iron deficiency anemia)   . Melanoma ([place]) 11/03/2006   left chest in situ (excision)  . PONV (postoperative nausea and vomiting)   . Renal calculus, left   . Seasonal allergies   . Sleep disorder, unspecified    per excessive sleeping during the day  . Wears contact lenses     Patient Active Problem List   Diagnosis Date Noted  . Leukocytosis 06/10/2019  . Nausea without vomiting 04/19/2019  . Chronic constipation   . GERD (gastroesophageal reflux disease)   . Bloating   . Family history of colon cancer in father   . Anemia   . AKI (acute  kidney injury) ([place]) 07/10/2018  . Lithium toxicity 07/10/2018  . Encephalopathy 07/10/2018  . Acute urinary retention 07/10/2018  . 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 GAD (generalized anxiety disorder) 12/05/2017  . Tremor 12/05/2017  . Insomnia 12/05/2017  . Bipolar I disorder ([place]) 12/05/2017  . Pseudoarthrosis of cervical spine ([place]) 08/18/2013  . Status post cervical spinal arthrodesis 08/18/2013  . Sleep disorder 07/20/2012  . Chronic rhinitis 07/20/2012    Past Surgical History:  Procedure Laterality Date  . ANTERIOR CERVICAL DECOMP/DISCECTOMY FUSION  02/17/2012   Procedure: ANTERIOR CERVICAL DECOMPRESSION/DISCECTOMY FUSION 2 LEVELS;   Surgeon: Hosie Spangle, MD;  Location: [facility];  Service: Neurosurgery;  Laterality: N/A;  Cervical four-five and Cervical six-seven anterior cervial decompression with fusion plating and bonegraft  . ANTERIOR CERVICAL DECOMP/DISCECTOMY FUSION  04-01-2001   '@[facility]'    C5---6  . BIOPSY  07/20/2018   Procedure: BIOPSY;  Surgeon: Daneil Dolin, MD;  Location: [facility];  Service: Endoscopy;;  gastric   . BREAST REDUCTION SURGERY Bilateral 1995  . CESAREAN SECTION  x2   last one 1998   with BILATERAL TUBAL LIGATION  . CESAREAN SECTION WITH BILATERAL TUBAL LIGATION    . COLONOSCOPY WITH PROPOFOL N/A 07/20/2018   Dr. Gala Romney: Diverticulosis, internal hemorrhoids, 5 mm splenic flexure tubular adenoma removed.  . CYSTOSCOPY/URETEROSCOPY/HOLMIUM LASER/STENT PLACEMENT Left 08/21/2018   Procedure: CYSTOSCOPY LEFT RETROGRADE PYELOGRAM /URETEROSCOPY/HOLMIUM LASER/STENT PLACEMENT;  Surgeon: Ceasar Mons, MD;  Location: [facility];  Service: Urology;  Laterality: Left;  . DILATION AND CURETTAGE OF UTERUS  1992   for miscarriage  . ENDOMETRIAL ABLATION  2014  . ESOPHAGOGASTRODUODENOSCOPY     20 years ago.   Marland Kitchen ESOPHAGOGASTRODUODENOSCOPY (EGD) WITH PROPOFOL N/A 07/20/2018   Dr. Gala Romney: Small hiatal hernia, erythematous mucosa in the stomach with a healing gastric ulcer measuring 1 cm, biopsies negative.  Marland Kitchen [place]   unilateral for infertility  . NASAL SINUS SURGERY  2010  approx.  Marland Kitchen POLYPECTOMY  07/20/2018   Procedure: POLYPECTOMY;  Surgeon: Daneil Dolin, MD;  Location: [facility];  Service: Endoscopy;;  . POSTERIOR CERVICAL FUSION/FORAMINOTOMY N/A 08/18/2013   Procedure: CERVICAL FOUR TO CERVICAL SEVEN POSTERIOR CERVICAL FUSION/FORAMINOTOMY LEVEL 3;  Surgeon: Hosie Spangle, MD;  Location: [facility];  Service: Neurosurgery;  Laterality: N/A;  C4-7 posterior cervical fusion with lateral mass fixation     OB History   No obstetric history on file.      Family History  Problem Relation Age of Onset  . Asthma Mother   . Rheum arthritis Mother   . Non-Hodgkin's lymphoma Mother   . Congestive Heart Failure Mother   . Atrial fibrillation Mother   . Diabetes Mother   . Colon cancer Father   . Multiple myeloma Father   . Allergies Other        entire family(mom and dad)  . Diabetes Maternal Grandmother   . Congestive Heart Failure Maternal Grandmother   . Mental illness Paternal Grandmother   . Colon cancer Paternal Grandfather   . Bone cancer Paternal Grandfather     Social History   Tobacco Use  . Smoking status: Former Smoker    Packs/day: 1.00    Years: 5.00    Pack years: 5.00    Types: Cigarettes    Quit date: 02/09/2002    Years since quitting: 17.5  . Smokeless tobacco: Never Used  Vaping Use  . Vaping Use: Never used  Substance  Use Topics  . Alcohol use: No  . Drug use: No    Home Medications Prior to Admission medications   Medication Sig Start Date End Date Taking? Authorizing Provider  acetaminophen (TYLENOL) 325 MG tablet Take 2 tablets (650 mg total) by mouth every 6 (six) hours as needed for mild pain (or Fever >/= 101). 07/12/18   Roxan Hockey, MD  ALPRAZolam Duanne Moron) 1 MG tablet Take 1 tablet (1 mg total) by mouth 3 (three) times daily as needed. for anxiety Patient taking differently: Take 1 mg by mouth 3 (three) times daily. for anxiety 05/24/19   [name] T, PA-C  Azelastine HCl 137 MCG/SPRAY SOLN SMARTSIG:1-2 Spray(s) Both Nares Twice Daily PRN 06/22/19   [provider]  buPROPion (WELLBUTRIN XL) 150 MG 24 hr tablet TAKE 3 TABLETS BY MOUTH IN THE MORNING 04/02/19   [name] T, PA-C  cetirizine (ZYRTEC) 10 MG tablet Take 10 mg by mouth daily.     [provider]  fluticasone (FLONASE) 50 MCG/ACT nasal spray Place 2 sprays into both nostrils daily as needed for allergies or rhinitis.    [provider]  lamoTRIgine (LAMICTAL) 200 MG tablet Take 2.5 tablets (500 mg  total) by mouth daily. 05/03/19   [name] T, PA-C  levocetirizine (XYZAL) 5 MG tablet Take 5 mg by mouth every evening.    [provider]  levothyroxine (SYNTHROID) 75 MCG tablet Take 75 mcg by mouth daily before breakfast.  05/27/18   [provider]  linaclotide Rolan Lipa) 290 MCG CAPS capsule Take 1 capsule (290 mcg total) by mouth daily before breakfast. 04/30/19   Mahala Menghini, PA-C  modafinil (PROVIGIL) 200 MG tablet Take 200 mg by mouth daily.  11/03/18   [provider]  montelukast (SINGULAIR) 10 MG tablet TAKE 1 TABLET BY MOUTH ONCE DAILY 10/26/18   [provider]  ondansetron (ZOFRAN) 4 MG tablet TAKE 1 TABLET BY MOUTH EVERY 6 HOURS AS NEEDED FOR NAUSEA OR VOMITING 08/10/19   Annitta Needs, NP  pantoprazole (PROTONIX) 40 MG tablet Take 1 tablet (40 mg total) by mouth 2 (two) times daily with a meal. 04/07/19   Carlis Stable, NP  polyethylene glycol (MIRALAX / GLYCOLAX) 17 g packet Take 17 g by mouth 2 (two) times daily. Patient taking differently: Take 17 g by mouth daily as needed.  07/12/18   Roxan Hockey, MD  pramipexole (MIRAPEX) 1 MG tablet Take 1 tablet (1 mg total) by mouth 2 (two) times daily. 05/03/19   [name] T, PA-C  Pseudoephedrine-Guaifenesin (MUCINEX D MAX STRENGTH) [phone] MG TB12 Take by mouth 2 (two) times daily.     [provider]  QUEtiapine (SEROQUEL XR) 400 MG 24 hr tablet Take 3 tablets (1,200 mg total) by mouth at bedtime. Patient taking differently: Take 800 mg by mouth at bedtime.  07/27/19   [name] T, PA-C  trimethoprim (TRIMPEX) 100 MG tablet Take 100 mg by mouth daily.    [provider]  zaleplon (SONATA) 10 MG capsule Take 1 qhs prn and may repeat 1 for midnocturnal awakening prn if Kristen [name] has 3 hours left to sleep 07/27/19   [name] T, PA-C    Allergies    Iohexol  Review of Systems   Review of Systems  Constitutional: Positive for fatigue. Negative for chills and fever.  HENT:  Negative for congestion and rhinorrhea.   Eyes: Negative for redness and visual disturbance.  Respiratory: Positive for shortness of breath (  chronic, occurs off and on not worse recently). Negative for wheezing.   Cardiovascular: Positive for palpitations. Negative for chest pain.  Gastrointestinal: Positive for diarrhea (resolved). Negative for abdominal pain, nausea and vomiting.  Genitourinary: Negative for dysuria and urgency.  Musculoskeletal: Negative for arthralgias and myalgias.  Skin: Negative for pallor and wound.  Neurological: Negative for dizziness and headaches.    Physical Exam Updated Vital Signs BP 123/85 (BP Location: Left Arm)   Pulse 103   Temp 98.1 F (36.7 C) (Oral)   Resp 18   Ht '5\' 7"'  (1.702 m)   Wt (!) 109.1 kg   SpO2 97%   BMI 37.68 kg/m   Physical Exam Vitals and nursing note reviewed.  Constitutional:      General: Kristen [name] is not in acute distress.    Appearance: Kristen [name] is well-developed. Kristen [name] is obese. Kristen [name] is not diaphoretic.  HENT:     Head: Normocephalic and atraumatic.  Eyes:     Pupils: Pupils are equal, round, and reactive to light.  Cardiovascular:     Rate and Rhythm: Normal rate and regular rhythm.     Heart sounds: No murmur heard.  No friction rub. No gallop.   Pulmonary:     Effort: Pulmonary effort is normal.     Breath sounds: No wheezing or rales.  Abdominal:     General: There is no distension.     Palpations: Abdomen is soft.     Tenderness: There is no abdominal tenderness.  Musculoskeletal:        General: No tenderness.     Cervical back: Normal range of motion and neck supple.  Skin:    General: Skin is warm and dry.  Neurological:     Mental Status: Kristen [name] is alert and oriented to person, place, and time.  Psychiatric:        Behavior: Behavior normal.     ED Results / Procedures / Treatments   Labs (all labs ordered are listed, but only abnormal results are displayed) Labs Reviewed  BASIC METABOLIC PANEL - Abnormal;  Notable for the following components:      Result Value   Glucose, Bld 131 (*)    All other components within normal limits  CBC - Abnormal; Notable for the following components:   WBC 11.7 (*)    Platelets 426 (*)    All other components within normal limits  TROPONIN I (HIGH SENSITIVITY)  TROPONIN I (HIGH SENSITIVITY)    EKG EKG Interpretation  Date/Time:  Sunday September 05 2019 14:59:50 EDT Ventricular Rate:  92 PR Interval:    QRS Duration: 111 QT Interval:  394 QTC Calculation: 488 R Axis:   -40 Text Interpretation: Sinus rhythm S1,S2,S3 pattern Low voltage, precordial leads Borderline prolonged QT interval Baseline wander in lead(s) V1 No significant change since last tracing Confirmed by Deno Etienne [phone]) on 09/05/2019 3:07:51 PM   Radiology DG Chest 2 View  Result Date: 09/05/2019 CLINICAL DATA:  Tachycardia, palpitations EXAM: CHEST - 2 VIEW COMPARISON:  07/10/2018 FINDINGS: Lungs are clear. No pneumothorax or pleural effusion. Cardiomediastinal silhouette unremarkable. Cervical fusion hardware is partially visualized. No acute bone abnormality. IMPRESSION: No active cardiopulmonary disease. Electronically Signed   By: Fidela Salisbury MD   On: 09/05/2019 15:35    Procedures Procedures (including critical care time)  Medications Ordered in ED Medications - No data to display  ED Course  I have reviewed the triage vital signs and the nursing notes.  Pertinent labs & imaging results  that were available during my care of the patient were reviewed by me and considered in my medical decision making (see chart for details).    MDM Rules/Calculators/A&P                          49 yo F with a chief complaints of palpitations and fatigue.  This is been an ongoing issue.  Slowly worsening tachycardia measured at home by blood pressure cuff.  Was in the 120s today at urgent care.  Had a bolus of IV fluids with resolution.  Arrived here with sinus tachycardia with a rate of  103.  Improved with rest in the room down to the 80s.  EKG without concerning findings.  Troponin negative.  Lab work without significant anemia or electrolyte abnormality.  I discussed the results with the patient.  Suggested that based on her history of a significant diarrheal illness over the past couple weeks that likely Kristen [name] was dehydrated.  Seems less likely to be a PE or MI.  I feel that a single troponin is sufficient as Kristen [name] is not having chest pain or shortness of breath in the past 6 hours.  We will have her call her doctor tomorrow.  4:35 PM:  I have discussed the diagnosis/risks/treatment options with the patient and family and believe the pt to be eligible for discharge home to follow-up with PCP. We also discussed returning to the ED immediately if new or worsening sx occur. We discussed the sx which are most concerning (e.g.,  fever, inability to tolerate by mouth, chest pain, sob) that necessitate immediate return. Medications administered to the patient during their visit and any new prescriptions provided to the patient are listed below.  Medications given during this visit Medications - No data to display   The patient appears reasonably screen and/or stabilized for discharge and I doubt any other medical condition or other [facility] requiring further screening, evaluation, or treatment in the ED at this time prior to discharge.   Final Clinical Impression(s) / ED Diagnoses Final diagnoses:  Sinus tachycardia    Rx / DC Orders ED Discharge Orders    None       Deno Etienne, DO 09/05/19 1635

## 2019-09-06 ENCOUNTER — Other Ambulatory Visit: Payer: Self-pay

## 2019-09-06 DIAGNOSIS — R748 Abnormal levels of other serum enzymes: Secondary | ICD-10-CM

## 2019-09-06 NOTE — Telephone Encounter (Signed)
Spoke with pt. Pt is aware that she will need labs completed next month, when pt feels better. Pt asked if lab orders could be mailed to her today and she will have completed when she is better. Lab orders placed and mailed.

## 2019-09-09 ENCOUNTER — Encounter: Payer: Self-pay | Admitting: Physician Assistant

## 2019-09-09 ENCOUNTER — Other Ambulatory Visit: Payer: Self-pay | Admitting: Physician Assistant

## 2019-09-09 ENCOUNTER — Telehealth (INDEPENDENT_AMBULATORY_CARE_PROVIDER_SITE_OTHER): Payer: 59 | Admitting: Physician Assistant

## 2019-09-09 DIAGNOSIS — Z634 Disappearance and death of family member: Secondary | ICD-10-CM | POA: Diagnosis not present

## 2019-09-09 DIAGNOSIS — F319 Bipolar disorder, unspecified: Secondary | ICD-10-CM

## 2019-09-09 DIAGNOSIS — G2581 Restless legs syndrome: Secondary | ICD-10-CM | POA: Diagnosis not present

## 2019-09-09 DIAGNOSIS — F411 Generalized anxiety disorder: Secondary | ICD-10-CM

## 2019-09-09 MED ORDER — QUETIAPINE FUMARATE ER 400 MG PO TB24: 1000.0000 mg | ORAL_TABLET | Freq: Every day | ORAL | 5 refills | Status: DC

## 2019-09-09 MED ORDER — MODAFINIL 200 MG PO TABS: 200.0000 mg | ORAL_TABLET | Freq: Every day | ORAL | 5 refills | Status: DC

## 2019-09-09 NOTE — Progress Notes (Signed)
[facility]  Patient ID: BRYONY KAMAN,  MRN: [id number]  PCP: Manon Hilding, MD  Date of Evaluation: 09/09/2019 Time spent:30 minutes  Chief Complaint:  Chief Complaint    Follow-up     Virtual Visit via Telehealth  I connected with patient by a video enabled telemedicine application with their informed consent, and verified patient privacy and that I am speaking with the correct person using two identifiers.  I am private, in my office and the patient is at home.  I discussed the limitations, risks, security and privacy concerns of performing an evaluation and management service by video and the availability of in person appointments. I also discussed with the patient that there may be a patient responsible charge related to this service. The patient expressed understanding and agreed to proceed.   I discussed the assessment and treatment plan with the patient. The patient was provided an opportunity to ask questions and all were answered. The patient agreed with the plan and demonstrated an understanding of the instructions.   The patient was advised to call back or seek an in-person evaluation if the symptoms worsen or if the condition fails to improve as anticipated.  I provided 30 minutes of non-face-to-face time during this encounter.   HISTORY/CURRENT STATUS: HPI For routine med check.  Since LOV, she had Giardia.  Had severe diarrhea for 9 days and lost 13 #.  During that time, she had tachycardia.Had full workup w/ labs and EKG were all ok.  Was started on Metoprolol and feels better physically than she has in a long time. But feels like she's on a spiral downward mentally.  Is still depressed and grieving from the death of her Mom this Spring. We increased the Seroquel to 1200 mg and that made her too much of a zombie.  We decreased back to 800 mg but it's not helping. She gets really aggravated with things, is worried about her husband who has just been  diagnosed with Sarcoidosis and is on Prednisone. "It's one thing after another." Anxiety is still a problem "as you can imagine," but mostly controlled with Xanax.   Unable to enjoy things.  Doesn't want to go anywhere. Energy and motivation are low. Cries easily. Appetite has increased now that she's over the Giardia and she's very frustrated with that. Sleeps good with the Sonata.  No SI/HI.  Patient denies increased energy with decreased need for sleep, no increased talkativeness, no racing thoughts, no impulsivity or risky behaviors, no increased spending, no increased libido, no grandiosity, no increased irritability or anger, no paranoia, and no hallucinations.  Denies dizziness, syncope, seizures, numbness, tingling, tremor, tics, unsteady gait, slurred speech, confusion. Denies muscle or joint pain, stiffness, or dystonia.  Individual Medical History/ Review of Systems: Changes? :No    Past medications for mental health diagnoses include: Prozac, Paxil, Lexapro, Celexa, Zoloft, Viibryd, Effexor, Cymbalta, Risperdal, Latuda, Lamictal, VPA, Lithium-became toxic,Xanax, Wellbutrin, Abilify, Willette [place], Strattera, Kapvay, Saphris, Adderall, Vyvanse, Lunesta, Sonata, CBZ, Vraylar, Seroquel, Geodon, Tegretal  Allergies: Iohexol  Current Medications:  Current Outpatient Medications:  .  acetaminophen (TYLENOL) 325 MG tablet, Take 2 tablets (650 mg total) by mouth every 6 (six) hours as needed for mild pain (or Fever >/= 101)., Disp: 12 tablet, Rfl: 1 .  ALPRAZolam (XANAX) 1 MG tablet, Take 1 tablet (1 mg total) by mouth 3 (three) times daily as needed. for anxiety (Patient taking differently: Take 1 mg by mouth 3 (three) times daily. for anxiety), Disp: 90  tablet, Rfl: 5 .  Azelastine HCl 137 MCG/SPRAY SOLN, SMARTSIG:1-2 Spray(s) Both Nares Twice Daily PRN, Disp: , Rfl:  .  buPROPion (WELLBUTRIN XL) 150 MG 24 hr tablet, TAKE 3 TABLETS BY MOUTH IN THE MORNING, Disp: 90 tablet, Rfl: 5 .  cetirizine  (ZYRTEC) 10 MG tablet, Take 10 mg by mouth daily. , Disp: , Rfl:  .  fluticasone (FLONASE) 50 MCG/ACT nasal spray, Place 2 sprays into both nostrils daily as needed for allergies or rhinitis., Disp: , Rfl:  .  lamoTRIgine (LAMICTAL) 200 MG tablet, Take 2.5 tablets (500 mg total) by mouth daily., Disp: 75 tablet, Rfl: 5 .  levocetirizine (XYZAL) 5 MG tablet, Take 5 mg by mouth every evening., Disp: , Rfl:  .  levothyroxine (SYNTHROID) 75 MCG tablet, Take 75 mcg by mouth daily before breakfast. , Disp: , Rfl:  .  linaclotide (LINZESS) 290 MCG CAPS capsule, Take 1 capsule (290 mcg total) by mouth daily before breakfast., Disp: 30 capsule, Rfl: 5 .  metoprolol succinate (TOPROL-XL) 25 MG 24 hr tablet, Take 25 mg by mouth daily., Disp: , Rfl:  .  modafinil (PROVIGIL) 200 MG tablet, Take 1 tablet (200 mg total) by mouth daily., Disp: 30 tablet, Rfl: 5 .  montelukast (SINGULAIR) 10 MG tablet, TAKE 1 TABLET BY MOUTH ONCE DAILY, Disp: , Rfl:  .  ondansetron (ZOFRAN) 4 MG tablet, TAKE 1 TABLET BY MOUTH EVERY 6 HOURS AS NEEDED FOR NAUSEA OR VOMITING, Disp: 30 tablet, Rfl: 3 .  pantoprazole (PROTONIX) 40 MG tablet, Take 1 tablet (40 mg total) by mouth 2 (two) times daily with a meal., Disp: 180 tablet, Rfl: 3 .  polyethylene glycol (MIRALAX / GLYCOLAX) 17 g packet, Take 17 g by mouth 2 (two) times daily. (Patient taking differently: Take 17 g by mouth daily as needed. ), Disp: 60 each, Rfl: 1 .  pramipexole (MIRAPEX) 1 MG tablet, Take 1 tablet (1 mg total) by mouth 2 (two) times daily., Disp: 60 tablet, Rfl: 5 .  Pseudoephedrine-Guaifenesin (MUCINEX D MAX STRENGTH) [phone] MG TB12, Take by mouth 2 (two) times daily. , Disp: , Rfl:  .  QUEtiapine (SEROQUEL XR) 400 MG 24 hr tablet, Take 3 tablets (1,200 mg total) by mouth at bedtime. Take 2.5 pills qhs, Disp: 75 tablet, Rfl: 5 .  trimethoprim (TRIMPEX) 100 MG tablet, Take 100 mg by mouth daily., Disp: , Rfl:  .  zaleplon (SONATA) 10 MG capsule, Take 1 qhs prn and  may repeat 1 for midnocturnal awakening prn if she has 3 hours left to sleep, Disp: 60 capsule, Rfl: 5 Medication Side Effects: none  Family Medical/ Social History: Changes? Husband has Sarcoidosis.  MENTAL HEALTH EXAM:  There were no vitals taken for this visit.There is no height or weight on file to calculate BMI.  General Appearance: Casual and Well Groomed  Eye Contact:  Good  Speech:  Clear and Coherent and Normal Rate  Volume:  Normal  Mood:  Anxious and Depressed  Affect:  Depressed  Thought Process:  Goal Directed and Descriptions of Associations: Intact  Orientation:  Full (Time, Place, and Person)  Thought Content: Logical   Suicidal Thoughts:  No  Homicidal Thoughts:  No  Memory:  WNL  Judgement:  Good  Insight:  Good  Psychomotor Activity:  Normal  Concentration:  Concentration: Good and Attention Span: Good  Recall:  Good  Fund of Knowledge: Good  Language: Good  Assets:  Desire for Improvement  ADL's:  Intact  Cognition: WNL  Prognosis:  Good     DIAGNOSES:    ICD-10-CM   1. Bipolar disorder with depression ([place])  F31.9   2. Bereavement  Z63.4   3. Generalized anxiety disorder  F41.1   4. Restless legs  G25.81     Receiving Psychotherapy: Yes  Felix Ahmadi   RECOMMENDATIONS:  PDMP was reviewed. I provided 30 minutes of non face to face time during this encounter. Continue modafinil 200 mg, 1 p.o. daily. Increase Seroquel XR 400 mg, back to 2.5 pills nightly.  We discussed the fact that this is not a usual way to dose this because it is a long-acting medication but she has used it before this way and it's been effective. Continue pramipexole 1 mg, 1 p.o. twice daily. Continue Xanax 1 mg, 1 p.o. 3 times daily as needed anxiety.  For now, if she needs a fourth one during the day that is okay due to the grief as well as her husband's pending diagnosis.  If she needs it filled before next scheduled refill is due, that is fine. Continue Wellbutrin XL 150  mg, 3 p.o. every morning. Continue Lamictal 200 mg, 2.5 pills daily. Continue Sonata 10 mg, 1 p.o. nightly as needed and may repeat 1 as needed for mid nocturnal awakening as long as she has 3 hours left to sleep. Continue counseling. Return in 4 weeks.  [name], PA-C

## 2019-09-10 ENCOUNTER — Encounter: Payer: Self-pay | Admitting: Physician Assistant

## 2019-09-15 ENCOUNTER — Telehealth: Payer: Self-pay | Admitting: Physician Assistant

## 2019-09-15 NOTE — Telephone Encounter (Signed)
We discussed this at her last OV, last week, I think. Have her call [facility] ([facility] tomorrow, I think IOP would  be helpful, and continue the grief counseling as well.  If she is suicidal now, go to the behavioral health urgent care, tonight.

## 2019-09-15 NOTE — Telephone Encounter (Signed)
She has reported a struggle since her Mom passed away in all her phone calls, not sure there is any changes that can be made other then continuing with her group support.

## 2019-09-15 NOTE — Telephone Encounter (Signed)
Patient's mom passed away in May and she isn't dealing with it well. She's in a dark place but doesn't want to hurt herself. She just wanted to make someone aware of it. She is going to a grief support group.

## 2019-09-16 NOTE — Telephone Encounter (Signed)
LM to call back to discuss.

## 2019-09-21 ENCOUNTER — Other Ambulatory Visit: Payer: Self-pay

## 2019-09-21 ENCOUNTER — Ambulatory Visit (INDEPENDENT_AMBULATORY_CARE_PROVIDER_SITE_OTHER): Payer: 59 | Admitting: Physician Assistant

## 2019-09-21 ENCOUNTER — Encounter: Payer: Self-pay | Admitting: Physician Assistant

## 2019-09-21 DIAGNOSIS — D224 Melanocytic nevi of scalp and neck: Secondary | ICD-10-CM

## 2019-09-21 DIAGNOSIS — Z86006 Personal history of melanoma in-situ: Secondary | ICD-10-CM

## 2019-09-21 DIAGNOSIS — L7211 Pilar cyst: Secondary | ICD-10-CM | POA: Diagnosis not present

## 2019-09-21 DIAGNOSIS — D485 Neoplasm of uncertain behavior of skin: Secondary | ICD-10-CM

## 2019-09-21 NOTE — Progress Notes (Signed)
Follow-Up Visit   Subjective  Kristen [name] is a 49 y.o. female who presents for the following: Cyst (SCALP X2 PT AWARE ONLY 1 MAY BE DONE TODAY).   The following portions of the chart were reviewed this encounter and updated as appropriate: Tobacco  Allergies  Meds  Problems  Med Hx  Surg Hx  Fam Hx      Objective  Well appearing patient in no apparent distress; mood and affect are within normal limits.  A focused examination was performed including scalp, chest. Relevant physical exam findings are noted in the Assessment and Plan.  Objective  Mid Parietal Scalp post: Skin toned nodule     Objective  Mid Parietal Scalp ant: Skin toned nodule     Objective  Left Breast: Scar clear   Assessment & Plan  Neoplasm of uncertain behavior of skin (2) Mid Parietal Scalp post  Skin excision  Lesion length (cm):  1.2 Lesion width (cm):  1.2 Margin per side (cm):  0.1 Total excision diameter (cm):  1.4 Informed consent: discussed and consent obtained   Timeout: patient name, date of birth, surgical site, and procedure verified   Anesthesia: the lesion was anesthetized in a standard fashion   Anesthetic:  1% lidocaine w/ epinephrine 1-100,000 local infiltration Instrument used: #15 blade   Hemostasis achieved with: pressure and electrodesiccation   Outcome: patient tolerated procedure well with no complications   Post-procedure details: sterile dressing applied and wound care instructions given   Dressing type: bandage, petrolatum and pressure dressing    Skin repair Complexity:  Simple Final length (cm):  1.4 Informed consent: discussed and consent obtained   Timeout: patient name, date of birth, surgical site, and procedure verified   Procedure prep:  Patient was prepped and draped in usual sterile fashion (non sterile) Prep type:  Chlorhexidine Anesthesia: the lesion was anesthetized in a standard fashion   Undermining: edges undermined     Fine/surface layer approximation (top stitches):  Suture size:  4-0 Suture type: Prolene (polypropylene)   Stitches: simple interrupted   Suture removal (days):  7 Hemostasis achieved with: suture, pressure and electrodesiccation Outcome: patient tolerated procedure well with no complications   Post-procedure details: wound care instructions given   Post-procedure details comment:  Non sterile pressure   Specimen 1 - Surgical pathology Differential Diagnosis: pilar cyst Check Margins: No  Mid Parietal Scalp ant  Skin excision  Lesion length (cm):  1 Lesion width (cm):  1 Margin per side (cm):  0.1 Total excision diameter (cm):  1.2 Informed consent: discussed and consent obtained   Timeout: patient name, date of birth, surgical site, and procedure verified   Anesthesia: the lesion was anesthetized in a standard fashion   Anesthetic:  1% lidocaine w/ epinephrine 1-100,000 local infiltration Instrument used: #15 blade   Hemostasis achieved with: pressure and electrodesiccation   Outcome: patient tolerated procedure well with no complications   Post-procedure details: sterile dressing applied and wound care instructions given   Dressing type: bandage, petrolatum and pressure dressing    Skin repair Complexity:  Simple Final length (cm):  1.2 Informed consent: discussed and consent obtained   Timeout: patient name, date of birth, surgical site, and procedure verified   Procedure prep:  Patient was prepped and draped in usual sterile fashion (non sterile) Prep type:  Chlorhexidine Anesthesia: the lesion was anesthetized in a standard fashion   Undermining: edges undermined   Fine/surface layer approximation (top stitches):  Suture size:  4-0 Suture  type: Vicryl Rapide (coated polyglactin 910) and Prolene (polypropylene)   Suture type comment:  Nylon Stitches: simple interrupted and simple running   Suture removal (days):  7 Hemostasis achieved with: suture, pressure and  electrodesiccation Outcome: patient tolerated procedure well with no complications   Post-procedure details: wound care instructions given   Post-procedure details comment:  Non sterile pressure   Specimen 2 - Surgical pathology Differential Diagnosis: pilar cyst Check Margins: No  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  The documentation on 09/21/19 for the exam, diagnosis, procedures and orders are all accurate and complete.

## 2019-09-21 NOTE — Patient Instructions (Signed)

## 2019-09-24 ENCOUNTER — Telehealth: Payer: Self-pay | Admitting: Physician Assistant

## 2019-09-24 NOTE — Telephone Encounter (Signed)
5:30 PM I called patient back after reviewing her chart.  She has been on several different SSRIs in the past including Prozac.  It has been such a long time she does not remember its effect.  It is fine for her to add that into her other psychiatric medications and I really hope it is effective.  She will go ahead and start it tomorrow morning.  If effective, I will be prescribing that from now on. Dr. Quintin Alto did draw some labs, vitamin D, thyroid, and CBC, that is what she can remember.  Results are pending.

## 2019-09-24 NOTE — Telephone Encounter (Signed)
Pt called stating she has done nothing this week but sleep. Only change in meds is Seroquel from 2/d to 2-1/2/d. Please advise @ [phone]. Apt 9/1

## 2019-09-24 NOTE — Telephone Encounter (Signed)
All week, she's been sleeping of an on all day.  Also sleeps all night.  We increased the Seroquel a few weeks ago up to 1000 mg.  The only other change that has occurred recently is her PCP put her on metoprolol because she was having tachycardia.  It is only 25 mg daily, but last week it was increased to 25 mg, 2/day.  She was having tachycardia more in the daytime so that is why it was increased.  However she only took 50 mg for 2 days and then decreased it back. Since her mom passed away several months ago, she has had trouble sleeping and has had a lot of anxiety and depression.  I kind of think that she is just so mentally and physically exhausted that her body is finally giving in and able to rest.  I would recommend she continue the Seroquel at 1000 mg nightly for about 5 more days.  If she is still struggling with somnolence, then go back down to 800 mg, on 09/28/2019.  She verbalizes understanding. Call if further problems.

## 2019-09-24 NOTE — Telephone Encounter (Signed)
Kristen [name] just left her PCP to see if her sleepiness was medical.  He did a work up and some labs and told her she is just severely depressed.  He prescribed her prozac 20mg .  BUT he told her not to fill it until you said it was ok to take with her other medications and that he would like for you to take over prescribing this medication if you agree for her to take it.  Please call Kristen [name] to let her know if the Prozac is ok

## 2019-09-27 NOTE — Telephone Encounter (Signed)
Pt called this am to report she took first dose of Prozac 20 mg Saturday and slept 4 hrs hard sleep. Very disoriented when she woke. Has not taken any since. Please advise  [phone]

## 2019-09-27 NOTE — Telephone Encounter (Signed)
Ok. Thanks!

## 2019-09-27 NOTE — Telephone Encounter (Signed)
What time of day did she take it?  I doubt that the medication is causing that because it usually does not cause somnolence or at least not to that degree.  If she took it in the morning, have her switch to nighttime.  I recommend she stay on it for at least a week and if the somnolence does not improve, I will change to a different antidepressant.

## 2019-09-27 NOTE — Telephone Encounter (Signed)
Spoke with patient and she did take Prozac in the morning as discussed. She also touched base with her PCP and they recommend she change to hs as well. She will try that tonight.  Her PCP should also be faxing over labs.. verbally she gave me TSH 1.660 And Vitamin D 26... she does take OTC Vitamin D 50 mcg daily and has been for over a year.

## 2019-09-29 NOTE — Addendum Note (Signed)
Addended by: Robyne Askew R on: 09/29/2019 11:12 AM   Modules accepted: Level of Service

## 2019-10-01 LAB — HEPATIC FUNCTION PANEL
AG Ratio: 2 (calc) (ref 1.0–2.5)
ALT: 22 U/L (ref 6–29)
AST: 17 U/L (ref 10–35)
Albumin: 4.7 g/dL (ref 3.6–5.1)
Alkaline phosphatase (APISO): 135 U/L — ABNORMAL HIGH (ref 31–125)
Bilirubin, Direct: 0.1 mg/dL (ref 0.0–0.2)
Globulin: 2.3 g/dL (calc) (ref 1.9–3.7)
Indirect Bilirubin: 0.4 mg/dL (calc) (ref 0.2–1.2)
Total Bilirubin: 0.5 mg/dL (ref 0.2–1.2)
Total Protein: 7 g/dL (ref 6.1–8.1)

## 2019-10-01 LAB — GAMMA GT: GGT: 96 U/L — ABNORMAL HIGH (ref 3–55)

## 2019-10-04 ENCOUNTER — Telehealth: Payer: Self-pay | Admitting: Internal Medicine

## 2019-10-04 DIAGNOSIS — R748 Abnormal levels of other serum enzymes: Secondary | ICD-10-CM

## 2019-10-04 NOTE — Telephone Encounter (Signed)
Patient called wanting to know about her lab results    Please call

## 2019-10-04 NOTE — Telephone Encounter (Signed)
Routing to [employer]

## 2019-10-05 ENCOUNTER — Telehealth: Payer: Self-pay | Admitting: Internal Medicine

## 2019-10-05 NOTE — Telephone Encounter (Signed)
Sent message via mychart (attached to labs) tonight. Please let pt know. She will need ov in 3 months with lfts prior to ov.  See result note

## 2019-10-05 NOTE — Telephone Encounter (Signed)
Pt called again today to follow up on her lab results. She said they were in Mychart and left a message yesterday. I told her the nurse took the message at 3pm yesterday and forwarded it to the provider and we were waiting for that to be addressed and the nurse would contact her back. [phone]

## 2019-10-05 NOTE — Telephone Encounter (Signed)
Noted. Message was sent to provider yesterday. Will call pt with results when they have resulted.

## 2019-10-06 ENCOUNTER — Encounter: Payer: Self-pay | Admitting: Internal Medicine

## 2019-10-08 NOTE — Telephone Encounter (Signed)
OV made °

## 2019-10-08 NOTE — Telephone Encounter (Signed)
Pt has seen [employer] in [place] and Elmo Putt is trying to reach the pt by phone.  Elmo Putt, I have put in the future orders for this pt.  Beckie Salts, please schedule ov in 3 months.

## 2019-10-08 NOTE — Addendum Note (Signed)
Addended by: Claudina Lick on: 10/08/2019 09:43 AM   Modules accepted: Orders

## 2019-10-08 NOTE — Telephone Encounter (Signed)
Noted  

## 2019-10-13 ENCOUNTER — Telehealth (INDEPENDENT_AMBULATORY_CARE_PROVIDER_SITE_OTHER): Payer: 59 | Admitting: Physician Assistant

## 2019-10-13 ENCOUNTER — Encounter: Payer: Self-pay | Admitting: Physician Assistant

## 2019-10-13 DIAGNOSIS — F319 Bipolar disorder, unspecified: Secondary | ICD-10-CM | POA: Diagnosis not present

## 2019-10-13 DIAGNOSIS — F5105 Insomnia due to other mental disorder: Secondary | ICD-10-CM

## 2019-10-13 DIAGNOSIS — F99 Mental disorder, not otherwise specified: Secondary | ICD-10-CM

## 2019-10-13 DIAGNOSIS — F411 Generalized anxiety disorder: Secondary | ICD-10-CM | POA: Diagnosis not present

## 2019-10-13 DIAGNOSIS — Z634 Disappearance and death of family member: Secondary | ICD-10-CM | POA: Diagnosis not present

## 2019-10-13 MED ORDER — QUETIAPINE FUMARATE ER 400 MG PO TB24: 1000.0000 mg | ORAL_TABLET | Freq: Every day | ORAL | 5 refills | Status: DC

## 2019-10-13 NOTE — Progress Notes (Signed)
[facility]  Patient ID: Kristen [name],  MRN: [id number]  PCP: Manon Hilding, MD  Date of Evaluation: 10/13/2019 Time spent:20 minutes  Chief Complaint:  Chief Complaint    Depression; Anxiety; Insomnia     Virtual Visit via Telehealth  I connected with patient by a video enabled telemedicine application, with their informed consent, and verified patient privacy and that I am speaking with the correct person using two identifiers.  I am private, in my office and the patient is at home work.  I discussed the limitations, risks, security and privacy concerns of performing an evaluation and management service by video and the availability of in person appointments. I also discussed with the patient that there may be a patient responsible charge related to this service. The patient expressed understanding and agreed to proceed.   I discussed the assessment and treatment plan with the patient. The patient was provided an opportunity to ask questions and all were answered. The patient agreed with the plan and demonstrated an understanding of the instructions.   The patient was advised to call back or seek an in-person evaluation if the symptoms worsen or if the condition fails to improve as anticipated.  I provided 20 minutes of non-face-to-face time during this encounter.   HISTORY/CURRENT STATUS: HPI For routine med check.  Her PCP put her on Prozac since our last visit. For the most part, "I'm doing pretty good. The Prozac has helped."  She's in grief counseling and seeing her personal counselor. She started a new Bible study at church last night. She and her husband took a weekend trip to [facility] and she really relaxed and had a good time.    Still very sad after losing her mom in May. But feels more able to enjoy life some. Energy and motivation are better.  Not isolating.  Not crying as much as she did.  The Prozac has helped a lot! Anxiety is well controlled with the  Xanax.  Sleeps pretty good. No suicidal or homicidal thoughts.  Patient denies increased energy with decreased need for sleep, no increased talkativeness, no racing thoughts, no impulsivity or risky behaviors, no increased spending, no increased libido, no grandiosity, no increased irritability or anger, no paranoia, and no hallucinations.  Denies dizziness, syncope, seizures, numbness, tingling, tremor, tics, unsteady gait, slurred speech, confusion. Denies muscle or joint pain, stiffness, or dystonia.  Individual Medical History/ Review of Systems: Changes? :Yes   still has elevated LFTs and GI is following her. Has an appt with [facility] 10/25/2019   Past medications for mental health diagnoses include: Prozac, Paxil, Lexapro, Celexa, Zoloft, Viibryd, Effexor, Cymbalta, Risperdal, Latuda, Lamictal, VPA, Lithium-became toxic,Xanax, Wellbutrin, Abilify, Willette [place], Strattera, Kapvay, Saphris, Adderall, Vyvanse, Lunesta, Sonata, CBZ, Vraylar, Seroquel, Geodon, Tegretal  Allergies: Iohexol  Current Medications:  Current Outpatient Medications:  .  acetaminophen (TYLENOL) 325 MG tablet, Take 2 tablets (650 mg total) by mouth every 6 (six) hours as needed for mild pain (or Fever >/= 101)., Disp: 12 tablet, Rfl: 1 .  ALPRAZolam (XANAX) 1 MG tablet, Take 1 tablet (1 mg total) by mouth 3 (three) times daily as needed. for anxiety (Patient taking differently: Take 1 mg by mouth 3 (three) times daily. for anxiety), Disp: 90 tablet, Rfl: 5 .  Azelastine HCl 137 MCG/SPRAY SOLN, SMARTSIG:1-2 Spray(s) Both Nares Twice Daily PRN, Disp: , Rfl:  .  buPROPion (WELLBUTRIN XL) 150 MG 24 hr tablet, TAKE 3 TABLETS BY MOUTH IN  THE MORNING, Disp: 90 tablet, Rfl: 5 .  cetirizine (ZYRTEC) 10 MG tablet, Take 10 mg by mouth daily. , Disp: , Rfl:  .  FLUoxetine (PROZAC) 20 MG capsule, Take 20 mg by mouth daily., Disp: , Rfl:  .  fluticasone (FLONASE) 50 MCG/ACT nasal spray, Place 2 sprays into both  nostrils daily as needed for allergies or rhinitis., Disp: , Rfl:  .  lamoTRIgine (LAMICTAL) 200 MG tablet, Take 2.5 tablets (500 mg total) by mouth daily., Disp: 75 tablet, Rfl: 5 .  levocetirizine (XYZAL) 5 MG tablet, Take 5 mg by mouth every evening., Disp: , Rfl:  .  levothyroxine (SYNTHROID) 75 MCG tablet, Take 75 mcg by mouth daily before breakfast. , Disp: , Rfl:  .  linaclotide (LINZESS) 290 MCG CAPS capsule, Take 1 capsule (290 mcg total) by mouth daily before breakfast., Disp: 30 capsule, Rfl: 5 .  metoprolol succinate (TOPROL-XL) 25 MG 24 hr tablet, Take 25 mg by mouth daily., Disp: , Rfl:  .  modafinil (PROVIGIL) 200 MG tablet, Take 1 tablet (200 mg total) by mouth daily., Disp: 30 tablet, Rfl: 5 .  montelukast (SINGULAIR) 10 MG tablet, TAKE 1 TABLET BY MOUTH ONCE DAILY, Disp: , Rfl:  .  ondansetron (ZOFRAN) 4 MG tablet, TAKE 1 TABLET BY MOUTH EVERY 6 HOURS AS NEEDED FOR NAUSEA OR VOMITING, Disp: 30 tablet, Rfl: 3 .  pantoprazole (PROTONIX) 40 MG tablet, Take 1 tablet (40 mg total) by mouth 2 (two) times daily with a meal., Disp: 180 tablet, Rfl: 3 .  polyethylene glycol (MIRALAX / GLYCOLAX) 17 g packet, Take 17 g by mouth 2 (two) times daily. (Patient taking differently: Take 17 g by mouth daily as needed. ), Disp: 60 each, Rfl: 1 .  pramipexole (MIRAPEX) 1 MG tablet, Take 1 tablet (1 mg total) by mouth 2 (two) times daily., Disp: 60 tablet, Rfl: 5 .  Pseudoephedrine-Guaifenesin (MUCINEX D MAX STRENGTH) [phone] MG TB12, Take by mouth 2 (two) times daily. , Disp: , Rfl:  .  QUEtiapine (SEROQUEL XR) 400 MG 24 hr tablet, Take 3 tablets (1,200 mg total) by mouth at bedtime. Take 2.5 pills qhs, Disp: 75 tablet, Rfl: 5 .  trimethoprim (TRIMPEX) 100 MG tablet, Take 100 mg by mouth daily., Disp: , Rfl:  .  zaleplon (SONATA) 10 MG capsule, Take 1 qhs prn and may repeat 1 for midnocturnal awakening prn if she has 3 hours left to sleep, Disp: 60 capsule, Rfl: 5 Medication Side Effects:  none  Family Medical/ Social History: Changes? no  MENTAL HEALTH EXAM:  There were no vitals taken for this visit.There is no height or weight on file to calculate BMI.  General Appearance: Casual and Well Groomed  Eye Contact:  Good  Speech:  Clear and Coherent and Normal Rate  Volume:  Normal  Mood:  Euthymic  Affect:  Appropriate  Thought Process:  Goal Directed and Descriptions of Associations: Intact  Orientation:  Full (Time, Place, and Person)  Thought Content: Logical   Suicidal Thoughts:  No  Homicidal Thoughts:  No  Memory:  WNL  Judgement:  Good  Insight:  Good  Psychomotor Activity:  Normal  Concentration:  Concentration: Good and Attention Span: Good  Recall:  Good  Fund of Knowledge: Good  Language: Good  Assets:  Desire for Improvement  ADL's:  Intact  Cognition: WNL  Prognosis:  Good     DIAGNOSES:    ICD-10-CM   1. Bipolar disorder with depression ([place])  F31.9  2. Generalized anxiety disorder  F41.1   3. Bereavement  Z63.4   4. Insomnia due to other mental disorder  F51.05    F99     Receiving Psychotherapy: Yes  Felix Ahmadi   RECOMMENDATIONS:  PDMP was reviewed. I provided 20 minutes of non face to face time during this encounter. I'm glad she's feeling better and that her PCP started the Prozac. Cont Prozac 20 mg qd. Continue modafinil 200 mg, 1 p.o. daily. Cont Seroquel XR 400 mg, 2.5 pills qhs.  Continue pramipexole 1 mg, 1 p.o. twice daily. Continue Xanax 1 mg, 1 p.o. 3 times daily as needed anxiety.   Continue Wellbutrin XL 150 mg, 3 p.o. every morning. Continue Lamictal 200 mg, 2.5 pills daily. Continue Sonata 10 mg, 1 p.o. nightly as needed and may repeat 1 as needed for mid nocturnal awakening as long as she has 3 hours left to sleep. Continue counseling. Return in 4 weeks.  [name], PA-C

## 2019-10-19 ENCOUNTER — Telehealth: Payer: Self-pay | Admitting: Internal Medicine

## 2019-10-19 NOTE — Telephone Encounter (Signed)
Lmom, waiting on a return call.  

## 2019-10-19 NOTE — Telephone Encounter (Signed)
It would be reasonable for her to start with her PCP as there is some exacerbation of her pain (right rib/breast area) with coughing and no N/V or abdominal pain.

## 2019-10-19 NOTE — Telephone Encounter (Signed)
Spoke with pt. Pt was notified of recommendations.

## 2019-10-19 NOTE — Telephone Encounter (Signed)
Spoke with pt. Pt started having dull pain under her rt breast/rib cage on Sunday evening. This morning pain is sharp and hurts under rt breast. Pt reports no nausea, no vomiting, no abdominal pian. Pt isn't sure if she should see her PCP or our office. Pt states the pain is sharp and she's concerned. Pts pain level is a 8 when coughing, when sitting, pain is at a 5. Pt states that she does a lot of coughing all throughout the day every day x years. Pt is on allergy med (#3). Pt also takes Pantoprazole twice daily. Pt is going to also call her PCP office to see what can be done.

## 2019-10-19 NOTE — Telephone Encounter (Signed)
Pt said she started having side pain yesterday and she googled her symptoms and it said it could be related to liver issues. She said since she was being treated for liver issues she didn't know if she needed to see her PCP or come see [place]. She is aware of her OV in NOV. Please advise. [phone]

## 2019-10-20 ENCOUNTER — Ambulatory Visit: Payer: 59 | Admitting: Gastroenterology

## 2019-10-25 ENCOUNTER — Telehealth: Payer: Self-pay | Admitting: Physician Assistant

## 2019-10-25 ENCOUNTER — Ambulatory Visit (INDEPENDENT_AMBULATORY_CARE_PROVIDER_SITE_OTHER): Payer: 59 | Admitting: Family Medicine

## 2019-10-25 ENCOUNTER — Other Ambulatory Visit: Payer: Self-pay

## 2019-10-25 ENCOUNTER — Encounter (INDEPENDENT_AMBULATORY_CARE_PROVIDER_SITE_OTHER): Payer: Self-pay | Admitting: Family Medicine

## 2019-10-25 VITALS — BP 97/67 | HR 87 | Temp 97.8°F | Ht 66.0 in | Wt 241.0 lb

## 2019-10-25 DIAGNOSIS — Z6839 Body mass index (BMI) 39.0-39.9, adult: Secondary | ICD-10-CM | POA: Diagnosis not present

## 2019-10-25 DIAGNOSIS — E1165 Type 2 diabetes mellitus with hyperglycemia: Secondary | ICD-10-CM

## 2019-10-25 DIAGNOSIS — R0602 Shortness of breath: Secondary | ICD-10-CM

## 2019-10-25 DIAGNOSIS — R5383 Other fatigue: Secondary | ICD-10-CM | POA: Diagnosis not present

## 2019-10-25 DIAGNOSIS — Z1331 Encounter for screening for depression: Secondary | ICD-10-CM

## 2019-10-25 DIAGNOSIS — F319 Bipolar disorder, unspecified: Secondary | ICD-10-CM

## 2019-10-25 DIAGNOSIS — Z0289 Encounter for other administrative examinations: Secondary | ICD-10-CM

## 2019-10-25 DIAGNOSIS — E559 Vitamin D deficiency, unspecified: Secondary | ICD-10-CM

## 2019-10-25 DIAGNOSIS — E039 Hypothyroidism, unspecified: Secondary | ICD-10-CM

## 2019-10-25 NOTE — Telephone Encounter (Signed)
Noted  

## 2019-10-25 NOTE — Telephone Encounter (Signed)
Kristen [name] let pt know to increase Prozac to 40 mg.  If I need to send in Rx let me know, but she should have 20 mgs she can double. Cont Xanax as directed.

## 2019-10-25 NOTE — Telephone Encounter (Signed)
Pt called to report she is having major anxiety attacks. Taking 3 Xanax daily and not helping. Contact @ [phone].  Apt 10/6

## 2019-10-25 NOTE — Telephone Encounter (Signed)
Pt. Made aware. She just got a refill so she does not need an Rx sent at this time.

## 2019-10-25 NOTE — Telephone Encounter (Signed)
Please review

## 2019-10-26 ENCOUNTER — Telehealth: Payer: Self-pay | Admitting: Internal Medicine

## 2019-10-26 LAB — COMPREHENSIVE METABOLIC PANEL
ALT: 27 IU/L (ref 0–32)
AST: 21 IU/L (ref 0–40)
Albumin/Globulin Ratio: 2.3 — ABNORMAL HIGH (ref 1.2–2.2)
Albumin: 4.8 g/dL (ref 3.8–4.8)
Alkaline Phosphatase: 166 IU/L — ABNORMAL HIGH (ref 44–121)
BUN/Creatinine Ratio: 20 (ref 9–23)
BUN: 17 mg/dL (ref 6–24)
Bilirubin Total: 0.3 mg/dL (ref 0.0–1.2)
CO2: 25 mmol/L (ref 20–29)
Calcium: 9.2 mg/dL (ref 8.7–10.2)
Chloride: 101 mmol/L (ref 96–106)
Creatinine, Ser: 0.86 mg/dL (ref 0.57–1.00)
GFR calc Af Amer: 92 mL/min/{1.73_m2} (ref >59)
GFR calc non Af Amer: 80 mL/min/{1.73_m2} (ref >59)
Globulin, Total: 2.1 g/dL (ref 1.5–4.5)
Glucose: 87 mg/dL (ref 65–99)
Potassium: 3.8 mmol/L (ref 3.5–5.2)
Sodium: 141 mmol/L (ref 134–144)
Total Protein: 6.9 g/dL (ref 6.0–8.5)

## 2019-10-26 LAB — CBC WITH DIFFERENTIAL/PLATELET
Basophils Absolute: 0.1 10*3/uL (ref 0.0–0.2)
Basos: 1 %
EOS (ABSOLUTE): 0.4 10*3/uL (ref 0.0–0.4)
Eos: 3 %
Hematocrit: 39.5 % (ref 34.0–46.6)
Hemoglobin: 12.9 g/dL (ref 11.1–15.9)
Immature Grans (Abs): 0.4 10*3/uL — ABNORMAL HIGH (ref 0.0–0.1)
Immature Granulocytes: 3 %
Lymphocytes Absolute: 1.7 10*3/uL (ref 0.7–3.1)
Lymphs: 12 %
MCH: 28.9 pg (ref 26.6–33.0)
MCHC: 32.7 g/dL (ref 31.5–35.7)
MCV: 88 fL (ref 79–97)
Monocytes Absolute: 0.6 10*3/uL (ref 0.1–0.9)
Monocytes: 4 %
Neutrophils Absolute: 10.9 10*3/uL — ABNORMAL HIGH (ref 1.4–7.0)
Neutrophils: 77 %
Platelets: 364 10*3/uL (ref 150–450)
RBC: 4.47 x10E6/uL (ref 3.77–5.28)
RDW: 14.5 % (ref 11.7–15.4)
WBC: 14.2 10*3/uL — ABNORMAL HIGH (ref 3.4–10.8)

## 2019-10-26 LAB — LIPID PANEL WITH LDL/HDL RATIO
Cholesterol, Total: 235 mg/dL — ABNORMAL HIGH (ref 100–199)
HDL: 48 mg/dL (ref >39)
LDL Chol Calc (NIH): 117 mg/dL — ABNORMAL HIGH (ref 0–99)
LDL/HDL Ratio: 2.4 ratio (ref 0.0–3.2)
Triglycerides: 403 mg/dL — ABNORMAL HIGH (ref 0–149)
VLDL Cholesterol Cal: 70 mg/dL — ABNORMAL HIGH (ref 5–40)

## 2019-10-26 LAB — T3: T3, Total: 80 ng/dL (ref 71–180)

## 2019-10-26 LAB — HEMOGLOBIN A1C
Est. average glucose Bld gHb Est-mCnc: 111 mg/dL
Hgb A1c MFr Bld: 5.5 % (ref 4.8–5.6)

## 2019-10-26 LAB — INSULIN, RANDOM: INSULIN: 29.7 u[IU]/mL — ABNORMAL HIGH (ref 2.6–24.9)

## 2019-10-26 LAB — T4: T4, Total: 4.6 ug/dL (ref 4.5–12.0)

## 2019-10-26 LAB — VITAMIN D 25 HYDROXY (VIT D DEFICIENCY, FRACTURES): Vit D, 25-Hydroxy: 30.1 ng/mL (ref 30.0–100.0)

## 2019-10-26 NOTE — Telephone Encounter (Signed)
ok 

## 2019-10-26 NOTE — Telephone Encounter (Signed)
Pt said she had labs done within the [facility] system recently and wanted Neil Crouch, PA to review them.

## 2019-10-26 NOTE — Telephone Encounter (Signed)
Noted. Routing to Neil Crouch, PA to review labs in epic per pts request.

## 2019-10-28 ENCOUNTER — Telehealth: Payer: Self-pay | Admitting: Physician Assistant

## 2019-10-28 ENCOUNTER — Other Ambulatory Visit: Payer: Self-pay | Admitting: Physician Assistant

## 2019-10-28 NOTE — Telephone Encounter (Signed)
Ezzard Standing or Debbie I think would be a good fit for her.

## 2019-10-28 NOTE — Telephone Encounter (Signed)
Kristen [name] wants to do CBT therapy with someone here. Right now, she has [employer] insurance. She does have Medicare but it is Part A only. We file to [facility] so that does not limit who she can see. Who do you recommend?

## 2019-10-29 NOTE — Telephone Encounter (Signed)
review 

## 2019-11-01 NOTE — Progress Notes (Signed)
Chief Complaint:   OBESITY Kristen [name] (MR# [id number]) is a 49 y.o. female who presents for evaluation and treatment of obesity and related comorbidities. Current BMI is Body mass index is 38.9 kg/m. Kristen [name] has been struggling with her weight for many years and has been unsuccessful in either losing weight, maintaining weight loss, or reaching her healthy weight goal.  Kristen [name] is currently in the action stage of change and ready to dedicate time achieving and maintaining a healthier weight. Kristen [name] is interested in becoming our patient and working on intensive lifestyle modifications including (but not limited to) diet and exercise for weight loss.  Kristen [name]'s habits were reviewed today and are as follows: Her family eats meals together, she thinks her family will eat healthier with her, her desired weight loss is 91 lbs, she has been heavy most of her life, she started gaining weight in May 2020, her heaviest weight ever was 248.5 pounds, she is a picky eater and doesn't like to eat healthier foods, she has significant food cravings issues, she snacks frequently in the evenings, she skips meals frequently, she frequently makes poor food choices, she has problems with excessive hunger, she frequently eats larger portions than normal, she has binge eating behaviors and she struggles with emotional eating.  Depression Screen Kristen [name]'s Food and Mood (modified PHQ-9) score was 25.  Depression screen PHQ 2/9 10/25/2019  Decreased Interest 3  Down, Depressed, Hopeless 3  PHQ - 2 Score 6  Altered sleeping 2  Tired, decreased energy 3  Change in appetite 3  Feeling bad or failure about yourself  3  Trouble concentrating 2  Moving slowly or fidgety/restless 3  Suicidal thoughts 3  PHQ-9 Score 25  Difficult doing work/chores Extremely dIfficult   Subjective:   1. Other fatigue Kristen [name] admits to daytime somnolence and admits to waking up still tired. Patent has a history of symptoms  of daytime fatigue. Kristen [name] generally gets 4 or 6 hours of sleep per night, and states that she has nightime awakenings. Snoring is present. Apneic episodes are not present. Epworth Sleepiness Score is 16.   2. SOB (shortness of breath) on exertion Kristen [name] notes increasing shortness of breath with exercising and seems to be worsening over time with weight gain. She notes getting out of breath sooner with activity than she used to. This has not gotten worse recently. Kristen [name] denies shortness of breath at rest or orthopnea.  3. Type 2 diabetes mellitus with hyperglycemia, without long-term current use of insulin (HCC) Kristen [name] has a historical diagnosis. No A1c on Epic from recent lab draws.  4. Bipolar I disorder ([place]) Kristen [name] sees Kristen [name], [place] for management. She notes depression. She is on lamictal, Prozac, seroquel, and Wellbutrin.   5. Vitamin D deficiency Kristen [name] denies nausea, vomiting, or muscle weakness, but she notes fatigue. She is on Vit D50 mcg.  6. Hypothyroidism, unspecified type Kristen [name] was diagnosed years ago. She is on Synthroid 75 mcg. Last TSH was 3.150 in July 2021.  Assessment/Plan:   1. Other fatigue Kristen [name] does feel that her weight is causing her energy to be lower than it should be. Fatigue may be related to obesity, depression or many other causes. Labs will be ordered, and in the meanwhile, Kristen [name] will focus on self care including making healthy food choices, increasing physical activity and focusing on stress reduction.  - EKG 12-Lead - Comprehensive metabolic panel  2. SOB (shortness of breath) on exertion Kristen [name] does feel that she gets  out of breath more easily that she used to when she exercises. Kristen [name]'s shortness of breath appears to be obesity related and exercise induced. She has agreed to work on weight loss and gradually increase exercise to treat her exercise induced shortness of breath. Will continue to monitor closely.  - CBC with  Differential/Platelet - Lipid Panel With LDL/HDL Ratio  3. Type 2 diabetes mellitus with hyperglycemia, without long-term current use of insulin (HCC) Good blood sugar control is important to decrease the likelihood of diabetic complications such as nephropathy, neuropathy, limb loss, blindness, coronary artery disease, and death. Intensive lifestyle modification including diet, exercise and weight loss are the first line of treatment for diabetes. We will check labs today, and Kristen [name] will follow up as directed.  - Hemoglobin A1c - Insulin, random  4. Bipolar I disorder ([place]) Kristen [name] will continue to follow up with Kristen Moat, PA as directed.  5. Vitamin D deficiency Low Vitamin D level contributes to fatigue and are associated with obesity, breast, and colon cancer. We will check labs today. Kristen [name] will follow-up for routine testing of Vitamin D, at least 2-3 times per year to avoid over-replacement.  - VITAMIN D 25 Hydroxy (Vit-D Deficiency, Fractures)  6. Hypothyroidism, unspecified type Patient with long-standing hypothyroidism, on levothyroxine therapy. Kristen [name] appears euthyroid. We will check labs today. Orders and follow up as documented in patient record.  Counseling . Good thyroid control is important for overall health. Supratherapeutic thyroid levels are dangerous and will not improve weight loss results. . The correct way to take levothyroxine is fasting, with water, separated by at least 30 minutes from breakfast, and separated by more than 4 hours from calcium, iron, multivitamins, acid reflux medications (PPIs).   - T3 - T4  7. Depression screening Kristen [name] had a positive depression screening. Depression is commonly associated with obesity and often results in emotional eating behaviors. We will monitor this closely and work on CBT to help improve the non-hunger eating patterns. Referral to Psychology may be required if no improvement is seen as she continues in our  clinic.  8. Class 2 severe obesity with serious comorbidity and body mass index (BMI) of 39.0 to 39.9 in adult, unspecified obesity type ([place]) Lukisha is currently in the action stage of change and her goal is to continue with weight loss efforts. I recommend Kinda begin the structured treatment plan as follows:  She has agreed to the Category 3 Plan.  Exercise goals: No exercise has been prescribed at this time.   Behavioral modification strategies: increasing lean protein intake, meal planning and cooking strategies, keeping healthy foods in the home and planning for success.  She was informed of the importance of frequent follow-up visits to maximize her success with intensive lifestyle modifications for her multiple health conditions. She was informed we would discuss her lab results at her next visit unless there is a critical issue that needs to be addressed sooner. Aleysia agreed to keep her next visit at the agreed upon time to discuss these results.  Objective:   Blood pressure 97/67, pulse 87, temperature 97.8 F (36.6 C), temperature source Oral, height 5\' 6"  (1.676 m), weight 241 lb (109.3 kg), SpO2 96 %. Body mass index is 38.9 kg/m.  EKG: Normal sinus rhythm, rate 83 BPM.  Indirect Calorimeter completed today shows a VO2 of 255 and a REE of 1775.  Her calculated basal metabolic rate is 2778 thus her basal metabolic rate is worse than expected.  General: Cooperative, alert, well  developed, in no acute distress. HEENT: Conjunctivae and lids unremarkable. Cardiovascular: Regular rhythm.  Lungs: Normal work of breathing. Neurologic: No focal deficits.   Lab Results  Component Value Date   CREATININE 0.86 10/25/2019   BUN 17 10/25/2019   NA 141 10/25/2019   K 3.8 10/25/2019   CL 101 10/25/2019   CO2 25 10/25/2019   Lab Results  Component Value Date   ALT 27 10/25/2019   AST 21 10/25/2019   ALKPHOS 166 (H) 10/25/2019   BILITOT 0.3 10/25/2019   Lab Results   Component Value Date   HGBA1C 5.5 10/25/2019   HGBA1C  05/04/2008    4.7 (NOTE)   The ADA recommends the following therapeutic goal for glycemic   control related to Hgb A1C measurement:   Goal of Therapy:   < 7.0% Hgb A1C   Reference: American Diabetes Association: Clinical Practice   Recommendations 2008, Diabetes Care,  2008, 31:(Suppl 1).   Lab Results  Component Value Date   INSULIN 29.7 (H) 10/25/2019   Lab Results  Component Value Date   TSH 2.324  05/04/2008   Lab Results  Component Value Date   CHOL 235 (H) 10/25/2019   HDL 48 10/25/2019   LDLCALC 117 (H) 10/25/2019   TRIG 403 (H) 10/25/2019   CHOLHDL 4.9 05/04/2008   Lab Results  Component Value Date   WBC 14.2 (H) 10/25/2019   HGB 12.9 10/25/2019   HCT 39.5 10/25/2019   MCV 88 10/25/2019   PLT 364 10/25/2019   Lab Results  Component Value Date   IRON 14 (L) 07/10/2018   TIBC 252 07/10/2018   FERRITIN 289 07/10/2018   Obesity Behavioral Intervention:   Approximately 15 minutes were spent on the discussion below.  ASK: We discussed the diagnosis of obesity with [place] today and Roosevelt agreed to give [place] permission to discuss obesity behavioral modification therapy today.  ASSESS: Perina has the diagnosis of obesity and her BMI today is 38.92. Syncere is in the action stage of change.   ADVISE: Kenedi was educated on the multiple health risks of obesity as well as the benefit of weight loss to improve her health. She was advised of the need for long term treatment and the importance of lifestyle modifications to improve her current health and to decrease her risk of future health problems.  AGREE: Multiple dietary modification options and treatment options were discussed and Nadie agreed to follow the recommendations documented in the above note.  ARRANGE: Crosby was educated on the importance of frequent visits to treat obesity as outlined per CMS and USPSTF guidelines and agreed to schedule her  next follow up appointment today.  Attestation Statements:   Reviewed by clinician on day of visit: allergies, medications, problem list, medical history, surgical history, family history, social history, and previous encounter notes.   I, Trixie Dredge, am acting as transcriptionist for Coralie Common, MD.  This is the patient's first visit at [facility]. The patient's NEW PATIENT PACKET was reviewed at length. Included in the packet: current and past health history, medications, allergies, ROS, gynecologic history (women only), surgical history, family history, social history, weight history, weight loss surgery history (for those that have had weight loss surgery), nutritional evaluation, mood and food questionnaire, PHQ9, Epworth questionnaire, sleep habits questionnaire, patient life and health improvement goals questionnaire. These will all be scanned into the patient's chart under media.   During the visit, I independently reviewed the patient's EKG, bioimpedance scale results, and  indirect calorimeter results. I used this information to tailor a meal plan for the patient that will help her to lose weight and will improve her obesity-related conditions going forward. I performed a medically necessary appropriate examination and/or evaluation. I discussed the assessment and treatment plan with the patient. The patient was provided an opportunity to ask questions and all were answered. The patient agreed with the plan and demonstrated an understanding of the instructions. Labs were ordered at this visit and will be reviewed at the next visit unless more critical results need to be addressed immediately. Clinical information was updated and documented in the EMR.   Time spent on visit including pre-visit chart review and post-visit care was 45 minutes.   A separate 15 minutes was spent on risk counseling (see above).

## 2019-11-02 NOTE — Telephone Encounter (Signed)
I spoke with patient about results and she verbalized understanding and had no questions 

## 2019-11-02 NOTE — Telephone Encounter (Signed)
Labs reviewed. LFTs stable. Keep upcoming ov as scheduled.

## 2019-11-08 ENCOUNTER — Other Ambulatory Visit: Payer: Self-pay

## 2019-11-08 ENCOUNTER — Ambulatory Visit (INDEPENDENT_AMBULATORY_CARE_PROVIDER_SITE_OTHER): Payer: 59 | Admitting: Family Medicine

## 2019-11-08 ENCOUNTER — Encounter (INDEPENDENT_AMBULATORY_CARE_PROVIDER_SITE_OTHER): Payer: Self-pay | Admitting: Family Medicine

## 2019-11-08 VITALS — BP 106/61 | HR 79 | Temp 98.1°F | Ht 66.0 in | Wt 244.0 lb

## 2019-11-08 DIAGNOSIS — F339 Major depressive disorder, recurrent, unspecified: Secondary | ICD-10-CM

## 2019-11-08 DIAGNOSIS — Z6839 Body mass index (BMI) 39.0-39.9, adult: Secondary | ICD-10-CM

## 2019-11-08 DIAGNOSIS — E7849 Other hyperlipidemia: Secondary | ICD-10-CM | POA: Diagnosis not present

## 2019-11-08 DIAGNOSIS — E8881 Metabolic syndrome: Secondary | ICD-10-CM

## 2019-11-08 DIAGNOSIS — E559 Vitamin D deficiency, unspecified: Secondary | ICD-10-CM | POA: Diagnosis not present

## 2019-11-08 DIAGNOSIS — Z9189 Other specified personal risk factors, not elsewhere classified: Secondary | ICD-10-CM

## 2019-11-08 MED ORDER — VITAMIN D (ERGOCALCIFEROL) 1.25 MG (50000 UNIT) PO CAPS: 50000.0000 [IU] | ORAL_CAPSULE | ORAL | 0 refills | Status: DC
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
## 2019-11-10 ENCOUNTER — Encounter (INDEPENDENT_AMBULATORY_CARE_PROVIDER_SITE_OTHER): Payer: Self-pay | Admitting: Family Medicine

## 2019-11-11 ENCOUNTER — Encounter (INDEPENDENT_AMBULATORY_CARE_PROVIDER_SITE_OTHER): Payer: Self-pay | Admitting: Family Medicine

## 2019-11-11 NOTE — Telephone Encounter (Signed)
Please advise 

## 2019-11-12 ENCOUNTER — Encounter (INDEPENDENT_AMBULATORY_CARE_PROVIDER_SITE_OTHER): Payer: Self-pay | Admitting: Family Medicine

## 2019-11-15 ENCOUNTER — Encounter: Payer: Self-pay | Admitting: *Deleted

## 2019-11-15 ENCOUNTER — Other Ambulatory Visit: Payer: Self-pay | Admitting: *Deleted

## 2019-11-15 ENCOUNTER — Telehealth (INDEPENDENT_AMBULATORY_CARE_PROVIDER_SITE_OTHER): Payer: Self-pay

## 2019-11-15 ENCOUNTER — Encounter (INDEPENDENT_AMBULATORY_CARE_PROVIDER_SITE_OTHER): Payer: 59 | Admitting: Family Medicine

## 2019-11-15 DIAGNOSIS — R748 Abnormal levels of other serum enzymes: Secondary | ICD-10-CM

## 2019-11-15 NOTE — Telephone Encounter (Signed)
11/15/19 Left a message for Nilda Riggs to return call regarding appt for pt-[facility]

## 2019-11-15 NOTE — Telephone Encounter (Signed)
Please advise 

## 2019-11-17 ENCOUNTER — Telehealth (INDEPENDENT_AMBULATORY_CARE_PROVIDER_SITE_OTHER): Payer: 59 | Admitting: Physician Assistant

## 2019-11-17 ENCOUNTER — Encounter: Payer: Self-pay | Admitting: Physician Assistant

## 2019-11-17 ENCOUNTER — Encounter (INDEPENDENT_AMBULATORY_CARE_PROVIDER_SITE_OTHER): Payer: Self-pay | Admitting: Family Medicine

## 2019-11-17 DIAGNOSIS — F319 Bipolar disorder, unspecified: Secondary | ICD-10-CM

## 2019-11-17 DIAGNOSIS — E559 Vitamin D deficiency, unspecified: Secondary | ICD-10-CM

## 2019-11-17 DIAGNOSIS — F5105 Insomnia due to other mental disorder: Secondary | ICD-10-CM

## 2019-11-17 DIAGNOSIS — G2581 Restless legs syndrome: Secondary | ICD-10-CM

## 2019-11-17 DIAGNOSIS — F411 Generalized anxiety disorder: Secondary | ICD-10-CM | POA: Diagnosis not present

## 2019-11-17 DIAGNOSIS — F99 Mental disorder, not otherwise specified: Secondary | ICD-10-CM

## 2019-11-17 MED ORDER — SERTRALINE HCL 100 MG PO TABS: 100.0000 mg | ORAL_TABLET | Freq: Every day | ORAL | 1 refills | Status: DC

## 2019-11-17 NOTE — Telephone Encounter (Signed)
Please review
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
## 2019-11-23 NOTE — Telephone Encounter (Signed)
Please advise 

## 2019-11-24 ENCOUNTER — Encounter (INDEPENDENT_AMBULATORY_CARE_PROVIDER_SITE_OTHER): Payer: Self-pay | Admitting: Family Medicine

## 2019-11-24 ENCOUNTER — Ambulatory Visit (INDEPENDENT_AMBULATORY_CARE_PROVIDER_SITE_OTHER): Payer: 59 | Admitting: Family Medicine

## 2019-11-24 ENCOUNTER — Other Ambulatory Visit: Payer: Self-pay

## 2019-11-24 VITALS — BP 112/72 | HR 82 | Temp 97.7°F | Ht 66.0 in | Wt 248.0 lb

## 2019-11-24 DIAGNOSIS — Z9189 Other specified personal risk factors, not elsewhere classified: Secondary | ICD-10-CM

## 2019-11-24 DIAGNOSIS — E1165 Type 2 diabetes mellitus with hyperglycemia: Secondary | ICD-10-CM | POA: Diagnosis not present

## 2019-11-24 DIAGNOSIS — E7849 Other hyperlipidemia: Secondary | ICD-10-CM

## 2019-11-24 DIAGNOSIS — Z6841 Body Mass Index (BMI) 40.0 and over, adult: Secondary | ICD-10-CM | POA: Diagnosis not present

## 2019-11-24 MED ORDER — METFORMIN HCL 500 MG PO TABS: 500.0000 mg | ORAL_TABLET | Freq: Two times a day (BID) | ORAL | 0 refills | Status: DC

## 2019-11-29 NOTE — Progress Notes (Signed)
Chief Complaint:   OBESITY Kristen [name] is here to discuss her progress with her obesity treatment plan along with follow-up of her obesity related diagnoses. Kristen [name] is on the Category 3 Plan and states she is following her eating plan approximately 70% of the time. Kristen [name] states she is walking for 14 minutes 1 times per week.  Today's visit was #: 3 Starting weight: 241 lbs Starting date: 10/25/2019 Today's weight: 248 lbs Today's date: 11/24/2019 Total lbs lost to date: 0 Total lbs lost since last in-office visit: 0  Interim History: Kristen [name] voices that she has done some binge eating in the past few weeks.  She did not fast today.  She finds herself binging when home along and late at night at least 3 times per week.  For breakfast, she sticks to the yogurt option.  She has been skipping lunch.  Dinner is grilled protein and vegetable, but she is weighing protein.  Subjective:   1. Type 2 diabetes mellitus with hyperglycemia, without long-term current use of insulin (HCC) She is not on medication, but really struggling with sweets cravings and indulgent eating.  Lab Results  Component Value Date   HGBA1C 5.5 10/25/2019   HGBA1C  05/04/2008    4.7 (NOTE)   The ADA recommends the following therapeutic goal for glycemic   control related to Hgb A1C measurement:   Goal of Therapy:   < 7.0% Hgb A1C   Reference: American Diabetes Association: Clinical Practice   Recommendations 2008, Diabetes Care,  2008, 31:(Suppl 1).   Lab Results  Component Value Date   LDLCALC 117 (H) 10/25/2019   CREATININE 0.86 10/25/2019   Lab Results  Component Value Date   INSULIN 29.7 (H) 10/25/2019   2. Other hyperlipidemia Kristen [name] has hyperlipidemia and has been trying to improve her cholesterol levels with intensive lifestyle modification including a low saturated fat diet, exercise and weight loss. She denies any chest pain, claudication or myalgias.  LDL 117, HDL 48, triglycerides 403 on  10/25/2019.  Previously, triglycerides not as elevated.  Lab Results  Component Value Date   ALT 27 10/25/2019   AST 21 10/25/2019   ALKPHOS 166 (H) 10/25/2019   BILITOT 0.3 10/25/2019   Lab Results  Component Value Date   CHOL 235 (H) 10/25/2019   HDL 48 10/25/2019   LDLCALC 117 (H) 10/25/2019   TRIG 403 (H) 10/25/2019   CHOLHDL 4.9 05/04/2008   3. At risk for deficient intake of food The patient is at a higher than average risk of deficient intake of food due to skipping lunch daily.  Assessment/Plan:   1. Type 2 diabetes mellitus with hyperglycemia, without long-term current use of insulin (HCC) Good blood sugar control is important to decrease the likelihood of diabetic complications such as nephropathy, neuropathy, limb loss, blindness, coronary artery disease, and death. Intensive lifestyle modification including diet, exercise and weight loss are the first line of treatment for diabetes.  Start metformin, as per below.  -Start metFORMIN (GLUCOPHAGE) 500 MG tablet; Take 1 tablet (500 mg total) by mouth 2 (two) times daily with a meal.  Dispense: 60 tablet; Refill: 0  2. Other hyperlipidemia Cardiovascular risk and specific lipid/LDL goals reviewed.  We discussed several lifestyle modifications today and Kristen [name] will continue to work on diet, exercise and weight loss efforts. Orders and follow up as documented in patient record.  Repeat FLP in early November with PCP.  Counseling Intensive lifestyle modifications are the first line treatment for this issue. Marland Kitchen  Dietary changes: Increase soluble fiber. Decrease simple carbohydrates. . Exercise changes: Moderate to vigorous-intensity aerobic activity 150 minutes per week if tolerated. . Lipid-lowering medications: see documented in medical record.  3. At risk for deficient intake of food Kristen [name] was given approximately 15 minutes of deficit intake of food prevention counseling today. Kristen [name] is at risk for eating too few calories  based on current food recall. She was encouraged to focus on meeting caloric and protein goals according to her recommended meal plan.   4. Class 3 severe obesity with serious comorbidity and body mass index (BMI) of 40.0 to 44.9 in adult, unspecified obesity type ([place])  Kristen [name] is currently in the action stage of change. As such, her goal is to continue with weight loss efforts. She has agreed to the Category 3 Plan.   Exercise goals: No exercise has been prescribed at this time.  Behavioral modification strategies: increasing lean protein intake, no skipping meals and emotional eating strategies.  Kristen [name] has agreed to follow-up with our clinic in 2-3 weeks. She was informed of the importance of frequent follow-up visits to maximize her success with intensive lifestyle modifications for her multiple health conditions.   Objective:   Blood pressure 112/72, pulse 82, temperature 97.7 F (36.5 C), temperature source Oral, height 5\' 6"  (1.676 m), weight 248 lb (112.5 kg), SpO2 96 %. Body mass index is 40.03 kg/m.  General: Cooperative, alert, well developed, in no acute distress. HEENT: Conjunctivae and lids unremarkable. Cardiovascular: Regular rhythm.  Lungs: Normal work of breathing. Neurologic: No focal deficits.   Lab Results  Component Value Date   CREATININE 0.86 10/25/2019   BUN 17 10/25/2019   NA 141 10/25/2019   K 3.8 10/25/2019   CL 101 10/25/2019   CO2 25 10/25/2019   Lab Results  Component Value Date   ALT 27 10/25/2019   AST 21 10/25/2019   ALKPHOS 166 (H) 10/25/2019   BILITOT 0.3 10/25/2019   Lab Results  Component Value Date   HGBA1C 5.5 10/25/2019   HGBA1C  05/04/2008    4.7 (NOTE)   The ADA recommends the following therapeutic goal for glycemic   control related to Hgb A1C measurement:   Goal of Therapy:   < 7.0% Hgb A1C   Reference: American Diabetes Association: Clinical Practice   Recommendations 2008, Diabetes Care,  2008, 31:(Suppl 1).   Lab  Results  Component Value Date   INSULIN 29.7 (H) 10/25/2019    Lab Results  Component Value Date   CHOL 235 (H) 10/25/2019   HDL 48 10/25/2019   LDLCALC 117 (H) 10/25/2019   TRIG 403 (H) 10/25/2019   CHOLHDL 4.9 05/04/2008   Lab Results  Component Value Date   WBC 14.2 (H) 10/25/2019   HGB 12.9 10/25/2019   HCT 39.5 10/25/2019   MCV 88 10/25/2019   PLT 364 10/25/2019   Lab Results  Component Value Date   IRON 14 (L) 07/10/2018   TIBC 252 07/10/2018   FERRITIN 289 07/10/2018   Attestation Statements:   Reviewed by clinician on day of visit: allergies, medications, problem list, medical history, surgical history, family history, social history, and previous encounter notes.  I, Water quality scientist, CMA, am acting as transcriptionist for Coralie Common, MD.  I have reviewed the above documentation for accuracy and completeness, and I agree with the above. - Jinny Blossom, MD

## 2019-12-03 ENCOUNTER — Ambulatory Visit: Payer: 59 | Admitting: Cardiovascular Disease

## 2019-12-05 ENCOUNTER — Telehealth: Payer: Self-pay | Admitting: Physician Assistant

## 2019-12-05 NOTE — Telephone Encounter (Signed)
Please let her know I've reviewed labs and her Trig have more than doubled in 2 weeks, from 10/25/19 to 11/11/19. I know we discussed that at the last visit, but I wanted to f/u and see if Dr. Quintin Alto or the Dr at [facility] is repeating that lab and/or treating the problem. The Seroquel can cause abnl lipids/trig and glucose. If that remains a problem, we may need to wean off Seroquel and use a mood stabilizer, which I don't think will be as effective. But we'll switch if we have to.

## 2019-12-07 ENCOUNTER — Other Ambulatory Visit: Payer: Self-pay | Admitting: Physician Assistant

## 2019-12-07 NOTE — Telephone Encounter (Signed)
Rtc to patient and discussed message, she has actually had a few things change. They are planning to recheck her labs again in 2 weeks, she's been started on Metformin. Diagnosed with Insulin Resistant pre diabetic. She also is putting the [facility] on hold for now.

## 2019-12-07 NOTE — Telephone Encounter (Signed)
reviewed

## 2019-12-08 ENCOUNTER — Ambulatory Visit (INDEPENDENT_AMBULATORY_CARE_PROVIDER_SITE_OTHER): Payer: Medicare Other | Admitting: Physician Assistant

## 2019-12-08 ENCOUNTER — Other Ambulatory Visit: Payer: Self-pay | Admitting: Gastroenterology

## 2019-12-08 NOTE — Telephone Encounter (Signed)
Last apt 11/17/19

## 2019-12-09 ENCOUNTER — Other Ambulatory Visit: Payer: Self-pay

## 2019-12-13 ENCOUNTER — Ambulatory Visit: Payer: 59 | Admitting: Cardiovascular Disease

## 2019-12-14 ENCOUNTER — Encounter (INDEPENDENT_AMBULATORY_CARE_PROVIDER_SITE_OTHER): Payer: Self-pay | Admitting: Physician Assistant

## 2019-12-14 ENCOUNTER — Other Ambulatory Visit: Payer: Self-pay

## 2019-12-14 ENCOUNTER — Ambulatory Visit (INDEPENDENT_AMBULATORY_CARE_PROVIDER_SITE_OTHER): Payer: 59 | Admitting: Physician Assistant

## 2019-12-14 VITALS — BP 93/63 | HR 83 | Temp 97.7°F | Ht 66.0 in | Wt 246.0 lb

## 2019-12-14 DIAGNOSIS — Z9189 Other specified personal risk factors, not elsewhere classified: Secondary | ICD-10-CM

## 2019-12-14 DIAGNOSIS — E1169 Type 2 diabetes mellitus with other specified complication: Secondary | ICD-10-CM | POA: Diagnosis not present

## 2019-12-14 DIAGNOSIS — E559 Vitamin D deficiency, unspecified: Secondary | ICD-10-CM | POA: Diagnosis not present

## 2019-12-14 DIAGNOSIS — Z6839 Body mass index (BMI) 39.0-39.9, adult: Secondary | ICD-10-CM

## 2019-12-14 DIAGNOSIS — E785 Hyperlipidemia, unspecified: Secondary | ICD-10-CM

## 2019-12-14 MED ORDER — VITAMIN D (ERGOCALCIFEROL) 1.25 MG (50000 UNIT) PO CAPS: 50000.0000 [IU] | ORAL_CAPSULE | ORAL | 0 refills | Status: DC

## 2019-12-14 NOTE — Progress Notes (Signed)
Chief Complaint:   OBESITY Kristen [name] is here to discuss her progress with her obesity treatment plan along with follow-up of her obesity related diagnoses. Kristen [name] is on the Category 3 Plan and states she is following her eating plan approximately 70% of the time. Kristen [name] states she is exercising 0 minutes 0 times per week.  Today's visit was #: 4 Starting weight: 241 lbs Starting date: 10/25/2019 Today's weight: 246 lbs Today's date: 12/14/2019 Total lbs lost to date: 0 Total lbs lost since last in-office visit: 2  Interim History: Kristen [name] states that the last 2 weeks have been stressful and that she is an emotional eater. She also notes binge eating behavior. She is bored with the plan. She does well with breakfast and dinner, but has issues with lunch.  Subjective:   Vitamin D deficiency. Kristen [name] is on prescription Vitamin D. Last level was not at goal.   Ref. Range 10/25/2019 10:59  Vitamin D, 25-Hydroxy Latest Ref Range: 30.0 - 100.0 ng/mL 30.1   Hyperlipidemia associated with type 2 diabetes mellitus (Kristen [name]). Kristen [name] is on metformin BID. No nausea, vomiting, or diarrhea. She reports cravings for sweets.  Lab Results  Component Value Date   CHOL 235 (H) 10/25/2019   HDL 48 10/25/2019   LDLCALC 117 (H) 10/25/2019   TRIG 403 (H) 10/25/2019   CHOLHDL 4.9 05/04/2008   Lab Results  Component Value Date   ALT 27 10/25/2019   AST 21 10/25/2019   ALKPHOS 166 (H) 10/25/2019   BILITOT 0.3 10/25/2019   The 10-year ASCVD risk score Kristen [name] DC Jr., et al., 2013) is: 1.8%   Values used to calculate the score:     Age: 89 years     Sex: Female     Is Non-Hispanic African American: No     Diabetic: Yes     Tobacco smoker: No     Systolic Blood Pressure: 93 mmHg     Is BP treated: No     HDL Cholesterol: 48 mg/dL     Total Cholesterol: 235 mg/dL  Assessment/Plan:   Vitamin D deficiency. Low Vitamin D level contributes to fatigue and are associated with obesity,  breast, and colon cancer. She was given a refill on her Vitamin D, Ergocalciferol, (DRISDOL) 1.25 MG (50000 UNIT) CAPS capsule every week #4 with 0 refills and will follow-up for routine testing of Vitamin D, at least 2-3 times per year to avoid over-replacement.   Hyperlipidemia associated with type 2 diabetes mellitus (Kristen [name]). Cardiovascular risk and specific lipid/LDL goals reviewed.  We discussed several lifestyle modifications today and Kristen [name] will continue to work on diet, exercise and weight loss efforts. Orders and follow up as documented in patient record. She will continue with metformin as directed.   Counseling Intensive lifestyle modifications are the first line treatment for this issue. . Dietary changes: Increase soluble fiber. Decrease simple carbohydrates. . Exercise changes: Moderate to vigorous-intensity aerobic activity 150 minutes per week if tolerated. . Lipid-lowering medications: see documented in medical record.  Class 2 severe obesity with serious comorbidity and body mass index (BMI) of 39.0 to 39.9 in adult, unspecified obesity type (Kristen [name]).  Kristen [name] is currently in the action stage of change. As such, her goal is to continue with weight loss efforts. She has agreed to the Category 3 Plan.   Alternative lunch options were given.  Exercise goals: For substantial health benefits, adults should do at least 150 minutes (2 hours and 30 minutes) a week of  moderate-intensity, or 75 minutes (1 hour and 15 minutes) a week of vigorous-intensity aerobic physical activity, or an equivalent combination of moderate- and vigorous-intensity aerobic activity. Aerobic activity should be performed in episodes of at least 10 minutes, and preferably, it should be spread throughout the week.  Behavioral modification strategies: decreasing simple carbohydrates and meal planning and cooking strategies.  Kristen [name] has agreed to follow-up with our clinic in 2 weeks. She was informed of the  importance of frequent follow-up visits to maximize her success with intensive lifestyle modifications for her multiple health conditions.   Objective:   Blood pressure 93/63, pulse 83, temperature 97.7 F (36.5 C), height 5\' 6"  (1.676 m), weight 246 lb (111.6 kg), SpO2 96 %. Body mass index is 39.71 kg/m.  General: Cooperative, alert, well developed, in no acute distress. HEENT: Conjunctivae and lids unremarkable. Cardiovascular: Regular rhythm.  Lungs: Normal work of breathing. Neurologic: No focal deficits.   Lab Results  Component Value Date   CREATININE 0.86 10/25/2019   BUN 17 10/25/2019   NA 141 10/25/2019   K 3.8 10/25/2019   CL 101 10/25/2019   CO2 25 10/25/2019   Lab Results  Component Value Date   ALT 27 10/25/2019   AST 21 10/25/2019   ALKPHOS 166 (H) 10/25/2019   BILITOT 0.3 10/25/2019   Lab Results  Component Value Date   HGBA1C 5.5 10/25/2019   HGBA1C  05/04/2008    4.7 (NOTE)   The ADA recommends the following therapeutic goal for glycemic   control related to Hgb A1C measurement:   Goal of Therapy:   < 7.0% Hgb A1C   Reference: American Diabetes Association: Clinical Practice   Recommendations 2008, Diabetes Care,  2008, 31:(Suppl 1).   Lab Results  Component Value Date   INSULIN 29.7 (H) 10/25/2019    Lab Results  Component Value Date   CHOL 235 (H) 10/25/2019   HDL 48 10/25/2019   LDLCALC 117 (H) 10/25/2019   TRIG 403 (H) 10/25/2019   CHOLHDL 4.9 05/04/2008   Lab Results  Component Value Date   WBC 14.2 (H) 10/25/2019   HGB 12.9 10/25/2019   HCT 39.5 10/25/2019   MCV 88 10/25/2019   PLT 364 10/25/2019   Lab Results  Component Value Date   IRON 14 (L) 07/10/2018   TIBC 252 07/10/2018   FERRITIN 289 07/10/2018   Obesity Behavioral Intervention:   Approximately 15 minutes were spent on the discussion below.  ASK: We discussed the diagnosis of obesity with Kristen [name] today and Kristen [name] agreed to give [place] permission to discuss obesity  behavioral modification therapy today.  ASSESS: Kristen [name] has the diagnosis of obesity and her BMI today is 39.7. Kristen [name] is in the action stage of change.   ADVISE: Kristen [name] was educated on the multiple health risks of obesity as well as the benefit of weight loss to improve her health. She was advised of the need for long term treatment and the importance of lifestyle modifications to improve her current health and to decrease her risk of future health problems.  AGREE: Multiple dietary modification options and treatment options were discussed and Kristen [name] agreed to follow the recommendations documented in the above note.  ARRANGE: Kristen [name] was educated on the importance of frequent visits to treat obesity as outlined per CMS and USPSTF guidelines and agreed to schedule her next follow up appointment today.  Attestation Statements:   Reviewed by clinician on day of visit: allergies, medications, problem list, medical history, surgical history, family history,  social history, and previous encounter notes.  Kristen [name], am acting as transcriptionist for Kristen Potash, PA-C   I have reviewed the above documentation for accuracy and completeness, and I agree with the above. Kristen Potash, PA-C

## 2019-12-14 NOTE — Addendum Note (Signed)
Addended by: Dewayne Shorter L on: 12/14/2019 10:18 AM   Modules accepted: Orders

## 2019-12-16 NOTE — Progress Notes (Signed)
Cardiology Office Note:    Date:  12/17/2019   ID:  SHI GROSE, DOB [birth year], MRN [id number]  PCP:  Manon Hilding, MD  [place] Cardiologist:  No primary care provider on file.  [facility] Electrophysiologist:  None   Referring MD: Manon Hilding, MD    History of Present Illness:    Kristen [name] is a 49 y.o. female with a hx of anxiety, HLD, obesity, DMII, and HTN who was referred by Dr. Quintin Alto for evaluation of shortness of breath  Patient states she has been having severe shortness of breath that has been getting progressively worse over the past several months. She states that walking from the house to the mailbox to the house she has to stop to catch her breath.  She can only make it 2 stairs without needing to stop and rest.  No chest pressure but gets palpitations. No nausea but dizziness/lightheadedness. No LOC. Has LE edema and was started on lasix; no orthopnea or PND. She snores loudly and noted to have apneic episodes by her husband not currently on CPAP. No known asthma, COPD or smoking. Has not seen pulmonary   In terms of palpitations, she can have them come on or at rest. Has some lightheadedness, dizziness but no syncope. She has been seen at Urgent care for the racing heart rate where she was noted to be in sinus rhythm. At that time, the patient was having a diarrheal illness and improved with IVF.    Also notes LE edema and has recently been started on lasix, which has helped. Has never had echo or cath that she knows of.  Family history: Mother with Afib, AVR, CHF; Father-unknown. No siblings.   LDL 117, HDL 40, TC 312, TG 1036  Past Medical History:  Diagnosis Date  . Allergies   . Anemia   . Anxiety   . Arthritis   . Atypical mole 11/03/2006   mid upper back (slight to moderate)  . Atypical mole 11/03/2006   lower right back (slight to moderate)  . Back pain   . Bipolar 1 disorder ([place])   . Borderline diabetic   . Chronic  constipation   . Constipation   . Depression   . Family history of adverse reaction to anesthesia    mother-- ponv  . Fatty liver   . GAD (generalized anxiety disorder)   . GERD (gastroesophageal reflux disease)   . Hiatal hernia   . High triglycerides   . History of kidney stones   . History of recurrent UTIs   . History of sepsis 06/2018  . History of stomach ulcers   . History of suicidal ideation   . Hyperlipidemia   . Hypothyroidism   . IDA (iron deficiency anemia)   . Joint pain   . Melanoma ([place]) 11/03/2006   left chest in situ (excision)  . Orthostasis   . Palpitations   . PONV (postoperative nausea and vomiting)   . Prediabetes   . Rapid heart rate   . Renal calculus, left   . Seasonal allergies   . Shortness of breath   . Sleep disorder, unspecified    per excessive sleeping during the day  . Snoring   . Swallowing difficulty   . Tachycardia   . Vertigo   . Vitamin D deficiency   . Weakness   . Wears contact lenses     Past Surgical History:  Procedure Laterality Date  . ANTERIOR CERVICAL DECOMP/DISCECTOMY FUSION  02/17/2012   Procedure: ANTERIOR CERVICAL DECOMPRESSION/DISCECTOMY FUSION 2 LEVELS;  Surgeon: Hosie Spangle, MD;  Location: [place] [facility];  Service: Neurosurgery;  Laterality: N/A;  Cervical four-five and Cervical six-seven anterior cervial decompression with fusion plating and bonegraft  . ANTERIOR CERVICAL DECOMP/DISCECTOMY FUSION  04-01-2001   '@[facility]'    C5---6  . BIOPSY  07/20/2018   Procedure: BIOPSY;  Surgeon: Daneil Dolin, MD;  [facility];  Service: Endoscopy;;  gastric   . BREAST REDUCTION SURGERY Bilateral 1995  . CESAREAN SECTION  x2   last one 1998   with BILATERAL TUBAL LIGATION  . CESAREAN SECTION WITH BILATERAL TUBAL LIGATION    . COLONOSCOPY WITH PROPOFOL N/A 07/20/2018   Dr. Gala Romney: Diverticulosis, internal hemorrhoids, 5 mm splenic flexure tubular adenoma removed.  . CYSTOSCOPY/URETEROSCOPY/HOLMIUM LASER/STENT  PLACEMENT Left 08/21/2018   Procedure: CYSTOSCOPY LEFT RETROGRADE PYELOGRAM /URETEROSCOPY/HOLMIUM LASER/STENT PLACEMENT;  Surgeon: Ceasar Mons, MD;  Location: [facility];  Service: Urology;  Laterality: Left;  . DILATION AND CURETTAGE OF UTERUS  1992   for miscarriage  . ENDOMETRIAL ABLATION  2014  . ESOPHAGOGASTRODUODENOSCOPY     20 years ago.   Marland Kitchen ESOPHAGOGASTRODUODENOSCOPY (EGD) WITH PROPOFOL N/A 07/20/2018   Dr. Gala Romney: Small hiatal hernia, erythematous mucosa in the stomach with a healing gastric ulcer measuring 1 cm, biopsies negative.  Marland Kitchen Nord   unilateral for infertility  . NASAL SINUS SURGERY  2010  approx.  Marland Kitchen POLYPECTOMY  07/20/2018   Procedure: POLYPECTOMY;  Surgeon: Daneil Dolin, MD;  [facility];  Service: Endoscopy;;  . POSTERIOR CERVICAL FUSION/FORAMINOTOMY N/A 08/18/2013   Procedure: CERVICAL FOUR TO CERVICAL SEVEN POSTERIOR CERVICAL FUSION/FORAMINOTOMY LEVEL 3;  Surgeon: Hosie Spangle, MD;  Location: [place] [facility];  Service: Neurosurgery;  Laterality: N/A;  C4-7 posterior cervical fusion with lateral mass fixation    Current Medications: Current Meds  Medication Sig  . acetaminophen (TYLENOL) 325 MG tablet Take 2 tablets (650 mg total) by mouth every 6 (six) hours as needed for mild pain (or Fever >/= 101).  Marland Kitchen ALPRAZolam (XANAX) 1 MG tablet TAKE 1 TABLET BY MOUTH THREE TIMES DAILY AS NEEDED FOR ANXIETY  . buPROPion (WELLBUTRIN XL) 150 MG 24 hr tablet TAKE 3 TABLETS BY MOUTH IN THE MORNING  . cetirizine (ZYRTEC) 10 MG tablet Take 10 mg by mouth daily.   . fluticasone (FLONASE) 50 MCG/ACT nasal spray Place 2 sprays into both nostrils daily as needed for allergies or rhinitis.   Marland Kitchen lamoTRIgine (LAMICTAL) 200 MG tablet Take 2.5 tablets (500 mg total) by mouth daily.  Marland Kitchen levocetirizine (XYZAL) 5 MG tablet Take 5 mg by mouth every evening.  Marland Kitchen levothyroxine (SYNTHROID) 75 MCG tablet Take 75 mcg by mouth daily before breakfast.    . LINZESS 290 MCG CAPS capsule TAKE 1 CAPSULE BY MOUTH ONCE DAILY BEFORE BREAKFAST  . meclizine (ANTIVERT) 25 MG tablet Take 25 mg by mouth 3 (three) times daily as needed for dizziness.  . metFORMIN (GLUCOPHAGE) 500 MG tablet Take 1 tablet (500 mg total) by mouth 2 (two) times daily with a meal.  . metoprolol succinate (TOPROL-XL) 25 MG 24 hr tablet Take 25 mg by mouth daily.  . montelukast (SINGULAIR) 10 MG tablet TAKE 1 TABLET BY MOUTH ONCE DAILY  . ondansetron (ZOFRAN) 4 MG tablet TAKE 1 TABLET BY MOUTH EVERY 6 HOURS AS NEEDED FOR NAUSEA OR VOMITING  . pantoprazole (PROTONIX) 40 MG tablet Take 1 tablet (40 mg total)  by mouth 2 (two) times daily with a meal.  . polyethylene glycol (MIRALAX / GLYCOLAX) 17 g packet Take 17 g by mouth 2 (two) times daily. (Patient taking differently: Take 17 g by mouth as needed. )  . pramipexole (MIRAPEX) 1 MG tablet Take 1 tablet (1 mg total) by mouth 2 (two) times daily.  . Pseudoephedrine-Guaifenesin (MUCINEX D MAX STRENGTH) [phone] MG TB12 Take by mouth as needed.   Marland Kitchen QUEtiapine (SEROQUEL XR) 400 MG 24 hr tablet Take 3 tablets (1,200 mg total) by mouth at bedtime. Take 2.5 pills qhs  . sertraline (ZOLOFT) 100 MG tablet Take 1 tablet (100 mg total) by mouth daily.  Marland Kitchen torsemide (DEMADEX) 20 MG tablet Take 20 mg by mouth daily.  Marland Kitchen trimethoprim (TRIMPEX) 100 MG tablet Take 100 mg by mouth daily.  . Vitamin D, Ergocalciferol, (DRISDOL) 1.25 MG (50000 UNIT) CAPS capsule Take 1 capsule (50,000 Units total) by mouth every 7 (seven) days.  . zaleplon (SONATA) 10 MG capsule Take 1 qhs prn and may repeat 1 for midnocturnal awakening prn if she has 3 hours left to sleep     Allergies:   Iohexol   Social History   Socioeconomic History  . Marital status: Married    Spouse name: Not on file  . Number of children: 2  . Years of education: Not on file  . Highest education level: Not on file  Occupational History  . Occupation: disability  Tobacco Use  .  Smoking status: Former Smoker    Packs/day: 1.00    Years: 5.00    Pack years: 5.00    Types: Cigarettes    Quit date: 02/09/2002    Years since quitting: 17.8  . Smokeless tobacco: Never Used  Vaping Use  . Vaping Use: Never used  Substance and Sexual Activity  . Alcohol use: No  . Drug use: No  . Sexual activity: Yes    Birth control/protection: Surgical  Other Topics Concern  . Not on file  Social History Narrative   Step brother-2   Step sister-2   Half sister -1 healthy   Social Determinants of Health   Financial Resource Strain:   . Difficulty of Paying Living Expenses: Not on file  Food Insecurity:   . Worried About Charity fundraiser in the Last Year: Not on file  . Ran Out of Food in the Last Year: Not on file  Transportation Needs:   . Lack of Transportation (Medical): Not on file  . Lack of Transportation (Non-Medical): Not on file  Physical Activity:   . Days of Exercise per Week: Not on file  . Minutes of Exercise per Session: Not on file  Stress:   . Feeling of Stress : Not on file  Social Connections:   . Frequency of Communication with Friends and Family: Not on file  . Frequency of Social Gatherings with Friends and Family: Not on file  . Attends Religious Services: Not on file  . Active Member of Clubs or Organizations: Not on file  . Attends Archivist Meetings: Not on file  . Marital Status: Not on file     Family History: The patient's family history includes Alcoholism in her father; Allergies in an other family member; Anxiety disorder in her father; Asthma in her mother; Atrial fibrillation in her mother; Bone cancer in her paternal grandfather; Cancer in her father and mother; Colon cancer in her father and paternal grandfather; Congestive Heart Failure in her maternal grandmother  and mother; Depression in her father; Diabetes in her maternal grandmother and mother; Hyperlipidemia in her mother; Hypertension in her father; Mental  illness in her paternal grandmother; Multiple myeloma in her father; Non-Hodgkin's lymphoma in her mother; Obesity in her father; Rheum arthritis in her mother.  ROS:   Please see the history of present illness.    Review of Systems  Constitutional: Positive for malaise/fatigue. Negative for chills and fever.  HENT: Negative for congestion and hearing loss.   Eyes: Negative for blurred vision.  Respiratory: Positive for shortness of breath.   Cardiovascular: Positive for palpitations and leg swelling. Negative for chest pain, orthopnea, claudication and PND.  Gastrointestinal: Positive for constipation, heartburn and nausea. Negative for vomiting.  Genitourinary: Negative for urgency.  Musculoskeletal: Positive for myalgias.  Neurological: Positive for dizziness. Negative for loss of consciousness.  Psychiatric/Behavioral: Negative for substance abuse.    EKGs/Labs/Other Studies Reviewed:    The following studies were reviewed today:   EKG:  EKG 10/25/19  NSR with q waves laterally.  Recent Labs: 12/17/2019: ALT 32; BUN 19; Creatinine, Ser 0.99; Hemoglobin 12.9; Platelets 425; Potassium 4.1; Sodium 137  Recent Lipid Panel    Component Value Date/Time   CHOL 358 (H) 12/17/2019 1052   TRIG 1,170 (HH) 12/17/2019 1052   HDL 41 12/17/2019 1052   CHOLHDL 8.7 (H) 12/17/2019 1052   CHOLHDL 4.9 05/04/2008 0606   VLDL 24 05/04/2008 0606   LDLCALC Comment (A) 12/17/2019 1052     Physical Exam:    VS:  BP 110/68   Pulse 97   Ht '5\' 6"'  (1.676 m)   Wt 252 lb (114.3 kg)   SpO2 96%   BMI 40.67 kg/m     Wt Readings from Last 3 Encounters:  12/17/19 252 lb (114.3 kg)  12/14/19 246 lb (111.6 kg)  11/24/19 248 lb (112.5 kg)     GEN:  Comfortable, mildly SOB with talking with mask HEENT: Normal NECK: No JVD CARDIAC:RRR, no murmurs, rubs, gallops RESPIRATORY:  Clear to auscultation without rales, wheezing or rhonchi. Normal work of breathing ABDOMEN: Soft, non-tender,  non-distended MUSCULOSKELETAL:  Warm, trace edema  SKIN: Warm and dry NEUROLOGIC:  Alert and oriented x 3 PSYCHIATRIC:  Normal affect   ASSESSMENT:    1. Dyspnea on exertion   2. Hypertriglyceridemia   3. Mixed hyperlipidemia   4. Morbid obesity ([place])   5. Snoring   6. Sinus tachycardia    PLAN:    In order of problems listed above:  #Severe Shortness of Breath on Exertion: Patient with significant shortness of breath on exertion and is unable to make it less than 1 block without needing to stop and catch her breath. This has been getting progressively worse and has significantly limited her activity level. No associated chest pain but feels like her heart is racing during these episodes. Given progressive nature and severe exercise limitation, concern for anginal equivalent. 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 We discussed how we could start with a CTA vs proceeding directly to RHC/LHC and patient opted to proceed with cath at this time. Risks, benefits and alternatives discussed with her and her husband. -Plan for RHC/LHC -Will pre-treat with prednisone and benadryl due to contrast allergy -Check TTE -Start ASA 38m daily and crestor 239mdaily -Continue home metoprolol 2597mL  #Suspected OSA  #Snoring #Daytime fatigue: Patient with significant daytime somnolence and noted to have apneic episodes by her husband. 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 Similar levels in her mother which is suspicious for genetic etiology.  -Start crestor 105m daily -Start vascepa 2g BID -Start zetia 157mdaily -Follow-up in lipid clinic  #Morbid Obesity with BMI 40: -Followed by weight loss clinic -Will work on coverage for [facility]  #Sinus tachycardia: -Will work-up above conditions as likely driving rapid heart rates and pursue further  work-up if needed  INFORMED CONSENT: I have reviewed the risks, indications, and alternatives to cardiac catheterization, possible angioplasty, and stenting with the patient. Risks include but are not limited to bleeding, infection, vascular injury, stroke, myocardial infection, arrhythmia, kidney injury, radiation-related injury in the case of prolonged fluoroscopy use, emergency cardiac surgery, and death. The patient understands the risks of serious complication is 1-2 in 103291ith diagnostic cardiac cath and 1-2% or less with angioplasty/stenting.    Shared Decision Making/Informed Consent      Medication Adjustments/Labs and Tests Ordered: Current medicines are reviewed at length with the patient today.  Concerns regarding medicines are outlined above.  Orders Placed This Encounter  Procedures  . Lipid panel  . Hepatic function panel  . Basic metabolic panel  . CBC with Differential/Platelet  . [facility]  . ECHOCARDIOGRAM COMPLETE  . Split night study   Meds ordered this encounter  Medications  . DISCONTD: icosapent Ethyl (VASCEPA) 1 g capsule    Sig: Take 2 capsules (2 g total) by mouth 2 (two) times daily.    Dispense:  120 capsule    Refill:  11  . aspirin EC 81 MG tablet    Sig: Take 1 tablet (81 mg total) by mouth daily. Swallow whole.    Dispense:  30 tablet    Refill:  11  . ezetimibe (ZETIA) 10 MG tablet    Sig: Take 1 tablet (10 mg total) by mouth daily.    Dispense:  90 tablet    Refill:  3  . rosuvastatin (CRESTOR) 20 MG tablet    Sig: Take 1 tablet (20 mg total) by mouth daily.    Dispense:  90 tablet    Refill:  3  . predniSONE (DELTASONE) 50 MG tablet    Sig: Take one tablet by mouth at 9:30 pm on 12/22/19 (13 hours prior to procedure), take one tablet by mouth at 3:30 am on 12/23/19 (7 hours prior to procedure), take one tablet by mouth when 7:30 am on 12/23/19.    Dispense:  3 tablet    Refill:  0  . diphenhydrAMINE (BENADRYL)  50 MG tablet    Sig: Take one tablet by mouth 7:30 am on 12/23/19    Dispense:  1 tablet    Refill:  0  . icosapent Ethyl (VASCEPA) 1 g capsule    Sig: Take 2 capsules (2 g total) by mouth 2 (two) times daily.    Dispense:  360 capsule    Refill:  3    Patient Instructions  Medication Instructions:  Your physician has recommended you make the following change in your medication:  1-START Vascepa 2 gram by mouth twice daily 2-START Crestor 20 mg by mouth daily 3-START Zetia 10 mg by mouth daily 4-START Aspirin 81 mg by mouth daily  *If you need a refill on your cardiac medications before your next appointment, please call your pharmacy*  Lab Work: Your physician recommends that you have lab work today- Lipid panel, liver panel, BMET, and CBC  If you  have labs (blood work) drawn today and your tests are completely normal, you will receive your results only by: Marland Kitchen MyChart Message (if you have MyChart) OR . A paper copy in the mail If you have any lab test that is abnormal or we need to change your treatment, we will call you to review the results.   Testing/Procedures: Your physician has requested that you have an echocardiogram. Echocardiography is a painless test that uses sound waves to create images of your heart. It provides your doctor with information about the size and shape of your heart and how well your heart's chambers and valves are working. This procedure takes approximately one hour. There are no restrictions for this procedure.   Your physician has recommended that you have a sleep study. This test records several body functions during sleep, including: brain activity, eye movement, oxygen and carbon dioxide blood levels, heart rate and rhythm, breathing rate and rhythm, the flow of air through your mouth and nose, snoring, body muscle movements, and chest and belly movement.  Your physician has requested that you have a cardiac catheterization. Cardiac catheterization  is used to diagnose and/or treat various heart conditions. Doctors may recommend this procedure for a number of different reasons. The most common reason is to evaluate chest pain. Chest pain can be a symptom of coronary artery disease (CAD), and cardiac catheterization can show whether plaque is narrowing or blocking your heart's arteries. This procedure is also used to evaluate the valves, as well as measure the blood flow and oxygen levels in different parts of your heart. For further information please visit [email or web address]. Please follow instruction sheet, as given.  Follow-Up: At [facility], you and your health needs are our priority.  As part of our continuing mission to provide you with exceptional heart care, we have created designated Provider Care Teams.  These Care Teams include your primary Cardiologist (physician) and Advanced Practice Providers (APPs -  Physician Assistants and Nurse Practitioners) who all work together to provide you with the care you need, when you need it.  We recommend signing up for the patient portal called "MyChart".  Sign up information is provided on this After Visit Summary.  MyChart is used to connect with patients for Virtual Visits (Telemedicine).  Patients are able to view lab/test results, encounter notes, upcoming appointments, etc.  Non-urgent messages can be sent to your provider as well.   To learn more about what you can do with MyChart, go to [email or web address].    Your next appointment:   2 week(s)  The format for your next appointment:   In Person  Provider:   You may see Dr. Johney Frame or one of the following Advanced Practice Providers on your designated Care Team:    Richardson Dopp, PA-C  Vin Edgewood, PA-C  You have been referred to lipid clinic.   Other Instructions     [place] OFFICE [place], [place]  [place] [address] Dept:  [phone] Loc: [place]  12/17/2019  You are scheduled for a Cardiac Catheterization on Thursday, November 11 with Dr. Lauree Chandler.  1. Please arrive at the [facility] (Main Entrance A) at [facility]: [address] [place], [place] [address] at 8:30 AM (This time is two hours before your procedure to ensure your preparation). Free valet parking service is available.   Special note: Every effort is made to have your procedure  done on time. Please understand that emergencies sometimes delay scheduled procedures.  2. Diet: Do not eat solid foods after midnight.  The patient may have clear liquids until 5am upon the day of the procedure.  3. Labs: Will be done today.  Due to recent COVID-19 restrictions implemented by our local and state authorities and in an effort to keep both patients and staff as safe as possible, our hospital system requires COVID-19 testing prior to certain scheduled hospital procedures.  Please go to [place]. [place], [place] [address] on 12/21/19  at 12:10  .  This is a drive up testing site.  You will not need to exit your vehicle.  You will not be billed at the time of testing but may receive a bill later depending on your insurance. You must agree to self-quarantine from the time of your testing until the procedure date on 12/21/19.  This should included staying home with ONLY the people you live with.  Avoid take-out, grocery store shopping or leaving the house for any non-emergent reason.  Failure to have your COVID-19 test done on the date and time you have been scheduled will result in cancellation of your procedure.  Please call our office at [phone] if you have any questions.  4. Medication instructions in preparation for your procedure:   Contrast Allergy: Yes, Please take Prednisone 62m by mouth at: Thirteen hours prior to cath 9:00pm on Wednesday Seven hours prior to cath 3:00am on Thursday And prior to  leaving home please take last dose of Prednisone 568mand Benadryl 5019my mouth. On Thursday  Stop taking, Torsemide (Demadex) Thursday, November 11,  Do not take Diabetes Med Glucophage (Metformin) on the day of the procedure and HOLD 48 HOURS AFTER THE PROCEDURE.  On the morning of your procedure, take your Aspirin and any morning medicines NOT listed above.  You may use sips of water.  5. Plan for one night stay--bring personal belongings. 6. Bring a current list of your medications and current insurance cards. 7. You MUST have a responsible person to drive you home. 8. Someone MUST be with you the first 24 hours after you arrive home or your discharge will be delayed. 9. Please wear clothes that are easy to get on and off and wear slip-on shoes.  Thank you for allowing us [place] care for you!   -- [facility]     Signed, HeaFreada BergeronD  12/17/2019 7:08 PM    [place]

## 2019-12-16 NOTE — H&P (View-Only) (Signed)
Cardiology Office Note:    Date:  12/17/2019   ID:  Kristen [name], DOB 01-25-1971, MRN [id number]  PCP:  Manon Hilding, MD  [place] Cardiologist:  No primary care provider on file.  [facility] Electrophysiologist:  None   Referring MD: Manon Hilding, MD    History of Present Illness:    Kristen [name] is a 49 y.o. female with a hx of anxiety, HLD, obesity, DMII, and HTN who was referred by Dr. Quintin Alto for evaluation of shortness of breath  Patient states she has been having severe shortness of breath that has been getting progressively worse over the past several months. She states that walking from the house to the mailbox to the house she has to stop to catch her breath.  She can only make it 2 stairs without needing to stop and rest.  No chest pressure but gets palpitations. No nausea but dizziness/lightheadedness. No LOC. Has LE edema and was started on lasix; no orthopnea or PND. She snores loudly and noted to have apneic episodes by her husband not currently on CPAP. No known asthma, COPD or smoking. Has not seen pulmonary   In terms of palpitations, she can have them come on or at rest. Has some lightheadedness, dizziness but no syncope. She has been seen at Urgent care for the racing heart rate where she was noted to be in sinus rhythm. At that time, the patient was having a diarrheal illness and improved with IVF.    Also notes LE edema and has recently been started on lasix, which has helped. Has never had echo or cath that she knows of.  Family history: Mother with Afib, AVR, CHF; Father-unknown. No siblings.   LDL 117, HDL 40, TC 312, TG 1036  Past Medical History:  Diagnosis Date  . Allergies   . Anemia   . Anxiety   . Arthritis   . Atypical mole 11/03/2006   mid upper back (slight to moderate)  . Atypical mole 11/03/2006   lower right back (slight to moderate)  . Back pain   . Bipolar 1 disorder ([place])   . Borderline diabetic   . Chronic  constipation   . Constipation   . Depression   . Family history of adverse reaction to anesthesia    mother-- ponv  . Fatty liver   . GAD (generalized anxiety disorder)   . GERD (gastroesophageal reflux disease)   . Hiatal hernia   . High triglycerides   . History of kidney stones   . History of recurrent UTIs   . History of sepsis 06/2018  . History of stomach ulcers   . History of suicidal ideation   . Hyperlipidemia   . Hypothyroidism   . IDA (iron deficiency anemia)   . Joint pain   . Melanoma ([place]) 11/03/2006   left chest in situ (excision)  . Orthostasis   . Palpitations   . PONV (postoperative nausea and vomiting)   . Prediabetes   . Rapid heart rate   . Renal calculus, left   . Seasonal allergies   . Shortness of breath   . Sleep disorder, unspecified    per excessive sleeping during the day  . Snoring   . Swallowing difficulty   . Tachycardia   . Vertigo   . Vitamin D deficiency   . Weakness   . Wears contact lenses     Past Surgical History:  Procedure Laterality Date  . ANTERIOR CERVICAL DECOMP/DISCECTOMY FUSION  02/17/2012   Procedure: ANTERIOR CERVICAL DECOMPRESSION/DISCECTOMY FUSION 2 LEVELS;  Surgeon: Hosie Spangle, MD;  Location: [facility];  Service: Neurosurgery;  Laterality: N/A;  Cervical four-five and Cervical six-seven anterior cervial decompression with fusion plating and bonegraft  . ANTERIOR CERVICAL DECOMP/DISCECTOMY FUSION  04-01-2001   '@[facility]'    C5---6  . BIOPSY  07/20/2018   Procedure: BIOPSY;  Surgeon: Daneil Dolin, MD;  [facility];  Service: Endoscopy;;  gastric   . BREAST REDUCTION SURGERY Bilateral 1995  . CESAREAN SECTION  x2   last one 1998   with BILATERAL TUBAL LIGATION  . CESAREAN SECTION WITH BILATERAL TUBAL LIGATION    . COLONOSCOPY WITH PROPOFOL N/A 07/20/2018   Dr. Gala Romney: Diverticulosis, internal hemorrhoids, 5 mm splenic flexure tubular adenoma removed.  . CYSTOSCOPY/URETEROSCOPY/HOLMIUM LASER/STENT  PLACEMENT Left 08/21/2018   Procedure: CYSTOSCOPY LEFT RETROGRADE PYELOGRAM /URETEROSCOPY/HOLMIUM LASER/STENT PLACEMENT;  Surgeon: Ceasar Mons, MD;  Location: [facility];  Service: Urology;  Laterality: Left;  . DILATION AND CURETTAGE OF UTERUS  1992   for miscarriage  . ENDOMETRIAL ABLATION  2014  . ESOPHAGOGASTRODUODENOSCOPY     20 years ago.   Marland Kitchen ESOPHAGOGASTRODUODENOSCOPY (EGD) WITH PROPOFOL N/A 07/20/2018   Dr. Gala Romney: Small hiatal hernia, erythematous mucosa in the stomach with a healing gastric ulcer measuring 1 cm, biopsies negative.  Marland Kitchen [place]   unilateral for infertility  . NASAL SINUS SURGERY  2010  approx.  Marland Kitchen POLYPECTOMY  07/20/2018   Procedure: POLYPECTOMY;  Surgeon: Daneil Dolin, MD;  [facility];  Service: Endoscopy;;  . POSTERIOR CERVICAL FUSION/FORAMINOTOMY N/A 08/18/2013   Procedure: CERVICAL FOUR TO CERVICAL SEVEN POSTERIOR CERVICAL FUSION/FORAMINOTOMY LEVEL 3;  Surgeon: Hosie Spangle, MD;  Location: [facility];  Service: Neurosurgery;  Laterality: N/A;  C4-7 posterior cervical fusion with lateral mass fixation    Current Medications: Current Meds  Medication Sig  . acetaminophen (TYLENOL) 325 MG tablet Take 2 tablets (650 mg total) by mouth every 6 (six) hours as needed for mild pain (or Fever >/= 101).  Marland Kitchen ALPRAZolam (XANAX) 1 MG tablet TAKE 1 TABLET BY MOUTH THREE TIMES DAILY AS NEEDED FOR ANXIETY  . buPROPion (WELLBUTRIN XL) 150 MG 24 hr tablet TAKE 3 TABLETS BY MOUTH IN THE MORNING  . cetirizine (ZYRTEC) 10 MG tablet Take 10 mg by mouth daily.   . fluticasone (FLONASE) 50 MCG/ACT nasal spray Place 2 sprays into both nostrils daily as needed for allergies or rhinitis.   Marland Kitchen lamoTRIgine (LAMICTAL) 200 MG tablet Take 2.5 tablets (500 mg total) by mouth daily.  Marland Kitchen levocetirizine (XYZAL) 5 MG tablet Take 5 mg by mouth every evening.  Marland Kitchen levothyroxine (SYNTHROID) 75 MCG tablet Take 75 mcg by mouth daily before breakfast.    . LINZESS 290 MCG CAPS capsule TAKE 1 CAPSULE BY MOUTH ONCE DAILY BEFORE BREAKFAST  . meclizine (ANTIVERT) 25 MG tablet Take 25 mg by mouth 3 (three) times daily as needed for dizziness.  . metFORMIN (GLUCOPHAGE) 500 MG tablet Take 1 tablet (500 mg total) by mouth 2 (two) times daily with a meal.  . metoprolol succinate (TOPROL-XL) 25 MG 24 hr tablet Take 25 mg by mouth daily.  . montelukast (SINGULAIR) 10 MG tablet TAKE 1 TABLET BY MOUTH ONCE DAILY  . ondansetron (ZOFRAN) 4 MG tablet TAKE 1 TABLET BY MOUTH EVERY 6 HOURS AS NEEDED FOR NAUSEA OR VOMITING  . pantoprazole (PROTONIX) 40 MG tablet Take 1 tablet (40 mg total)  by mouth 2 (two) times daily with a meal.  . polyethylene glycol (MIRALAX / GLYCOLAX) 17 g packet Take 17 g by mouth 2 (two) times daily. (Patient taking differently: Take 17 g by mouth as needed. )  . pramipexole (MIRAPEX) 1 MG tablet Take 1 tablet (1 mg total) by mouth 2 (two) times daily.  . Pseudoephedrine-Guaifenesin (MUCINEX D MAX STRENGTH) [phone] MG TB12 Take by mouth as needed.   Marland Kitchen QUEtiapine (SEROQUEL XR) 400 MG 24 hr tablet Take 3 tablets (1,200 mg total) by mouth at bedtime. Take 2.5 pills qhs  . sertraline (ZOLOFT) 100 MG tablet Take 1 tablet (100 mg total) by mouth daily.  Marland Kitchen torsemide (DEMADEX) 20 MG tablet Take 20 mg by mouth daily.  Marland Kitchen trimethoprim (TRIMPEX) 100 MG tablet Take 100 mg by mouth daily.  . Vitamin D, Ergocalciferol, (DRISDOL) 1.25 MG (50000 UNIT) CAPS capsule Take 1 capsule (50,000 Units total) by mouth every 7 (seven) days.  . zaleplon (SONATA) 10 MG capsule Take 1 qhs prn and may repeat 1 for midnocturnal awakening prn if she has 3 hours left to sleep     Allergies:   Iohexol   Social History   Socioeconomic History  . Marital status: Married    Spouse name: Not on file  . Number of children: 2  . Years of education: Not on file  . Highest education level: Not on file  Occupational History  . Occupation: disability  Tobacco Use  .  Smoking status: Former Smoker    Packs/day: 1.00    Years: 5.00    Pack years: 5.00    Types: Cigarettes    Quit date: 02/09/2002    Years since quitting: 17.8  . Smokeless tobacco: Never Used  Vaping Use  . Vaping Use: Never used  Substance and Sexual Activity  . Alcohol use: No  . Drug use: No  . Sexual activity: Yes    Birth control/protection: Surgical  Other Topics Concern  . Not on file  Social History Narrative   Step brother-2   Step sister-2   Half sister -1 healthy   Social Determinants of Health   Financial Resource Strain:   . Difficulty of Paying Living Expenses: Not on file  Food Insecurity:   . Worried About Charity fundraiser in the Last Year: Not on file  . Ran Out of Food in the Last Year: Not on file  Transportation Needs:   . Lack of Transportation (Medical): Not on file  . Lack of Transportation (Non-Medical): Not on file  Physical Activity:   . Days of Exercise per Week: Not on file  . Minutes of Exercise per Session: Not on file  Stress:   . Feeling of Stress : Not on file  Social Connections:   . Frequency of Communication with Friends and Family: Not on file  . Frequency of Social Gatherings with Friends and Family: Not on file  . Attends Religious Services: Not on file  . Active Member of Clubs or Organizations: Not on file  . Attends Archivist Meetings: Not on file  . Marital Status: Not on file     Family History: The patient's family history includes Alcoholism in her father; Allergies in an other family member; Anxiety disorder in her father; Asthma in her mother; Atrial fibrillation in her mother; Bone cancer in her paternal grandfather; Cancer in her father and mother; Colon cancer in her father and paternal grandfather; Congestive Heart Failure in her maternal grandmother  and mother; Depression in her father; Diabetes in her maternal grandmother and mother; Hyperlipidemia in her mother; Hypertension in her father; Mental  illness in her paternal grandmother; Multiple myeloma in her father; Non-Hodgkin's lymphoma in her mother; Obesity in her father; Rheum arthritis in her mother.  ROS:   Please see the history of present illness.    Review of Systems  Constitutional: Positive for malaise/fatigue. Negative for chills and fever.  HENT: Negative for congestion and hearing loss.   Eyes: Negative for blurred vision.  Respiratory: Positive for shortness of breath.   Cardiovascular: Positive for palpitations and leg swelling. Negative for chest pain, orthopnea, claudication and PND.  Gastrointestinal: Positive for constipation, heartburn and nausea. Negative for vomiting.  Genitourinary: Negative for urgency.  Musculoskeletal: Positive for myalgias.  Neurological: Positive for dizziness. Negative for loss of consciousness.  Psychiatric/Behavioral: Negative for substance abuse.    EKGs/Labs/Other Studies Reviewed:    The following studies were reviewed today:   EKG:  EKG 10/25/19  NSR with q waves laterally.  Recent Labs: 12/17/2019: ALT 32; BUN 19; Creatinine, Ser 0.99; Hemoglobin 12.9; Platelets 425; Potassium 4.1; Sodium 137  Recent Lipid Panel    Component Value Date/Time   CHOL 358 (H) 12/17/2019 1052   TRIG 1,170 (HH) 12/17/2019 1052   HDL 41 12/17/2019 1052   CHOLHDL 8.7 (H) 12/17/2019 1052   CHOLHDL 4.9 05/04/2008 0606   VLDL 24 05/04/2008 0606   LDLCALC Comment (A) 12/17/2019 1052     Physical Exam:    VS:  BP 110/68   Pulse 97   Ht '5\' 6"'  (1.676 m)   Wt 252 lb (114.3 kg)   SpO2 96%   BMI 40.67 kg/m     Wt Readings from Last 3 Encounters:  12/17/19 252 lb (114.3 kg)  12/14/19 246 lb (111.6 kg)  11/24/19 248 lb (112.5 kg)     GEN:  Comfortable, mildly SOB with talking with mask HEENT: Normal NECK: No JVD CARDIAC:RRR, no murmurs, rubs, gallops RESPIRATORY:  Clear to auscultation without rales, wheezing or rhonchi. Normal work of breathing ABDOMEN: Soft, non-tender,  non-distended MUSCULOSKELETAL:  Warm, trace edema  SKIN: Warm and dry NEUROLOGIC:  Alert and oriented x 3 PSYCHIATRIC:  Normal affect   ASSESSMENT:    1. Dyspnea on exertion   2. Hypertriglyceridemia   3. Mixed hyperlipidemia   4. Morbid obesity ([place])   5. Snoring   6. Sinus tachycardia    PLAN:    In order of problems listed above:  #Severe Shortness of Breath on Exertion: Patient with significant shortness of breath on exertion and is unable to make it less than 1 block without needing to stop and catch her breath. This has been getting progressively worse and has significantly limited her activity level. No associated chest pain but feels like her heart is racing during these episodes. Given progressive nature and severe exercise limitation, concern for anginal equivalent. 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 We discussed how we could start with a CTA vs proceeding directly to RHC/LHC and patient opted to proceed with cath at this time. Risks, benefits and alternatives discussed with her and her husband. -Plan for RHC/LHC -Will pre-treat with prednisone and benadryl due to contrast allergy -Check TTE -Start ASA 69m daily and crestor 260mdaily -Continue home metoprolol 2564mL  #Suspected OSA  #Snoring #Daytime fatigue: Patient with significant daytime somnolence and noted to have apneic episodes by her husband. 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 Similar levels in her mother which is suspicious for genetic etiology.  -Start crestor 51m daily -Start vascepa 2g BID -Start zetia 123mdaily -Follow-up in lipid clinic  #Morbid Obesity with BMI 40: -Followed by weight loss clinic -Will work on coverage [facility]  #Sinus tachycardia: -Will work-up above conditions as likely driving rapid heart rates and pursue further  work-up if needed  INFORMED CONSENT: I have reviewed the risks, indications, and alternatives to cardiac catheterization, possible angioplasty, and stenting with the patient. Risks include but are not limited to bleeding, infection, vascular injury, stroke, myocardial infection, arrhythmia, kidney injury, radiation-related injury in the case of prolonged fluoroscopy use, emergency cardiac surgery, and death. The patient understands the risks of serious complication is 1-2 in 105277ith diagnostic cardiac cath and 1-2% or less with angioplasty/stenting.    Shared Decision Making/Informed Consent      Medication Adjustments/Labs and Tests Ordered: Current medicines are reviewed at length with the patient today.  Concerns regarding medicines are outlined above.  Orders Placed This Encounter  Procedures  . Lipid panel  . Hepatic function panel  . Basic metabolic panel  . CBC with Differential/Platelet  . [facility]  . ECHOCARDIOGRAM COMPLETE  . Split night study   Meds ordered this encounter  Medications  . DISCONTD: icosapent Ethyl (VASCEPA) 1 g capsule    Sig: Take 2 capsules (2 g total) by mouth 2 (two) times daily.    Dispense:  120 capsule    Refill:  11  . aspirin EC 81 MG tablet    Sig: Take 1 tablet (81 mg total) by mouth daily. Swallow whole.    Dispense:  30 tablet    Refill:  11  . ezetimibe (ZETIA) 10 MG tablet    Sig: Take 1 tablet (10 mg total) by mouth daily.    Dispense:  90 tablet    Refill:  3  . rosuvastatin (CRESTOR) 20 MG tablet    Sig: Take 1 tablet (20 mg total) by mouth daily.    Dispense:  90 tablet    Refill:  3  . predniSONE (DELTASONE) 50 MG tablet    Sig: Take one tablet by mouth at 9:30 pm on 12/22/19 (13 hours prior to procedure), take one tablet by mouth at 3:30 am on 12/23/19 (7 hours prior to procedure), take one tablet by mouth when 7:30 am on 12/23/19.    Dispense:  3 tablet    Refill:  0  . diphenhydrAMINE (BENADRYL)  50 MG tablet    Sig: Take one tablet by mouth 7:30 am on 12/23/19    Dispense:  1 tablet    Refill:  0  . icosapent Ethyl (VASCEPA) 1 g capsule    Sig: Take 2 capsules (2 g total) by mouth 2 (two) times daily.    Dispense:  360 capsule    Refill:  3    Patient Instructions  Medication Instructions:  Your physician has recommended you make the following change in your medication:  1-START Vascepa 2 gram by mouth twice daily 2-START Crestor 20 mg by mouth daily 3-START Zetia 10 mg by mouth daily 4-START Aspirin 81 mg by mouth daily  *If you need a refill on your cardiac medications before your next appointment, please call your pharmacy*  Lab Work: Your physician recommends that you have lab work today- Lipid panel, liver panel, BMET, and CBC  If you  have labs (blood work) drawn today and your tests are completely normal, you will receive your results only by: Marland Kitchen MyChart Message (if you have MyChart) OR . A paper copy in the mail If you have any lab test that is abnormal or we need to change your treatment, we will call you to review the results.   Testing/Procedures: Your physician has requested that you have an echocardiogram. Echocardiography is a painless test that uses sound waves to create images of your heart. It provides your doctor with information about the size and shape of your heart and how well your heart's chambers and valves are working. This procedure takes approximately one hour. There are no restrictions for this procedure.   Your physician has recommended that you have a sleep study. This test records several body functions during sleep, including: brain activity, eye movement, oxygen and carbon dioxide blood levels, heart rate and rhythm, breathing rate and rhythm, the flow of air through your mouth and nose, snoring, body muscle movements, and chest and belly movement.  Your physician has requested that you have a cardiac catheterization. Cardiac catheterization  is used to diagnose and/or treat various heart conditions. Doctors may recommend this procedure for a number of different reasons. The most common reason is to evaluate chest pain. Chest pain can be a symptom of coronary artery disease (CAD), and cardiac catheterization can show whether plaque is narrowing or blocking your heart's arteries. This procedure is also used to evaluate the valves, as well as measure the blood flow and oxygen levels in different parts of your heart. For further information please visit [email or web address]. Please follow instruction sheet, as given.  Follow-Up: At [facility], you and your health needs are our priority.  As part of our continuing mission to provide you with exceptional heart care, we have created designated Provider Care Teams.  These Care Teams include your primary Cardiologist (physician) and Advanced Practice Providers (APPs -  Physician Assistants and Nurse Practitioners) who all work together to provide you with the care you need, when you need it.  We recommend signing up for the patient portal called "MyChart".  Sign up information is provided on this After Visit Summary.  MyChart is used to connect with patients for Virtual Visits (Telemedicine).  Patients are able to view lab/test results, encounter notes, upcoming appointments, etc.  Non-urgent messages can be sent to your provider as well.   To learn more about what you can do with MyChart, go to [email or web address].    Your next appointment:   2 week(s)  The format for your next appointment:   In Person  Provider:   You may see Dr. Johney Frame or one of the following Advanced Practice Providers on your designated Care Team:    Richardson Dopp, PA-C  Vin Yah-ta-hey, PA-C  You have been referred to lipid clinic.   Other Instructions     [place] OFFICE [place], [place] [address] Dept:  [phone] Loc: [place]  12/17/2019  You are scheduled for a Cardiac Catheterization on Thursday, November 11 with Dr. Lauree Chandler.  1. Please arrive at the [facility] (Main Entrance A) at [facility]: [address] [place], [place] [address] at 8:30 AM (This time is two hours before your procedure to ensure your preparation). Free valet parking service is available.   Special note: Every effort is made to have your procedure  done on time. Please understand that emergencies sometimes delay scheduled procedures.  2. Diet: Do not eat solid foods after midnight.  The patient may have clear liquids until 5am upon the day of the procedure.  3. Labs: Will be done today.  Due to recent COVID-19 restrictions implemented by our local and state authorities and in an effort to keep both patients and staff as safe as possible, our hospital system requires COVID-19 testing prior to certain scheduled hospital procedures.  Please go to [place]. [place], [place] [address] on 12/21/19  at 12:10  .  This is a drive up testing site.  You will not need to exit your vehicle.  You will not be billed at the time of testing but may receive a bill later depending on your insurance. You must agree to self-quarantine from the time of your testing until the procedure date on 12/21/19.  This should included staying home with ONLY the people you live with.  Avoid take-out, grocery store shopping or leaving the house for any non-emergent reason.  Failure to have your COVID-19 test done on the date and time you have been scheduled will result in cancellation of your procedure.  Please call our office at [phone] if you have any questions.  4. Medication instructions in preparation for your procedure:   Contrast Allergy: Yes, Please take Prednisone 68m by mouth at: Thirteen hours prior to cath 9:00pm on Wednesday Seven hours prior to cath 3:00am on Thursday And prior to  leaving home please take last dose of Prednisone 559mand Benadryl 5035my mouth. On Thursday  Stop taking, Torsemide (Demadex) Thursday, November 11,  Do not take Diabetes Med Glucophage (Metformin) on the day of the procedure and HOLD 48 HOURS AFTER THE PROCEDURE.  On the morning of your procedure, take your Aspirin and any morning medicines NOT listed above.  You may use sips of water.  5. Plan for one night stay--bring personal belongings. 6. Bring a current list of your medications and current insurance cards. 7. You MUST have a responsible person to drive you home. 8. Someone MUST be with you the first 24 hours after you arrive home or your discharge will be delayed. 9. Please wear clothes that are easy to get on and off and wear slip-on shoes.  Thank you for allowing us [place] care for you!   -- [facility]     Signed, HeaFreada BergeronD  12/17/2019 7:08 PM    [place]

## 2019-12-17 ENCOUNTER — Ambulatory Visit (INDEPENDENT_AMBULATORY_CARE_PROVIDER_SITE_OTHER): Payer: 59 | Admitting: Cardiology

## 2019-12-17 ENCOUNTER — Encounter: Payer: Self-pay | Admitting: Cardiology

## 2019-12-17 ENCOUNTER — Other Ambulatory Visit: Payer: Self-pay

## 2019-12-17 VITALS — BP 110/68 | HR 97 | Ht 66.0 in | Wt 252.0 lb

## 2019-12-17 DIAGNOSIS — E781 Pure hyperglyceridemia: Secondary | ICD-10-CM

## 2019-12-17 DIAGNOSIS — R0600 Dyspnea, unspecified: Secondary | ICD-10-CM | POA: Diagnosis not present

## 2019-12-17 DIAGNOSIS — R000 Tachycardia, unspecified: Secondary | ICD-10-CM

## 2019-12-17 DIAGNOSIS — E782 Mixed hyperlipidemia: Secondary | ICD-10-CM

## 2019-12-17 DIAGNOSIS — R0683 Snoring: Secondary | ICD-10-CM

## 2019-12-17 DIAGNOSIS — R0609 Other forms of dyspnea: Secondary | ICD-10-CM

## 2019-12-17 LAB — BASIC METABOLIC PANEL
BUN/Creatinine Ratio: 19 (ref 9–23)
BUN: 19 mg/dL (ref 6–24)
CO2: 21 mmol/L (ref 20–29)
Calcium: 10.1 mg/dL (ref 8.7–10.2)
Chloride: 98 mmol/L (ref 96–106)
Creatinine, Ser: 0.99 mg/dL (ref 0.57–1.00)
GFR calc Af Amer: 77 mL/min/{1.73_m2} (ref >59)
GFR calc non Af Amer: 67 mL/min/{1.73_m2} (ref >59)
Glucose: 130 mg/dL — ABNORMAL HIGH (ref 65–99)
Potassium: 4.1 mmol/L (ref 3.5–5.2)
Sodium: 137 mmol/L (ref 134–144)

## 2019-12-17 LAB — CBC WITH DIFFERENTIAL/PLATELET
Basophils Absolute: 0.1 10*3/uL (ref 0.0–0.2)
Basos: 1 %
EOS (ABSOLUTE): 0.8 10*3/uL — ABNORMAL HIGH (ref 0.0–0.4)
Eos: 6 %
Hematocrit: 39 % (ref 34.0–46.6)
Hemoglobin: 12.9 g/dL (ref 11.1–15.9)
Immature Grans (Abs): 0.4 10*3/uL — ABNORMAL HIGH (ref 0.0–0.1)
Immature Granulocytes: 3 %
Lymphocytes Absolute: 3 10*3/uL (ref 0.7–3.1)
Lymphs: 22 %
MCH: 28.5 pg (ref 26.6–33.0)
MCHC: 33.1 g/dL (ref 31.5–35.7)
MCV: 86 fL (ref 79–97)
Monocytes Absolute: 1 10*3/uL — ABNORMAL HIGH (ref 0.1–0.9)
Monocytes: 8 %
Neutrophils Absolute: 8.2 10*3/uL — ABNORMAL HIGH (ref 1.4–7.0)
Neutrophils: 60 %
Platelets: 425 10*3/uL (ref 150–450)
RBC: 4.52 x10E6/uL (ref 3.77–5.28)
RDW: 13.5 % (ref 11.7–15.4)
WBC: 13.6 10*3/uL — ABNORMAL HIGH (ref 3.4–10.8)

## 2019-12-17 LAB — LIPID PANEL
Chol/HDL Ratio: 8.7 ratio — ABNORMAL HIGH (ref 0.0–4.4)
Cholesterol, Total: 358 mg/dL — ABNORMAL HIGH (ref 100–199)
HDL: 41 mg/dL (ref >39)
Triglycerides: 1170 mg/dL (ref 0–149)

## 2019-12-17 LAB — HEPATIC FUNCTION PANEL
ALT: 32 IU/L (ref 0–32)
AST: 24 IU/L (ref 0–40)
Albumin: 4.8 g/dL (ref 3.8–4.8)
Alkaline Phosphatase: 173 IU/L — ABNORMAL HIGH (ref 44–121)
Bilirubin Total: 0.3 mg/dL (ref 0.0–1.2)
Bilirubin, Direct: 0.11 mg/dL (ref 0.00–0.40)
Total Protein: 6.9 g/dL (ref 6.0–8.5)

## 2019-12-17 MED ORDER — ROSUVASTATIN CALCIUM 20 MG PO TABS: 20.0000 mg | ORAL_TABLET | Freq: Every day | ORAL | 3 refills | Status: DC

## 2019-12-17 MED ORDER — ICOSAPENT ETHYL 1 G PO CAPS: 2.0000 g | ORAL_CAPSULE | Freq: Two times a day (BID) | ORAL | 11 refills | Status: DC

## 2019-12-17 MED ORDER — PREDNISONE 50 MG PO TABS: ORAL_TABLET | ORAL | 0 refills | Status: DC

## 2019-12-17 MED ORDER — DIPHENHYDRAMINE HCL 50 MG PO TABS: ORAL_TABLET | ORAL | 0 refills | Status: DC

## 2019-12-17 MED ORDER — ICOSAPENT ETHYL 1 G PO CAPS: 2.0000 g | ORAL_CAPSULE | Freq: Two times a day (BID) | ORAL | 3 refills | Status: DC

## 2019-12-17 MED ORDER — EZETIMIBE 10 MG PO TABS: 10.0000 mg | ORAL_TABLET | Freq: Every day | ORAL | 3 refills | Status: DC

## 2019-12-17 MED ORDER — ASPIRIN EC 81 MG PO TBEC: 81.0000 mg | DELAYED_RELEASE_TABLET | Freq: Every day | ORAL | 11 refills | Status: DC

## 2019-12-17 NOTE — Patient Instructions (Addendum)
Medication Instructions:  Your physician has recommended you make the following change in your medication:  1-START Vascepa 2 gram by mouth twice daily 2-START Crestor 20 mg by mouth daily 3-START Zetia 10 mg by mouth daily 4-START Aspirin 81 mg by mouth daily  *If you need a refill on your cardiac medications before your next appointment, please call your pharmacy*  Lab Work: Your physician recommends that you have lab work today- Lipid panel, liver panel, BMET, and CBC  If you have labs (blood work) drawn today and your tests are completely normal, you will receive your results only by: Marland Kitchen MyChart Message (if you have MyChart) OR . A paper copy in the mail If you have any lab test that is abnormal or we need to change your treatment, we will call you to review the results.   Testing/Procedures: Your physician has requested that you have an echocardiogram. Echocardiography is a painless test that uses sound waves to create images of your heart. It provides your doctor with information about the size and shape of your heart and how well your heart's chambers and valves are working. This procedure takes approximately one hour. There are no restrictions for this procedure.   Your physician has recommended that you have a sleep study. This test records several body functions during sleep, including: brain activity, eye movement, oxygen and carbon dioxide blood levels, heart rate and rhythm, breathing rate and rhythm, the flow of air through your mouth and nose, snoring, body muscle movements, and chest and belly movement.  Your physician has requested that you have a cardiac catheterization. Cardiac catheterization is used to diagnose and/or treat various heart conditions. Doctors may recommend this procedure for a number of different reasons. The most common reason is to evaluate chest pain. Chest pain can be a symptom of coronary artery disease (CAD), and cardiac catheterization can show whether  plaque is narrowing or blocking your heart's arteries. This procedure is also used to evaluate the valves, as well as measure the blood flow and oxygen levels in different parts of your heart. For further information please visit [email or web address].[email or web address]. Please follow instruction sheet, as given.  Follow-Up: At [facility], you and your health needs are our priority.  As part of our continuing mission to provide you with exceptional heart care, we have created designated Provider Care Teams.  These Care Teams include your primary Cardiologist (physician) and Advanced Practice Providers (APPs -  Physician Assistants and Nurse Practitioners) who all work together to provide you with the care you need, when you need it.  We recommend signing up for the patient portal called "MyChart".  Sign up information is provided on this After Visit Summary.  MyChart is used to connect with patients for Virtual Visits (Telemedicine).  Patients are able to view lab/test results, encounter notes, upcoming appointments, etc.  Non-urgent messages can be sent to your provider as well.   To learn more about what you can do with MyChart, go to [email or web address].    Your next appointment:   2 week(s)  The format for your next appointment:   In Person  Provider:   You may see Dr. Johney Frame or one of the following Advanced Practice Providers on your designated Care Team:    Richardson Dopp, PA-C  Vin [place], PA-C  You have been referred to lipid clinic.   Other Instructions     [address] OFFICE [address]  [place] [address] Dept: [phone] Loc: [place]  12/17/2019  You are scheduled for a Cardiac Catheterization on Thursday, November 11 with Dr. Lauree Chandler.  1. Please arrive at the [facility] (Main Entrance A) at [facility]: [address] [place], [place]  [address] at 8:30 AM (This time is two hours before your procedure to ensure your preparation). Free valet parking service is available.   Special note: Every effort is made to have your procedure done on time. Please understand that emergencies sometimes delay scheduled procedures.  2. Diet: Do not eat solid foods after midnight.  The patient may have clear liquids until 5am upon the day of the procedure.  3. Labs: Will be done today.  Due to recent COVID-19 restrictions implemented by our local and state authorities and in an effort to keep both patients and staff as safe as possible, our hospital system requires COVID-19 testing prior to certain scheduled hospital procedures.  Please go to [place]. [place], [place] [address] on 12/21/19  at 12:10  .  This is a drive up testing site.  You will not need to exit your vehicle.  You will not be billed at the time of testing but may receive a bill later depending on your insurance. You must agree to self-quarantine from the time of your testing until the procedure date on 12/21/19.  This should included staying home with ONLY the people you live with.  Avoid take-out, grocery store shopping or leaving the house for any non-emergent reason.  Failure to have your COVID-19 test done on the date and time you have been scheduled will result in cancellation of your procedure.  Please call our office at [phone] if you have any questions.  4. Medication instructions in preparation for your procedure:   Contrast Allergy: Yes, Please take Prednisone 50mg  by mouth at: Thirteen hours prior to cath 9:00pm on Wednesday Seven hours prior to cath 3:00am on Thursday And prior to leaving home please take last dose of Prednisone 50mg  and Benadryl 50mg  by mouth. On Thursday  Stop taking, Torsemide (Demadex) Thursday, November 11,  Do not take Diabetes Med Glucophage (Metformin) on the day of the procedure and HOLD 48 HOURS AFTER THE PROCEDURE.  On the morning  of your procedure, take your Aspirin and any morning medicines NOT listed above.  You may use sips of water.  5. Plan for one night stay--bring personal belongings. 6. Bring a current list of your medications and current insurance cards. 7. You MUST have a responsible person to drive you home. 8. Someone MUST be with you the first 24 hours after you arrive home or your discharge will be delayed. 9. Please wear clothes that are easy to get on and off and wear slip-on shoes.  Thank you for allowing [place] to care for you!   -- [facility]

## 2019-12-20 ENCOUNTER — Telehealth: Payer: Self-pay | Admitting: Physician Assistant

## 2019-12-20 ENCOUNTER — Other Ambulatory Visit: Payer: Self-pay

## 2019-12-20 MED ORDER — PRAMIPEXOLE DIHYDROCHLORIDE 1 MG PO TABS
1.0000 mg | ORAL_TABLET | Freq: Two times a day (BID) | ORAL | 5 refills | Status: DC
Start: 2019-12-20 — End: 2020-10-05

## 2019-12-20 NOTE — Telephone Encounter (Signed)
[facility] Triage Reviewed labs with patient and recommendations from Dr. Johney Frame. All questions answered. Richardson Dopp, PA-C    12/20/2019 9:15 AM

## 2019-12-20 NOTE — Telephone Encounter (Signed)
-----   Message from Freada Bergeron, MD sent at 12/17/2019  7:41 PM EDT ----- Triglycerides are still very elevated a 1,170. She needs to start the new medications we prescribed today and follow-up in lipid clinic (already referred). We will repeat labs on the medications in 6-8weeks.

## 2019-12-20 NOTE — Telephone Encounter (Signed)
Rx refill sent.

## 2019-12-20 NOTE — Telephone Encounter (Signed)
Pt called requesting refill for Pramipexole @ [facility] [place] on file.

## 2019-12-21 ENCOUNTER — Telehealth: Payer: Self-pay | Admitting: Physician Assistant

## 2019-12-21 ENCOUNTER — Other Ambulatory Visit (HOSPITAL_COMMUNITY)
Admission: RE | Admit: 2019-12-21 | Discharge: 2019-12-21 | Disposition: A | Discharge status: Home / Self Care | Payer: 59 | Source: Ambulatory Visit | Attending: Cardiovascular Disease | Admitting: Cardiovascular Disease

## 2019-12-21 DIAGNOSIS — Z01812 Encounter for preprocedural laboratory examination: Secondary | ICD-10-CM | POA: Insufficient documentation

## 2019-12-21 DIAGNOSIS — Z20822 Contact with and (suspected) exposure to covid-19: Secondary | ICD-10-CM | POA: Diagnosis not present

## 2019-12-21 LAB — SARS CORONAVIRUS 2 (TAT 6-24 HRS): SARS Coronavirus 2: NEGATIVE

## 2019-12-21 NOTE — Telephone Encounter (Signed)
Pt called and said that she had a visit with cardiologist on 11/5. She wants teresa to go on my chart and see all that is going on with her.

## 2019-12-21 NOTE — Telephone Encounter (Signed)
Please review

## 2019-12-22 ENCOUNTER — Telehealth: Payer: Self-pay | Admitting: *Deleted

## 2019-12-22 NOTE — Telephone Encounter (Signed)
Pt contacted pre-catheterization scheduled at [facility] for: Thursday December 23, 2019 7:30 AM Verified arrival time and place: [facility]) at: 5:30 AM-this is time change   No solid food after midnight prior to cath, clear liquids until 5 AM day of procedure.  CONTRAST ALLERGY: 13 hour Prednisone and Benadryl Prep reviewed with patient: 12/22/19 Prednisone 50 mg 6:30 PM 12/23/19 Prednisone 50 mg 12 :30 AM 12/23/19 Prednisone 50 mg and Benadryl 50 mg just prior to leaving for hospital AM of procedure  Hold: Metformin-day of procedure and 48 hours post procedure Torsemide-AM of procedure  Except hold medications AM meds can be  taken pre-cath with sips of water including: ASA 81 mg   Confirmed patient has responsible adult to drive home post procedure and be with patient first 24 hours after arriving home: yes  You are allowed ONE visitor in the waiting room during the time you are at the hospital for your procedure. Both you and your visitor must wear a mask once you enter the hospital.       COVID-19 Pre-Screening Questions:  . In the past 14 days have you had a new cough, new headache, new nasal congestion, fever (100.4 or greater) unexplained body aches, new sore throat, or sudden loss of taste or sense of smell? no . In the past 14 days have you been around anyone with known Covid 19? no              Reviewed procedure/mask/visitor instructions, COVID-19 questions with patient.

## 2019-12-23 ENCOUNTER — Encounter (HOSPITAL_COMMUNITY): Payer: Self-pay | Admitting: Cardiovascular Disease

## 2019-12-23 ENCOUNTER — Ambulatory Visit (HOSPITAL_COMMUNITY)
Admission: RE | Admit: 2019-12-23 | Discharge: 2019-12-23 | Disposition: A | Discharge status: Home / Self Care | Payer: 59 | Attending: Cardiovascular Disease | Admitting: Cardiovascular Disease

## 2019-12-23 ENCOUNTER — Encounter (HOSPITAL_COMMUNITY)
Admission: RE | Disposition: A | Discharge status: Home / Self Care | Payer: Self-pay | Source: Home / Self Care | Attending: Cardiovascular Disease

## 2019-12-23 DIAGNOSIS — Z888 Allergy status to other drugs, medicaments and biological substances status: Secondary | ICD-10-CM | POA: Diagnosis not present

## 2019-12-23 DIAGNOSIS — Z87891 Personal history of nicotine dependence: Secondary | ICD-10-CM | POA: Diagnosis not present

## 2019-12-23 DIAGNOSIS — R0602 Shortness of breath: Secondary | ICD-10-CM

## 2019-12-23 DIAGNOSIS — Z79899 Other long term (current) drug therapy: Secondary | ICD-10-CM | POA: Insufficient documentation

## 2019-12-23 DIAGNOSIS — Z79890 Hormone replacement therapy (postmenopausal): Secondary | ICD-10-CM | POA: Diagnosis not present

## 2019-12-23 DIAGNOSIS — R0683 Snoring: Secondary | ICD-10-CM | POA: Insufficient documentation

## 2019-12-23 DIAGNOSIS — Z6841 Body Mass Index (BMI) 40.0 and over, adult: Secondary | ICD-10-CM | POA: Diagnosis not present

## 2019-12-23 DIAGNOSIS — Z7984 Long term (current) use of oral hypoglycemic drugs: Secondary | ICD-10-CM | POA: Diagnosis not present

## 2019-12-23 DIAGNOSIS — E782 Mixed hyperlipidemia: Secondary | ICD-10-CM | POA: Diagnosis not present

## 2019-12-23 DIAGNOSIS — E781 Pure hyperglyceridemia: Secondary | ICD-10-CM | POA: Diagnosis not present

## 2019-12-23 DIAGNOSIS — Z8249 Family history of ischemic heart disease and other diseases of the circulatory system: Secondary | ICD-10-CM | POA: Insufficient documentation

## 2019-12-23 DIAGNOSIS — R000 Tachycardia, unspecified: Secondary | ICD-10-CM | POA: Insufficient documentation

## 2019-12-23 HISTORY — PX: RIGHT/LEFT HEART CATH AND CORONARY ANGIOGRAPHY: CATH118266

## 2019-12-23 LAB — POCT I-STAT EG7
Acid-base deficit: 2 mmol/L (ref 0.0–2.0)
Bicarbonate: 23.5 mmol/L (ref 20.0–28.0)
Calcium, Ion: 1.22 mmol/L (ref 1.15–1.40)
HCT: 37 % (ref 36.0–46.0)
Hemoglobin: 12.6 g/dL (ref 12.0–15.0)
O2 Saturation: 79 %
Potassium: 3.9 mmol/L (ref 3.5–5.1)
Sodium: 142 mmol/L (ref 135–145)
TCO2: 25 mmol/L (ref 22–32)
pCO2, Ven: 41.3 mmHg — ABNORMAL LOW (ref 44.0–60.0)
pH, Ven: 7.363 (ref 7.250–7.430)
pO2, Ven: 45 mmHg (ref 32.0–45.0)

## 2019-12-23 LAB — GLUCOSE, CAPILLARY
Glucose-Capillary: 281 mg/dL — ABNORMAL HIGH (ref 70–99)
Glucose-Capillary: 211 mg/dL — ABNORMAL HIGH (ref 70–99)

## 2019-12-23 LAB — POCT I-STAT 7, (LYTES, BLD GAS, ICA,H+H)
Acid-base deficit: 3 mmol/L — ABNORMAL HIGH (ref 0.0–2.0)
Bicarbonate: 22 mmol/L (ref 20.0–28.0)
Calcium, Ion: 1.23 mmol/L (ref 1.15–1.40)
HCT: 38 % (ref 36.0–46.0)
Hemoglobin: 12.9 g/dL (ref 12.0–15.0)
O2 Saturation: 98 %
Potassium: 4 mmol/L (ref 3.5–5.1)
Sodium: 142 mmol/L (ref 135–145)
TCO2: 23 mmol/L (ref 22–32)
pCO2 arterial: 38.6 mmHg (ref 32.0–48.0)
pH, Arterial: 7.363 (ref 7.350–7.450)
pO2, Arterial: 104 mmHg (ref 83.0–108.0)

## 2019-12-23 SURGERY — RIGHT/LEFT HEART CATH AND CORONARY ANGIOGRAPHY
Anesthesia: LOCAL

## 2019-12-23 MED ORDER — LIDOCAINE HCL (PF) 1 % IJ SOLN
INTRAMUSCULAR | Status: DC | PRN
  Administered 2019-12-23: 5 mL

## 2019-12-23 MED ORDER — ONDANSETRON HCL 4 MG/2ML IJ SOLN: 4.0000 mg | Freq: Four times a day (QID) | INTRAMUSCULAR | Status: DC | PRN

## 2019-12-23 MED ORDER — VERAPAMIL HCL 2.5 MG/ML IV SOLN
INTRAVENOUS | Status: AC
  Filled 2019-12-23: qty 2

## 2019-12-23 MED ORDER — HYDRALAZINE HCL 20 MG/ML IJ SOLN: 10.0000 mg | INTRAMUSCULAR | Status: DC | PRN

## 2019-12-23 MED ORDER — SODIUM CHLORIDE 0.9 % IV SOLN: INTRAVENOUS | Status: AC

## 2019-12-23 MED ORDER — SODIUM CHLORIDE 0.9 % IV SOLN: 250.0000 mL | INTRAVENOUS | Status: DC | PRN

## 2019-12-23 MED ORDER — SODIUM CHLORIDE 0.9% FLUSH: 3.0000 mL | Freq: Two times a day (BID) | INTRAVENOUS | Status: DC

## 2019-12-23 MED ORDER — HEPARIN (PORCINE) IN NACL 1000-0.9 UT/500ML-% IV SOLN
INTRAVENOUS | Status: AC
  Filled 2019-12-23: qty 500

## 2019-12-23 MED ORDER — LABETALOL HCL 5 MG/ML IV SOLN: 10.0000 mg | INTRAVENOUS | Status: DC | PRN

## 2019-12-23 MED ORDER — FENTANYL CITRATE (PF) 100 MCG/2ML IJ SOLN
INTRAMUSCULAR | Status: AC
  Filled 2019-12-23: qty 2

## 2019-12-23 MED ORDER — FENTANYL CITRATE (PF) 100 MCG/2ML IJ SOLN
INTRAMUSCULAR | Status: DC | PRN
  Administered 2019-12-23 (×2): 25 ug via INTRAVENOUS

## 2019-12-23 MED ORDER — SODIUM CHLORIDE 0.9% FLUSH: 3.0000 mL | INTRAVENOUS | Status: DC | PRN

## 2019-12-23 MED ORDER — MIDAZOLAM HCL 2 MG/2ML IJ SOLN
INTRAMUSCULAR | Status: DC | PRN
  Administered 2019-12-23 (×2): 1 mg via INTRAVENOUS

## 2019-12-23 MED ORDER — ASPIRIN 81 MG PO CHEW: 81.0000 mg | CHEWABLE_TABLET | ORAL | Status: AC

## 2019-12-23 MED ORDER — MIDAZOLAM HCL 2 MG/2ML IJ SOLN
INTRAMUSCULAR | Status: AC
  Filled 2019-12-23: qty 2

## 2019-12-23 MED ORDER — ACETAMINOPHEN 325 MG PO TABS: 650.0000 mg | ORAL_TABLET | ORAL | Status: DC | PRN

## 2019-12-23 MED ORDER — VERAPAMIL HCL 2.5 MG/ML IV SOLN
INTRAVENOUS | Status: DC | PRN
  Administered 2019-12-23: 10 mL via INTRA_ARTERIAL

## 2019-12-23 MED ORDER — LIDOCAINE HCL (PF) 1 % IJ SOLN
INTRAMUSCULAR | Status: AC
  Filled 2019-12-23: qty 30

## 2019-12-23 MED ORDER — HEPARIN SODIUM (PORCINE) 1000 UNIT/ML IJ SOLN
INTRAMUSCULAR | Status: AC
  Filled 2019-12-23: qty 1

## 2019-12-23 MED ORDER — HEPARIN (PORCINE) IN NACL 1000-0.9 UT/500ML-% IV SOLN
INTRAVENOUS | Status: DC | PRN
  Administered 2019-12-23 (×2): 500 mL

## 2019-12-23 MED ORDER — SODIUM CHLORIDE 0.9 % WEIGHT BASED INFUSION
3.0000 mL/kg/h | INTRAVENOUS | Status: AC
  Administered 2019-12-23: 3 mL/kg/h via INTRAVENOUS

## 2019-12-23 MED ORDER — HEPARIN SODIUM (PORCINE) 1000 UNIT/ML IJ SOLN
INTRAMUSCULAR | Status: DC | PRN
  Administered 2019-12-23: 5000 [IU] via INTRAVENOUS

## 2019-12-23 MED ORDER — SODIUM CHLORIDE 0.9 % WEIGHT BASED INFUSION: 1.0000 mL/kg/h | INTRAVENOUS | Status: DC

## 2019-12-23 MED ORDER — IOHEXOL 350 MG/ML SOLN
INTRAVENOUS | Status: DC | PRN
  Administered 2019-12-23: 150 mL

## 2019-12-23 SURGICAL SUPPLY — 14 items
CATH 5FR JL3.5 JR4 ANG PIG MP (CATHETERS) ×2 IMPLANT
CATH BALLN WEDGE 5F 110CM (CATHETERS) ×2 IMPLANT
CATH EXPO 5F MPA-1 (CATHETERS) ×2 IMPLANT
CATH INFINITI 5FR AL1 (CATHETERS) ×2 IMPLANT
DEVICE RAD COMP TR BAND LRG (VASCULAR PRODUCTS) ×2 IMPLANT
GLIDESHEATH SLEND SS 6F .021 (SHEATH) ×2 IMPLANT
GUIDEWIRE INQWIRE 1.5J.035X260 (WIRE) ×1 IMPLANT
INQWIRE 1.5J .035X260CM (WIRE) ×2
KIT HEART LEFT (KITS) ×2 IMPLANT
PACK CARDIAC CATHETERIZATION (CUSTOM PROCEDURE TRAY) ×2 IMPLANT
SHEATH GLIDE SLENDER 4/5FR (SHEATH) ×2 IMPLANT
SYR MEDRAD MARK 7 150ML (SYRINGE) ×2 IMPLANT
TRANSDUCER W/STOPCOCK (MISCELLANEOUS) ×2 IMPLANT
TUBING CIL FLEX 10 FLL-RA (TUBING) ×2 IMPLANT

## 2019-12-23 NOTE — Interval H&P Note (Signed)
History and Physical Interval Note:  12/23/2019 7:23 AM  [place]  has presented today for surgery, with the diagnosis of shortness of breath.  The various methods of treatment have been discussed with the patient and family. After consideration of risks, benefits and other options for treatment, the patient has consented to  Procedure(s): LEFT HEART CATH AND CORONARY ANGIOGRAPHY (N/A) as a surgical intervention.  The patient's history has been reviewed, patient examined, no change in status, stable for surgery.  I have reviewed the patient's chart and labs.  Questions were answered to the patient's satisfaction.   Cath Lab Visit (complete for each Cath Lab visit)  Clinical Evaluation Leading to the Procedure:   ACS: No.  Non-ACS:    Anginal Classification: CCS II  Anti-ischemic medical therapy: Minimal Therapy (1 class of medications)  Non-Invasive Test Results: No non-invasive testing performed  Prior CABG: No previous CABG        Lauree Chandler

## 2019-12-23 NOTE — Discharge Instructions (Signed)
Hold metformin for 48 hours post cath  DRINK PLENTY OF FLUIDS FOR THE NEXT 2-3 DAYS.  KEEP ARM ELEVATED THE REMAINDER OF THE DAY.   Radial Site Care  This sheet gives you information about how to care for yourself after your procedure. Your health care provider may also give you more specific instructions. If you have problems or questions, contact your health care provider. What can I expect after the procedure? After the procedure, it is common to have:  Bruising and tenderness at the catheter insertion area. Follow these instructions at home: Medicines  Take over-the-counter and prescription medicines only as told by your health care provider. Insertion site care 1. Follow instructions from your health care provider about how to take care of your insertion site. Make sure you: ? Wash your hands with soap and water before you change your bandage (dressing). If soap and water are not available, use hand sanitizer. ? Change your dressing as told by your health care provider. 2. Check your insertion site every day for signs of infection. Check for: ? Redness, swelling, or pain. ? Fluid or blood. ? Pus or a bad smell. ? Warmth. 3. Do not take baths, swim, or use a hot tub for 5 days. 4. You may shower 24-48 hours after the procedure. ? Remove the dressing and gently wash the site with plain soap and water. ? Pat the area dry with a clean towel. ? Do not rub the site. That could cause bleeding. 5. Do not apply powder or lotion to the site. Activity  1. For 24 hours after the procedure, or as directed by your health care provider: ? Do not flex or bend the affected arm. ? Do not push or pull heavy objects with the affected arm. ? Do not drive yourself home from the hospital or clinic. You may drive 24 hours after the procedure. ? Do not operate machinery or power tools. 2. Do not push, pull or lift anything that is heavier than 10 lb for 5 days. 3. Ask your health care provider  when it is okay to: ? Return to work or school. ? Resume usual physical activities or sports. ? Resume sexual activity. General instructions  If the catheter site starts to bleed, raise your arm and put firm pressure on the site. If the bleeding does not stop, get help right away. This is a medical emergency.  If you went home on the same day as your procedure, a responsible adult should be with you for the first 24 hours after you arrive home.  Keep all follow-up visits as told by your health care provider. This is important. Contact a health care provider if:  You have a fever.  You have redness, swelling, or yellow drainage around your insertion site. Get help right away if:  You have unusual pain at the radial site.  The catheter insertion area swells very fast.  The insertion area is bleeding, and the bleeding does not stop when you hold steady pressure on the area.  Your arm or hand becomes pale, cool, tingly, or numb. These symptoms may represent a serious problem that is an emergency. Do not wait to see if the symptoms will go away. Get medical help right away. Call your local emergency services (911 in the [place] Do not drive yourself to the hospital. Summary  After the procedure, it is common to have bruising and tenderness at the site.  Follow instructions from your health care provider about how  to take care of your radial site wound. Check the wound every day for signs of infection.  Do not push, pull or lift anything that is heavier than 10 lb for 5 days.  This information is not intended to replace advice given to you by your health care provider. Make sure you discuss any questions you have with your health care provider. Document Revised: 03/05/2017 Document Reviewed: 03/05/2017 Elsevier Patient Education  2020 [employer].

## 2019-12-27 ENCOUNTER — Other Ambulatory Visit: Payer: Self-pay

## 2019-12-27 ENCOUNTER — Inpatient Hospital Stay (HOSPITAL_COMMUNITY): Payer: 59 | Attending: Hematology

## 2019-12-27 DIAGNOSIS — Z818 Family history of other mental and behavioral disorders: Secondary | ICD-10-CM | POA: Diagnosis not present

## 2019-12-27 DIAGNOSIS — Z808 Family history of malignant neoplasm of other organs or systems: Secondary | ICD-10-CM | POA: Insufficient documentation

## 2019-12-27 DIAGNOSIS — R5383 Other fatigue: Secondary | ICD-10-CM | POA: Insufficient documentation

## 2019-12-27 DIAGNOSIS — Z8249 Family history of ischemic heart disease and other diseases of the circulatory system: Secondary | ICD-10-CM | POA: Insufficient documentation

## 2019-12-27 DIAGNOSIS — F319 Bipolar disorder, unspecified: Secondary | ICD-10-CM | POA: Diagnosis not present

## 2019-12-27 DIAGNOSIS — D72825 Bandemia: Secondary | ICD-10-CM

## 2019-12-27 DIAGNOSIS — Z836 Family history of other diseases of the respiratory system: Secondary | ICD-10-CM | POA: Diagnosis not present

## 2019-12-27 DIAGNOSIS — K219 Gastro-esophageal reflux disease without esophagitis: Secondary | ICD-10-CM | POA: Diagnosis not present

## 2019-12-27 DIAGNOSIS — L03311 Cellulitis of abdominal wall: Secondary | ICD-10-CM | POA: Insufficient documentation

## 2019-12-27 DIAGNOSIS — E669 Obesity, unspecified: Secondary | ICD-10-CM | POA: Diagnosis not present

## 2019-12-27 DIAGNOSIS — R0602 Shortness of breath: Secondary | ICD-10-CM | POA: Insufficient documentation

## 2019-12-27 DIAGNOSIS — Z833 Family history of diabetes mellitus: Secondary | ICD-10-CM | POA: Insufficient documentation

## 2019-12-27 DIAGNOSIS — Z87891 Personal history of nicotine dependence: Secondary | ICD-10-CM | POA: Diagnosis not present

## 2019-12-27 DIAGNOSIS — E785 Hyperlipidemia, unspecified: Secondary | ICD-10-CM | POA: Insufficient documentation

## 2019-12-27 DIAGNOSIS — Z809 Family history of malignant neoplasm, unspecified: Secondary | ICD-10-CM | POA: Diagnosis not present

## 2019-12-27 DIAGNOSIS — Z800 Family history of malignant neoplasm of digestive organs: Secondary | ICD-10-CM | POA: Diagnosis not present

## 2019-12-27 DIAGNOSIS — Z79899 Other long term (current) drug therapy: Secondary | ICD-10-CM | POA: Insufficient documentation

## 2019-12-27 DIAGNOSIS — Z811 Family history of alcohol abuse and dependence: Secondary | ICD-10-CM | POA: Diagnosis not present

## 2019-12-27 LAB — CBC WITH DIFFERENTIAL/PLATELET
Abs Immature Granulocytes: 0.46 10*3/uL — ABNORMAL HIGH (ref 0.00–0.07)
Basophils Absolute: 0.1 10*3/uL (ref 0.0–0.1)
Basophils Relative: 1 %
Eosinophils Absolute: 0.5 10*3/uL (ref 0.0–0.5)
Eosinophils Relative: 3 %
HCT: 38.2 % (ref 36.0–46.0)
Hemoglobin: 12.5 g/dL (ref 12.0–15.0)
Immature Granulocytes: 3 %
Lymphocytes Relative: 22 %
Lymphs Abs: 3.1 10*3/uL (ref 0.7–4.0)
MCH: 29.5 pg (ref 26.0–34.0)
MCHC: 32.7 g/dL (ref 30.0–36.0)
MCV: 90.1 fL (ref 80.0–100.0)
Monocytes Absolute: 1 10*3/uL (ref 0.1–1.0)
Monocytes Relative: 7 %
Neutro Abs: 9.1 10*3/uL — ABNORMAL HIGH (ref 1.7–7.7)
Neutrophils Relative %: 64 %
Platelets: 393 10*3/uL (ref 150–400)
RBC: 4.24 MIL/uL (ref 3.87–5.11)
RDW: 14.5 % (ref 11.5–15.5)
WBC: 14.1 10*3/uL — ABNORMAL HIGH (ref 4.0–10.5)
nRBC: 0 % (ref 0.0–0.2)

## 2019-12-27 LAB — LACTATE DEHYDROGENASE: LDH: 135 U/L (ref 98–192)

## 2019-12-28 ENCOUNTER — Other Ambulatory Visit (INDEPENDENT_AMBULATORY_CARE_PROVIDER_SITE_OTHER): Payer: Self-pay | Admitting: Family Medicine

## 2019-12-28 ENCOUNTER — Ambulatory Visit: Payer: 59 | Admitting: Gastroenterology

## 2019-12-28 ENCOUNTER — Other Ambulatory Visit: Payer: Self-pay

## 2019-12-28 ENCOUNTER — Ambulatory Visit (INDEPENDENT_AMBULATORY_CARE_PROVIDER_SITE_OTHER): Payer: 59 | Admitting: Physician Assistant

## 2019-12-28 ENCOUNTER — Encounter: Payer: Self-pay | Admitting: Physician Assistant

## 2019-12-28 DIAGNOSIS — Z634 Disappearance and death of family member: Secondary | ICD-10-CM | POA: Diagnosis not present

## 2019-12-28 DIAGNOSIS — R454 Irritability and anger: Secondary | ICD-10-CM | POA: Diagnosis not present

## 2019-12-28 DIAGNOSIS — F319 Bipolar disorder, unspecified: Secondary | ICD-10-CM | POA: Diagnosis not present

## 2019-12-28 DIAGNOSIS — F5105 Insomnia due to other mental disorder: Secondary | ICD-10-CM

## 2019-12-28 DIAGNOSIS — F99 Mental disorder, not otherwise specified: Secondary | ICD-10-CM

## 2019-12-28 DIAGNOSIS — Z79899 Other long term (current) drug therapy: Secondary | ICD-10-CM

## 2019-12-28 DIAGNOSIS — E1165 Type 2 diabetes mellitus with hyperglycemia: Secondary | ICD-10-CM

## 2019-12-28 DIAGNOSIS — T50905A Adverse effect of unspecified drugs, medicaments and biological substances, initial encounter: Secondary | ICD-10-CM

## 2019-12-28 DIAGNOSIS — E032 Hypothyroidism due to medicaments and other exogenous substances: Secondary | ICD-10-CM

## 2019-12-28 DIAGNOSIS — R635 Abnormal weight gain: Secondary | ICD-10-CM

## 2019-12-28 MED ORDER — OXCARBAZEPINE 300 MG PO TABS: ORAL_TABLET | ORAL | 1 refills | Status: DC

## 2019-12-28 MED ORDER — SERTRALINE HCL 100 MG PO TABS: 150.0000 mg | ORAL_TABLET | Freq: Every day | ORAL | 11 refills | Status: DC

## 2019-12-28 NOTE — Patient Instructions (Addendum)
Wean off the Seroquel XR 400 mg as follows: Take 2 pills every night for 1 week,  Then 1-1/2 pills every night for 1 week, Then one half p.o. every night for 1 week and then stop If you have trouble sleeping after stopping it, call.    Wean off the pramipexole 1 mg as follows: Take 1 pill in the morning and 1/2 pill in the evening for 1 week then take 1/2 pill twice a day for 1 week and then take 1/2 pill once a day for a week and then stop it

## 2019-12-28 NOTE — Progress Notes (Signed)
[facility]  Patient ID: Kristen [name],  MRN: [id number]  PCP: Manon Hilding, MD  Date of Evaluation: 12/28/2019 Time spent:45 minutes  Chief Complaint:  Chief Complaint    Anxiety; Depression; Insomnia      HISTORY/CURRENT STATUS: HPI For routine med check.  Is still grieving for her Mom. Is in grief supports groups, seeing a grief counselor, goes to the [place] a lot. Is getting CBT but she is not sure how much that is helping her yet.  States she is depressed. Her stepdad swore nothing would change after her Mom died, but it has. Under a lot of stress. Son in [place] in [place], he has been charged with assault of his girlfriend.  Danessa had to go to [place] since our last visit so she could be with him.  So that has been a huge stressor as well.  Her husband has been out of work since the spring due to health reasons.  So they are not only stressed because of the deaths in her family, problems with her son but also financially because of her husband's situation.  States she is not suicidal but there are days when she would not care if she went to sleep and did not wake up.  She recently saw cardiology and is having a thorough work-up to rule out heart disease.  She has been having shortness of breath and I believe palpitations for several months.  She feels like it is just anxiety but will be glad to have the work-up and be reassured that it is not nothing serious going on with her heart.  Her triglycerides are very high, over 1000, so new cholesterol medications have been added within the past month or so.  She has been binge eating.  States she does that when she is stressed.  She is gotten to the point that she will eat Little Debbie cakes and hide the wrappers so her husband or son will not realize she is eating so poorly.  She has gained about 30 pounds in the past 2 to 3 months.  She is having a hard time enjoying anything.  Energy and motivation are still  low.  She has started isolating a lot.  Not crying as much as she did but she does still cry.  Anxiety is well controlled with the Xanax.  Still not feeling rested when she gets up.  She usually wakes up early in the mornings, but then is ready to go back to bed down 10 and will sleep sometimes to 4 in the afternoon.  States she wants to stay in bed so she might have to deal with all of the stressors.  No suicidal or homicidal thoughts.  Patient denies increased energy with decreased need for sleep, no increased talkativeness, no racing thoughts, no impulsivity or risky behaviors, no increased spending, no increased libido, no grandiosity, no paranoia, and no hallucinations.  Does report increased anger and irritability.  She has a short fuse.  Denies dizziness, syncope, seizures, numbness, tingling, tremor, tics, unsteady gait, slurred speech, confusion. Denies muscle or joint pain, stiffness, or dystonia.  Individual Medical History/ Review of Systems: Changes? :Yes  see HPI  Past medications for mental health diagnoses include: Prozac, Paxil, Lexapro, Celexa, Zoloft, Viibryd, Effexor, Cymbalta, Risperdal, Latuda, Lamictal, VPA, Lithium-became toxic,Xanax, Wellbutrin, Abilify, Willette Alma, Strattera, Kapvay, Saphris, Adderall, Vyvanse, Lunesta, Sonata,  Vraylar, Seroquel, Geodon, Tegretal  Allergies: Iohexol  Current Medications:  Current Outpatient Medications:  .  acetaminophen (TYLENOL) 500 MG tablet, Take 2,000 mg by mouth every 6 (six) hours as needed (pain.)., Disp: , Rfl:  .  ALPRAZolam (XANAX) 1 MG tablet, TAKE 1 TABLET BY MOUTH THREE TIMES DAILY AS NEEDED FOR ANXIETY (Patient taking differently: Take 1 mg by mouth 3 (three) times daily as needed for anxiety. ), Disp: 90 tablet, Rfl: 0 .  aspirin EC 81 MG tablet, Take 1 tablet (81 mg total) by mouth daily. Swallow whole., Disp: 30 tablet, Rfl: 11 .  buPROPion (WELLBUTRIN XL) 150 MG 24 hr tablet, TAKE 3 TABLETS BY MOUTH IN THE MORNING (Patient  taking differently: Take 450 mg by mouth daily. ), Disp: 90 tablet, Rfl: 0 .  cetirizine (ZYRTEC) 10 MG tablet, Take 10 mg by mouth daily. , Disp: , Rfl:  .  diphenhydrAMINE (BENADRYL) 50 MG tablet, Take one tablet by mouth 7:30 am on 12/23/19 (Patient taking differently: Take 25 mg by mouth every 8 (eight) hours as needed for allergies. Take one tablet by mouth 7:30 am on 12/23/19), Disp: 1 tablet, Rfl: 0 .  ezetimibe (ZETIA) 10 MG tablet, Take 1 tablet (10 mg total) by mouth daily., Disp: 90 tablet, Rfl: 3 .  fluorometholone (FML) 0.1 % ophthalmic suspension, Place 1 drop into both eyes 4 (four) times daily as needed (dry/irritated eyes.)., Disp: , Rfl:  .  fluticasone (FLONASE) 50 MCG/ACT nasal spray, Place 2 sprays into both nostrils daily as needed for allergies or rhinitis. , Disp: , Rfl:  .  icosapent Ethyl (VASCEPA) 1 g capsule, Take 2 capsules (2 g total) by mouth 2 (two) times daily., Disp: 360 capsule, Rfl: 3 .  lamoTRIgine (LAMICTAL) 200 MG tablet, Take 2.5 tablets (500 mg total) by mouth daily. (Patient taking differently: Take 500 mg by mouth at bedtime. ), Disp: 75 tablet, Rfl: 5 .  levocetirizine (XYZAL) 5 MG tablet, Take 5 mg by mouth at bedtime. , Disp: , Rfl:  .  levothyroxine (SYNTHROID) 75 MCG tablet, Take 75 mcg by mouth daily before breakfast. , Disp: , Rfl:  .  LINZESS 290 MCG CAPS capsule, TAKE 1 CAPSULE BY MOUTH ONCE DAILY BEFORE BREAKFAST (Patient taking differently: Take 290 mcg by mouth daily before breakfast. ), Disp: 30 capsule, Rfl: 11 .  meclizine (ANTIVERT) 25 MG tablet, Take 25 mg by mouth 3 (three) times daily as needed for dizziness., Disp: , Rfl:  .  metFORMIN (GLUCOPHAGE) 500 MG tablet, Take 1 tablet (500 mg total) by mouth 2 (two) times daily with a meal., Disp: 60 tablet, Rfl: 0 .  metoprolol succinate (TOPROL-XL) 25 MG 24 hr tablet, Take 25 mg by mouth in the morning and at bedtime. , Disp: , Rfl:  .  montelukast (SINGULAIR) 10 MG tablet, Take 10 mg by mouth  daily. , Disp: , Rfl:  .  ondansetron (ZOFRAN) 4 MG tablet, TAKE 1 TABLET BY MOUTH EVERY 6 HOURS AS NEEDED FOR NAUSEA OR VOMITING (Patient taking differently: Take 4 mg by mouth every 6 (six) hours as needed for nausea or vomiting. ), Disp: 30 tablet, Rfl: 3 .  pantoprazole (PROTONIX) 40 MG tablet, Take 1 tablet (40 mg total) by mouth 2 (two) times daily with a meal., Disp: 180 tablet, Rfl: 3 .  polyethylene glycol (MIRALAX / GLYCOLAX) 17 g packet, Take 17 g by mouth 2 (two) times daily. (Patient taking differently: Take 17 g by mouth daily as needed (constipation). ), Disp: 60 each, Rfl: 1 .  pramipexole (MIRAPEX) 1 MG tablet, Take 1 tablet (  1 mg total) by mouth 2 (two) times daily. (Patient taking differently: Take 2 mg by mouth at bedtime. ), Disp: 60 tablet, Rfl: 5 .  QUEtiapine (SEROQUEL XR) 400 MG 24 hr tablet, Take 3 tablets (1,200 mg total) by mouth at bedtime. Take 2.5 pills qhs (Patient taking differently: Take 1,000 mg by mouth at bedtime. ), Disp: 75 tablet, Rfl: 5 .  rosuvastatin (CRESTOR) 20 MG tablet, Take 1 tablet (20 mg total) by mouth daily., Disp: 90 tablet, Rfl: 3 .  sertraline (ZOLOFT) 100 MG tablet, Take 1.5 tablets (150 mg total) by mouth at bedtime., Disp: 45 tablet, Rfl: 11 .  torsemide (DEMADEX) 20 MG tablet, Take 20 mg by mouth daily., Disp: , Rfl:  .  trimethoprim (TRIMPEX) 100 MG tablet, Take 100 mg by mouth daily., Disp: , Rfl:  .  Vitamin D, Ergocalciferol, (DRISDOL) 1.25 MG (50000 UNIT) CAPS capsule, Take 1 capsule (50,000 Units total) by mouth every 7 (seven) days. (Patient taking differently: Take 50,000 Units by mouth every Friday at 6 PM. ), Disp: 4 capsule, Rfl: 0 .  zaleplon (SONATA) 10 MG capsule, Take 1 qhs prn and may repeat 1 for midnocturnal awakening prn if she has 3 hours left to sleep (Patient taking differently: Take 10 mg by mouth at bedtime as needed and may repeat dose one time if needed for sleep. ), Disp: 60 capsule, Rfl: 5 .  Oxcarbazepine (TRILEPTAL)  300 MG tablet, 1 po qhs for 4 nights, then 1 po bid., Disp: 60 tablet, Rfl: 1 .  predniSONE (DELTASONE) 50 MG tablet, Take one tablet by mouth at 9:30 pm on 12/22/19 (13 hours prior to procedure), take one tablet by mouth at 3:30 am on 12/23/19 (7 hours prior to procedure), take one tablet by mouth when 7:30 am on 12/23/19. (Patient not taking: Reported on 12/28/2019), Disp: 3 tablet, Rfl: 0 .  Pseudoephedrine-Guaifenesin (MUCINEX D MAX STRENGTH) [phone] MG TB12, Take 1-2 tablets by mouth 2 (two) times daily as needed (cought/congestion.).  (Patient not taking: Reported on 12/28/2019), Disp: , Rfl:  Medication Side Effects: none  Family Medical/ Social History: Changes?  See above  MENTAL HEALTH EXAM:  There were no vitals taken for this visit.There is no height or weight on file to calculate BMI.  General Appearance: Casual, Well Groomed and Obese  Eye Contact:  Good  Speech:  Clear and Coherent and Normal Rate  Volume:  Normal  Mood:  Anxious and Irritable  Affect:  Anxious  Thought Process:  Goal Directed and Descriptions of Associations: Intact  Orientation:  Full (Time, Place, and Person)  Thought Content: Logical   Suicidal Thoughts:  No  Homicidal Thoughts:  No  Memory:  WNL  Judgement:  Good  Insight:  Good  Psychomotor Activity:  Normal  Concentration:  Concentration: Good and Attention Span: Good  Recall:  Good  Fund of Knowledge: Good  Language: Good  Assets:  Desire for Improvement  ADL's:  Intact  Cognition: WNL  Prognosis:  Good   Most recent pertinent labs: 12/17/2019 Lipid panel shows cholesterol of 358, triglycerides 1170, HDL 41, LDL was unable to be calculated due to the extremely high triglycerides. BMP shows glucose of 130, BUN and creatinine were within normal limits as were sodium, potassium.  DIAGNOSES:    ICD-10-CM   1. Bipolar disorder with depression ([place])  F31.9   2. Bereavement  Z63.4   3. Irritability and anger  R45.4   4. 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 Weight gain due to medication  R63.5    T50.905A   7. Polypharmacy  Z79.899     Receiving Psychotherapy: Yes  Felix Ahmadi   RECOMMENDATIONS:  PDMP was reviewed. I provided 45 minutes of face to face time during this encounter, including time spent reviewing her labs as well as note from cardiology. Long discussion about meds.  She would like to get off of anything that she does not absolutely need.  She is understandably frustrated that she has to take so many pills.  We specifically discussed the pramipexole.  That was given several years ago which I think was for restless leg syndrome but she does not remember having been diagnosed with that.    I think we can wean off of that, but of course we will have to watch for recurrence and RLS.  Another drug that should be weaned if Seroquel.  It is not helping her symptoms at all anymore plus she has gained a significant amount of weight and has hyper lipidemia and elevated glucoses all of which can be related to any antipsychotic. We changed to Zoloft at the last visit and she feels that that is working well even though she is still very depressed it is better than the Prozac.  Because she is still so depressed and grieving I think we should increase the Zoloft. Increase Zoloft to 150 mg/d Wean off  Seroquel XR 400 mg, taking 2 pills every night for 1 week, then 1.5 pills every night for 1 week, then 1 pill every night for 1 week then one half nightly for 1 week and then stop.  She knows this can cause insomnia and will call if that occurs. Wean off pramipexole 1 mg by taking 1 pill in the morning and 1.5 pills in the evening for 1 week then one half p.o. twice daily for 1 week, then one half p.o. daily for 1 week and then stop. Continue Xanax 1 mg, 1 p.o. 3 times daily as needed anxiety.   Continue Wellbutrin XL 150 mg, 3 p.o. every morning. Continue Lamictal 200  mg, 2.5 pills daily. Continue Sonata 10 mg, 1 p.o. nightly as needed and may repeat 1 as needed for mid nocturnal awakening as long as she has 3 hours left to sleep. Continue counseling. Return in 4 weeks.  [name], PA-C

## 2019-12-29 ENCOUNTER — Ambulatory Visit (INDEPENDENT_AMBULATORY_CARE_PROVIDER_SITE_OTHER): Payer: Medicare Other | Admitting: Physician Assistant

## 2019-12-31 ENCOUNTER — Other Ambulatory Visit (INDEPENDENT_AMBULATORY_CARE_PROVIDER_SITE_OTHER): Payer: Self-pay | Admitting: Family Medicine

## 2019-12-31 DIAGNOSIS — E1165 Type 2 diabetes mellitus with hyperglycemia: Secondary | ICD-10-CM

## 2020-01-03 ENCOUNTER — Other Ambulatory Visit: Payer: Self-pay

## 2020-01-03 ENCOUNTER — Inpatient Hospital Stay (HOSPITAL_BASED_OUTPATIENT_CLINIC_OR_DEPARTMENT_OTHER): Payer: 59 | Admitting: Hematology

## 2020-01-03 DIAGNOSIS — D72825 Bandemia: Secondary | ICD-10-CM

## 2020-01-03 NOTE — Patient Instructions (Signed)
[facility] at [facility] Discharge Instructions  You were seen today by Dr. Katragadda. He went over your recent results. Dr. Katragadda will see you back in 6 months for labs and follow up.   Thank you for choosing [facility] at [facility] to provide your oncology and hematology care.  To afford each patient quality time with our provider, please arrive at least 15 minutes before your scheduled appointment time.   If you have a lab appointment with the Cancer Center please come in thru the Main Entrance and check in at the main information desk  You need to re-schedule your appointment should you arrive 10 or more minutes late.  We strive to give you quality time with our providers, and arriving late affects you and other patients whose appointments are after yours.  Also, if you no show three or more times for appointments you may be dismissed from the clinic at the providers discretion.     Again, thank you for choosing [facility].  Our hope is that these requests will decrease the amount of time that you wait before being seen by our physicians.       _____________________________________________________________  Should you have questions after your visit to [facility], please contact our office at [phone] between the hours of 8:00 a.m. and 4:30 p.m.  Voicemails left after 4:00 p.m. will not be returned until the following business day.  For prescription refill requests, have your pharmacy contact our office and allow 72 hours.    Cancer Center Support Programs:   > Cancer Support Group  2nd Tuesday of the month 1pm-2pm, Journey Room    

## 2020-01-03 NOTE — Progress Notes (Signed)
Kristen [name], Kristen [name] [id number]   CLINIC:  Medical Oncology/Hematology  PCP:  Manon Hilding, MD [address] [place] [address]  [phone]  REASON FOR VISIT:  Follow-up for bandemia  PRIOR THERAPY: None  CURRENT THERAPY: Observation  INTERVAL HISTORY:  [name], a 49 y.o. 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 Kristen [name] was last seen on 06/29/2019.  Today she reports feeling okay. She had a heart cath on 11/18 for SOB. She was also put on Rocephin and Keflex for her UTI. She also has cellulitis on her lower abdomen which forms into blisters and erupt. She is not currently on any inhalers or steroids; she uses Flonase PRN. She was diagnosed with diabetes on 11/16 and was started on metformin. She denies having any recent cough, F/C, night sweats or unexplained weight loss, though her SOB is stable.  She quit smoking in 2003. She was recently started with the lipid center at [facility].   REVIEW OF SYSTEMS:  Review of Systems  Constitutional: Positive for fatigue (50%). Negative for appetite change, chills, diaphoresis, fever and unexpected weight change.  Respiratory: Positive for shortness of breath. Negative for cough.   Genitourinary: Positive for difficulty urinating (recent UTI).   Skin: Positive for rash (cellulitis on lower abdomen on 11/22).  Psychiatric/Behavioral: Positive for depression.  All other systems reviewed and are negative.   PAST MEDICAL/SURGICAL HISTORY:  Past Medical History:  Diagnosis Date  . Allergies   . Anemia   . Anxiety   . Arthritis   . Atypical mole 11/03/2006   mid upper back (slight to moderate)  . Atypical mole 11/03/2006   lower right back (slight to moderate)  . Back pain   . Bipolar 1 disorder ([place])   . Borderline diabetic   . Chronic constipation   . Constipation   . Depression   . Family history of adverse reaction to anesthesia    mother-- ponv  . Fatty  liver   . GAD (generalized anxiety disorder)   . GERD (gastroesophageal reflux disease)   . Hiatal hernia   . High triglycerides   . History of kidney stones   . History of recurrent UTIs   . History of sepsis 06/2018  . History of stomach ulcers   . History of suicidal ideation   . Hyperlipidemia   . Hypothyroidism   . IDA (iron deficiency anemia)   . Joint pain   . Melanoma ([place]) 11/03/2006   left chest in situ (excision)  . Orthostasis   . Palpitations   . PONV (postoperative nausea and vomiting)   . Prediabetes   . Rapid heart rate   . Renal calculus, left   . Seasonal allergies   . Shortness of breath   . Sleep disorder, unspecified    per excessive sleeping during the day  . Snoring   . Swallowing difficulty   . Tachycardia   . Vertigo   . Vitamin D deficiency   . Weakness   . Wears contact lenses    Past Surgical History:  Procedure Laterality Date  . ANTERIOR CERVICAL DECOMP/DISCECTOMY FUSION  02/17/2012   Procedure: ANTERIOR CERVICAL DECOMPRESSION/DISCECTOMY FUSION 2 LEVELS;  Surgeon: Hosie Spangle, MD;  Location: [facility];  Service: Neurosurgery;  Laterality: N/A;  Cervical four-five and Cervical six-seven anterior cervial decompression with fusion plating and bonegraft  . ANTERIOR CERVICAL DECOMP/DISCECTOMY FUSION  04-01-2001   _0   C5---6  . BIOPSY  07/20/2018   Procedure: BIOPSY;  Surgeon: Daneil Dolin, MD;  Location: [facility];  Service: Endoscopy;;  gastric   . BREAST REDUCTION SURGERY Bilateral 1995  . CESAREAN SECTION  x2   last one 1998   with BILATERAL TUBAL LIGATION  . CESAREAN SECTION WITH BILATERAL TUBAL LIGATION    . COLONOSCOPY WITH PROPOFOL N/A 07/20/2018   Dr. Gala Romney: Diverticulosis, internal hemorrhoids, 5 mm splenic flexure tubular adenoma removed.  . CYSTOSCOPY/URETEROSCOPY/HOLMIUM LASER/STENT PLACEMENT Left 08/21/2018   Procedure: CYSTOSCOPY LEFT RETROGRADE PYELOGRAM /URETEROSCOPY/HOLMIUM LASER/STENT PLACEMENT;  Surgeon: Ceasar Mons, MD;  Location: [facility];  Service: Urology;  Laterality: Left;  . DILATION AND CURETTAGE OF UTERUS  1992   for miscarriage  . ENDOMETRIAL ABLATION  2014  . ESOPHAGOGASTRODUODENOSCOPY     20 years ago.   Marland Kitchen ESOPHAGOGASTRODUODENOSCOPY (EGD) WITH PROPOFOL N/A 07/20/2018   Dr. Gala Romney: Small hiatal hernia, erythematous mucosa in the stomach with a healing gastric ulcer measuring 1 cm, biopsies negative.  Marland [place]   unilateral for infertility  . NASAL SINUS SURGERY  2010  approx.  Marland Kitchen POLYPECTOMY  07/20/2018   Procedure: POLYPECTOMY;  Surgeon: Daneil Dolin, MD;  [facility]: [facility];  Service: Endoscopy;;  . POSTERIOR CERVICAL FUSION/FORAMINOTOMY N/A 08/18/2013   Procedure: CERVICAL FOUR TO CERVICAL SEVEN POSTERIOR CERVICAL FUSION/FORAMINOTOMY LEVEL 3;  Surgeon: Hosie Spangle, MD;  Location: [facility];  Service: Neurosurgery;  Laterality: N/A;  C4-7 posterior cervical fusion with lateral mass fixation  . RIGHT/LEFT HEART CATH AND CORONARY ANGIOGRAPHY N/A 12/23/2019   Procedure: RIGHT/LEFT HEART CATH AND CORONARY ANGIOGRAPHY;  Surgeon: Burnell Blanks, MD;  Location: [facility];  Service: Cardiovascular;  Laterality: N/A;    SOCIAL HISTORY:  Social History   Socioeconomic History  . Marital status: Married    Spouse name: Not on file  . Number of children: 2  . Years of education: Not on file  . Highest education level: Not on file  Occupational History  . Occupation: disability  Tobacco Use  . Smoking status: Former Smoker    Packs/day: 1.00    Years: 5.00    Pack years: 5.00    Types: Cigarettes    Quit date: 02/09/2002    Years since quitting: 17.9  . Smokeless tobacco: Never Used  Vaping Use  . Vaping Use: Never used  Substance and Sexual Activity  . Alcohol use: No  . Drug use: No  . Sexual activity: Yes    Birth control/protection: Surgical  Other Topics Concern  . Not on file  Social History  Narrative   Step brother-2   Step sister-2   Half sister -1 healthy   Social Determinants of Health   Financial Resource Strain:   . Difficulty of Paying Living Expenses: Not on file  Food Insecurity:   . Worried About Charity fundraiser in the Last Year: Not on file  . Ran Out of Food in the Last Year: Not on file  Transportation Needs:   . Lack of Transportation (Medical): Not on file  . Lack of Transportation (Non-Medical): Not on file  Physical Activity:   . Days of Exercise per Week: Not on file  . Minutes of Exercise per Session: Not on file  Stress:   . Feeling of Stress : Not on file  Social Connections:   . Frequency of Communication with Friends and Family: Not on file  . Frequency  of Social Gatherings with Friends and Family: Not on file  . Attends Religious Services: Not on file  . Active Member of Clubs or Organizations: Not on file  . Attends Archivist Meetings: Not on file  . Marital Status: Not on file  Intimate Partner Violence:   . Fear of Current or Ex-Partner: Not on file  . Emotionally Abused: Not on file  . Physically Abused: Not on file  . Sexually Abused: Not on file    FAMILY HISTORY:  Family History  Problem Relation Age of Onset  . Asthma Mother   . Rheum arthritis Mother   . Non-Hodgkin's lymphoma Mother   . Congestive Heart Failure Mother   . Atrial fibrillation Mother   . Diabetes Mother   . Hyperlipidemia Mother   . Cancer Mother   . Colon cancer Father   . Multiple myeloma Father   . Hypertension Father   . Cancer Father   . Depression Father   . Anxiety disorder Father   . Alcoholism Father   . Obesity Father   . Allergies Other        entire family(mom and dad)  . Diabetes Maternal Grandmother   . Congestive Heart Failure Maternal Grandmother   . Mental illness Paternal Grandmother   . Colon cancer Paternal Grandfather   . Bone cancer Paternal Grandfather     CURRENT MEDICATIONS:  Current Outpatient  Medications  Medication Sig Dispense Refill  . acetaminophen (TYLENOL) 500 MG tablet Take 2,000 mg by mouth every 6 (six) hours as needed (pain.).    Marland Kitchen ALPRAZolam (XANAX) 1 MG tablet TAKE 1 TABLET BY MOUTH THREE TIMES DAILY AS NEEDED FOR ANXIETY (Patient taking differently: Take 1 mg by mouth 3 (three) times daily as needed for anxiety. ) 90 tablet 0  . aspirin EC 81 MG tablet Take 1 tablet (81 mg total) by mouth daily. Swallow whole. 30 tablet 11  . buPROPion (WELLBUTRIN XL) 150 MG 24 hr tablet TAKE 3 TABLETS BY MOUTH IN THE MORNING (Patient taking differently: Take 450 mg by mouth daily. ) 90 tablet 0  . cetirizine (ZYRTEC) 10 MG tablet Take 10 mg by mouth daily.     . diphenhydrAMINE (BENADRYL) 50 MG tablet Take one tablet by mouth 7:30 am on 12/23/19 (Patient taking differently: Take 25 mg by mouth every 8 (eight) hours as needed for allergies. Take one tablet by mouth 7:30 am on 12/23/19) 1 tablet 0  . ezetimibe (ZETIA) 10 MG tablet Take 1 tablet (10 mg total) by mouth daily. 90 tablet 3  . fluorometholone (FML) 0.1 % ophthalmic suspension Place 1 drop into both eyes 4 (four) times daily as needed (dry/irritated eyes.).    Marland Kitchen fluticasone (FLONASE) 50 MCG/ACT nasal spray Place 2 sprays into both nostrils daily as needed for allergies or rhinitis.     Marland Kitchen icosapent Ethyl (VASCEPA) 1 g capsule Take 2 capsules (2 g total) by mouth 2 (two) times daily. 360 capsule 3  . lamoTRIgine (LAMICTAL) 200 MG tablet Take 2.5 tablets (500 mg total) by mouth daily. (Patient taking differently: Take 500 mg by mouth at bedtime. ) 75 tablet 5  . levocetirizine (XYZAL) 5 MG tablet Take 5 mg by mouth at bedtime.     Marland Kitchen levothyroxine (SYNTHROID) 75 MCG tablet Take 75 mcg by mouth daily before breakfast.     . LINZESS 290 MCG CAPS capsule TAKE 1 CAPSULE BY MOUTH ONCE DAILY BEFORE BREAKFAST (Patient taking differently: Take 290 mcg by mouth  daily before breakfast. ) 30 capsule 11  . meclizine (ANTIVERT) 25 MG tablet Take 25  mg by mouth 3 (three) times daily as needed for dizziness.    . metFORMIN (GLUCOPHAGE) 500 MG tablet Take 1 tablet (500 mg total) by mouth 2 (two) times daily with a meal. 60 tablet 0  . metoprolol succinate (TOPROL-XL) 25 MG 24 hr tablet Take 25 mg by mouth in the morning and at bedtime.     . montelukast (SINGULAIR) 10 MG tablet Take 10 mg by mouth daily.     . ondansetron (ZOFRAN) 4 MG tablet TAKE 1 TABLET BY MOUTH EVERY 6 HOURS AS NEEDED FOR NAUSEA OR VOMITING (Patient taking differently: Take 4 mg by mouth every 6 (six) hours as needed for nausea or vomiting. ) 30 tablet 3  . Oxcarbazepine (TRILEPTAL) 300 MG tablet 1 po qhs for 4 nights, then 1 po bid. 60 tablet 1  . pantoprazole (PROTONIX) 40 MG tablet Take 1 tablet (40 mg total) by mouth 2 (two) times daily with a meal. 180 tablet 3  . polyethylene glycol (MIRALAX / GLYCOLAX) 17 g packet Take 17 g by mouth 2 (two) times daily. (Patient taking differently: Take 17 g by mouth daily as needed (constipation). ) 60 each 1  . pramipexole (MIRAPEX) 1 MG tablet Take 1 tablet (1 mg total) by mouth 2 (two) times daily. (Patient taking differently: Take 2 mg by mouth at bedtime. ) 60 tablet 5  . predniSONE (DELTASONE) 50 MG tablet Take one tablet by mouth at 9:30 pm on 12/22/19 (13 hours prior to procedure), take one tablet by mouth at 3:30 am on 12/23/19 (7 hours prior to procedure), take one tablet by mouth when 7:30 am on 12/23/19. 3 tablet 0  . Pseudoephedrine-Guaifenesin (MUCINEX D MAX STRENGTH) [phone] MG TB12 Take 1-2 tablets by mouth 2 (two) times daily as needed (cought/congestion.).     Marland Kitchen QUEtiapine (SEROQUEL XR) 400 MG 24 hr tablet Take 3 tablets (1,200 mg total) by mouth at bedtime. Take 2.5 pills qhs (Patient taking differently: Take 1,000 mg by mouth at bedtime. ) 75 tablet 5  . rosuvastatin (CRESTOR) 20 MG tablet Take 1 tablet (20 mg total) by mouth daily. 90 tablet 3  . sertraline (ZOLOFT) 100 MG tablet Take 1.5 tablets (150 mg total) by  mouth at bedtime. 45 tablet 11  . torsemide (DEMADEX) 20 MG tablet Take 20 mg by mouth daily.    Marland Kitchen trimethoprim (TRIMPEX) 100 MG tablet Take 100 mg by mouth daily.    . Vitamin D, Ergocalciferol, (DRISDOL) 1.25 MG (50000 UNIT) CAPS capsule Take 1 capsule (50,000 Units total) by mouth every 7 (seven) days. (Patient taking differently: Take 50,000 Units by mouth every Friday at 6 PM. ) 4 capsule 0  . zaleplon (SONATA) 10 MG capsule Take 1 qhs prn and may repeat 1 for midnocturnal awakening prn if she has 3 hours left to sleep (Patient taking differently: Take 10 mg by mouth at bedtime as needed and may repeat dose one time if needed for sleep. ) 60 capsule 5   No current facility-administered medications for this visit.    ALLERGIES:  Allergies  Allergen Reactions  . Iohexol Hives     Desc: IVP DYE   Desc: IVP DYE  Desc: IVP DYE  Desc: IVP DYE  Desc: IVP DYE  Desc: IVP DYE     PHYSICAL EXAM:  Performance status (ECOG): 0 - Asymptomatic  Vitals:   01/03/20 1323  BP: 118/65  Pulse: 78  Resp: 18  Temp: (!) 97 F (36.1 C)  SpO2: 98%   Wt Readings from Last 3 Encounters:  01/03/20 251 lb 8.7 oz (114.1 kg)  12/23/19 246 lb 8 oz (111.8 kg)  12/17/19 252 lb (114.3 kg)   Physical Exam Vitals reviewed.  Constitutional:      Appearance: Normal appearance. She is obese.  Cardiovascular:     Rate and Rhythm: Normal rate and regular rhythm.     Pulses: Normal pulses.     Heart sounds: Normal heart sounds.  Pulmonary:     Effort: Pulmonary effort is normal.     Breath sounds: Normal breath sounds.  Abdominal:     Comments: Scabbed blisters on lower abdomen  Neurological:     General: No focal deficit present.     Mental Status: She is alert and oriented to person, place, and time.  Psychiatric:        Mood and Affect: Mood normal.        Behavior: Behavior normal.     LABORATORY DATA:  I have reviewed the labs as listed.  CBC Latest Ref Rng & Units 12/27/2019 12/23/2019  12/23/2019  WBC 4.0 - 10.5 K/uL 14.1(H) - -  Hemoglobin 12.0 - 15.0 g/dL 12.5 12.9 12.6  Hematocrit 36 - 46 % 38.2 38.0 37.0  Platelets 150 - 400 K/uL 393 - -   CMP Latest Ref Rng & Units 12/23/2019 12/23/2019 12/17/2019  Glucose 65 - 99 mg/dL - - 130(H)  BUN 6 - 24 mg/dL - - 19  Creatinine 0.57 - 1.00 mg/dL - - 0.99  Sodium 135 - 145 mmol/L 142 142 137  Potassium 3.5 - 5.1 mmol/L 4.0 3.9 4.1  Chloride 96 - 106 mmol/L - - 98  CO2 20 - 29 mmol/L - - 21  Calcium 8.7 - 10.2 mg/dL - - 10.1  Total Protein 6.0 - 8.5 g/dL - - 6.9  Total Bilirubin 0.0 - 1.2 mg/dL - - 0.3  Alkaline Phos 44 - 121 IU/L - - 173(H)  AST 0 - 40 IU/L - - 24  ALT 0 - 32 IU/L - - 32      Component Value Date/Time   RBC 4.24 12/27/2019 1221   MCV 90.1 12/27/2019 1221   MCV 86 12/17/2019 1052   MCH 29.5 12/27/2019 1221   MCHC 32.7 12/27/2019 1221   RDW 14.5 12/27/2019 1221   RDW 13.5 12/17/2019 1052   LYMPHSABS 3.1 12/27/2019 1221   LYMPHSABS 3.0 12/17/2019 1052   MONOABS 1.0 12/27/2019 1221   EOSABS 0.5 12/27/2019 1221   EOSABS 0.8 (H) 12/17/2019 1052   BASOSABS 0.1 12/27/2019 1221   BASOSABS 0.1 12/17/2019 1052   Lab Results  Component Value Date   LDH 135 12/27/2019   LDH 153 06/10/2019    DIAGNOSTIC IMAGING:  I have independently reviewed the scans and discussed with the patient. CARDIAC CATHETERIZATION  Result Date: 12/23/2019  The left ventricular systolic function is normal.  LV end diastolic pressure is normal.  The left ventricular ejection fraction is greater than 65% by visual estimate.  There is no mitral valve regurgitation.  No angiographic evidence of CAD Hyperdynamic LV systolic function Normal right and left heart pressures Recommendations: No further ischemic workup.     ASSESSMENT:  1.  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 -She had intermittent UTIs for which she was placed on  trimethoprim since December 2020. -No  prior history of autoimmune disorders.  She quit smoking in 2003. -Ultrasound of the abdomen on 05/27/2019 showed probable fatty infiltration of the liver with minimal splenic enlargement measuring 12.1 cm in length and was calculated volume of 502 mL. -Does not have any B symptoms including fevers, night sweats or weight loss. -Blood work on 06/10/2019 was reviewed with the patient.  White count normalized at 10.2.  Normal hemoglobin and platelet count with normal differential.  Myeloproliferative disorder work-up including JAK2 V617F mutation and BCR/ABL was negative.  LDH was normal.  SPEP is negative.   2.  Family history: -Father had myeloma and paternal grandfather also had myeloma.  Mother had non-Hodgkin's lymphoma.  Maternal uncle had non-Hodgkin's lymphoma.  Paternal aunt had colon cancer.   PLAN:  1.  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  No recurrent infections.  She had one episode of UTI and one episode of cellulitis in the lower abdominal wall. -Reviewed labs from 12/27/2019.  White count is 14.1 with elevated absolute neutrophil count.  Hemoglobin and platelet count was normal.  LDH was normal. -As there is no significant change in her baseline elevated white count, I did not recommend any further work-up.  If there is significant changes, will consider bone marrow aspiration and biopsy. -RTC 6 months with follow-up and labs.   Orders placed this encounter:  Orders Placed This Encounter  Procedures  . CBC with Differential/Platelet  . Lactate dehydrogenase     Derek Jack, MD [place] [phone]   I, Milinda Antis, am acting as a scribe for Dr. Sanda Linger.  I, Derek Jack MD, have reviewed the above documentation for accuracy and completeness, and I agree with the above.

## 2020-01-07 ENCOUNTER — Other Ambulatory Visit: Payer: Self-pay | Admitting: Physician Assistant

## 2020-01-10 ENCOUNTER — Ambulatory Visit (INDEPENDENT_AMBULATORY_CARE_PROVIDER_SITE_OTHER): Payer: 59 | Admitting: Pharmacist

## 2020-01-10 ENCOUNTER — Other Ambulatory Visit: Payer: Self-pay

## 2020-01-10 ENCOUNTER — Ambulatory Visit (HOSPITAL_COMMUNITY): Payer: 59 | Attending: Cardiology

## 2020-01-10 DIAGNOSIS — R000 Tachycardia, unspecified: Secondary | ICD-10-CM | POA: Diagnosis present

## 2020-01-10 DIAGNOSIS — R0609 Other forms of dyspnea: Secondary | ICD-10-CM

## 2020-01-10 DIAGNOSIS — E781 Pure hyperglyceridemia: Secondary | ICD-10-CM

## 2020-01-10 DIAGNOSIS — E7849 Other hyperlipidemia: Secondary | ICD-10-CM | POA: Diagnosis not present

## 2020-01-10 DIAGNOSIS — E782 Mixed hyperlipidemia: Secondary | ICD-10-CM

## 2020-01-10 DIAGNOSIS — R0683 Snoring: Secondary | ICD-10-CM | POA: Diagnosis present

## 2020-01-10 DIAGNOSIS — R0600 Dyspnea, unspecified: Secondary | ICD-10-CM

## 2020-01-10 LAB — ECHOCARDIOGRAM COMPLETE
Area-P 1/2: 3.17 cm2
S' Lateral: 2.8 cm

## 2020-01-10 NOTE — Progress Notes (Signed)
Patient ID: Kristen [name]                 DOB: 28-Oct-1970                    MRN: [id number]     HPI: Kristen [name] is a 49 y.o. female patient referred to lipid clinic by Dr. Johney Frame. PMH is significant for anxiety, HLD, obesity, and HTN, and newly diagnosed T2DM  On 12/17/19, she saw Dr. Johney Frame where her lipid panel showed hypertriglyceridemia with TG 1170. She was started on Vascepa 2g BID, rosuvastatin 20 mg daily, ezetimibe 10 mg daily, and aspirin 81 mg daily.   Left heart cath on 12/23/19 showed clean coronaries with no angiographic evidence of CAD.  Patient arrives today for initial lipid clinic visit. She denies any adverse events with Vascepa, rosuvastatin, or ezetimibe and is tolerating them well. No issues with affordability. She confirms that she was fasting for most recent lipid panel. She used to take gemfibrozil in the past but this was discontinued by her provider because it was ineffective and was switched to atorvastatin which was subsequently stopped due to pill burden. Does not report intolerances to these medications.   She reports a new diagnosis of T2DM and is followed by Dr. Quintin Alto. 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 She does not currently follow a cardiac healthy diet and enjoys eating sweets, bread/carbs, and fried foods. Does not drink sodas or teas but will have a glass of wine occasionally. She states that she has seen a nutritionist/weight management clinic in the past but has not gone recently. She is motivated on making necessary lifestyle changes to control her diabetes and hypertriglyceridemia.   Current Medications: rosuvastatin 20 mg daily, ezetimibe 10 mg daily, Vascepa (icosapent ethyl) 2g BID Intolerances: Lovaza (omega-3 acid) 2g daily, gemfibrozil 600 mg daily (ineffective), atorvastatin Risk Factors: HLD, HTN, T2DM, obesity, former smoker, ASCVD 7.1% LDL goal: <100 mg/dL  Diet: Does not  follow cardiac healthy diet. Enjoys eating sweets especially with the holidays. Enjoys bread. Drinks water. No soft drinks or tea. Drinks a glass of wine occasionally.  - Ate cheese/sausage/bacon casserole for breakfast, Premier shake for lunch, and BBQ nachos for dinner.  Exercise: stays active doing housework and running errands  Family History: Mother with Afib, AVR, CHF; Father-unknown. No siblings  Social History: Former smoker  Labs: 10/25/19: TC 235, TG 403, HDL 235, LDL 117 (no lipid-lowering medication) 12/17/19: TC 358, TG 1170, HDL 41, LDL unable to be calculated (no lipid-lowering medication)  Past Medical History:  Diagnosis Date  . Allergies   . Anemia   . Anxiety   . Arthritis   . Atypical mole 11/03/2006   mid upper back (slight to moderate)  . Atypical mole 11/03/2006   lower right back (slight to moderate)  . Back pain   . Bipolar 1 disorder ([place])   . Borderline diabetic   . Chronic constipation   . Constipation   . Depression   . Family history of adverse reaction to anesthesia    mother-- ponv  . Fatty liver   . GAD (generalized anxiety disorder)   . GERD (gastroesophageal reflux disease)   . Hiatal hernia   . High triglycerides   . History of kidney stones   . History of recurrent UTIs   . History of sepsis 06/2018  . History of stomach ulcers   . History of suicidal ideation   .  Hyperlipidemia   . Hypothyroidism   . IDA (iron deficiency anemia)   . Joint pain   . Melanoma ([place]) 11/03/2006   left chest in situ (excision)  . Orthostasis   . Palpitations   . PONV (postoperative nausea and vomiting)   . Prediabetes   . Rapid heart rate   . Renal calculus, left   . Seasonal allergies   . Shortness of breath   . Sleep disorder, unspecified    per excessive sleeping during the day  . Snoring   . Swallowing difficulty   . Tachycardia   . Vertigo   . Vitamin D deficiency   . Weakness   . Wears contact lenses     Current Outpatient  Medications on File Prior to Visit  Medication Sig Dispense Refill  . acetaminophen (TYLENOL) 500 MG tablet Take 2,000 mg by mouth every 6 (six) hours as needed (pain.).    Marland Kitchen ALPRAZolam (XANAX) 1 MG tablet TAKE 1 TABLET BY MOUTH THREE TIMES DAILY AS NEEDED FOR ANXIETY (Patient taking differently: Take 1 mg by mouth 3 (three) times daily as needed for anxiety. ) 90 tablet 0  . aspirin EC 81 MG tablet Take 1 tablet (81 mg total) by mouth daily. Swallow whole. 30 tablet 11  . buPROPion (WELLBUTRIN XL) 150 MG 24 hr tablet TAKE 3 TABLETS BY MOUTH IN THE MORNING (Patient taking differently: Take 450 mg by mouth daily. ) 90 tablet 0  . cetirizine (ZYRTEC) 10 MG tablet Take 10 mg by mouth daily.     . diphenhydrAMINE (BENADRYL) 50 MG tablet Take one tablet by mouth 7:30 am on 12/23/19 (Patient taking differently: Take 25 mg by mouth every 8 (eight) hours as needed for allergies. Take one tablet by mouth 7:30 am on 12/23/19) 1 tablet 0  . ezetimibe (ZETIA) 10 MG tablet Take 1 tablet (10 mg total) by mouth daily. 90 tablet 3  . fluorometholone (FML) 0.1 % ophthalmic suspension Place 1 drop into both eyes 4 (four) times daily as needed (dry/irritated eyes.).    Marland Kitchen fluticasone (FLONASE) 50 MCG/ACT nasal spray Place 2 sprays into both nostrils daily as needed for allergies or rhinitis.     Marland Kitchen icosapent Ethyl (VASCEPA) 1 g capsule Take 2 capsules (2 g total) by mouth 2 (two) times daily. 360 capsule 3  . lamoTRIgine (LAMICTAL) 200 MG tablet Take 2.5 tablets (500 mg total) by mouth daily. (Patient taking differently: Take 500 mg by mouth at bedtime. ) 75 tablet 5  . levocetirizine (XYZAL) 5 MG tablet Take 5 mg by mouth at bedtime.     Marland Kitchen levothyroxine (SYNTHROID) 75 MCG tablet Take 75 mcg by mouth daily before breakfast.     . LINZESS 290 MCG CAPS capsule TAKE 1 CAPSULE BY MOUTH ONCE DAILY BEFORE BREAKFAST (Patient taking differently: Take 290 mcg by mouth daily before breakfast. ) 30 capsule 11  . meclizine  (ANTIVERT) 25 MG tablet Take 25 mg by mouth 3 (three) times daily as needed for dizziness.    . metFORMIN (GLUCOPHAGE) 500 MG tablet Take 1 tablet (500 mg total) by mouth 2 (two) times daily with a meal. 60 tablet 0  . metoprolol succinate (TOPROL-XL) 25 MG 24 hr tablet Take 25 mg by mouth in the morning and at bedtime.     . montelukast (SINGULAIR) 10 MG tablet Take 10 mg by mouth daily.     . ondansetron (ZOFRAN) 4 MG tablet TAKE 1 TABLET BY MOUTH EVERY 6 HOURS AS NEEDED  FOR NAUSEA OR VOMITING (Patient taking differently: Take 4 mg by mouth every 6 (six) hours as needed for nausea or vomiting. ) 30 tablet 3  . Oxcarbazepine (TRILEPTAL) 300 MG tablet 1 po qhs for 4 nights, then 1 po bid. 60 tablet 1  . pantoprazole (PROTONIX) 40 MG tablet Take 1 tablet (40 mg total) by mouth 2 (two) times daily with a meal. 180 tablet 3  . polyethylene glycol (MIRALAX / GLYCOLAX) 17 g packet Take 17 g by mouth 2 (two) times daily. (Patient taking differently: Take 17 g by mouth daily as needed (constipation). ) 60 each 1  . pramipexole (MIRAPEX) 1 MG tablet Take 1 tablet (1 mg total) by mouth 2 (two) times daily. (Patient taking differently: Take 2 mg by mouth at bedtime. ) 60 tablet 5  . predniSONE (DELTASONE) 50 MG tablet Take one tablet by mouth at 9:30 pm on 12/22/19 (13 hours prior to procedure), take one tablet by mouth at 3:30 am on 12/23/19 (7 hours prior to procedure), take one tablet by mouth when 7:30 am on 12/23/19. 3 tablet 0  . Pseudoephedrine-Guaifenesin (MUCINEX D MAX STRENGTH) [phone] MG TB12 Take 1-2 tablets by mouth 2 (two) times daily as needed (cought/congestion.).     Marland Kitchen QUEtiapine (SEROQUEL XR) 400 MG 24 hr tablet Take 3 tablets (1,200 mg total) by mouth at bedtime. Take 2.5 pills qhs (Patient taking differently: Take 1,000 mg by mouth at bedtime. ) 75 tablet 5  . rosuvastatin (CRESTOR) 20 MG tablet Take 1 tablet (20 mg total) by mouth daily. 90 tablet 3  . sertraline (ZOLOFT) 100 MG tablet Take  1.5 tablets (150 mg total) by mouth at bedtime. 45 tablet 11  . torsemide (DEMADEX) 20 MG tablet Take 20 mg by mouth daily.    Marland Kitchen trimethoprim (TRIMPEX) 100 MG tablet Take 100 mg by mouth daily.    . Vitamin D, Ergocalciferol, (DRISDOL) 1.25 MG (50000 UNIT) CAPS capsule Take 1 capsule (50,000 Units total) by mouth every 7 (seven) days. (Patient taking differently: Take 50,000 Units by mouth every Friday at 6 PM. ) 4 capsule 0  . zaleplon (SONATA) 10 MG capsule Take 1 qhs prn and may repeat 1 for midnocturnal awakening prn if she has 3 hours left to sleep (Patient taking differently: Take 10 mg by mouth at bedtime as needed and may repeat dose one time if needed for sleep. ) 60 capsule 5   No current facility-administered medications on file prior to visit.    Allergies  Allergen Reactions  . Iohexol Hives     Desc: IVP DYE   Desc: IVP DYE  Desc: IVP DYE  Desc: IVP DYE  Desc: IVP DYE  Desc: IVP DYE     Assessment/Plan:  1. Hyperlipidemia - Triglyceride levels very elevated and above goal <150 likely in the setting of uncontrolled T2DM and diet. Continue rosuvastatin 20 mg daily, ezetimibe 10 mg daily, and Vascepa 2 gm BID. Given that she started these medications recently, will hold on starting additional medications for now. If follow-up fasting labs are still elevated in 6-8 weeks, can consider adding fenofibrate. Extensively educated patient on the importance regular physical activity and adherence to cardiac healthy diet low in saturated fats, sweets, and carbohydrates. Provided educational materials on diet management and encouraged patient to continue seeing her prior nutritionist and weight management clinic. Patient verbalized understanding. Scheduled follow-up fasting labs for 02/07/20.   Richardine Service, PharmD, [place] PGY2 Cardiology Pharmacy Resident

## 2020-01-10 NOTE — Patient Instructions (Signed)
It was nice to meet you today   Your LDL is elevated and your goal is < 100   Your triglycerides are elevated at 1170 and your goal is <150   Continue rosuvastatin 20 mg daily, ezetimibe 10 mg daily, and Vascepa 2 g twice daily   We will plan to recheck your cholesterol on 02/07/20. You can come in any time after 7:30 am. Please make sure you are fasting for these labs.   Please call [phone] if you have any questions

## 2020-01-11 ENCOUNTER — Other Ambulatory Visit (INDEPENDENT_AMBULATORY_CARE_PROVIDER_SITE_OTHER): Payer: Self-pay | Admitting: Physician Assistant

## 2020-01-11 DIAGNOSIS — E559 Vitamin D deficiency, unspecified: Secondary | ICD-10-CM

## 2020-01-11 NOTE — Telephone Encounter (Signed)
Apt 12/30

## 2020-01-11 NOTE — Telephone Encounter (Signed)
Kristen [name] called to check on the status of the refills of her medications. She is out.  Appt 12/30.  Send to [facility] in [place]

## 2020-01-11 NOTE — Telephone Encounter (Signed)
This patient was last seen by Abby Potash, PA-C, and currently does not have an appointment scheduled with her.

## 2020-01-12 ENCOUNTER — Other Ambulatory Visit: Payer: Self-pay

## 2020-01-12 DIAGNOSIS — R0602 Shortness of breath: Secondary | ICD-10-CM

## 2020-01-12 NOTE — Progress Notes (Signed)
The patient has been notified of her Echocardiogram result and verbalized understanding.  All questions (if any) were answered. Patient aware that referral placed to Pulmonology for her shortness of breath.    Patient has 12/2 appointment with VB, PA tomorrow. She is requesting to cancel d/t Echo being normal.   Dr. Johney Frame advised that patient could cancel 12/2 appt until she is seen by Pulmonology first. Pt agreeable to plan and will call back to f/u or if new symptoms present.  Wilma Flavin, RN 01/12/2020 2:17 PM

## 2020-01-13 ENCOUNTER — Ambulatory Visit: Payer: Self-pay | Admitting: Physician Assistant

## 2020-01-19 ENCOUNTER — Other Ambulatory Visit (INDEPENDENT_AMBULATORY_CARE_PROVIDER_SITE_OTHER): Payer: Self-pay | Admitting: Physician Assistant

## 2020-01-19 ENCOUNTER — Telehealth: Payer: Self-pay

## 2020-01-19 DIAGNOSIS — E559 Vitamin D deficiency, unspecified: Secondary | ICD-10-CM

## 2020-01-19 NOTE — Telephone Encounter (Signed)
Last OV with Tracey 

## 2020-01-19 NOTE — Telephone Encounter (Signed)
Pt called [facility] office and left message that she had a visit on 01/10/20 to discuss Crestor and she said that she was fine, but is calling today to tell us that she is having leg pains and joint pains.    She would like a call to further discuss.

## 2020-01-19 NOTE — Telephone Encounter (Signed)
Called and spoke with patient. She states the pain started about 1.5 weeks ago. In both her legs + arms, but leg worse. Does have some swelling in her knee cap down. She restarted her torsemide yesterday and swelling improved a little. No redness or warmth. I advised for her to hold crestor for 2 weeks. She is to call us after those 2 weeks to report how she is feeling. If she is feeling better, than we can consider resuming rosuvastatin at a lower dose. If she does not, then we can try holding the zetia and vascepa (this was the most recent med started).

## 2020-01-21 ENCOUNTER — Telehealth: Payer: Self-pay | Admitting: Physician Assistant

## 2020-01-21 ENCOUNTER — Other Ambulatory Visit: Payer: Self-pay

## 2020-01-21 MED ORDER — SERTRALINE HCL 100 MG PO TABS: 200.0000 mg | ORAL_TABLET | Freq: Every day | ORAL | 11 refills | Status: DC

## 2020-01-21 NOTE — Telephone Encounter (Signed)
Pt LM on VM reporting she is having major panic/anxiety attacks.Not heart related, had total work up. She is a new diabetic. Not sure if that is causing it or the holidays. Contact # [phone]. Apt 12/30

## 2020-01-21 NOTE — Telephone Encounter (Signed)
Duplicate message. 

## 2020-01-21 NOTE — Telephone Encounter (Signed)
It is most likely due to the holidays, this is the first 1 without her mom.  Recommend increasing the Zoloft.  She is on 100 mg, 1.5 pills now also going up to 2 pills daily.  Continue all other medications. Also make sure she is eating healthy meals, small frequent meals is better than 3 huge meals a day, to keep her blood sugar more stable.

## 2020-01-21 NOTE — Telephone Encounter (Signed)
Patient called and said she is having major anxiety and panic attacks. She has had a workup on her heart and said it isn't her heart. She is also a new diabetic. Her next visit with Helene Kelp is 02/10/20. She would like a phone call back. [phone].

## 2020-01-21 NOTE — Telephone Encounter (Signed)
There are duplicate messages on her if you receive another

## 2020-01-21 NOTE — Telephone Encounter (Signed)
Patient aware of increase in Zoloft to 200 mg daily. Will send in an updated Rx for her. Advised her to keep taking all medication as prescribed along with her anxiety medication. She verbalized understanding.

## 2020-01-24 ENCOUNTER — Telehealth: Payer: Self-pay | Admitting: Cardiology

## 2020-01-24 NOTE — Telephone Encounter (Signed)
PA #[id number] is pending for split night

## 2020-01-27 NOTE — Telephone Encounter (Signed)
PA approved #[id number] for dos 02-21-20 thru 05-20-20.  Waiting on sleep lab to call back to schedule

## 2020-01-31 NOTE — Telephone Encounter (Signed)
Called and left message for patient that she is scheduled for Jan 11 at 8 pm at [facility], to expect info packet and left the sleep lab number.

## 2020-02-06 ENCOUNTER — Other Ambulatory Visit: Payer: Self-pay | Admitting: Physician Assistant

## 2020-02-07 ENCOUNTER — Other Ambulatory Visit: Payer: 59

## 2020-02-09 ENCOUNTER — Other Ambulatory Visit: Payer: Self-pay | Admitting: Physician Assistant

## 2020-02-09 ENCOUNTER — Telehealth: Payer: Self-pay | Admitting: Physician Assistant

## 2020-02-09 ENCOUNTER — Telehealth: Payer: Self-pay | Admitting: Pharmacist

## 2020-02-09 MED ORDER — BUPROPION HCL ER (XL) 150 MG PO TB24: ORAL_TABLET | ORAL | 5 refills | Status: DC

## 2020-02-09 NOTE — Telephone Encounter (Signed)
Pt called requesting refill for Wellbutrin @ [facility] [place]. APT 1/10

## 2020-02-09 NOTE — Telephone Encounter (Signed)
Patient called to reschedule labs. Labs rescheduled for 1/4. She states she is feeling a lot better off of crestor. States she doesn't think she is going to be able to take. Advised that we could try a lower dose, but we can discuss this after her lipid panel results next week.

## 2020-02-09 NOTE — Telephone Encounter (Signed)
Rx was sent  

## 2020-02-10 ENCOUNTER — Other Ambulatory Visit: Payer: 59

## 2020-02-10 ENCOUNTER — Ambulatory Visit: Payer: Medicare Other | Admitting: Physician Assistant

## 2020-02-15 ENCOUNTER — Other Ambulatory Visit: Payer: 59 | Admitting: *Deleted

## 2020-02-15 LAB — HEPATIC FUNCTION PANEL
ALT: 22 IU/L (ref 0–32)
AST: 19 IU/L (ref 0–40)
Albumin: 4.8 g/dL (ref 3.8–4.8)
Alkaline Phosphatase: 132 IU/L — ABNORMAL HIGH (ref 44–121)
Bilirubin Total: 0.3 mg/dL (ref 0.0–1.2)
Bilirubin, Direct: 0.12 mg/dL (ref 0.00–0.40)
Total Protein: 6.9 g/dL (ref 6.0–8.5)

## 2020-02-15 LAB — LIPID PANEL
Chol/HDL Ratio: 6.1 ratio — ABNORMAL HIGH (ref 0.0–4.4)
Cholesterol, Total: 245 mg/dL — ABNORMAL HIGH (ref 100–199)
HDL: 40 mg/dL (ref >39)
LDL Chol Calc (NIH): 126 mg/dL — ABNORMAL HIGH (ref 0–99)
Triglycerides: 440 mg/dL — ABNORMAL HIGH (ref 0–149)
VLDL Cholesterol Cal: 79 mg/dL — ABNORMAL HIGH (ref 5–40)

## 2020-02-17 ENCOUNTER — Telehealth: Payer: Self-pay | Admitting: Pharmacist

## 2020-02-17 ENCOUNTER — Telehealth: Payer: Self-pay | Admitting: Internal Medicine

## 2020-02-17 DIAGNOSIS — E781 Pure hyperglyceridemia: Secondary | ICD-10-CM

## 2020-02-17 MED ORDER — FENOFIBRATE 145 MG PO TABS: 145.0000 mg | ORAL_TABLET | Freq: Every day | ORAL | 11 refills | Status: DC

## 2020-02-17 MED ORDER — ROSUVASTATIN CALCIUM 5 MG PO TABS: 5.0000 mg | ORAL_TABLET | Freq: Every day | ORAL | 11 refills | Status: DC

## 2020-02-17 NOTE — Telephone Encounter (Signed)
Called pt to discuss lipid panel results. Pt is willing to try lower dose of rosuvastatin 5mg  daily (previously intolerant to 20mg  daily). She is also agreeable to starting fenofibrate 145mg  daily for her elevated TG. She will continue on Vascepa and ezetimibe. Recheck fasting lipids and LFTs in 3 months. Pt aware to call with any issues before then.

## 2020-02-17 NOTE — Telephone Encounter (Signed)
Spoke with pt. Mailed lab order to pt per pts request.

## 2020-02-17 NOTE — Telephone Encounter (Signed)
Pt is aware of her OV with Tana Coast, PA on 03/21/2020 and wanted to know if she needed to do labs prior to appointment because she lost her lab orders from previously.

## 2020-02-21 ENCOUNTER — Telehealth (INDEPENDENT_AMBULATORY_CARE_PROVIDER_SITE_OTHER): Payer: 59 | Admitting: Physician Assistant

## 2020-02-21 ENCOUNTER — Encounter: Payer: Self-pay | Admitting: Physician Assistant

## 2020-02-21 DIAGNOSIS — F319 Bipolar disorder, unspecified: Secondary | ICD-10-CM | POA: Diagnosis not present

## 2020-02-21 DIAGNOSIS — F99 Mental disorder, not otherwise specified: Secondary | ICD-10-CM

## 2020-02-21 DIAGNOSIS — F5105 Insomnia due to other mental disorder: Secondary | ICD-10-CM | POA: Diagnosis not present

## 2020-02-21 DIAGNOSIS — E782 Mixed hyperlipidemia: Secondary | ICD-10-CM

## 2020-02-21 DIAGNOSIS — Z634 Disappearance and death of family member: Secondary | ICD-10-CM | POA: Diagnosis not present

## 2020-02-21 DIAGNOSIS — G2581 Restless legs syndrome: Secondary | ICD-10-CM

## 2020-02-21 DIAGNOSIS — F411 Generalized anxiety disorder: Secondary | ICD-10-CM

## 2020-02-21 MED ORDER — LAMOTRIGINE 200 MG PO TABS: ORAL_TABLET | ORAL | 5 refills | Status: DC

## 2020-02-21 MED ORDER — ZALEPLON 10 MG PO CAPS: ORAL_CAPSULE | ORAL | 5 refills | Status: DC

## 2020-02-21 MED ORDER — OXCARBAZEPINE 300 MG PO TABS: 300.0000 mg | ORAL_TABLET | Freq: Two times a day (BID) | ORAL | 5 refills | Status: DC

## 2020-02-21 NOTE — Progress Notes (Signed)
[facility]  Patient ID: Kristen [name],  MRN: [id number]  PCP: Manon Hilding, MD  Date of Evaluation: 02/21/2020 Time spent:40 minutes  Chief Complaint:  Chief Complaint    Anxiety; Depression; Insomnia     Virtual Visit via Telehealth  I connected with patient by telephone, with their informed consent, and verified patient privacy and that I am speaking with the correct person using two identifiers.  I am private, in my office and the patient is at home.  I discussed the limitations, risks, security and privacy concerns of performing an evaluation and management service by telephone and the availability of in person appointments. I also discussed with the patient that there may be a patient responsible charge related to this service. The patient expressed understanding and agreed to proceed.  Caregility isn't accessible, therefore our visit had to be by phone, or her visit would have to be rescheduled. That is not an option.   I discussed the assessment and treatment plan with the patient. The patient was provided an opportunity to ask questions and all were answered. The patient agreed with the plan and demonstrated an understanding of the instructions.   The patient was advised to call back or seek an in-person evaluation if the symptoms worsen or if the condition fails to improve as anticipated.  I provided 40 minutes of non-face-to-face time during this encounter.  HISTORY/CURRENT STATUS: HPI For routine med check.  Had a pretty good Christmas. Her she and her step-mom have talked, and she's forgiven her and her daddy (who is deceased). Overall she's doing really well, since she's off the Seroquel and now on the Trileptal.   At the last visit we weaned off Seroquel and pramipexole.  She has had no problems with restlessness in her legs or tremor of any type since going off the pramipexole.  No trouble with mood or sleep after stopping the Seroquel.  Since our  last visit, she has continued to follow up with other specialists for continued treatment of elevated lipids, shortness of breath, vitamin D deficiency, and type 2 diabetes.  She has an upcoming appointment with pulmonology.  Despite these physical health problems, states she feels better mentally than she has in a long time.  Patient denies loss of interest in usual activities and is able to enjoy things.  Denies decreased energy or motivation.  Appetite has not changed.  No extreme sadness, tearfulness, or feelings of hopelessness.  Denies any changes in concentration, making decisions or remembering things.  Is sleeping better.  Anxiety is still present, with use of Xanax at least once a day and occasionally 3 a day.  1 to 2/day is average and it is effective.  Sleeps well with Sonata. Denies suicidal or homicidal thoughts.  Patient denies increased energy with decreased need for sleep, no increased talkativeness, no racing thoughts, no impulsivity or risky behaviors, no increased spending, no increased libido, no grandiosity, no paranoia, and no hallucinations.   Denies dizziness, syncope, seizures, numbness, tingling, tremor, tics, unsteady gait, slurred speech, confusion. Denies muscle or joint pain, stiffness, or dystonia.  Individual Medical History/ Review of Systems: Changes? :Yes  see HPI  Past medications for mental health diagnoses include: Prozac, Paxil, Lexapro, Celexa, Zoloft, Viibryd, Effexor, Cymbalta, Risperdal, Latuda, Lamictal, VPA, Lithium-became toxic,Xanax, Wellbutrin, Abilify, Willette Alma, Strattera, Kapvay, Saphris, Adderall, Vyvanse, Lunesta, Sonata,  Vraylar, Seroquel, Geodon, Tegretal  Allergies: Iohexol and Rosuvastatin  Current Medications:  Current Outpatient Medications:  .  acetaminophen (TYLENOL) 500 MG  tablet, Take 2,000 mg by mouth every 6 (six) hours as needed (pain.)., Disp: , Rfl:  .  ALPRAZolam (XANAX) 1 MG tablet, Take 1 tablet (1 mg total) by mouth 3 (three)  times daily as needed for anxiety., Disp: 90 tablet, Rfl: 5 .  aspirin EC 81 MG tablet, Take 1 tablet (81 mg total) by mouth daily. Swallow whole., Disp: 30 tablet, Rfl: 11 .  buPROPion (WELLBUTRIN XL) 150 MG 24 hr tablet, TAKE 3 TABLETS BY MOUTH ONCE DAILY IN THE MORNING, Disp: 90 tablet, Rfl: 5 .  cetirizine (ZYRTEC) 10 MG tablet, Take 10 mg by mouth daily., Disp: , Rfl:  .  ezetimibe (ZETIA) 10 MG tablet, Take 1 tablet (10 mg total) by mouth daily., Disp: 90 tablet, Rfl: 3 .  fenofibrate (TRICOR) 145 MG tablet, Take 1 tablet (145 mg total) by mouth daily., Disp: 30 tablet, Rfl: 11 .  levocetirizine (XYZAL) 5 MG tablet, Take 5 mg by mouth at bedtime. , Disp: , Rfl:  .  levothyroxine (SYNTHROID) 75 MCG tablet, Take 75 mcg by mouth daily before breakfast. , Disp: , Rfl:  .  LINZESS 290 MCG CAPS capsule, TAKE 1 CAPSULE BY MOUTH ONCE DAILY BEFORE BREAKFAST (Patient taking differently: Take 290 mcg by mouth daily before breakfast.), Disp: 30 capsule, Rfl: 11 .  meclizine (ANTIVERT) 25 MG tablet, Take 25 mg by mouth 3 (three) times daily as needed for dizziness., Disp: , Rfl:  .  metFORMIN (GLUCOPHAGE) 500 MG tablet, Take 1 tablet (500 mg total) by mouth 2 (two) times daily with a meal., Disp: 60 tablet, Rfl: 0 .  metoprolol succinate (TOPROL-XL) 25 MG 24 hr tablet, Take 25 mg by mouth in the morning and at bedtime. , Disp: , Rfl:  .  montelukast (SINGULAIR) 10 MG tablet, Take 10 mg by mouth daily. , Disp: , Rfl:  .  ondansetron (ZOFRAN) 4 MG tablet, TAKE 1 TABLET BY MOUTH EVERY 6 HOURS AS NEEDED FOR NAUSEA OR VOMITING (Patient taking differently: Take 4 mg by mouth every 6 (six) hours as needed for nausea or vomiting.), Disp: 30 tablet, Rfl: 3 .  pantoprazole (PROTONIX) 40 MG tablet, Take 1 tablet (40 mg total) by mouth 2 (two) times daily with a meal., Disp: 180 tablet, Rfl: 3 .  pramipexole (MIRAPEX) 1 MG tablet, Take 1 tablet (1 mg total) by mouth 2 (two) times daily. (Patient taking differently: Take  2 mg by mouth at bedtime.), Disp: 60 tablet, Rfl: 5 .  rosuvastatin (CRESTOR) 5 MG tablet, Take 1 tablet (5 mg total) by mouth daily., Disp: 30 tablet, Rfl: 11 .  sertraline (ZOLOFT) 100 MG tablet, Take 2 tablets (200 mg total) by mouth at bedtime., Disp: 60 tablet, Rfl: 11 .  torsemide (DEMADEX) 20 MG tablet, Take 20 mg by mouth daily., Disp: , Rfl:  .  fluorometholone (FML) 0.1 % ophthalmic suspension, Place 1 drop into both eyes 4 (four) times daily as needed (dry/irritated eyes.)., Disp: , Rfl:  .  icosapent Ethyl (VASCEPA) 1 g capsule, Take 2 capsules (2 g total) by mouth 2 (two) times daily., Disp: 360 capsule, Rfl: 3 .  lamoTRIgine (LAMICTAL) 200 MG tablet, TAKE 2 & 1/2 (TWO & ONE-HALF) TABLETS BY MOUTH ONCE DAILY, Disp: 75 tablet, Rfl: 5 .  Oxcarbazepine (TRILEPTAL) 300 MG tablet, Take 1 tablet (300 mg total) by mouth 2 (two) times daily., Disp: 60 tablet, Rfl: 5 .  zaleplon (SONATA) 10 MG capsule, TAKE 1 CAPSULE BY MOUTH AT BEDTIME AS NEEDED,  MAY REPEAT 1 FOR MIDNOCTURNAL AWAKENING AS NEEDED IF THREE HOURS LEFT TO SLEEP, Disp: 60 capsule, Rfl: 5 Medication Side Effects: none  Family Medical/ Social History: Changes?  See above  MENTAL HEALTH EXAM:  There were no vitals taken for this visit.There is no height or weight on file to calculate BMI.  General Appearance: Unable to assess  Eye Contact:  Unable to assess  Speech:  Clear and Coherent and Normal Rate  Volume:  Normal  Mood:  Euthymic  Affect:  Appropriate  Thought Process:  Goal Directed and Descriptions of Associations: Intact  Orientation:  Full (Time, Place, and Person)  Thought Content: Logical   Suicidal Thoughts:  No  Homicidal Thoughts:  No  Memory:  WNL  Judgement:  Good  Insight:  Good  Psychomotor Activity:  Unable to assess  Concentration:  Concentration: Good and Attention Span: Good  Recall:  Good  Fund of Knowledge: Good  Language: Good  Assets:  Desire for Improvement  ADL's:  Intact  Cognition: WNL   Prognosis:  Good   Most recent pertinent labs: 02/15/2020 Lipid panel total cholesterol is 245 (down from 358 in November), triglycerides 440 (down from 1170 in November) HDL was 40 (was 41) and LDL 126 (unable to be tested in November.) LFTs alkaline phosphatase was 132 other values were completely normal   DIAGNOSES:    ICD-10-CM   1. Bipolar I disorder ([place])  F31.9   2. Generalized anxiety disorder  F41.1   3. Insomnia due to other mental disorder  F51.05    F99   4. Bereavement  Z63.4   5. Restless legs  G25.81   6. Mixed hyperlipidemia  E78.2     Receiving Psychotherapy: Yes  Felix Ahmadi   RECOMMENDATIONS:  PDMP was reviewed. I provided 40 minutes of non-ace to face time during this encounter, including time spent reviewing her recent labs, showing improved lipid panel which hopefully will continue to normalize after stopping the Seroquel.  Since she is doing well off that we will stay off of it.  She may need to return to an out of some point in the future but until then we will treat the bipolar disorder with mood stabilizers.  She is also having no tremor so stay off of the pramipexole. I am glad she is doing well. Continue Xanax 1 mg, 1 p.o. 3 times daily as needed anxiety.   Continue Zoloft 100 mg, 2 p.o. daily. Continue Wellbutrin XL 150 mg, 3 p.o. every morning. Continue Trileptal 300 mg, 1 p.o. twice daily. Continue Lamictal 200 mg, 2.5 pills daily. Continue Sonata 10 mg, 1 p.o. nightly as needed and may repeat 1 as needed for mid nocturnal awakening as long as she has 3 hours left to sleep. Continue counseling. Return in 2 months.  [name], PA-C

## 2020-02-22 ENCOUNTER — Ambulatory Visit: Payer: 59 | Attending: Cardiology | Admitting: Cardiology

## 2020-02-22 ENCOUNTER — Other Ambulatory Visit: Payer: Self-pay

## 2020-02-22 DIAGNOSIS — G4733 Obstructive sleep apnea (adult) (pediatric): Secondary | ICD-10-CM | POA: Diagnosis not present

## 2020-02-22 DIAGNOSIS — R0600 Dyspnea, unspecified: Secondary | ICD-10-CM | POA: Diagnosis present

## 2020-02-22 DIAGNOSIS — R000 Tachycardia, unspecified: Secondary | ICD-10-CM

## 2020-02-22 DIAGNOSIS — E782 Mixed hyperlipidemia: Secondary | ICD-10-CM

## 2020-02-22 DIAGNOSIS — E781 Pure hyperglyceridemia: Secondary | ICD-10-CM

## 2020-02-22 DIAGNOSIS — R0609 Other forms of dyspnea: Secondary | ICD-10-CM

## 2020-02-22 DIAGNOSIS — R0683 Snoring: Secondary | ICD-10-CM

## 2020-02-23 ENCOUNTER — Telehealth: Payer: Self-pay | Admitting: *Deleted

## 2020-02-23 ENCOUNTER — Encounter: Payer: Self-pay | Admitting: Internal Medicine

## 2020-02-23 ENCOUNTER — Ambulatory Visit (INDEPENDENT_AMBULATORY_CARE_PROVIDER_SITE_OTHER): Payer: 59 | Admitting: Internal Medicine

## 2020-02-23 VITALS — BP 136/80 | HR 84 | Temp 97.9°F | Ht 66.0 in | Wt 242.0 lb

## 2020-02-23 DIAGNOSIS — R0602 Shortness of breath: Secondary | ICD-10-CM

## 2020-02-23 DIAGNOSIS — R053 Chronic cough: Secondary | ICD-10-CM | POA: Diagnosis not present

## 2020-02-23 DIAGNOSIS — G479 Sleep disorder, unspecified: Secondary | ICD-10-CM

## 2020-02-23 DIAGNOSIS — K219 Gastro-esophageal reflux disease without esophagitis: Secondary | ICD-10-CM | POA: Diagnosis not present

## 2020-02-23 NOTE — Telephone Encounter (Signed)
-----   Message from Sueanne Margarita, MD sent at 02/23/2020 11:02 AM EST ----- Please let patient know that they have sleep apnea and recommend CPAP titration. Please set up titration in the sleep lab.

## 2020-02-23 NOTE — Progress Notes (Signed)
Kristen [name]    [id number]    1970/02/12  Primary Care Physician:Sasser, Silvestre Moment, MD  Referring Physician: Freada Bergeron, MD [id number] [place],  [place] [address] Reason for Consultation: chronic cough and shortness of breath Date of Consultation: 02/23/2020  Chief complaint:   Chief Complaint  Patient presents with  . Shortness of Breath    Reports chronic cough/hoarseness with shortness of breath over 1 year. Reports feeling at baseline.     HPI: Kristen [name] is a 50 y.o. woman with history of chronic cough and shortness of breath for the past one year.  Her husband is a patient of mine - Legrand Como - and I see him for sarcoidosis.   She has gained weight over this time frame and has also been treated for lower extremity edema and is on diuretics right now.   She follows with allergy due to seasonal allergies - has been on allergy shots previously but never made it to maintenance therapy.   She has used her husband's albuterol and it seems to have helped her breathing. She uses it 2-3 times/day and it gives her relief within 5 minutes and gives her relief for 1-2 hours. She takes mucinex DM and it does not help.  She wakes up in the morning with a lump in her throat. Whitish mucus comes up with this. Her cough does wake her up at night and she takes cough drops at night. She does have reflux and is on protonix. She sleeps with a wedge pillow.  She has frequent throat clearing. She does have itchy and dry eyes but no rhinorrhea. She doesn't drink alcohol, she has one cup of coffee in the morning, she has stopped drinking sodas recently.   She does a neti pot nasal lavage daily and has a history of sinus surgery.   She does have a psychiatrist and takes xanax three times a day. Follows at [facility] facility.   Exertion, walking, going up stairs, cleaning the house makes symptoms worse.   She has seen ENT and has been told she  has reflux related hoarse voice.    Social history:  Occupation: on disability for mental illness.  Exposures:no pets at home.  Smoking history: former smoker, quit in 2010, nothing since then. About 20 years x 1/2 ppd  Social History   Occupational History  . Occupation: disability  Tobacco Use  . Smoking status: Former Smoker    Packs/day: 1.00    Years: 5.00    Pack years: 5.00    Types: Cigarettes    Quit date: 02/09/2002    Years since quitting: 18.0  . Smokeless tobacco: Never Used  Vaping Use  . Vaping Use: Never used  Substance and Sexual Activity  . Alcohol use: No  . Drug use: No  . Sexual activity: Yes    Birth control/protection: Surgical    Relevant family history: Family History  Problem Relation Age of Onset  . Asthma Mother   . Rheum arthritis Mother   . Non-Hodgkin's lymphoma Mother   . Congestive Heart Failure Mother   . Atrial fibrillation Mother   . Diabetes Mother   . Hyperlipidemia Mother   . Cancer Mother   . Colon cancer Father   . Multiple myeloma Father   . Hypertension Father   . Cancer Father   . Depression Father   . Anxiety disorder Father   . Alcoholism Father   .  Obesity Father   . Allergies Other        entire family(mom and dad)  . Diabetes Maternal Grandmother   . Congestive Heart Failure Maternal Grandmother   . Mental illness Paternal Grandmother   . Colon cancer Paternal Grandfather   . Bone cancer Paternal Grandfather     Past Medical History:  Diagnosis Date  . Allergies   . Anemia   . Anxiety   . Arthritis   . Atypical mole 11/03/2006   mid upper back (slight to moderate)  . Atypical mole 11/03/2006   lower right back (slight to moderate)  . Back pain   . Bipolar 1 disorder ([place])   . Borderline diabetic   . Chronic constipation   . Constipation   . Depression   . Family history of adverse reaction to anesthesia    mother-- ponv  . Fatty liver   . GAD (generalized anxiety disorder)   . GERD  (gastroesophageal reflux disease)   . Hiatal hernia   . High triglycerides   . History of kidney stones   . History of recurrent UTIs   . History of sepsis 06/2018  . History of stomach ulcers   . History of suicidal ideation   . Hyperlipidemia   . Hypothyroidism   . IDA (iron deficiency anemia)   . Joint pain   . Melanoma ([place]) 11/03/2006   left chest in situ (excision)  . Orthostasis   . Palpitations   . PONV (postoperative nausea and vomiting)   . Prediabetes   . Rapid heart rate   . Renal calculus, left   . Seasonal allergies   . Shortness of breath   . Sleep disorder, unspecified    per excessive sleeping during the day  . Snoring   . Swallowing difficulty   . Tachycardia   . Vertigo   . Vitamin D deficiency   . Weakness   . Wears contact lenses     Past Surgical History:  Procedure Laterality Date  . ANTERIOR CERVICAL DECOMP/DISCECTOMY FUSION  02/17/2012   Procedure: ANTERIOR CERVICAL DECOMPRESSION/DISCECTOMY FUSION 2 LEVELS;  Surgeon: Hosie Spangle, MD;  Location: [facility];  Service: Neurosurgery;  Laterality: N/A;  Cervical four-five and Cervical six-seven anterior cervial decompression with fusion plating and bonegraft  . ANTERIOR CERVICAL DECOMP/DISCECTOMY FUSION  04-01-2001   '@[facility]'    C5---6  . BIOPSY  07/20/2018   Procedure: BIOPSY;  Surgeon: Daneil Dolin, MD;  [facility];  Service: Endoscopy;;  gastric   . BREAST REDUCTION SURGERY Bilateral 1995  . CESAREAN SECTION  x2   last one 1998   with BILATERAL TUBAL LIGATION  . CESAREAN SECTION WITH BILATERAL TUBAL LIGATION    . COLONOSCOPY WITH PROPOFOL N/A 07/20/2018   Dr. Gala Romney: Diverticulosis, internal hemorrhoids, 5 mm splenic flexure tubular adenoma removed.  . CYSTOSCOPY/URETEROSCOPY/HOLMIUM LASER/STENT PLACEMENT Left 08/21/2018   Procedure: CYSTOSCOPY LEFT RETROGRADE PYELOGRAM /URETEROSCOPY/HOLMIUM LASER/STENT PLACEMENT;  Surgeon: Ceasar Mons, MD;  [facility];  Service: Urology;  Laterality: Left;  . DILATION AND CURETTAGE OF UTERUS  1992   for miscarriage  . ENDOMETRIAL ABLATION  2014  . ESOPHAGOGASTRODUODENOSCOPY     20 years ago.   Marland Kitchen ESOPHAGOGASTRODUODENOSCOPY (EGD) WITH PROPOFOL N/A 07/20/2018   Dr. Gala Romney: Small hiatal hernia, erythematous mucosa in the stomach with a healing gastric ulcer measuring 1 cm, biopsies negative.  Marland Kitchen [place]   unilateral for infertility  . NASAL SINUS SURGERY  2010  approx.  Marland Kitchen POLYPECTOMY  07/20/2018   Procedure: POLYPECTOMY;  Surgeon: Daneil Dolin, MD;  [facility];  Service: Endoscopy;;  . POSTERIOR CERVICAL FUSION/FORAMINOTOMY N/A 08/18/2013   Procedure: CERVICAL FOUR TO CERVICAL SEVEN POSTERIOR CERVICAL FUSION/FORAMINOTOMY LEVEL 3;  Surgeon: Hosie Spangle, MD;  Location: [facility];  Service: Neurosurgery;  Laterality: N/A;  C4-7 posterior cervical fusion with lateral mass fixation  . RIGHT/LEFT HEART CATH AND CORONARY ANGIOGRAPHY N/A 12/23/2019   Procedure: RIGHT/LEFT HEART CATH AND CORONARY ANGIOGRAPHY;  Surgeon: Burnell Blanks, MD;  Location: [facility];  Service: Cardiovascular;  Laterality: N/A;     Physical Exam: Blood pressure 136/80, pulse 84, temperature 97.9 F (36.6 C), height '5\' 6"'  (1.676 m), weight 242 lb (109.8 kg), SpO2 96 %. Gen:      No acute distress ENT:  no nasal polyps, mucus membranes moist +cobblestoning Lungs:    No increased respiratory effort, symmetric chest wall excursion, clear to auscultation bilaterally, no wheezes or crackles CV:         Regular rate and rhythm; no murmurs, rubs, or gallops.  No pedal edema Abd:      + bowel sounds; soft, non-tender; obese MSK: no acute synovitis of DIP or PIP joints, no mechanics hands.  Skin:      Warm and dry; no rashes Neuro: normal speech, no focal facial asymmetry Psych: alert and oriented x3, normal mood and affect   Data Reviewed/Medical Decision Making:  Independent  interpretation of tests: Imaging: . Review of patient's chest xray July 2021 images revealed no acute process. The patient's images have been independently reviewed by me.    PFTs: None on file  Sleep study performed 02/22/2020 negative for OSA.  Labs:  Lab Results  Component Value Date   WBC 14.1 (H) 12/27/2019   HGB 12.5 12/27/2019   HCT 38.2 12/27/2019   MCV 90.1 12/27/2019   PLT 393 12/27/2019   Lab Results  Component Value Date   NA 142 12/23/2019   K 4.0 12/23/2019   CL 98 12/17/2019   CO2 21 12/17/2019     Immunization status:  Immunization History  Administered Date(s) Administered  . Influenza,inj,Quad PF,6+ Mos 11/11/2015  . Influenza-Unspecified 12/01/2019    . I reviewed prior external note(s) from GI, hospital stay, cardiology . I reviewed the result(s) of the labs and imaging as noted above.  . I have ordered PFTs  Assessment:  Chronic Cough Shortness of breath GERD Chronic Rhinitis Snoring - PSG Jan 2022 negative for OSA  Plan/Recommendations: Mrs. Meinders has symptoms of cough, voice hoarseness and shortness of breath which seems to be  I think in large part her cough and voice hoarseness are related to GERD/LPR.  She notes that two previous physicians have told her this as well.  She could be having some dyspnea relieved with albuterol - although there is a sizeable placebo effect with albuterol as well.  Her symptoms could also be related to anxiety which she mentions.  Wonder if she could come off the xanax at some point.   Full set of PFTs to evaluate for smoking related lung disease and dyspnea. Differential also includes obesity and deconditioning.   Continue nasal saline rinses.   Continue albuterol prn for now.   We discussed disease management and progression at length today.   I spent 48 minutes in the care of this patient today including pre-charting, chart review, review of results, face-to-face care, coordination of care and  communication with consultants etc.).  Return to Care: Return in about 4 weeks (around 03/22/2020).  Lenice Llamas, MD Pulmonary and [place]  CC: Freada Bergeron, MD

## 2020-02-23 NOTE — Patient Instructions (Addendum)
The patient should have follow up scheduled with myself in 1 months after PFT   Prior to next visit patient should have: Full set of PFTs  Take the albuterol rescue inhaler every 4 to 6 hours as needed for wheezing or shortness of breath. You can also take it 15 minutes before exercise or exertional activity. Side effects include heart racing or pounding, jitters or anxiety. If you have a history of an irregular heart rhythm, it can make this worse. Can also give some patients a hard time sleeping.   What is GERD? Gastroesophageal reflux disease (GERD) is gastroesophageal reflux diseasewhich occurs when the lower esophageal sphincter (LES) opens spontaneously, for varying periods of time, or does not close properly and stomach contents rise up into the esophagus. GER is also called acid reflux or acid regurgitation, because digestive juices-called acids-rise up with the food. The esophagus is the tube that carries food from the mouth to the stomach. The LES is a ring of muscle at the bottom of the esophagus that acts like a valve between the esophagus and stomach.  When acid reflux occurs, food or fluid can be tasted in the back of the mouth. When refluxed stomach acid touches the lining of the esophagus it may cause a burning sensation in the chest or throat called heartburn or acid indigestion. Occasional reflux is common. Persistent reflux that occurs more than twice a week is considered GERD, and it can eventually lead to more serious health problems. People of all ages can have GERD. Studies have shown that GERD may worsen or contribute to asthma, chronic cough, and pulmonary fibrosis.   What are the symptoms of GERD? The main symptom of GERD in adults is frequent heartburn, also called acid indigestion-burning-type pain in the lower part of the mid-chest, behind the breast bone, and in the mid-abdomen.  Not all reflux is acidic in nature, and many patients don't have heart burn at all.  Sometimes it feels like a cough (either dry or with mucus), choking sensation, asthma, shortness of breath, waking up at night, frequent throat clearing, or trouble swallowing.    What causes GERD? The reason some people develop GERD is still unclear. However, research shows that in people with GERD, the LES relaxes while the rest of the esophagus is working. Anatomical abnormalities such as a hiatal hernia may also contribute to GERD. A hiatal hernia occurs when the upper part of the stomach and the LES move above the diaphragm, the muscle wall that separates the stomach from the chest. Normally, the diaphragm helps the LES keep acid from rising up into the esophagus. When a hiatal hernia is present, acid reflux can occur more easily. A hiatal hernia can occur in people of any age and is most often a normal finding in otherwise healthy people over age 26. Most of the time, a hiatal hernia produces no symptoms.   Other factors that may contribute to GERD include - Obesity or recent weight gain - Pregnancy  - Smoking  - Diet - Certain medications  Common foods that can worsen reflux symptoms include: - carbonated beverages - artificial sweeteners - citrus fruits  - chocolate  - drinks with caffeine or alcohol  - fatty and fried foods  - garlic and onions  - mint flavorings  - spicy foods  - tomato-based foods, like spaghetti sauce, salsa, chili, and pizza   Lifestyle Changes If you smoke, stop.  Avoid foods and beverages that worsen symptoms (see above.) Lose weight  if needed.  Eat small, frequent meals.  Wear loose-fitting clothes.  Avoid lying down for 3 hours after a meal.  Raise the head of your bed 6 to 8 inches by securing wood blocks under the bedposts. Just using extra pillows will not help, but using a wedge-shaped pillow may be helpful.  Medications  H2 blockers, such as cimetidine (Tagamet HB), famotidine (Pepcid AC), nizatidine (Axid AR), and ranitidine (Zantac 75),  decrease acid production. They are available in prescription strength and over-the-counter strength. These drugs provide short-term relief and are effective for about half of those who have GERD symptoms.  Proton pump inhibitors include omeprazole (Prilosec, Zegerid), lansoprazole (Prevacid), pantoprazole (Protonix), rabeprazole (Aciphex), and esomeprazole (Nexium), which are available by prescription. Prilosec is also available in over-the-counter strength. Proton pump inhibitors are more effective than H2 blockers and can relieve symptoms and heal the esophageal lining in almost everyone who has GERD.  Because drugs work in different ways, combinations of medications may help control symptoms. People who get heartburn after eating may take both antacids and H2 blockers. The antacids work first to neutralize the acid in the stomach, and then the H2 blockers act on acid production. By the time the antacid stops working, the H2 blocker will have stopped acid production. Your health care provider is the best source of information about how to use medications for GERD.   Points to Remember 1. You can have GERD without having heartburn. Your symptoms could include a dry cough, asthma symptoms, or trouble swallowing.  2. Taking medications daily as prescribed is important in controlling you symptoms.  Sometimes it can take up to 8 weeks to fully achieve the effects of the medications prescribed.  3. Coughing related to GERD can be difficult to treat and is very frustrating!  However, it is important to stick with these medications and lifestyle modifications before pursuing more aggressive or invasive test and treatments.

## 2020-02-23 NOTE — Telephone Encounter (Signed)
Informed patient of sleep study results and patient understanding was verbalized. Patient understands her sleep study showed they have sleep apnea and recommend CPAP titration. Please set up titration in the sleep lab.   Titration sent to sleep pool.  PER DPR Left detailed message on voicemail and informed patient to call back with questions  

## 2020-02-23 NOTE — Procedures (Signed)
   Patient Name: Kristen [name], Kristen [name] Date:02/22/2020 Gender: Female D.O.B: [birth year] Age (years): 4 Referring Provider: Gwyndolyn Kaufman MD Height (inches): 66 Interpreting Physician: Fransico Him MD, ABSM Weight (lbs): 243 RPSGT: Rosebud Poles BMI: 39 MRN: [id number] Neck Size: 17.00  CLINICAL INFORMATION Sleep Study Type: NPSG  Indication for sleep study: N/A  Epworth Sleepiness Score: 13  SLEEP STUDY TECHNIQUE As per the AASM Manual for the Scoring of Sleep and Associated Events v2.3 (April 2016) with a hypopnea requiring 4% desaturations.  The channels recorded and monitored were frontal, central and occipital EEG, electrooculogram (EOG), submentalis EMG (chin), nasal and oral airflow, thoracic and abdominal wall motion, anterior tibialis EMG, snore microphone, electrocardiogram, and pulse oximetry.  MEDICATIONS Medications self-administered by patient taken the night of the study : N/A  SLEEP ARCHITECTURE The study was initiated at 10:30:00 PM and ended at 5:18:26 AM.  Sleep onset time was 0.7 minutes and the sleep efficiency was 89.6%. The total sleep time was 366 minutes.  Stage REM latency was 226.5 minutes.  The patient spent 2.0% of the night in stage N1 sleep, 62.8% in stage N2 sleep, 23.6% in stage N3 and 11.5% in REM.  Alpha intrusion was absent.  Supine sleep was 76.57%.  RESPIRATORY PARAMETERS The overall apnea/hypopnea index (AHI) was 8.5 per hour. There were 3 total apneas, including 3 obstructive, 0 central and 0 mixed apneas. There were 49 hypopneas and 0 RERAs.  The AHI during Stage REM sleep was 1.4 per hour.  AHI while supine was 10.7 per hour.  The mean oxygen saturation was 92.3%. The minimum SpO2 during sleep was 84.0%.  loud snoring was noted during this study.  CARDIAC DATA The 2 lead EKG demonstrated sinus rhythm. The mean heart rate was 65.4 beats per minute. Other EKG findings include: None.  LEG MOVEMENT DATA The total PLMS  were 0 with a resulting PLMS index of 0.0. Associated arousal with leg movement index was 0.0 .  IMPRESSIONS - Mild obstructive sleep apnea occurred during this study (AHI = 8.5/h). - No significant central sleep apnea occurred during this study (CAI = 0.0/h). - Mild oxygen desaturation was noted during this study (Min O2 = 84.0%). - The patient snored with loud snoring volume. - No cardiac abnormalities were noted during this study. - Clinically significant periodic limb movements did not occur during sleep. No significant associated arousals.  DIAGNOSIS - Obstructive Sleep Apnea (G47.33)  RECOMMENDATIONS - Therapeutic CPAP titration to determine optimal pressure required to alleviate sleep disordered breathing. - Positional therapy avoiding supine position during sleep. - Avoid alcohol, sedatives and other CNS depressants that may worsen sleep apnea and disrupt normal sleep architecture. - Sleep hygiene should be reviewed to assess factors that may improve sleep quality. - Weight management and regular exercise should be initiated or continued if appropriate.  [Electronically signed] 02/23/2020 10:59 AM  Fransico Him MD, ABSM Diplomate, American Board of Sleep Medicine

## 2020-02-24 ENCOUNTER — Telehealth: Payer: Self-pay | Admitting: Physician Assistant

## 2020-02-24 NOTE — Telephone Encounter (Signed)
Please let her know the pulmonologist sent his consult note from yesterday.  His only mention of Xanax is that wonders if she can ever get off it.  At our appointment a few days ago, she was doing well and I do not believe mentioned that the anxiety was worse.  She has never been on any other benzodiazepine that I know of.  If the anxiety is not controlled, we could change.  But I have not felt like that was necessary.  What does she think now?  If we change, I would recommend Klonopin.

## 2020-02-24 NOTE — Telephone Encounter (Signed)
Patient sent email   Good Morning,   I seen you yesterday & we discussed my sleep study. I received a call late afternoon from cardiologist who stated I had obstructive sleep apnea. She stated that I quit breathing 8 1/2 times a minute & my oxygen level dropped 84%. So now I've got to have another test where I stay overnight in the lab. UGH!!! I am going to call psychiatrist about Xanax. Thank you for a thorough visit yesterday.   Gattman    Routing to Dr. Shearon Stalls and Juluis Rainier

## 2020-02-24 NOTE — Telephone Encounter (Signed)
Pt called to report she had an apt with Pulmonologist for shortness of breath. While there they talked about anxiety and doctor stated if you have been on xanax for a long time may not be as effective for anxiety.  May need to switch to some other med for a while. Pt wanted to get your advise and if you want to see her sooner. Contact # [phone]

## 2020-02-25 ENCOUNTER — Telehealth: Payer: Self-pay | Admitting: Cardiology

## 2020-02-25 NOTE — Telephone Encounter (Signed)
Noted  

## 2020-02-25 NOTE — Telephone Encounter (Signed)
Pt was called and advise information. Pt agreed she would leave meds as is at this time. Reason for mentioning was due to doctor mentioned shortness of breath could be coming from anxiety. Pt did state some days she has to take 3/d Xanax and other days 2/d. She averages out and does not run out too soon. Pt will report if anxiety worsens. Also, mentioned she has not taken Pramprexall (Mirapex) for months. No longer having tremors and no restless legs.

## 2020-02-25 NOTE — Telephone Encounter (Signed)
PA pending #[id number]

## 2020-02-25 NOTE — Telephone Encounter (Signed)
Noted. Thanks Tammy.

## 2020-02-26 ENCOUNTER — Encounter: Payer: Self-pay | Admitting: Physician Assistant

## 2020-03-02 NOTE — Telephone Encounter (Signed)
PA approved for 04-10-20 thru 07-08-20.  Called and LM that the patient is scheduled for Feb. 28 at [facility] at 8 pm.  I left the sleep lab number as well.

## 2020-03-13 NOTE — Telephone Encounter (Signed)
Dr. Shearon Stalls, Please see patient comment regarding sob and advise.  Thank you.

## 2020-03-21 ENCOUNTER — Ambulatory Visit: Payer: 59 | Admitting: Gastroenterology

## 2020-03-21 ENCOUNTER — Telehealth: Payer: Self-pay | Admitting: Physician Assistant

## 2020-03-21 NOTE — Telephone Encounter (Signed)
Kristen [name] next appt is 05/12/20. Kristen [name] is still having anxiety attacks with the Xanax she is taking

## 2020-03-22 ENCOUNTER — Other Ambulatory Visit: Payer: Self-pay | Admitting: Physician Assistant

## 2020-03-22 MED ORDER — HYDROXYZINE HCL 10 MG PO TABS
10.0000 mg | ORAL_TABLET | Freq: Three times a day (TID) | ORAL | 0 refills | Status: DC | PRN
Start: 2020-03-22 — End: 2020-04-11

## 2020-03-22 NOTE — Telephone Encounter (Signed)
Have her increase the Zoloft from total of 200 mg to 250 mg/d (2 1/2 pills). I don't see that we've ever tried hydroxyzine for anxiety. Please double check with her. If not, I'll send it in, to add on top of other meds, as needed. If she has tried in the past and it wasn't effective, we can increase Xanax to total of 3 1/2 mg daily, but I'd prefer not to do that. She's already on a high dose.

## 2020-03-22 NOTE — Telephone Encounter (Signed)
I gave her the instructions.She also says she has not tried hydroxyzine before.

## 2020-03-22 NOTE — Telephone Encounter (Signed)
Reviewed thanks! 

## 2020-03-22 NOTE — Telephone Encounter (Signed)
She has been called and notified

## 2020-03-22 NOTE — Telephone Encounter (Signed)
Please let her know that I sent in hydroxyzine to [facility] in [place].  She can use it for anxiety instead of Xanax or in between Xanax doses if needed.  Thank you.

## 2020-03-25 DIAGNOSIS — R1901 Right upper quadrant abdominal swelling, mass and lump: Secondary | ICD-10-CM | POA: Insufficient documentation

## 2020-03-29 ENCOUNTER — Other Ambulatory Visit: Payer: Self-pay | Admitting: Physician Assistant

## 2020-03-29 ENCOUNTER — Other Ambulatory Visit (HOSPITAL_COMMUNITY): Payer: Self-pay | Admitting: Physician Assistant

## 2020-03-30 ENCOUNTER — Other Ambulatory Visit (HOSPITAL_COMMUNITY): Payer: Self-pay | Admitting: Physician Assistant

## 2020-03-30 DIAGNOSIS — R1900 Intra-abdominal and pelvic swelling, mass and lump, unspecified site: Secondary | ICD-10-CM

## 2020-04-07 ENCOUNTER — Other Ambulatory Visit: Payer: Self-pay

## 2020-04-07 ENCOUNTER — Ambulatory Visit (HOSPITAL_COMMUNITY)
Admission: RE | Admit: 2020-04-07 | Discharge: 2020-04-07 | Disposition: A | Discharge status: Home / Self Care | Payer: 59 | Source: Ambulatory Visit | Attending: Physician Assistant | Admitting: Physician Assistant

## 2020-04-07 DIAGNOSIS — R1900 Intra-abdominal and pelvic swelling, mass and lump, unspecified site: Secondary | ICD-10-CM | POA: Insufficient documentation

## 2020-04-08 LAB — HEPATIC FUNCTION PANEL
AG Ratio: 2.1 (calc) (ref 1.0–2.5)
ALT: 20 U/L (ref 6–29)
AST: 17 U/L (ref 10–35)
Albumin: 4.4 g/dL (ref 3.6–5.1)
Alkaline phosphatase (APISO): 84 U/L (ref 31–125)
Bilirubin, Direct: 0.1 mg/dL (ref 0.0–0.2)
Globulin: 2.1 g/dL (calc) (ref 1.9–3.7)
Indirect Bilirubin: 0.2 mg/dL (calc) (ref 0.2–1.2)
Total Bilirubin: 0.3 mg/dL (ref 0.2–1.2)
Total Protein: 6.5 g/dL (ref 6.1–8.1)

## 2020-04-10 ENCOUNTER — Ambulatory Visit: Payer: 59 | Attending: Cardiology | Admitting: Cardiology

## 2020-04-10 ENCOUNTER — Other Ambulatory Visit: Payer: Self-pay

## 2020-04-10 DIAGNOSIS — G4733 Obstructive sleep apnea (adult) (pediatric): Secondary | ICD-10-CM | POA: Diagnosis not present

## 2020-04-10 DIAGNOSIS — G479 Sleep disorder, unspecified: Secondary | ICD-10-CM

## 2020-04-11 ENCOUNTER — Other Ambulatory Visit: Payer: Self-pay | Admitting: Physician Assistant

## 2020-04-12 NOTE — Procedures (Signed)
   Patient Name: Kristen [name], Kristen [name] Date: 04/10/2020 Gender: Female D.O.B: 04/21/1970 Age (years): 23 Referring Provider: Gwyndolyn Kaufman MD Height (inches): 66 Interpreting Physician: Fransico Him MD, ABSM Weight (lbs): 243 RPSGT: Rosebud Poles BMI: 39 MRN: [id number] Neck Size: 17.00  CLINICAL INFORMATION The patient is referred for a CPAP titration to treat sleep apnea.  SLEEP STUDY TECHNIQUE As per the AASM Manual for the Scoring of Sleep and Associated Events v2.3 (April 2016) with a hypopnea requiring 4% desaturations.  The channels recorded and monitored were frontal, central and occipital EEG, electrooculogram (EOG), submentalis EMG (chin), nasal and oral airflow, thoracic and abdominal wall motion, anterior tibialis EMG, snore microphone, electrocardiogram, and pulse oximetry. Continuous positive airway pressure (CPAP) was initiated at the beginning of the study and titrated to treat sleep-disordered breathing.  MEDICATIONS Medications self-administered by patient taken the night of the study : N/A  TECHNICIAN COMMENTS Comments added by technician: Patient tolerated CPAP farily well. CPAP therapy started at 4 cm of H2O, increased to 11 cm of H2O. Therapy increased from 8-11 cm of H2O due to moderate to loud snoring episodes. Significant leaks during later part of study, due to nasal congestion upon awakening Comments added by scorer: N/A  RESPIRATORY PARAMETERS Optimal PAP Pressure (cm):11  AHI at Optimal Pressure (/hr):N/A Overall Minimal O2 (%):87.00  Supine % at Optimal Pressure (%):N/A Minimal O2 at Optimal Pressure (%): 87.00   SLEEP ARCHITECTURE The study was initiated at 9:25:46 PM and ended at 4:52:42 AM.  Sleep onset time was 15.8 minutes and the sleep efficiency was 88.6%. The total sleep time was 396 minutes.  The patient spent 3.28% of the night in stage N1 sleep, 86.87% in stage N2 sleep, 0.00% in stage N3 and 9.9% in REM.Stage REM latency was  208.0 minutes  Wake after sleep onset was 35.2. Alpha intrusion was absent. Supine sleep was 88.13%.  CARDIAC DATA The 2 lead EKG demonstrated sinus rhythm. The mean heart rate was 69.70 beats per minute. Other EKG findings include: None.  LEG MOVEMENT DATA The total Periodic Limb Movements of Sleep (PLMS) were 0. The PLMS index was 0.00. A PLMS index of <15 is considered normal in adults.  IMPRESSIONS - The optimal PAP pressure was 11 cm of water. - Mild oxygen desaturations were observed during this titration (min O2 = 87.00%). - The patient snored with moderate snoring volume during this titration study. - No cardiac abnormalities were observed during this study. - Clinically significant periodic limb movements were not noted during this study. Arousals associated with PLMs were rare.  DIAGNOSIS - Obstructive Sleep Apnea (G47.33)  RECOMMENDATIONS - Trial of CPAP therapy on 11 cm H2O with a Medium size Resmed Full Face Mask AirFit F20 mask and heated humidification. - Avoid alcohol, sedatives and other CNS depressants that may worsen sleep apnea and disrupt normal sleep architecture. - Sleep hygiene should be reviewed to assess factors that may improve sleep quality. - Weight management and regular exercise should be initiated or continued. - Return to [facility] for re-evaluation after 6 weeks of therapy  [Electronically signed] 04/12/2020 09:46 PM  Fransico Him MD, ABSM Diplomate, American Board of Sleep Medicine

## 2020-04-17 ENCOUNTER — Other Ambulatory Visit: Payer: Self-pay

## 2020-04-17 ENCOUNTER — Ambulatory Visit: Payer: 59 | Admitting: Internal Medicine

## 2020-04-17 ENCOUNTER — Encounter: Payer: Self-pay | Admitting: Internal Medicine

## 2020-04-17 ENCOUNTER — Ambulatory Visit (INDEPENDENT_AMBULATORY_CARE_PROVIDER_SITE_OTHER): Payer: 59 | Admitting: Internal Medicine

## 2020-04-17 VITALS — BP 115/80 | HR 81 | Temp 97.5°F | Ht 65.5 in | Wt 244.8 lb

## 2020-04-17 DIAGNOSIS — J309 Allergic rhinitis, unspecified: Secondary | ICD-10-CM | POA: Diagnosis not present

## 2020-04-17 DIAGNOSIS — R0602 Shortness of breath: Secondary | ICD-10-CM

## 2020-04-17 DIAGNOSIS — R053 Chronic cough: Secondary | ICD-10-CM | POA: Diagnosis not present

## 2020-04-17 LAB — PULMONARY FUNCTION TEST
DL/VA % pred: 104 %
DL/VA: 4.41 ml/min/mmHg/L
DLCO cor % pred: 97 %
DLCO cor: 22.49 ml/min/mmHg
DLCO unc % pred: 97 %
DLCO unc: 22.49 ml/min/mmHg
FEF 25-75 Post: 4.3 L/sec
FEF 25-75 Pre: 3.42 L/sec
FEF2575-%Change-Post: 25 %
FEF2575-%Pred-Post: 145 %
FEF2575-%Pred-Pre: 115 %
FEV1-%Change-Post: 4 %
FEV1-%Pred-Post: 99 %
FEV1-%Pred-Pre: 95 %
FEV1-Post: 3.06 L
FEV1-Pre: 2.93 L
FEV1FVC-%Change-Post: 4 %
FEV1FVC-%Pred-Pre: 104 %
FEV6-%Change-Post: 0 %
FEV6-%Pred-Post: 92 %
FEV6-%Pred-Pre: 92 %
FEV6-Post: 3.49 L
FEV6-Pre: 3.48 L
FEV6FVC-%Change-Post: 0 %
FEV6FVC-%Pred-Post: 102 %
FEV6FVC-%Pred-Pre: 102 %
FVC-%Change-Post: 0 %
FVC-%Pred-Post: 90 %
FVC-%Pred-Pre: 89 %
FVC-Post: 3.49 L
FVC-Pre: 3.48 L
Post FEV1/FVC ratio: 88 %
Post FEV6/FVC ratio: 100 %
Pre FEV1/FVC ratio: 84 %
Pre FEV6/FVC Ratio: 100 %
RV % pred: 91 %
RV: 1.73 L
TLC % pred: 97 %
TLC: 5.29 L

## 2020-04-17 MED ORDER — ALBUTEROL SULFATE HFA 108 (90 BASE) MCG/ACT IN AERS: 2.0000 | INHALATION_SPRAY | Freq: Four times a day (QID) | RESPIRATORY_TRACT | 2 refills | Status: DC | PRN

## 2020-04-17 NOTE — Progress Notes (Signed)
       Kristen [name]    [id number]    11/10/1970  Primary Care Physician:Sasser, Paul W, MD Date of Appointment: 04/17/2020 Established Patient Visit  Chief complaint:   Chief Complaint  Patient presents with  . Follow-up    Get pft results     HPI: Kristen [name] is a 49 y.o. woman with history of previous tobacco use disorder here from shortness of breath.   Interval Updates: Here for follow up with PFTs for cough and shortness of breath. Cough is worst in the morning and brings up phlegm.  No blood. She is taking albuterol from her husband a couple times a day and seems to help her.   She has been getting nasal massage done and says that her sinuses felt much better after this. Still doing neti pot, flonase as needed, anti-histamine.  Cough not waking her up as much.   She did have nasal sinus surgery many years ago. She feels that this is where her cough is coming from.   Has appointment with GI doctor next week to follow up on reflux. She is still on protonix twice a day.   I have reviewed the patient's family social and past medical history and updated as appropriate.   Past Medical History:  Diagnosis Date  . Allergies   . Anemia   . Anxiety   . Arthritis   . Atypical mole 11/03/2006   mid upper back (slight to moderate)  . Atypical mole 11/03/2006   lower right back (slight to moderate)  . Back pain   . Bipolar 1 disorder (HCC)   . Borderline diabetic   . Chronic constipation   . Constipation   . Depression   . Family history of adverse reaction to anesthesia    mother-- ponv  . Fatty liver   . GAD (generalized anxiety disorder)   . GERD (gastroesophageal reflux disease)   . Hiatal hernia   . High triglycerides   . History of kidney stones   . History of recurrent UTIs   . History of sepsis 06/2018  . History of stomach ulcers   . History of suicidal ideation   . Hyperlipidemia   . Hypothyroidism   . IDA (iron deficiency anemia)   .  Joint pain   . Melanoma (HCC) 11/03/2006   left chest in situ (excision)  . Orthostasis   . Palpitations   . PONV (postoperative nausea and vomiting)   . Prediabetes   . Rapid heart rate   . Renal calculus, left   . Seasonal allergies   . Shortness of breath   . Sleep disorder, unspecified    per excessive sleeping during the day  . Snoring   . Swallowing difficulty   . Tachycardia   . Vertigo   . Vitamin D deficiency   . Weakness   . Wears contact lenses     Past Surgical History:  Procedure Laterality Date  . ANTERIOR CERVICAL DECOMP/DISCECTOMY FUSION  02/17/2012   Procedure: ANTERIOR CERVICAL DECOMPRESSION/DISCECTOMY FUSION 2 LEVELS;  Surgeon: Robert W Nudelman, MD;  Location: [facility];  Service: Neurosurgery;  Laterality: N/A;  Cervical four-five and Cervical six-seven anterior cervial decompression with fusion plating and bonegraft  . ANTERIOR CERVICAL DECOMP/DISCECTOMY FUSION  04-01-2001   @[facility]   C5---6  . BIOPSY  07/20/2018   Procedure: BIOPSY;  Surgeon: Rourk, Robert M, MD;  Location: [facility];  Service: Endoscopy;;  gastric   . BREAST   REDUCTION SURGERY Bilateral 1995  . CESAREAN SECTION  x2   last one 1998   with BILATERAL TUBAL LIGATION  . CESAREAN SECTION WITH BILATERAL TUBAL LIGATION    . COLONOSCOPY WITH PROPOFOL N/A 07/20/2018   Dr. Gala Romney: Diverticulosis, internal hemorrhoids, 5 mm splenic flexure tubular adenoma removed.  . CYSTOSCOPY/URETEROSCOPY/HOLMIUM LASER/STENT PLACEMENT Left 08/21/2018   Procedure: CYSTOSCOPY LEFT RETROGRADE PYELOGRAM /URETEROSCOPY/HOLMIUM LASER/STENT PLACEMENT;  Surgeon: Ceasar Mons, MD;  Location: [facility];  Service: Urology;  Laterality: Left;  . DILATION AND CURETTAGE OF UTERUS  1992   for miscarriage  . ENDOMETRIAL ABLATION  2014  . ESOPHAGOGASTRODUODENOSCOPY     20 years ago.   Marland Kitchen ESOPHAGOGASTRODUODENOSCOPY (EGD) WITH PROPOFOL N/A 07/20/2018   Dr. Gala Romney: Small hiatal hernia, erythematous mucosa in  the stomach with a healing gastric ulcer measuring 1 cm, biopsies negative.  Marland Kitchen [place]   unilateral for infertility  . NASAL SINUS SURGERY  2010  approx.  Marland Kitchen POLYPECTOMY  07/20/2018   Procedure: POLYPECTOMY;  Surgeon: Daneil Dolin, MD;  [facility]: [facility];  Service: Endoscopy;;  . POSTERIOR CERVICAL FUSION/FORAMINOTOMY N/A 08/18/2013   Procedure: CERVICAL FOUR TO CERVICAL SEVEN POSTERIOR CERVICAL FUSION/FORAMINOTOMY LEVEL 3;  Surgeon: Hosie Spangle, MD;  Location: [facility];  Service: Neurosurgery;  Laterality: N/A;  C4-7 posterior cervical fusion with lateral mass fixation  . RIGHT/LEFT HEART CATH AND CORONARY ANGIOGRAPHY N/A 12/23/2019   Procedure: RIGHT/LEFT HEART CATH AND CORONARY ANGIOGRAPHY;  Surgeon: Burnell Blanks, MD;  Location: [place] [facility];  Service: Cardiovascular;  Laterality: N/A;    Family History  Problem Relation Age of Onset  . Asthma Mother   . Rheum arthritis Mother   . Non-Hodgkin's lymphoma Mother   . Congestive Heart Failure Mother   . Atrial fibrillation Mother   . Diabetes Mother   . Hyperlipidemia Mother   . Cancer Mother   . Colon cancer Father   . Multiple myeloma Father   . Hypertension Father   . Cancer Father   . Depression Father   . Anxiety disorder Father   . Alcoholism Father   . Obesity Father   . Allergies Other        entire family(mom and dad)  . Diabetes Maternal Grandmother   . Congestive Heart Failure Maternal Grandmother   . Mental illness Paternal Grandmother   . Colon cancer Paternal Grandfather   . Bone cancer Paternal Grandfather     Social History   Occupational History  . Occupation: disability  Tobacco Use  . Smoking status: Former Smoker    Packs/day: 1.00    Years: 5.00    Pack years: 5.00    Types: Cigarettes    Quit date: 02/09/2002    Years since quitting: 18.1  . Smokeless tobacco: Never Used  Vaping Use  . Vaping Use: Never used  Substance and Sexual Activity  .  Alcohol use: No  . Drug use: No  . Sexual activity: Yes    Birth control/protection: Surgical     Physical Exam: Blood pressure 115/80, pulse 81, temperature (!) 97.5 F (36.4 C), temperature source Temporal, height 5' 5.5" (1.664 m), weight 244 lb 12.8 oz (111 kg), SpO2 99 %.  Gen:      No acute distress ENT:  * nasal polyps, mucus membranes moist Lungs:    No increased respiratory effort, symmetric chest wall excursion, clear to auscultation bilaterally, no wheezes or crackles CV:  Regular rate and rhythm; no murmurs, rubs, or gallops.  No pedal edema   Data Reviewed: Imaging: I have personally reviewed the chest xray from July 2021 which shows no acute cardiopulmonary process.   PFTs:  PFT Results Latest Ref Rng & Units 04/17/2020  FVC-Pre L 3.48  FVC-Predicted Pre % 89  FVC-Post L 3.49  FVC-Predicted Post % 90  Pre FEV1/FVC % % 84  Post FEV1/FCV % % 88  FEV1-Pre L 2.93  FEV1-Predicted Pre % 95  FEV1-Post L 3.06  DLCO uncorrected ml/min/mmHg 22.49  DLCO UNC% % 97  DLCO corrected ml/min/mmHg 22.49  DLCO COR %Predicted % 97  DLVA Predicted % 104  TLC L 5.29  TLC % Predicted % 97  RV % Predicted % 91   I have personally reviewed the patient's PFTs and normal spirometry, lung volumes, diffusion capacity.   Labs:  Immunization status: Immunization History  Administered Date(s) Administered  . Influenza,inj,Quad PF,6+ Mos 11/11/2015  . Influenza-Unspecified 12/01/2019  . Moderna Sars-Covid-2 Vaccination 04/07/2019, 05/05/2019, 12/13/2019    Assessment:  Chronic Cough Shortness of breath Chronic Rhinitis GERD  Plan/Recommendations:  Continue albuterol as needed. Will prescribe today.  Continue PPI BID Continue flonase, neti pot.  I recommend she follow up with ENT - She has seen Dr. Shoemaker in the past. We discussed that her cough is likely multifactorial from chronic rhinitis and reflux.  I think starting an ICS-LABA inhaler would be low yield in  helping her symptoms, although that is something we can discuss in the future. Albuterol use is fairly infrequent right now. She will follow up with ENT and GI and follow up with us again in 6 months.    Return to Care: Return in about 6 months (around 10/18/2020).    , MD Pulmonary and Critical Care Medicine [facility] Office:[phone] 

## 2020-04-17 NOTE — Progress Notes (Signed)
PFT done today. 

## 2020-04-17 NOTE — Patient Instructions (Signed)
The patient should have follow up scheduled with myself in 6 months.   Follow up with ENT and  Allergy about your sinuses.  Keep taking nasal rinses with neti pot - in distilled or boiled water.  Flonase - 1 spray on each side of your nose twice a day for first week, then 1 spray on each side.   Instructions for use:  If you also use a saline nasal spray or rinse, use that first.  Position the head with the chin slightly tucked. Use the right hand to spray into the left nostril and the right hand to spray into the left nostril.   Point the bottle away from the septum of your nose (cartilage that divides the two sides of your nose).   Hold the nostril closed on the opposite side from where you will spray  Spray once and gently sniff to pull the medicine into the higher parts of your nose.  Don't sniff too hard as the medicine will drain down the back of your throat instead.  Repeat with a second spray on the same side if prescribed.  Repeat on the other side of your nose.

## 2020-04-18 ENCOUNTER — Telehealth: Payer: Self-pay | Admitting: *Deleted

## 2020-04-18 NOTE — Telephone Encounter (Signed)
Informed patient of sleep study results and patient understanding was verbalized. Patient understands her sleep study showed they had a successful PAP titration and let DME know that orders are in EPIC. Please set up 6 week OV with me.    Upon patient request DME selection is [facility]. Patient understands she/he will be contacted by [facility] to set up her/he cpap. Patient understands to call if [facility] does not contact her/he with new setup in a timely manner. Patient understands they will be called once confirmation has been received from [facility] that they have received their new machine to schedule 10 week follow up appointment.   [facility] notified of new cpap order  Please add to airview Patient was grateful for the call and thanked me.

## 2020-04-18 NOTE — Telephone Encounter (Signed)
-----   Message from Sueanne Margarita, MD sent at 04/12/2020  9:48 PM EST ----- Please let patient know that they had a successful PAP titration and let DME know that orders are in EPIC.  Please set up 6 week OV with me.

## 2020-04-21 ENCOUNTER — Ambulatory Visit: Payer: 59 | Admitting: Gastroenterology

## 2020-05-08 ENCOUNTER — Other Ambulatory Visit: Payer: Self-pay | Admitting: Physician Assistant

## 2020-05-08 ENCOUNTER — Telehealth: Payer: Self-pay | Admitting: Physician Assistant

## 2020-05-08 MED ORDER — ALPRAZOLAM 1 MG PO TABS: ORAL_TABLET | ORAL | 1 refills | Status: DC

## 2020-05-08 NOTE — Telephone Encounter (Signed)
Please review and send if appropriate

## 2020-05-08 NOTE — Telephone Encounter (Signed)
Pt's prescription for Xanax was changed last month to take 1 and a 1/2 tid. Pt is out and would like a new rx sent to [facility] in [place].

## 2020-05-08 NOTE — Telephone Encounter (Signed)
Prescription was sent

## 2020-05-12 ENCOUNTER — Encounter: Payer: Self-pay | Admitting: Physician Assistant

## 2020-05-12 ENCOUNTER — Other Ambulatory Visit: Payer: 59 | Admitting: *Deleted

## 2020-05-12 ENCOUNTER — Other Ambulatory Visit: Payer: Self-pay

## 2020-05-12 ENCOUNTER — Ambulatory Visit (INDEPENDENT_AMBULATORY_CARE_PROVIDER_SITE_OTHER): Payer: 59 | Admitting: Physician Assistant

## 2020-05-12 DIAGNOSIS — F411 Generalized anxiety disorder: Secondary | ICD-10-CM

## 2020-05-12 DIAGNOSIS — G4733 Obstructive sleep apnea (adult) (pediatric): Secondary | ICD-10-CM

## 2020-05-12 DIAGNOSIS — Z634 Disappearance and death of family member: Secondary | ICD-10-CM | POA: Diagnosis not present

## 2020-05-12 DIAGNOSIS — F5105 Insomnia due to other mental disorder: Secondary | ICD-10-CM | POA: Diagnosis not present

## 2020-05-12 DIAGNOSIS — F99 Mental disorder, not otherwise specified: Secondary | ICD-10-CM

## 2020-05-12 DIAGNOSIS — E781 Pure hyperglyceridemia: Secondary | ICD-10-CM

## 2020-05-12 DIAGNOSIS — Z79899 Other long term (current) drug therapy: Secondary | ICD-10-CM

## 2020-05-12 DIAGNOSIS — F319 Bipolar disorder, unspecified: Secondary | ICD-10-CM | POA: Diagnosis not present

## 2020-05-12 NOTE — Progress Notes (Signed)
[facility] Med Check  Patient ID: Kristen [name],  MRN: [id number]  PCP: Manon Hilding, MD  Date of Evaluation: 05/12/2020 Time spent:40 minutes  Chief Complaint:  Chief Complaint    Anxiety; Depression; Insomnia; Follow-up      HISTORY/CURRENT STATUS: HPI For routine med check.  Not doing well. Still grieving the loss of her mom, the 1st anniversary of her death is coming up. The 2nd anniversary of her dad's death  Is coming up. Mother's Day is coming up. Her youngest son moved out. Her son in [place] in [employer], just announced that he and GF are pregnant. She cries a lot, or sits and does nothing. Is thinking about getting a dog to give her comfort. Not sleeping well. Sleep study shows apnea and waiting on getting a CPAP. Does use Sonata qhs but rarely for MNA. Xanax dose was increased since LOV Helps some. Doesn't get out much. Adamantly denies SI/HI.   Patient denies increased energy with decreased need for sleep, no increased talkativeness, no racing thoughts, no impulsivity or risky behaviors, no increased spending, no increased libido, no grandiosity, no paranoia, and no hallucinations.   Denies dizziness, syncope, seizures, numbness, tingling, tremor, tics, unsteady gait, slurred speech, confusion. Denies muscle or joint pain, stiffness, or dystonia.  Individual Medical History/ Review of Systems: Changes? :Yes  see HPI  Past medications for mental health diagnoses include: Prozac, Paxil, Lexapro, Celexa, Zoloft, Viibryd, Effexor, Cymbalta, Risperdal, Latuda, Lamictal, VPA, Lithium-became toxic,Xanax, Wellbutrin, Abilify, Willette [place], Strattera, Kapvay, Saphris, Adderall, Vyvanse, Lunesta, Sonata,  Vraylar, Seroquel, Geodon, Tegretal  Allergies: Iohexol and Rosuvastatin  Current Medications:  Current Outpatient Medications:  .  acetaminophen (TYLENOL) 500 MG tablet, Take 2,000 mg by mouth every 6 (six) hours as needed (pain.)., Disp: , Rfl:  .  albuterol (VENTOLIN HFA) 108  (90 Base) MCG/ACT inhaler, Inhale 2 puffs into the lungs every 6 (six) hours as needed for wheezing or shortness of breath., Disp: 8 g, Rfl: 2 .  ALPRAZolam (XANAX) 1 MG tablet, 1 p.o. 3 times daily as needed and may take an extra 0.5 pill during the day as needed occasionally., Disp: 135 tablet, Rfl: 1 .  aspirin EC 81 MG tablet, Take 1 tablet (81 mg total) by mouth daily. Swallow whole., Disp: 30 tablet, Rfl: 11 .  buPROPion (WELLBUTRIN XL) 150 MG 24 hr tablet, TAKE 3 TABLETS BY MOUTH ONCE DAILY IN THE MORNING, Disp: 90 tablet, Rfl: 5 .  ezetimibe (ZETIA) 10 MG tablet, Take 1 tablet (10 mg total) by mouth daily., Disp: 90 tablet, Rfl: 3 .  fenofibrate (TRICOR) 145 MG tablet, Take 1 tablet (145 mg total) by mouth daily., Disp: 30 tablet, Rfl: 11 .  fluorometholone (FML) 0.1 % ophthalmic suspension, Place 1 drop into both eyes 4 (four) times daily as needed (dry/irritated eyes.)., Disp: , Rfl:  .  hydrOXYzine (ATARAX/VISTARIL) 10 MG tablet, TAKE 1 TO 3 TABLETS BY MOUTH THREE TIMES DAILY AS NEEDED, Disp: 90 tablet, Rfl: 11 .  icosapent Ethyl (VASCEPA) 1 g capsule, Take 2 capsules (2 g total) by mouth 2 (two) times daily., Disp: 360 capsule, Rfl: 3 .  lamoTRIgine (LAMICTAL) 200 MG tablet, TAKE 2 & 1/2 (TWO & ONE-HALF) TABLETS BY MOUTH ONCE DAILY, Disp: 75 tablet, Rfl: 5 .  levocetirizine (XYZAL) 5 MG tablet, Take 5 mg by mouth at bedtime. , Disp: , Rfl:  .  levothyroxine (SYNTHROID) 75 MCG tablet, Take 75 mcg by mouth daily before breakfast. , Disp: , Rfl:  .  LINZESS 290 MCG CAPS capsule, TAKE 1 CAPSULE BY MOUTH ONCE DAILY BEFORE BREAKFAST (Patient taking differently: Take 290 mcg by mouth daily before breakfast.), Disp: 30 capsule, Rfl: 11 .  meclizine (ANTIVERT) 25 MG tablet, Take 25 mg by mouth 3 (three) times daily as needed for dizziness., Disp: , Rfl:  .  montelukast (SINGULAIR) 10 MG tablet, Take 10 mg by mouth daily. , Disp: , Rfl:  .  ondansetron (ZOFRAN) 4 MG tablet, TAKE 1 TABLET BY MOUTH  EVERY 6 HOURS AS NEEDED FOR NAUSEA OR VOMITING (Patient taking differently: Take 4 mg by mouth every 6 (six) hours as needed for nausea or vomiting.), Disp: 30 tablet, Rfl: 3 .  Oxcarbazepine (TRILEPTAL) 300 MG tablet, Take 1 tablet (300 mg total) by mouth 2 (two) times daily., Disp: 60 tablet, Rfl: 5 .  pantoprazole (PROTONIX) 40 MG tablet, Take 1 tablet (40 mg total) by mouth 2 (two) times daily with a meal., Disp: 180 tablet, Rfl: 3 .  rosuvastatin (CRESTOR) 5 MG tablet, Take 1 tablet (5 mg total) by mouth daily., Disp: 30 tablet, Rfl: 11 .  sertraline (ZOLOFT) 100 MG tablet, Take 2 tablets (200 mg total) by mouth at bedtime. (Patient taking differently: Take 250 mg by mouth at bedtime.), Disp: 60 tablet, Rfl: 11 .  zaleplon (SONATA) 10 MG capsule, TAKE 1 CAPSULE BY MOUTH AT BEDTIME AS NEEDED, MAY REPEAT 1 FOR MIDNOCTURNAL AWAKENING AS NEEDED IF THREE HOURS LEFT TO SLEEP, Disp: 60 capsule, Rfl: 5 .  cetirizine (ZYRTEC) 10 MG tablet, Take 10 mg by mouth daily. (Patient not taking: Reported on 05/12/2020), Disp: , Rfl:  .  metFORMIN (GLUCOPHAGE) 500 MG tablet, Take 1 tablet (500 mg total) by mouth 2 (two) times daily with a meal. (Patient not taking: Reported on 05/12/2020), Disp: 60 tablet, Rfl: 0 .  metoprolol succinate (TOPROL-XL) 25 MG 24 hr tablet, Take 25 mg by mouth in the morning and at bedtime.  (Patient not taking: Reported on 05/12/2020), Disp: , Rfl:  .  pramipexole (MIRAPEX) 1 MG tablet, Take 1 tablet (1 mg total) by mouth 2 (two) times daily. (Patient taking differently: Take 2 mg by mouth at bedtime.), Disp: 60 tablet, Rfl: 5 .  torsemide (DEMADEX) 20 MG tablet, Take 20 mg by mouth daily., Disp: , Rfl:  Medication Side Effects: none  Family Medical/ Social History: Changes?  See above  MENTAL HEALTH EXAM:  There were no vitals taken for this visit.There is no height or weight on file to calculate BMI.  General Appearance: Casual, Neat, Well Groomed and Obese  Eye Contact:  Good  Speech:   Clear and Coherent and Normal Rate  Volume:  Normal  Mood:  sad  Affect:  Appropriate  Thought Process:  Goal Directed and Descriptions of Associations: Intact  Orientation:  Full (Time, Place, and Person)  Thought Content: Logical   Suicidal Thoughts:  No  Homicidal Thoughts:  No  Memory:  WNL  Judgement:  Good  Insight:  Good  Psychomotor Activity:  Normal  Concentration:  Concentration: Good and Attention Span: Good  Recall:  Good  Fund of Knowledge: Good  Language: Good  Assets:  Desire for Improvement  ADL's:  Intact  Cognition: WNL  Prognosis:  Good   Most recent pertinent labs: 03/25/2020 CBC WBC 18.3 (always has elevated count. See previous notes. She sees Hem/Onc) CMP nl   DIAGNOSES:    ICD-10-CM   1. Bipolar I disorder ([place])  F31.9   2. Generalized anxiety disorder  F41.1   3. Bereavement  Z63.4   4. Insomnia due to other mental disorder  F51.05    F99   5. Polypharmacy  Z79.899   6. Obstructive sleep apnea  G47.33     Receiving Psychotherapy: Yes  Felix Ahmadi every 2 weeks. And grief counselor.    RECOMMENDATIONS:  PDMP was reviewed. I provided 40 minutes of face to face time during this encounter, including time spent reviewing her recent labs, other notes and charting.  Unfortunately everything she is feeling I believe is due to circumstances, grieving her mom and dad's death over the past 2 years.  Plus knowing she has her first grandchild due this fall from a relationship that has already started [place] between her son and the mother of the baby.  Just a lot going on. Continue Xanax 1 mg, 1 p.o. 3 times daily as needed and may repeat 1/2 pill during the day as needed.  Keep at lowest dose possible.  Continue Zoloft 100 mg, 2.5  p.o. daily. Continue Wellbutrin XL 150 mg, 3 p.o. every morning. Continue Trileptal 300 mg, 1 p.o. twice daily. Continue Lamictal 200 mg, 2.5 pills daily. Continue Sonata 10 mg, 1 p.o. nightly as needed and may repeat 1 as  needed for mid nocturnal awakening as long as she has 3 hours left to sleep. Continue counseling. Return in 2 months.  [name], PA-C

## 2020-05-13 LAB — HEPATIC FUNCTION PANEL
ALT: 39 IU/L — ABNORMAL HIGH (ref 0–32)
AST: 34 IU/L (ref 0–40)
Albumin: 4.6 g/dL (ref 3.8–4.8)
Alkaline Phosphatase: 102 IU/L (ref 44–121)
Bilirubin Total: 0.2 mg/dL (ref 0.0–1.2)
Bilirubin, Direct: 0.11 mg/dL (ref 0.00–0.40)
Total Protein: 7.1 g/dL (ref 6.0–8.5)

## 2020-05-13 LAB — LIPID PANEL
Chol/HDL Ratio: 4.4 ratio (ref 0.0–4.4)
Cholesterol, Total: 181 mg/dL (ref 100–199)
HDL: 41 mg/dL (ref >39)
LDL Chol Calc (NIH): 92 mg/dL (ref 0–99)
Triglycerides: 289 mg/dL — ABNORMAL HIGH (ref 0–149)
VLDL Cholesterol Cal: 48 mg/dL — ABNORMAL HIGH (ref 5–40)

## 2020-05-15 ENCOUNTER — Other Ambulatory Visit: Payer: Self-pay | Admitting: Physician Assistant

## 2020-05-15 ENCOUNTER — Other Ambulatory Visit: Payer: 59

## 2020-05-15 ENCOUNTER — Other Ambulatory Visit: Payer: Self-pay | Admitting: Cardiology

## 2020-05-15 ENCOUNTER — Telehealth: Payer: Self-pay | Admitting: Pharmacist

## 2020-05-15 MED ORDER — ROSUVASTATIN CALCIUM 10 MG PO TABS: 10.0000 mg | ORAL_TABLET | Freq: Every day | ORAL | 11 refills | Status: DC

## 2020-05-15 NOTE — Telephone Encounter (Signed)
Spoke with pt about lipid panel results. She is adherent to rosuvastatin 5mg  daily, ezetimibe 10mg  daily, fenofibrate 145mg  daily, and Vascepa 2g BID. Previously intolerant to rosuvastatin 20mg  daily but she is willing to try increasing her dose to 10mg  to see if she tolerates it. Will continue other meds. She has started seeing a nutritionist 2 weeks ago and has lost 5 pounds so far. She's focusing on a high protein, low carb diet with more fruits and vegetables. She's also been trying to walk 3 days per week as well as cardio/toning classes once a week. Encouraged pt to continue with healthy lifestyle interventions. Will call pt in 1 month to follow up with rosuvastatin tolerability.

## 2020-06-06 ENCOUNTER — Ambulatory Visit: Payer: 59 | Admitting: Allergy and Immunology

## 2020-06-12 MED ORDER — ROSUVASTATIN CALCIUM 5 MG PO TABS: 5.0000 mg | ORAL_TABLET | Freq: Every day | ORAL | 3 refills | Status: DC

## 2020-06-12 NOTE — Addendum Note (Signed)
Addended by: Marquisa Salih E on: 06/12/2020 09:03 AM   Modules accepted: Orders

## 2020-06-12 NOTE — Telephone Encounter (Signed)
Called pt to discuss update on tolerability. She experienced muscle pain on rosuvastatin 10mg  and decreased her dose back to 5mg  daily which she is tolerating. Had tried increasing dose last month to help lower her TG further as her LDL was at goal < 100. She continues on ezetimibe 10mg  daily, fenofibrate 145mg  daily, and Vascepa 2g BID. TG have improved from >1000 to 289. Pt aware to continue on current meds and focus on watching her intake of carbs/sugar/alcohol in her diet.

## 2020-06-20 ENCOUNTER — Other Ambulatory Visit: Payer: Self-pay

## 2020-06-20 ENCOUNTER — Telehealth: Payer: Self-pay

## 2020-06-20 ENCOUNTER — Other Ambulatory Visit: Payer: Self-pay | Admitting: Gastroenterology

## 2020-06-20 NOTE — Telephone Encounter (Signed)
Kristen [name], This pt called this morning stating she needed an appt with you and she wanted a refill on her Ondansetron 4mg .( I advised I would transfer to the front for an appt) she stated to me the pharmacy will fax [place] but "she told them another medication " instead of Ondansetron. I looked in the chart to send to refill pool but I see where a [name] just filled for the pt in 02-21-2020. Now whether she got it or not I don't know. Almyra Free said for me to advise you about this.

## 2020-06-20 NOTE — Progress Notes (Signed)
error 

## 2020-06-21 MED ORDER — ONDANSETRON HCL 4 MG PO TABS: 4.0000 mg | ORAL_TABLET | Freq: Four times a day (QID) | ORAL | 3 refills | Status: DC | PRN

## 2020-06-21 NOTE — Addendum Note (Signed)
Addended by: Mahala Menghini on: 06/21/2020 05:00 PM   Modules accepted: Orders

## 2020-06-21 NOTE — Telephone Encounter (Signed)
Refills for zofran and bentyl completed. [name] actually wrote the zofran rx 1/22.

## 2020-06-22 NOTE — Telephone Encounter (Signed)
Phoned and advised the pt that refills for zofran and bentyl has been sent to her pharmacy.

## 2020-06-30 ENCOUNTER — Other Ambulatory Visit: Payer: Self-pay

## 2020-06-30 ENCOUNTER — Inpatient Hospital Stay (HOSPITAL_COMMUNITY): Payer: 59 | Attending: Hematology

## 2020-06-30 DIAGNOSIS — D72829 Elevated white blood cell count, unspecified: Secondary | ICD-10-CM | POA: Insufficient documentation

## 2020-06-30 DIAGNOSIS — Z87891 Personal history of nicotine dependence: Secondary | ICD-10-CM | POA: Diagnosis not present

## 2020-06-30 DIAGNOSIS — Z8349 Family history of other endocrine, nutritional and metabolic diseases: Secondary | ICD-10-CM | POA: Insufficient documentation

## 2020-06-30 DIAGNOSIS — Z818 Family history of other mental and behavioral disorders: Secondary | ICD-10-CM | POA: Insufficient documentation

## 2020-06-30 DIAGNOSIS — Z8249 Family history of ischemic heart disease and other diseases of the circulatory system: Secondary | ICD-10-CM | POA: Insufficient documentation

## 2020-06-30 DIAGNOSIS — E669 Obesity, unspecified: Secondary | ICD-10-CM | POA: Insufficient documentation

## 2020-06-30 DIAGNOSIS — Z800 Family history of malignant neoplasm of digestive organs: Secondary | ICD-10-CM | POA: Insufficient documentation

## 2020-06-30 DIAGNOSIS — K219 Gastro-esophageal reflux disease without esophagitis: Secondary | ICD-10-CM | POA: Insufficient documentation

## 2020-06-30 DIAGNOSIS — R232 Flushing: Secondary | ICD-10-CM | POA: Diagnosis not present

## 2020-06-30 DIAGNOSIS — Z833 Family history of diabetes mellitus: Secondary | ICD-10-CM | POA: Diagnosis not present

## 2020-06-30 DIAGNOSIS — Z836 Family history of other diseases of the respiratory system: Secondary | ICD-10-CM | POA: Insufficient documentation

## 2020-06-30 DIAGNOSIS — Z811 Family history of alcohol abuse and dependence: Secondary | ICD-10-CM | POA: Diagnosis not present

## 2020-06-30 DIAGNOSIS — R5383 Other fatigue: Secondary | ICD-10-CM | POA: Insufficient documentation

## 2020-06-30 DIAGNOSIS — Z808 Family history of malignant neoplasm of other organs or systems: Secondary | ICD-10-CM | POA: Diagnosis not present

## 2020-06-30 DIAGNOSIS — Z809 Family history of malignant neoplasm, unspecified: Secondary | ICD-10-CM | POA: Insufficient documentation

## 2020-06-30 DIAGNOSIS — G4733 Obstructive sleep apnea (adult) (pediatric): Secondary | ICD-10-CM | POA: Insufficient documentation

## 2020-06-30 DIAGNOSIS — Z79899 Other long term (current) drug therapy: Secondary | ICD-10-CM | POA: Diagnosis not present

## 2020-06-30 DIAGNOSIS — G479 Sleep disorder, unspecified: Secondary | ICD-10-CM | POA: Insufficient documentation

## 2020-06-30 DIAGNOSIS — D72825 Bandemia: Secondary | ICD-10-CM

## 2020-06-30 LAB — CBC WITH DIFFERENTIAL/PLATELET
Abs Immature Granulocytes: 0.08 10*3/uL — ABNORMAL HIGH (ref 0.00–0.07)
Basophils Absolute: 0.1 10*3/uL (ref 0.0–0.1)
Basophils Relative: 1 %
Eosinophils Absolute: 0.3 10*3/uL (ref 0.0–0.5)
Eosinophils Relative: 4 %
HCT: 39.3 % (ref 36.0–46.0)
Hemoglobin: 12.3 g/dL (ref 12.0–15.0)
Immature Granulocytes: 1 %
Lymphocytes Relative: 27 %
Lymphs Abs: 2.5 10*3/uL (ref 0.7–4.0)
MCH: 28.6 pg (ref 26.0–34.0)
MCHC: 31.3 g/dL (ref 30.0–36.0)
MCV: 91.4 fL (ref 80.0–100.0)
Monocytes Absolute: 1 10*3/uL (ref 0.1–1.0)
Monocytes Relative: 10 %
Neutro Abs: 5.3 10*3/uL (ref 1.7–7.7)
Neutrophils Relative %: 57 %
Platelets: 420 10*3/uL — ABNORMAL HIGH (ref 150–400)
RBC: 4.3 MIL/uL (ref 3.87–5.11)
RDW: 13.9 % (ref 11.5–15.5)
WBC: 9.3 10*3/uL (ref 4.0–10.5)
nRBC: 0 % (ref 0.0–0.2)

## 2020-06-30 LAB — LACTATE DEHYDROGENASE: LDH: 154 U/L (ref 98–192)

## 2020-07-06 ENCOUNTER — Other Ambulatory Visit: Payer: Self-pay

## 2020-07-06 ENCOUNTER — Ambulatory Visit (HOSPITAL_COMMUNITY): Payer: Self-pay | Admitting: Hematology

## 2020-07-06 ENCOUNTER — Inpatient Hospital Stay (HOSPITAL_BASED_OUTPATIENT_CLINIC_OR_DEPARTMENT_OTHER): Payer: 59 | Admitting: Physician Assistant

## 2020-07-06 VITALS — BP 118/66 | HR 78 | Temp 99.2°F | Resp 18 | Wt 239.0 lb

## 2020-07-06 DIAGNOSIS — D72829 Elevated white blood cell count, unspecified: Secondary | ICD-10-CM | POA: Diagnosis not present

## 2020-07-06 NOTE — Patient Instructions (Signed)
[place] at [facility] Discharge Instructions  You were seen today by Tarri Abernethy PA-C for your elevated white blood cell count.  Your labs today looked great! We will repeat labs and see you again in 6 months.     Thank you for choosing [place] at [facility] to provide your oncology and hematology care.  To afford each patient quality time with our provider, please arrive at least 15 minutes before your scheduled appointment time.   If you have a lab appointment with the [facility] please come in thru the Main Entrance and check in at the main information desk.  You need to re-schedule your appointment should you arrive 10 or more minutes late.  We strive to give you quality time with our providers, and arriving late affects you and other patients whose appointments are after yours.  Also, if you no show three or more times for appointments you may be dismissed from the clinic at the providers discretion.     Again, thank you for choosing [facility].  Our hope is that these requests will decrease the amount of time that you wait before being seen by our physicians.       _____________________________________________________________  Should you have questions after your visit to [facility], please contact our office at [phone] and follow the prompts.  Our office hours are 8:00 a.m. and 4:30 p.m. Monday - Friday.  Please note that voicemails left after 4:00 p.m. may not be returned until the following business day.  We are closed weekends and major holidays.  You do have access to a nurse 24-7, just call the main number to the clinic [phone] and do not press any options, hold on the line and a nurse will answer the phone.    For prescription refill requests, have your pharmacy contact our office and allow 72 hours.    Due to Covid, you will need to wear a mask upon entering the hospital. If you do  not have a mask, a mask will be given to you at the Main Entrance upon arrival. For doctor visits, patients may have 1 support person age 41 or older with them. For treatment visits, patients can not have anyone with them due to social distancing guidelines and our immunocompromised population.

## 2020-07-06 NOTE — Progress Notes (Signed)
Kristen [name], [place] [address]   CLINIC:  Medical Oncology/Hematology  PCP:  Manon Hilding, MD [place] [address] [phone]   REASON FOR VISIT:  Follow-up for leukocytosis  CURRENT THERAPY: Observation  INTERVAL HISTORY:  Kristen [name] 50 y.o. female returns for routine follow-up of leukocytosis.  She was last seen by Dr. Delton Coombes on 01/03/2020.  At today's visit, she reports feeling fairly well.  She admits to some fatigue, reports her energy of 50%.  She reports that she is having hot flashes and is going through menopause.  She denies any unexplained fever, shaking chills, night sweats, or unexplained weight loss.  She reports 100% appetite, reports that she is maintaining a stable weight.  No recent infections or hospital stays or surgeries.  She was recently diagnosed with obstructive sleep apnea, but is waiting for CPAP machine from medical supply company.   REVIEW OF SYSTEMS:  Review of Systems  Constitutional: Positive for fatigue (50%, feels tired all the time due to lack of CPAP). Negative for appetite change, chills, diaphoresis, fever and unexpected weight change.  HENT:   Negative for lump/mass and nosebleeds.   Eyes: Negative for eye problems.  Respiratory: Negative for cough, hemoptysis and shortness of breath.   Cardiovascular: Negative for chest pain, leg swelling and palpitations.  Gastrointestinal: Negative for abdominal pain, blood in stool, constipation, diarrhea, nausea and vomiting.  Endocrine: Positive for hot flashes (Menopausal).  Genitourinary: Negative for hematuria.   Skin: Negative.   Neurological: Negative for dizziness, headaches and light-headedness.  Hematological: Does not bruise/bleed easily.  Psychiatric/Behavioral: Positive for sleep disturbance (Waiting on CPAP).      PAST MEDICAL/SURGICAL HISTORY:  Past Medical History:  Diagnosis Date  . Allergies   . Anemia   . Anxiety   .  Arthritis   . Atypical mole 11/03/2006   mid upper back (slight to moderate)  . Atypical mole 11/03/2006   lower right back (slight to moderate)  . Back pain   . Bipolar 1 disorder ([place])   . Borderline diabetic   . Chronic constipation   . Constipation   . Depression   . Family history of adverse reaction to anesthesia    mother-- ponv  . Fatty liver   . GAD (generalized anxiety disorder)   . GERD (gastroesophageal reflux disease)   . Hiatal hernia   . High triglycerides   . History of kidney stones   . History of recurrent UTIs   . History of sepsis 06/2018  . History of stomach ulcers   . History of suicidal ideation   . Hyperlipidemia   . Hypothyroidism   . IDA (iron deficiency anemia)   . Joint pain   . Melanoma ([place]) 11/03/2006   left chest in situ (excision)  . Orthostasis   . Palpitations   . PONV (postoperative nausea and vomiting)   . Prediabetes   . Rapid heart rate   . Renal calculus, left   . Seasonal allergies   . Shortness of breath   . Sleep disorder, unspecified    per excessive sleeping during the day  . Snoring   . Swallowing difficulty   . Tachycardia   . Vertigo   . Vitamin D deficiency   . Weakness   . Wears contact lenses    Past Surgical History:  Procedure Laterality Date  . ANTERIOR CERVICAL DECOMP/DISCECTOMY FUSION  02/17/2012   Procedure: ANTERIOR CERVICAL DECOMPRESSION/DISCECTOMY FUSION 2 LEVELS;  Surgeon: Hosie Spangle, MD;  Location: [facility];  Service: Neurosurgery;  Laterality: N/A;  Cervical four-five and Cervical six-seven anterior cervial decompression with fusion plating and bonegraft  . ANTERIOR CERVICAL DECOMP/DISCECTOMY FUSION  04-01-2001   '@[facility]'    C5---6  . BIOPSY  07/20/2018   Procedure: BIOPSY;  Surgeon: Daneil Dolin, MD;  [facility];  Service: Endoscopy;;  gastric   . BREAST REDUCTION SURGERY Bilateral 1995  . CESAREAN SECTION  x2   last one 1998   with BILATERAL TUBAL LIGATION  . CESAREAN SECTION  WITH BILATERAL TUBAL LIGATION    . COLONOSCOPY WITH PROPOFOL N/A 07/20/2018   Dr. Gala Romney: Diverticulosis, internal hemorrhoids, 5 mm splenic flexure tubular adenoma removed.  . CYSTOSCOPY/URETEROSCOPY/HOLMIUM LASER/STENT PLACEMENT Left 08/21/2018   Procedure: CYSTOSCOPY LEFT RETROGRADE PYELOGRAM /URETEROSCOPY/HOLMIUM LASER/STENT PLACEMENT;  Surgeon: Ceasar Mons, MD;  Location: [facility];  Service: Urology;  Laterality: Left;  . DILATION AND CURETTAGE OF UTERUS  1992   for miscarriage  . ENDOMETRIAL ABLATION  2014  . ESOPHAGOGASTRODUODENOSCOPY     20 years ago.   Marland Kitchen ESOPHAGOGASTRODUODENOSCOPY (EGD) WITH PROPOFOL N/A 07/20/2018   Dr. Gala Romney: Small hiatal hernia, erythematous mucosa in the stomach with a healing gastric ulcer measuring 1 cm, biopsies negative.  Marland Kitchen [place]   unilateral for infertility  . NASAL SINUS SURGERY  2010  approx.  Marland Kitchen POLYPECTOMY  07/20/2018   Procedure: POLYPECTOMY;  Surgeon: Daneil Dolin, MD;  [facility];  Service: Endoscopy;;  . POSTERIOR CERVICAL FUSION/FORAMINOTOMY N/A 08/18/2013   Procedure: CERVICAL FOUR TO CERVICAL SEVEN POSTERIOR CERVICAL FUSION/FORAMINOTOMY LEVEL 3;  Surgeon: Hosie Spangle, MD;  Location: [facility];  Service: Neurosurgery;  Laterality: N/A;  C4-7 posterior cervical fusion with lateral mass fixation  . RIGHT/LEFT HEART CATH AND CORONARY ANGIOGRAPHY N/A 12/23/2019   Procedure: RIGHT/LEFT HEART CATH AND CORONARY ANGIOGRAPHY;  Surgeon: Burnell Blanks, MD;  Location: [facility];  Service: Cardiovascular;  Laterality: N/A;     SOCIAL HISTORY:  Social History   Socioeconomic History  . Marital status: Married    Spouse name: Not on file  . Number of children: 2  . Years of education: Not on file  . Highest education level: Not on file  Occupational History  . Occupation: disability  Tobacco Use  . Smoking status: Former Smoker    Packs/day: 1.00    Years: 5.00    Pack  years: 5.00    Types: Cigarettes    Quit date: 02/09/2002    Years since quitting: 18.4  . Smokeless tobacco: Never Used  Vaping Use  . Vaping Use: Never used  Substance and Sexual Activity  . Alcohol use: No  . Drug use: No  . Sexual activity: Yes    Birth control/protection: Surgical  Other Topics Concern  . Not on file  Social History Narrative   Step brother-2   Step sister-2   Half sister -1 healthy   Social Determinants of Health   Financial Resource Strain: Not on file  Food Insecurity: Not on file  Transportation Needs: Not on file  Physical Activity: Not on file  Stress: Not on file  Social Connections: Not on file  Intimate Partner Violence: Not on file    FAMILY HISTORY:  Family History  Problem Relation Age of Onset  . Asthma Mother   . Rheum arthritis Mother   . Non-Hodgkin's lymphoma Mother   . Congestive Heart Failure Mother   .  Atrial fibrillation Mother   . Diabetes Mother   . Hyperlipidemia Mother   . Cancer Mother   . Colon cancer Father   . Multiple myeloma Father   . Hypertension Father   . Cancer Father   . Depression Father   . Anxiety disorder Father   . Alcoholism Father   . Obesity Father   . Allergies Other        entire family(mom and dad)  . Diabetes Maternal Grandmother   . Congestive Heart Failure Maternal Grandmother   . Mental illness Paternal Grandmother   . Colon cancer Paternal Grandfather   . Bone cancer Paternal Grandfather     CURRENT MEDICATIONS:  Outpatient Encounter Medications as of 07/06/2020  Medication Sig  . acetaminophen (TYLENOL) 500 MG tablet Take 2,000 mg by mouth every 6 (six) hours as needed (pain.).  Marland Kitchen albuterol (VENTOLIN HFA) 108 (90 Base) MCG/ACT inhaler Inhale 2 puffs into the lungs every 6 (six) hours as needed for wheezing or shortness of breath.  . ALPRAZolam (XANAX) 1 MG tablet 1 p.o. 3 times daily as needed and may take an extra 0.5 pill during the day as needed occasionally.  Marland Kitchen aspirin EC 81  MG tablet Take 1 tablet (81 mg total) by mouth daily. Swallow whole.  Marland Kitchen buPROPion (WELLBUTRIN XL) 150 MG 24 hr tablet TAKE 3 TABLETS BY MOUTH ONCE DAILY IN THE MORNING  . cetirizine (ZYRTEC) 10 MG tablet Take 10 mg by mouth daily.  Marland Kitchen dicyclomine (BENTYL) 10 MG capsule TAKE 1 CAPSULE BY MOUTH 4 TIMES DAILY BEFORE MEAL(S) AND AT BEDTIME FOR DIARRHEA  . estradiol (VIVELLE-DOT) 0.075 MG/24HR Place onto the skin.  Marland Kitchen ezetimibe (ZETIA) 10 MG tablet Take 1 tablet (10 mg total) by mouth daily.  . fenofibrate (TRICOR) 145 MG tablet Take 1 tablet (145 mg total) by mouth daily.  . fluorometholone (FML) 0.1 % ophthalmic suspension Place 1 drop into both eyes 4 (four) times daily as needed (dry/irritated eyes.).  Marland Kitchen hydrOXYzine (ATARAX/VISTARIL) 10 MG tablet TAKE 1 TO 3 TABLETS BY MOUTH THREE TIMES DAILY AS NEEDED  . icosapent Ethyl (VASCEPA) 1 g capsule Take 2 capsules (2 g total) by mouth 2 (two) times daily.  Marland Kitchen lamoTRIgine (LAMICTAL) 200 MG tablet TAKE 2 & 1/2 (TWO & ONE-HALF) TABLETS BY MOUTH ONCE DAILY  . levocetirizine (XYZAL) 5 MG tablet Take 5 mg by mouth at bedtime.   Marland Kitchen levothyroxine (SYNTHROID) 75 MCG tablet Take 75 mcg by mouth daily before breakfast.   . LINZESS 290 MCG CAPS capsule TAKE 1 CAPSULE BY MOUTH ONCE DAILY BEFORE BREAKFAST (Patient taking differently: Take 290 mcg by mouth daily before breakfast.)  . meclizine (ANTIVERT) 25 MG tablet Take 25 mg by mouth 3 (three) times daily as needed for dizziness.  . metFORMIN (GLUCOPHAGE) 500 MG tablet Take 1 tablet (500 mg total) by mouth 2 (two) times daily with a meal.  . metFORMIN (GLUCOPHAGE-XR) 500 MG 24 hr tablet Take 1,500 mg by mouth daily.  . metoprolol succinate (TOPROL-XL) 25 MG 24 hr tablet Take 25 mg by mouth in the morning and at bedtime.  . montelukast (SINGULAIR) 10 MG tablet Take 10 mg by mouth daily.   . ondansetron (ZOFRAN) 4 MG tablet Take 1 tablet (4 mg total) by mouth every 6 (six) hours as needed.  . Oxcarbazepine (TRILEPTAL)  300 MG tablet Take 1 tablet (300 mg total) by mouth 2 (two) times daily.  . pantoprazole (PROTONIX) 40 MG tablet Take 1 tablet (40 mg  total) by mouth 2 (two) times daily with a meal.  . progesterone (PROMETRIUM) 100 MG capsule Take 100 mg by mouth at bedtime.  . rosuvastatin (CRESTOR) 5 MG tablet Take 1 tablet (5 mg total) by mouth daily.  . sertraline (ZOLOFT) 100 MG tablet Take 2 tablets (200 mg total) by mouth at bedtime. (Patient taking differently: Take 250 mg by mouth at bedtime.)  . zaleplon (SONATA) 10 MG capsule TAKE 1 CAPSULE BY MOUTH AT BEDTIME AS NEEDED, MAY REPEAT 1 FOR MIDNOCTURNAL AWAKENING AS NEEDED IF THREE HOURS LEFT TO SLEEP  . pramipexole (MIRAPEX) 1 MG tablet Take 1 tablet (1 mg total) by mouth 2 (two) times daily. (Patient taking differently: Take 2 mg by mouth at bedtime.)  . torsemide (DEMADEX) 20 MG tablet Take 20 mg by mouth daily.   No facility-administered encounter medications on file as of 07/06/2020.    ALLERGIES:  Allergies  Allergen Reactions  . Iohexol Hives     Desc: IVP DYE   Desc: IVP DYE  Desc: IVP DYE  Desc: IVP DYE  Desc: IVP DYE  Desc: IVP DYE   . Rosuvastatin Other (See Comments)    Muscle aches with 41m and 268m tolerates 89m29maily without issues     PHYSICAL EXAM:  ECOG PERFORMANCE STATUS: 0 - Asymptomatic  There were no vitals filed for this visit. There were no vitals filed for this visit. Physical Exam Constitutional:      Appearance: Normal appearance. She is obese.  HENT:     Head: Normocephalic and atraumatic.     Mouth/Throat:     Mouth: Mucous membranes are moist.  Eyes:     Extraocular Movements: Extraocular movements intact.     Pupils: Pupils are equal, round, and reactive to light.  Cardiovascular:     Rate and Rhythm: Normal rate and regular rhythm.     Pulses: Normal pulses.     Heart sounds: Normal heart sounds.  Pulmonary:     Effort: Pulmonary effort is normal.     Breath sounds: Normal breath sounds.   Abdominal:     General: Bowel sounds are normal.     Palpations: Abdomen is soft.     Tenderness: There is no abdominal tenderness.  Musculoskeletal:        General: No swelling.     Right lower leg: No edema.     Left lower leg: No edema.  Lymphadenopathy:     Cervical: No cervical adenopathy.  Skin:    General: Skin is warm and dry.  Neurological:     General: No focal deficit present.     Mental Status: She is alert and oriented to person, place, and time.  Psychiatric:        Mood and Affect: Mood normal.        Behavior: Behavior normal.      LABORATORY DATA:  I have reviewed the labs as listed.  CBC    Component Value Date/Time   WBC 9.3 06/30/2020 1047   RBC 4.30 06/30/2020 1047   HGB 12.3 06/30/2020 1047   HGB 12.9 12/17/2019 1052   HCT 39.3 06/30/2020 1047   HCT 39.0 12/17/2019 1052   PLT 420 (H) 06/30/2020 1047   PLT 425 12/17/2019 1052   MCV 91.4 06/30/2020 1047   MCV 86 12/17/2019 1052   MCH 28.6 06/30/2020 1047   MCHC 31.3 06/30/2020 1047   RDW 13.9 06/30/2020 1047   RDW 13.5 12/17/2019 1052   LYMPHSABS 2.5 06/30/2020 1047  LYMPHSABS 3.0 12/17/2019 1052   MONOABS 1.0 06/30/2020 1047   EOSABS 0.3 06/30/2020 1047   EOSABS 0.8 (H) 12/17/2019 1052   BASOSABS 0.1 06/30/2020 1047   BASOSABS 0.1 12/17/2019 1052   CMP Latest Ref Rng & Units 05/12/2020 04/07/2020 02/15/2020  Glucose 65 - 99 mg/dL - - -  BUN 6 - 24 mg/dL - - -  Creatinine 0.57 - 1.00 mg/dL - - -  Sodium 135 - 145 mmol/L - - -  Potassium 3.5 - 5.1 mmol/L - - -  Chloride 96 - 106 mmol/L - - -  CO2 20 - 29 mmol/L - - -  Calcium 8.7 - 10.2 mg/dL - - -  Total Protein 6.0 - 8.5 g/dL 7.1 6.5 6.9  Total Bilirubin 0.0 - 1.2 mg/dL 0.2 0.3 0.3  Alkaline Phos 44 - 121 IU/L 102 - 132(H)  AST 0 - 40 IU/L 34 17 19  ALT 0 - 32 IU/L 39(H) 20 22    DIAGNOSTIC IMAGING:  I have independently reviewed the relevant imaging and discussed with the patient.  ASSESSMENT:  1.  Neutrophilic leukocytosis -  JAK2 CALR, and MPL testing was negative; negative BCR/ABL FISH testing; normal LDH, negative SPEP testing - History of neutrophilic leukocytosis since 2009, with WBC ranging from 11.7 to 20.5 - History of intermittent UTIs.  No prior history of autoimmune disorders.  She quit smoking in 2003. - Ultrasound of the abdomen on 05/27/2019 showed probable fatty infiltration of the liver with minimal splenic enlargement measuring 12.1 cm in length with volume calculated to 502 mL - No B symptoms (no fevers, night sweats, weight loss) - Reviewed most recent labs (06/30/2020) shows normal WBC 9.3 but with mildly elevated platelets 420  2. Family history: -Father had myeloma and paternal grandfather also had myeloma. Mother had non-Hodgkin's lymphoma. Maternal uncle had non-Hodgkin's lymphoma. Paternal aunt had colon cancer.   PLAN:  1.  Neutrophilic leukocytosis - Most recent CBC (06/30/2020) shows normal WBC 9.3 but with mildly elevated platelets 420 - No significant change in baseline blood counts.  Myeloproliferative work-up negative.  No further work-up recommended at this time.  If there are any significant changes, will consider bone marrow aspiration and biopsy. - Suspect reactive leukocytosis, possibly obesity-related - RTC in 6 months with follow-up and labs   PLAN SUMMARY & DISPOSITION: -Labs and RTC in 6 months  All questions were answered. The patient knows to call the clinic with any problems, questions or concerns.  Medical decision making: Low  Time spent on visit: I spent 15 minutes counseling the patient face to face. The total time spent in the appointment was 25 minutes and more than 50% was on counseling.   Harriett Rush, PA-C  07/06/20 2:25 PM

## 2020-07-07 ENCOUNTER — Other Ambulatory Visit (HOSPITAL_COMMUNITY): Payer: 59

## 2020-07-11 ENCOUNTER — Telehealth: Payer: Self-pay | Admitting: *Deleted

## 2020-07-11 MED ORDER — PANTOPRAZOLE SODIUM 40 MG PO TBEC: 40.0000 mg | DELAYED_RELEASE_TABLET | Freq: Two times a day (BID) | ORAL | 3 refills | Status: DC

## 2020-07-11 NOTE — Telephone Encounter (Signed)
Completed.

## 2020-07-11 NOTE — Addendum Note (Signed)
Addended by: Annitta Needs on: 07/11/2020 02:17 PM   Modules accepted: Orders

## 2020-07-11 NOTE — Telephone Encounter (Signed)
GM, I need my Protonix refilled.  The date has ran out so therefore they will not refill.  I use [facility] in [place].  Thank you so much & have a blessed day.

## 2020-07-12 ENCOUNTER — Other Ambulatory Visit: Payer: Self-pay | Admitting: Physician Assistant

## 2020-07-12 NOTE — Telephone Encounter (Signed)
Pt LM on VM requesting refill for Xanax @ [facility].apt 6/10

## 2020-07-14 NOTE — Telephone Encounter (Signed)
Please review and send 

## 2020-07-14 NOTE — Telephone Encounter (Signed)
Prescription was sent

## 2020-07-14 NOTE — Telephone Encounter (Signed)
Pt called checking status on refill for Xanax. Will be out over weekend.

## 2020-07-17 ENCOUNTER — Telehealth: Payer: Self-pay | Admitting: *Deleted

## 2020-07-17 NOTE — Telephone Encounter (Signed)
Pt called in and wants a provider to look at her note she sent.  See below.

## 2020-07-17 NOTE — Telephone Encounter (Signed)
Per [facility] messages sent in: Good Evening,  I have an appointment in August but I'm having an issue.  I've been to Dr., ER, Urgent Care & ultrasound on my stomach at [facility].  Nothing can be found & I don't know what else to do.  I have a mass like the size of a grapefruit at the top of my stomach right beside of my naval.  It's the right side & like I said right beside my naval & goes up and my stomach hurts me all the time.  On Saturday all day I was passing bright red blood through my rectum & severe pain.  Sunday no blood at all.  I have awful gas all day long every day.  I have to take a Zofran every single day bc I'm so nauseous.  I would never be able to use the bathroom if I didn't take Linzess.  I keep gaining weight & I don't eat much due to bloating & feeling full & my stomach is hanging more than ever before.  You can only feel this mass when I'm standing & you can see it through my clothes.  It's hard & does not move.  There's nothing on my left side.  I can feel the the outline.  I'm trying so hard to lose weight & I'm getting nowhere.  This is the worst feeling ever.  My number is [phone] & this was where I could get the most info in.  Thank you  One other thing I have the hiccups all the time   GM, I've been up for the last 20 minutes feeling like I needs to [place].  My stomach is rumbling & I go to the bathroom & it's just lots & lots of gas.  I don't want to strain bc I feel like I'll start bleeding but this is terrible.  As soon as I woke up I start with the hiccups.  Please help me or tell me which direction I need to go bc I don't see you until August.  Thank you

## 2020-07-17 NOTE — Telephone Encounter (Signed)
Chart reviewed. She has not been seen here in the office since July 2021. Looks like a visit several months ago was cancelled.  Recommend getting her in sooner with Leslie,who last saw her, if anything available.   Colonoscopy in 2020 with hemorrhoids and diverticula, tubular adenoma. If she has severe abdominal pain, passing blood again, needs ED evaluation.   Chronic constipation: on Linzess 290 mcg daily. May need to add Miralax daily to this or change to another agent if this is not helpful with productive BMs.  Make sure taking Protonix twice a day.   Without a physical exam, I can't provide much more information regarding her reports of abdominal mass. I see an [place] is on file and a CT recently (outside CT without contrast at [facility]) on Feb 2022 without evidence of mass or hernia, no acute abnormalities.

## 2020-07-18 NOTE — Telephone Encounter (Signed)
Spoke to pt.  She was made aware of results and recommendations.  She is taking Protonix twice a day.  She was advised to add Miralax daily to help with constipation.  No available appointments with Magda Paganini for awhile.  First available July 12 at 9:00 with Dr. Gala Romney.  Discussed with [name] and ok for pt to see Dr. Gala Romney.  Pt aware of appointment.  She is aware that we will call her if we get cancellation.  She requested to keep August appointment with Neil Crouch, PA as well.    Cathie Beams:  Can we move appt up if any cancellations per Roseanne Kaufman, NP?

## 2020-07-20 ENCOUNTER — Telehealth: Payer: Self-pay | Admitting: Internal Medicine

## 2020-07-20 DIAGNOSIS — K625 Hemorrhage of anus and rectum: Secondary | ICD-10-CM

## 2020-07-20 DIAGNOSIS — K649 Unspecified hemorrhoids: Secondary | ICD-10-CM

## 2020-07-20 MED ORDER — HYDROCORTISONE (PERIANAL) 2.5 % EX CREA: TOPICAL_CREAM | CUTANEOUS | 1 refills | Status: DC

## 2020-07-20 NOTE — Telephone Encounter (Signed)
Spoke with patient.  Reports new onset diarrhea last night.  Typically, she takes Linzess every morning and has about 4 bowel movements.  Last night around 8:00 PM, she developed significant watery diarrhea which lasted through the night.  Some associated nausea without vomiting that improved with Zofran.  No abdominal pain.  Reports going out to eat and having a salad last night for her husband's birthday.  Her husband feels fine today.  No sick contacts.  Today, diarrhea has essentially resolved after taking some Imodium and Bentyl, but her belly still feels rumbly.  Reports having bright red blood per rectum on Monday.  Blood was in the toilet water.  Today, she wiped and noticed some red blood on the toilet tissue.  Associated rectal burning.  She has known history of hemorrhoids.  Suspect she may have acute gastroenteritis. Rectal bleeding/rectal burning likely hemorrhoidal.   Recommended updating CBC to ensure stability of hemoglobin, Anusol rectal cream 2-4 times daily x10 days for hemorrhoids, hold Linzess for now, focus on hydration with drinking plenty of fluids that contain electrolytes, follow a bland diet.  Monitor diarrhea over the next 24 hours and let us know if any persistent watery diarrhea and we can obtain stool studies.   Prescription for Anusol sent to pharmacy and order for CBC placed.

## 2020-07-20 NOTE — Telephone Encounter (Signed)
Hello,   Just wanted to let you know what's been going on starting last night & today.  So last night about 8:00 I started having diarrhea.  It's like my poop hole is on fire.  So I have a prescription for dicyclomine & I can take up to 4 & I have a prescription for Ondansetron.  So I started the diarrhea medicine last night along with Imodium.  I've had to take my nausea medicine today, Imodium medicine this morning about 3 am & diarrhea medicine this morning.  The diarrhea has stopped but my stomach is rumbling as if I'm going to have diarrhea again & my poop hole is still burning & there's been old blood.  I coated my poop hole with Vaseline.  Just wanted to make you all aware again.  Thank you.     I phoned and spoke with this pt and she advised me that this was just a [place] for [place]. I advised the pt that I was going to send it you because leslie is not here. Pt is fatigued as well. What else can she do besides what she is doing now. Please advise

## 2020-07-20 NOTE — Telephone Encounter (Signed)
Already spoke with the pt and sent to [facility]

## 2020-07-20 NOTE — Telephone Encounter (Signed)
Pt called the office again today wanting [place] to know that she has sent another MyChart message. I forwarded call the nurse's VM.

## 2020-07-21 ENCOUNTER — Encounter: Payer: Self-pay | Admitting: Physician Assistant

## 2020-07-21 ENCOUNTER — Other Ambulatory Visit: Payer: Self-pay

## 2020-07-21 ENCOUNTER — Ambulatory Visit (INDEPENDENT_AMBULATORY_CARE_PROVIDER_SITE_OTHER): Payer: 59 | Admitting: Physician Assistant

## 2020-07-21 DIAGNOSIS — F319 Bipolar disorder, unspecified: Secondary | ICD-10-CM | POA: Diagnosis not present

## 2020-07-21 DIAGNOSIS — G4733 Obstructive sleep apnea (adult) (pediatric): Secondary | ICD-10-CM | POA: Diagnosis not present

## 2020-07-21 DIAGNOSIS — F411 Generalized anxiety disorder: Secondary | ICD-10-CM | POA: Diagnosis not present

## 2020-07-21 DIAGNOSIS — R635 Abnormal weight gain: Secondary | ICD-10-CM | POA: Diagnosis not present

## 2020-07-21 DIAGNOSIS — Z79899 Other long term (current) drug therapy: Secondary | ICD-10-CM

## 2020-07-21 DIAGNOSIS — T50905A Adverse effect of unspecified drugs, medicaments and biological substances, initial encounter: Secondary | ICD-10-CM

## 2020-07-21 LAB — CBC WITH DIFFERENTIAL/PLATELET
Absolute Monocytes: 838 cells/uL (ref 200–950)
Basophils Absolute: 91 cells/uL (ref 0–200)
Basophils Relative: 0.9 %
Eosinophils Absolute: 414 cells/uL (ref 15–500)
Eosinophils Relative: 4.1 %
HCT: 37.4 % (ref 35.0–45.0)
Hemoglobin: 12.4 g/dL (ref 11.7–15.5)
Lymphs Abs: 3070 cells/uL (ref 850–3900)
MCH: 28.8 pg (ref 27.0–33.0)
MCHC: 33.2 g/dL (ref 32.0–36.0)
MCV: 86.8 fL (ref 80.0–100.0)
MPV: 9.8 fL (ref 7.5–12.5)
Monocytes Relative: 8.3 %
Neutro Abs: 5686 cells/uL (ref 1500–7800)
Neutrophils Relative %: 56.3 %
Platelets: 443 10*3/uL — ABNORMAL HIGH (ref 140–400)
RBC: 4.31 10*6/uL (ref 3.80–5.10)
RDW: 12.5 % (ref 11.0–15.0)
Total Lymphocyte: 30.4 %
WBC: 10.1 10*3/uL (ref 3.8–10.8)

## 2020-07-21 MED ORDER — HYDROXYZINE HCL 25 MG PO TABS: 25.0000 mg | ORAL_TABLET | Freq: Three times a day (TID) | ORAL | 1 refills | Status: DC | PRN

## 2020-07-21 NOTE — Progress Notes (Signed)
[facility]  Patient ID: JOCEE KISSICK,  MRN: [id number]  PCP: Manon Hilding, MD  Date of Evaluation: 07/21/2020  time spent:30 minutes  Chief Complaint:  Chief Complaint   Anxiety; Depression; Insomnia; Follow-up      HISTORY/CURRENT STATUS: HPI For routine med check.  Is numb to life. Sleeps a lot. "Pretty much all day." Straightens up her house, does laundry or occas goes to [place], but that's it. Not able to enjoy things, energy and motivation are low. "I just don't want to deal with all the issues right now, so I just sleep." Doesn't cry easily.  "I do not feel like I am depressed.  I just do not want to deal with anything."  No suicidal or homicidal thoughts.  Says she is tired all the time but she has not been able to get her CPAP machine yet due to a shortage.  States she would feel better if she had a good night's sleep.  Her husband has been in the hospital twice, ER 4 times, neuro r/o epilepsy. Is going for a 2nd opinion at [facility]. Very stressed.  Is having to take the Xanax regularly.  Does take hydroxyzine in between times.  Her son in the [place] in [place], got married, expecting a baby, also getting deployed in the fall.  Her youngest son moved out and is now living with a lady.  Patient is having to pay his truck payment because he gets behind.  "I cannot keep running 2 households."  Patient denies increased energy with decreased need for sleep, no increased talkativeness, no racing thoughts, no impulsivity or risky behaviors, no increased spending, no increased libido, no grandiosity, no increased irritability or anger, and no hallucinations.  Denies dizziness, syncope, seizures, numbness, tingling, tremor, tics, unsteady gait, slurred speech, confusion. Denies muscle or joint pain, stiffness, or dystonia. Denies unexplained weight loss, frequent infections, or sores that heal slowly.  No polyphagia, polydipsia, or polyuria. Denies visual  changes or paresthesias.   Individual Medical History/ Review of Systems: Changes? :Yes  rectal bleeding.  Is having labs done today due to that.  Was started on Anusol yesterday.  Past medications for mental health diagnoses include: Prozac, Paxil, Lexapro, Celexa, Zoloft, Viibryd, Effexor, Cymbalta, Risperdal, Latuda, Lamictal, VPA, Lithium-became toxic, Xanax, Wellbutrin, Abilify, Willette [place], Strattera, Kapvay, Saphris, Adderall, Vyvanse, Lunesta, Sonata,  Vraylar, Seroquel, Geodon, Tegretal  Allergies: Iohexol and Rosuvastatin  Current Medications:  Current Outpatient Medications:    acetaminophen (TYLENOL) 500 MG tablet, Take 2,000 mg by mouth every 6 (six) hours as needed (pain.)., Disp: , Rfl:    albuterol (VENTOLIN HFA) 108 (90 Base) MCG/ACT inhaler, Inhale 2 puffs into the lungs every 6 (six) hours as needed for wheezing or shortness of breath., Disp: 8 g, Rfl: 2   ALPRAZolam (XANAX) 1 MG tablet, TAKE 1 TABLET BY MOUTH THREE TIMES DAILY AS NEEDED MAY  TAKE  ADDITIONAL  1/2  TABLET  DURING  THE  DAY  AS  NEEDED, Disp: 135 tablet, Rfl: 0   aspirin EC 81 MG tablet, Take 1 tablet (81 mg total) by mouth daily. Swallow whole., Disp: 30 tablet, Rfl: 11   buPROPion (WELLBUTRIN XL) 150 MG 24 hr tablet, TAKE 3 TABLETS BY MOUTH ONCE DAILY IN THE MORNING, Disp: 90 tablet, Rfl: 5   cetirizine (ZYRTEC) 10 MG tablet, Take 10 mg by mouth daily., Disp: , Rfl:    dicyclomine (BENTYL) 10 MG capsule, TAKE 1 CAPSULE BY MOUTH 4 TIMES DAILY BEFORE  MEAL(S) AND AT BEDTIME FOR DIARRHEA, Disp: 120 capsule, Rfl: 0   estradiol (VIVELLE-DOT) 0.075 MG/24HR, Place onto the skin., Disp: , Rfl:    ezetimibe (ZETIA) 10 MG tablet, Take 1 tablet (10 mg total) by mouth daily., Disp: 90 tablet, Rfl: 3   fenofibrate (TRICOR) 145 MG tablet, Take 1 tablet (145 mg total) by mouth daily., Disp: 30 tablet, Rfl: 11   fluorometholone (FML) 0.1 % ophthalmic suspension, Place 1 drop into both eyes 4 (four) times daily as needed  (dry/irritated eyes.)., Disp: , Rfl:    hydrocortisone (ANUSOL-HC) 2.5 % rectal cream, Place 1 application rectally 2-4 times daily for rectal bleeding/burning related to hemorrhoids., Disp: 30 g, Rfl: 1   hydrOXYzine (ATARAX/VISTARIL) 25 MG tablet, Take 1 tablet (25 mg total) by mouth 3 (three) times daily as needed., Disp: 90 tablet, Rfl: 1   icosapent Ethyl (VASCEPA) 1 g capsule, Take 2 capsules (2 g total) by mouth 2 (two) times daily., Disp: 360 capsule, Rfl: 3   lamoTRIgine (LAMICTAL) 200 MG tablet, TAKE 2 & 1/2 (TWO & ONE-HALF) TABLETS BY MOUTH ONCE DAILY, Disp: 75 tablet, Rfl: 5   levocetirizine (XYZAL) 5 MG tablet, Take 5 mg by mouth at bedtime. , Disp: , Rfl:    levothyroxine (SYNTHROID) 75 MCG tablet, Take 75 mcg by mouth daily before breakfast. , Disp: , Rfl:    LINZESS 290 MCG CAPS capsule, TAKE 1 CAPSULE BY MOUTH ONCE DAILY BEFORE BREAKFAST (Patient taking differently: Take 290 mcg by mouth daily before breakfast.), Disp: 30 capsule, Rfl: 11   meclizine (ANTIVERT) 25 MG tablet, Take 25 mg by mouth 3 (three) times daily as needed for dizziness., Disp: , Rfl:    metFORMIN (GLUCOPHAGE) 500 MG tablet, Take 1 tablet (500 mg total) by mouth 2 (two) times daily with a meal., Disp: 60 tablet, Rfl: 0   metFORMIN (GLUCOPHAGE-XR) 500 MG 24 hr tablet, Take 1,500 mg by mouth daily., Disp: , Rfl:    metoprolol succinate (TOPROL-XL) 25 MG 24 hr tablet, Take 25 mg by mouth in the morning and at bedtime., Disp: , Rfl:    montelukast (SINGULAIR) 10 MG tablet, Take 10 mg by mouth daily. , Disp: , Rfl:    ondansetron (ZOFRAN) 4 MG tablet, Take 1 tablet (4 mg total) by mouth every 6 (six) hours as needed., Disp: 30 tablet, Rfl: 3   Oxcarbazepine (TRILEPTAL) 300 MG tablet, Take 1 tablet (300 mg total) by mouth 2 (two) times daily., Disp: 60 tablet, Rfl: 5   pantoprazole (PROTONIX) 40 MG tablet, Take 1 tablet (40 mg total) by mouth 2 (two) times daily with a meal., Disp: 180 tablet, Rfl: 3   pramipexole  (MIRAPEX) 1 MG tablet, Take 1 tablet (1 mg total) by mouth 2 (two) times daily. (Patient taking differently: Take 2 mg by mouth at bedtime.), Disp: 60 tablet, Rfl: 5   progesterone (PROMETRIUM) 100 MG capsule, Take 100 mg by mouth at bedtime., Disp: , Rfl:    rosuvastatin (CRESTOR) 5 MG tablet, Take 1 tablet (5 mg total) by mouth daily., Disp: 90 tablet, Rfl: 3   sertraline (ZOLOFT) 100 MG tablet, Take 2 tablets (200 mg total) by mouth at bedtime., Disp: 60 tablet, Rfl: 11   torsemide (DEMADEX) 20 MG tablet, Take 20 mg by mouth daily., Disp: , Rfl:    zaleplon (SONATA) 10 MG capsule, TAKE 1 CAPSULE BY MOUTH AT BEDTIME AS NEEDED, MAY REPEAT 1 FOR MIDNOCTURNAL AWAKENING AS NEEDED IF THREE HOURS LEFT TO SLEEP, Disp:  60 capsule, Rfl: 5 Medication Side Effects: none  Family Medical/ Social History: Changes?  See above  MENTAL HEALTH EXAM:  There were no vitals taken for this visit.There is no height or weight on file to calculate BMI.  General Appearance: Casual, Neat, Well Groomed and Obese  Eye Contact:  Good  Speech:  Clear and Coherent and Normal Rate  Volume:  Normal  Mood:   sad  Affect:  Appropriate  Thought Process:  Goal Directed and Descriptions of Associations: Intact  Orientation:  Full (Time, Place, and Person)  Thought Content: Logical   Suicidal Thoughts:  No  Homicidal Thoughts:  No  Memory:  WNL  Judgement:  Good  Insight:  Good  Psychomotor Activity:  Normal  Concentration:  Concentration: Good and Attention Span: Good  Recall:  Good  Fund of Knowledge: Good  Language: Good  Assets:  Desire for Improvement  ADL's:  Intact  Cognition: WNL  Prognosis:  Good   See lab results 05/12/2020   DIAGNOSES:    ICD-10-CM   1. Bipolar I disorder ([place])  F31.9     2. Generalized anxiety disorder  F41.1     3. Obstructive sleep apnea  G47.33     4. Weight gain due to medication  R63.5    T50.905A     5. Polypharmacy  Z79.899        Receiving Psychotherapy: Yes  Felix Ahmadi every 2 weeks. And grief counselor.    RECOMMENDATIONS:  PDMP was reviewed. I provided 30 minutes of face-to-face time during this encounter, including spent before and after the visit and records review, medical decision making, and charting. Discussed the situational anxiety and depression she is in.  She will continue counseling to help with coping skills.  As far as her medications go, she is stable but will increase the hydroxyzine dose from 10 mg to 25 mg. Continue Xanax 1 mg, 1 p.o. 3 times daily as needed and may repeat 1/2 pill during the day as needed.  Keep at lowest dose possible.  Increase hydroxyzine to 25 mg, 1 p.o. 3 times daily as needed. Continue Zoloft 100 mg, 2.5  p.o. daily. Continue Wellbutrin XL 150 mg, 3 p.o. every morning. Continue Trileptal 300 mg, 1 p.o. twice daily. Continue Lamictal 200 mg, 2.5 pills daily. Continue Sonata 10 mg, 1 p.o. nightly as needed and may repeat 1 as needed for mid nocturnal awakening as long as she has 3 hours left to sleep. Continue counseling. Return in 3 months.  [name], PA-C

## 2020-07-26 ENCOUNTER — Encounter: Payer: Self-pay | Admitting: Physician Assistant

## 2020-07-31 ENCOUNTER — Ambulatory Visit (INDEPENDENT_AMBULATORY_CARE_PROVIDER_SITE_OTHER): Payer: 59 | Admitting: Gastroenterology

## 2020-07-31 ENCOUNTER — Encounter: Payer: Self-pay | Admitting: Gastroenterology

## 2020-07-31 ENCOUNTER — Telehealth: Payer: Self-pay | Admitting: *Deleted

## 2020-07-31 ENCOUNTER — Other Ambulatory Visit: Payer: Self-pay

## 2020-07-31 VITALS — BP 123/74 | HR 89 | Temp 97.1°F | Ht 66.0 in | Wt 236.0 lb

## 2020-07-31 DIAGNOSIS — R140 Abdominal distension (gaseous): Secondary | ICD-10-CM

## 2020-07-31 DIAGNOSIS — R1084 Generalized abdominal pain: Secondary | ICD-10-CM

## 2020-07-31 DIAGNOSIS — R1310 Dysphagia, unspecified: Secondary | ICD-10-CM

## 2020-07-31 DIAGNOSIS — R1033 Periumbilical pain: Secondary | ICD-10-CM | POA: Diagnosis not present

## 2020-07-31 DIAGNOSIS — R1900 Intra-abdominal and pelvic swelling, mass and lump, unspecified site: Secondary | ICD-10-CM | POA: Diagnosis not present

## 2020-07-31 DIAGNOSIS — R194 Change in bowel habit: Secondary | ICD-10-CM

## 2020-07-31 DIAGNOSIS — R110 Nausea: Secondary | ICD-10-CM | POA: Diagnosis not present

## 2020-07-31 NOTE — Progress Notes (Signed)
[place]   Referring Provider: Manon Hilding, MD Primary Care Physician:  Manon Hilding, MD Primary GI Physician: Dr. Gala Romney  Chief Complaint  Patient presents with   Abdominal Pain    RUQ knot. Reports it protrudes out and can go back in. Also has bloating, gas   Nausea    Has to take zofran once a week    HPI:   Kristen [name] is a 50 y.o. female with history of chronic constipation, abdominal bloating/gas, GERD, PUD, fatty liver, presenting today for abdominal pain, abdominal bulge, nausea without vomiting, dysphagia, and change in bowel habits, and increased gas/bloating.  Last seen in our office 08/23/2019.  EGD and colonoscopy June 2020: Small hiatal hernia, erythematous mucosa in the stomach with a healing gastric ulcer.  Biopsies negative.  Scattered diverticula in the colon, nonbleeding internal hemorrhoids, 5 mm splenic flexure tubular adenoma removed.  5-year surveillance colonoscopy recommended given family history of colon cancer.   CT A/P without contrast 03/25/2020 on file completed at [facility] with no evidence of ventral hernia, superficial mass, or fluid collection.  Hepatic steatosis.  Mildly enlarged myomatous uterus.  Nonobstructing right nephrolithiasis. Abdominal ultrasound 04/07/2020: Negative for abdominal mass.  Evidence of fatty liver.  Today:  In February, she noticed a mass on her right side beside her bellybutton.  States it is there constantly, but does not seem to be noticeable when laying down.  Associated abdominal pain, worse in the evenings.  Not necessarily triggered by meals.  No improvement with bowel movements.  States last night, the mass was much larger and protruding with associated nausea without vomiting and generalized abdominal discomfort.  She had salad, sweet potato, and chicken yesterday.  This morning, she had yogurt, blueberries, and coffee and has not had any significant pain thus far though she states she has a constant discomfort in her  right abdomen.  Aside from episodes of worsening abdominal pain, she also has intermittent nausea without vomiting.  This also started in February and is often postprandial.  Not always associated with abdominal pain.  States she is using Zofran at least once a week.  Prior to February, nausea had not been much of an issue.  No specific food triggers though she does report eating [place] on Friday and noticed nausea after that.  GERD is well controlled on Protonix twice daily.  No early satiety. Admits to dysphagia to solids and soft foods intermittently.   Also with increased bloating and increased flatulence.  Notes some change in her bowel habits after having diarrhea earlier this month.  She is no longer requiring Linzess and is having bowel movements daily.  Stools are a little more thin, but no diarrhea and she feels she is emptying well.  Couple episodes of blood in her stools when she had diarrhea, but none since then.  No melena. Known hemorrhoids.  Uses dicyclomine occasionally. Helped somewhat last night with abdominal pain.   No regular NSAIDs aside from aspirin.   Past Medical History:  Diagnosis Date   Allergies    Anemia    Anxiety    Arthritis    Atypical mole 11/03/2006   mid upper back (slight to moderate)   Atypical mole 11/03/2006   lower right back (slight to moderate)   Back pain    Bipolar 1 disorder (HCC)    Borderline diabetic    Chronic constipation    Constipation    Depression    Family history of adverse reaction to  anesthesia    mother-- ponv   Fatty liver    GAD (generalized anxiety disorder)    GERD (gastroesophageal reflux disease)    Hiatal hernia    High triglycerides    History of kidney stones    History of recurrent UTIs    History of sepsis 06/2018   History of stomach ulcers    History of suicidal ideation    Hyperlipidemia    Hypothyroidism    IDA (iron deficiency anemia)    Joint pain    Melanoma ([place]) 11/03/2006   left chest in  situ (excision)   Orthostasis    Palpitations    PONV (postoperative nausea and vomiting)    Prediabetes    Rapid heart rate    Renal calculus, left    Seasonal allergies    Shortness of breath    Sleep disorder, unspecified    per excessive sleeping during the day   Snoring    Swallowing difficulty    Tachycardia    Vertigo    Vitamin D deficiency    Weakness    Wears contact lenses     Past Surgical History:  Procedure Laterality Date   ANTERIOR CERVICAL DECOMP/DISCECTOMY FUSION  02/17/2012   Procedure: ANTERIOR CERVICAL DECOMPRESSION/DISCECTOMY FUSION 2 LEVELS;  Surgeon: Hosie Spangle, MD;  Location: [facility];  Service: Neurosurgery;  Laterality: N/A;  Cervical four-five and Cervical six-seven anterior cervial decompression with fusion plating and bonegraft   ANTERIOR CERVICAL DECOMP/DISCECTOMY FUSION  04-01-2001   _0    C5---6   BIOPSY  07/20/2018   Procedure: BIOPSY;  Surgeon: Daneil Dolin, MD;  Location: [facility];  Service: Endoscopy;;  gastric    BREAST REDUCTION SURGERY Bilateral 1995   [place]  x2   last one 1998   with BILATERAL TUBAL LIGATION   CESAREAN SECTION WITH BILATERAL TUBAL LIGATION     COLONOSCOPY WITH PROPOFOL N/A 07/20/2018   Dr. Gala Romney: Diverticulosis, internal hemorrhoids, 5 mm splenic flexure tubular adenoma removed. Repeat in 5 years due to family history.   CYSTOSCOPY/URETEROSCOPY/HOLMIUM LASER/STENT PLACEMENT Left 08/21/2018   Procedure: CYSTOSCOPY LEFT RETROGRADE PYELOGRAM /URETEROSCOPY/HOLMIUM LASER/STENT PLACEMENT;  Surgeon: Ceasar Mons, MD;  Location[facility];  Service: Urology;  Laterality: Left;   DILATION AND CURETTAGE OF UTERUS  1992   for miscarriage   ENDOMETRIAL ABLATION  2014   ESOPHAGOGASTRODUODENOSCOPY     20 years ago.    ESOPHAGOGASTRODUODENOSCOPY (EGD) WITH PROPOFOL N/A 07/20/2018   Dr. Gala Romney: Small hiatal hernia, erythematous mucosa in the stomach with a healing gastric  ulcer measuring 1 cm, biopsies negative.   [place]   unilateral for infertility   NASAL SINUS SURGERY  2010  approx.   POLYPECTOMY  07/20/2018   Procedure: POLYPECTOMY;  Surgeon: Daneil Dolin, MD;  Location: [facility];  Service: Endoscopy;;   POSTERIOR CERVICAL FUSION/FORAMINOTOMY N/A 08/18/2013   Procedure: CERVICAL FOUR TO CERVICAL SEVEN POSTERIOR CERVICAL FUSION/FORAMINOTOMY LEVEL 3;  Surgeon: Hosie Spangle, MD;  Location: [facility];  Service: Neurosurgery;  Laterality: N/A;  C4-7 posterior cervical fusion with lateral mass fixation   RIGHT/LEFT HEART CATH AND CORONARY ANGIOGRAPHY N/A 12/23/2019   Procedure: RIGHT/LEFT HEART CATH AND CORONARY ANGIOGRAPHY;  Surgeon: Burnell Blanks, MD;  Location: [facility];  Service: Cardiovascular;  Laterality: N/A;    Current Outpatient Medications  Medication Sig Dispense Refill   acetaminophen (TYLENOL) 500 MG tablet Take 2,000 mg by mouth every 6 (six)  hours as needed (pain.).     albuterol (VENTOLIN HFA) 108 (90 Base) MCG/ACT inhaler Inhale 2 puffs into the lungs every 6 (six) hours as needed for wheezing or shortness of breath. 8 g 2   ALPRAZolam (XANAX) 1 MG tablet TAKE 1 TABLET BY MOUTH THREE TIMES DAILY AS NEEDED MAY  TAKE  ADDITIONAL  1/2  TABLET  DURING  THE  DAY  AS  NEEDED 135 tablet 0   aspirin EC 81 MG tablet Take 1 tablet (81 mg total) by mouth daily. Swallow whole. 30 tablet 11   buPROPion (WELLBUTRIN XL) 150 MG 24 hr tablet TAKE 3 TABLETS BY MOUTH ONCE DAILY IN THE MORNING 90 tablet 5   cetirizine (ZYRTEC) 10 MG tablet Take 10 mg by mouth daily.     dicyclomine (BENTYL) 10 MG capsule TAKE 1 CAPSULE BY MOUTH 4 TIMES DAILY BEFORE MEAL(S) AND AT BEDTIME FOR DIARRHEA (Patient taking differently: As needed) 120 capsule 0   estradiol (VIVELLE-DOT) 0.075 MG/24HR Place onto the skin 2 (two) times a week.     ezetimibe (ZETIA) 10 MG tablet Take 1 tablet (10 mg total) by mouth daily. 90 tablet 3    fenofibrate (TRICOR) 145 MG tablet Take 1 tablet (145 mg total) by mouth daily. 30 tablet 11   fluorometholone (FML) 0.1 % ophthalmic suspension Place 1 drop into both eyes 4 (four) times daily as needed (dry/irritated eyes.).     hydrocortisone (ANUSOL-HC) 2.5 % rectal cream Place 1 application rectally 2-4 times daily for rectal bleeding/burning related to hemorrhoids. 30 g 1   hydrOXYzine (ATARAX/VISTARIL) 25 MG tablet Take 1 tablet (25 mg total) by mouth 3 (three) times daily as needed. 90 tablet 1   icosapent Ethyl (VASCEPA) 1 g capsule Take 2 capsules (2 g total) by mouth 2 (two) times daily. 360 capsule 3   lamoTRIgine (LAMICTAL) 200 MG tablet TAKE 2 & 1/2 (TWO & ONE-HALF) TABLETS BY MOUTH ONCE DAILY 75 tablet 5   levothyroxine (SYNTHROID) 75 MCG tablet Take 75 mcg by mouth daily before breakfast.      meclizine (ANTIVERT) 25 MG tablet Take 25 mg by mouth 3 (three) times daily as needed for dizziness.     montelukast (SINGULAIR) 10 MG tablet Take 10 mg by mouth daily.      ondansetron (ZOFRAN) 4 MG tablet Take 1 tablet (4 mg total) by mouth every 6 (six) hours as needed. 30 tablet 3   Oxcarbazepine (TRILEPTAL) 300 MG tablet Take 1 tablet (300 mg total) by mouth 2 (two) times daily. 60 tablet 5   pantoprazole (PROTONIX) 40 MG tablet Take 1 tablet (40 mg total) by mouth 2 (two) times daily with a meal. 180 tablet 3   progesterone (PROMETRIUM) 100 MG capsule Take 100 mg by mouth at bedtime.     rosuvastatin (CRESTOR) 5 MG tablet Take 1 tablet (5 mg total) by mouth daily. 90 tablet 3   sertraline (ZOLOFT) 100 MG tablet Take 2 tablets (200 mg total) by mouth at bedtime. 60 tablet 11   zaleplon (SONATA) 10 MG capsule TAKE 1 CAPSULE BY MOUTH AT BEDTIME AS NEEDED, MAY REPEAT 1 FOR MIDNOCTURNAL AWAKENING AS NEEDED IF THREE HOURS LEFT TO SLEEP 60 capsule 5   metFORMIN (GLUCOPHAGE) 500 MG tablet Take 1 tablet (500 mg total) by mouth 2 (two) times daily with a meal. (Patient not taking: Reported on  07/31/2020) 60 tablet 0   pramipexole (MIRAPEX) 1 MG tablet Take 1 tablet (1 mg total) by mouth  2 (two) times daily. (Patient taking differently: Take 2 mg by mouth at bedtime.) 60 tablet 5   torsemide (DEMADEX) 20 MG tablet Take 20 mg by mouth daily.     No current facility-administered medications for this visit.    Allergies as of 07/31/2020 - Review Complete 07/31/2020  Allergen Reaction Noted   Iohexol Hives 11/03/2007   Rosuvastatin Other (See Comments) 02/09/2020    Family History  Problem Relation Age of Onset   Asthma Mother    Rheum arthritis Mother    Non-Hodgkin's lymphoma Mother    Congestive Heart Failure Mother    Atrial fibrillation Mother    Diabetes Mother    Hyperlipidemia Mother    Cancer Mother    Colon cancer Father    Multiple myeloma Father    Hypertension Father    Cancer Father    Depression Father    Anxiety disorder Father    Alcoholism Father    Obesity Father    Allergies Other        entire family(mom and dad)   Diabetes Maternal Grandmother    Congestive Heart Failure Maternal Grandmother    Mental illness Paternal Grandmother    Colon cancer Paternal Grandfather    Bone cancer Paternal Grandfather     Social History   Socioeconomic History   Marital status: Married    Spouse name: Not on file   Number of children: 2   Years of education: Not on file   Highest education level: Not on file  Occupational History   Occupation: disability  Tobacco Use   Smoking status: Former    Packs/day: 1.00    Years: 5.00    Pack years: 5.00    Types: Cigarettes    Quit date: 02/09/2002    Years since quitting: 18.4   Smokeless tobacco: Never  Vaping Use   Vaping Use: Never used  Substance and Sexual Activity   Alcohol use: No   Drug use: No   Sexual activity: Yes    Birth control/protection: Surgical  Other Topics Concern   Not on file  Social History Narrative   Step brother-2   Step sister-2   Half sister -1 healthy   Social  Determinants of Health   Financial Resource Strain: Not on file  Food Insecurity: Not on file  Transportation Needs: Not on file  Physical Activity: Not on file  Stress: Not on file  Social Connections: Not on file    Review of Systems: Gen: Denies fever, chills, cold or flu like symptoms, presyncope, or syncope. Admits to a lot of dizziness.  CV: Denies chest pain or palpitations.  Resp: Denies dyspnea or cough.  Mild GI: See HPI Heme: See HPI  Physical Exam: BP 123/74   Pulse 89   Temp (!) 97.1 F (36.2 C)   Ht 5' 6" (1.676 m)   Wt 236 lb (107 kg)   BMI 38.09 kg/m  General:   Alert and oriented. No distress noted. Pleasant and cooperative.  Head:  Normocephalic and atraumatic. Eyes:  Conjuctiva clear without scleral icterus. Heart:  S1, S2 present without murmurs appreciated. Lungs:  Clear to auscultation bilaterally. No wheezes, rales, or rhonchi. No distress.  Abdomen:  +BS. Abdomen is obese but soft. Mild asymmetry with right side more prominent than left. Mild TTP of RUQ, right mid abdomen, and RLQ. No rebound or guarding. No appreciable mass/hernia.  Msk:  Symmetrical without gross deformities. Normal posture. Extremities:  Without edema. Neurologic:  Alert and  oriented x4 Psych:  Normal mood and affect.   Assessment:  50 y.o.female with history of GERD, PUD, constipation, hemorrhoids, fatty liver presenting today with primary concern of right sided abdominal wall bulge and right sided abdominal discomfort, but also reporting a constellation of other GI symptoms including intermittent nausea without vomiting, dysphagia, change in bowel habits, and increased gas/bloating.   Patient reports since February, she has had a right sided abdominal bulge adjacent to her umbilicus that fluctuates in size, larger in the evenings, but improves with laying down. Also with constant right sided discomfort that can wax and wayne without specific trigger, generalized abdominal cramping  at times, and intermittent nausea that can be related to worsening abdominal pain or postprandial without worsening pain. No specific food triggers for her symptoms and pain/abdominal bulge is not improved by BMs. She had noticed change in bowels over the last couple of weeks following acute onset of diarrhea earlier this month. Diarrhea resolved, no longer requiring Linzess as her bowels are moving daily, but are a bit more thin in caliber. Also with significant flatulence. Couple isolated episodes of brbpr with rectal burning when she had diarrhea, but none since. Known history of hemorrhoids with colonoscopy up to date in 2020. GERD remains well controlled on Protonix 40 mg BID.  On exam, she has abdominal wall asymmetry with right side more prominent than left, but I am not able to appreciate a a focal mass/hernia. Mild TTP along her entire right abdomen. CT abdomen/pelvis without contrast and US abdomen in February were unrevealing.   Etiology of her constellation of symptoms is not clear. Can't rule out developing periumbilical/ventral hernia though this may just be asymmetry of subcutaneous adipose tissue. Difficult to say whether this "bulge" is related to her nausea/abdominal pain. Additional differential includes: gastritis, duodenitis, PUD, H. Pylori, right sided smoldering diverticulitis/colitis, or post infectious IBS. Less likely, but can't rule out appendicitis or biliary dyskinesia.   With worsening symptoms, will plan to repeat CT A/P with oral contrast only due to IV contrast shortage and update labs. Planning to arrange EGD due to reported dysphagia which will also help evaluate her nausea/right sided abdominal pain if CT and labs are unrevealing.   Dysphagia: Solid and soft food dysphagia. Will arrange EGD to evaluate for esophageal web, ring, structure, EOE, and less likely malignancy.   Flatulence/bloating: Possibly related to dietary intolerances, celiac disease, recent diarrheal  illness with change in gut flora, and possibly SIBO. Counseled to avoid common gas producing items, will check for celiac disease, and recommended starting a daily probiotic.    Plan: CBC, CMP, Lipase, TTG IgA, IgA total.  CT A/P with oral contrast only ASAP.  EGD with possible esophageal dilation with propofol with Dr. Gala Romney in the near future. The risks, benefits, and alternatives have been discussed with the patient in detail. The patient states understanding and desires to proceed.   ASA III  Avoid common gas producing items including cabbage, broccoli, Brussels sprouts, beans, cauliflower, artificial sweeteners, carbonated beverages, hard candies, drinking through a straw.  Start a daily probiotic.  Continue pantoprazole 40 mg twice daily.  Reinforced GERD diet/lifestyle.  Further recommendations to follow CT and labs.  We will plan to follow-up in the office after EGD.  Could consider scheduled dicyclomine to help with abdominal discomfort/?postinfectious IBS if CT and labs are unrevealing.     Aliene Altes, PA-C [facility] 07/31/2020

## 2020-07-31 NOTE — Telephone Encounter (Signed)
PA for CT A/P pending via [facility] website. Tracking # [id number]. Needs clinicals sent in.

## 2020-07-31 NOTE — Telephone Encounter (Signed)
Patient has messaged multiple times. Previously saw Magda Paganini in the past. Hasn't been seen since July 2021. I can't do anything additional over the phone.   She needs an office visit. There are a lot of MyChart messages, and we are limited with what we can do without a physical exam.   Erline Levine, is there anything this week available? Needs office visit this week or at latest the next. Previously has seen Magda Paganini, but I know she is off this week.

## 2020-07-31 NOTE — Patient Instructions (Addendum)
We will arrange for her to have an upper endoscopy with possible dilation of your esophagus in the near future with Dr. Gala Romney.  Please have labs completed at [facility].  We will arrange for you to have a CT of your abdomen and pelvis in the near future at [facility].  Continue pantoprazole 40 mg twice daily 30 minutes before breakfast and dinner.  Follow a GERD diet:  Avoid fried, fatty, greasy, spicy, citrus foods. Avoid caffeine and carbonated beverages. Avoid chocolate. Try eating 4-6 small meals a day rather than 3 large meals. Do not eat within 3 hours of laying down. Prop head of bed up on wood or bricks to create a 6 inch incline.  Avoid common gas producing items including cabbage, broccoli, Brussels sprouts, beans, cauliflower, artificial sweeteners, carbonated beverages, hard candies, drinking through a straw.  You may also try adding a daily probiotic to help with gas/bloating.  Further recommendations to follow CT and lab results.   Aliene Altes, PA-C [facility]

## 2020-08-01 ENCOUNTER — Ambulatory Visit: Payer: 59 | Admitting: Allergy and Immunology

## 2020-08-01 ENCOUNTER — Encounter: Payer: Self-pay | Admitting: *Deleted

## 2020-08-01 ENCOUNTER — Telehealth: Payer: Self-pay | Admitting: *Deleted

## 2020-08-01 NOTE — Telephone Encounter (Signed)
PA approved. Not # [id number] valid until 09/15/2020  Called central scheduling and CT scheduled for 6/27 at 7:00pm, arrival 6:45pm, npo 4 hrs prior. Pick up oral prep. [facility] message sent to patient

## 2020-08-01 NOTE — Telephone Encounter (Signed)
PA approved via [facility] for EGD/DILATION.  The notification/prior authorization reference number is [id number]. DOS 10/05/2020-01/03/2021  Your Notification/Prior Authorization submission has been Approved and no further action is required for this request

## 2020-08-02 ENCOUNTER — Encounter: Payer: Self-pay | Admitting: *Deleted

## 2020-08-03 ENCOUNTER — Encounter (HOSPITAL_COMMUNITY): Payer: Self-pay

## 2020-08-03 LAB — CBC WITH DIFFERENTIAL/PLATELET
Absolute Monocytes: 817 cells/uL (ref 200–950)
Basophils Absolute: 76 cells/uL (ref 0–200)
Basophils Relative: 0.8 %
Eosinophils Absolute: 266 cells/uL (ref 15–500)
Eosinophils Relative: 2.8 %
HCT: 39.1 % (ref 35.0–45.0)
Hemoglobin: 13 g/dL (ref 11.7–15.5)
Lymphs Abs: 2936 cells/uL (ref 850–3900)
MCH: 28.7 pg (ref 27.0–33.0)
MCHC: 33.2 g/dL (ref 32.0–36.0)
MCV: 86.3 fL (ref 80.0–100.0)
MPV: 9.9 fL (ref 7.5–12.5)
Monocytes Relative: 8.6 %
Neutro Abs: 5406 cells/uL (ref 1500–7800)
Neutrophils Relative %: 56.9 %
Platelets: 473 10*3/uL — ABNORMAL HIGH (ref 140–400)
RBC: 4.53 10*6/uL (ref 3.80–5.10)
RDW: 12.6 % (ref 11.0–15.0)
Total Lymphocyte: 30.9 %
WBC: 9.5 10*3/uL (ref 3.8–10.8)

## 2020-08-03 LAB — COMPLETE METABOLIC PANEL WITH GFR
AG Ratio: 2.1 (calc) (ref 1.0–2.5)
ALT: 33 U/L — ABNORMAL HIGH (ref 6–29)
AST: 27 U/L (ref 10–35)
Albumin: 5 g/dL (ref 3.6–5.1)
Alkaline phosphatase (APISO): 92 U/L (ref 37–153)
BUN/Creatinine Ratio: 19 (calc) (ref 6–22)
BUN: 21 mg/dL (ref 7–25)
CO2: 27 mmol/L (ref 20–32)
Calcium: 10.3 mg/dL (ref 8.6–10.4)
Chloride: 106 mmol/L (ref 98–110)
Creat: 1.09 mg/dL — ABNORMAL HIGH (ref 0.50–1.05)
GFR, Est African American: 69 mL/min/{1.73_m2} (ref >=60)
GFR, Est Non African American: 59 mL/min/{1.73_m2} — ABNORMAL LOW (ref >=60)
Globulin: 2.4 g/dL (calc) (ref 1.9–3.7)
Glucose, Bld: 97 mg/dL (ref 65–99)
Potassium: 5.2 mmol/L (ref 3.5–5.3)
Sodium: 142 mmol/L (ref 135–146)
Total Bilirubin: 0.4 mg/dL (ref 0.2–1.2)
Total Protein: 7.4 g/dL (ref 6.1–8.1)

## 2020-08-03 LAB — IGA: Immunoglobulin A: 113 mg/dL (ref 47–310)

## 2020-08-03 LAB — TISSUE TRANSGLUTAMINASE, IGA: (tTG) Ab, IgA: <1 U/mL

## 2020-08-03 LAB — LIPASE: Lipase: 18 U/L (ref 7–60)

## 2020-08-03 NOTE — Telephone Encounter (Signed)
Nothing we need to do for now.  As long as she stays in the same general range, we can just follow-up when she sees [place] at her next appointment.  Her previous workup for MPN's has been unremarkable - suspect reactive process. Thanks!

## 2020-08-03 NOTE — Telephone Encounter (Signed)
This has been addressed. See lab result note.

## 2020-08-07 ENCOUNTER — Other Ambulatory Visit: Payer: Self-pay

## 2020-08-07 ENCOUNTER — Ambulatory Visit (HOSPITAL_COMMUNITY)
Admission: RE | Admit: 2020-08-07 | Discharge: 2020-08-07 | Disposition: A | Discharge status: Home / Self Care | Payer: 59 | Source: Ambulatory Visit | Attending: Gastroenterology | Admitting: Gastroenterology

## 2020-08-07 DIAGNOSIS — R1900 Intra-abdominal and pelvic swelling, mass and lump, unspecified site: Secondary | ICD-10-CM | POA: Insufficient documentation

## 2020-08-08 ENCOUNTER — Other Ambulatory Visit: Payer: Self-pay | Admitting: Gastroenterology

## 2020-08-08 MED ORDER — CIPROFLOXACIN HCL 250 MG PO TABS: 250.0000 mg | ORAL_TABLET | Freq: Two times a day (BID) | ORAL | 0 refills | Status: AC

## 2020-08-08 MED ORDER — METRONIDAZOLE 500 MG PO TABS: 500.0000 mg | ORAL_TABLET | Freq: Three times a day (TID) | ORAL | 0 refills | Status: AC

## 2020-08-08 NOTE — Progress Notes (Signed)
Cipro and Flagyl sent to pharmacy.

## 2020-08-13 ENCOUNTER — Other Ambulatory Visit: Payer: Self-pay | Admitting: Physician Assistant

## 2020-08-15 ENCOUNTER — Other Ambulatory Visit: Payer: Self-pay

## 2020-08-15 ENCOUNTER — Telehealth: Payer: Self-pay | Admitting: Gastroenterology

## 2020-08-15 MED ORDER — PEG 3350-KCL-NA BICARB-NACL 420 G PO SOLR: 4000.0000 mL | ORAL | 0 refills | Status: DC

## 2020-08-15 NOTE — Telephone Encounter (Signed)
Spoke with patient over the phone regarding patient message sent this morning.  She has completed her antibiotics for diverticulitis and her abdominal pain has resolved.  She continues with frequent thin caliber bowel movements.  Denies watery stools.  She is not taking Bentyl or Linzess.  She did take Linzess 1 day and did not notice any change in her bowel movements.  She has started a daily probiotic.  Notes her stools became a little darker with antibiotics, but denies pitch black stools or bright red blood per rectum.  Discussed that frequent, thin caliber BMs may be secondary to underlying constipation with overflow as she was noted to have moderate stool burden on her CT scan.  She is worried about taking Linzess, MiraLAX, or anything to increase bowel frequency at this time.  She prefers to continue to monitor for a few more days.  Advised to call back with a progress report on Thursday or Friday to let us know how she is doing. Call sooner if needed.   Also discussed that she would need repeat colonoscopy in 6 to 8 weeks due to diverticulitis and last colonoscopy in 2020.  She is currently scheduled for EGD in August.  She prefers to have EGD and colonoscopy performed at the same time.  Advised that this would likely delay her EGD, but she is okay with this.  [facility] Clinical Pool:  Patient needs colonoscopy added to her currently scheduled EGD with Dr. Gala Romney.  Dx: Diverticulitis, change in stool caliber.   Day of colon prep- may take metformin as prescribed.  Day of procedure: No AM diabetes medications.   Patient is aware that her procedures will likely be at a later date, but she is okay with this.

## 2020-08-15 NOTE — Telephone Encounter (Signed)
Called pt, TCS added to EGD/-/+DIL scheduled for 10/05/20. Time changed to 11:15am. Rx for prep sent to pharmacy. New instructions mailed. Endo scheduler informed. New orders entered.

## 2020-08-15 NOTE — Telephone Encounter (Signed)
PA for TCS submitted via [facility] website. PA# [id number], valid 10/05/20-01/03/21. PA had already been received for EGD/DIL.

## 2020-08-21 NOTE — Telephone Encounter (Signed)
Dme says they are getting some inventory in and they will contact her as soon as they have one for her.

## 2020-08-22 ENCOUNTER — Ambulatory Visit: Payer: Self-pay | Admitting: Internal Medicine

## 2020-08-25 ENCOUNTER — Telehealth: Payer: Self-pay | Admitting: Physician Assistant

## 2020-08-25 NOTE — Telephone Encounter (Signed)
Pt informed

## 2020-08-25 NOTE — Telephone Encounter (Signed)
Please review

## 2020-08-25 NOTE — Telephone Encounter (Signed)
Next visit is 10/18/20. Kristen [name] said that her husband has been having seizures since April and she thinks she might need to come off the [place]. She said she is exhausted, irritable and snappy to her husband  as she is taking care of him. He tells her he is a burden. She cant wake up when she takes the Qulin. She wonders if she can be weaned off of it temporarily or perhaps have the medicine adjusted? Her phone number is [phone].

## 2020-08-25 NOTE — Telephone Encounter (Signed)
If she still has Sonata, have her take only 1 at bedtime (don't repeat 2nd dose.) Just for a few nights and then stop it. If she is already doing that, have her open up the capsule, sprinkle about half the contents in a tsp of applesauce or something and take that at bedtime prn.

## 2020-08-29 ENCOUNTER — Other Ambulatory Visit: Payer: Self-pay | Admitting: Gastroenterology

## 2020-08-29 ENCOUNTER — Other Ambulatory Visit: Payer: Self-pay | Admitting: Physician Assistant

## 2020-08-29 ENCOUNTER — Telehealth: Payer: Self-pay | Admitting: Internal Medicine

## 2020-08-29 DIAGNOSIS — K5732 Diverticulitis of large intestine without perforation or abscess without bleeding: Secondary | ICD-10-CM

## 2020-08-29 MED ORDER — AMOXICILLIN-POT CLAVULANATE 875-125 MG PO TABS: 1.0000 | ORAL_TABLET | Freq: Three times a day (TID) | ORAL | 0 refills | Status: AC

## 2020-08-29 NOTE — Telephone Encounter (Signed)
Noted. Message sent to endo to cancel TCS part but leave EGD/DIL.

## 2020-08-29 NOTE — Telephone Encounter (Signed)
Received notification that pharmacy needed clarification on this prescription regarding dosing, 3 times daily versus twice daily.   Please call the pharmacy and let them know prescription is for Augmentin 875 mg -125 mg, 3 times daily for 10 days for diverticulitis.

## 2020-08-29 NOTE — Telephone Encounter (Signed)
PATIENT CALLED AND SAID THE BEST TIME TO CALL HER IS NOW UNTIL 2 AND AFTER 3  AT [phone]  SHE ALSO DID NOT GO TO THE ER

## 2020-08-29 NOTE — Telephone Encounter (Signed)
Spoke with patient.  States last night, she developed recurrent left lower quadrant abdominal pain.  Feels like it did prior to her last course of antibiotics for diverticulitis.  Pain has been persistent with no aggravating or relieving factors.  She is having about 3 bowel movements a day with MiraLAX and Linzess.  She feels bloated and has a lot of gas.  Denies fever, nausea, vomiting, BRBPR, melena.  She has been eating frequent raw vegetables and beans which may be contributing to gas/bloating.  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  To limit radiation exposure, we will empirically treat with another round of antibiotics.  She was treated with Cipro and Flagyl for her last episode.  We will treat with Augmentin 875 mg/125 mg 3 times daily x10 days.  Recommended clear liquid diet for the next 24 to 48 hours. Also discussed avoiding common gas producing items. Requested progress report on Monday or call sooner if any worsening symptoms.  We will also need to cancel her colonoscopy for now as this is only 5 weeks from now. Recommend regrouping in the office in 4-6 weeks and arranging colonoscopy at that time.   She also tells she saw her urologist and gynecologist and everything checked out fine.  [facility] Clinical Pool: Suspect patient has recurrent diverticulitis. We are going to need to cancel TCS for now. Can keep EGD as planned.

## 2020-08-30 NOTE — Telephone Encounter (Signed)
Spoke to associate at  [facility] in [place]. Stated that pt. Has already picked medication up.

## 2020-09-09 ENCOUNTER — Other Ambulatory Visit: Payer: Self-pay | Admitting: Psychiatry

## 2020-09-10 ENCOUNTER — Other Ambulatory Visit: Payer: Self-pay | Admitting: Physician Assistant

## 2020-09-11 ENCOUNTER — Other Ambulatory Visit: Payer: Self-pay

## 2020-09-11 DIAGNOSIS — R000 Tachycardia, unspecified: Secondary | ICD-10-CM

## 2020-09-11 DIAGNOSIS — R0600 Dyspnea, unspecified: Secondary | ICD-10-CM

## 2020-09-11 DIAGNOSIS — R0683 Snoring: Secondary | ICD-10-CM

## 2020-09-11 DIAGNOSIS — E782 Mixed hyperlipidemia: Secondary | ICD-10-CM

## 2020-09-11 DIAGNOSIS — R0609 Other forms of dyspnea: Secondary | ICD-10-CM

## 2020-09-11 DIAGNOSIS — E781 Pure hyperglyceridemia: Secondary | ICD-10-CM

## 2020-09-11 MED ORDER — EZETIMIBE 10 MG PO TABS: 10.0000 mg | ORAL_TABLET | Freq: Every day | ORAL | 0 refills | Status: DC

## 2020-09-18 ENCOUNTER — Other Ambulatory Visit: Payer: Self-pay | Admitting: Gastroenterology

## 2020-09-18 DIAGNOSIS — K5900 Constipation, unspecified: Secondary | ICD-10-CM

## 2020-09-18 MED ORDER — LUBIPROSTONE 24 MCG PO CAPS: 24.0000 ug | ORAL_CAPSULE | Freq: Two times a day (BID) | ORAL | 3 refills | Status: DC

## 2020-09-18 NOTE — Telephone Encounter (Signed)
Spoke with patient.  Feels constipated and that BMs are incomplete.  Currently taking Linzess 290 mcg daily along with MiraLAX daily.  She has never tried Airline [employer].  I suspect her bloating is likely related to underlying constipation.  Discussed intermittent left lower quadrant abdominal pain.  States this is maybe once a week and last for about an hour then resolves.  We will try switching Linzess to Amitiza 24 mcg twice daily.  She is to monitor for any worsening abdominal pain.  Requested progress report in a couple of weeks.

## 2020-09-19 ENCOUNTER — Other Ambulatory Visit: Payer: Self-pay | Admitting: Gastroenterology

## 2020-09-19 ENCOUNTER — Telehealth: Payer: Self-pay | Admitting: Gastroenterology

## 2020-09-19 NOTE — Progress Notes (Signed)
Error

## 2020-09-19 NOTE — Telephone Encounter (Signed)
Kristen [name], I sent in a prescription for Amitiza 24 mcg twice daily for patient yesterday.  Medication is too expensive.  Can we please look into whether a prior authorization is needed.  If no prior authorization, may be a tier exception as she has failed Linzess and MiraLAX or patient assistance?

## 2020-09-20 NOTE — Telephone Encounter (Signed)
Noted  

## 2020-09-20 NOTE — Telephone Encounter (Signed)
Noted . Will do a PA if needed.

## 2020-09-20 NOTE — Telephone Encounter (Signed)
Phoned and spoke with the pt and was advised that her and Cyril Mourning had come up with another alternative to do. I advised the pt to let me try and get it approved (Amitiza 24 mcg) first and if it doesn't work then she can go with the suggestions that the Dr came up with. Pt agrees. PA done and waiting on a response.

## 2020-09-21 NOTE — Telephone Encounter (Signed)
We can see how she does with increasing MiraLAX to BID. Please let her know and have her call with any ongoing issues.

## 2020-09-21 NOTE — Telephone Encounter (Signed)
Phoned and spoke with the pt reminded her of increasing MiraLAX to BID and for her to call us with any ongoing issues. Pt expressed understanding.

## 2020-09-21 NOTE — Telephone Encounter (Signed)
I looked up pt's formulary and all of the the other Rx's we prescribe are tier 3 and need a PA but they also have a quantity limit. So I will wait to see what you want to do next.

## 2020-09-21 NOTE — Telephone Encounter (Signed)
Noted  

## 2020-09-21 NOTE — Telephone Encounter (Signed)
Kristen [name], I checked Cover My meds regarding pt's Amitiza and it is actually on her preferred drug list and no PA was needed. Do you want her to do the recommendations you all talked about or do you want me to look at her formulary to see what's cheaper?

## 2020-09-27 NOTE — Patient Instructions (Addendum)
HADLEY BOMBOY  09/27/2020     '@PREFPERIOPPHARMACY'$ @   Your procedure is scheduled on  10/05/2020.   Report to [facility] at  0930 A.M.   Call this number if you have problems the morning of surgery:  [phone]   Remember:  Follow the diet and prep instructions given to you by the office.    Take these medicines the morning of surgery with A SIP OF WATER        Xanax (if needed), wellbutrin, hydroxyzine, lamictal, levothyroxine, antivert(if needed), zofran (if needed), protonix, zoloft.     Do not wear jewelry, make-up or nail polish.  Do not wear lotions, powders, or perfumes, or deodorant.  Do not shave 48 hours prior to surgery.  Men may shave face and neck.  Do not bring valuables to the hospital.  [facility] is not responsible for any belongings or valuables.  Contacts, dentures or bridgework may not be worn into surgery.  Leave your suitcase in the car.  After surgery it may be brought to your room.  For patients admitted to the hospital, discharge time will be determined by your treatment team.  Patients discharged the day of surgery will not be allowed to drive home and must have someone with them for 24 hours.    Special instructions:   DO NOT smoke tobacco or vape for 24 hours before your procedure.  Please read over the following fact sheets that you were given. Anesthesia Post-op Instructions and Care and Recovery After Surgery      Upper Endoscopy, Adult, Care After This sheet gives you information about how to care for yourself after your procedure. Your health care provider may also give you more specific instructions. If you have problems or questions, contact your health careprovider. What can I expect after the procedure? After the procedure, it is common to have: A sore throat. Mild stomach pain or discomfort. Bloating. Nausea. Follow these instructions at home:  Follow instructions from your health care provider about what to  eat or drink after your procedure. Return to your normal activities as told by your health care provider. Ask your health care provider what activities are safe for you. Take over-the-counter and prescription medicines only as told by your health care provider. If you were given a sedative during the procedure, it can affect you for several hours. Do not drive or operate machinery until your health care provider says that it is safe. Keep all follow-up visits as told by your health care provider. This is important. Contact a health care provider if you have: A sore throat that lasts longer than one day. Trouble swallowing. Get help right away if: You vomit blood or your vomit looks like coffee grounds. You have: A fever. Bloody, black, or tarry stools. A severe sore throat or you cannot swallow. Difficulty breathing. Severe pain in your chest or abdomen. Summary After the procedure, it is common to have a sore throat, mild stomach discomfort, bloating, and nausea. If you were given a sedative during the procedure, it can affect you for several hours. Do not drive or operate machinery until your health care provider says that it is safe. Follow instructions from your health care provider about what to eat or drink after your procedure. Return to your normal activities as told by your health care provider. This information is not intended to replace advice given to you by your health care provider. Make sure you  discuss any questions you have with your healthcare provider. Document Revised: 01/26/2019 Document Reviewed: 06/30/2017 Elsevier Patient Education  2022 [place]. https://www.asge.org/home/for-patients/patient-information/understanding-eso-dilation-updated">  Esophageal Dilatation Esophageal dilatation, also called esophageal dilation, is a procedure to widen or open a blocked or narrowed part of the esophagus. The esophagus is the part of the body that moves food and liquid  from the mouth to the stomach. You may need this procedure if: You have a buildup of scar tissue in your esophagus that makes it difficult, painful, or impossible to swallow. This can be caused by gastroesophageal reflux disease (GERD). You have cancer of the esophagus. There is a problem with how food moves through your esophagus. In some cases, you may need this procedure repeated at a later time to dilatethe esophagus gradually. Tell a health care provider about: Any allergies you have. All medicines you are taking, including vitamins, herbs, eye drops, creams, and over-the-counter medicines. Any problems you or family members have had with anesthetic medicines. Any blood disorders you have. Any surgeries you have had. Any medical conditions you have. Any antibiotic medicines you are required to take before dental procedures. Whether you are pregnant or may be pregnant. What are the risks? Generally, this is a safe procedure. However, problems may occur, including: Bleeding due to a tear in the lining of the esophagus. A hole, or perforation, in the esophagus. What happens before the procedure? Ask your health care provider about: Changing or stopping your regular medicines. This is especially important if you are taking diabetes medicines or blood thinners. Taking medicines such as aspirin and ibuprofen. These medicines can thin your blood. Do not take these medicines unless your health care provider tells you to take them. Taking over-the-counter medicines, vitamins, herbs, and supplements. Follow instructions from your health care provider about eating or drinking restrictions. Plan to have a responsible adult take you home from the hospital or clinic. Plan to have a responsible adult care for you for the time you are told after you leave the hospital or clinic. This is important. What happens during the procedure? You may be given a medicine to help you relax (sedative). A numbing  medicine may be sprayed into the back of your throat, or you may gargle the medicine. Your health care provider may perform the dilatation using various surgical instruments, such as: Simple dilators. This instrument is carefully placed in the esophagus to stretch it. Guided wire bougies. This involves using an endoscope to insert a wire into the esophagus. A dilator is passed over this wire to enlarge the esophagus. Then the wire is removed. Balloon dilators. An endoscope with a small balloon is inserted into the esophagus. The balloon is inflated to stretch the esophagus and open it up. The procedure may vary among health care providers and hospitals. What can I expect after the procedure? Your blood pressure, heart rate, breathing rate, and blood oxygen level will be monitored until you leave the hospital or clinic. Your throat may feel slightly sore and numb. This will get better over time. You will not be allowed to eat or drink until your throat is no longer numb. When you are able to drink, urinate, and sit on the edge of the bed without nausea or dizziness, you may be able to return home. Follow these instructions at home: Take over-the-counter and prescription medicines only as told by your health care provider. If you were given a sedative during the procedure, it can affect you for several hours. Do  not drive or operate machinery until your health care provider says that it is safe. Plan to have a responsible adult care for you for the time you are told. This is important. Follow instructions from your health care provider about any eating or drinking restrictions. Do not use any products that contain nicotine or tobacco, such as cigarettes, e-cigarettes, and chewing tobacco. If you need help quitting, ask your health care provider. Keep all follow-up visits. This is important. Contact a health care provider if: You have a fever. You have pain that is not relieved by medicine. Get  help right away if: You have chest pain. You have trouble breathing. You have trouble swallowing. You vomit blood. You have black, tarry, or bloody stools. These symptoms may represent a serious problem that is an emergency. Do not wait to see if the symptoms will go away. Get medical help right away. Call your local emergency services (911 in the [place] Do not drive yourself to the hospital. Summary Esophageal dilatation, also called esophageal dilation, is a procedure to widen or open a blocked or narrowed part of the esophagus. Plan to have a responsible adult take you home from the hospital or clinic. For this procedure, a numbing medicine may be sprayed into the back of your throat, or you may gargle the medicine. Do not drive or operate machinery until your health care provider says that it is safe. This information is not intended to replace advice given to you by your health care provider. Make sure you discuss any questions you have with your healthcare provider. Document Revised: 06/16/2019 Document Reviewed: 06/16/2019 Elsevier Patient Education  [place] After This sheet gives you information about how to care for yourself after your procedure. Your health care provider may also give you more specific instructions. If you have problems or questions, contact your health careprovider. What can I expect after the procedure? After the procedure, it is common to have: Tiredness. Forgetfulness about what happened after the procedure. Impaired judgment for important decisions. Nausea or vomiting. Some difficulty with balance. Follow these instructions at home: For the time period you were told by your health care provider:     Rest as needed. Do not participate in activities where you could fall or become injured. Do not drive or use machinery. Do not drink alcohol. Do not take sleeping pills or medicines that cause drowsiness. Do not  make important decisions or sign legal documents. Do not take care of children on your own. Eating and drinking Follow the diet that is recommended by your health care provider. Drink enough fluid to keep your urine pale yellow. If you vomit: Drink water, juice, or soup when you can drink without vomiting. Make sure you have little or no nausea before eating solid foods. General instructions Have a responsible adult stay with you for the time you are told. It is important to have someone help care for you until you are awake and alert. Take over-the-counter and prescription medicines only as told by your health care provider. If you have sleep apnea, surgery and certain medicines can increase your risk for breathing problems. Follow instructions from your health care provider about wearing your sleep device: Anytime you are sleeping, including during daytime naps. While taking prescription pain medicines, sleeping medicines, or medicines that make you drowsy. Avoid smoking. Keep all follow-up visits as told by your health care provider. This is important. Contact a health care provider if: You keep  feeling nauseous or you keep vomiting. You feel light-headed. You are still sleepy or having trouble with balance after 24 hours. You develop a rash. You have a fever. You have redness or swelling around the IV site. Get help right away if: You have trouble breathing. You have new-onset confusion at home. Summary For several hours after your procedure, you may feel tired. You may also be forgetful and have poor judgment. Have a responsible adult stay with you for the time you are told. It is important to have someone help care for you until you are awake and alert. Rest as told. Do not drive or operate machinery. Do not drink alcohol or take sleeping pills. Get help right away if you have trouble breathing, or if you suddenly become confused. This information is not intended to replace  advice given to you by your health care provider. Make sure you discuss any questions you have with your healthcare provider. Document Revised: 10/14/2019 Document Reviewed: 12/31/2018 Elsevier Patient Education  2022 [employer].

## 2020-09-29 ENCOUNTER — Other Ambulatory Visit: Payer: Self-pay | Admitting: Psychiatry

## 2020-09-29 ENCOUNTER — Other Ambulatory Visit: Payer: Self-pay | Admitting: Physician Assistant

## 2020-10-02 ENCOUNTER — Encounter (HOSPITAL_COMMUNITY): Payer: Self-pay

## 2020-10-02 ENCOUNTER — Telehealth: Payer: Self-pay | Admitting: Gastroenterology

## 2020-10-02 ENCOUNTER — Encounter (HOSPITAL_COMMUNITY)
Admission: RE | Admit: 2020-10-02 | Discharge: 2020-10-02 | Disposition: A | Discharge status: Home / Self Care | Payer: 59 | Source: Ambulatory Visit | Attending: Internal Medicine | Admitting: Internal Medicine

## 2020-10-02 ENCOUNTER — Other Ambulatory Visit: Payer: Self-pay

## 2020-10-02 ENCOUNTER — Other Ambulatory Visit (HOSPITAL_COMMUNITY): Payer: 59

## 2020-10-02 DIAGNOSIS — Z01812 Encounter for preprocedural laboratory examination: Secondary | ICD-10-CM | POA: Insufficient documentation

## 2020-10-02 HISTORY — DX: Sleep apnea, unspecified: G47.30

## 2020-10-02 LAB — PREGNANCY, URINE: Preg Test, Ur: NEGATIVE

## 2020-10-02 NOTE — Telephone Encounter (Signed)
Constipation, patient advised request on 09/30/2020.  See separate message from patient for details.  Spoke with patient today over the phone.  She has had 1 bowel movement so far today.  She did not take Linzess this morning due to having her preop appointment.  She did take MiraLAX this morning.  Stool was not hard, but she did have to strain.  Saturday, she had to manually remove some harder stool from her rectum.  Otherwise, her stools seem to be stringy/mushy.  Reports usually, with Linzess and MiraLAX, she will have 2-3 bowel movements in the morning, but nothing after noon.  However, she feels she should still be moving her bowels as she feels bloated and gassy.  Feels her bowel movements are incomplete and if she could have a better bowel movement, she would feel better.  Generalized abdominal discomfort  States that it is more of a soreness and again would feel better if she had a good BM.  She is not requiring any medications to help with her abdominal pain.  States it is not similar to her prior diverticulitis episode.  Denies nausea, vomiting, BRBPR, melena, fever, chills.  Asked patient to press on her abdomen today while we were on the phone; she denies any worsening pain with palpation.    I encouraged her to go ahead and take her Linzess today. She was also given MiraLAX prep instructions.  She wrote this down and states she will complete this tomorrow. She will call with a progress report tomorrow evening or Wednesday. Advised that I will discuss with Dena other medication options as Linzess is no longer work for her.  Dena:  We need to find out what other constipation medications will be portable for her. See prior telephone note 09/19/2020.  You stated Amitiza did not need PA.  At a later message on that same thread, he stated all other prescriptions we prescribe are tier 3 and would need prior authorization quantity limit I am not exactly sure which constipation medications you are  referring to.  However, we need to find another option for her either through prior authorization, tier exception, or patient assistance. Would like to try Amitiza 24 mcg twice daily or Trulance 3 mg daily if we can find out something on these.

## 2020-10-03 NOTE — Telephone Encounter (Signed)
Information gathered for [place]. Will look at tomorrow

## 2020-10-04 NOTE — Telephone Encounter (Signed)
Putting information regarding pt's preferred drug list on your desk

## 2020-10-05 ENCOUNTER — Encounter (HOSPITAL_COMMUNITY): Payer: Self-pay | Admitting: Internal Medicine

## 2020-10-05 ENCOUNTER — Ambulatory Visit (HOSPITAL_COMMUNITY)
Admission: RE | Admit: 2020-10-05 | Discharge: 2020-10-05 | Disposition: A | Discharge status: Home / Self Care | Payer: 59 | Attending: Internal Medicine | Admitting: Internal Medicine

## 2020-10-05 ENCOUNTER — Encounter (HOSPITAL_COMMUNITY)
Admission: RE | Disposition: A | Discharge status: Home / Self Care | Payer: Self-pay | Source: Home / Self Care | Attending: Internal Medicine

## 2020-10-05 ENCOUNTER — Other Ambulatory Visit: Payer: Self-pay | Admitting: Gastroenterology

## 2020-10-05 ENCOUNTER — Ambulatory Visit (HOSPITAL_COMMUNITY): Admit: 2020-10-05 | Payer: 59 | Admitting: Internal Medicine

## 2020-10-05 ENCOUNTER — Ambulatory Visit (HOSPITAL_COMMUNITY): Payer: 59 | Admitting: Anesthesiology

## 2020-10-05 ENCOUNTER — Other Ambulatory Visit: Payer: Self-pay

## 2020-10-05 ENCOUNTER — Encounter (HOSPITAL_COMMUNITY): Payer: Self-pay

## 2020-10-05 DIAGNOSIS — Z87891 Personal history of nicotine dependence: Secondary | ICD-10-CM | POA: Diagnosis not present

## 2020-10-05 DIAGNOSIS — Z79899 Other long term (current) drug therapy: Secondary | ICD-10-CM | POA: Insufficient documentation

## 2020-10-05 DIAGNOSIS — K219 Gastro-esophageal reflux disease without esophagitis: Secondary | ICD-10-CM | POA: Insufficient documentation

## 2020-10-05 DIAGNOSIS — R1310 Dysphagia, unspecified: Secondary | ICD-10-CM | POA: Diagnosis not present

## 2020-10-05 DIAGNOSIS — R1314 Dysphagia, pharyngoesophageal phase: Secondary | ICD-10-CM | POA: Diagnosis present

## 2020-10-05 DIAGNOSIS — Z7984 Long term (current) use of oral hypoglycemic drugs: Secondary | ICD-10-CM | POA: Insufficient documentation

## 2020-10-05 DIAGNOSIS — Z91041 Radiographic dye allergy status: Secondary | ICD-10-CM | POA: Diagnosis not present

## 2020-10-05 DIAGNOSIS — Z888 Allergy status to other drugs, medicaments and biological substances status: Secondary | ICD-10-CM | POA: Insufficient documentation

## 2020-10-05 DIAGNOSIS — Z7982 Long term (current) use of aspirin: Secondary | ICD-10-CM | POA: Diagnosis not present

## 2020-10-05 DIAGNOSIS — Z79890 Hormone replacement therapy (postmenopausal): Secondary | ICD-10-CM | POA: Diagnosis not present

## 2020-10-05 DIAGNOSIS — K5904 Chronic idiopathic constipation: Secondary | ICD-10-CM

## 2020-10-05 HISTORY — PX: ESOPHAGOGASTRODUODENOSCOPY (EGD) WITH PROPOFOL: SHX5813

## 2020-10-05 HISTORY — PX: MALONEY DILATION: SHX5535

## 2020-10-05 LAB — GLUCOSE, CAPILLARY: Glucose-Capillary: 102 mg/dL — ABNORMAL HIGH (ref 70–99)

## 2020-10-05 SURGERY — ESOPHAGOGASTRODUODENOSCOPY (EGD) WITH PROPOFOL
Anesthesia: General

## 2020-10-05 SURGERY — ESOPHAGOGASTRODUODENOSCOPY (EGD) WITH PROPOFOL
Anesthesia: Monitor Anesthesia Care

## 2020-10-05 MED ORDER — LACTATED RINGERS IV SOLN
INTRAVENOUS | Status: DC
  Administered 2020-10-05: 1000 mL via INTRAVENOUS

## 2020-10-05 MED ORDER — PROPOFOL 10 MG/ML IV BOLUS
INTRAVENOUS | Status: AC
  Filled 2020-10-05: qty 60

## 2020-10-05 MED ORDER — PROPOFOL 10 MG/ML IV BOLUS
INTRAVENOUS | Status: DC | PRN
  Administered 2020-10-05: 50 mg via INTRAVENOUS
  Administered 2020-10-05: 100 mg via INTRAVENOUS
  Administered 2020-10-05 (×3): 50 mg via INTRAVENOUS

## 2020-10-05 MED ORDER — TRULANCE 3 MG PO TABS: 3.0000 mg | ORAL_TABLET | Freq: Every day | ORAL | 3 refills | Status: DC

## 2020-10-05 NOTE — Telephone Encounter (Signed)
I spoke with the pharmacist @ [facility] in [place] and was advised that the pt's cost of Amitiza is $161.06 because it is on the preferred drug list and a PA isn't needed. Even changing the Dx code does not change her cost out of pocket because her insurance has taken the biggest cost of it. Even with discount cards this is the cheapest I found.

## 2020-10-05 NOTE — Op Note (Signed)
[facility] Patient Name: Kristen [name] Procedure Date: 10/05/2020 10:54 AM MRN: [id number] Date of Birth: 1970-10-14 Attending MD: Norvel Richards , MD CSN: [id number] Age: 50 Admit Type: Outpatient Procedure:                Upper GI endoscopy Indications:              Dysphagia Providers:                Norvel Richards, MD, Jeanann Lewandowsky. Sharon Seller, RN,                            Caprice Kluver, Nelma Rothman, Technician Referring MD:              Medicines:                Propofol per Anesthesia Complications:            No immediate complications. Estimated Blood Loss:     Estimated blood loss was minimal. Procedure:                Pre-Anesthesia Assessment:                           - Prior to the procedure, a History and Physical                            was performed, and patient medications and                            allergies were reviewed. The patient's tolerance of                            previous anesthesia was also reviewed. The risks                            and benefits of the procedure and the sedation                            options and risks were discussed with the patient.                            All questions were answered, and informed consent                            was obtained. Prior Anticoagulants: The patient has                            taken no previous anticoagulant or antiplatelet                            agents. ASA Grade Assessment: III - A patient with                            severe systemic disease. After reviewing the risks  and benefits, the patient was deemed in                            satisfactory condition to undergo the procedure.                           After obtaining informed consent, the endoscope was                            passed under direct vision. Throughout the                            procedure, the patient's blood pressure, pulse, and                            oxygen  saturations were monitored continuously. The                            [id number]-[id number]) scope was introduced through the                            mouth, and advanced to the second part of duodenum.                            The upper GI endoscopy was accomplished without                            difficulty. The patient tolerated the procedure                            well. Scope In: 11:23:01 AM Scope Out: 11:30:15 AM Total Procedure Duration: 0 hours 7 minutes 14 seconds  Findings:      The examined esophagus was normal.      The entire examined stomach was normal.      The duodenal bulb and second portion of the duodenum were normal. The       scope was withdrawn. Dilation was performed with a Maloney dilator with       no resistance at 56 Fr. The dilation site was examined following       endoscope reinsertion and showed no change. Estimated blood loss was       minimal. Impression:               - Normal esophagus. Dilated.                           - Normal stomach.                           - Normal duodenal bulb and second portion of the                            duodenum.                           - No specimens collected. Moderate Sedation:  Moderate (conscious) sedation was personally administered by an       anesthesia professional. The following parameters were monitored: oxygen       saturation, heart rate, blood pressure, respiratory rate, EKG, adequacy       of pulmonary ventilation, and response to care. Recommendation:           - Patient has a contact number available for                            emergencies. The signs and symptoms of potential                            delayed complications were discussed with the                            patient. Return to normal activities tomorrow.                            Written discharge instructions were provided to the                            patient.                           - Advance diet as  tolerated.                           - Continue present medications.                           - Return to my office in 1 week. Procedure Code(s):        --- Professional ---                           [id number], Esophagogastroduodenoscopy, flexible,                            transoral; diagnostic, including collection of                            specimen(s) by brushing or washing, when performed                            (separate procedure)                           43450, Dilation of esophagus, by unguided sound or                            bougie, single or multiple passes Diagnosis Code(s):        --- Professional ---                           R13.10, Dysphagia, unspecified CPT copyright 2019 American Medical Association. All rights reserved. The codes documented in this report are preliminary and upon coder review may  be revised to meet current compliance requirements.  Cristopher Estimable. Arletta Lumadue, MD Norvel Richards, MD 10/05/2020 11:43:41 AM This report has been signed electronically. Number of Addenda: 0

## 2020-10-05 NOTE — Telephone Encounter (Signed)
Phoned to [facility] and was advised this Rx requires a step therapy. I advised them to please fax me the information so we can see what we need to do

## 2020-10-05 NOTE — Telephone Encounter (Signed)
Noted. Looks like Trulance is tier 3 and requires a PA. I can try to send this in and see if it will be more affordable. Please let patient know.

## 2020-10-05 NOTE — Telephone Encounter (Signed)
noted 

## 2020-10-05 NOTE — Transfer of Care (Signed)
Immediate Anesthesia Transfer of Care Note  Patient: Kristen [name]  Procedure(s) Performed: ESOPHAGOGASTRODUODENOSCOPY (EGD) WITH PROPOFOL [place]  Patient Location: Short Stay  Anesthesia Type:General  Level of Consciousness: awake  Airway & Oxygen Therapy: Patient Spontanous Breathing  Post-op Assessment: Report given to RN  Post vital signs: Reviewed  Last Vitals:  Vitals Value Taken Time  BP 99/70 10/05/20 1137  Temp 37 C 10/05/20 1137  Pulse    Resp 18 10/05/20 1137  SpO2 98 % 10/05/20 1137    Last Pain:  Vitals:   10/05/20 1137  TempSrc: Oral  PainSc: 0-No pain      Patients Stated Pain Goal: 5 ([id number])  Complications: No notable events documented.

## 2020-10-05 NOTE — Discharge Instructions (Signed)
EGD Discharge instructions Please read the instructions outlined below and refer to this sheet in the next few weeks. These discharge instructions provide you with general information on caring for yourself after you leave the hospital. Your doctor may also give you specific instructions. While your treatment has been planned according to the most current medical practices available, unavoidable complications occasionally occur. If you have any problems or questions after discharge, please call your doctor. ACTIVITY You may resume your regular activity but move at a slower pace for the next 24 hours.  Take frequent rest periods for the next 24 hours.  Walking will help expel (get rid of) the air and reduce the bloated feeling in your abdomen.  No driving for 24 hours (because of the anesthesia (medicine) used during the test).  You may shower.  Do not sign any important legal documents or operate any machinery for 24 hours (because of the anesthesia used during the test).  NUTRITION Drink plenty of fluids.  You may resume your normal diet.  Begin with a light meal and progress to your normal diet.  Avoid alcoholic beverages for 24 hours or as instructed by your caregiver.  MEDICATIONS You may resume your normal medications unless your caregiver tells you otherwise.  WHAT YOU CAN EXPECT TODAY You may experience abdominal discomfort such as a feeling of fullness or "gas" pains.  FOLLOW-UP Your doctor will discuss the results of your test with you.  SEEK IMMEDIATE MEDICAL ATTENTION IF ANY OF THE FOLLOWING OCCUR: Excessive nausea (feeling sick to your stomach) and/or vomiting.  Severe abdominal pain and distention (swelling).  Trouble swallowing.  Temperature over 101 F (37.8 C).  Rectal bleeding or vomiting of blood.     Your upper GI tract appeared normal today.  Your esophagus was stretched  Continue Protonix 40 mg once daily  Keep your upcoming office visit with [place] next week.  At  patient request, I called Deangel Slocumb at [phone] results and recommendations

## 2020-10-05 NOTE — H&P (Signed)
'@LOGO' @   Primary Care Physician:  Manon Hilding, MD Primary Gastroenterologist:  Dr. Gala Romney  Pre-Procedure History & Physical: HPI:  Kristen [name] is a 50 y.o. female here for further evaluation of esophageal dysphagia via EGD/esophageal dilation. Recent diverticulitis resolved.  Past Medical History:  Diagnosis Date   Allergies    Anemia    Anxiety    Arthritis    Atypical mole 11/03/2006   mid upper back (slight to moderate)   Atypical mole 11/03/2006   lower right back (slight to moderate)   Back pain    Bipolar 1 disorder (HCC)    Borderline diabetic    Chronic constipation    Constipation    Depression    Family history of adverse reaction to anesthesia    mother-- ponv   Fatty liver    GAD (generalized anxiety disorder)    GERD (gastroesophageal reflux disease)    Hiatal hernia    High triglycerides    History of kidney stones    History of recurrent UTIs    History of sepsis 06/2018   History of stomach ulcers    History of suicidal ideation    Hyperlipidemia    Hypothyroidism    IDA (iron deficiency anemia)    Joint pain    Melanoma ([place]) 11/03/2006   left chest in situ (excision)   Orthostasis    Palpitations    PONV (postoperative nausea and vomiting)    Prediabetes    Rapid heart rate    Renal calculus, left    Seasonal allergies    Shortness of breath    Sleep apnea    Sleep disorder, unspecified    per excessive sleeping during the day   Snoring    Swallowing difficulty    Tachycardia    Vertigo    Vitamin D deficiency    Weakness    Wears contact lenses     Past Surgical History:  Procedure Laterality Date   ANTERIOR CERVICAL DECOMP/DISCECTOMY FUSION  02/17/2012   Procedure: ANTERIOR CERVICAL DECOMPRESSION/DISCECTOMY FUSION 2 LEVELS;  Surgeon: Hosie Spangle, MD;  Location: [facility];  Service: Neurosurgery;  Laterality: N/A;  Cervical four-five and Cervical six-seven anterior cervial decompression with fusion plating and  bonegraft   ANTERIOR CERVICAL DECOMP/DISCECTOMY FUSION  04-01-2001   '@[facility]'    C5---6   BIOPSY  07/20/2018   Procedure: BIOPSY;  Surgeon: Daneil Dolin, MD;  Location: [facility];  Service: Endoscopy;;  gastric    BREAST REDUCTION SURGERY Bilateral 1995   [place]  x2   last one 1998   with BILATERAL TUBAL LIGATION   CESAREAN SECTION WITH BILATERAL TUBAL LIGATION     COLONOSCOPY WITH PROPOFOL N/A 07/20/2018   Dr. Gala Romney: Diverticulosis, internal hemorrhoids, 5 mm splenic flexure tubular adenoma removed. Repeat in 5 years due to family history.   CYSTOSCOPY/URETEROSCOPY/HOLMIUM LASER/STENT PLACEMENT Left 08/21/2018   Procedure: CYSTOSCOPY LEFT RETROGRADE PYELOGRAM /URETEROSCOPY/HOLMIUM LASER/STENT PLACEMENT;  Surgeon: Ceasar Mons, MD;  Location: [facility];  Service: Urology;  Laterality: Left;   DILATION AND CURETTAGE OF UTERUS  1992   for miscarriage   ENDOMETRIAL ABLATION  2014   ESOPHAGOGASTRODUODENOSCOPY     20 years ago.    ESOPHAGOGASTRODUODENOSCOPY (EGD) WITH PROPOFOL N/A 07/20/2018   Dr. Gala Romney: Small hiatal hernia, erythematous mucosa in the stomach with a healing gastric ulcer measuring 1 cm, biopsies negative.   [place]   unilateral for infertility   NASAL SINUS SURGERY  2010  approx.   POLYPECTOMY  07/20/2018   Procedure: POLYPECTOMY;  Surgeon: Daneil Dolin, MD;  [facility]: [facility];  Service: Endoscopy;;   POSTERIOR CERVICAL FUSION/FORAMINOTOMY N/A 08/18/2013   Procedure: CERVICAL FOUR TO CERVICAL SEVEN POSTERIOR CERVICAL FUSION/FORAMINOTOMY LEVEL 3;  Surgeon: Hosie Spangle, MD;  Location: [place] [facility];  Service: Neurosurgery;  Laterality: N/A;  C4-7 posterior cervical fusion with lateral mass fixation   RIGHT/LEFT HEART CATH AND CORONARY ANGIOGRAPHY N/A 12/23/2019   Procedure: RIGHT/LEFT HEART CATH AND CORONARY ANGIOGRAPHY;  Surgeon: Burnell Blanks, MD;  Location: [facility];  Service:  Cardiovascular;  Laterality: N/A;    Prior to Admission medications   Medication Sig Start Date End Date Taking? Authorizing Provider  acetaminophen (TYLENOL) 500 MG tablet Take 1,000 mg by mouth every 6 (six) hours as needed for moderate pain.   Yes [provider]  acidophilus (RISAQUAD) CAPS capsule Take by mouth daily.   Yes [provider]  ALPRAZolam (XANAX) 1 MG tablet TAKE 1 TABLET BY MOUTH THREE TIMES DAILY AS NEEDED. MAY TAKE ADDITIONAL 1/2 TABLET DURING THE DAY AS NEEDED 09/29/20  Yes Alen Blew  aspirin EC 81 MG tablet Take 1 tablet (81 mg total) by mouth daily. Swallow whole. 12/17/19  Yes Freada Bergeron, MD  b complex vitamins capsule Take 1 capsule by mouth daily.   Yes [provider]  buPROPion (WELLBUTRIN XL) 150 MG 24 hr tablet TAKE 3 TABLETS BY MOUTH ONCE DAILY IN THE MORNING 09/29/20  Yes Hurst, Teresa T, PA-C  cetirizine (ZYRTEC) 10 MG tablet Take 10 mg by mouth daily.   Yes [provider]  estradiol (VIVELLE-DOT) 0.075 MG/24HR Place 1 patch onto the skin 2 (two) times a week. 07/05/20  Yes [provider]  ezetimibe (ZETIA) 10 MG tablet Take 1 tablet (10 mg total) by mouth daily. 09/11/20  Yes Freada Bergeron, MD  fenofibrate (TRICOR) 145 MG tablet Take 1 tablet (145 mg total) by mouth daily. 02/17/20  Yes Freada Bergeron, MD  hydrocortisone (ANUSOL-HC) 2.5 % rectal cream Place 1 application rectally 2-4 times daily for rectal bleeding/burning related to hemorrhoids. Patient taking differently: Place 1 application rectally 2 (two) times daily as needed for hemorrhoids. 07/20/20  Yes Erenest Rasher, PA-C  hydrOXYzine (ATARAX/VISTARIL) 25 MG tablet Take 1 tablet (25 mg total) by mouth 3 (three) times daily as needed. 07/21/20  Yes Hurst, Dorothea Glassman, PA-C  icosapent Ethyl (VASCEPA) 1 g capsule Take 2 capsules (2 g total) by mouth 2 (two) times daily. 12/17/19  Yes Freada Bergeron, MD  lamoTRIgine (LAMICTAL) 200  MG tablet TAKE 2 & 1/2 (TWO & ONE-HALF) TABLETS BY MOUTH ONCE DAILY 09/29/20  Yes Adelene Idler, Teresa T, PA-C  levothyroxine (SYNTHROID) 75 MCG tablet Take 75 mcg by mouth daily before breakfast.  05/27/18  Yes [provider]  linaclotide (LINZESS) 290 MCG CAPS capsule Take 290 mcg by mouth daily before breakfast.   Yes [provider]  meclizine (ANTIVERT) 25 MG tablet Take 25 mg by mouth 3 (three) times daily as needed for dizziness.   Yes [provider]  metFORMIN (GLUCOPHAGE) 500 MG tablet Take 1 tablet (500 mg total) by mouth 2 (two) times daily with a meal. Patient taking differently: Take 250 mg by mouth daily as needed (blood sugar of 150 or above). 11/24/19  Yes Laqueta Linden, MD  montelukast (SINGULAIR) 10 MG tablet Take 10 mg by mouth daily.  10/26/18  Yes [provider]  ondansetron (ZOFRAN) 4 MG tablet Take 1 tablet (4 mg total) by mouth every 6 (six) hours as needed. 06/21/20  Yes Mahala Menghini, PA-C  Oxcarbazepine (TRILEPTAL) 300 MG tablet Take 1 tablet by mouth twice daily Patient taking differently: Take 600 mg by mouth at bedtime. 09/01/20  Yes [name] T, PA-C  pantoprazole (PROTONIX) 40 MG tablet Take 1 tablet (40 mg total) by mouth 2 (two) times daily with a meal. 07/11/20  Yes Annitta Needs, NP  polyethylene glycol (MIRALAX / GLYCOLAX) 17 g packet Take 17 g by mouth 2 (two) times daily.   Yes [provider]  progesterone (PROMETRIUM) 100 MG capsule Take 100 mg by mouth at bedtime. 07/04/20  Yes [provider]  rosuvastatin (CRESTOR) 5 MG tablet Take 1 tablet (5 mg total) by mouth daily. Patient taking differently: Take 5 mg by mouth at bedtime. 06/12/20  Yes Freada Bergeron, MD  sertraline (ZOLOFT) 100 MG tablet Take 2 tablets (200 mg total) by mouth at bedtime. 01/21/20  Yes Hurst, Teresa T, PA-C  zaleplon (SONATA) 10 MG capsule TAKE 1 CAPSULE BY MOUTH AT BEDTIME AS NEEDED, MAY REPEAT 1 FOR MIDNOCTURNAL AWAKENING AS  NEEDED IF THREE HOURS LEFT TO SLEEP 02/21/20  Yes Hurst, Teresa T, PA-C  albuterol (VENTOLIN HFA) 108 (90 Base) MCG/ACT inhaler Inhale 2 puffs into the lungs every 6 (six) hours as needed for wheezing or shortness of breath. Patient not taking: No sig reported 04/17/20   Spero Geralds, MD  dicyclomine (BENTYL) 10 MG capsule TAKE 1 CAPSULE BY MOUTH 4 TIMES DAILY BEFORE MEAL(S) AND AT BEDTIME FOR DIARRHEA Patient not taking: No sig reported 06/21/20   Mahala Menghini, PA-C  lubiprostone (AMITIZA) 24 MCG capsule Take 1 capsule (24 mcg total) by mouth 2 (two) times daily with a meal. Patient not taking: Reported on 09/26/2020 09/18/20   Erenest Rasher, PA-C  polyethylene glycol-electrolytes (TRILYTE) 420 g solution Take 4,000 mLs by mouth as directed. 08/15/20   Lipa Knauff, Cristopher Estimable, MD  pramipexole (MIRAPEX) 1 MG tablet Take 1 tablet (1 mg total) by mouth 2 (two) times daily. Patient not taking: No sig reported 12/20/19   [name] T, PA-C    Allergies as of 08/15/2020 - Review Complete 07/31/2020  Allergen Reaction Noted   Iohexol Hives 11/03/2007   Rosuvastatin Other (See Comments) 02/09/2020    Family History  Problem Relation Age of Onset   Asthma Mother    Rheum arthritis Mother    Non-Hodgkin's lymphoma Mother    Congestive Heart Failure Mother    Atrial fibrillation Mother    Diabetes Mother    Hyperlipidemia Mother    Cancer Mother    Colon cancer Father    Multiple myeloma Father    Hypertension Father    Cancer Father    Depression Father    Anxiety disorder Father    Alcoholism Father    Obesity Father    Allergies Other        entire family(mom and dad)   Diabetes Maternal Grandmother    Congestive Heart Failure Maternal Grandmother    Mental illness Paternal Grandmother    Colon cancer Paternal Grandfather    Bone cancer Paternal Grandfather     Social History   Socioeconomic History   Marital status: Married    Spouse name: Not on file   Number of children: 2    Years of education: Not on file   Highest  education level: Not on file  Occupational History   Occupation: disability  Tobacco Use   Smoking status: Former    Packs/day: 1.00    Years: 5.00    Pack years: 5.00    Types: Cigarettes    Quit date: 02/09/2002    Years since quitting: 18.6   Smokeless tobacco: Never  Vaping Use   Vaping Use: Never used  Substance and Sexual Activity   Alcohol use: No   Drug use: No   Sexual activity: Yes    Birth control/protection: Surgical  Other Topics Concern   Not on file  Social History Narrative   Step brother-2   Step sister-2   Half sister -1 healthy   Social Determinants of Health   Financial Resource Strain: Not on file  Food Insecurity: Not on file  Transportation Needs: Not on file  Physical Activity: Not on file  Stress: Not on file  Social Connections: Not on file  Intimate Partner Violence: Not on file    Review of Systems: See HPI, otherwise negative ROS  Physical Exam: BP (!) 143/84   Pulse 82   Temp 98.4 F (36.9 C) (Oral)   Resp 12   SpO2 96%  General:   Alert,  Well-developed, well-nourished, pleasant and cooperative in NAD SMouth:  No deformity or lesions. Neck:  Supple; no masses or thyromegaly. No significant cervical adenopathy. Lungs:  Clear throughout to auscultation.   No wheezes, crackles, or rhonchi. No acute distress. Heart:  Regular rate and rhythm; no murmurs, clicks, rubs,  or gallops. Abdomen: Non-distended, normal bowel sounds.  Soft and nontender without appreciable mass or hepatosplenomegaly.  Pulses:  Normal pulses noted. Extremities:  Without clubbing or edema.  Impression/Plan: 50 year old here with a history of GERD and esophageal dysphagia.  Here for an EGD with possible esophageal dilation as feasible/appropriate.  I have offered the patient an EGD today per plan. The risks, benefits, limitations, alternatives and imponderables have been reviewed with the patient. Potential for  esophageal dilation, biopsy, etc. have also been reviewed.  Questions have been answered. All parties agreeable.     Notice: This dictation was prepared with Dragon dictation along with smaller phrase technology. Any transcriptional errors that result from this process are unintentional and may not be corrected upon review.

## 2020-10-05 NOTE — Anesthesia Postprocedure Evaluation (Signed)
Anesthesia Post Note  Patient: Kristen [name]  Procedure(s) Performed: ESOPHAGOGASTRODUODENOSCOPY (EGD) WITH PROPOFOL [place]  Patient location during evaluation: Short Stay Anesthesia Type: General Level of consciousness: awake and alert Pain management: pain level controlled Vital Signs Assessment: post-procedure vital signs reviewed and stable Respiratory status: spontaneous breathing Cardiovascular status: blood pressure returned to baseline and stable Postop Assessment: no apparent nausea or vomiting Anesthetic complications: no   No notable events documented.   Last Vitals:  Vitals:   10/05/20 0946 10/05/20 1137  BP: (!) 143/84 99/70  Pulse: 82   Resp: 12 18  Temp: 36.9 C 37 C  SpO2: 96% 98%    Last Pain:  Vitals:   10/05/20 1137  TempSrc: Oral  PainSc: 0-No pain                 Arlyn Buerkle

## 2020-10-05 NOTE — Anesthesia Preprocedure Evaluation (Signed)
Anesthesia Evaluation  Patient identified by MRN, date of birth, ID band Patient awake    Reviewed: Allergy & Precautions, NPO status , Patient's Chart, lab work & pertinent test results  History of Anesthesia Complications (+) PONV, Family history of anesthesia reaction and history of anesthetic complications  Airway Mallampati: III  TM Distance: >3 FB Neck ROM: Full   Comment: ACDF Dental  (+) Dental Advisory Given, Teeth Intact   Pulmonary shortness of breath and with exertion, sleep apnea , former smoker,    Pulmonary exam normal breath sounds clear to auscultation       Cardiovascular Exercise Tolerance: Good Normal cardiovascular exam Rhythm:Regular Rate:Normal  The left ventricular systolic function is normal. LV end diastolic pressure is normal. The left ventricular ejection fraction is greater than 65% by visual estimate. There is no mitral valve regurgitation.   No angiographic evidence of CAD Hyperdynamic LV systolic function Normal right and left heart pressures  Recommendations: No further ischemic workup.      Neuro/Psych PSYCHIATRIC DISORDERS Anxiety Depression Bipolar Disorder    GI/Hepatic hiatal hernia, GERD  Medicated,  Endo/Other  diabetes, Well Controlled, Type 2, Oral Hypoglycemic AgentsHypothyroidism   Renal/GU Renal disease     Musculoskeletal  (+) Arthritis  (ACDF),   Abdominal   Peds  Hematology  (+) anemia ,   Anesthesia Other Findings   Reproductive/Obstetrics                             Anesthesia Physical Anesthesia Plan  ASA: 3  Anesthesia Plan: General   Post-op Pain Management:    Induction:   PONV Risk Score and Plan: Propofol infusion  Airway Management Planned: Nasal Cannula and Natural Airway  Additional Equipment:   Intra-op Plan:   Post-operative Plan:   Informed Consent: I have reviewed the patients History and Physical,  chart, labs and discussed the procedure including the risks, benefits and alternatives for the proposed anesthesia with the patient or authorized representative who has indicated his/her understanding and acceptance.     Dental advisory given  Plan Discussed with: CRNA and Surgeon  Anesthesia Plan Comments:         Anesthesia Quick Evaluation

## 2020-10-06 ENCOUNTER — Telehealth: Payer: Self-pay

## 2020-10-06 NOTE — Telephone Encounter (Signed)
PA done for Trulance. Waiting on a response

## 2020-10-06 NOTE — Telephone Encounter (Signed)
Cyril Mourning, I got more details over from the pharmacy regarding the pt's step therapy. It's Linzess or Motegrity (depending on Dx). I have the fax if you need to see it

## 2020-10-06 NOTE — Telephone Encounter (Signed)
[facility]---notice of approval for Trulance tab 3 mg. Approved through 10/06/2020. Dx: 59.04--tried/failed: Linzess and Miralax Case [id number]. Sent to scan for chart. Faxed to: [phone] ([facility])

## 2020-10-06 NOTE — Telephone Encounter (Signed)
Yes, she has already tried and failed Linzess, so she should qualify for Trulance.

## 2020-10-06 NOTE — Telephone Encounter (Signed)
Noted. Please let patient know.   When she starts Trulance, stop Linzess.

## 2020-10-06 NOTE — Telephone Encounter (Signed)
Pt approved for Trulance tab 3 mg . (Approved through 10/06/2020). Dx: 59.04, tried/fail: Linzess and Miralax

## 2020-10-09 ENCOUNTER — Other Ambulatory Visit: Payer: Self-pay | Admitting: Physician Assistant

## 2020-10-09 NOTE — Telephone Encounter (Signed)
Phoned and advised the pt to start Trulance and stop Linzess. Pt agreed. (She was advising the Rx had a $50.00 coupon she was trying to get it to go through, Rx is $75.00).

## 2020-10-10 ENCOUNTER — Ambulatory Visit: Payer: 59 | Admitting: Gastroenterology

## 2020-10-11 NOTE — Telephone Encounter (Signed)
The pt is going to stick with Linzess and Miralax and that the pharmacy is working on something for her

## 2020-10-11 NOTE — Telephone Encounter (Signed)
noted 

## 2020-10-12 ENCOUNTER — Emergency Department (HOSPITAL_COMMUNITY): Payer: 59

## 2020-10-12 ENCOUNTER — Other Ambulatory Visit (HOSPITAL_COMMUNITY): Payer: Self-pay

## 2020-10-12 ENCOUNTER — Encounter (HOSPITAL_COMMUNITY): Payer: Self-pay

## 2020-10-12 ENCOUNTER — Other Ambulatory Visit: Payer: Self-pay

## 2020-10-12 ENCOUNTER — Emergency Department (HOSPITAL_COMMUNITY)
Admission: EM | Admit: 2020-10-12 | Discharge: 2020-10-12 | Disposition: A | Discharge status: Home / Self Care | Payer: 59 | Attending: Emergency Medicine | Admitting: Emergency Medicine

## 2020-10-12 DIAGNOSIS — R197 Diarrhea, unspecified: Secondary | ICD-10-CM | POA: Diagnosis present

## 2020-10-12 DIAGNOSIS — Z79899 Other long term (current) drug therapy: Secondary | ICD-10-CM | POA: Insufficient documentation

## 2020-10-12 DIAGNOSIS — Z87891 Personal history of nicotine dependence: Secondary | ICD-10-CM | POA: Insufficient documentation

## 2020-10-12 DIAGNOSIS — A0472 Enterocolitis due to Clostridium difficile, not specified as recurrent: Secondary | ICD-10-CM | POA: Insufficient documentation

## 2020-10-12 DIAGNOSIS — E039 Hypothyroidism, unspecified: Secondary | ICD-10-CM | POA: Insufficient documentation

## 2020-10-12 DIAGNOSIS — Z7982 Long term (current) use of aspirin: Secondary | ICD-10-CM | POA: Insufficient documentation

## 2020-10-12 DIAGNOSIS — Z7984 Long term (current) use of oral hypoglycemic drugs: Secondary | ICD-10-CM | POA: Insufficient documentation

## 2020-10-12 DIAGNOSIS — E119 Type 2 diabetes mellitus without complications: Secondary | ICD-10-CM | POA: Diagnosis not present

## 2020-10-12 DIAGNOSIS — R1032 Left lower quadrant pain: Secondary | ICD-10-CM | POA: Diagnosis not present

## 2020-10-12 LAB — CBC WITH DIFFERENTIAL/PLATELET
Abs Immature Granulocytes: 0.08 K/uL — ABNORMAL HIGH (ref 0.00–0.07)
Basophils Absolute: 0.1 K/uL (ref 0.0–0.1)
Basophils Relative: 1 %
Eosinophils Absolute: 0.5 K/uL (ref 0.0–0.5)
Eosinophils Relative: 3 %
HCT: 38.7 % (ref 36.0–46.0)
Hemoglobin: 12.8 g/dL (ref 12.0–15.0)
Immature Granulocytes: 1 %
Lymphocytes Relative: 16 %
Lymphs Abs: 2.7 K/uL (ref 0.7–4.0)
MCH: 29.2 pg (ref 26.0–34.0)
MCHC: 33.1 g/dL (ref 30.0–36.0)
MCV: 88.2 fL (ref 80.0–100.0)
Monocytes Absolute: 1.3 K/uL — ABNORMAL HIGH (ref 0.1–1.0)
Monocytes Relative: 8 %
Neutro Abs: 12.2 K/uL — ABNORMAL HIGH (ref 1.7–7.7)
Neutrophils Relative %: 71 %
Platelets: 452 K/uL — ABNORMAL HIGH (ref 150–400)
RBC: 4.39 MIL/uL (ref 3.87–5.11)
RDW: 13.8 % (ref 11.5–15.5)
WBC: 16.9 K/uL — ABNORMAL HIGH (ref 4.0–10.5)
nRBC: 0 % (ref 0.0–0.2)

## 2020-10-12 LAB — C DIFFICILE QUICK SCREEN W PCR REFLEX
C Diff antigen: POSITIVE — AB
C Diff toxin: NEGATIVE

## 2020-10-12 LAB — BASIC METABOLIC PANEL WITH GFR
Anion gap: 6 (ref 5–15)
BUN: 17 mg/dL (ref 6–20)
CO2: 25 mmol/L (ref 22–32)
Calcium: 9.1 mg/dL (ref 8.9–10.3)
Chloride: 107 mmol/L (ref 98–111)
Creatinine, Ser: 0.88 mg/dL (ref 0.44–1.00)
GFR, Estimated: >60 mL/min
Glucose, Bld: 123 mg/dL — ABNORMAL HIGH (ref 70–99)
Potassium: 3.8 mmol/L (ref 3.5–5.1)
Sodium: 138 mmol/L (ref 135–145)

## 2020-10-12 LAB — CLOSTRIDIUM DIFFICILE BY PCR, REFLEXED: Toxigenic C. Difficile by PCR: POSITIVE — AB

## 2020-10-12 MED ORDER — LOPERAMIDE HCL 2 MG PO CAPS: 2.0000 mg | ORAL_CAPSULE | Freq: Four times a day (QID) | ORAL | 0 refills | Status: DC | PRN

## 2020-10-12 MED ORDER — MORPHINE SULFATE (PF) 4 MG/ML IV SOLN
6.0000 mg | Freq: Once | INTRAVENOUS | Status: AC | PRN
  Administered 2020-10-12: 6 mg via INTRAVENOUS
  Filled 2020-10-12: qty 2

## 2020-10-12 MED ORDER — FIDAXOMICIN 200 MG PO TABS
200.0000 mg | ORAL_TABLET | Freq: Two times a day (BID) | ORAL | Status: DC
  Administered 2020-10-12: 200 mg via ORAL
  Filled 2020-10-12 (×3): qty 1

## 2020-10-12 MED ORDER — LACTATED RINGERS IV BOLUS
1000.0000 mL | Freq: Once | INTRAVENOUS | Status: AC
  Administered 2020-10-12: 1000 mL via INTRAVENOUS

## 2020-10-12 MED ORDER — FIDAXOMICIN 200 MG PO TABS: 200.0000 mg | ORAL_TABLET | Freq: Two times a day (BID) | ORAL | 0 refills | Status: DC

## 2020-10-12 MED ORDER — BARIUM SULFATE 2.1 % PO SUSP
ORAL | Status: AC
  Filled 2020-10-12: qty 2

## 2020-10-12 MED ORDER — ONDANSETRON HCL 4 MG/2ML IJ SOLN
4.0000 mg | Freq: Once | INTRAMUSCULAR | Status: AC
  Administered 2020-10-12: 4 mg via INTRAVENOUS
  Filled 2020-10-12: qty 2

## 2020-10-12 MED ORDER — SODIUM CHLORIDE 0.9 % IV BOLUS
1000.0000 mL | Freq: Once | INTRAVENOUS | Status: AC
  Administered 2020-10-12: 1000 mL via INTRAVENOUS

## 2020-10-12 MED ORDER — LOPERAMIDE HCL 2 MG PO CAPS
4.0000 mg | ORAL_CAPSULE | Freq: Once | ORAL | Status: AC
  Administered 2020-10-12: 4 mg via ORAL
  Filled 2020-10-12: qty 2

## 2020-10-12 NOTE — Discharge Instructions (Addendum)
As discussed, your work-up here reveals C. Difficile colitis.  The treatment of this is with antibiotic that has been prescribed for 10 days.  Please complete the course of the antibiotics even if you get better.  The diarrhea medication should be taken if you are having high output diarrhea and for nighttime symptoms.  You can take Imodium every 2 hours if needed, for up to 5 total pills in a day.   See your primary care doctor soon as possible. Return to the ER if your diarrhea gets worse, striving bloody stools, your feeling dizzy, lightheaded.

## 2020-10-12 NOTE — ED Provider Notes (Signed)
[place] Provider Note   CSN: [id number] Arrival date & time: 10/12/20  1751     History Chief Complaint  Patient presents with  . Abdominal Pain    Kristen [name] is a 50 y.o. female.  HPI     50 year old female comes in with chief complaint of abdominal pain.  Patient has history of diverticulitis, status post Cipro and Augmentin in the last 3 months.  She reports that yesterday evening she started having severe nausea with abdominal pain and diarrhea.  Pain is located in the left lower quadrant primarily, and it is similar to her diverticulitis pain.  However, with her diverticulitis she has not had severe nausea or diarrhea.  Typically she is more constipated.  Overnight patient had 10 episodes of loose, foul-smelling BM.  Past Medical History:  Diagnosis Date  . Allergies   . Anemia   . Anxiety   . Arthritis   . Atypical mole 11/03/2006   mid upper back (slight to moderate)  . Atypical mole 11/03/2006   lower right back (slight to moderate)  . Back pain   . Bipolar 1 disorder ([place])   . Borderline diabetic   . Chronic constipation   . Constipation   . Depression   . Family history of adverse reaction to anesthesia    mother-- ponv  . Fatty liver   . GAD (generalized anxiety disorder)   . GERD (gastroesophageal reflux disease)   . Hiatal hernia   . High triglycerides   . History of kidney stones   . History of recurrent UTIs   . History of sepsis 06/2018  . History of stomach ulcers   . History of suicidal ideation   . Hyperlipidemia   . Hypothyroidism   . IDA (iron deficiency anemia)   . Joint pain   . Melanoma ([place]) 11/03/2006   left chest in situ (excision)  . Orthostasis   . Palpitations   . PONV (postoperative nausea and vomiting)   . Prediabetes   . Rapid heart rate   . Renal calculus, left   . Seasonal allergies   . Shortness of breath   . Sleep apnea   . Sleep disorder, unspecified    per excessive sleeping during  the day  . Snoring   . Swallowing difficulty   . Tachycardia   . Vertigo   . Vitamin D deficiency   . Weakness   . Wears contact lenses     Patient Active Problem List   Diagnosis Date Noted  . Periumbilical abdominal pain 07/31/2020  . Change in bowel habits 07/31/2020  . Hypertriglyceridemia 01/10/2020  . Shortness of breath   . Leukocytosis 06/10/2019  . Nausea without vomiting 04/19/2019  . Dysphagia 02/08/2019  . Hoarseness 02/08/2019  . Chronic constipation   . GERD (gastroesophageal reflux disease)   . Bloating   . Family history of colon cancer in father   . Dysphonia 11/19/2018  . Laryngopharyngeal reflux (LPR) 11/19/2018  . Anemia   . AKI (acute kidney injury) ([place]) 07/10/2018  . Lithium toxicity 07/10/2018  . Encephalopathy 07/10/2018  . Acute urinary retention 07/10/2018  . 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 GAD (generalized anxiety disorder) 12/05/2017  . Tremor 12/05/2017  . Insomnia 12/05/2017  . Bipolar I disorder ([place]) 12/05/2017  . Obesity, Class II, BMI 35-39.9, with comorbidity 12/06/2015  . Anxiety 11/15/2015  . Fatty liver 11/15/2015  . History of kidney stones 11/15/2015  . Other hyperlipidemia 11/15/2015  .  Type 2 diabetes mellitus without complication ([place]) 19/14/7829  . Pseudoarthrosis of cervical spine ([place]) 08/18/2013  . Status post cervical spinal arthrodesis 08/18/2013  . Sleep disorder 07/20/2012  . Chronic rhinitis 07/20/2012    Past Surgical History:  Procedure Laterality Date  . ANTERIOR CERVICAL DECOMP/DISCECTOMY FUSION  02/17/2012   Procedure: ANTERIOR CERVICAL DECOMPRESSION/DISCECTOMY FUSION 2 LEVELS;  Surgeon: Hosie Spangle, MD;  Location: [facility];  Service: Neurosurgery;  Laterality: N/A;  Cervical four-five and Cervical six-seven anterior cervial decompression with fusion plating and bonegraft  . ANTERIOR CERVICAL DECOMP/DISCECTOMY FUSION  04-01-2001   '@[facility]'    C5---6  . BIOPSY  07/20/2018   Procedure:  BIOPSY;  Surgeon: Daneil Dolin, MD;  [facility]: [facility];  Service: Endoscopy;;  gastric   . BREAST REDUCTION SURGERY Bilateral 1995  . CESAREAN SECTION  x2   last one 1998   with BILATERAL TUBAL LIGATION  . CESAREAN SECTION WITH BILATERAL TUBAL LIGATION    . COLONOSCOPY WITH PROPOFOL N/A 07/20/2018   Dr. Gala Romney: Diverticulosis, internal hemorrhoids, 5 mm splenic flexure tubular adenoma removed. Repeat in 5 years due to family history.  . CYSTOSCOPY/URETEROSCOPY/HOLMIUM LASER/STENT PLACEMENT Left 08/21/2018   Procedure: CYSTOSCOPY LEFT RETROGRADE PYELOGRAM /URETEROSCOPY/HOLMIUM LASER/STENT PLACEMENT;  Surgeon: Ceasar Mons, MD;  Location: [facility];  Service: Urology;  Laterality: Left;  . DILATION AND CURETTAGE OF UTERUS  1992   for miscarriage  . ENDOMETRIAL ABLATION  2014  . ESOPHAGOGASTRODUODENOSCOPY     20 years ago.   Marland Kitchen ESOPHAGOGASTRODUODENOSCOPY (EGD) WITH PROPOFOL N/A 07/20/2018   Dr. Gala Romney: Small hiatal hernia, erythematous mucosa in the stomach with a healing gastric ulcer measuring 1 cm, biopsies negative.  Marland Kitchen [place]   unilateral for infertility  . NASAL SINUS SURGERY  2010  approx.  Marland Kitchen POLYPECTOMY  07/20/2018   Procedure: POLYPECTOMY;  Surgeon: Daneil Dolin, MD;  [facility];  Service: Endoscopy;;  . POSTERIOR CERVICAL FUSION/FORAMINOTOMY N/A 08/18/2013   Procedure: CERVICAL FOUR TO CERVICAL SEVEN POSTERIOR CERVICAL FUSION/FORAMINOTOMY LEVEL 3;  Surgeon: Hosie Spangle, MD;  Location: [facility];  Service: Neurosurgery;  Laterality: N/A;  C4-7 posterior cervical fusion with lateral mass fixation  . RIGHT/LEFT HEART CATH AND CORONARY ANGIOGRAPHY N/A 12/23/2019   Procedure: RIGHT/LEFT HEART CATH AND CORONARY ANGIOGRAPHY;  Surgeon: Burnell Blanks, MD;  Location: [facility];  Service: Cardiovascular;  Laterality: N/A;     OB History     Gravida  3   Para      Term      Preterm      AB       Living         SAB      IAB      Ectopic      Multiple      Live Births              Family History  Problem Relation Age of Onset  . Asthma Mother   . Rheum arthritis Mother   . Non-Hodgkin's lymphoma Mother   . Congestive Heart Failure Mother   . Atrial fibrillation Mother   . Diabetes Mother   . Hyperlipidemia Mother   . Cancer Mother   . Colon cancer Father   . Multiple myeloma Father   . Hypertension Father   . Cancer Father   . Depression Father   . Anxiety disorder Father   . Alcoholism Father   . Obesity Father   .  Allergies Other        entire family(mom and dad)  . Diabetes Maternal Grandmother   . Congestive Heart Failure Maternal Grandmother   . Mental illness Paternal Grandmother   . Colon cancer Paternal Grandfather   . Bone cancer Paternal Grandfather     Social History   Tobacco Use  . Smoking status: Former    Packs/day: 1.00    Years: 5.00    Pack years: 5.00    Types: Cigarettes    Quit date: 02/09/2002    Years since quitting: 18.6  . Smokeless tobacco: Never  Vaping Use  . Vaping Use: Never used  Substance Use Topics  . Alcohol use: No  . Drug use: No    Home Medications Prior to Admission medications   Medication Sig Start Date End Date Taking? Authorizing Provider  fidaxomicin (DIFICID) 200 MG TABS tablet Take 1 tablet (200 mg total) by mouth 2 (two) times daily. 10/12/20  Yes Varney Biles, MD  loperamide (IMODIUM) 2 MG capsule Take 1 capsule (2 mg total) by mouth 4 (four) times daily as needed for diarrhea or loose stools. 10/12/20  Yes Varney Biles, MD  acetaminophen (TYLENOL) 500 MG tablet Take 1,000 mg by mouth every 6 (six) hours as needed for moderate pain.    [provider]  acidophilus (RISAQUAD) CAPS capsule Take by mouth daily.    [provider]  ALPRAZolam (XANAX) 1 MG tablet TAKE 1 TABLET BY MOUTH THREE TIMES DAILY AS NEEDED. MAY TAKE ADDITIONAL 1/2 TABLET DURING THE DAY AS NEEDED  09/29/20   Addison Lank, PA-C  aspirin EC 81 MG tablet Take 1 tablet (81 mg total) by mouth daily. Swallow whole. 12/17/19   Freada Bergeron, MD  b complex vitamins capsule Take 1 capsule by mouth daily.    [provider]  buPROPion (WELLBUTRIN XL) 150 MG 24 hr tablet TAKE 3 TABLETS BY MOUTH ONCE DAILY IN THE MORNING 09/29/20   [name] T, PA-C  cetirizine (ZYRTEC) 10 MG tablet Take 10 mg by mouth daily.    [provider]  estradiol (VIVELLE-DOT) 0.075 MG/24HR Place 1 patch onto the skin 2 (two) times a week. 07/05/20   [provider]  ezetimibe (ZETIA) 10 MG tablet Take 1 tablet (10 mg total) by mouth daily. 09/11/20   Freada Bergeron, MD  fenofibrate (TRICOR) 145 MG tablet Take 1 tablet (145 mg total) by mouth daily. 02/17/20   Freada Bergeron, MD  hydrocortisone (ANUSOL-HC) 2.5 % rectal cream Place 1 application rectally 2-4 times daily for rectal bleeding/burning related to hemorrhoids. Patient taking differently: Place 1 application rectally 2 (two) times daily as needed for hemorrhoids. 07/20/20   Erenest Rasher, PA-C  hydrOXYzine (ATARAX/VISTARIL) 25 MG tablet Take 1 tablet by mouth three times daily as needed 10/10/20   [name] T, PA-C  icosapent Ethyl (VASCEPA) 1 g capsule Take 2 capsules (2 g total) by mouth 2 (two) times daily. 12/17/19   Freada Bergeron, MD  lamoTRIgine (LAMICTAL) 200 MG tablet TAKE 2 & 1/2 (TWO & ONE-HALF) TABLETS BY MOUTH ONCE DAILY 09/29/20   [name] T, PA-C  levothyroxine (SYNTHROID) 75 MCG tablet Take 75 mcg by mouth daily before breakfast.  05/27/18   [provider]  linaclotide (LINZESS) 290 MCG CAPS capsule Take 290 mcg by mouth daily before breakfast.    [provider]  meclizine (ANTIVERT) 25 MG tablet Take 25 mg by mouth 3 (three) times daily as  needed for dizziness.    [provider]  metFORMIN (GLUCOPHAGE) 500 MG tablet Take 1 tablet (500 mg total) by mouth 2 (two) times  daily with a meal. Patient taking differently: Take 250 mg by mouth daily as needed (blood sugar of 150 or above). 11/24/19   Laqueta Linden, MD  montelukast (SINGULAIR) 10 MG tablet Take 10 mg by mouth daily.  10/26/18   [provider]  ondansetron (ZOFRAN) 4 MG tablet Take 1 tablet (4 mg total) by mouth every 6 (six) hours as needed. 06/21/20   Mahala Menghini, PA-C  Oxcarbazepine (TRILEPTAL) 300 MG tablet Take 1 tablet by mouth twice daily Patient taking differently: Take 600 mg by mouth at bedtime. 09/01/20   Addison Lank, PA-C  pantoprazole (PROTONIX) 40 MG tablet Take 1 tablet (40 mg total) by mouth 2 (two) times daily with a meal. 07/11/20   Annitta Needs, NP  Plecanatide (TRULANCE) 3 MG TABS Take 3 mg by mouth daily. 10/05/20   Erenest Rasher, PA-C  polyethylene glycol (MIRALAX / GLYCOLAX) 17 g packet Take 17 g by mouth 2 (two) times daily.    [provider]  polyethylene glycol-electrolytes (TRILYTE) 420 g solution Take 4,000 mLs by mouth as directed. 08/15/20   Rourk, Cristopher Estimable, MD  progesterone (PROMETRIUM) 100 MG capsule Take 100 mg by mouth at bedtime. 07/04/20   [provider]  rosuvastatin (CRESTOR) 5 MG tablet Take 1 tablet (5 mg total) by mouth daily. Patient taking differently: Take 5 mg by mouth at bedtime. 06/12/20   Freada Bergeron, MD  sertraline (ZOLOFT) 100 MG tablet Take 2 tablets (200 mg total) by mouth at bedtime. 01/21/20   [name] T, PA-C  zaleplon (SONATA) 10 MG capsule TAKE 1 CAPSULE BY MOUTH AT BEDTIME AS NEEDED, MAY REPEAT 1 FOR MIDNOCTURNAL AWAKENING AS NEEDED IF THREE HOURS LEFT TO SLEEP 02/21/20   [name] T, PA-C    Allergies    Iohexol and Rosuvastatin  Review of Systems   Review of Systems  Constitutional:  Positive for activity change.  Gastrointestinal:  Positive for abdominal pain, diarrhea and nausea.  All other systems reviewed and are negative.  Physical Exam Updated Vital Signs BP (!) 112/57 (BP  Location: Left Arm)   Pulse 79   Temp 98.3 F (36.8 C) (Oral)   Resp 18   Ht '5\' 6"'  (1.676 m)   Wt 105.2 kg   SpO2 94%   BMI 37.45 kg/m   Physical Exam Vitals and nursing note reviewed.  Constitutional:      Appearance: She is well-developed.  HENT:     Head: Atraumatic.  Cardiovascular:     Rate and Rhythm: Normal rate.  Pulmonary:     Effort: Pulmonary effort is normal.  Abdominal:     Tenderness: There is abdominal tenderness in the left lower quadrant. There is no guarding or rebound.  Musculoskeletal:     Cervical back: Normal range of motion and neck supple.  Skin:    General: Skin is warm and dry.  Neurological:     Mental Status: She is alert and oriented to person, place, and time.    ED Results / Procedures / Treatments   Labs (all labs ordered are listed, but only abnormal results are displayed) Labs Reviewed  C DIFFICILE QUICK SCREEN W PCR REFLEX   - Abnormal; Notable for the following components:      Result Value   C Diff antigen POSITIVE (*)  All other components within normal limits  CBC WITH DIFFERENTIAL/PLATELET - Abnormal; Notable for the following components:   WBC 16.9 (*)    Platelets 452 (*)    Neutro Abs 12.2 (*)    Monocytes Absolute 1.3 (*)    Abs Immature Granulocytes 0.08 (*)    All other components within normal limits  BASIC METABOLIC PANEL - Abnormal; Notable for the following components:   Glucose, Bld 123 (*)    All other components within normal limits  GASTROINTESTINAL PANEL BY PCR, STOOL (REPLACES STOOL CULTURE)  CLOSTRIDIUM DIFFICILE BY PCR, REFLEXED    EKG None  Radiology CT ABDOMEN PELVIS WO CONTRAST  Result Date: 10/12/2020 CLINICAL DATA:  Mild left-sided abdominal pain, nausea, diarrhea EXAM: CT ABDOMEN AND PELVIS WITHOUT CONTRAST TECHNIQUE: Multidetector CT imaging of the abdomen and pelvis was performed following the standard protocol without IV contrast. COMPARISON:  CT abdomen/pelvis 08/07/2020 FINDINGS: Lower  chest: The lung bases are clear. The imaged heart is unremarkable. Hepatobiliary: The liver is diffusely hypoattenuating consistent with fatty infiltration. There are no focal lesions, within the confines of noncontrast technique. The gallbladder is unremarkable. There is no biliary ductal dilatation. Pancreas: Unremarkable Spleen: Unremarkable. Adrenals/Urinary Tract: The adrenals are unremarkable. The kidneys are normal, with no focal lesion, stone, hydronephrosis, or hydroureter. The bladder is unremarkable. Stomach/Bowel: The stomach is unremarkable. There is no evidence of bowel obstruction. There is no abnormal bowel wall thickening or inflammatory change. There are scattered colonic diverticula without evidence of acute diverticulitis. The appendix is normal. Vascular/Lymphatic: The abdominal aorta is nonaneurysmal. There is no abdominal or pelvic lymphadenopathy. Reproductive: The uterus is enlarged likely reflecting fibroids. There is a 2.2 cm exophytic fibroid arising from the posterior uterine wall, unchanged. There is no adnexal mass. Other: There is no ascites or free air. Musculoskeletal: There is no acute osseous abnormality or aggressive osseous lesion. IMPRESSION: 1. No acute finding in the abdomen or pelvis. 2. Diverticulosis without evidence of acute diverticulitis. 3. Fibroid uterus. 4. Hepatic steatosis. Electronically Signed   By: Valetta Mole M.D.   On: 10/12/2020 11:04    Procedures Procedures   Medications Ordered in ED Medications  morphine 4 MG/ML injection 6 mg (has no administration in time range)  Barium Sulfate 2.1 % SUSP (has no administration in time range)  lactated ringers bolus 1,000 mL (has no administration in time range)  fidaxomicin (DIFICID) tablet 200 mg (has no administration in time range)  loperamide (IMODIUM) capsule 4 mg (has no administration in time range)  ondansetron (ZOFRAN) injection 4 mg (4 mg Intravenous Given 10/12/20 0835)  sodium chloride 0.9 %  bolus 1,000 mL (1,000 mLs Intravenous New Bag/Given 10/12/20 0836)    ED Course  I have reviewed the triage vital signs and the nursing notes.  Pertinent labs & imaging results that were available during my care of the patient were reviewed by me and considered in my medical decision making (see chart for details).  Clinical Course as of 10/12/20 1226  Thu Oct 12, 2020  1123 C Diff antigen(!): POSITIVE C. difficile antigen is positive, toxin is negative.  CT scan does not reveal acute diverticulitis.  Results of the ER work-up discussed with the patient.  She is having profound diarrhea, therefore I suspect we will need to start her on some treatment.  Discussing case with infectious disease team to come up with a plan on the right antibiotic regimen given the C. difficile toxin is negative. [AN]  1224 ID recommends fidoxomicin. Results  have been discussed with the patient and her husband. [AN]    Clinical Course User Index [AN] Varney Biles, MD   MDM Rules/Calculators/A&P                           50 year old comes in with chief complaint of abdominal pain. Patient has history of diverticulitis and has been on 2 rounds of antibiotics since June.  She comes in with left lower quadrant pain that is similar to her diverticulitis pain.  However, typically she has constipation and diarrhea.  She is also having some nausea.  I am concerned that she could have C. difficile given her recent antibiotic usage.  I discussed with the patient the option of treating diverticulitis without antibiotics, with wait and watch approach -and returning to the ER if she starts having worsening pain, fevers, chills.  We discussed that in [place] and newer literature is supportive of conservative approach without antibiotics, and this way we can avoid CT scan and increasing her chance of C. difficile colitis.  She however is concerned that she has diverticulitis and since her mother passed away from it, she  wants to get antibiotics if she has it.  Therefore, we will get a CT scan because if it does show diverticulitis and we will start her on antibiotics and if it shows colitis, then we will tailor our therapy away from diverticulitis.   Final Clinical Impression(s) / ED Diagnoses Final diagnoses:  C. difficile colitis    Rx / DC Orders ED Discharge Orders          Ordered    fidaxomicin (DIFICID) 200 MG TABS tablet  2 times daily        10/12/20 1223    loperamide (IMODIUM) 2 MG capsule  4 times daily PRN        10/12/20 1223             Varney Biles, MD 10/12/20 1226

## 2020-10-12 NOTE — ED Notes (Signed)
Pt had a liquid BM, sample sent to lab.  Pt continues to drink contrast.

## 2020-10-12 NOTE — ED Triage Notes (Signed)
Pt nauseas, smelly feces, watery yellow in color, pain in left lower abd. Pain 5/10. Pt alert and oriented, skin warm and dry.

## 2020-10-12 NOTE — ED Notes (Signed)
Pt to CT

## 2020-10-13 ENCOUNTER — Encounter (HOSPITAL_COMMUNITY): Payer: Self-pay | Admitting: Internal Medicine

## 2020-10-13 LAB — GASTROINTESTINAL PANEL BY PCR, STOOL (REPLACES STOOL CULTURE)

## 2020-10-18 ENCOUNTER — Other Ambulatory Visit: Payer: Self-pay

## 2020-10-18 ENCOUNTER — Ambulatory Visit (INDEPENDENT_AMBULATORY_CARE_PROVIDER_SITE_OTHER): Payer: 59 | Admitting: Physician Assistant

## 2020-10-18 ENCOUNTER — Telehealth: Payer: Self-pay | Admitting: Gastroenterology

## 2020-10-18 ENCOUNTER — Encounter: Payer: Self-pay | Admitting: Physician Assistant

## 2020-10-18 DIAGNOSIS — F99 Mental disorder, not otherwise specified: Secondary | ICD-10-CM

## 2020-10-18 DIAGNOSIS — Z6379 Other stressful life events affecting family and household: Secondary | ICD-10-CM

## 2020-10-18 DIAGNOSIS — F411 Generalized anxiety disorder: Secondary | ICD-10-CM | POA: Diagnosis not present

## 2020-10-18 DIAGNOSIS — Z79899 Other long term (current) drug therapy: Secondary | ICD-10-CM

## 2020-10-18 DIAGNOSIS — F319 Bipolar disorder, unspecified: Secondary | ICD-10-CM

## 2020-10-18 DIAGNOSIS — F5105 Insomnia due to other mental disorder: Secondary | ICD-10-CM

## 2020-10-18 DIAGNOSIS — R454 Irritability and anger: Secondary | ICD-10-CM

## 2020-10-18 MED ORDER — CLONAZEPAM 1 MG PO TABS: ORAL_TABLET | ORAL | 0 refills | Status: DC

## 2020-10-18 MED ORDER — OXCARBAZEPINE 300 MG PO TABS: ORAL_TABLET | ORAL | 2 refills | Status: DC

## 2020-10-18 NOTE — Progress Notes (Signed)
[facility]  Patient ID: Kristen [name],  MRN: [id number]  PCP: Manon Hilding, MD  Date of Evaluation: 09/17/2020  Time spent:40 minutes  Chief Complaint:  Chief Complaint   Follow-up; Anxiety; Depression; Insomnia     HISTORY/CURRENT STATUS: HPI For routine med check.  "I am over the top with anxiety and stress. My husband just got out of [place], can't work b/c of seizures. I have to do everything. I have to drive him everywhere. I'm so tired. I have to worry about the bills. My son moved back home. My other son in [place] is not being deployed b/c his platelets were abnormal. And I'm worrying about that. My step-daddy is riding around with his new GF and he won't let me go in my mama's house." PA daily. "Popping Xanax all the time and it don't work. Hydroxyzine doesn't help at all. I have to give myself pedicures b/c we are tapped out. That's how bad it's gotten."  Feels that Bipolar Disorder is controlled with current meds. Feels numb.  Not really depressed.  She does not cry at all.  But does not have time to enjoy anything.  Energy and motivation are low but because she is not getting very much sleep at all, she has to make sure her husband is safe, he has a seizure every night so she has not been sleeping well.  She is looking forward to going to [place] in early November to be with her son and his girlfriend at the birth of their daughter.  She denies any suicidal or homicidal thoughts.  States she cannot be manic because she does not have any money to be manic with.  Patient denies increased energy with decreased need for sleep, no increased talkativeness, no racing thoughts, no impulsivity or risky behaviors, no increased spending, no increased libido, no grandiosity, no increased irritability or anger, no paranoia.  And no hallucinations.  Review of Systems  Constitutional:  Positive for malaise/fatigue.  HENT: Negative.    Eyes: Negative.   Respiratory: Negative.     Cardiovascular: Negative.   Gastrointestinal:  Positive for abdominal pain, diarrhea and heartburn.  Genitourinary: Negative.   Musculoskeletal: Negative.   Skin: Negative.   Endo/Heme/Allergies: Negative.   Psychiatric/Behavioral:  The patient is nervous/anxious.        See HPI    Individual Medical History/ Review of Systems: Changes? :Yes   hosp last week for C Diff. Has had diverticulitis. Chronic abdominal pain.   Past medications for mental health diagnoses include: Prozac, Paxil, Lexapro, Celexa, Zoloft, Viibryd, Effexor, Cymbalta, Risperdal, Latuda, Lamictal, VPA, Lithium-became toxic, Xanax, Wellbutrin, Abilify, Willette Al[place], Strattera, Kapvay, Saphris, Adderall, Vyvanse, Lunesta, Sonata,  Vraylar, Seroquel, Geodon, Tegretal  Allergies: Iohexol and Rosuvastatin  Current Medications:  Current Outpatient Medications:    acetaminophen (TYLENOL) 500 MG tablet, Take 1,000 mg by mouth every 6 (six) hours as needed for moderate pain., Disp: , Rfl:    acidophilus (RISAQUAD) CAPS capsule, Take by mouth daily., Disp: , Rfl:    ALPRAZolam (XANAX) 1 MG tablet, TAKE 1 TABLET BY MOUTH THREE TIMES DAILY AS NEEDED. MAY TAKE ADDITIONAL 1/2 TABLET DURING THE DAY AS NEEDED, Disp: 135 tablet, Rfl: 0   aspirin EC 81 MG tablet, Take 1 tablet (81 mg total) by mouth daily. Swallow whole., Disp: 30 tablet, Rfl: 11   b complex vitamins capsule, Take 1 capsule by mouth daily., Disp: , Rfl:    buPROPion (WELLBUTRIN XL) 150 MG 24 hr tablet, TAKE 3  TABLETS BY MOUTH ONCE DAILY IN THE MORNING, Disp: 90 tablet, Rfl: 0   cetirizine (ZYRTEC) 10 MG tablet, Take 10 mg by mouth daily., Disp: , Rfl:    clonazePAM (KLONOPIN) 1 MG tablet, 1 p.o. 3 times daily as needed anxiety and may repeat 1/2 pill during the middle of the day as needed anxiety.  No more than 3.5 mg/day., Disp: 135 tablet, Rfl: 0   estradiol (VIVELLE-DOT) 0.075 MG/24HR, Place 1 patch onto the skin 2 (two) times a week., Disp: , Rfl:    ezetimibe (ZETIA) 10  MG tablet, Take 1 tablet (10 mg total) by mouth daily., Disp: 90 tablet, Rfl: 0   fenofibrate (TRICOR) 145 MG tablet, Take 1 tablet (145 mg total) by mouth daily., Disp: 30 tablet, Rfl: 11   fidaxomicin (DIFICID) 200 MG TABS tablet, Take 1 tablet (200 mg total) by mouth 2 (two) times daily., Disp: 19 tablet, Rfl: 0   hydrOXYzine (ATARAX/VISTARIL) 25 MG tablet, Take 1 tablet by mouth three times daily as needed, Disp: 90 tablet, Rfl: 0   icosapent Ethyl (VASCEPA) 1 g capsule, Take 2 capsules (2 g total) by mouth 2 (two) times daily., Disp: 360 capsule, Rfl: 3   lamoTRIgine (LAMICTAL) 200 MG tablet, TAKE 2 & 1/2 (TWO & ONE-HALF) TABLETS BY MOUTH ONCE DAILY, Disp: 75 tablet, Rfl: 0   levothyroxine (SYNTHROID) 75 MCG tablet, Take 75 mcg by mouth daily before breakfast. , Disp: , Rfl:    linaclotide (LINZESS) 290 MCG CAPS capsule, Take 290 mcg by mouth daily before breakfast., Disp: , Rfl:    loperamide (IMODIUM) 2 MG capsule, Take 1 capsule (2 mg total) by mouth 4 (four) times daily as needed for diarrhea or loose stools., Disp: 20 capsule, Rfl: 0   meclizine (ANTIVERT) 25 MG tablet, Take 25 mg by mouth 3 (three) times daily as needed for dizziness., Disp: , Rfl:    metFORMIN (GLUCOPHAGE) 500 MG tablet, Take 1 tablet (500 mg total) by mouth 2 (two) times daily with a meal. (Patient taking differently: Take 250 mg by mouth daily as needed (blood sugar of 150 or above).), Disp: 60 tablet, Rfl: 0   montelukast (SINGULAIR) 10 MG tablet, Take 10 mg by mouth daily. , Disp: , Rfl:    ondansetron (ZOFRAN) 4 MG tablet, Take 1 tablet (4 mg total) by mouth every 6 (six) hours as needed., Disp: 30 tablet, Rfl: 3   pantoprazole (PROTONIX) 40 MG tablet, Take 1 tablet (40 mg total) by mouth 2 (two) times daily with a meal., Disp: 180 tablet, Rfl: 3   Plecanatide (TRULANCE) 3 MG TABS, Take 3 mg by mouth daily., Disp: 30 tablet, Rfl: 3   polyethylene glycol (MIRALAX / GLYCOLAX) 17 g packet, Take 17 g by mouth 2 (two)  times daily., Disp: , Rfl:    polyethylene glycol-electrolytes (TRILYTE) 420 g solution, Take 4,000 mLs by mouth as directed., Disp: 4000 mL, Rfl: 0   progesterone (PROMETRIUM) 100 MG capsule, Take 100 mg by mouth at bedtime., Disp: , Rfl:    rosuvastatin (CRESTOR) 5 MG tablet, Take 1 tablet (5 mg total) by mouth daily. (Patient taking differently: Take 5 mg by mouth at bedtime.), Disp: 90 tablet, Rfl: 3   sertraline (ZOLOFT) 100 MG tablet, Take 2 tablets (200 mg total) by mouth at bedtime., Disp: 60 tablet, Rfl: 11   zaleplon (SONATA) 10 MG capsule, TAKE 1 CAPSULE BY MOUTH AT BEDTIME AS NEEDED, MAY REPEAT 1 FOR MIDNOCTURNAL AWAKENING AS NEEDED IF THREE HOURS  LEFT TO SLEEP, Disp: 60 capsule, Rfl: 5   hydrocortisone (ANUSOL-HC) 2.5 % rectal cream, Place 1 application rectally 2-4 times daily for rectal bleeding/burning related to hemorrhoids. (Patient not taking: Reported on 10/18/2020), Disp: 30 g, Rfl: 1   Oxcarbazepine (TRILEPTAL) 300 MG tablet, 1 po q am, 2 po qhs, Disp: 90 tablet, Rfl: 2 Medication Side Effects: none  Family Medical/ Social History: Changes?  husband has psychogenic nonepileptic seizures.   MENTAL HEALTH EXAM:  There were no vitals taken for this visit.There is no height or weight on file to calculate BMI.  General Appearance: Casual, Neat, Well Groomed and Obese  Eye Contact:  Good  Speech:  Clear and Coherent and Normal Rate  Volume:  Increased  Mood:  Angry, Anxious, and Irritable  Affect:  Congruent, Labile, and Anxious  Thought Process:  Goal Directed and Descriptions of Associations: Intact  Orientation:  Full (Time, Place, and Person)  Thought Content: Logical   Suicidal Thoughts:  No  Homicidal Thoughts:  No  Memory:  WNL  Judgement:  Good  Insight:  Good  Psychomotor Activity:  Normal  Concentration:  Concentration: Good and Attention Span: Good  Recall:  Good  Fund of Knowledge: Good  Language: Good  Assets:  Desire for Improvement  ADL's:  Intact   Cognition: WNL  Prognosis:  Good   Reviewed ER note from 10/12/2020, including labs and CT scan of the abdomen.   DIAGNOSES:    ICD-10-CM   1. Generalized anxiety disorder  F41.1     2. Bipolar I disorder (HCC)  F31.9 10-Hydroxycarbazepine    Lamotrigine level    3. Encounter for long-term (current) use of medications  Z79.899 10-Hydroxycarbazepine    Lamotrigine level    4. Insomnia due to other mental disorder  F51.05    F99     5. Irritability and anger  R45.4     6. Stress due to illness of family member  Z63.79       Receiving Psychotherapy: No    not recently.  RECOMMENDATIONS:  PDMP was reviewed.  Last Xanax filled 09/29/2020.  Last Sonata filled 08/06/2020. I provided 40 minutes of face to face time during this encounter, including time spent before and after the visit in records review, complex medical decision making, counseling pertinent to today's visit, and charting.  We discussed using the Xanax as she is.  She has never taken Klonopin so I recommend changing that initially.  She and I both want to get her off of a benzo but now is certainly not the time.  We discussed the benefits, risks, and side effects of the benzodiazepine class including tolerance and addiction.  She has become tolerant and is physically dependent on the medication.  The Klonopin will provide longer coverage dose so we will switch to that for now. Also increase the Trileptal to help with anger, irritability, and anxiety. Strongly recommend she get back into counseling. Start Klonopin 1 mg 1 p.o. 3 times daily as needed and may repeat 1/2 pill during the day as needed.  She can decrease the dose down to 3 mg total per day once the increase Trileptal becomes effective.  Cautioned her not to decrease further without discussing with me because of possible risk of seizures.  She understands.   Increase hydroxyzine to 25 mg, 1 p.o. 3 times daily as needed. Continue Zoloft 100 mg, 2.5  p.o.  daily. Continue Wellbutrin XL 150 mg, 3 p.o. every morning. Increase Trileptal 300 mg  to 1 p.o. every morning and 2 p.o. nightly. Continue Lamictal 200 mg, 2.5 pills daily. Continue Sonata 10 mg, 1 p.o. nightly as needed and may repeat 1 as needed for mid nocturnal awakening as long as she has 3 hours left to sleep. Restart counseling. Labs ordered as above. Return in 6 weeks.  [name], PA-C

## 2020-10-18 NOTE — Telephone Encounter (Signed)
I looked at single care and goodrx  and the pt cost would be much higher after her insurance pay their portion. She doesn't have commercial insurance for a Trulance card itself to be eligible for their program. The pt has the lowest cost she can get. She doesn't qualify for [facility] because of insurance purposes. [facility] in [place] is still not taking on any new clients for Rx's.

## 2020-10-18 NOTE — Telephone Encounter (Signed)
Noted. Thank you for checking into this!

## 2020-10-18 NOTE — Telephone Encounter (Signed)
Patient called the office back in relation to prior patient messages from 8/21 thread.  She was recently diagnosed with C. difficile on 9/1 and is currently on Dificid. CT in the ED on 9/1 reassuring. She did have significant diarrhea for 24 hours prior to C. difficile diagnosis, but has returned to baseline constipation.  Last BM was this morning.  States it was small and she had to strain.  Also passing a lot of gas.  She is still taking Linzess 290 mcg daily and MiraLAX BID.  She is unable to afford Amitiza or Trulance.  I have recommended trying Colace 100-300 mg daily in addition to Linzess as MiraLAX does not seem to be helping at this point.  She has an appointment on 9/20 for follow-up.   Dena: Are there any options for patient assistance for Trulance or Amitiza? Unfortunately, she is not able to afford either of these medications.

## 2020-10-18 NOTE — Telephone Encounter (Signed)
No problem.

## 2020-10-30 NOTE — Progress Notes (Signed)
Please let her know the Lamictal and Trileptal levels are good.  No medication changes needed.

## 2020-10-31 ENCOUNTER — Encounter: Payer: Self-pay | Admitting: *Deleted

## 2020-10-31 ENCOUNTER — Ambulatory Visit (INDEPENDENT_AMBULATORY_CARE_PROVIDER_SITE_OTHER): Payer: 59 | Admitting: Allergy and Immunology

## 2020-10-31 ENCOUNTER — Encounter: Payer: Self-pay | Admitting: Internal Medicine

## 2020-10-31 ENCOUNTER — Other Ambulatory Visit: Payer: Self-pay

## 2020-10-31 ENCOUNTER — Ambulatory Visit (INDEPENDENT_AMBULATORY_CARE_PROVIDER_SITE_OTHER): Payer: 59 | Admitting: Internal Medicine

## 2020-10-31 VITALS — BP 115/71 | HR 84 | Temp 97.7°F | Ht 67.0 in | Wt 236.0 lb

## 2020-10-31 DIAGNOSIS — K5732 Diverticulitis of large intestine without perforation or abscess without bleeding: Secondary | ICD-10-CM

## 2020-10-31 DIAGNOSIS — J4550 Severe persistent asthma, uncomplicated: Secondary | ICD-10-CM

## 2020-10-31 DIAGNOSIS — K5904 Chronic idiopathic constipation: Secondary | ICD-10-CM

## 2020-10-31 DIAGNOSIS — F553 Abuse of steroids or hormones: Secondary | ICD-10-CM

## 2020-10-31 DIAGNOSIS — K219 Gastro-esophageal reflux disease without esophagitis: Secondary | ICD-10-CM | POA: Diagnosis not present

## 2020-10-31 DIAGNOSIS — J3089 Other allergic rhinitis: Secondary | ICD-10-CM

## 2020-10-31 DIAGNOSIS — R194 Change in bowel habit: Secondary | ICD-10-CM

## 2020-10-31 DIAGNOSIS — A0472 Enterocolitis due to Clostridium difficile, not specified as recurrent: Secondary | ICD-10-CM

## 2020-10-31 DIAGNOSIS — G4733 Obstructive sleep apnea (adult) (pediatric): Secondary | ICD-10-CM

## 2020-10-31 MED ORDER — AIRDUO DIGIHALER 232-14 MCG/ACT IN AEPB: 1.0000 | INHALATION_SPRAY | Freq: Two times a day (BID) | RESPIRATORY_TRACT | 2 refills | Status: DC

## 2020-10-31 MED ORDER — AZELASTINE-FLUTICASONE 137-50 MCG/ACT NA SUSP: 1.0000 | Freq: Two times a day (BID) | NASAL | 2 refills | Status: DC

## 2020-10-31 MED ORDER — ALBUTEROL SULFATE HFA 108 (90 BASE) MCG/ACT IN AERS: 2.0000 | INHALATION_SPRAY | Freq: Four times a day (QID) | RESPIRATORY_TRACT | 1 refills | Status: DC | PRN

## 2020-10-31 NOTE — Patient Instructions (Addendum)
  1.  Allergen avoidance measures - pollen  2.  Treat and prevent inflammation:  A. Dymista - 1 spray each nostril 2 times per day B. AirDuo 232 - 1 inhalations 2 times a day (specialty pharmacy)  3.  Treat and prevent reflux/LPR:  A. Consolidate Caffeine as much as possible B. Protonix 40 mg - 1 tablet 2 times per day C. Famotidine 40 mg - 1 tablet 1 time per day  4.  If needed:  A. Albuterol HFA - 2 inhalations every 4-6 hours B. Cetirizine 10 mg - 1-2 tablet 1-2 times per day (MAX=40mg /day)  5.  Do not use any more prednisone  6.  Return to clinic in 4 weeks or earlier if problem  7.  Obtain fall flu vaccine

## 2020-10-31 NOTE — Progress Notes (Signed)
Informed through mychart 

## 2020-10-31 NOTE — Patient Instructions (Signed)
It was good to see you again today!  Continue Protonix 40 mg once daily for GERD  Stop Linzess for now  Use MiraLAX at night on occasion for constipation  Continue daily probiotic  Utilize 1-2 kiwi fruit daily for constipation-it is proven to work  Plan for diagnostic colonoscopy (abnormal colon on CT)-end of October or 1 November prior to the 10th-ASA 3-propofol  Further recommendations to follow.

## 2020-10-31 NOTE — Progress Notes (Signed)
Primary Care Physician:  Manon Hilding, MD Primary Gastroenterologist:  Dr.   Pre-Procedure History & Physical: HPI:  Kristen [name] is a 50 y.o. female here for follow-up of a myriad of GI issues over the past year or so.  Patient recently diverticulitis (see CT documented).  Took antibiotics.  Came down with acute diarrheal illness.  C. difficile diagnosed.  She just completed a course of Dificid-apparently first bout.  Urosepsis for which she was hospitalized previously.  Also, history of Giardia for which she was treated last year. No longer taking Linzess.  Has 1 bowel movement daily  - perceives they are loose but has to strain.  Colonoscopy planned for abnormal colon on CT but this was put off given recent diverticulitis and Cdiff.. EGD last month for dysphagia.  Normal upper GI tract.  Empiric passage of a Maloney dilator.  Reflux well controlled on Protonix 40 mg daily.  Patient reports being documented to have sleep apnea with O2 sats down to 83%.  She says [facility] surgeon of CPAP.  Is not treated at this time.  Past Medical History:  Diagnosis Date   Allergies    Anemia    Anxiety    Arthritis    Atypical mole 11/03/2006   mid upper back (slight to moderate)   Atypical mole 11/03/2006   lower right back (slight to moderate)   Back pain    Bipolar 1 disorder (HCC)    Borderline diabetic    Chronic constipation    Constipation    Depression    Family history of adverse reaction to anesthesia    mother-- ponv   Fatty liver    GAD (generalized anxiety disorder)    GERD (gastroesophageal reflux disease)    Hiatal hernia    High triglycerides    History of kidney stones    History of recurrent UTIs    History of sepsis 06/2018   History of stomach ulcers    History of suicidal ideation    Hyperlipidemia    Hypothyroidism    IDA (iron deficiency anemia)    Joint pain    Melanoma ([place]) 11/03/2006   left chest in situ (excision)   Orthostasis     Palpitations    PONV (postoperative nausea and vomiting)    Prediabetes    Rapid heart rate    Renal calculus, left    Seasonal allergies    Shortness of breath    Sleep apnea    Sleep disorder, unspecified    per excessive sleeping during the day   Snoring    Swallowing difficulty    Tachycardia    Vertigo    Vitamin D deficiency    Weakness    Wears contact lenses     Past Surgical History:  Procedure Laterality Date   ANTERIOR CERVICAL DECOMP/DISCECTOMY FUSION  02/17/2012   Procedure: ANTERIOR CERVICAL DECOMPRESSION/DISCECTOMY FUSION 2 LEVELS;  Surgeon: Hosie Spangle, MD;  Location: [facility];  Service: Neurosurgery;  Laterality: N/A;  Cervical four-five and Cervical six-seven anterior cervial decompression with fusion plating and bonegraft   ANTERIOR CERVICAL DECOMP/DISCECTOMY FUSION  04-01-2001   _0    C5---6   BIOPSY  07/20/2018   Procedure: BIOPSY;  Surgeon: Daneil Dolin, MD;  Location: [facility];  Service: Endoscopy;;  gastric    BREAST REDUCTION SURGERY Bilateral 1995   [place]  x2   last one 1998   with BILATERAL TUBAL LIGATION   CESAREAN SECTION WITH  BILATERAL TUBAL LIGATION     COLONOSCOPY WITH PROPOFOL N/A 07/20/2018   Dr. Gala Romney: Diverticulosis, internal hemorrhoids, 5 mm splenic flexure tubular adenoma removed. Repeat in 5 years due to family history.   CYSTOSCOPY/URETEROSCOPY/HOLMIUM LASER/STENT PLACEMENT Left 08/21/2018   Procedure: CYSTOSCOPY LEFT RETROGRADE PYELOGRAM /URETEROSCOPY/HOLMIUM LASER/STENT PLACEMENT;  Surgeon: Ceasar Mons, MD;  Location: [facility];  Service: Urology;  Laterality: Left;   DILATION AND CURETTAGE OF UTERUS  1992   for miscarriage   ENDOMETRIAL ABLATION  2014   ESOPHAGOGASTRODUODENOSCOPY     20 years ago.    ESOPHAGOGASTRODUODENOSCOPY (EGD) WITH PROPOFOL N/A 07/20/2018   Dr. Gala Romney: Small hiatal hernia, erythematous mucosa in the stomach with a healing gastric ulcer measuring 1 cm,  biopsies negative.   ESOPHAGOGASTRODUODENOSCOPY (EGD) WITH PROPOFOL N/A 10/05/2020   Procedure: ESOPHAGOGASTRODUODENOSCOPY (EGD) WITH PROPOFOL;  Surgeon: Daneil Dolin, MD;  Location: [facility];  Service: Endoscopy;  Laterality: N/A;   MALONEY DILATION N/A 10/05/2020   Procedure: Venia Minks DILATION;  Surgeon: Daneil Dolin, MD;  Location: [facility];  Service: Endoscopy;  Laterality: N/A;   [place]   unilateral for infertility   NASAL SINUS SURGERY  2010  approx.   POLYPECTOMY  07/20/2018   Procedure: POLYPECTOMY;  Surgeon: Daneil Dolin, MD;  Location: [facility];  Service: Endoscopy;;   POSTERIOR CERVICAL FUSION/FORAMINOTOMY N/A 08/18/2013   Procedure: CERVICAL FOUR TO CERVICAL SEVEN POSTERIOR CERVICAL FUSION/FORAMINOTOMY LEVEL 3;  Surgeon: Hosie Spangle, MD;  Location: [place] [facility];  Service: Neurosurgery;  Laterality: N/A;  C4-7 posterior cervical fusion with lateral mass fixation   RIGHT/LEFT HEART CATH AND CORONARY ANGIOGRAPHY N/A 12/23/2019   Procedure: RIGHT/LEFT HEART CATH AND CORONARY ANGIOGRAPHY;  Surgeon: Burnell Blanks, MD;  Location: [place] [facility];  Service: Cardiovascular;  Laterality: N/A;    Prior to Admission medications   Medication Sig Start Date End Date Taking? Authorizing Provider  acetaminophen (TYLENOL) 500 MG tablet Take 1,000 mg by mouth every 6 (six) hours as needed for moderate pain.   Yes [provider]  acidophilus (RISAQUAD) CAPS capsule Take by mouth daily.   Yes [provider]  aspirin EC 81 MG tablet Take 1 tablet (81 mg total) by mouth daily. Swallow whole. 12/17/19  Yes Freada Bergeron, MD  b complex vitamins capsule Take 1 capsule by mouth daily.   Yes [provider]  buPROPion (WELLBUTRIN XL) 150 MG 24 hr tablet TAKE 3 TABLETS BY MOUTH ONCE DAILY IN THE MORNING 09/29/20  Yes Hurst, Teresa T, PA-C  cetirizine (ZYRTEC) 10 MG tablet Take 10 mg by mouth daily.   Yes [provider]  clonazePAM (KLONOPIN) 1 MG tablet 1 p.o. 3 times daily as needed anxiety and may repeat 1/2 pill during the middle of the day as needed anxiety.  No more than 3.5 mg/day. 10/18/20  Yes Hurst, Helene Kelp T, PA-C  estradiol (VIVELLE-DOT) 0.075 MG/24HR Place 1 patch onto the skin 2 (two) times a week. 07/05/20  Yes [provider]  ezetimibe (ZETIA) 10 MG tablet Take 1 tablet (10 mg total) by mouth daily. 09/11/20  Yes Freada Bergeron, MD  fenofibrate (TRICOR) 145 MG tablet Take 1 tablet (145 mg total) by mouth daily. 02/17/20  Yes Freada Bergeron, MD  hydrocortisone (ANUSOL-HC) 2.5 % rectal cream Place 1 application rectally 2-4 times daily for rectal bleeding/burning related to hemorrhoids. Patient taking differently: as needed. Place 1 application rectally 2-4 times daily for  rectal bleeding/burning related to hemorrhoids. 07/20/20  Yes Erenest Rasher, PA-C  hydrOXYzine (ATARAX/VISTARIL) 25 MG tablet Take 1 tablet by mouth three times daily as needed Patient taking differently: Take 25 mg by mouth every 8 (eight) hours as needed. 10/10/20  Yes Hurst, Teresa T, PA-C  icosapent Ethyl (VASCEPA) 1 g capsule Take 2 capsules (2 g total) by mouth 2 (two) times daily. 12/17/19  Yes Freada Bergeron, MD  lamoTRIgine (LAMICTAL) 200 MG tablet TAKE 2 & 1/2 (TWO & ONE-HALF) TABLETS BY MOUTH ONCE DAILY 09/29/20  Yes Adelene Idler, Teresa T, PA-C  levothyroxine (SYNTHROID) 75 MCG tablet Take 75 mcg by mouth daily before breakfast.  05/27/18  Yes [provider]  linaclotide (LINZESS) 290 MCG CAPS capsule Take 290 mcg by mouth daily before breakfast.   Yes [provider]  loperamide (IMODIUM) 2 MG capsule Take 1 capsule (2 mg total) by mouth 4 (four) times daily as needed for diarrhea or loose stools. 10/12/20  Yes Varney Biles, MD  meclizine (ANTIVERT) 25 MG tablet Take 25 mg by mouth 3 (three) times daily as needed for dizziness.   Yes [provider]  metFORMIN  (GLUCOPHAGE) 500 MG tablet Take 1 tablet (500 mg total) by mouth 2 (two) times daily with a meal. Patient taking differently: Take 250 mg by mouth daily as needed (blood sugar of 150 or above). 11/24/19  Yes Laqueta Linden, MD  montelukast (SINGULAIR) 10 MG tablet Take 10 mg by mouth daily.  10/26/18  Yes [provider]  ondansetron (ZOFRAN) 4 MG tablet Take 1 tablet (4 mg total) by mouth every 6 (six) hours as needed. 06/21/20  Yes Mahala Menghini, PA-C  Oxcarbazepine (TRILEPTAL) 300 MG tablet 1 po q am, 2 po qhs 10/18/20  Yes Hurst, Teresa T, PA-C  pantoprazole (PROTONIX) 40 MG tablet Take 1 tablet (40 mg total) by mouth 2 (two) times daily with a meal. 07/11/20  Yes Annitta Needs, NP  progesterone (PROMETRIUM) 100 MG capsule Take 100 mg by mouth at bedtime. 07/04/20  Yes [provider]  rosuvastatin (CRESTOR) 5 MG tablet Take 1 tablet (5 mg total) by mouth daily. Patient taking differently: Take 5 mg by mouth at bedtime. 06/12/20  Yes Freada Bergeron, MD  sertraline (ZOLOFT) 100 MG tablet Take 2 tablets (200 mg total) by mouth at bedtime. 01/21/20  Yes Hurst, Teresa T, PA-C  zaleplon (SONATA) 10 MG capsule TAKE 1 CAPSULE BY MOUTH AT BEDTIME AS NEEDED, MAY REPEAT 1 FOR MIDNOCTURNAL AWAKENING AS NEEDED IF THREE HOURS LEFT TO SLEEP 02/21/20  Yes Hurst, Teresa T, PA-C  ALPRAZolam (XANAX) 1 MG tablet TAKE 1 TABLET BY MOUTH THREE TIMES DAILY AS NEEDED. MAY TAKE ADDITIONAL 1/2 TABLET DURING THE DAY AS NEEDED Patient not taking: Reported on 10/31/2020 09/29/20   [name] T, PA-C  fidaxomicin (DIFICID) 200 MG TABS tablet Take 1 tablet (200 mg total) by mouth 2 (two) times daily. Patient not taking: Reported on 10/31/2020 10/12/20   Varney Biles, MD  Plecanatide (TRULANCE) 3 MG TABS Take 3 mg by mouth daily. Patient not taking: Reported on 10/31/2020 10/05/20   Erenest Rasher, PA-C  polyethylene glycol (MIRALAX / GLYCOLAX) 17 g packet Take 17 g by mouth 2 (two) times  daily. Patient not taking: Reported on 10/31/2020    [provider]  polyethylene glycol-electrolytes (TRILYTE) 420 g solution Take 4,000 mLs by mouth as directed. Patient not taking: Reported on 10/31/2020 08/15/20   Daneil Dolin,  MD    Allergies as of 10/31/2020 - Review Complete 10/31/2020  Allergen Reaction Noted   Iohexol Hives 11/03/2007   Rosuvastatin Other (See Comments) 02/09/2020    Family History  Problem Relation Age of Onset   Asthma Mother    Rheum arthritis Mother    Non-Hodgkin's lymphoma Mother    Congestive Heart Failure Mother    Atrial fibrillation Mother    Diabetes Mother    Hyperlipidemia Mother    Cancer Mother    Colon cancer Father    Multiple myeloma Father    Hypertension Father    Cancer Father    Depression Father    Anxiety disorder Father    Alcoholism Father    Obesity Father    Allergies Other        entire family(mom and dad)   Diabetes Maternal Grandmother    Congestive Heart Failure Maternal Grandmother    Mental illness Paternal Grandmother    Colon cancer Paternal Grandfather    Bone cancer Paternal Grandfather     Social History   Socioeconomic History   Marital status: Married    Spouse name: Not on file   Number of children: 2   Years of education: Not on file   Highest education level: Not on file  Occupational History   Occupation: disability  Tobacco Use   Smoking status: Former    Packs/day: 1.00    Years: 5.00    Pack years: 5.00    Types: Cigarettes    Quit date: 02/09/2002    Years since quitting: 18.7   Smokeless tobacco: Never  Vaping Use   Vaping Use: Never used  Substance and Sexual Activity   Alcohol use: No   Drug use: No   Sexual activity: Yes    Birth control/protection: Surgical  Other Topics Concern   Not on file  Social History Narrative   Step brother-2   Step sister-2   Half sister -1 healthy   Social Determinants of Health   Financial Resource Strain: Not on file  Food  Insecurity: Not on file  Transportation Needs: Not on file  Physical Activity: Not on file  Stress: Not on file  Social Connections: Not on file  Intimate Partner Violence: Not on file    Review of Systems: See HPI, otherwise negative ROS  Physical Exam: BP 115/71   Pulse 84   Temp 97.7 F (36.5 C) (Temporal)   Ht 5' 7" (1.702 m)   Wt 236 lb (107 kg)   BMI 36.96 kg/m  General:   Alert,   pleasant and cooperative in NAD Skin:  Intact without significant lesions or rashes.  Multiple tattoos. Neck:  Supple; no masses or thyromegaly. No significant cervical adenopathy. Lungs:  Clear throughout to auscultation.   No wheezes, crackles, or rhonchi. No acute distress. Heart:  Regular rate and rhythm; no murmurs, clicks, rubs,  or gallops. Abdomen: Central obesity.  Positive bowel sounds soft and nontender without appreciable mass organomegaly pulses:  Normal pulses noted. Extremities:  Without clubbing or edema.  Impression/Plan: 50 year old lady with GERD well-controlled on Protonix 40 mg daily.  Dysphagia  - improved status post Maloney dilation.  Multiple recent GI and non-GI infection including uro-sepsis, Giardia, documented diverticulitis and more recently C. difficile associated diarrhea.  She has improved but is not yet at baseline.  Her GI tract certainly has been in upheaval over the past few months.  Clinically, does not look acutely ill or toxic at this time.  I agree she needs to have a colonoscopy given abnormality seen on CT recently.  Obstructive sleep apnea needs treatment as a separate issue as this disorder may be negatively impacting her health in general.  Recommendations:  Continue Protonix 40 mg once daily for GERD  Stop Linzess for now  Use MiraLAX at night on occasion for constipation  Continue daily probiotic  Utilize 1-2 kiwi fruit daily for constipation-it is proven to work  Plan for diagnostic colonoscopy (abnormal colon on CT)-end of October or 1  November prior to the 10th-ASA 3-propofol  Further recommendations to follow.   Notice: This dictation was prepared with Dragon dictation along with smaller phrase technology. Any transcriptional errors that result from this process are unintentional and may not be corrected upon review.  

## 2020-10-31 NOTE — Progress Notes (Signed)
Kristen [name] - Kristen [name] - Kristen [name] - Kristen [name] - Kristen [name]   Dear Dr. Quintin Alto,  Thank you for referring Kristen [name] to the [facility] on 10/31/2020.   Below is a summation of this patient's evaluation and recommendations.  Thank you for your referral. I will keep you informed about this patient's response to treatment.   If you have any questions please do not hesitate to contact me.   Sincerely,  Jiles Prows, MD Allergy / [facility]   ______________________________________________________________________    NEW PATIENT NOTE  Referring Provider: Manon Hilding, MD Primary Provider: Manon Hilding, MD Date of office visit: 10/31/2020    Subjective:   Chief Complaint:  Kristen [name] (DOB: [birth year]) is a 50 y.o. female who presents to the clinic on 10/31/2020 with a chief complaint of No chief complaint on file. Marland Kitchen     HPI: Kristen [name] presents to this clinic in evaluation of multiple issues.  First, she apparently coughs.  She coughs all the time.  Her cough is associated with a lump of gunk in her throat and choking.  Her cough disturbs her sleep.  She has had thorough evaluation for this cough with the pulmonologist and the gastroenterologist and ENT.  She has had an upper endoscopy with esophageal dilation, pulmonary function test with lung volumes, and rhinoscopy performed.  She has been given albuterol which does not help her at all.  She has seen Dr. Shearon Stalls, pulmonology, who performed full lung volumes.  Second, she does have reflux disease.  She is currently on Protonix twice a day which she thinks works quite well.  She does not have regurgitation while using this proton pump inhibitor.  She does drink 2 coffees every morning but has no other sources of caffeine.  Third, she has "sneezing attacks". She will sneeze all day and she gets pressure in her head and  nasal congestion and watery eyes.  This has been going on for decades.  She has seen an allergist several decades ago and recently saw Dr. Fredderick Phenix, allergist,.  No therapy to date including Zyrtec and Singulair and Flonase has helped her at all.  She has seen Dr. Wilburn Cornelia and Dr. Redmond Baseman, ENT.  Fourth, she has untreated sleep apnea.  Apparently she gets hypoxic at nighttime as she informs [place] that she gets an oxygen level down to 83% at night.  She is waiting for a CPAP machine as there is a Producer, television/film/video of these devices.  Fifth, she has headaches.  She has a daily headache in her frontal region and around her eyes and below her eyes and is associated with dizziness.  In addition, she has intermittent and spontaneous vertigo for which she will occasionally take meclizine.  Sixth, she uses prednisone quite commonly.  She uses her husband's source of prednisone.  She will take 80 mg of prednisone 1 or 2 times per month for "bad cough".  As well, she uses about 20 mg 1 time per week and she has been stuck in this pattern for years.  Past Medical History:  Diagnosis Date   Allergies    Anemia    Anxiety    Arthritis    Atypical mole 11/03/2006   mid upper back (slight to moderate)   Atypical mole 11/03/2006   lower right back (slight to moderate)   Back pain    Bipolar 1 disorder ([place])  Borderline diabetic    Chronic constipation    Constipation    Depression    Family history of adverse reaction to anesthesia    mother-- ponv   Fatty liver    GAD (generalized anxiety disorder)    GERD (gastroesophageal reflux disease)    Hiatal hernia    Kristen triglycerides    History of kidney stones    History of recurrent UTIs    History of sepsis 06/2018   History of stomach ulcers    History of suicidal ideation    Hyperlipidemia    Hypothyroidism    IDA (iron deficiency anemia)    Joint pain    Melanoma ([place]) 11/03/2006   left chest in situ (excision)   Orthostasis    Palpitations     PONV (postoperative nausea and vomiting)    Prediabetes    Rapid heart rate    Renal calculus, left    Seasonal allergies    Shortness of breath    Sleep apnea    Sleep disorder, unspecified    per excessive sleeping during the day   Snoring    Swallowing difficulty    Tachycardia    Vertigo    Vitamin D deficiency    Weakness    Wears contact lenses     Past Surgical History:  Procedure Laterality Date   ANTERIOR CERVICAL DECOMP/DISCECTOMY FUSION  02/17/2012   Procedure: ANTERIOR CERVICAL DECOMPRESSION/DISCECTOMY FUSION 2 LEVELS;  Surgeon: Hosie Spangle, MD;  Location: [facility];  Service: Neurosurgery;  Laterality: N/A;  Cervical four-five and Cervical six-seven anterior cervial decompression with fusion plating and bonegraft   ANTERIOR CERVICAL DECOMP/DISCECTOMY FUSION  04-01-2001   _0    C5---6   BIOPSY  07/20/2018   Procedure: BIOPSY;  Surgeon: Daneil Dolin, MD;  Location: [facility];  Service: Endoscopy;;  gastric    BREAST REDUCTION SURGERY Bilateral 1995   [place]  x2   last one 1998   with BILATERAL TUBAL LIGATION   CESAREAN SECTION WITH BILATERAL TUBAL LIGATION     COLONOSCOPY WITH PROPOFOL N/A 07/20/2018   Dr. Gala Romney: Diverticulosis, internal hemorrhoids, 5 mm splenic flexure tubular adenoma removed. Repeat in 5 years due to family history.   CYSTOSCOPY/URETEROSCOPY/HOLMIUM LASER/STENT PLACEMENT Left 08/21/2018   Procedure: CYSTOSCOPY LEFT RETROGRADE PYELOGRAM /URETEROSCOPY/HOLMIUM LASER/STENT PLACEMENT;  Surgeon: Ceasar Mons, MD;  Location: [facility];  Service: Urology;  Laterality: Left;   DILATION AND CURETTAGE OF UTERUS  1992   for miscarriage   ENDOMETRIAL ABLATION  2014   ESOPHAGOGASTRODUODENOSCOPY     20 years ago.    ESOPHAGOGASTRODUODENOSCOPY (EGD) WITH PROPOFOL N/A 07/20/2018   Dr. Gala Romney: Small hiatal hernia, erythematous mucosa in the stomach with a healing gastric ulcer measuring 1 cm, biopsies  negative.   ESOPHAGOGASTRODUODENOSCOPY (EGD) WITH PROPOFOL N/A 10/05/2020   Procedure: ESOPHAGOGASTRODUODENOSCOPY (EGD) WITH PROPOFOL;  Surgeon: Daneil Dolin, MD;  [facility];  Service: Endoscopy;  Laterality: N/A;   MALONEY DILATION N/A 10/05/2020   Procedure: Venia Minks DILATION;  Surgeon: Daneil Dolin, MD;  [facility];  Service: Endoscopy;  Laterality: N/A;   [place]   unilateral for infertility   NASAL SINUS SURGERY  2010  approx.   POLYPECTOMY  07/20/2018   Procedure: POLYPECTOMY;  Surgeon: Daneil Dolin, MD;  [facility]: [facility];  Service: Endoscopy;;   POSTERIOR CERVICAL FUSION/FORAMINOTOMY N/A 08/18/2013   Procedure: CERVICAL FOUR TO CERVICAL SEVEN POSTERIOR CERVICAL FUSION/FORAMINOTOMY LEVEL 3;  Surgeon: Hosie Spangle, MD;  Location: [facility];  Service: Neurosurgery;  Laterality: N/A;  C4-7 posterior cervical fusion with lateral mass fixation   RIGHT/LEFT HEART CATH AND CORONARY ANGIOGRAPHY N/A 12/23/2019   Procedure: RIGHT/LEFT HEART CATH AND CORONARY ANGIOGRAPHY;  Surgeon: Burnell Blanks, MD;  Location: [facility];  Service: Cardiovascular;  Laterality: N/A;    Allergies as of 10/31/2020       Reactions   Iohexol Hives    Desc: IVP DYE   Rosuvastatin Other (See Comments)   Muscle aches with 89m and 234m tolerates 63m89maily without issues        Medication List    acetaminophen 500 MG tablet Commonly known as: TYLENOL Take 1,000 mg by mouth every 6 (six) hours as needed for moderate pain.   acidophilus Caps capsule Take by mouth daily.   aspirin EC 81 MG tablet Take 1 tablet (81 mg total) by mouth daily. Swallow whole.   b complex vitamins capsule Take 1 capsule by mouth daily.   buPROPion 150 MG 24 hr tablet Commonly known as: WELLBUTRIN XL TAKE 3 TABLETS BY MOUTH ONCE DAILY IN THE MORNING   cetirizine 10 MG tablet Commonly known as: ZYRTEC Take 10 mg by mouth daily.   clonazePAM 1  MG tablet Commonly known as: KlonoPIN 1 p.o. 3 times daily as needed anxiety and may repeat 1/2 pill during the middle of the day as needed anxiety.  No more than 3.5 mg/day.   estradiol 0.075 MG/24HR Commonly known as: VIVELLE-DOT Place 1 patch onto the skin 2 (two) times a week.   ezetimibe 10 MG tablet Commonly known as: ZETIA Take 1 tablet (10 mg total) by mouth daily.   fenofibrate 145 MG tablet Commonly known as: Tricor Take 1 tablet (145 mg total) by mouth daily.   hydrocortisone 2.5 % rectal cream Commonly known as: ANUSOL-HC Place 1 application rectally 2-4 times daily for rectal bleeding/burning related to hemorrhoids.   hydrOXYzine 25 MG tablet Commonly known as: ATARAX/VISTARIL Take 1 tablet by mouth three times daily as needed   icosapent Ethyl 1 g capsule Commonly known as: Vascepa Take 2 capsules (2 g total) by mouth 2 (two) times daily.   lamoTRIgine 200 MG tablet Commonly known as: LAMICTAL TAKE 2 & 1/2 (TWO & ONE-HALF) TABLETS BY MOUTH ONCE DAILY   levothyroxine 75 MCG tablet Commonly known as: SYNTHROID Take 75 mcg by mouth daily before breakfast.   loperamide 2 MG capsule Commonly known as: IMODIUM Take 1 capsule (2 mg total) by mouth 4 (four) times daily as needed for diarrhea or loose stools.   meclizine 25 MG tablet Commonly known as: ANTIVERT Take 25 mg by mouth 3 (three) times daily as needed for dizziness.   metFORMIN 500 MG tablet Commonly known as: GLUCOPHAGE Take 1 tablet (500 mg total) by mouth 2 (two) times daily with a meal.   montelukast 10 MG tablet Commonly known as: SINGULAIR Take 10 mg by mouth daily.   ondansetron 4 MG tablet Commonly known as: ZOFRAN Take 1 tablet (4 mg total) by mouth every 6 (six) hours as needed.   Oxcarbazepine 300 MG tablet Commonly known as: TRILEPTAL 1 po q am, 2 po qhs   pantoprazole 40 MG tablet Commonly known as: PROTONIX Take 1 tablet (40 mg total) by mouth 2 (two) times daily with a  meal.   polyethylene glycol-electrolytes 420 g solution Commonly known as: TriLyte Take 4,000 mLs by mouth as directed.  progesterone 100 MG capsule Commonly known as: PROMETRIUM Take 100 mg by mouth at bedtime.   rosuvastatin 5 MG tablet Commonly known as: CRESTOR Take 1 tablet (5 mg total) by mouth daily. What changed: when to take this   sertraline 100 MG tablet Commonly known as: Zoloft Take 2 tablets (200 mg total) by mouth at bedtime.   zaleplon 10 MG capsule Commonly known as: SONATA TAKE 1 CAPSULE BY MOUTH AT BEDTIME AS NEEDED, MAY REPEAT 1 FOR MIDNOCTURNAL AWAKENING AS NEEDED IF THREE HOURS LEFT TO SLEEP    Review of systems negative except as noted in HPI / PMHx or noted below:  Review of Systems  Constitutional: Negative.   HENT: Negative.    Eyes: Negative.   Respiratory: Negative.    Cardiovascular: Negative.   Gastrointestinal: Negative.   Genitourinary: Negative.   Musculoskeletal: Negative.   Skin: Negative.   Neurological: Negative.   Endo/Heme/Allergies: Negative.   Psychiatric/Behavioral: Negative.     Family History  Problem Relation Age of Onset   Asthma Mother    Rheum arthritis Mother    Non-Hodgkin's lymphoma Mother    Congestive Heart Failure Mother    Atrial fibrillation Mother    Diabetes Mother    Hyperlipidemia Mother    Cancer Mother    Colon cancer Father    Multiple myeloma Father    Hypertension Father    Cancer Father    Depression Father    Anxiety disorder Father    Alcoholism Father    Obesity Father    Allergies Other        entire family(mom and dad)   Diabetes Maternal Grandmother    Congestive Heart Failure Maternal Grandmother    Mental illness Paternal Grandmother    Colon cancer Paternal Grandfather    Bone cancer Paternal Grandfather     Social History   Socioeconomic History   Marital status: Married    Spouse name: Not on file   Number of children: 2   Years of education: Not on file   Highest  education level: Not on file  Occupational History   Occupation: disability  Tobacco Use   Smoking status: Former    Packs/day: 1.00    Years: 5.00    Pack years: 5.00    Types: Cigarettes    Quit date: 02/09/2002    Years since quitting: 18.7   Smokeless tobacco: Never  Vaping Use   Vaping Use: Never used  Substance and Sexual Activity   Alcohol use: No   Drug use: No   Sexual activity: Yes    Birth control/protection: Surgical  Other Topics Concern   Not on file  Social History Narrative   Step brother-2   Step sister-2   Half sister -1 healthy   Environmental and Social history  Lives in a house with a dry environment, no animals located inside the household, hardwood in the bedroom, no plastic on the bed, no plastic on the pillow, no smoking ongoing with inside the household.  Objective:   Vitals:   10/31/20 1435  BP: 124/68  Pulse: 81  Resp: 16  Temp: 98 F (36.7 C)  SpO2: 95%   Height: _0  (167.6 cm) Weight: 236 lb 12.8 oz (107.4 kg)  Physical Exam Constitutional:      Appearance: She is not diaphoretic.     Comments: Raspy voice  HENT:     Head: Normocephalic.     Right Ear: Tympanic membrane, ear canal and external ear normal.  Left Ear: Tympanic membrane, ear canal and external ear normal.     Nose: Nose normal. No mucosal edema or rhinorrhea.     Mouth/Throat:     Pharynx: Uvula midline. No oropharyngeal exudate.  Eyes:     Conjunctiva/sclera: Conjunctivae normal.  Neck:     Thyroid: No thyromegaly.     Trachea: Trachea normal. No tracheal tenderness or tracheal deviation.  Cardiovascular:     Rate and Rhythm: Normal rate and regular rhythm.     Heart sounds: Normal heart sounds, S1 normal and S2 normal. No murmur heard. Pulmonary:     Effort: No respiratory distress.     Breath sounds: Normal breath sounds. No stridor. No wheezing or rales.  Lymphadenopathy:     Head:     Right side of head: No tonsillar adenopathy.     Left side of  head: No tonsillar adenopathy.     Cervical: No cervical adenopathy.  Skin:    Findings: No erythema or rash.     Nails: There is no clubbing.  Neurological:     Mental Status: She is alert.    Diagnostics: Allergy skin tests were performed.  She demonstrated hypersensitivity to grass pollen.  Spirometry was performed and demonstrated an FEV1 of 2.92 @ 118 % of predicted. FEV1/FVC = 0.86  Results of pulmonary function testing performed 17 April 2020 identified TLC 97% predicted, RV 91% predicted, DL/VA 104% predicted  Results of a head CT scan obtained 10 Jul 2018 identified the following:  Sinuses/Orbits: Small right maxillary sinus. No evidence of acute sinus inflammation. Unremarkable orbits.  Review of a sleep study obtained 22 February 2020 identified 5.1% of sleep time with a oxygen saturation between 80-89%.   Assessment and Plan:    1. Not well controlled severe persistent asthma   2. Perennial allergic rhinitis   3. LPRD (laryngopharyngeal reflux disease)   4. Obstructive sleep apnea syndrome   5. Abuse of steroids     1.  Allergen avoidance measures - pollen  2.  Treat and prevent inflammation:  A. Dymista - 1 spray each nostril 2 times per day B. AirDuo 232 - 1 inhalations 2 times a day (specialty pharmacy)  3.  Treat and prevent reflux/LPR:  A. Consolidate Caffeine as much as possible B. Protonix 40 mg - 1 tablet 2 times per day C. Famotidine 40 mg - 1 tablet 1 time per day  4.  If needed:  A. Albuterol HFA - 2 inhalations every 4-6 hours B. Cetirizine 10 mg - 1-2 tablet 1-2 times per day (MAX=$RemoveBefo'40mg'[id number]$ /day)  5.  Do not use any more prednisone  6.  Return to clinic in 4 weeks or earlier if problem  7.  Obtain fall flu vaccine  I think we may not be getting a full sense of what is actually happening with Emryn regarding inflammation of her airway for she does use systemic steroids on a very common basis utilizing her husband's supply of prednisone.  I am  going to start her on anti-inflammatory agents for both her upper and lower airway.  Concerning the trigger for her respiratory tract irritation and inflammation it may all be related to reflux and we will have her aggressively address this issue as noted above.  I will regroup with her and 4 weeks or earlier to assess her response to this approach.  And, she certainly needs to use her CPAP machine as soon as it is available as she does develop some hypoxemia at nighttime  when she sleeps.  Jiles Prows, MD Allergy / [facility]

## 2020-11-01 ENCOUNTER — Other Ambulatory Visit: Payer: Self-pay | Admitting: Physician Assistant

## 2020-11-01 ENCOUNTER — Telehealth: Payer: Self-pay | Admitting: Physician Assistant

## 2020-11-01 ENCOUNTER — Encounter: Payer: Self-pay | Admitting: Allergy and Immunology

## 2020-11-01 MED ORDER — SERTRALINE HCL 100 MG PO TABS: 300.0000 mg | ORAL_TABLET | Freq: Every day | ORAL | 5 refills | Status: DC

## 2020-11-01 NOTE — Telephone Encounter (Signed)
The dose of the Trileptal was increased at our visit on 10/18/2020, that was the plan.  At any rate if she has not already increased it, she needs to.  It should now be Trileptal 300 mg, 1 p.o. every morning, and 2 p.o. nightly.  Repeat labs not necessary at this time.

## 2020-11-01 NOTE — Telephone Encounter (Signed)
Pt left a message that she is confused. She said that at her last visit with teresa she was told to get the levels check at the lab. Helene Kelp said if they were normal she could go up on her medicine.. So she doesn't understand the my chart message about no med changes.  Please call her at [phone]

## 2020-11-01 NOTE — Telephone Encounter (Signed)
Prescription was sent

## 2020-11-01 NOTE — Telephone Encounter (Signed)
Pt stated you told her if her lamotrigine levels came back normal,you would increase the med.She stated she is under a lot of stress and she did increase the trileptal.

## 2020-11-01 NOTE — Telephone Encounter (Signed)
Yes we did talk about increasing the Lamictal but I changed my mind and discussed this with her, I thought, because she is already 100 mg over the usual dose for bipolar depression.  It is safe because the neurologist use it for seizures at that dose.  Have her increase the Zoloft 100 mg from 250 mg up to 300 mg.  That will help with the depression as well as the anxiety.

## 2020-11-01 NOTE — Telephone Encounter (Signed)
Pt would like a new rx sent with the updated dose to [facility] in [place]

## 2020-11-01 NOTE — Telephone Encounter (Signed)
Please review.I sent her the message through my chart about her labs

## 2020-11-02 ENCOUNTER — Telehealth: Payer: Self-pay

## 2020-11-02 ENCOUNTER — Other Ambulatory Visit: Payer: Self-pay

## 2020-11-02 NOTE — Telephone Encounter (Signed)
Per pts my chart message she ask at my visit we talked about my CPAP issue.  I sent another message to my cardiologist.  However you did mention oxygen or some type of oxygen I'm willing to get it a try as long as it's just when I sleep.

## 2020-11-03 ENCOUNTER — Telehealth: Payer: Self-pay

## 2020-11-03 NOTE — Telephone Encounter (Signed)
Please sign for allergy testing and spiro

## 2020-11-05 ENCOUNTER — Encounter: Payer: Self-pay | Admitting: Allergy and Immunology

## 2020-11-06 ENCOUNTER — Encounter (HOSPITAL_COMMUNITY): Payer: Self-pay

## 2020-11-06 ENCOUNTER — Encounter: Payer: Self-pay | Admitting: Allergy and Immunology

## 2020-11-06 ENCOUNTER — Other Ambulatory Visit: Payer: Self-pay | Admitting: Physician Assistant

## 2020-11-07 ENCOUNTER — Encounter: Payer: Self-pay | Admitting: Allergy and Immunology

## 2020-11-09 ENCOUNTER — Other Ambulatory Visit: Payer: Self-pay | Admitting: Physician Assistant

## 2020-11-21 ENCOUNTER — Encounter: Payer: Self-pay | Admitting: Allergy and Immunology

## 2020-11-24 ENCOUNTER — Telehealth: Payer: Self-pay | Admitting: Cardiology

## 2020-11-24 NOTE — Telephone Encounter (Signed)
FYI, pt has covid and has not been able to use her CPAP machine in about a week. Her compliance appt is set up for 12/14/20

## 2020-11-24 NOTE — Telephone Encounter (Signed)
Called patient and left a message on her vm to let her know it would be ok as long as it has not been several weeks without usage. I asked patient to call me back with questions or concerns.

## 2020-11-27 ENCOUNTER — Other Ambulatory Visit: Payer: Self-pay

## 2020-11-27 ENCOUNTER — Telehealth: Payer: Self-pay | Admitting: Physician Assistant

## 2020-11-27 NOTE — Telephone Encounter (Signed)
Please call pt and find out what day she is leaving for [facility] so that we can let teresa know.Please send response to me

## 2020-11-27 NOTE — Telephone Encounter (Signed)
Kristen [name] called to cancel her appt because she has Covid.  She did not RS because she is going to [place] and will not be back until 02/16/21.  Said she will RS when she gets back.  But she needs refill of her sonata now.  [facility] has sent a request and not heard back.  However, since she is leaving she needs a refill on all her medications and needs a 90 day supply on all to be sure she has enough to get through her time in [place].

## 2020-11-28 ENCOUNTER — Other Ambulatory Visit: Payer: Self-pay | Admitting: Physician Assistant

## 2020-11-28 MED ORDER — ZALEPLON 10 MG PO CAPS: ORAL_CAPSULE | ORAL | 0 refills | Status: DC

## 2020-11-28 MED ORDER — BUPROPION HCL ER (XL) 150 MG PO TB24: 450.0000 mg | ORAL_TABLET | Freq: Every day | ORAL | 1 refills | Status: DC

## 2020-11-28 MED ORDER — CLONAZEPAM 1 MG PO TABS: ORAL_TABLET | ORAL | 0 refills | Status: DC

## 2020-11-28 MED ORDER — OXCARBAZEPINE 300 MG PO TABS: ORAL_TABLET | ORAL | 0 refills | Status: DC

## 2020-11-28 MED ORDER — LAMOTRIGINE 200 MG PO TABS: 500.0000 mg | ORAL_TABLET | Freq: Every day | ORAL | 0 refills | Status: DC

## 2020-11-28 NOTE — Telephone Encounter (Signed)
Patient is leaving 11/10. Verified pharmacy as [facility] on [address] in [place].

## 2020-11-28 NOTE — Telephone Encounter (Signed)
All prescriptions were sent for a 90-day supply with a note requesting May fill early because she is going to [place].  I did not send in the Zoloft as she already has 90-day supply prescription with refills.  If the pharmacy has questions they can call about that 1.

## 2020-11-28 NOTE — Telephone Encounter (Signed)
Pt is leaving for [facility] on 11/10 and is requesting a 90 day refill.If she gets 90 days now she will be out until she returns on 02/16/21,but if we send 30 day supply then it will be soon to fill before she leaves on 11/10.Please let me know what should be sent.She needs the sonata today for sure.

## 2020-11-30 ENCOUNTER — Ambulatory Visit: Payer: 59 | Admitting: Physician Assistant

## 2020-12-01 NOTE — Patient Instructions (Signed)
   Your procedure is scheduled on: 12/07/2020  Report to [facility] at     12:45  PM.  Call this number if you have problems the morning of surgery: [phone]   Remember:              Follow Directions on the letter you received from Your Physician's office regarding the Bowel Prep              No Smoking the day of Procedure :   Take these medicines the morning of surgery with A SIP OF WATER: Wellbutrin, zyrtec, Klonopin, Hydroxyzine, lamictal, levothyroxine, Zoloft and Pantoprazole   Do not wear jewelry, make-up or nail polish.    Do not bring valuables to the hospital.  Contacts, dentures or bridgework may not be worn into surgery.  .   Patients discharged the day of surgery will not be allowed to drive home.     Colonoscopy, Adult, Care After This sheet gives you information about how to care for yourself after your procedure. Your health care provider may also give you more specific instructions. If you have problems or questions, contact your health care provider. What can I expect after the procedure? After the procedure, it is common to have: A small amount of blood in your stool for 24 hours after the procedure. Some gas. Mild abdominal cramping or bloating.  Follow these instructions at home: General instructions  For the first 24 hours after the procedure: Do not drive or use machinery. Do not sign important documents. Do not drink alcohol. Do your regular daily activities at a slower pace than normal. Eat soft, easy-to-digest foods. Rest often. Take over-the-counter or prescription medicines only as told by your health care provider. It is up to you to get the results of your procedure. Ask your health care provider, or the department performing the procedure, when your results will be ready. Relieving cramping and bloating Try walking around when you have cramps or feel bloated. Apply heat to your abdomen as told by your health care provider. Use a heat  source that your health care provider recommends, such as a moist heat pack or a heating pad. Place a towel between your skin and the heat source. Leave the heat on for 20-30 minutes. Remove the heat if your skin turns bright red. This is especially important if you are unable to feel pain, heat, or cold. You may have a greater risk of getting burned. Eating and drinking Drink enough fluid to keep your urine clear or pale yellow. Resume your normal diet as instructed by your health care provider. Avoid heavy or fried foods that are hard to digest. Avoid drinking alcohol for as long as instructed by your health care provider. Contact a health care provider if: You have blood in your stool 2-3 days after the procedure. Get help right away if: You have more than a small spotting of blood in your stool. You pass large blood clots in your stool. Your abdomen is swollen. You have nausea or vomiting. You have a fever. You have increasing abdominal pain that is not relieved with medicine. This information is not intended to replace advice given to you by your health care provider. Make sure you discuss any questions you have with your health care provider. Document Released: 09/12/2003 Document Revised: 10/23/2015 Document Reviewed: 04/11/2015 Elsevier Interactive Patient Education  [employer].

## 2020-12-04 ENCOUNTER — Encounter (HOSPITAL_COMMUNITY)
Admission: RE | Admit: 2020-12-04 | Discharge: 2020-12-04 | Disposition: A | Discharge status: Home / Self Care | Payer: 59 | Source: Ambulatory Visit | Attending: Internal Medicine | Admitting: Internal Medicine

## 2020-12-04 VITALS — BP 127/64 | HR 86 | Temp 97.8°F | Resp 18 | Ht 66.0 in | Wt 235.0 lb

## 2020-12-04 DIAGNOSIS — Z01812 Encounter for preprocedural laboratory examination: Secondary | ICD-10-CM | POA: Insufficient documentation

## 2020-12-04 DIAGNOSIS — Z01818 Encounter for other preprocedural examination: Secondary | ICD-10-CM

## 2020-12-04 LAB — PREGNANCY, URINE: Preg Test, Ur: NEGATIVE

## 2020-12-07 ENCOUNTER — Encounter (HOSPITAL_COMMUNITY): Payer: Self-pay | Admitting: Internal Medicine

## 2020-12-07 ENCOUNTER — Ambulatory Visit (HOSPITAL_COMMUNITY): Payer: 59 | Admitting: Anesthesiology

## 2020-12-07 ENCOUNTER — Encounter (HOSPITAL_COMMUNITY)
Admission: RE | Disposition: A | Discharge status: Home / Self Care | Payer: Self-pay | Source: Home / Self Care | Attending: Internal Medicine

## 2020-12-07 ENCOUNTER — Ambulatory Visit (HOSPITAL_COMMUNITY)
Admission: RE | Admit: 2020-12-07 | Discharge: 2020-12-07 | Disposition: A | Discharge status: Home / Self Care | Payer: 59 | Attending: Internal Medicine | Admitting: Internal Medicine

## 2020-12-07 DIAGNOSIS — Z7982 Long term (current) use of aspirin: Secondary | ICD-10-CM | POA: Diagnosis not present

## 2020-12-07 DIAGNOSIS — Z7952 Long term (current) use of systemic steroids: Secondary | ICD-10-CM | POA: Diagnosis not present

## 2020-12-07 DIAGNOSIS — Z87891 Personal history of nicotine dependence: Secondary | ICD-10-CM | POA: Insufficient documentation

## 2020-12-07 DIAGNOSIS — Z7984 Long term (current) use of oral hypoglycemic drugs: Secondary | ICD-10-CM | POA: Insufficient documentation

## 2020-12-07 DIAGNOSIS — K5730 Diverticulosis of large intestine without perforation or abscess without bleeding: Secondary | ICD-10-CM | POA: Diagnosis not present

## 2020-12-07 DIAGNOSIS — R933 Abnormal findings on diagnostic imaging of other parts of digestive tract: Secondary | ICD-10-CM | POA: Diagnosis not present

## 2020-12-07 DIAGNOSIS — Z86010 Personal history of colonic polyps: Secondary | ICD-10-CM | POA: Diagnosis not present

## 2020-12-07 DIAGNOSIS — Z888 Allergy status to other drugs, medicaments and biological substances status: Secondary | ICD-10-CM | POA: Insufficient documentation

## 2020-12-07 DIAGNOSIS — Z79899 Other long term (current) drug therapy: Secondary | ICD-10-CM | POA: Insufficient documentation

## 2020-12-07 DIAGNOSIS — Z79890 Hormone replacement therapy (postmenopausal): Secondary | ICD-10-CM | POA: Diagnosis not present

## 2020-12-07 HISTORY — PX: COLONOSCOPY WITH PROPOFOL: SHX5780

## 2020-12-07 SURGERY — COLONOSCOPY WITH PROPOFOL
Anesthesia: General

## 2020-12-07 MED ORDER — LACTATED RINGERS IV SOLN
INTRAVENOUS | Status: DC
  Administered 2020-12-07: 1000 mL via INTRAVENOUS

## 2020-12-07 MED ORDER — PROPOFOL 10 MG/ML IV BOLUS
INTRAVENOUS | Status: DC | PRN
  Administered 2020-12-07: 100 mg via INTRAVENOUS
  Administered 2020-12-07: 100 ug/kg/min via INTRAVENOUS

## 2020-12-07 NOTE — Transfer of Care (Signed)
Immediate Anesthesia Transfer of Care Note  Patient: Kristen [name]  Procedure(s) Performed: COLONOSCOPY WITH PROPOFOL  Patient Location: Short Stay  Anesthesia Type:General  Level of Consciousness: awake, alert , oriented and patient cooperative  Airway & Oxygen Therapy: Patient Spontanous Breathing  Post-op Assessment: Report given to RN, Post -op Vital signs reviewed and stable and Patient moving all extremities X 4  Post vital signs: Reviewed and stable  Last Vitals:  Vitals Value Taken Time  BP    Temp    Pulse    Resp    SpO2      Last Pain:  Vitals:   12/07/20 1058  TempSrc: Oral  PainSc: 0-No pain      Patients Stated Pain Goal: 8 (27/78/24 2353)  Complications: No notable events documented.

## 2020-12-07 NOTE — Anesthesia Postprocedure Evaluation (Signed)
Anesthesia Post Note  Patient: Kristen [name]  Procedure(s) Performed: COLONOSCOPY WITH PROPOFOL  Patient location during evaluation: PACU Anesthesia Type: General Level of consciousness: awake and alert and oriented Pain management: pain level controlled Vital Signs Assessment: post-procedure vital signs reviewed and stable Respiratory status: spontaneous breathing, nonlabored ventilation and respiratory function stable Cardiovascular status: blood pressure returned to baseline and stable Postop Assessment: no apparent nausea or vomiting Anesthetic complications: no   No notable events documented.   Last Vitals:  Vitals:   12/07/20 1213 12/07/20 1215  BP: 97/67 100/64  Pulse: 67   Resp: 16   Temp: 36.6 C   SpO2: 97%     Last Pain:  Vitals:   12/07/20 1213  TempSrc: Oral  PainSc: 0-No pain                 Zarianna Dicarlo C Diara Chaudhari

## 2020-12-07 NOTE — Op Note (Signed)
[facility] Patient Name: Kristen [name] Procedure Date: 12/07/2020 11:24 AM MRN: [id number] Date of Birth: 1970-08-05 Attending MD: Norvel Richards , MD CSN: [id number] Age: 50 Admit Type: Outpatient Procedure:                Colonoscopy Indications:              Abnormal CT of the GI tract Providers:                Norvel Richards, MD, Janeece Riggers, RN, Nelma Rothman, Technician Referring MD:              Medicines:                Propofol per Anesthesia Complications:            No immediate complications. Estimated Blood Loss:     Estimated blood loss: none. Procedure:                Pre-Anesthesia Assessment:                           - Prior to the procedure, a History and Physical                            was performed, and patient medications and                            allergies were reviewed. The patient's tolerance of                            previous anesthesia was also reviewed. The risks                            and benefits of the procedure and the sedation                            options and risks were discussed with the patient.                            All questions were answered, and informed consent                            was obtained. Prior Anticoagulants: The patient has                            taken no previous anticoagulant or antiplatelet                            agents. ASA Grade Assessment: III - A patient with                            severe systemic disease. After reviewing the risks  and benefits, the patient was deemed in                            satisfactory condition to undergo the procedure.                           After obtaining informed consent, the colonoscope                            was passed under direct vision. Throughout the                            procedure, the patient's blood pressure, pulse, and                            oxygen saturations  were monitored continuously. The                            [id number]) scope was introduced through                            the anus and advanced to the the cecum, identified                            by appendiceal orifice and ileocecal valve. The                            colonoscopy was performed without difficulty. The                            patient tolerated the procedure well. The quality                            of the bowel preparation was adequate. The                            ileocecal valve, appendiceal orifice, and rectum                            were photographed. The entire colon was well                            visualized. Scope In: 11:41:53 AM Scope Out: 11:57:06 AM Scope Withdrawal Time: 0 hours 6 minutes 43 seconds  Total Procedure Duration: 0 hours 15 minutes 13 seconds  Findings:      The perianal and digital rectal examinations were normal.      Scattered medium-mouthed diverticula were found in the entire colon.      The exam was otherwise without abnormality on direct and retroflexion       views. Impression:               - Diverticulosis in the entire examined colon.                           - The  examination was otherwise normal on direct                            and retroflexion views.                           - No specimens collected. Moderate Sedation:      Moderate (conscious) sedation was personally administered by an       anesthesia professional. The following parameters were monitored: oxygen       saturation, heart rate, blood pressure, respiratory rate, EKG, adequacy       of pulmonary ventilation, and response to care. Recommendation:           - Patient has a contact number available for                            emergencies. The signs and symptoms of potential                            delayed complications were discussed with the                            patient. Return to normal activities tomorrow.                             Written discharge instructions were provided to the                            patient.                           - Advance diet as tolerated.                           - Continue present medications.                           - Repeat colonoscopy in 7 years for surveillance.                           - Return to GI office in 3 months. Procedure Code(s):        --- Professional ---                           [phone], Colonoscopy, flexible; diagnostic, including                            collection of specimen(s) by brushing or washing,                            when performed (separate procedure) Diagnosis Code(s):        --- Professional ---                           K57.30, Diverticulosis of large intestine without  perforation or abscess without bleeding                           R93.3, Abnormal findings on diagnostic imaging of                            other parts of digestive tract CPT copyright 2019 American Medical Association. All rights reserved. The codes documented in this report are preliminary and upon coder review may  be revised to meet current compliance requirements. Cristopher Estimable. Reza Crymes, MD Norvel Richards, MD 12/07/2020 12:02:55 PM This report has been signed electronically. Number of Addenda: 0

## 2020-12-07 NOTE — H&P (Signed)
'@LOGO' @   Primary Care Physician:  Manon Hilding, MD Primary Gastroenterologist:  Dr. Gala Romney  Pre-Procedure History & Physical: HPI:  Kristen [name] is a 50 y.o. female here for For diagnostic colonoscopy.  Abnormal colon on CT.  History of diverticulitis, Giardia and C. difficile over this past year.  Her bowel symptoms have settled down.  Clinically, doing well.    History colonic adenoma removed in 2020.  Past Medical History:  Diagnosis Date   Allergies    Anemia    Anxiety    Arthritis    Atypical mole 11/03/2006   mid upper back (slight to moderate)   Atypical mole 11/03/2006   lower right back (slight to moderate)   Back pain    Bipolar 1 disorder (HCC)    Borderline diabetic    Chronic constipation    Constipation    Depression    Family history of adverse reaction to anesthesia    mother-- ponv   Fatty liver    GAD (generalized anxiety disorder)    GERD (gastroesophageal reflux disease)    Hiatal hernia    High triglycerides    History of kidney stones    History of recurrent UTIs    History of sepsis 06/2018   History of stomach ulcers    History of suicidal ideation    Hyperlipidemia    Hypothyroidism    IDA (iron deficiency anemia)    Joint pain    Melanoma ([place]) 11/03/2006   left chest in situ (excision)   Orthostasis    Palpitations    PONV (postoperative nausea and vomiting)    Prediabetes    Rapid heart rate    Renal calculus, left    Seasonal allergies    Shortness of breath    Sleep apnea    Sleep disorder, unspecified    per excessive sleeping during the day   Snoring    Swallowing difficulty    Tachycardia    Vertigo    Vitamin D deficiency    Weakness    Wears contact lenses     Past Surgical History:  Procedure Laterality Date   ANTERIOR CERVICAL DECOMP/DISCECTOMY FUSION  02/17/2012   Procedure: ANTERIOR CERVICAL DECOMPRESSION/DISCECTOMY FUSION 2 LEVELS;  Surgeon: Hosie Spangle, MD;  Location: [facility];  Service:  Neurosurgery;  Laterality: N/A;  Cervical four-five and Cervical six-seven anterior cervial decompression with fusion plating and bonegraft   ANTERIOR CERVICAL DECOMP/DISCECTOMY FUSION  04-01-2001   '@[facility]'    C5---6   BIOPSY  07/20/2018   Procedure: BIOPSY;  Surgeon: Daneil Dolin, MD;  Location: [facility];  Service: Endoscopy;;  gastric    BREAST REDUCTION SURGERY Bilateral 1995   [place]  x2   last one 1998   with BILATERAL TUBAL LIGATION   CESAREAN SECTION WITH BILATERAL TUBAL LIGATION     COLONOSCOPY WITH PROPOFOL N/A 07/20/2018   Dr. Gala Romney: Diverticulosis, internal hemorrhoids, 5 mm splenic flexure tubular adenoma removed. Repeat in 5 years due to family history.   CYSTOSCOPY/URETEROSCOPY/HOLMIUM LASER/STENT PLACEMENT Left 08/21/2018   Procedure: CYSTOSCOPY LEFT RETROGRADE PYELOGRAM /URETEROSCOPY/HOLMIUM LASER/STENT PLACEMENT;  Surgeon: Ceasar Mons, MD;  Location: [facility];  Service: Urology;  Laterality: Left;   DILATION AND CURETTAGE OF UTERUS  1992   for miscarriage   ENDOMETRIAL ABLATION  2014   ESOPHAGOGASTRODUODENOSCOPY     20 years ago.    ESOPHAGOGASTRODUODENOSCOPY (EGD) WITH PROPOFOL N/A 07/20/2018   Dr. Gala Romney: Small hiatal hernia, erythematous mucosa  in the stomach with a healing gastric ulcer measuring 1 cm, biopsies negative.   ESOPHAGOGASTRODUODENOSCOPY (EGD) WITH PROPOFOL N/A 10/05/2020   Procedure: ESOPHAGOGASTRODUODENOSCOPY (EGD) WITH PROPOFOL;  Surgeon: Daneil Dolin, MD;  [facility]: [facility];  Service: Endoscopy;  Laterality: N/A;   MALONEY DILATION N/A 10/05/2020   Procedure: Venia Minks DILATION;  Surgeon: Daneil Dolin, MD;  Location: [facility];  Service: Endoscopy;  Laterality: N/A;   [place]   unilateral for infertility   NASAL SINUS SURGERY  2010  approx.   POLYPECTOMY  07/20/2018   Procedure: POLYPECTOMY;  Surgeon: Daneil Dolin, MD;  Location: [facility];  Service: Endoscopy;;   POSTERIOR  CERVICAL FUSION/FORAMINOTOMY N/A 08/18/2013   Procedure: CERVICAL FOUR TO CERVICAL SEVEN POSTERIOR CERVICAL FUSION/FORAMINOTOMY LEVEL 3;  Surgeon: Hosie Spangle, MD;  Location: [place] [facility];  Service: Neurosurgery;  Laterality: N/A;  C4-7 posterior cervical fusion with lateral mass fixation   RIGHT/LEFT HEART CATH AND CORONARY ANGIOGRAPHY N/A 12/23/2019   Procedure: RIGHT/LEFT HEART CATH AND CORONARY ANGIOGRAPHY;  Surgeon: Burnell Blanks, MD;  Location: [place] [facility];  Service: Cardiovascular;  Laterality: N/A;    Prior to Admission medications   Medication Sig Start Date End Date Taking? Authorizing Provider  acetaminophen (TYLENOL) 500 MG tablet Take 1,000 mg by mouth every 6 (six) hours as needed for moderate pain.   Yes [provider]  albuterol (VENTOLIN HFA) 108 (90 Base) MCG/ACT inhaler Inhale 2 puffs into the lungs every 6 (six) hours as needed for wheezing or shortness of breath. 10/31/20  Yes Kozlow, Donnamarie Poag, MD  amoxicillin (AMOXIL) 875 MG tablet Take 875 mg by mouth 2 (two) times daily. 11/26/20  Yes [provider]  aspirin EC 81 MG tablet Take 1 tablet (81 mg total) by mouth daily. Swallow whole. 12/17/19  Yes Freada Bergeron, MD  Azelastine-Fluticasone (DYMISTA) 137-50 MCG/ACT SUSP Place 1 spray into both nostrils 2 (two) times daily. 10/31/20  Yes Kozlow, Donnamarie Poag, MD  buPROPion (WELLBUTRIN XL) 150 MG 24 hr tablet Take 3 tablets (450 mg total) by mouth daily. 11/28/20  Yes [name] T, PA-C  cetirizine (ZYRTEC) 10 MG tablet Take 10 mg by mouth daily.   Yes [provider]  clonazePAM (KLONOPIN) 1 MG tablet 1 p.o. 3 times daily as needed anxiety and may repeat 1/2 pill during the middle of the day as needed anxiety.  No more than 3.5 mg/day. 11/28/20  Yes Hurst, Helene Kelp T, PA-C  estradiol (ESTRACE) 1 MG tablet Take 1 mg by mouth daily. 11/07/20  Yes [provider]  ezetimibe (ZETIA) 10 MG tablet Take 1 tablet (10 mg total) by  mouth daily. 09/11/20  Yes Freada Bergeron, MD  fenofibrate (TRICOR) 145 MG tablet Take 1 tablet (145 mg total) by mouth daily. 02/17/20  Yes Freada Bergeron, MD  Fluticasone-Salmeterol,sensor, (AIRDUO DIGIHALER) [phone] MCG/ACT AEPB Inhale 1 puff into the lungs 2 (two) times daily. 10/31/20  Yes Kozlow, Donnamarie Poag, MD  hydrocortisone (ANUSOL-HC) 2.5 % rectal cream Place 1 application rectally 2-4 times daily for rectal bleeding/burning related to hemorrhoids. Patient taking differently: Place 1 application rectally 4 (four) times daily as needed for hemorrhoids. P\ 07/20/20  Yes Aliene Altes S, PA-C  hydrOXYzine (ATARAX/VISTARIL) 10 MG tablet Take 10 mg by mouth 3 (three) times daily as needed for anxiety.   Yes [provider]  ipratropium (ATROVENT) 0.06 % nasal spray Place 2 sprays into both nostrils 3 (three) times  daily. 11/26/20  Yes [provider]  lamoTRIgine (LAMICTAL) 200 MG tablet Take 2.5 tablets (500 mg total) by mouth daily. 11/28/20  Yes Addison Lank, PA-C  levothyroxine (SYNTHROID) 75 MCG tablet Take 75 mcg by mouth daily before breakfast.  05/27/18  Yes [provider]  linaclotide (LINZESS) 290 MCG CAPS capsule Take 290 mcg by mouth daily before breakfast.   Yes [provider]  loperamide (IMODIUM) 2 MG capsule Take 1 capsule (2 mg total) by mouth 4 (four) times daily as needed for diarrhea or loose stools. 10/12/20  Yes Varney Biles, MD  meclizine (ANTIVERT) 25 MG tablet Take 25 mg by mouth 3 (three) times daily as needed for dizziness.   Yes [provider]  metFORMIN (GLUCOPHAGE) 500 MG tablet Take 1 tablet (500 mg total) by mouth 2 (two) times daily with a meal. 11/24/19  Yes Laqueta Linden, MD  montelukast (SINGULAIR) 10 MG tablet Take 10 mg by mouth daily.  10/26/18  Yes [provider]  ondansetron (ZOFRAN) 4 MG tablet Take 1 tablet (4 mg total) by mouth every 6 (six) hours as needed. 06/21/20  Yes Mahala Menghini, PA-C  Oxcarbazepine (TRILEPTAL) 300 MG tablet 1 po q am, 2 po qhs 11/28/20  Yes Hurst, Teresa T, PA-C  pantoprazole (PROTONIX) 40 MG tablet Take 1 tablet (40 mg total) by mouth 2 (two) times daily with a meal. 07/11/20  Yes Annitta Needs, NP  polyethylene glycol-electrolytes (TRILYTE) 420 g solution Take 4,000 mLs by mouth as directed. 08/15/20  Yes Melisa Donofrio, Cristopher Estimable, MD  predniSONE (DELTASONE) 20 MG tablet Take 20 mg by mouth daily. 11/23/20  Yes [provider]  progesterone (PROMETRIUM) 100 MG capsule Take 100 mg by mouth at bedtime. 07/04/20  Yes [provider]  rosuvastatin (CRESTOR) 5 MG tablet Take 1 tablet (5 mg total) by mouth daily. Patient taking differently: Take 5 mg by mouth at bedtime. 06/12/20  Yes Freada Bergeron, MD  sertraline (ZOLOFT) 100 MG tablet Take 3 tablets (300 mg total) by mouth at bedtime. 11/01/20  Yes Hurst, Teresa T, PA-C  zaleplon (SONATA) 10 MG capsule TAKE 1 CAPSULE BY MOUTH AT BEDTIME AS NEEDED, MAY REPEAT 1 FOR MIDNOCTURNAL AWAKENING AS NEEDED IF THREE HOURS LEFT TO SLEEP 11/28/20  Yes Hurst, Teresa T, PA-C  hydrOXYzine (ATARAX/VISTARIL) 25 MG tablet Take 1 tablet by mouth three times daily as needed 10/10/20   [name] T, PA-C  icosapent Ethyl (VASCEPA) 1 g capsule Take 2 capsules (2 g total) by mouth 2 (two) times daily. 12/17/19   Freada Bergeron, MD    Allergies as of 10/31/2020 - Review Complete 10/31/2020  Allergen Reaction Noted   Iohexol Hives 11/03/2007   Rosuvastatin Other (See Comments) 02/09/2020    Family History  Problem Relation Age of Onset   Asthma Mother    Rheum arthritis Mother    Non-Hodgkin's lymphoma Mother    Congestive Heart Failure Mother    Atrial fibrillation Mother    Diabetes Mother    Hyperlipidemia Mother    Cancer Mother    Colon cancer Father    Multiple myeloma Father    Hypertension Father    Cancer Father    Depression Father    Anxiety disorder Father    Alcoholism Father     Obesity Father    Allergies Other        entire family(mom and dad)   Diabetes Maternal Grandmother    Congestive Heart  Failure Maternal Grandmother    Mental illness Paternal Grandmother    Colon cancer Paternal Grandfather    Bone cancer Paternal Grandfather     Social History   Socioeconomic History   Marital status: Married    Spouse name: Not on file   Number of children: 2   Years of education: Not on file   Highest education level: Not on file  Occupational History   Occupation: disability  Tobacco Use   Smoking status: Former    Packs/day: 1.00    Years: 5.00    Pack years: 5.00    Types: Cigarettes    Quit date: 02/09/2002    Years since quitting: 18.8   Smokeless tobacco: Never  Vaping Use   Vaping Use: Never used  Substance and Sexual Activity   Alcohol use: No   Drug use: No   Sexual activity: Yes    Birth control/protection: Surgical  Other Topics Concern   Not on file  Social History Narrative   Step brother-2   Step sister-2   Half sister -1 healthy   Social Determinants of Health   Financial Resource Strain: Not on file  Food Insecurity: Not on file  Transportation Needs: Not on file  Physical Activity: Not on file  Stress: Not on file  Social Connections: Not on file  Intimate Partner Violence: Not on file    Review of Systems: See HPI, otherwise negative ROS  Physical Exam: BP 113/71   Temp 98.2 F (36.8 C) (Oral)   Resp 20   SpO2 96%  General:   Alert,  Well-developed, well-nourished, pleasant and cooperative in NAD Neck:  Supple; no masses or thyromegaly. No significant cervical adenopathy. Lungs:  Clear throughout to auscultation.   No wheezes, crackles, or rhonchi. No acute distress. Heart:  Regular rate and rhythm; no murmurs, clicks, rubs,  or gallops. Abdomen: Non-distended, normal bowel sounds.  Soft and nontender without appreciable mass or hepatosplenomegaly.  Pulses:  Normal pulses noted. Extremities:  Without clubbing  or edema.  Impression/Plan:    50 year old lady here for diagnostic colonoscopy.  History of diverticulitis/abnormal CT earlier this year.  Personal history colonic adenoma.  Clinically, now doing well from a GI standpoint.    I have offered the patient a diagnostic colonoscopy today per plan. The risks, benefits, limitations, alternatives and imponderables have been reviewed with the patient. Questions have been answered. All parties are agreeable.       Notice: This dictation was prepared with Dragon dictation along with smaller phrase technology. Any transcriptional errors that result from this process are unintentional and may not be corrected upon review.

## 2020-12-07 NOTE — Discharge Instructions (Addendum)
  Colonoscopy Discharge Instructions  Read the instructions outlined below and refer to this sheet in the next few weeks. These discharge instructions provide you with general information on caring for yourself after you leave the hospital. Your doctor may also give you specific instructions. While your treatment has been planned according to the most current medical practices available, unavoidable complications occasionally occur. If you have any problems or questions after discharge, call Dr. Gala Romney at [phone]. ACTIVITY You may resume your regular activity, but move at a slower pace for the next 24 hours.  Take frequent rest periods for the next 24 hours.  Walking will help get rid of the air and reduce the bloated feeling in your belly (abdomen).  No driving for 24 hours (because of the medicine (anesthesia) used during the test).   Do not sign any important legal documents or operate any machinery for 24 hours (because of the anesthesia used during the test).  NUTRITION Drink plenty of fluids.  You may resume your normal diet as instructed by your doctor.  Begin with a light meal and progress to your normal diet. Heavy or fried foods are harder to digest and may make you feel sick to your stomach (nauseated).  Avoid alcoholic beverages for 24 hours or as instructed.  MEDICATIONS You may resume your normal medications unless your doctor tells you otherwise.  WHAT YOU CAN EXPECT TODAY Some feelings of bloating in the abdomen.  Passage of more gas than usual.  Spotting of blood in your stool or on the toilet paper.  IF YOU HAD POLYPS REMOVED DURING THE COLONOSCOPY: No aspirin products for 7 days or as instructed.  No alcohol for 7 days or as instructed.  Eat a soft diet for the next 24 hours.  FINDING OUT THE RESULTS OF YOUR TEST Not all test results are available during your visit. If your test results are not back during the visit, make an appointment with your caregiver to find out the  results. Do not assume everything is normal if you have not heard from your caregiver or the medical facility. It is important for you to follow up on all of your test results.  SEEK IMMEDIATE MEDICAL ATTENTION IF: You have more than a spotting of blood in your stool.  Your belly is swollen (abdominal distention).  You are nauseated or vomiting.  You have a temperature over 101.  You have abdominal pain or discomfort that is severe or gets worse throughout the day.     Diverticulosis only found today (no polyps)   I recommend a repeat colonoscopy in 7 years   office visit with [place] in 3 months   at patient request I called Michael at [phone] and reviewed results

## 2020-12-07 NOTE — Anesthesia Preprocedure Evaluation (Signed)
Anesthesia Evaluation  Patient identified by MRN, date of birth, ID band Patient awake    Reviewed: Allergy & Precautions, NPO status , Patient's Chart, lab work & pertinent test results  History of Anesthesia Complications (+) PONV, Family history of anesthesia reaction and history of anesthetic complications  Airway Mallampati: III  TM Distance: >3 FB Neck ROM: Full    Dental  (+) Dental Advisory Given, Teeth Intact   Pulmonary shortness of breath and with exertion, sleep apnea , former smoker,    Pulmonary exam normal breath sounds clear to auscultation       Cardiovascular negative cardio ROS Normal cardiovascular exam Rhythm:Regular Rate:Normal  ? The left ventricular systolic function is normal. ? LV end diastolic pressure is normal. ? The left ventricular ejection fraction is greater than 65% by visual estimate. ? There is no mitral valve regurgitation.   No angiographic evidence of CAD Hyperdynamic LV systolic function Normal right and left heart pressures  Recommendations: No further ischemic workup.      Neuro/Psych PSYCHIATRIC DISORDERS Anxiety Depression Bipolar Disorder negative neurological ROS     GI/Hepatic hiatal hernia, GERD  Medicated and Controlled,Fatty liver    Endo/Other  diabetes, Well Controlled, Type 2Hypothyroidism   Renal/GU Renal disease  negative genitourinary   Musculoskeletal  (+) Arthritis , Osteoarthritis,    Abdominal   Peds negative pediatric ROS (+)  Hematology negative hematology ROS (+) anemia ,   Anesthesia Other Findings Sinusitis and laryngitis   Reproductive/Obstetrics negative OB ROS                            Anesthesia Physical Anesthesia Plan  ASA: 3  Anesthesia Plan: General   Post-op Pain Management:    Induction: Intravenous  PONV Risk Score and Plan: TIVA  Airway Management Planned: Nasal Cannula and Natural  Airway  Additional Equipment:   Intra-op Plan:   Post-operative Plan:   Informed Consent: I have reviewed the patients History and Physical, chart, labs and discussed the procedure including the risks, benefits and alternatives for the proposed anesthesia with the patient or authorized representative who has indicated his/her understanding and acceptance.     Dental advisory given  Plan Discussed with: CRNA and Surgeon  Anesthesia Plan Comments:         Anesthesia Quick Evaluation

## 2020-12-11 ENCOUNTER — Encounter (HOSPITAL_COMMUNITY): Payer: Self-pay | Admitting: Internal Medicine

## 2020-12-14 ENCOUNTER — Telehealth: Payer: 59 | Admitting: Cardiology

## 2020-12-27 ENCOUNTER — Encounter (HOSPITAL_COMMUNITY): Payer: Self-pay

## 2020-12-27 ENCOUNTER — Emergency Department (HOSPITAL_COMMUNITY)
Admission: EM | Admit: 2020-12-27 | Discharge: 2020-12-27 | Disposition: A | Discharge status: Home / Self Care | Payer: 59 | Attending: Emergency Medicine | Admitting: Emergency Medicine

## 2020-12-27 DIAGNOSIS — E119 Type 2 diabetes mellitus without complications: Secondary | ICD-10-CM | POA: Diagnosis not present

## 2020-12-27 DIAGNOSIS — L0231 Cutaneous abscess of buttock: Secondary | ICD-10-CM | POA: Diagnosis not present

## 2020-12-27 DIAGNOSIS — Z79899 Other long term (current) drug therapy: Secondary | ICD-10-CM | POA: Diagnosis not present

## 2020-12-27 DIAGNOSIS — Z87891 Personal history of nicotine dependence: Secondary | ICD-10-CM | POA: Diagnosis not present

## 2020-12-27 DIAGNOSIS — E039 Hypothyroidism, unspecified: Secondary | ICD-10-CM | POA: Diagnosis not present

## 2020-12-27 DIAGNOSIS — Z7982 Long term (current) use of aspirin: Secondary | ICD-10-CM | POA: Insufficient documentation

## 2020-12-27 DIAGNOSIS — Z7984 Long term (current) use of oral hypoglycemic drugs: Secondary | ICD-10-CM | POA: Diagnosis not present

## 2020-12-27 MED ORDER — OXYCODONE-ACETAMINOPHEN 5-325 MG PO TABS: 1.0000 | ORAL_TABLET | Freq: Four times a day (QID) | ORAL | 0 refills | Status: DC | PRN

## 2020-12-27 NOTE — ED Provider Notes (Signed)
[place] Provider Note   CSN: [id number] Arrival date & time: 12/27/20  1127     History No chief complaint on file.   Kristen [name] is a 50 y.o. female.  50 year old female presents with complaint of abscess to the left buttocks.  Patient first noticed the area 1 week ago on Thursday.  Patient went to urgent care on Sunday where she had I&D and was started on Bactrim and given injection of Rocephin.  Patient developed a fever of 102.5 on Sunday which seemed to spontaneously resolve.  Followed up with PCP on Monday where she was given medication for vertigo although does not feel that this was what was causing her to feel so unwell.  Patient returned to urgent care yesterday where she had a second I&D and was given additional injections of Rocephin.  Patient has packing in place, feels like area is spreading more medially.  Does have history of MRSA infection several years ago to her C-section site.      Past Medical History:  Diagnosis Date   Allergies    Anemia    Anxiety    Arthritis    Atypical mole 11/03/2006   mid upper back (slight to moderate)   Atypical mole 11/03/2006   lower right back (slight to moderate)   Back pain    Bipolar 1 disorder (HCC)    Borderline diabetic    Chronic constipation    Constipation    Depression    Family history of adverse reaction to anesthesia    mother-- ponv   Fatty liver    GAD (generalized anxiety disorder)    GERD (gastroesophageal reflux disease)    Hiatal hernia    High triglycerides    History of kidney stones    History of recurrent UTIs    History of sepsis 06/2018   History of stomach ulcers    History of suicidal ideation    Hyperlipidemia    Hypothyroidism    IDA (iron deficiency anemia)    Joint pain    Melanoma ([place]) 11/03/2006   left chest in situ (excision)   Orthostasis    Palpitations    PONV (postoperative nausea and vomiting)    Prediabetes    Rapid heart rate    Renal  calculus, left    Seasonal allergies    Shortness of breath    Sleep apnea    Sleep disorder, unspecified    per excessive sleeping during the day   Snoring    Swallowing difficulty    Tachycardia    Vertigo    Vitamin D deficiency    Weakness    Wears contact lenses     Patient Active Problem List   Diagnosis Date Noted   Periumbilical abdominal pain 07/31/2020   Change in bowel habits 07/31/2020   Hypertriglyceridemia 01/10/2020   Shortness of breath    Leukocytosis 06/10/2019   Nausea without vomiting 04/19/2019   Dysphagia 02/08/2019   Hoarseness 02/08/2019   Chronic constipation    GERD (gastroesophageal reflux disease)    Bloating    Family history of colon cancer in father    Dysphonia 11/19/2018   Laryngopharyngeal reflux (LPR) 11/19/2018   Anemia    AKI (acute kidney injury) ([place]) 07/10/2018   Lithium toxicity 07/10/2018   Encephalopathy 07/10/2018   Acute urinary retention 07/10/2018   Hypothyroidism due to medication 06/03/2018   GAD (generalized anxiety disorder) 12/05/2017   Tremor 12/05/2017   Insomnia 12/05/2017  Bipolar I disorder ([place]) 12/05/2017   Obesity, Class II, BMI 35-39.9, with comorbidity 12/06/2015   Anxiety 11/15/2015   Fatty liver 11/15/2015   History of kidney stones 11/15/2015   Other hyperlipidemia 11/15/2015   Type 2 diabetes mellitus without complication ([place]) 94/76/5465   Pseudoarthrosis of cervical spine ([place]) 08/18/2013   Status post cervical spinal arthrodesis 08/18/2013   Sleep disorder 07/20/2012   Chronic rhinitis 07/20/2012    Past Surgical History:  Procedure Laterality Date   ANTERIOR CERVICAL DECOMP/DISCECTOMY FUSION  02/17/2012   Procedure: ANTERIOR CERVICAL DECOMPRESSION/DISCECTOMY FUSION 2 LEVELS;  Surgeon: Hosie Spangle, MD;  Location: [facility];  Service: Neurosurgery;  Laterality: N/A;  Cervical four-five and Cervical six-seven anterior cervial decompression with fusion plating and bonegraft   ANTERIOR  CERVICAL DECOMP/DISCECTOMY FUSION  04-01-2001   _0    C5---6   BIOPSY  07/20/2018   Procedure: BIOPSY;  Surgeon: Daneil Dolin, MD;  [facility];  Service: Endoscopy;;  gastric    BREAST REDUCTION SURGERY Bilateral 1995   [place]  x2   last one 1998   with BILATERAL TUBAL LIGATION   CESAREAN SECTION WITH BILATERAL TUBAL LIGATION     COLONOSCOPY WITH PROPOFOL N/A 07/20/2018   Dr. Gala Romney: Diverticulosis, internal hemorrhoids, 5 mm splenic flexure tubular adenoma removed. Repeat in 5 years due to family history.   COLONOSCOPY WITH PROPOFOL N/A 12/07/2020   Procedure: COLONOSCOPY WITH PROPOFOL;  Surgeon: Daneil Dolin, MD;  [facility];  Service: Endoscopy;  Laterality: N/A;  2:30pm   CYSTOSCOPY/URETEROSCOPY/HOLMIUM LASER/STENT PLACEMENT Left 08/21/2018   Procedure: CYSTOSCOPY LEFT RETROGRADE PYELOGRAM /URETEROSCOPY/HOLMIUM LASER/STENT PLACEMENT;  Surgeon: Ceasar Mons, MD;  Location: [facility];  Service: Urology;  Laterality: Left;   DILATION AND CURETTAGE OF UTERUS  1992   for miscarriage   ENDOMETRIAL ABLATION  2014   ESOPHAGOGASTRODUODENOSCOPY     20 years ago.    ESOPHAGOGASTRODUODENOSCOPY (EGD) WITH PROPOFOL N/A 07/20/2018   Dr. Gala Romney: Small hiatal hernia, erythematous mucosa in the stomach with a healing gastric ulcer measuring 1 cm, biopsies negative.   ESOPHAGOGASTRODUODENOSCOPY (EGD) WITH PROPOFOL N/A 10/05/2020   Procedure: ESOPHAGOGASTRODUODENOSCOPY (EGD) WITH PROPOFOL;  Surgeon: Daneil Dolin, MD;  [facility];  Service: Endoscopy;  Laterality: N/A;   MALONEY DILATION N/A 10/05/2020   Procedure: Venia Minks DILATION;  Surgeon: Daneil Dolin, MD;  [facility];  Service: Endoscopy;  Laterality: N/A;   [place]   unilateral for infertility   NASAL SINUS SURGERY  2010  approx.   POLYPECTOMY  07/20/2018   Procedure: POLYPECTOMY;  Surgeon: Daneil Dolin, MD;  [facility];  Service: Endoscopy;;   POSTERIOR CERVICAL FUSION/FORAMINOTOMY N/A 08/18/2013   Procedure: CERVICAL FOUR TO CERVICAL SEVEN POSTERIOR CERVICAL FUSION/FORAMINOTOMY LEVEL 3;  Surgeon: Hosie Spangle, MD;  Location: [facility];  Service: Neurosurgery;  Laterality: N/A;  C4-7 posterior cervical fusion with lateral mass fixation   RIGHT/LEFT HEART CATH AND CORONARY ANGIOGRAPHY N/A 12/23/2019   Procedure: RIGHT/LEFT HEART CATH AND CORONARY ANGIOGRAPHY;  Surgeon: Burnell Blanks, MD;  Location: [facility];  Service: Cardiovascular;  Laterality: N/A;     OB History     Gravida  3   Para      Term      Preterm      AB      Living         SAB  IAB      Ectopic      Multiple      Live Births              Family History  Problem Relation Age of Onset   Asthma Mother    Rheum arthritis Mother    Non-Hodgkin's lymphoma Mother    Congestive Heart Failure Mother    Atrial fibrillation Mother    Diabetes Mother    Hyperlipidemia Mother    Cancer Mother    Colon cancer Father    Multiple myeloma Father    Hypertension Father    Cancer Father    Depression Father    Anxiety disorder Father    Alcoholism Father    Obesity Father    Allergies Other        entire family(mom and dad)   Diabetes Maternal Grandmother    Congestive Heart Failure Maternal Grandmother    Mental illness Paternal Grandmother    Colon cancer Paternal Grandfather    Bone cancer Paternal Grandfather     Social History   Tobacco Use   Smoking status: Former    Packs/day: 1.00    Years: 5.00    Pack years: 5.00    Types: Cigarettes    Quit date: 02/09/2002    Years since quitting: 18.8   Smokeless tobacco: Never  Vaping Use   Vaping Use: Never used  Substance Use Topics   Alcohol use: No   Drug use: No    Home Medications Prior to Admission medications   Medication Sig Start Date End Date Taking? Authorizing Provider  oxyCODONE-acetaminophen  (PERCOCET/ROXICET) 5-325 MG tablet Take 1 tablet by mouth every 6 (six) hours as needed for severe pain. 12/27/20  Yes Tacy Learn, PA-C  acetaminophen (TYLENOL) 500 MG tablet Take 1,000 mg by mouth every 6 (six) hours as needed for moderate pain.    [provider]  albuterol (VENTOLIN HFA) 108 (90 Base) MCG/ACT inhaler Inhale 2 puffs into the lungs every 6 (six) hours as needed for wheezing or shortness of breath. 10/31/20   Kozlow, Donnamarie Poag, MD  amoxicillin (AMOXIL) 875 MG tablet Take 875 mg by mouth 2 (two) times daily. 11/26/20   [provider]  aspirin EC 81 MG tablet Take 1 tablet (81 mg total) by mouth daily. Swallow whole. 12/17/19   Freada Bergeron, MD  Azelastine-Fluticasone (DYMISTA) 137-50 MCG/ACT SUSP Place 1 spray into both nostrils 2 (two) times daily. 10/31/20   Kozlow, Donnamarie Poag, MD  buPROPion (WELLBUTRIN XL) 150 MG 24 hr tablet Take 3 tablets (450 mg total) by mouth daily. 11/28/20   [name] T, PA-C  cetirizine (ZYRTEC) 10 MG tablet Take 10 mg by mouth daily.    [provider]  clonazePAM (KLONOPIN) 1 MG tablet 1 p.o. 3 times daily as needed anxiety and may repeat 1/2 pill during the middle of the day as needed anxiety.  No more than 3.5 mg/day. 11/28/20   [name] T, PA-C  estradiol (ESTRACE) 1 MG tablet Take 1 mg by mouth daily. 11/07/20   [provider]  ezetimibe (ZETIA) 10 MG tablet Take 1 tablet (10 mg total) by mouth daily. 09/11/20   Freada Bergeron, MD  fenofibrate (TRICOR) 145 MG tablet Take 1 tablet (145 mg total) by mouth daily. 02/17/20   Freada Bergeron, MD  Fluticasone-Salmeterol,sensor, (AIRDUO DIGIHALER) 9523053160 MCG/ACT AEPB Inhale 1 puff into the lungs 2 (two) times daily. 10/31/20   Kozlow, Donnamarie Poag,  MD  hydrocortisone (ANUSOL-HC) 2.5 % rectal cream Place 1 application rectally 2-4 times daily for rectal bleeding/burning related to hemorrhoids. Patient taking differently: Place 1 application rectally 4 (four)  times daily as needed for hemorrhoids. P\ 07/20/20   Erenest Rasher, PA-C  hydrOXYzine (ATARAX/VISTARIL) 10 MG tablet Take 10 mg by mouth 3 (three) times daily as needed for anxiety.    [provider]  hydrOXYzine (ATARAX/VISTARIL) 25 MG tablet Take 1 tablet by mouth three times daily as needed 10/10/20   [name] T, PA-C  icosapent Ethyl (VASCEPA) 1 g capsule Take 2 capsules (2 g total) by mouth 2 (two) times daily. 12/17/19   Freada Bergeron, MD  ipratropium (ATROVENT) 0.06 % nasal spray Place 2 sprays into both nostrils 3 (three) times daily. 11/26/20   [provider]  lamoTRIgine (LAMICTAL) 200 MG tablet Take 2.5 tablets (500 mg total) by mouth daily. 11/28/20   Addison Lank, PA-C  levothyroxine (SYNTHROID) 75 MCG tablet Take 75 mcg by mouth daily before breakfast.  05/27/18   [provider]  linaclotide (LINZESS) 290 MCG CAPS capsule Take 290 mcg by mouth daily before breakfast.    [provider]  loperamide (IMODIUM) 2 MG capsule Take 1 capsule (2 mg total) by mouth 4 (four) times daily as needed for diarrhea or loose stools. 10/12/20   Varney Biles, MD  meclizine (ANTIVERT) 25 MG tablet Take 25 mg by mouth 3 (three) times daily as needed for dizziness.    [provider]  metFORMIN (GLUCOPHAGE) 500 MG tablet Take 1 tablet (500 mg total) by mouth 2 (two) times daily with a meal. 11/24/19   Laqueta Linden, MD  montelukast (SINGULAIR) 10 MG tablet Take 10 mg by mouth daily.  10/26/18   [provider]  ondansetron (ZOFRAN) 4 MG tablet Take 1 tablet (4 mg total) by mouth every 6 (six) hours as needed. 06/21/20   Mahala Menghini, PA-C  Oxcarbazepine (TRILEPTAL) 300 MG tablet 1 po q am, 2 po qhs 11/28/20   Hurst, Teresa T, PA-C  pantoprazole (PROTONIX) 40 MG tablet Take 1 tablet (40 mg total) by mouth 2 (two) times daily with a meal. 07/11/20   Annitta Needs, NP  polyethylene glycol-electrolytes (TRILYTE) 420 g solution Take  4,000 mLs by mouth as directed. 08/15/20   Rourk, Cristopher Estimable, MD  predniSONE (DELTASONE) 20 MG tablet Take 20 mg by mouth daily. 11/23/20   [provider]  progesterone (PROMETRIUM) 100 MG capsule Take 100 mg by mouth at bedtime. 07/04/20   [provider]  rosuvastatin (CRESTOR) 5 MG tablet Take 1 tablet (5 mg total) by mouth daily. Patient taking differently: Take 5 mg by mouth at bedtime. 06/12/20   Freada Bergeron, MD  sertraline (ZOLOFT) 100 MG tablet Take 3 tablets (300 mg total) by mouth at bedtime. 11/01/20   [name] T, PA-C  zaleplon (SONATA) 10 MG capsule TAKE 1 CAPSULE BY MOUTH AT BEDTIME AS NEEDED, MAY REPEAT 1 FOR MIDNOCTURNAL AWAKENING AS NEEDED IF THREE HOURS LEFT TO SLEEP 11/28/20   [name] T, PA-C    Allergies    Iohexol and Rosuvastatin  Review of Systems   Review of Systems  Constitutional:  Negative for fever.  Gastrointestinal:  Negative for abdominal pain.  Musculoskeletal:  Negative for arthralgias and myalgias.  Skin:  Positive for wound.  Allergic/Immunologic: Negative for immunocompromised state.  Neurological:  Negative for weakness.  Hematological:  Negative for adenopathy.  Psychiatric/Behavioral:  Negative for confusion.   All other systems reviewed and are negative.  Physical Exam Updated Vital Signs BP 131/76 (BP Location: Right Arm)   Pulse 88   Temp 98.4 F (36.9 C) (Oral)   Resp 20   SpO2 99%   Physical Exam Vitals and nursing note reviewed.  Constitutional:      General: She is not in acute distress.    Appearance: She is well-developed. She is not diaphoretic.  HENT:     Head: Normocephalic and atraumatic.  Cardiovascular:     Pulses: Normal pulses.  Pulmonary:     Effort: Pulmonary effort is normal.  Musculoskeletal:        General: Normal range of motion.  Skin:    General: Skin is warm and dry.     Findings: Erythema present.     Comments: Area of erythema with induration to left lower buttock area,  I&D with packing in place.  Neurological:     Mental Status: She is alert and oriented to person, place, and time.  Psychiatric:        Behavior: Behavior normal.    ED Results / Procedures / Treatments   Labs (all labs ordered are listed, but only abnormal results are displayed) Labs Reviewed - No data to display  EKG None  Radiology No results found.  Procedures Procedures   Medications Ordered in ED Medications - No data to display  ED Course  I have reviewed the triage vital signs and the nursing notes.  Pertinent labs & imaging results that were available during my care of the patient were reviewed by me and considered in my medical decision making (see chart for details).  Clinical Course as of 12/27/20 1555  Wed Nov 16, 625  [id number] 50 year old female presents the ER with concern for abscess to left buttocks as described above.  She is found to have erythema with induration to the buttocks, extending medially, no further fluctuance.  Packing is removed with trace amount of purulent drainage present.  Area is reviewed with ultrasound assistance, no further loculations evident at this time. Patient's husband is at bedside and assists with exam noting that the redness seems to be improving superior and laterally to the initial I&D site. Patient is afebrile.  Has been treated with Bactrim as well as doses of Rocephin on Sunday and yesterday.  Offered reassurance that there are no further loculations visible on ultrasound at this time, patient is being treated with Bactrim which generally does cover MRSA infections.  Patient is pending cultures that were obtained through the [facility] system urgent care.  Care everywhere records reviewed, no results for this test as of yet. Packing was replaced today.  Recommend continue with her Bactrim, apply warm compresses for 20 minutes at a time to the area.  If area with significant improvement in the next 2 days, may remove packing.  If there are  any concerns, recommend that she follow-up with general surgery for packing removal and recheck Friday.  Return to the ER at anytime for worsening or concerning symptoms including worsening area of redness, swelling, fever or any other concerns. Care everywhere notes that a prescription for Percocet was sent to patient's pharmacy.  Patient went to her pharmacy and this prescription is not available.  A new prescription for Percocet was issued. [LM]    Clinical Course User Index [LM] Roque Lias   MDM Rules/Calculators/A&P  Final Clinical Impression(s) / ED Diagnoses Final diagnoses:  Abscess of left buttock    Rx / DC Orders ED Discharge Orders          Ordered    oxyCODONE-acetaminophen (PERCOCET/ROXICET) 5-325 MG tablet  Every 6 hours PRN        12/27/20 1458             Tacy Learn, PA-C 12/27/20 1556    Truddie Hidden, MD 12/27/20 2029

## 2020-12-27 NOTE — ED Triage Notes (Signed)
Pt reports an abscess left buttocks since Thursday, went to urgent care on Sunday and  was put on Bactrim, pt says Monday PCP and urgent care are not fixing it.  She is in a lot of pain when she sits, wound is packed at this time.

## 2020-12-27 NOTE — Discharge Instructions (Signed)
Warm compresses to area for 20 minutes at a time.  Continue with your antibiotics.  Return to the emergency room for return of fever, worsening or concerning symptoms.  Packing removal in 2 days, this can be done at home if the area appears to be improving or can be done at the general surgeons office, call to schedule an appointment.

## 2020-12-28 ENCOUNTER — Other Ambulatory Visit: Payer: Self-pay

## 2020-12-28 ENCOUNTER — Ambulatory Visit (INDEPENDENT_AMBULATORY_CARE_PROVIDER_SITE_OTHER): Payer: 59 | Admitting: General Surgery

## 2020-12-28 ENCOUNTER — Encounter: Payer: Self-pay | Admitting: General Surgery

## 2020-12-28 VITALS — BP 119/78 | HR 79 | Temp 98.3°F | Resp 18 | Ht 66.0 in | Wt 240.0 lb

## 2020-12-28 DIAGNOSIS — L0231 Cutaneous abscess of buttock: Secondary | ICD-10-CM

## 2020-12-28 NOTE — Progress Notes (Signed)
[facility] History and Physical  Reason for Referral: Perianal abscess  Referring Physician: ED  Chief Complaint   New Patient (Initial Visit)     Kristen [name] is a 50 y.o. female.  HPI: Kristen [name] is a 50 yo with perianal pain and recent I&D in the urgent care and check in the Ed with packing removal. She says she started having swelling and pain about 5 days prior. She is still tender. The packing has fallen out overnight from the ED. She denies any fevers. She had MRSA and an abscess in her Csection incision in the past. She has not had any perianal issues before.   She still tender in the area. She has noticed some yellow drainage. She is on antibiotics.   Past Medical History:  Diagnosis Date   Allergies    Anemia    Anxiety    Arthritis    Atypical mole 11/03/2006   mid upper back (slight to moderate)   Atypical mole 11/03/2006   lower right back (slight to moderate)   Back pain    Bipolar 1 disorder (HCC)    Borderline diabetic    Chronic constipation    Constipation    Depression    Family history of adverse reaction to anesthesia    mother-- ponv   Fatty liver    GAD (generalized anxiety disorder)    GERD (gastroesophageal reflux disease)    Hiatal hernia    High triglycerides    History of kidney stones    History of recurrent UTIs    History of sepsis 06/2018   History of stomach ulcers    History of suicidal ideation    Hyperlipidemia    Hypothyroidism    IDA (iron deficiency anemia)    Joint pain    Melanoma ([place]) 11/03/2006   left chest in situ (excision)   Orthostasis    Palpitations    PONV (postoperative nausea and vomiting)    Prediabetes    Rapid heart rate    Renal calculus, left    Seasonal allergies    Shortness of breath    Sleep apnea    Sleep disorder, unspecified    per excessive sleeping during the day   Snoring    Swallowing difficulty    Tachycardia    Vertigo    Vitamin D deficiency    Weakness     Wears contact lenses     Past Surgical History:  Procedure Laterality Date   ANTERIOR CERVICAL DECOMP/DISCECTOMY FUSION  02/17/2012   Procedure: ANTERIOR CERVICAL DECOMPRESSION/DISCECTOMY FUSION 2 LEVELS;  Surgeon: Hosie Spangle, MD;  Location: [facility];  Service: Neurosurgery;  Laterality: N/A;  Cervical four-five and Cervical six-seven anterior cervial decompression with fusion plating and bonegraft   ANTERIOR CERVICAL DECOMP/DISCECTOMY FUSION  04-01-2001   _0    C5---6   BIOPSY  07/20/2018   Procedure: BIOPSY;  Surgeon: Daneil Dolin, MD;  Location: [facility];  Service: Endoscopy;;  gastric    BREAST REDUCTION SURGERY Bilateral 1995   [place]  x2   last one 1998   with BILATERAL TUBAL LIGATION   CESAREAN SECTION WITH BILATERAL TUBAL LIGATION     COLONOSCOPY WITH PROPOFOL N/A 07/20/2018   Dr. Gala Romney: Diverticulosis, internal hemorrhoids, 5 mm splenic flexure tubular adenoma removed. Repeat in 5 years due to family history.   COLONOSCOPY WITH PROPOFOL N/A 12/07/2020   Procedure: COLONOSCOPY WITH PROPOFOL;  Surgeon: Daneil Dolin, MD;  [facility]: [facility];  Service: Endoscopy;  Laterality: N/A;  2:30pm   CYSTOSCOPY/URETEROSCOPY/HOLMIUM LASER/STENT PLACEMENT Left 08/21/2018   Procedure: CYSTOSCOPY LEFT RETROGRADE PYELOGRAM /URETEROSCOPY/HOLMIUM LASER/STENT PLACEMENT;  Surgeon: Ceasar Mons, MD;  Location: [facility];  Service: Urology;  Laterality: Left;   DILATION AND CURETTAGE OF UTERUS  1992   for miscarriage   ENDOMETRIAL ABLATION  2014   ESOPHAGOGASTRODUODENOSCOPY     20 years ago.    ESOPHAGOGASTRODUODENOSCOPY (EGD) WITH PROPOFOL N/A 07/20/2018   Dr. Gala Romney: Small hiatal hernia, erythematous mucosa in the stomach with a healing gastric ulcer measuring 1 cm, biopsies negative.   ESOPHAGOGASTRODUODENOSCOPY (EGD) WITH PROPOFOL N/A 10/05/2020   Procedure: ESOPHAGOGASTRODUODENOSCOPY (EGD) WITH PROPOFOL;  Surgeon: Daneil Dolin,  MD;  [facility];  Service: Endoscopy;  Laterality: N/A;   MALONEY DILATION N/A 10/05/2020   Procedure: Venia Minks DILATION;  Surgeon: Daneil Dolin, MD;  [facility];  Service: Endoscopy;  Laterality: N/A;   [place]   unilateral for infertility   NASAL SINUS SURGERY  2010  approx.   POLYPECTOMY  07/20/2018   Procedure: POLYPECTOMY;  Surgeon: Daneil Dolin, MD;  [facility];  Service: Endoscopy;;   POSTERIOR CERVICAL FUSION/FORAMINOTOMY N/A 08/18/2013   Procedure: CERVICAL FOUR TO CERVICAL SEVEN POSTERIOR CERVICAL FUSION/FORAMINOTOMY LEVEL 3;  Surgeon: Hosie Spangle, MD;  Location: [place] [facility];  Service: Neurosurgery;  Laterality: N/A;  C4-7 posterior cervical fusion with lateral mass fixation   RIGHT/LEFT HEART CATH AND CORONARY ANGIOGRAPHY N/A 12/23/2019   Procedure: RIGHT/LEFT HEART CATH AND CORONARY ANGIOGRAPHY;  Surgeon: Burnell Blanks, MD;  Location: [facility];  Service: Cardiovascular;  Laterality: N/A;    Family History  Problem Relation Age of Onset   Asthma Mother    Rheum arthritis Mother    Non-Hodgkin's lymphoma Mother    Congestive Heart Failure Mother    Atrial fibrillation Mother    Diabetes Mother    Hyperlipidemia Mother    Cancer Mother    Colon cancer Father    Multiple myeloma Father    Hypertension Father    Cancer Father    Depression Father    Anxiety disorder Father    Alcoholism Father    Obesity Father    Allergies Other        entire family(mom and dad)   Diabetes Maternal Grandmother    Congestive Heart Failure Maternal Grandmother    Mental illness Paternal Grandmother    Colon cancer Paternal Grandfather    Bone cancer Paternal Grandfather     Social History   Tobacco Use   Smoking status: Former    Packs/day: 1.00    Years: 5.00    Pack years: 5.00    Types: Cigarettes    Quit date: 02/09/2002    Years since quitting: 18.9   Smokeless tobacco: Never  Vaping Use    Vaping Use: Never used  Substance Use Topics   Alcohol use: No   Drug use: No    Medications: I have reviewed the patient's current medications. Allergies as of 12/28/2020       Reactions   Iohexol Hives    Desc: IVP DYE   Rosuvastatin Other (See Comments)   Muscle aches with 29m and 2223m tolerates 23m47maily without issues        Medication List        Accurate as of December 28, 2020 11:59 PM. If you have any questions, ask your nurse or  doctor.          STOP taking these medications    amoxicillin 875 MG tablet Commonly known as: AMOXIL Stopped by: Jaimin Krupka C Rashidi Loh, MD   predniSONE 20 MG tablet Commonly known as: DELTASONE Stopped by: Kaarin Pardy C Belem Hintze, MD       TAKE these medications    acetaminophen 500 MG tablet Commonly known as: TYLENOL Take 1,000 mg by mouth every 6 (six) hours as needed for moderate pain.   AirDuo Digihaler 232-14 MCG/ACT Aepb Generic drug: Fluticasone-Salmeterol(sensor) Inhale 1 puff into the lungs 2 (two) times daily.   albuterol 108 (90 Base) MCG/ACT inhaler Commonly known as: VENTOLIN HFA Inhale 2 puffs into the lungs every 6 (six) hours as needed for wheezing or shortness of breath.   aspirin EC 81 MG tablet Take 1 tablet (81 mg total) by mouth daily. Swallow whole.   Azelastine-Fluticasone 137-50 MCG/ACT Susp Commonly known as: Dymista Place 1 spray into both nostrils 2 (two) times daily.   buPROPion 150 MG 24 hr tablet Commonly known as: WELLBUTRIN XL Take 3 tablets (450 mg total) by mouth daily.   cetirizine 10 MG tablet Commonly known as: ZYRTEC Take 10 mg by mouth daily.   clonazePAM 1 MG tablet Commonly known as: KlonoPIN 1 p.o. 3 times daily as needed anxiety and may repeat 1/2 pill during the middle of the day as needed anxiety.  No more than 3.5 mg/day.   estradiol 1 MG tablet Commonly known as: ESTRACE Take 1 mg by mouth daily.   ezetimibe 10 MG tablet Commonly known as: ZETIA Take 1 tablet  (10 mg total) by mouth daily.   fenofibrate 145 MG tablet Commonly known as: Tricor Take 1 tablet (145 mg total) by mouth daily.   hydrocortisone 2.5 % rectal cream Commonly known as: ANUSOL-HC Place 1 application rectally 2-4 times daily for rectal bleeding/burning related to hemorrhoids. What changed:  how much to take how to take this when to take this reasons to take this additional instructions   hydrOXYzine 10 MG tablet Commonly known as: ATARAX/VISTARIL Take 10 mg by mouth 3 (three) times daily as needed for anxiety.   hydrOXYzine 25 MG tablet Commonly known as: ATARAX/VISTARIL Take 1 tablet by mouth three times daily as needed   icosapent Ethyl 1 g capsule Commonly known as: Vascepa Take 2 capsules (2 g total) by mouth 2 (two) times daily.   ipratropium 0.06 % nasal spray Commonly known as: ATROVENT Place 2 sprays into both nostrils 3 (three) times daily.   lamoTRIgine 200 MG tablet Commonly known as: LAMICTAL Take 2.5 tablets (500 mg total) by mouth daily.   levothyroxine 75 MCG tablet Commonly known as: SYNTHROID Take 75 mcg by mouth daily before breakfast.   linaclotide 290 MCG Caps capsule Commonly known as: LINZESS Take 290 mcg by mouth daily before breakfast.   loperamide 2 MG capsule Commonly known as: IMODIUM Take 1 capsule (2 mg total) by mouth 4 (four) times daily as needed for diarrhea or loose stools.   meclizine 25 MG tablet Commonly known as: ANTIVERT Take 25 mg by mouth 3 (three) times daily as needed for dizziness.   metFORMIN 500 MG tablet Commonly known as: GLUCOPHAGE Take 1 tablet (500 mg total) by mouth 2 (two) times daily with a meal.   montelukast 10 MG tablet Commonly known as: SINGULAIR Take 10 mg by mouth daily.   ondansetron 4 MG tablet Commonly known as: ZOFRAN Take 1 tablet (4 mg total) by mouth   every 6 (six) hours as needed.   Oxcarbazepine 300 MG tablet Commonly known as: TRILEPTAL 1 po q am, 2 po qhs    oxyCODONE-acetaminophen 5-325 MG tablet Commonly known as: PERCOCET/ROXICET Take 1 tablet by mouth every 6 (six) hours as needed for severe pain.   pantoprazole 40 MG tablet Commonly known as: PROTONIX Take 1 tablet (40 mg total) by mouth 2 (two) times daily with a meal.   polyethylene glycol-electrolytes 420 g solution Commonly known as: TriLyte Take 4,000 mLs by mouth as directed.   progesterone 100 MG capsule Commonly known as: PROMETRIUM Take 100 mg by mouth at bedtime.   rosuvastatin 5 MG tablet Commonly known as: CRESTOR Take 1 tablet (5 mg total) by mouth daily. What changed: when to take this   sertraline 100 MG tablet Commonly known as: Zoloft Take 3 tablets (300 mg total) by mouth at bedtime.   sulfamethoxazole-trimethoprim 800-160 MG tablet Commonly known as: BACTRIM DS Take 1 tablet by mouth 2 (two) times daily.   zaleplon 10 MG capsule Commonly known as: SONATA TAKE 1 CAPSULE BY MOUTH AT BEDTIME AS NEEDED, MAY REPEAT 1 FOR MIDNOCTURNAL AWAKENING AS NEEDED IF THREE HOURS LEFT TO SLEEP         ROS:  A comprehensive review of systems was negative except for: Gastrointestinal: positive for abdominal pain and perianal pain Musculoskeletal: positive for neck pain Endocrine: positive for diabetes, tired sluggish  Blood pressure 119/78, pulse 79, temperature 98.3 F (36.8 C), temperature source Other (Comment), resp. rate 18, height 5' 6" (1.676 m), weight 240 lb (108.9 kg), SpO2 97 %. Physical Exam Vitals reviewed.  HENT:     Head: Atraumatic.     Nose: Nose normal.  Eyes:     Extraocular Movements: Extraocular movements intact.  Cardiovascular:     Rate and Rhythm: Normal rate.  Pulmonary:     Effort: Pulmonary effort is normal.  Abdominal:     General: There is no distension.     Palpations: Abdomen is soft.     Tenderness: There is no abdominal tenderness.  Genitourinary:    Comments: Perianal region about 4cm from verge on the left glute at the  crease area, small < 1cm incision with induration surrounding it tracking toward the anus, possible fluctuance toward anus Musculoskeletal:        General: No swelling.  Skin:    General: Skin is warm.  Neurological:     General: No focal deficit present.     Mental Status: She is alert and oriented to person, place, and time.  Psychiatric:        Mood and Affect: Mood normal.        Behavior: Behavior normal.    Procedure:  Incision and drainage of abscess Description: the area was cleaned with betadine. Lidocaine 1% was injected. Using an 11 blade an incision was made in the fluctuant area and minor bloody drainage was expressed. This was a distinct incision from the prior I&D site which was almost healed up.  No overt purulence.   Assessment & Plan:  Kristen [name] is a 50 y.o. female with a perianal abscess, doing better. I&D done to ensure not pocket trapped toward anus.  Sitz baths twice daily and as needed for comfort.  Keep area dry otherwise. Keep pad to help with drainage from the wounds. See if you have antibiotics through to Monday, if not call and we will extend them.   All questions were answered to the satisfaction  of the patient.   Future Appointments  Date Time Provider Department Center  01/02/2021 10:00 AM ,  C, MD [facility] None  01/08/2021  2:20 PM Turner, Traci R, MD [facility]  01/09/2021  2:30 PM Hurst, Teresa T, PA-C [facility] None  01/16/2021  9:30 AM [facility] None  01/23/2021  9:00 AM Pennington, Rebekah M, PA-C [facility] None  02/22/2021  8:00 AM Harper, Kristen S, PA-C [facility]      C  01/01/2021, 10:50 AM       

## 2020-12-28 NOTE — Patient Instructions (Signed)
Sitz baths twice daily and as needed for comfort.  Keep area dry otherwise. Keep pad to help with drainage from the wounds. See if you have antibiotics through to Monday, if not call and we will extend them.

## 2021-01-02 ENCOUNTER — Encounter: Payer: Self-pay | Admitting: General Surgery

## 2021-01-02 ENCOUNTER — Other Ambulatory Visit: Payer: Self-pay

## 2021-01-02 ENCOUNTER — Ambulatory Visit (INDEPENDENT_AMBULATORY_CARE_PROVIDER_SITE_OTHER): Payer: 59 | Admitting: General Surgery

## 2021-01-02 VITALS — BP 131/84 | HR 84 | Temp 98.6°F | Resp 16 | Ht 66.0 in | Wt 237.0 lb

## 2021-01-02 DIAGNOSIS — L0231 Cutaneous abscess of buttock: Secondary | ICD-10-CM

## 2021-01-02 NOTE — Patient Instructions (Signed)
Take the doxycycline for at least 5 days, if completely resolved then stop.  If not then take for 7-10 more days. Call on Monday and lets use know how you are doing. If something changes we can see you on Tuesday.

## 2021-01-02 NOTE — Progress Notes (Signed)
[facility]  Doing better. Still tender. No drainage. MRSA on her cultures from urgent care and they prescribed doxycycline.  BP 131/84   Pulse 84   Temp 98.6 F (37 C) (Other (Comment))   Resp 16   Ht 5\' 6"  (1.676 m)   Wt 237 lb (107.5 kg)   SpO2 96%   BMI 38.25 kg/m  Less induration, incisions healed on the left glute, minimal erythema   Patient with left perianal/ gluteal abscess.  Take the doxycycline for at least 5 days, if completely resolved then stop.  If not then take for 7-10 more days. Call on Monday and lets use know how you are doing. If something changes we can see you on Tuesday.  Continue your probiotics given risk of C dif.   Curlene Labrum, MD [facility] [address] [place], [place] [phone] (office)

## 2021-01-08 ENCOUNTER — Other Ambulatory Visit: Payer: Self-pay

## 2021-01-08 ENCOUNTER — Telehealth (INDEPENDENT_AMBULATORY_CARE_PROVIDER_SITE_OTHER): Payer: 59 | Admitting: Cardiology

## 2021-01-08 ENCOUNTER — Other Ambulatory Visit: Payer: Self-pay | Admitting: *Deleted

## 2021-01-08 ENCOUNTER — Encounter: Payer: Self-pay | Admitting: Cardiology

## 2021-01-08 VITALS — Ht 66.0 in | Wt 234.0 lb

## 2021-01-08 DIAGNOSIS — E781 Pure hyperglyceridemia: Secondary | ICD-10-CM

## 2021-01-08 DIAGNOSIS — G4733 Obstructive sleep apnea (adult) (pediatric): Secondary | ICD-10-CM

## 2021-01-08 DIAGNOSIS — E782 Mixed hyperlipidemia: Secondary | ICD-10-CM

## 2021-01-08 DIAGNOSIS — R0683 Snoring: Secondary | ICD-10-CM

## 2021-01-08 DIAGNOSIS — R000 Tachycardia, unspecified: Secondary | ICD-10-CM

## 2021-01-08 DIAGNOSIS — R0609 Other forms of dyspnea: Secondary | ICD-10-CM

## 2021-01-08 MED ORDER — EZETIMIBE 10 MG PO TABS: 10.0000 mg | ORAL_TABLET | Freq: Every day | ORAL | 0 refills | Status: DC

## 2021-01-08 NOTE — Patient Instructions (Signed)

## 2021-01-08 NOTE — Progress Notes (Signed)
Virtual Visit via Video Note   This visit type was conducted due to national recommendations for restrictions regarding the COVID-19 Pandemic (e.g. social distancing) in an effort to limit this patient's exposure and mitigate transmission in our community.  Due to her co-morbid illnesses, this patient is at least at moderate risk for complications without adequate follow up.  This format is felt to be most appropriate for this patient at this time.  All issues noted in this document were discussed and addressed.  A limited physical exam was performed with this format.  Please refer to the patient's chart for her consent to telehealth for [facility].  Date:  01/08/2021   ID:  Kristen [name], DOB 1970-05-07, MRN [id number] The patient was identified using 2 identifiers.  Patient Location: Home Provider Location: Office/Clinic   PCP:  Barrie Lyme   [place] Providers Cardiologist:  None     Evaluation Performed:  Follow-Up Visit  Chief Complaint:  OSA  History of Present Illness:    Kristen [name] is a 50 y.o. female with a hx of bipolar disorder, depression, anxiety, GERD, fatty liver, snoring who was referred for sleep study for excessive daytime sleepiness with an Epworth sleepiness score 13.  She also was having problems waking up from snoring.  She was found to have mild obstructive sleep apnea with an AHI of 8.5/h and mild oxygen desaturations as low as 84%.  There was loud snoring noted.  She underwent CPAP titration to 11 cm H2O and is now here for follow-up.  She is doing well with her CPAP device and thinks that she has gotten used to it.  She says that when she first got it she got COVID and could not use her device.  She still has a chronic cough and says that she did not wear the device for 3 weeks.    Since she started on the CPAP she feels great.  She tolerates the FF mask and feels the pressure is adequate.  Since going on CPAP she feels rested  in the am and has no significant daytime sleepiness.  She denies any significant mouth or nasal dryness.  She has chronic nasal congestion due to allergies.  She does not think that he snores.     The patient does not have symptoms concerning for COVID-19 infection (fever, chills, cough, or new shortness of breath).   Past Medical History:  Diagnosis Date   Allergies    Anemia    Anxiety    Arthritis    Atypical mole 11/03/2006   mid upper back (slight to moderate)   Atypical mole 11/03/2006   lower right back (slight to moderate)   Back pain    Bipolar 1 disorder (HCC)    Borderline diabetic    Chronic constipation    Constipation    Depression    Family history of adverse reaction to anesthesia    mother-- ponv   Fatty liver    GAD (generalized anxiety disorder)    GERD (gastroesophageal reflux disease)    Hiatal hernia    High triglycerides    History of kidney stones    History of recurrent UTIs    History of sepsis 06/2018   History of stomach ulcers    History of suicidal ideation    Hyperlipidemia    Hypothyroidism    IDA (iron deficiency anemia)    Joint pain    Melanoma ([place]) 11/03/2006   left chest in  situ (excision)   Orthostasis    Palpitations    PONV (postoperative nausea and vomiting)    Prediabetes    Rapid heart rate    Renal calculus, left    Seasonal allergies    Shortness of breath    Sleep apnea    Sleep disorder, unspecified    per excessive sleeping during the day   Snoring    Swallowing difficulty    Tachycardia    Vertigo    Vitamin D deficiency    Weakness    Wears contact lenses    Past Surgical History:  Procedure Laterality Date   ANTERIOR CERVICAL DECOMP/DISCECTOMY FUSION  02/17/2012   Procedure: ANTERIOR CERVICAL DECOMPRESSION/DISCECTOMY FUSION 2 LEVELS;  Surgeon: Hosie Spangle, MD;  Location: [place] [facility];  Service: Neurosurgery;  Laterality: N/A;  Cervical four-five and Cervical six-seven anterior cervial decompression  with fusion plating and bonegraft   ANTERIOR CERVICAL DECOMP/DISCECTOMY FUSION  04-01-2001   _0    C5---6   BIOPSY  07/20/2018   Procedure: BIOPSY;  Surgeon: Daneil Dolin, MD;  [facility];  Service: Endoscopy;;  gastric    BREAST REDUCTION SURGERY Bilateral 1995   [place]  x2   last one 1998   with BILATERAL TUBAL LIGATION   CESAREAN SECTION WITH BILATERAL TUBAL LIGATION     COLONOSCOPY WITH PROPOFOL N/A 07/20/2018   Dr. Gala Romney: Diverticulosis, internal hemorrhoids, 5 mm splenic flexure tubular adenoma removed. Repeat in 5 years due to family history.   COLONOSCOPY WITH PROPOFOL N/A 12/07/2020   Procedure: COLONOSCOPY WITH PROPOFOL;  Surgeon: Daneil Dolin, MD;  [facility];  Service: Endoscopy;  Laterality: N/A;  2:30pm   CYSTOSCOPY/URETEROSCOPY/HOLMIUM LASER/STENT PLACEMENT Left 08/21/2018   Procedure: CYSTOSCOPY LEFT RETROGRADE PYELOGRAM /URETEROSCOPY/HOLMIUM LASER/STENT PLACEMENT;  Surgeon: Ceasar Mons, MD;  Location: [facility];  Service: Urology;  Laterality: Left;   DILATION AND CURETTAGE OF UTERUS  1992   for miscarriage   ENDOMETRIAL ABLATION  2014   ESOPHAGOGASTRODUODENOSCOPY     20 years ago.    ESOPHAGOGASTRODUODENOSCOPY (EGD) WITH PROPOFOL N/A 07/20/2018   Dr. Gala Romney: Small hiatal hernia, erythematous mucosa in the stomach with a healing gastric ulcer measuring 1 cm, biopsies negative.   ESOPHAGOGASTRODUODENOSCOPY (EGD) WITH PROPOFOL N/A 10/05/2020   Procedure: ESOPHAGOGASTRODUODENOSCOPY (EGD) WITH PROPOFOL;  Surgeon: Daneil Dolin, MD;  [facility];  Service: Endoscopy;  Laterality: N/A;   MALONEY DILATION N/A 10/05/2020   Procedure: Venia Minks DILATION;  Surgeon: Daneil Dolin, MD;  [facility];  Service: Endoscopy;  Laterality: N/A;   [place]   unilateral for infertility   NASAL SINUS SURGERY  2010  approx.   POLYPECTOMY  07/20/2018   Procedure: POLYPECTOMY;  Surgeon:  Daneil Dolin, MD;  [facility];  Service: Endoscopy;;   POSTERIOR CERVICAL FUSION/FORAMINOTOMY N/A 08/18/2013   Procedure: CERVICAL FOUR TO CERVICAL SEVEN POSTERIOR CERVICAL FUSION/FORAMINOTOMY LEVEL 3;  Surgeon: Hosie Spangle, MD;  Location: [place] [facility];  Service: Neurosurgery;  Laterality: N/A;  C4-7 posterior cervical fusion with lateral mass fixation   RIGHT/LEFT HEART CATH AND CORONARY ANGIOGRAPHY N/A 12/23/2019   Procedure: RIGHT/LEFT HEART CATH AND CORONARY ANGIOGRAPHY;  Surgeon: Burnell Blanks, MD;  Location: [place] [facility];  Service: Cardiovascular;  Laterality: N/A;     Current Meds  Medication Sig   acetaminophen (TYLENOL) 500 MG tablet Take 1,000 mg by mouth every 6 (six) hours as needed for moderate  pain.   albuterol (VENTOLIN HFA) 108 (90 Base) MCG/ACT inhaler Inhale 2 puffs into the lungs every 6 (six) hours as needed for wheezing or shortness of breath.   aspirin EC 81 MG tablet Take 1 tablet (81 mg total) by mouth daily. Swallow whole.   Azelastine-Fluticasone (DYMISTA) 137-50 MCG/ACT SUSP Place 1 spray into both nostrils 2 (two) times daily.   buPROPion (WELLBUTRIN XL) 150 MG 24 hr tablet Take 3 tablets (450 mg total) by mouth daily.   clonazePAM (KLONOPIN) 1 MG tablet 1 p.o. 3 times daily as needed anxiety and may repeat 1/2 pill during the middle of the day as needed anxiety.  No more than 3.5 mg/day.   estradiol (ESTRACE) 1 MG tablet Take 1 mg by mouth daily.   ezetimibe (ZETIA) 10 MG tablet Take 1 tablet (10 mg total) by mouth daily.   fenofibrate (TRICOR) 145 MG tablet Take 1 tablet (145 mg total) by mouth daily.   Fluticasone-Salmeterol,sensor, (AIRDUO DIGIHALER) 232-14 MCG/ACT AEPB Inhale 1 puff into the lungs 2 (two) times daily.   hydrocortisone (ANUSOL-HC) 2.5 % rectal cream Place 1 application rectally 2-4 times daily for rectal bleeding/burning related to hemorrhoids. (Patient taking differently: Place 1 application rectally 4 (four)  times daily as needed for hemorrhoids. P\)   hydrOXYzine (ATARAX/VISTARIL) 25 MG tablet Take 1 tablet by mouth three times daily as needed (Patient taking differently: Take 25 mg by mouth 3 (three) times daily as needed for anxiety.)   icosapent Ethyl (VASCEPA) 1 g capsule Take 2 capsules (2 g total) by mouth 2 (two) times daily.   ipratropium (ATROVENT) 0.06 % nasal spray Place 2 sprays into both nostrils 3 (three) times daily.   lamoTRIgine (LAMICTAL) 200 MG tablet Take 2.5 tablets (500 mg total) by mouth daily.   levocetirizine (XYZAL) 5 MG tablet Take 5 mg by mouth every evening.   levothyroxine (SYNTHROID) 75 MCG tablet Take 75 mcg by mouth daily before breakfast.    linaclotide (LINZESS) 290 MCG CAPS capsule Take 290 mcg by mouth daily before breakfast.   loperamide (IMODIUM) 2 MG capsule Take 1 capsule (2 mg total) by mouth 4 (four) times daily as needed for diarrhea or loose stools.   meclizine (ANTIVERT) 25 MG tablet Take 25 mg by mouth 3 (three) times daily as needed for dizziness.   metFORMIN (GLUCOPHAGE) 500 MG tablet Take 1 tablet (500 mg total) by mouth 2 (two) times daily with a meal.   montelukast (SINGULAIR) 10 MG tablet Take 10 mg by mouth daily.    ondansetron (ZOFRAN) 4 MG tablet Take 1 tablet (4 mg total) by mouth every 6 (six) hours as needed. (Patient taking differently: Take 4 mg by mouth every 6 (six) hours as needed for nausea or vomiting.)   Oxcarbazepine (TRILEPTAL) 300 MG tablet 1 po q am, 2 po qhs (Patient taking differently: Take 900 mg by mouth at bedtime.)   pantoprazole (PROTONIX) 40 MG tablet Take 1 tablet (40 mg total) by mouth 2 (two) times daily with a meal.   progesterone (PROMETRIUM) 100 MG capsule Take 100 mg by mouth at bedtime.   rosuvastatin (CRESTOR) 5 MG tablet Take 1 tablet (5 mg total) by mouth daily. (Patient taking differently: Take 5 mg by mouth at bedtime.)   sertraline (ZOLOFT) 100 MG tablet Take 3 tablets (300 mg total) by mouth at bedtime.    zaleplon (SONATA) 10 MG capsule TAKE 1 CAPSULE BY MOUTH AT BEDTIME AS NEEDED, MAY REPEAT 1 FOR MIDNOCTURNAL AWAKENING AS  NEEDED IF THREE HOURS LEFT TO SLEEP     Allergies:   Iohexol and Rosuvastatin   Social History   Tobacco Use   Smoking status: Former    Packs/day: 1.00    Years: 5.00    Pack years: 5.00    Types: Cigarettes    Quit date: 02/09/2002    Years since quitting: 18.9   Smokeless tobacco: Never  Vaping Use   Vaping Use: Never used  Substance Use Topics   Alcohol use: No   Drug use: No     Family Hx: The patient's family history includes Alcoholism in her father; Allergies in an other family member; Anxiety disorder in her father; Asthma in her mother; Atrial fibrillation in her mother; Bone cancer in her paternal grandfather; Cancer in her father and mother; Colon cancer in her father and paternal grandfather; Congestive Heart Failure in her maternal grandmother and mother; Depression in her father; Diabetes in her maternal grandmother and mother; Hyperlipidemia in her mother; Hypertension in her father; Mental illness in her paternal grandmother; Multiple myeloma in her father; Non-Hodgkin's lymphoma in her mother; Obesity in her father; Rheum arthritis in her mother.  ROS:   Please see the history of present illness.     All other systems reviewed and are negative.   Prior CV studies:   The following studies were reviewed today:  Sleep study  and CPAP titration, PAP compliance download  Labs/Other Tests and Data Reviewed:    EKG:  No ECG reviewed.  Recent Labs: 08/02/2020: ALT 33 10/12/2020: BUN 17; Creatinine, Ser 0.88; Hemoglobin 12.8; Platelets 452; Potassium 3.8; Sodium 138   Recent Lipid Panel Lab Results  Component Value Date/Time   CHOL 181 05/12/2020 11:17 AM   TRIG 289 (H) 05/12/2020 11:17 AM   HDL 41 05/12/2020 11:17 AM   CHOLHDL 4.4 05/12/2020 11:17 AM   CHOLHDL 4.9 05/04/2008 06:06 AM   LDLCALC 92 05/12/2020 11:17 AM    Wt Readings from  Last 3 Encounters:  01/08/21 234 lb (106.1 kg)  01/02/21 237 lb (107.5 kg)  12/28/20 240 lb (108.9 kg)     Risk Assessment/Calculations:          Objective:    Vital Signs:  Ht _0  (1.676 m)   Wt 234 lb (106.1 kg)   BMI 37.77 kg/m    VITAL SIGNS:  reviewed GEN:  no acute distress EYES:  sclerae anicteric, EOMI - Extraocular Movements Intact RESPIRATORY:  normal respiratory effort, symmetric expansion CARDIOVASCULAR:  no peripheral edema SKIN:  no rash, lesions or ulcers. MUSCULOSKELETAL:  no obvious deformities. NEURO:  alert and oriented x 3, no obvious focal deficit PSYCH:  normal affect  ASSESSMENT & PLAN:    OSA - The patient is tolerating PAP therapy well without any problems. The PAP download performed by his DME was personally reviewed and interpreted by me today and showed an AHI of 1.2/hr on 11 cm H2O with 43% compliance in using more than 4 hours nightly.  The patient has been using and benefiting from PAP use and will continue to benefit from therapy.  -her compliance was down because she have COVID 19 and did not use her device for 3 weeks because was so sick but now is on her device and is tolerating it well.     COVID-19 Education: The signs and symptoms of COVID-19 were discussed with the patient and how to seek care for testing (follow up with PCP or arrange E-visit).  The importance  of social distancing was discussed today.  Time:   Today, I have spent 20 minutes with the patient with telehealth technology discussing the above problems.     Medication Adjustments/Labs and Tests Ordered: Current medicines are reviewed at length with the patient today.  Concerns regarding medicines are outlined above.   Tests Ordered: No orders of the defined types were placed in this encounter.   Medication Changes: No orders of the defined types were placed in this encounter.   Follow Up:  In Person in 3 month(s)  Signed, Fransico Him, MD  01/08/2021 2:42 PM     [facility]

## 2021-01-09 ENCOUNTER — Ambulatory Visit: Payer: 59 | Admitting: Physician Assistant

## 2021-01-09 DIAGNOSIS — R053 Chronic cough: Secondary | ICD-10-CM | POA: Insufficient documentation

## 2021-01-09 DIAGNOSIS — K219 Gastro-esophageal reflux disease without esophagitis: Secondary | ICD-10-CM | POA: Insufficient documentation

## 2021-01-09 DIAGNOSIS — K5909 Other constipation: Secondary | ICD-10-CM | POA: Insufficient documentation

## 2021-01-09 DIAGNOSIS — D649 Anemia, unspecified: Secondary | ICD-10-CM

## 2021-01-15 ENCOUNTER — Other Ambulatory Visit (HOSPITAL_COMMUNITY): Payer: 59

## 2021-01-16 ENCOUNTER — Inpatient Hospital Stay (HOSPITAL_COMMUNITY): Payer: 59 | Attending: Hematology

## 2021-01-16 ENCOUNTER — Other Ambulatory Visit: Payer: Self-pay

## 2021-01-16 DIAGNOSIS — Z8349 Family history of other endocrine, nutritional and metabolic diseases: Secondary | ICD-10-CM | POA: Insufficient documentation

## 2021-01-16 DIAGNOSIS — Z79899 Other long term (current) drug therapy: Secondary | ICD-10-CM | POA: Diagnosis not present

## 2021-01-16 DIAGNOSIS — R059 Cough, unspecified: Secondary | ICD-10-CM | POA: Diagnosis not present

## 2021-01-16 DIAGNOSIS — D75839 Thrombocytosis, unspecified: Secondary | ICD-10-CM | POA: Diagnosis not present

## 2021-01-16 DIAGNOSIS — Z8249 Family history of ischemic heart disease and other diseases of the circulatory system: Secondary | ICD-10-CM | POA: Diagnosis not present

## 2021-01-16 DIAGNOSIS — G479 Sleep disorder, unspecified: Secondary | ICD-10-CM | POA: Diagnosis not present

## 2021-01-16 DIAGNOSIS — R519 Headache, unspecified: Secondary | ICD-10-CM | POA: Diagnosis not present

## 2021-01-16 DIAGNOSIS — Z800 Family history of malignant neoplasm of digestive organs: Secondary | ICD-10-CM | POA: Insufficient documentation

## 2021-01-16 DIAGNOSIS — R42 Dizziness and giddiness: Secondary | ICD-10-CM | POA: Diagnosis not present

## 2021-01-16 DIAGNOSIS — Z86010 Personal history of colonic polyps: Secondary | ICD-10-CM | POA: Insufficient documentation

## 2021-01-16 DIAGNOSIS — Z8261 Family history of arthritis: Secondary | ICD-10-CM | POA: Insufficient documentation

## 2021-01-16 DIAGNOSIS — Z87440 Personal history of urinary (tract) infections: Secondary | ICD-10-CM | POA: Insufficient documentation

## 2021-01-16 DIAGNOSIS — Z87891 Personal history of nicotine dependence: Secondary | ICD-10-CM | POA: Diagnosis not present

## 2021-01-16 DIAGNOSIS — Z809 Family history of malignant neoplasm, unspecified: Secondary | ICD-10-CM | POA: Insufficient documentation

## 2021-01-16 DIAGNOSIS — Z808 Family history of malignant neoplasm of other organs or systems: Secondary | ICD-10-CM | POA: Insufficient documentation

## 2021-01-16 DIAGNOSIS — Z8719 Personal history of other diseases of the digestive system: Secondary | ICD-10-CM | POA: Diagnosis not present

## 2021-01-16 DIAGNOSIS — Z818 Family history of other mental and behavioral disorders: Secondary | ICD-10-CM | POA: Insufficient documentation

## 2021-01-16 DIAGNOSIS — Z6372 Alcoholism and drug addiction in family: Secondary | ICD-10-CM | POA: Diagnosis not present

## 2021-01-16 DIAGNOSIS — F32A Depression, unspecified: Secondary | ICD-10-CM | POA: Insufficient documentation

## 2021-01-16 DIAGNOSIS — D72829 Elevated white blood cell count, unspecified: Secondary | ICD-10-CM | POA: Insufficient documentation

## 2021-01-16 DIAGNOSIS — R5383 Other fatigue: Secondary | ICD-10-CM | POA: Diagnosis not present

## 2021-01-16 DIAGNOSIS — Z836 Family history of other diseases of the respiratory system: Secondary | ICD-10-CM | POA: Insufficient documentation

## 2021-01-16 DIAGNOSIS — Z833 Family history of diabetes mellitus: Secondary | ICD-10-CM | POA: Insufficient documentation

## 2021-01-16 DIAGNOSIS — K219 Gastro-esophageal reflux disease without esophagitis: Secondary | ICD-10-CM | POA: Insufficient documentation

## 2021-01-16 LAB — COMPREHENSIVE METABOLIC PANEL
ALT: 34 U/L (ref 0–44)
AST: 30 U/L (ref 15–41)
Albumin: 4.5 g/dL (ref 3.5–5.0)
Alkaline Phosphatase: 82 U/L (ref 38–126)
Anion gap: 9 (ref 5–15)
BUN: 19 mg/dL (ref 6–20)
CO2: 25 mmol/L (ref 22–32)
Calcium: 9.6 mg/dL (ref 8.9–10.3)
Chloride: 106 mmol/L (ref 98–111)
Creatinine, Ser: 0.98 mg/dL (ref 0.44–1.00)
GFR, Estimated: >60 mL/min (ref >60)
Glucose, Bld: 114 mg/dL — ABNORMAL HIGH (ref 70–99)
Potassium: 3.9 mmol/L (ref 3.5–5.1)
Sodium: 140 mmol/L (ref 135–145)
Total Bilirubin: 0.6 mg/dL (ref 0.3–1.2)
Total Protein: 7.3 g/dL (ref 6.5–8.1)

## 2021-01-16 LAB — LACTATE DEHYDROGENASE: LDH: 153 U/L (ref 98–192)

## 2021-01-16 LAB — CBC WITH DIFFERENTIAL/PLATELET
Abs Immature Granulocytes: 0.05 10*3/uL (ref 0.00–0.07)
Basophils Absolute: 0.1 10*3/uL (ref 0.0–0.1)
Basophils Relative: 1 %
Eosinophils Absolute: 0.3 10*3/uL (ref 0.0–0.5)
Eosinophils Relative: 4 %
HCT: 40.7 % (ref 36.0–46.0)
Hemoglobin: 12.9 g/dL (ref 12.0–15.0)
Immature Granulocytes: 1 %
Lymphocytes Relative: 30 %
Lymphs Abs: 2.4 10*3/uL (ref 0.7–4.0)
MCH: 28.2 pg (ref 26.0–34.0)
MCHC: 31.7 g/dL (ref 30.0–36.0)
MCV: 88.9 fL (ref 80.0–100.0)
Monocytes Absolute: 0.8 10*3/uL (ref 0.1–1.0)
Monocytes Relative: 10 %
Neutro Abs: 4.5 10*3/uL (ref 1.7–7.7)
Neutrophils Relative %: 54 %
Platelets: 375 10*3/uL (ref 150–400)
RBC: 4.58 MIL/uL (ref 3.87–5.11)
RDW: 14.1 % (ref 11.5–15.5)
WBC: 8.1 10*3/uL (ref 4.0–10.5)
nRBC: 0 % (ref 0.0–0.2)

## 2021-01-21 ENCOUNTER — Encounter: Payer: Self-pay | Admitting: Cardiology

## 2021-01-22 ENCOUNTER — Ambulatory Visit (HOSPITAL_COMMUNITY): Payer: 59 | Admitting: Physician Assistant

## 2021-01-22 ENCOUNTER — Encounter: Payer: Self-pay | Admitting: Cardiology

## 2021-01-22 NOTE — Progress Notes (Signed)
Kristen [name], [place] [address]   CLINIC:  Medical Oncology/Hematology  PCP:  Barrie Lyme [address] [place] [address] [phone]   REASON FOR VISIT:  Follow-up for leukocytosis  PRIOR THERAPY: None  CURRENT THERAPY: Observation  INTERVAL HISTORY:  Kristen [name] 50 y.o. female returns for routine follow-up of leukocytosis.  She was last seen by Tarri Abernethy PA-C on 07/06/2020.  At today's visit, she reports feeling fair.  No recent hospitalizations, surgeries, or changes in baseline health status.  She has been much more stressed lately due to some health issues that her husband is having, with recurrent seizures of unknown origin causing him to be out of work for the past several months.  She reports that she has had several infections over the past few months - diverticulitis (August/September 2022), C. Diff (September 2022), COVID-19 infection (October 2022), and MRSA cellulitis of her leg (November 2022).  She did experience B symptoms during these infections, but she has not had any other fever, chills, night sweats, or unintentional weight loss.  She has not noted any new lumps or bumps.  She did require steroids during her treatment of COVID in October 2022.  She has very little energy and 100% appetite. She endorses that she is maintaining a stable weight.   REVIEW OF SYSTEMS:  Review of Systems  Constitutional:  Positive for fatigue. Negative for appetite change, chills, diaphoresis, fever and unexpected weight change.  HENT:   Negative for lump/mass and nosebleeds.   Eyes:  Negative for eye problems.  Respiratory:  Positive for cough (Chronic dry cough). Negative for hemoptysis and shortness of breath.   Cardiovascular:  Negative for chest pain, leg swelling and palpitations.  Gastrointestinal:  Negative for abdominal pain, blood in stool, constipation, diarrhea, nausea and vomiting.  Genitourinary:  Negative  for hematuria.   Skin: Negative.   Neurological:  Positive for dizziness and headaches. Negative for light-headedness.  Hematological:  Does not bruise/bleed easily.  Psychiatric/Behavioral:  Positive for depression and sleep disturbance. The patient is nervous/anxious.      PAST MEDICAL/SURGICAL HISTORY:  Past Medical History:  Diagnosis Date   Allergies    Anemia    Anxiety    Arthritis    Atypical mole 11/03/2006   mid upper back (slight to moderate)   Atypical mole 11/03/2006   lower right back (slight to moderate)   Back pain    Bipolar 1 disorder (HCC)    Borderline diabetic    Chronic constipation    Constipation    Depression    Family history of adverse reaction to anesthesia    mother-- ponv   Fatty liver    GAD (generalized anxiety disorder)    GERD (gastroesophageal reflux disease)    Hiatal hernia    High triglycerides    History of kidney stones    History of recurrent UTIs    History of sepsis 06/2018   History of stomach ulcers    History of suicidal ideation    Hyperlipidemia    Hypothyroidism    IDA (iron deficiency anemia)    Joint pain    Melanoma ([place]) 11/03/2006   left chest in situ (excision)   Orthostasis    Palpitations    PONV (postoperative nausea and vomiting)    Prediabetes    Rapid heart rate    Renal calculus, left    Seasonal allergies    Shortness of breath  Sleep apnea    Sleep disorder, unspecified    per excessive sleeping during the day   Snoring    Swallowing difficulty    Tachycardia    Vertigo    Vitamin D deficiency    Weakness    Wears contact lenses    Past Surgical History:  Procedure Laterality Date   ANTERIOR CERVICAL DECOMP/DISCECTOMY FUSION  02/17/2012   Procedure: ANTERIOR CERVICAL DECOMPRESSION/DISCECTOMY FUSION 2 LEVELS;  Surgeon: Hosie Spangle, MD;  Location: [facility];  Service: Neurosurgery;  Laterality: N/A;  Cervical four-five and Cervical six-seven anterior cervial decompression with  fusion plating and bonegraft   ANTERIOR CERVICAL DECOMP/DISCECTOMY FUSION  04-01-2001   '@[facility]'    C5---6   BIOPSY  07/20/2018   Procedure: BIOPSY;  Surgeon: Daneil Dolin, MD;  [facility];  Service: Endoscopy;;  gastric    BREAST REDUCTION SURGERY Bilateral 1995   [place]  x2   last one 1998   with BILATERAL TUBAL LIGATION   CESAREAN SECTION WITH BILATERAL TUBAL LIGATION     COLONOSCOPY WITH PROPOFOL N/A 07/20/2018   Dr. Gala Romney: Diverticulosis, internal hemorrhoids, 5 mm splenic flexure tubular adenoma removed. Repeat in 5 years due to family history.   COLONOSCOPY WITH PROPOFOL N/A 12/07/2020   Procedure: COLONOSCOPY WITH PROPOFOL;  Surgeon: Daneil Dolin, MD;  [facility];  Service: Endoscopy;  Laterality: N/A;  2:30pm   CYSTOSCOPY/URETEROSCOPY/HOLMIUM LASER/STENT PLACEMENT Left 08/21/2018   Procedure: CYSTOSCOPY LEFT RETROGRADE PYELOGRAM /URETEROSCOPY/HOLMIUM LASER/STENT PLACEMENT;  Surgeon: Ceasar Mons, MD;  Location: [facility];  Service: Urology;  Laterality: Left;   DILATION AND CURETTAGE OF UTERUS  1992   for miscarriage   ENDOMETRIAL ABLATION  2014   ESOPHAGOGASTRODUODENOSCOPY     20 years ago.    ESOPHAGOGASTRODUODENOSCOPY (EGD) WITH PROPOFOL N/A 07/20/2018   Dr. Gala Romney: Small hiatal hernia, erythematous mucosa in the stomach with a healing gastric ulcer measuring 1 cm, biopsies negative.   ESOPHAGOGASTRODUODENOSCOPY (EGD) WITH PROPOFOL N/A 10/05/2020   Procedure: ESOPHAGOGASTRODUODENOSCOPY (EGD) WITH PROPOFOL;  Surgeon: Daneil Dolin, MD;  [facility];  Service: Endoscopy;  Laterality: N/A;   MALONEY DILATION N/A 10/05/2020   Procedure: Venia Minks DILATION;  Surgeon: Daneil Dolin, MD;  [facility];  Service: Endoscopy;  Laterality: N/A;   [place]   unilateral for infertility   NASAL SINUS SURGERY  2010  approx.   POLYPECTOMY  07/20/2018   Procedure: POLYPECTOMY;  Surgeon:  Daneil Dolin, MD;  [facility];  Service: Endoscopy;;   POSTERIOR CERVICAL FUSION/FORAMINOTOMY N/A 08/18/2013   Procedure: CERVICAL FOUR TO CERVICAL SEVEN POSTERIOR CERVICAL FUSION/FORAMINOTOMY LEVEL 3;  Surgeon: Hosie Spangle, MD;  Location: [facility];  Service: Neurosurgery;  Laterality: N/A;  C4-7 posterior cervical fusion with lateral mass fixation   RIGHT/LEFT HEART CATH AND CORONARY ANGIOGRAPHY N/A 12/23/2019   Procedure: RIGHT/LEFT HEART CATH AND CORONARY ANGIOGRAPHY;  Surgeon: Burnell Blanks, MD;  Location: [facility];  Service: Cardiovascular;  Laterality: N/A;     SOCIAL HISTORY:  Social History   Socioeconomic History   Marital status: Married    Spouse name: Not on file   Number of children: 2   Years of education: Not on file   Highest education level: Not on file  Occupational History   Occupation: disability  Tobacco Use   Smoking status: Former    Packs/day: 1.00    Years: 5.00  Pack years: 5.00    Types: Cigarettes    Quit date: 02/09/2002    Years since quitting: 18.9   Smokeless tobacco: Never  Vaping Use   Vaping Use: Never used  Substance and Sexual Activity   Alcohol use: No   Drug use: No   Sexual activity: Yes    Birth control/protection: Surgical  Other Topics Concern   Not on file  Social History Narrative   Step brother-2   Step sister-2   Half sister -1 healthy   Social Determinants of Health   Financial Resource Strain: Not on file  Food Insecurity: Not on file  Transportation Needs: Not on file  Physical Activity: Not on file  Stress: Not on file  Social Connections: Not on file  Intimate Partner Violence: Not on file    FAMILY HISTORY:  Family History  Problem Relation Age of Onset   Asthma Mother    Rheum arthritis Mother    Non-Hodgkin's lymphoma Mother    Congestive Heart Failure Mother    Atrial fibrillation Mother    Diabetes Mother    Hyperlipidemia Mother    Cancer Mother     Colon cancer Father    Multiple myeloma Father    Hypertension Father    Cancer Father    Depression Father    Anxiety disorder Father    Alcoholism Father    Obesity Father    Allergies Other        entire family(mom and dad)   Diabetes Maternal Grandmother    Congestive Heart Failure Maternal Grandmother    Mental illness Paternal Grandmother    Colon cancer Paternal Grandfather    Bone cancer Paternal Grandfather     CURRENT MEDICATIONS:  Outpatient Encounter Medications as of 01/23/2021  Medication Sig   acetaminophen (TYLENOL) 500 MG tablet Take 1,000 mg by mouth every 6 (six) hours as needed for moderate pain.   albuterol (VENTOLIN HFA) 108 (90 Base) MCG/ACT inhaler Inhale 2 puffs into the lungs every 6 (six) hours as needed for wheezing or shortness of breath.   aspirin EC 81 MG tablet Take 1 tablet (81 mg total) by mouth daily. Swallow whole.   Azelastine-Fluticasone (DYMISTA) 137-50 MCG/ACT SUSP Place 1 spray into both nostrils 2 (two) times daily.   buPROPion (WELLBUTRIN XL) 150 MG 24 hr tablet Take 3 tablets (450 mg total) by mouth daily.   cetirizine (ZYRTEC) 10 MG tablet Take 10 mg by mouth daily. (Patient not taking: Reported on 01/08/2021)   clonazePAM (KLONOPIN) 1 MG tablet 1 p.o. 3 times daily as needed anxiety and may repeat 1/2 pill during the middle of the day as needed anxiety.  No more than 3.5 mg/day.   estradiol (ESTRACE) 1 MG tablet Take 1 mg by mouth daily.   ezetimibe (ZETIA) 10 MG tablet Take 1 tablet (10 mg total) by mouth daily.   fenofibrate (TRICOR) 145 MG tablet Take 1 tablet (145 mg total) by mouth daily.   Fluticasone-Salmeterol,sensor, (AIRDUO DIGIHALER) 232-14 MCG/ACT AEPB Inhale 1 puff into the lungs 2 (two) times daily.   hydrocortisone (ANUSOL-HC) 2.5 % rectal cream Place 1 application rectally 2-4 times daily for rectal bleeding/burning related to hemorrhoids. (Patient taking differently: Place 1 application rectally 4 (four) times daily as  needed for hemorrhoids. P\)   hydrOXYzine (ATARAX/VISTARIL) 25 MG tablet Take 1 tablet by mouth three times daily as needed (Patient taking differently: Take 25 mg by mouth 3 (three) times daily as needed for anxiety.)   icosapent  Ethyl (VASCEPA) 1 g capsule Take 2 capsules (2 g total) by mouth 2 (two) times daily.   ipratropium (ATROVENT) 0.06 % nasal spray Place 2 sprays into both nostrils 3 (three) times daily.   lamoTRIgine (LAMICTAL) 200 MG tablet Take 2.5 tablets (500 mg total) by mouth daily.   levocetirizine (XYZAL) 5 MG tablet Take 5 mg by mouth every evening.   levothyroxine (SYNTHROID) 75 MCG tablet Take 75 mcg by mouth daily before breakfast.    linaclotide (LINZESS) 290 MCG CAPS capsule Take 290 mcg by mouth daily before breakfast.   loperamide (IMODIUM) 2 MG capsule Take 1 capsule (2 mg total) by mouth 4 (four) times daily as needed for diarrhea or loose stools.   meclizine (ANTIVERT) 25 MG tablet Take 25 mg by mouth 3 (three) times daily as needed for dizziness.   metFORMIN (GLUCOPHAGE) 500 MG tablet Take 1 tablet (500 mg total) by mouth 2 (two) times daily with a meal.   montelukast (SINGULAIR) 10 MG tablet Take 10 mg by mouth daily.    ondansetron (ZOFRAN) 4 MG tablet Take 1 tablet (4 mg total) by mouth every 6 (six) hours as needed. (Patient taking differently: Take 4 mg by mouth every 6 (six) hours as needed for nausea or vomiting.)   Oxcarbazepine (TRILEPTAL) 300 MG tablet 1 po q am, 2 po qhs (Patient taking differently: Take 900 mg by mouth at bedtime.)   oxyCODONE-acetaminophen (PERCOCET/ROXICET) 5-325 MG tablet Take 1 tablet by mouth every 6 (six) hours as needed for severe pain. (Patient not taking: Reported on 01/08/2021)   pantoprazole (PROTONIX) 40 MG tablet Take 1 tablet (40 mg total) by mouth 2 (two) times daily with a meal.   polyethylene glycol-electrolytes (TRILYTE) 420 g solution Take 4,000 mLs by mouth as directed. (Patient not taking: Reported on 01/08/2021)    progesterone (PROMETRIUM) 100 MG capsule Take 100 mg by mouth at bedtime.   rosuvastatin (CRESTOR) 5 MG tablet Take 1 tablet (5 mg total) by mouth daily. (Patient taking differently: Take 5 mg by mouth at bedtime.)   sertraline (ZOLOFT) 100 MG tablet Take 3 tablets (300 mg total) by mouth at bedtime.   zaleplon (SONATA) 10 MG capsule TAKE 1 CAPSULE BY MOUTH AT BEDTIME AS NEEDED, MAY REPEAT 1 FOR MIDNOCTURNAL AWAKENING AS NEEDED IF THREE HOURS LEFT TO SLEEP   No facility-administered encounter medications on file as of 01/23/2021.    ALLERGIES:  Allergies  Allergen Reactions   Iohexol Hives     Desc: IVP DYE   Rosuvastatin Other (See Comments)    Muscle aches with 39m and 234m tolerates 41m46maily without issues     PHYSICAL EXAM:  ECOG PERFORMANCE STATUS: 1 - Symptomatic but completely ambulatory  There were no vitals filed for this visit. There were no vitals filed for this visit. Physical Exam Constitutional:      Appearance: Normal appearance. She is obese.  HENT:     Head: Normocephalic and atraumatic.     Mouth/Throat:     Mouth: Mucous membranes are moist.  Eyes:     Extraocular Movements: Extraocular movements intact.     Pupils: Pupils are equal, round, and reactive to light.  Cardiovascular:     Rate and Rhythm: Normal rate and regular rhythm.     Pulses: Normal pulses.     Heart sounds: Normal heart sounds.  Pulmonary:     Effort: Pulmonary effort is normal.     Breath sounds: Normal breath sounds.  Abdominal:  General: Bowel sounds are normal.     Palpations: Abdomen is soft.     Tenderness: There is no abdominal tenderness.  Musculoskeletal:        General: No swelling.     Right lower leg: No edema.     Left lower leg: No edema.  Lymphadenopathy:     Cervical: No cervical adenopathy.  Skin:    General: Skin is warm and dry.  Neurological:     General: No focal deficit present.     Mental Status: She is alert and oriented to person, place, and  time.  Psychiatric:        Mood and Affect: Mood normal.        Behavior: Behavior normal.     LABORATORY DATA:  I have reviewed the labs as listed.  CBC    Component Value Date/Time   WBC 8.1 01/16/2021 0922   RBC 4.58 01/16/2021 0922   HGB 12.9 01/16/2021 0922   HGB 12.9 12/17/2019 1052   HCT 40.7 01/16/2021 0922   HCT 39.0 12/17/2019 1052   PLT 375 01/16/2021 0922   PLT 425 12/17/2019 1052   MCV 88.9 01/16/2021 0922   MCV 86 12/17/2019 1052   MCH 28.2 01/16/2021 0922   MCHC 31.7 01/16/2021 0922   RDW 14.1 01/16/2021 0922   RDW 13.5 12/17/2019 1052   LYMPHSABS 2.4 01/16/2021 0922   LYMPHSABS 3.0 12/17/2019 1052   MONOABS 0.8 01/16/2021 0922   EOSABS 0.3 01/16/2021 0922   EOSABS 0.8 (H) 12/17/2019 1052   BASOSABS 0.1 01/16/2021 0922   BASOSABS 0.1 12/17/2019 1052   CMP Latest Ref Rng & Units 01/16/2021 10/12/2020 08/02/2020  Glucose 70 - 99 mg/dL 114(H) 123(H) 97  BUN 6 - 20 mg/dL '19 17 21  ' Creatinine 0.44 - 1.00 mg/dL 0.98 0.88 1.09(H)  Sodium 135 - 145 mmol/L 140 138 142  Potassium 3.5 - 5.1 mmol/L 3.9 3.8 5.2  Chloride 98 - 111 mmol/L 106 107 106  CO2 22 - 32 mmol/L '25 25 27  ' Calcium 8.9 - 10.3 mg/dL 9.6 9.1 10.3  Total Protein 6.5 - 8.1 g/dL 7.3 - 7.4  Total Bilirubin 0.3 - 1.2 mg/dL 0.6 - 0.4  Alkaline Phos 38 - 126 U/L 82 - -  AST 15 - 41 U/L 30 - 27  ALT 0 - 44 U/L 34 - 33(H)    DIAGNOSTIC IMAGING:  I have independently reviewed the relevant imaging and discussed with the patient.  ASSESSMENT & PLAN: 1.  Neutrophilic leukocytosis and thrombocytosis - JAK2 CALR, and MPL testing was negative; negative BCR/ABL FISH testing; normal LDH, negative SPEP testing - History of intermittent neutrophilic leukocytosis since 2009, with WBC up to to 20.5 - History of intermittent UTIs.  No prior history of autoimmune disorders.  She quit smoking in 2003. - Ultrasound of the abdomen on 05/27/2019 showed probable fatty infiltration of the liver with minimal splenic  enlargement measuring 12.1 cm in length with volume calculated to 502 mL - She had several infections in fall 2022, but did not had any infections in the past month.  No B symptoms (no fevers, night sweats, weight loss). -Most recent labs (01/16/2021): Normal CBC with white blood cells 8.1, hemoglobin 12.9, platelets 375, normal differential -Differential diagnosis favors reactive leukocytosis/thrombocytosis in the setting of obesity and frequent infections. - PLAN: Blood counts currently normal.  No concerning symptoms.  MPN work-up negative. - No further work-up or hematology follow-up needed at this time. - Patient can follow-up  with her PCP for ongoing lab work at least once per year.  If she has any significant deviations (WBC > 20,000 or platelets > 500) in the absence of inciting event (i.e. infection), she should be referred back to [place] for additional work-up. - Patient verbalized agreement and understanding of the above plan.  2.  Family history: -Father had myeloma and paternal grandfather also had myeloma.  Mother had non-Hodgkin's lymphoma.  Maternal uncle had non-Hodgkin's lymphoma.  Paternal aunt had colon cancer.    PLAN SUMMARY & DISPOSITION: Discharge to PCP  All questions were answered. The patient knows to call the clinic with any problems, questions or concerns.  Medical decision making: Low  Time spent on visit: I spent 15 minutes counseling the patient face to face. The total time spent in the appointment was 20 minutes and more than 50% was on counseling.   Harriett Rush, PA-C  01/23/2021 10:37 AM

## 2021-01-23 ENCOUNTER — Inpatient Hospital Stay (HOSPITAL_BASED_OUTPATIENT_CLINIC_OR_DEPARTMENT_OTHER): Payer: 59 | Admitting: Physician Assistant

## 2021-01-23 ENCOUNTER — Other Ambulatory Visit: Payer: Self-pay

## 2021-01-23 VITALS — BP 116/81 | HR 78 | Temp 98.4°F | Resp 18 | Ht 65.0 in | Wt 231.8 lb

## 2021-01-23 DIAGNOSIS — D72829 Elevated white blood cell count, unspecified: Secondary | ICD-10-CM | POA: Diagnosis not present

## 2021-01-23 NOTE — Patient Instructions (Signed)
[place] at [facility] Discharge Instructions  You were seen today by Tarri Abernethy PA-C for your elevated white blood cells and elevated platelets.  As we discussed, your blood levels are completely normal on your most recent lab work.  The previous elevations in your white blood cells and platelets are "reactive," meaning that they are your body's normal reaction to other stress, inflammation, and illness in your body.  We have checked for any underlying genetic abnormalities or types of blood cancer that would cause this, and those tests were negative.  You do not need to continue to follow-up with the hematology clinic, but should continue to see your primary care provider and have labs checked at least once per year.  If you have any significant elevations in your white blood cells or platelets (WBC greater than 20 or platelets greater than 500) that remain elevated even in the absence of other illness, you should be sent back to see [place] again at the hematology clinic.     Thank you for choosing [place] at [facility] to provide your oncology and hematology care.  To afford each patient quality time with our provider, please arrive at least 15 minutes before your scheduled appointment time.   If you have a lab appointment with the [facility] please come in thru the Main Entrance and check in at the main information desk.  You need to re-schedule your appointment should you arrive 10 or more minutes late.  We strive to give you quality time with our providers, and arriving late affects you and other patients whose appointments are after yours.  Also, if you no show three or more times for appointments you may be dismissed from the clinic at the providers discretion.     Again, thank you for choosing [facility].  Our hope is that these requests will decrease the amount of time that you wait before being seen by our physicians.        _____________________________________________________________  Should you have questions after your visit to [facility], please contact our office at [phone] and follow the prompts.  Our office hours are 8:00 a.m. and 4:30 p.m. Monday - Friday.  Please note that voicemails left after 4:00 p.m. may not be returned until the following business day.  We are closed weekends and major holidays.  You do have access to a nurse 24-7, just call the main number to the clinic [phone] and do not press any options, hold on the line and a nurse will answer the phone.    For prescription refill requests, have your pharmacy contact our office and allow 72 hours.    Due to Covid, you will need to wear a mask upon entering the hospital. If you do not have a mask, a mask will be given to you at the Main Entrance upon arrival. For doctor visits, patients may have 1 support person age 26 or older with them. For treatment visits, patients can not have anyone with them due to social distancing guidelines and our immunocompromised population.

## 2021-01-25 DIAGNOSIS — H8110 Benign paroxysmal vertigo, unspecified ear: Secondary | ICD-10-CM | POA: Insufficient documentation

## 2021-01-26 ENCOUNTER — Other Ambulatory Visit: Payer: Self-pay | Admitting: Physician Assistant

## 2021-01-29 ENCOUNTER — Encounter: Payer: Self-pay | Admitting: Internal Medicine

## 2021-01-30 ENCOUNTER — Other Ambulatory Visit: Payer: Self-pay

## 2021-01-30 ENCOUNTER — Other Ambulatory Visit: Payer: Self-pay | Admitting: Physician Assistant

## 2021-01-30 MED ORDER — ONDANSETRON HCL 4 MG PO TABS: 4.0000 mg | ORAL_TABLET | Freq: Three times a day (TID) | ORAL | 1 refills | Status: AC | PRN

## 2021-01-30 MED ORDER — LAMOTRIGINE 200 MG PO TABS: 500.0000 mg | ORAL_TABLET | Freq: Every day | ORAL | 0 refills | Status: DC

## 2021-01-30 MED ORDER — CLONAZEPAM 1 MG PO TABS: ORAL_TABLET | ORAL | 0 refills | Status: DC

## 2021-01-30 NOTE — Telephone Encounter (Signed)
Kristen [name] called to request refill of her Clonazepam and Lamictal.  The pharmacy told her they were waiting to her from the dr.  But she is almost out of both medications.  She has not done her labs yet because her husband has been sick and committed himself yesterday due to suicidal ideation.  Its her 1st christmas without her mother and things have just gone very smoothly.  She needs her medication to get through the holiday's.  She will make an appt. After the 1st of the year. Send to [facility] in [place], [place]

## 2021-02-15 ENCOUNTER — Encounter: Payer: Self-pay | Admitting: Cardiology

## 2021-02-16 ENCOUNTER — Encounter: Payer: Self-pay | Admitting: Cardiology

## 2021-02-21 ENCOUNTER — Telehealth: Payer: Self-pay

## 2021-02-21 DIAGNOSIS — R0609 Other forms of dyspnea: Secondary | ICD-10-CM

## 2021-02-21 DIAGNOSIS — R051 Acute cough: Secondary | ICD-10-CM

## 2021-02-21 DIAGNOSIS — R0602 Shortness of breath: Secondary | ICD-10-CM

## 2021-02-21 NOTE — Telephone Encounter (Signed)
-----   Message from Freada Bergeron, [place] sent at 02/19/2021  4:22 PM EST ----- Regarding: send referral please Please get her in with Dr. Charlsie Merles, [place] or [place] ASAP. Thanks

## 2021-02-22 ENCOUNTER — Ambulatory Visit: Payer: 59 | Admitting: Gastroenterology

## 2021-02-22 ENCOUNTER — Institutional Professional Consult (permissible substitution): Payer: 59 | Admitting: Internal Medicine

## 2021-02-26 ENCOUNTER — Ambulatory Visit: Payer: 59 | Admitting: Gastroenterology

## 2021-03-02 ENCOUNTER — Other Ambulatory Visit: Payer: Self-pay | Admitting: Physician Assistant

## 2021-03-05 ENCOUNTER — Encounter: Payer: Self-pay | Admitting: Cardiology

## 2021-03-08 ENCOUNTER — Other Ambulatory Visit: Payer: Self-pay

## 2021-03-08 ENCOUNTER — Encounter: Payer: Self-pay | Admitting: Internal Medicine

## 2021-03-08 ENCOUNTER — Ambulatory Visit: Payer: Self-pay | Admitting: Internal Medicine

## 2021-03-08 VITALS — BP 104/70 | HR 74 | Temp 98.0°F | Ht 65.0 in | Wt 231.0 lb

## 2021-03-08 DIAGNOSIS — R058 Other specified cough: Secondary | ICD-10-CM

## 2021-03-08 NOTE — Patient Instructions (Addendum)
Please schedule follow up scheduled with myself in 1 months.  If my schedule is not open yet, we will contact you with a reminder closer to that time. Please call [phone] if you haven't heard from [place] a month before.   Stop prednisone and antibiotics.   Stop albuterol nebulizer treatments. You can take albuterol as needed still for chest tightness or wheezing, not for cough.  Continue protonix twice a day with pepcid at night.   Continue zyrtec in the morning. Take benadryl at night for the next few weeks.   Use warm liquids to soothe your throat. Use honey and lemon. Avoid peppermint.   Avoid clearing your throat. This makes things worse.  Talk as little as possible! Sip on something to drink instead.

## 2021-03-08 NOTE — Progress Notes (Signed)
Kristen [name]    [id number]    08-18-1970  Primary Care Physician:Michaels, Eddie North, PA-C Date of Appointment: 03/08/2021 Established Patient Visit  Chief complaint:   Chief Complaint  Patient presents with   Follow-up    She is having a cough that is non productive. She has been on a lot of prednisone.      HPI: Kristen [name] is a 51 y.o. woman with history of previous tobacco use disorder here from shortness of breath.   Interval Updates: Here for follow up. She was doing well on prn albuterol. Things have gone down hill since Covid in October 2022. Treated as an outpatient with paxlovid. On her second round of prednisone in January. On albuterol prn and Has nebulizer treatments 4 times/day.   She is on anitbiotics and prednisone now with no improvement. She has a history of c. Diff with antibiotics use before.   Hasn't been able to wear CPAP because of her coughing. Has stopped HRT after menopause due to cancer risks. Having worsening night sweats, feeling fatigued, low energy.   Cough is dry. With occasional white mucus production.  Denies reflux, she is on protonix.   She saw allergy - Dr Neldon Mc who agreed symptoms of voice hoarseness are related to LPR.  Taking pepcid at night. She has also seen GI and had esophageal dilation. She is sleeping with a wedge pillow.   Here for follow up with PFTs for cough and shortness of breath. Cough is worst in the morning and brings up phlegm.  No blood. She is taking albuterol from her husband a couple times a day and seems to help her.   Albuterol isn't helping her breathing treatments also.     I have reviewed the patient's family social and past medical history and updated as appropriate.   Past Medical History:  Diagnosis Date   Allergies    Anemia    Anxiety    Arthritis    Atypical mole 11/03/2006   mid upper back (slight to moderate)   Atypical mole 11/03/2006   lower right back (slight to moderate)    Back pain    Bipolar 1 disorder (HCC)    Borderline diabetic    Chronic constipation    Constipation    Depression    Family history of adverse reaction to anesthesia    mother-- ponv   Fatty liver    GAD (generalized anxiety disorder)    GERD (gastroesophageal reflux disease)    Hiatal hernia    High triglycerides    History of kidney stones    History of recurrent UTIs    History of sepsis 06/2018   History of stomach ulcers    History of suicidal ideation    Hyperlipidemia    Hypothyroidism    IDA (iron deficiency anemia)    Joint pain    Melanoma ([place]) 11/03/2006   left chest in situ (excision)   Orthostasis    Palpitations    PONV (postoperative nausea and vomiting)    Prediabetes    Rapid heart rate    Renal calculus, left    Seasonal allergies    Shortness of breath    Sleep apnea    Sleep disorder, unspecified    per excessive sleeping during the day   Snoring    Swallowing difficulty    Tachycardia    Vertigo    Vitamin D deficiency    Weakness  Wears contact lenses     Past Surgical History:  Procedure Laterality Date   ANTERIOR CERVICAL DECOMP/DISCECTOMY FUSION  02/17/2012   Procedure: ANTERIOR CERVICAL DECOMPRESSION/DISCECTOMY FUSION 2 LEVELS;  Surgeon: Hosie Spangle, MD;  Location: [facility];  Service: Neurosurgery;  Laterality: N/A;  Cervical four-five and Cervical six-seven anterior cervial decompression with fusion plating and bonegraft   ANTERIOR CERVICAL DECOMP/DISCECTOMY FUSION  04-01-2001   '@[facility]'    C5---6   BIOPSY  07/20/2018   Procedure: BIOPSY;  Surgeon: Daneil Dolin, MD;  [facility]: [facility];  Service: Endoscopy;;  gastric    BREAST REDUCTION SURGERY Bilateral 1995   [place]  x2   last one 1998   with BILATERAL TUBAL LIGATION   CESAREAN SECTION WITH BILATERAL TUBAL LIGATION     COLONOSCOPY WITH PROPOFOL N/A 07/20/2018   Dr. Gala Romney: Diverticulosis, internal hemorrhoids, 5 mm splenic flexure tubular adenoma  removed. Repeat in 5 years due to family history.   COLONOSCOPY WITH PROPOFOL N/A 12/07/2020   Procedure: COLONOSCOPY WITH PROPOFOL;  Surgeon: Daneil Dolin, MD;  [facility]: [facility];  Service: Endoscopy;  Laterality: N/A;  2:30pm   CYSTOSCOPY/URETEROSCOPY/HOLMIUM LASER/STENT PLACEMENT Left 08/21/2018   Procedure: CYSTOSCOPY LEFT RETROGRADE PYELOGRAM /URETEROSCOPY/HOLMIUM LASER/STENT PLACEMENT;  Surgeon: Ceasar Mons, MD;  [facility];  Service: Urology;  Laterality: Left;   DILATION AND CURETTAGE OF UTERUS  1992   for miscarriage   ENDOMETRIAL ABLATION  2014   ESOPHAGOGASTRODUODENOSCOPY     20 years ago.    ESOPHAGOGASTRODUODENOSCOPY (EGD) WITH PROPOFOL N/A 07/20/2018   Dr. Gala Romney: Small hiatal hernia, erythematous mucosa in the stomach with a healing gastric ulcer measuring 1 cm, biopsies negative.   ESOPHAGOGASTRODUODENOSCOPY (EGD) WITH PROPOFOL N/A 10/05/2020   Procedure: ESOPHAGOGASTRODUODENOSCOPY (EGD) WITH PROPOFOL;  Surgeon: Daneil Dolin, MD;  [facility];  Service: Endoscopy;  Laterality: N/A;   MALONEY DILATION N/A 10/05/2020   Procedure: Venia Minks DILATION;  Surgeon: Daneil Dolin, MD;  [facility];  Service: Endoscopy;  Laterality: N/A;   [place]   unilateral for infertility   NASAL SINUS SURGERY  2010  approx.   POLYPECTOMY  07/20/2018   Procedure: POLYPECTOMY;  Surgeon: Daneil Dolin, MD;  [facility];  Service: Endoscopy;;   POSTERIOR CERVICAL FUSION/FORAMINOTOMY N/A 08/18/2013   Procedure: CERVICAL FOUR TO CERVICAL SEVEN POSTERIOR CERVICAL FUSION/FORAMINOTOMY LEVEL 3;  Surgeon: Hosie Spangle, MD;  Location: [facility];  Service: Neurosurgery;  Laterality: N/A;  C4-7 posterior cervical fusion with lateral mass fixation   RIGHT/LEFT HEART CATH AND CORONARY ANGIOGRAPHY N/A 12/23/2019   Procedure: RIGHT/LEFT HEART CATH AND CORONARY ANGIOGRAPHY;  Surgeon: Burnell Blanks,  MD;  Location: [facility];  Service: Cardiovascular;  Laterality: N/A;    Family History  Problem Relation Age of Onset   Asthma Mother    Rheum arthritis Mother    Non-Hodgkin's lymphoma Mother    Congestive Heart Failure Mother    Atrial fibrillation Mother    Diabetes Mother    Hyperlipidemia Mother    Cancer Mother    Colon cancer Father    Multiple myeloma Father    Hypertension Father    Cancer Father    Depression Father    Anxiety disorder Father    Alcoholism Father    Obesity Father    Allergies Other        entire family(mom and dad)   Diabetes Maternal Grandmother  Congestive Heart Failure Maternal Grandmother    Mental illness Paternal Grandmother    Colon cancer Paternal Grandfather    Bone cancer Paternal Grandfather     Social History   Occupational History   Occupation: disability  Tobacco Use   Smoking status: Former    Packs/day: 1.00    Years: 5.00    Pack years: 5.00    Types: Cigarettes    Quit date: 02/09/2002    Years since quitting: 19.0   Smokeless tobacco: Never  Vaping Use   Vaping Use: Never used  Substance and Sexual Activity   Alcohol use: No   Drug use: No   Sexual activity: Yes    Birth control/protection: Surgical     Physical Exam: Blood pressure 104/70, pulse 74, temperature 98 F (36.7 C), temperature source Oral, height '5\' 5"'  (1.651 m), weight 231 lb (104.8 kg), SpO2 96 %.  Gen:      No acute distress, hoarse voice, frequent coughing and throat clearing. ENT:   no nasal polyps, mucus membranes moist, no thrush, +cobblestoning in oropharynx Lungs:    ctab no wheezes or crackles CV:         RRR no mrg   Data Reviewed: Imaging: I have personally reviewed the chest xray from July 2021 which shows no acute cardiopulmonary process.   PFTs:   PFT Results Latest Ref Rng & Units 04/17/2020  FVC-Pre L 3.48  FVC-Predicted Pre % 89  FVC-Post L 3.49  FVC-Predicted Post % 90  Pre FEV1/FVC % % 84  Post FEV1/FCV  % % 88  FEV1-Pre L 2.93  FEV1-Predicted Pre % 95  FEV1-Post L 3.06  DLCO uncorrected ml/min/mmHg 22.49  DLCO UNC% % 97  DLCO corrected ml/min/mmHg 22.49  DLCO COR %Predicted % 97  DLVA Predicted % 104  TLC L 5.29  TLC % Predicted % 97  RV % Predicted % 91   I have personally reviewed the patient's PFTs and normal spirometry, lung volumes, diffusion capacity.   Labs:  Immunization status: Immunization History  Administered Date(s) Administered   Influenza,inj,Quad PF,6+ Mos 11/11/2015   Influenza-Unspecified 12/01/2019   Moderna Sars-Covid-2 Vaccination 04/07/2019, 05/05/2019, 12/13/2019    Assessment:  Chronic Cough likely component of post-viral cough which has worsened Covid 19 infection October 2022 Chronic Rhinitis GERD with LPR  Plan/Recommendations: Suspect this is post-viral cough which has spiraled out of control with addition of scheduled nebulizer treatments, frequent talking and throat clearing, reflux.  Continue BID PPI and pepcid at night. Stop albuterol nebulizer treaments. Add benadryl at night Warm liquids, honey.   Stop antibiotics and steroids - they are not helping.  Return to Care: Return in about 4 weeks (around 04/05/2021).   Lenice Llamas, MD [facility] [place]

## 2021-03-14 ENCOUNTER — Ambulatory Visit (INDEPENDENT_AMBULATORY_CARE_PROVIDER_SITE_OTHER): Payer: 59 | Admitting: Physician Assistant

## 2021-03-14 ENCOUNTER — Encounter: Payer: Self-pay | Admitting: Physician Assistant

## 2021-03-14 ENCOUNTER — Other Ambulatory Visit: Payer: Self-pay

## 2021-03-14 DIAGNOSIS — E032 Hypothyroidism due to medicaments and other exogenous substances: Secondary | ICD-10-CM

## 2021-03-14 DIAGNOSIS — F411 Generalized anxiety disorder: Secondary | ICD-10-CM | POA: Diagnosis not present

## 2021-03-14 DIAGNOSIS — F5105 Insomnia due to other mental disorder: Secondary | ICD-10-CM

## 2021-03-14 DIAGNOSIS — G2581 Restless legs syndrome: Secondary | ICD-10-CM

## 2021-03-14 DIAGNOSIS — F319 Bipolar disorder, unspecified: Secondary | ICD-10-CM

## 2021-03-14 DIAGNOSIS — Z6379 Other stressful life events affecting family and household: Secondary | ICD-10-CM

## 2021-03-14 DIAGNOSIS — F99 Mental disorder, not otherwise specified: Secondary | ICD-10-CM

## 2021-03-14 DIAGNOSIS — G4733 Obstructive sleep apnea (adult) (pediatric): Secondary | ICD-10-CM

## 2021-03-14 MED ORDER — OXCARBAZEPINE 300 MG PO TABS: 900.0000 mg | ORAL_TABLET | Freq: Every day | ORAL | 3 refills | Status: DC

## 2021-03-14 MED ORDER — HYDROXYZINE HCL 25 MG PO TABS: 25.0000 mg | ORAL_TABLET | Freq: Three times a day (TID) | ORAL | 3 refills | Status: DC | PRN

## 2021-03-14 MED ORDER — CLONAZEPAM 1 MG PO TABS: ORAL_TABLET | ORAL | 5 refills | Status: DC

## 2021-03-14 NOTE — Progress Notes (Signed)
[facility] Check  Patient ID: OZELL FERRERA,  MRN: [id number]  PCP: Barrie Lyme  Date of Evaluation: 03/14/2021 Time spent:40 minutes  Chief Complaint:  Chief Complaint   Anxiety; Depression; Insomnia; Follow-up     HISTORY/CURRENT STATUS: HPI For routine med check.  Still very stressed. Her husband is still having seizures. She has to take him everywhere b/c he can't drive. He's very depressed and even checked himself into [facility] behavioral health to get help.  He is seeing a provider at [facility].  Neither of them are able to work so stretched thin financially as well.  Also she did not go to [place] to be there for the birth of her first grandchild due to her husband's illness.  Their son and daughter-in-law will not send them pictures of the baby.  She is having trouble with her stepdad will not let her in her mother's house.  Her mom died a few years ago.  Mashal herself has had some health problems with diverticulitis and then C. difficile, then COVID that has caused a cough for the past 4 months now.  She has had to have endoscopy with esophageal dilatation, colonoscopy, several tests pulmonology tests, "and the list goes on and on."  Complains of sweating.  States it is day and night.  She has seen her GYN about it and they do say that she is in menopause.  She did not want to take the estrogen.  States that even with all of these things going on that she is doing as well as she can be mentally.  Feels like her medications are working.  Personal hygiene is normal.  No suicidal or homicidal thoughts.  Patient denies increased energy with decreased need for sleep, no increased talkativeness, no racing thoughts, no impulsivity or risky behaviors, no increased spending, no increased libido, no grandiosity, no increased irritability or anger, and no hallucinations.  Denies dizziness, syncope, seizures, numbness, tingling, tremor, tics, unsteady gait,  slurred speech, confusion. Denies muscle or joint pain, stiffness, or dystonia.   Individual Medical History/ Review of Systems: Changes? :Yes    diagnosed with sleep apnea now using CPAP.  Past medications for mental health diagnoses include: Prozac, Paxil, Lexapro, Celexa, Zoloft, Viibryd, Effexor, Cymbalta, Risperdal, Latuda, Lamictal, VPA, Lithium-became toxic, Xanax, Wellbutrin, Abilify, Willette Al[place], Strattera, Kapvay, Saphris, Adderall, Vyvanse, Lunesta, Sonata,  Vraylar, Seroquel, Geodon, Tegretal  Allergies: Iohexol and Rosuvastatin  Current Medications:  Current Outpatient Medications:    acetaminophen (TYLENOL) 500 MG tablet, Take 1,000 mg by mouth every 6 (six) hours as needed for moderate pain., Disp: , Rfl:    aspirin EC 81 MG tablet, Take 1 tablet (81 mg total) by mouth daily. Swallow whole., Disp: 30 tablet, Rfl: 11   buPROPion (WELLBUTRIN XL) 150 MG 24 hr tablet, Take 3 tablets (450 mg total) by mouth daily., Disp: 270 tablet, Rfl: 1   cetirizine (ZYRTEC) 10 MG tablet, Take 10 mg by mouth daily., Disp: , Rfl:    EVENING PRIMROSE OIL PO, Take by mouth., Disp: , Rfl:    ezetimibe (ZETIA) 10 MG tablet, Take 1 tablet (10 mg total) by mouth daily., Disp: 30 tablet, Rfl: 0   hydrocortisone (ANUSOL-HC) 2.5 % rectal cream, Place 1 application rectally 2-4 times daily for rectal bleeding/burning related to hemorrhoids. (Patient taking differently: Place 1 application rectally 4 (four) times daily as needed for hemorrhoids. P\), Disp: 30 g, Rfl: 1   lamoTRIgine (LAMICTAL) 200 MG tablet, TAKE 2 & 1/2 (  TWO & ONE-HALF) TABLETS BY MOUTH ONCE DAILY, Disp: 75 tablet, Rfl: 0   levothyroxine (SYNTHROID) 75 MCG tablet, Take 75 mcg by mouth daily before breakfast. , Disp: , Rfl:    meclizine (ANTIVERT) 25 MG tablet, Take 25 mg by mouth 3 (three) times daily as needed for dizziness., Disp: , Rfl:    montelukast (SINGULAIR) 10 MG tablet, Take 10 mg by mouth daily. , Disp: , Rfl:    ondansetron (ZOFRAN) 4  MG tablet, Take 1 tablet (4 mg total) by mouth every 8 (eight) hours as needed for nausea or vomiting., Disp: 30 tablet, Rfl: 1   pantoprazole (PROTONIX) 40 MG tablet, Take 1 tablet (40 mg total) by mouth 2 (two) times daily with a meal., Disp: 180 tablet, Rfl: 3   rosuvastatin (CRESTOR) 5 MG tablet, Take 1 tablet (5 mg total) by mouth daily. (Patient taking differently: Take 5 mg by mouth at bedtime.), Disp: 90 tablet, Rfl: 3   sertraline (ZOLOFT) 100 MG tablet, Take 3 tablets (300 mg total) by mouth at bedtime., Disp: 90 tablet, Rfl: 5   albuterol (VENTOLIN HFA) 108 (90 Base) MCG/ACT inhaler, Inhale 2 puffs into the lungs every 6 (six) hours as needed for wheezing or shortness of breath. (Patient not taking: Reported on 03/14/2021), Disp: 18 g, Rfl: 1   clonazePAM (KLONOPIN) 1 MG tablet, 1 p.o. 3 times daily as needed anxiety and may repeat 1/2 pill during the middle of the day as needed anxiety.  No more than 3.5 mg/day., Disp: 105 tablet, Rfl: 5   estradiol (ESTRACE) 1 MG tablet, Take 1 mg by mouth daily. (Patient not taking: Reported on 03/14/2021), Disp: , Rfl:    fenofibrate (TRICOR) 145 MG tablet, Take 1 tablet (145 mg total) by mouth daily. (Patient not taking: Reported on 03/14/2021), Disp: 30 tablet, Rfl: 11   Fluticasone-Salmeterol,sensor, (AIRDUO DIGIHALER) 232-14 MCG/ACT AEPB, Inhale 1 puff into the lungs 2 (two) times daily. (Patient not taking: Reported on 03/14/2021), Disp: 1 each, Rfl: 2   hydrOXYzine (ATARAX) 25 MG tablet, Take 1 tablet (25 mg total) by mouth 3 (three) times daily as needed., Disp: 180 tablet, Rfl: 3   icosapent Ethyl (VASCEPA) 1 g capsule, Take 2 capsules (2 g total) by mouth 2 (two) times daily. (Patient not taking: Reported on 03/14/2021), Disp: 360 capsule, Rfl: 3   levocetirizine (XYZAL) 5 MG tablet, Take 5 mg by mouth every evening. (Patient not taking: Reported on 03/14/2021), Disp: , Rfl:    metFORMIN (GLUCOPHAGE) 500 MG tablet, Take 1 tablet (500 mg total) by mouth 2  (two) times daily with a meal. (Patient not taking: Reported on 03/14/2021), Disp: 60 tablet, Rfl: 0   Oxcarbazepine (TRILEPTAL) 300 MG tablet, Take 3 tablets (900 mg total) by mouth at bedtime., Disp: 270 tablet, Rfl: 3   polyethylene glycol-electrolytes (TRILYTE) 420 g solution, Take 4,000 mLs by mouth as directed. (Patient not taking: Reported on 03/14/2021), Disp: 4000 mL, Rfl: 0 Medication Side Effects:  Sweating  Family Medical/ Social History: Changes?  husband has psychogenic nonepileptic seizures.   MENTAL HEALTH EXAM:  There were no vitals taken for this visit.There is no height or weight on file to calculate BMI.  General Appearance: Casual, Neat, Well Groomed and Obese  Eye Contact:  Good  Speech:  Clear and Coherent and Normal Rate  Volume:  Normal  Mood:  Anxious  Affect:  Congruent, Labile, and Anxious  Thought Process:  Goal Directed and Descriptions of Associations: Intact  Orientation:  Full (Time, Place, and Person)  Thought Content: Logical   Suicidal Thoughts:  No  Homicidal Thoughts:  No  Memory:  WNL  Judgement:  Good  Insight:  Good  Psychomotor Activity:  Normal  Concentration:  Concentration: Good and Attention Span: Good  Recall:  Good  Fund of Knowledge: Good  Language: Good  Assets:  Desire for Improvement  ADL's:  Intact  Cognition: WNL  Prognosis:  Good   Lab results faxed to me-collected 10/19/2020 Oxcarbazepine level 13.  Reference 10-35 Lamictal level 6.1 reference to 2-20  Labs 01/16/2021 CBC normal CMP Glucose 114 otherwise nl  DIAGNOSES:    ICD-10-CM   1. Bipolar I disorder ([place])  F31.9     2. Generalized anxiety disorder  F41.1     3. Insomnia due to other mental disorder  F51.05    F99     4. Stress due to illness of family member  Z63.79     89. Obstructive sleep apnea  G47.33     6. Restless legs  G25.81     7. Hypothyroidism due to medication  E03.2        Receiving Psychotherapy: Yes      RECOMMENDATIONS:  PDMP was  reviewed.  Last Klonopin filled 01/30/2021.  Last Sonata filled 11/30/2020. I provided 40 minutes of face to face time during this encounter, including time spent before and after the visit in records review, medical decision making, counseling pertinent to today's visit, and charting.  Discussed sweating.  This is likely coming from Zoloft and decreasing the dose may be beneficial.  She states she cannot decrease it right now because of everything that is going on she does not want to rock the boat and she will put up with the sweating. Discussed exercise even if it is 15 minutes a day walking in place, drink plenty of fluids especially since she sweats so much she needs more water, and try to eat more vegetables and less carbs. No changes in meds will be made. Continue Klonopin 1 mg 1 p.o. 3 times daily as needed. Continue hydroxyzine 25 mg, 1 p.o. 3 times daily as needed. Continue Zoloft 100 mg, 3 p.o. daily. Continue Wellbutrin XL 150 mg, 3 p.o. every morning. Continue Trileptal 300 mg, 3 nightly. Continue Lamictal 200 mg, 2.5 pills daily. Continue counseling.   Return in 3 months.  [name], PA-C

## 2021-03-15 ENCOUNTER — Encounter: Payer: Self-pay | Admitting: Internal Medicine

## 2021-03-15 DIAGNOSIS — R053 Chronic cough: Secondary | ICD-10-CM

## 2021-03-15 NOTE — Telephone Encounter (Signed)
Dr. Desai, please see mychart message sent by pt and advise. 

## 2021-03-16 MED ORDER — CHLORPHENIRAMINE MALEATE 4 MG PO TABS: 4.0000 mg | ORAL_TABLET | Freq: Every day | ORAL | 0 refills | Status: DC

## 2021-03-21 ENCOUNTER — Other Ambulatory Visit: Payer: Self-pay

## 2021-03-21 ENCOUNTER — Telehealth: Payer: Self-pay | Admitting: Physician Assistant

## 2021-03-21 DIAGNOSIS — R000 Tachycardia, unspecified: Secondary | ICD-10-CM

## 2021-03-21 DIAGNOSIS — E782 Mixed hyperlipidemia: Secondary | ICD-10-CM

## 2021-03-21 DIAGNOSIS — R0609 Other forms of dyspnea: Secondary | ICD-10-CM

## 2021-03-21 DIAGNOSIS — E781 Pure hyperglyceridemia: Secondary | ICD-10-CM

## 2021-03-21 DIAGNOSIS — R0683 Snoring: Secondary | ICD-10-CM

## 2021-03-21 MED ORDER — EZETIMIBE 10 MG PO TABS: 10.0000 mg | ORAL_TABLET | Freq: Every day | ORAL | 0 refills | Status: DC

## 2021-03-21 MED ORDER — CLONAZEPAM 1 MG PO TABS: ORAL_TABLET | ORAL | 0 refills | Status: DC

## 2021-03-21 NOTE — Telephone Encounter (Signed)
Pt called reporting [facility] out of Clonazepam. Asking for 1 month Rx for Clonazepam to [facility].

## 2021-03-21 NOTE — Telephone Encounter (Signed)
Pended.

## 2021-03-27 ENCOUNTER — Telehealth: Payer: Self-pay | Admitting: Physician Assistant

## 2021-03-27 NOTE — Telephone Encounter (Signed)
You are correct, it was her husband who was having seizures.  Your recommendations were given. She said no event that has precipitated this behavior.

## 2021-03-27 NOTE — Telephone Encounter (Signed)
Pt lvm that she is going through a major depression. She slept for two days. She got up today and took a shower.. Every bone hurts she doesn't have much of an appetite. She is agitated. She feels a little better today. No suicidal thoughts but feels that her medicine needs to be adjusted. Please call her at [phone]

## 2021-03-27 NOTE — Telephone Encounter (Signed)
Please clarify with her, I think it is her husband who is having seizures, not her.  Is this new for her?  She was doing fairly well 2 weeks ago, has something else happened?  I think the best thing at this point is have her take the hydroxyzine 4 times a day as needed, when she starts to get agitated or anxious take it.

## 2021-03-27 NOTE — Telephone Encounter (Signed)
She phone message. Called patient. She said she is very depressed. Has been dealing with seizures since April. She is "hiding behind her phone" and says hurtful things to people thru texts, and then they are mad at her.  She said maybe because she is hurting she is wanting everyone else to hurt. Denies SI. No recent stressors.

## 2021-03-27 NOTE — Telephone Encounter (Signed)
Noted  

## 2021-03-29 ENCOUNTER — Ambulatory Visit (INDEPENDENT_AMBULATORY_CARE_PROVIDER_SITE_OTHER): Payer: 59 | Admitting: Physician Assistant

## 2021-03-29 ENCOUNTER — Encounter: Payer: Self-pay | Admitting: Physician Assistant

## 2021-03-29 ENCOUNTER — Other Ambulatory Visit: Payer: Self-pay

## 2021-03-29 DIAGNOSIS — E6609 Other obesity due to excess calories: Secondary | ICD-10-CM

## 2021-03-29 DIAGNOSIS — F411 Generalized anxiety disorder: Secondary | ICD-10-CM

## 2021-03-29 DIAGNOSIS — G4733 Obstructive sleep apnea (adult) (pediatric): Secondary | ICD-10-CM

## 2021-03-29 DIAGNOSIS — F319 Bipolar disorder, unspecified: Secondary | ICD-10-CM | POA: Diagnosis not present

## 2021-03-29 DIAGNOSIS — F5105 Insomnia due to other mental disorder: Secondary | ICD-10-CM

## 2021-03-29 DIAGNOSIS — F32A Depression, unspecified: Secondary | ICD-10-CM | POA: Diagnosis not present

## 2021-03-29 DIAGNOSIS — R5383 Other fatigue: Secondary | ICD-10-CM

## 2021-03-29 DIAGNOSIS — F99 Mental disorder, not otherwise specified: Secondary | ICD-10-CM

## 2021-03-29 MED ORDER — HYDROXYZINE HCL 25 MG PO TABS: 25.0000 mg | ORAL_TABLET | Freq: Four times a day (QID) | ORAL | 3 refills | Status: DC | PRN

## 2021-03-29 NOTE — Progress Notes (Signed)
[facility]  Patient ID: Kristen [name],  MRN: [id number]  PCP: Barrie Lyme  Date of Evaluation: 03/29/2021 Time spent:40 minutes  Chief Complaint:  Chief Complaint   Depression; Anxiety; Fatigue; Follow-up      HISTORY/CURRENT STATUS: HPI For routine med check.  States when she was here last week, she wasn't completely honest about her depression. Says it's really bad. Worries about her husband, who has seizures, worries about their son and his wife who live in [place] (he is in the [employer]) and they have daughter.  They have completely cut her out of their lives and rarely send pictures of the baby to her.  She has to keep everything bottled up b/c she can't stress her husband out. All she wants to do is sleep. Tired all the time whether she sleeps all day or not. Has decreased hygiene. Feels very alone. Appetite is increased, eating a lot of junk food, "I'm an emotional eater. I'm addicted to sugar." Is seeing a counselor every 2 weeks. It's helpful. No suicidal or homicidal thoughts.   Anxiety is still a problem but the Klonopin is really helpful.  Doing better than the Xanax did.  She takes it every day, if she did not she would be having panic attacks all the time.  Patient denies increased energy with decreased need for sleep, no increased talkativeness, no racing thoughts, no impulsivity or risky behaviors, no increased spending, no increased libido, no grandiosity, no increased irritability or anger, no paranoia, and no hallucinations.  Review of Systems  Constitutional:  Positive for malaise/fatigue.  HENT: Negative.    Eyes: Negative.   Respiratory: Negative.    Cardiovascular: Negative.   Gastrointestinal: Negative.   Genitourinary: Negative.   Musculoskeletal: Negative.   Skin: Negative.   Neurological: Negative.   Endo/Heme/Allergies: Negative.   Psychiatric/Behavioral:         See HPI    Individual Medical History/ Review of Systems:  Changes? :Yes    diagnosed with sleep apnea now using CPAP.  Past medications for mental health diagnoses include: Prozac, Paxil, Lexapro, Celexa, Zoloft, Viibryd, Effexor, Cymbalta, Risperdal, Latuda, Lamictal, VPA, Lithium-became toxic, Xanax, Wellbutrin, Abilify, Willette Alma, Strattera, Kapvay, Saphris, Adderall, Vyvanse, Lunesta, Sonata,  Vraylar, Seroquel, Geodon, Tegretal  Allergies: Iohexol and Rosuvastatin  Current Medications:  Current Outpatient Medications:    acetaminophen (TYLENOL) 500 MG tablet, Take 1,000 mg by mouth every 6 (six) hours as needed for moderate pain., Disp: , Rfl:    aspirin EC 81 MG tablet, Take 1 tablet (81 mg total) by mouth daily. Swallow whole., Disp: 30 tablet, Rfl: 11   buPROPion (WELLBUTRIN XL) 150 MG 24 hr tablet, Take 3 tablets (450 mg total) by mouth daily., Disp: 270 tablet, Rfl: 1   cetirizine (ZYRTEC) 10 MG tablet, Take 10 mg by mouth daily., Disp: , Rfl:    chlorpheniramine (SB CHLORPHENIRAMINE) 4 MG tablet, Take 1 tablet (4 mg total) by mouth at bedtime., Disp: 14 tablet, Rfl: 0   clonazePAM (KLONOPIN) 1 MG tablet, 1 p.o. 3 times daily as needed anxiety and may repeat 1/2 pill during the middle of the day as needed anxiety.  No more than 3.5 mg/day., Disp: 105 tablet, Rfl: 0   EVENING PRIMROSE OIL PO, Take by mouth., Disp: , Rfl:    ezetimibe (ZETIA) 10 MG tablet, Take 1 tablet (10 mg total) by mouth daily. Please make overdue appt with Dr. Johney Frame before anymore refills. Thank you 2nd attempt, Disp: 15 tablet, Rfl: 0  hydrocortisone (ANUSOL-HC) 2.5 % rectal cream, Place 1 application rectally 2-4 times daily for rectal bleeding/burning related to hemorrhoids. (Patient taking differently: Place 1 application rectally 4 (four) times daily as needed for hemorrhoids. P\), Disp: 30 g, Rfl: 1   lamoTRIgine (LAMICTAL) 200 MG tablet, TAKE 2 & 1/2 (TWO & ONE-HALF) TABLETS BY MOUTH ONCE DAILY, Disp: 75 tablet, Rfl: 0   levothyroxine (SYNTHROID) 75 MCG tablet, Take  75 mcg by mouth daily before breakfast. , Disp: , Rfl:    meclizine (ANTIVERT) 25 MG tablet, Take 25 mg by mouth 3 (three) times daily as needed for dizziness., Disp: , Rfl:    montelukast (SINGULAIR) 10 MG tablet, Take 10 mg by mouth daily. , Disp: , Rfl:    Oxcarbazepine (TRILEPTAL) 300 MG tablet, Take 3 tablets (900 mg total) by mouth at bedtime., Disp: 270 tablet, Rfl: 3   pantoprazole (PROTONIX) 40 MG tablet, Take 1 tablet (40 mg total) by mouth 2 (two) times daily with a meal., Disp: 180 tablet, Rfl: 3   rosuvastatin (CRESTOR) 5 MG tablet, Take 1 tablet (5 mg total) by mouth daily. (Patient taking differently: Take 5 mg by mouth at bedtime.), Disp: 90 tablet, Rfl: 3   sertraline (ZOLOFT) 100 MG tablet, Take 3 tablets (300 mg total) by mouth at bedtime., Disp: 90 tablet, Rfl: 5   albuterol (VENTOLIN HFA) 108 (90 Base) MCG/ACT inhaler, Inhale 2 puffs into the lungs every 6 (six) hours as needed for wheezing or shortness of breath. (Patient not taking: Reported on 03/14/2021), Disp: 18 g, Rfl: 1   estradiol (ESTRACE) 1 MG tablet, Take 1 mg by mouth daily. (Patient not taking: Reported on 03/14/2021), Disp: , Rfl:    fenofibrate (TRICOR) 145 MG tablet, Take 1 tablet (145 mg total) by mouth daily. (Patient not taking: Reported on 03/14/2021), Disp: 30 tablet, Rfl: 11   Fluticasone-Salmeterol,sensor, (AIRDUO DIGIHALER) 232-14 MCG/ACT AEPB, Inhale 1 puff into the lungs 2 (two) times daily. (Patient not taking: Reported on 03/14/2021), Disp: 1 each, Rfl: 2   hydrOXYzine (ATARAX) 25 MG tablet, Take 1-2 tablets (25-50 mg total) by mouth every 6 (six) hours as needed., Disp: 180 tablet, Rfl: 3   icosapent Ethyl (VASCEPA) 1 g capsule, Take 2 capsules (2 g total) by mouth 2 (two) times daily. (Patient not taking: Reported on 03/14/2021), Disp: 360 capsule, Rfl: 3   levocetirizine (XYZAL) 5 MG tablet, Take 5 mg by mouth every evening. (Patient not taking: Reported on 03/14/2021), Disp: , Rfl:    metFORMIN (GLUCOPHAGE) 500  MG tablet, Take 1 tablet (500 mg total) by mouth 2 (two) times daily with a meal. (Patient not taking: Reported on 03/14/2021), Disp: 60 tablet, Rfl: 0   polyethylene glycol-electrolytes (TRILYTE) 420 g solution, Take 4,000 mLs by mouth as directed. (Patient not taking: Reported on 03/14/2021), Disp: 4000 mL, Rfl: 0 Medication Side Effects:  Sweating  Family Medical/ Social History: Changes?  See HPI. Nothing new  MENTAL HEALTH EXAM:  There were no vitals taken for this visit.There is no height or weight on file to calculate BMI.  General Appearance: Casual, Neat, Well Groomed and Obese  Eye Contact:  Fair  Speech:  Clear and Coherent and Normal Rate  Volume:  Normal  Mood:  Depressed  Affect:  Depressed and Tearful  Thought Process:  Goal Directed and Descriptions of Associations: Circumstantial  Orientation:  Full (Time, Place, and Person)  Thought Content: Logical   Suicidal Thoughts:  No  Homicidal Thoughts:  No  Memory:  WNL  Judgement:  Good  Insight:  Good  Psychomotor Activity:  Normal  Concentration:  Concentration: Good and Attention Span: Good  Recall:  Good  Fund of Knowledge: Good  Language: Good  Assets:  Desire for Improvement  ADL's:  Intact  Cognition: WNL  Prognosis:  Good   Lab results faxed to me-collected 10/19/2020 Oxcarbazepine level 13.  Reference 10-35 Lamictal level 6.1 reference to 2-20  Labs 01/16/2021 CBC normal CMP Glucose 114 otherwise nl  01/15/2021 TSH 1.9  DIAGNOSES:    ICD-10-CM   1. Bipolar I disorder with depression ([place])  F31.9     2. Fatigue due to depression  F32.A    R53.83     3. Generalized anxiety disorder  F41.1     4. Insomnia due to other mental disorder  F51.05    F99     5. Obstructive sleep apnea  G47.33     6. Obesity due to excess calories without serious comorbidity, unspecified classification  E66.09         Receiving Psychotherapy: Yes    Felix Ahmadi  RECOMMENDATIONS:  PDMP was reviewed.  Last Klonopin  filled 03/21/2021.  Last Sonata filled 11/30/2020.  Hydrocodone filled 02/16/2021. I provided 40 minutes of face to face time during this encounter, including time spent before and after the visit in records review, medical decision making, counseling pertinent to today's visit, and charting.  We discussed her diagnosis of depression.  A lot of what she is dealing with is circumstantial, and unfortunately cannot be changed.  I am glad that she is going to therapy regularly, and I know that will continue to be helpful.  She may need a medication dose change or change in meds altogether.  She has never had a Gene site test done.  I recommend that we do it, it will be helpful for me to know what drug (s) might be more beneficial to her.  Pros and cons were discussed.  She would like to have it done.  No changes will be made until the results return. Gene site specimen obtained.  Continue Klonopin 1 mg 1 p.o. 3 times daily as needed, with occasional extra one half p.o. midday as needed. Continue hydroxyzine 25 mg, 1 p.o. 3 times daily as needed. Continue Zoloft 100 mg, 3 p.o. daily. Continue Wellbutrin XL 150 mg, 3 p.o. every morning. Continue Trileptal 300 mg, 3 nightly. Continue Lamictal 200 mg, 2.5 pills daily. Continue counseling with Felix Ahmadi.  Return in 2 weeks.  [name], PA-C

## 2021-04-02 ENCOUNTER — Telehealth: Payer: Self-pay | Admitting: Internal Medicine

## 2021-04-02 NOTE — Telephone Encounter (Signed)
I called [facility] & left vm for Shayla regarding status of referral.  I faxed it to them on 2/6.

## 2021-04-03 ENCOUNTER — Encounter: Payer: Self-pay | Admitting: Cardiology

## 2021-04-04 ENCOUNTER — Other Ambulatory Visit: Payer: Self-pay | Admitting: Physician Assistant

## 2021-04-04 NOTE — Telephone Encounter (Signed)
[facility] & spoke to [place].  She states they are behind on referrals but she went ahead and scheduled pt for first available appt on on 3/27 at  3:45 with Dr West Carbo.  She is going to put pt on cancellation list.  I left vm for pt to call me back.

## 2021-04-05 ENCOUNTER — Ambulatory Visit: Payer: Self-pay | Admitting: Internal Medicine

## 2021-04-05 NOTE — Telephone Encounter (Signed)
Spoke to pt & gave her appt info.  Nothing further needed. 

## 2021-04-06 ENCOUNTER — Ambulatory Visit: Payer: 59 | Admitting: Physician Assistant

## 2021-04-10 ENCOUNTER — Telehealth: Payer: Self-pay | Admitting: Internal Medicine

## 2021-04-10 NOTE — Telephone Encounter (Signed)
PATIENT WOULD LIKE TO TRY SOMETHING ELSE FOR CONSTIPATION IF POSSIBLE.  THE LINZESS WAS TOO EXPENSIVE.  SHE HAS AN APPOINTMENT IN [place]

## 2021-04-10 NOTE — Telephone Encounter (Signed)
I'm not sure that any other prescriptive agent will be any cheaper than Linezss at $35. We can try submitting for patient assistance.   In the mean time, she can try adding colace 100 mg daily. If not strong enough after 1 week, can increase to 100 mg twice daily. Let us know if ongoing constipation issues.

## 2021-04-10 NOTE — Telephone Encounter (Signed)
Pt states that the linzess was $35 but that her financial situation has changed and she is unable to afford that monthly. Pt states that she is currently taking miralax twice daily and it isn't really helping. Pt is wanting to know what else can be done.

## 2021-04-11 NOTE — Telephone Encounter (Signed)
Noted  

## 2021-04-11 NOTE — Telephone Encounter (Signed)
Pt was made aware and states that she will try the colace and let us know how that works.  ?

## 2021-04-12 ENCOUNTER — Encounter: Payer: Self-pay | Admitting: Internal Medicine

## 2021-04-13 ENCOUNTER — Ambulatory Visit: Payer: 59 | Admitting: Physician Assistant

## 2021-04-13 ENCOUNTER — Other Ambulatory Visit: Payer: Self-pay | Admitting: Gastroenterology

## 2021-04-16 ENCOUNTER — Telehealth: Payer: Self-pay | Admitting: Internal Medicine

## 2021-04-16 NOTE — Telephone Encounter (Signed)
Seen Dr. Gala Romney last on 11/02/2020. Pt was wanting to find a cheaper alternative and was instructed to try colace, pt would rather just stay with the Linzess and pay the $35 copay. Can refills be sent in for this.  ?

## 2021-04-16 NOTE — Telephone Encounter (Signed)
Pt was made aware.  

## 2021-04-16 NOTE — Telephone Encounter (Signed)
Pt was asking why her Linzess didn't get filled, but her zofran did. She had asked for a refill for both and said her pharmacy sent the refill requests.  Please call [phone] ?

## 2021-04-16 NOTE — Telephone Encounter (Signed)
I am sending in refill for Linzess 290 mcg daily.  ?

## 2021-04-17 ENCOUNTER — Encounter (HOSPITAL_COMMUNITY): Payer: Self-pay | Admitting: *Deleted

## 2021-04-17 ENCOUNTER — Emergency Department (HOSPITAL_COMMUNITY)
Admission: EM | Admit: 2021-04-17 | Discharge: 2021-04-17 | Disposition: A | Discharge status: Home / Self Care | Payer: 59 | Attending: Emergency Medicine | Admitting: Emergency Medicine

## 2021-04-17 ENCOUNTER — Encounter: Payer: Self-pay | Admitting: Physician Assistant

## 2021-04-17 DIAGNOSIS — Z5321 Procedure and treatment not carried out due to patient leaving prior to being seen by health care provider: Secondary | ICD-10-CM | POA: Insufficient documentation

## 2021-04-17 DIAGNOSIS — K5900 Constipation, unspecified: Secondary | ICD-10-CM | POA: Diagnosis not present

## 2021-04-17 DIAGNOSIS — R1032 Left lower quadrant pain: Secondary | ICD-10-CM | POA: Insufficient documentation

## 2021-04-17 LAB — URINALYSIS, ROUTINE W REFLEX MICROSCOPIC
Bilirubin Urine: NEGATIVE
Glucose, UA: NEGATIVE mg/dL
Hgb urine dipstick: NEGATIVE
Ketones, ur: 5 mg/dL — AB
Leukocytes,Ua: NEGATIVE
Nitrite: NEGATIVE
Protein, ur: NEGATIVE mg/dL
Specific Gravity, Urine: 1.024 (ref 1.005–1.030)
pH: 5 (ref 5.0–8.0)

## 2021-04-17 LAB — COMPREHENSIVE METABOLIC PANEL
ALT: 28 U/L (ref 0–44)
AST: 22 U/L (ref 15–41)
Albumin: 4.8 g/dL (ref 3.5–5.0)
Alkaline Phosphatase: 121 U/L (ref 38–126)
Anion gap: 10 (ref 5–15)
BUN: 20 mg/dL (ref 6–20)
CO2: 28 mmol/L (ref 22–32)
Calcium: 9.8 mg/dL (ref 8.9–10.3)
Chloride: 105 mmol/L (ref 98–111)
Creatinine, Ser: 0.9 mg/dL (ref 0.44–1.00)
GFR, Estimated: >60 mL/min (ref >60)
Glucose, Bld: 103 mg/dL — ABNORMAL HIGH (ref 70–99)
Potassium: 3.9 mmol/L (ref 3.5–5.1)
Sodium: 143 mmol/L (ref 135–145)
Total Bilirubin: 0.6 mg/dL (ref 0.3–1.2)
Total Protein: 8.1 g/dL (ref 6.5–8.1)

## 2021-04-17 LAB — CBC
HCT: 43.8 % (ref 36.0–46.0)
Hemoglobin: 14 g/dL (ref 12.0–15.0)
MCH: 28.1 pg (ref 26.0–34.0)
MCHC: 32 g/dL (ref 30.0–36.0)
MCV: 87.8 fL (ref 80.0–100.0)
Platelets: 381 10*3/uL (ref 150–400)
RBC: 4.99 MIL/uL (ref 3.87–5.11)
RDW: 14 % (ref 11.5–15.5)
WBC: 13.3 10*3/uL — ABNORMAL HIGH (ref 4.0–10.5)
nRBC: 0 % (ref 0.0–0.2)

## 2021-04-17 LAB — LIPASE, BLOOD: Lipase: 34 U/L (ref 11–51)

## 2021-04-17 NOTE — ED Triage Notes (Signed)
Left lower quadrant pain with constipation x 2 days ?

## 2021-04-17 NOTE — ED Provider Notes (Signed)
Patient left the emergency department prior to me seeing the patient.  I did not provide any care to this patient nor was I involved at any point in her care today. ?  ?Hendricks Limes, PA-C ?04/17/21 1540 ? ?  ?Godfrey Pick, MD ?04/20/21 1149 ? ?

## 2021-04-18 ENCOUNTER — Encounter: Payer: Self-pay | Admitting: Internal Medicine

## 2021-04-19 ENCOUNTER — Encounter: Payer: Self-pay | Admitting: Internal Medicine

## 2021-04-19 NOTE — Telephone Encounter (Signed)
Per Dr. Gala Romney: Glad to hear she went back to the emergency room and her condition has been diagnosed.  Please make her an appointment in the office to follow-up on recurrent diverticulitis in 3 to 4 weeks if not already scheduled  ?

## 2021-04-19 NOTE — Telephone Encounter (Signed)
See other pt advice request. Message to pt ?

## 2021-04-23 ENCOUNTER — Encounter: Payer: Self-pay | Admitting: Internal Medicine

## 2021-04-23 NOTE — Telephone Encounter (Signed)
See other mychart message.

## 2021-04-23 NOTE — Telephone Encounter (Signed)
Please advise 

## 2021-04-23 NOTE — Telephone Encounter (Signed)
FYI patient wants to be billed for copay tomorrow  ?

## 2021-04-24 ENCOUNTER — Encounter: Payer: Self-pay | Admitting: Internal Medicine

## 2021-04-24 ENCOUNTER — Ambulatory Visit (INDEPENDENT_AMBULATORY_CARE_PROVIDER_SITE_OTHER): Payer: 59 | Admitting: Internal Medicine

## 2021-04-24 ENCOUNTER — Other Ambulatory Visit: Payer: Self-pay

## 2021-04-24 VITALS — BP 120/64 | HR 87 | Temp 97.5°F | Ht 66.0 in | Wt 241.0 lb

## 2021-04-24 DIAGNOSIS — K5732 Diverticulitis of large intestine without perforation or abscess without bleeding: Secondary | ICD-10-CM

## 2021-04-24 DIAGNOSIS — K219 Gastro-esophageal reflux disease without esophagitis: Secondary | ICD-10-CM

## 2021-04-24 DIAGNOSIS — K5904 Chronic idiopathic constipation: Secondary | ICD-10-CM

## 2021-04-24 NOTE — Progress Notes (Signed)
? ? ?Primary Care Physician:  [facility] ?Primary Gastroenterologist:  Dr. Gala Romney ? ?Pre-Procedure History & Physical: ?HPI:  Kristen [name] is a 51 y.o. female here for follow-up of sigmoid diverticulitis.  Patient states she got sick last week with left lower quadrant abdominal pain; she went to [facility] and then  [facility] ER.  Ultimately,  noncontrast CT at [facility] demonstrated uncomplicated sigmoid diverticulitis - prescribed Cipro, Flagyl and dicyclomine.  Day 4 of therapy this AM.  Reports pain 8 out of 10.  No fever or chills.  No nausea or vomiting.  Patient reports this is the third episode ever.  She had 2 episodes last year.  Initial illness - sigmoid diverticulitis on CT.  Several weeks later recurrent abdominal pain - follow-up CT demonstrated diverticulosis but no evidence of inflammation.  She underwent a colonoscopy last year which revealed pancolonic diverticulosis.  Denies fever, nausea or vomiting. ?Went upwards of 1 week without any bowel movement over the past couple of months having run out of Linzess 2 months ago.  Has been taking various agent including Colace and MiraLAX with variable results.  Recently obtained Linzess and started taking 290 once daily.  States stools are stringy and loose. ? ?GERD continues to be well controlled on Protonix 40 mg daily. ? ?States she is under a lot of stress at home with her husband having seizures.  Being seen by multiple specialist at [facility].  Was recently airlifted from [place]. ? ?Past Medical History:  ?Diagnosis Date  ? Allergies   ? Anemia   ? Anxiety   ? Arthritis   ? Atypical mole 11/03/2006  ? mid upper back (slight to moderate)  ? Atypical mole 11/03/2006  ? lower right back (slight to moderate)  ? Back pain   ? Bipolar 1 disorder ([place])   ? Borderline diabetic   ? Chronic constipation   ? Constipation   ? Depression   ? Family history of adverse reaction to anesthesia   ? mother-- ponv  ? Fatty liver   ? GAD (generalized anxiety  disorder)   ? GERD (gastroesophageal reflux disease)   ? Hiatal hernia   ? High triglycerides   ? History of kidney stones   ? History of recurrent UTIs   ? History of sepsis 06/2018  ? History of stomach ulcers   ? History of suicidal ideation   ? Hyperlipidemia   ? Hypothyroidism   ? IDA (iron deficiency anemia)   ? Joint pain   ? Melanoma ([place]) 11/03/2006  ? left chest in situ (excision)  ? Orthostasis   ? Palpitations   ? PONV (postoperative nausea and vomiting)   ? Prediabetes   ? Rapid heart rate   ? Renal calculus, left   ? Seasonal allergies   ? Shortness of breath   ? Sleep apnea   ? Sleep disorder, unspecified   ? per excessive sleeping during the day  ? Snoring   ? Swallowing difficulty   ? Tachycardia   ? Vertigo   ? Vitamin D deficiency   ? Weakness   ? Wears contact lenses   ? ? ?Past Surgical History:  ?Procedure Laterality Date  ? ANTERIOR CERVICAL DECOMP/DISCECTOMY FUSION  02/17/2012  ? Procedure: ANTERIOR CERVICAL DECOMPRESSION/DISCECTOMY FUSION 2 LEVELS;  Surgeon: Hosie Spangle, MD;  Location: [place] [facility];  Service: Neurosurgery;  Laterality: N/A;  Cervical four-five and Cervical six-seven anterior cervial decompression with fusion plating and bonegraft  ? ANTERIOR CERVICAL  DECOMP/DISCECTOMY FUSION  04-01-2001   '@[facility]'$   ? C5---6  ? BIOPSY  07/20/2018  ? Procedure: BIOPSY;  Surgeon: Daneil Dolin, MD;  [facility];  Service: Endoscopy;;  gastric ?  ? BREAST REDUCTION SURGERY Bilateral 1995  ? CESAREAN SECTION  x2   last one 1998  ? with BILATERAL TUBAL LIGATION  ? CESAREAN SECTION WITH BILATERAL TUBAL LIGATION    ? COLONOSCOPY WITH PROPOFOL N/A 07/20/2018  ? Dr. Gala Romney: Diverticulosis, internal hemorrhoids, 5 mm splenic flexure tubular adenoma removed. Repeat in 5 years due to family history.  ? COLONOSCOPY WITH PROPOFOL N/A 12/07/2020  ? Procedure: COLONOSCOPY WITH PROPOFOL;  Surgeon: Daneil Dolin, MD;  [facility]: [facility];  Service: Endoscopy;  Laterality: N/A;  2:30pm  ?  CYSTOSCOPY/URETEROSCOPY/HOLMIUM LASER/STENT PLACEMENT Left 08/21/2018  ? Procedure: CYSTOSCOPY LEFT RETROGRADE PYELOGRAM /URETEROSCOPY/HOLMIUM LASER/STENT PLACEMENT;  Surgeon: Ceasar Mons, MD;  Location: [facility];  Service: Urology;  Laterality: Left;  ? DILATION AND CURETTAGE OF UTERUS  1992  ? for miscarriage  ? ENDOMETRIAL ABLATION  2014  ? ESOPHAGOGASTRODUODENOSCOPY    ? 20 years ago.   ? ESOPHAGOGASTRODUODENOSCOPY (EGD) WITH PROPOFOL N/A 07/20/2018  ? Dr. Gala Romney: Small hiatal hernia, erythematous mucosa in the stomach with a healing gastric ulcer measuring 1 cm, biopsies negative.  ? ESOPHAGOGASTRODUODENOSCOPY (EGD) WITH PROPOFOL N/A 10/05/2020  ? Procedure: ESOPHAGOGASTRODUODENOSCOPY (EGD) WITH PROPOFOL;  Surgeon: Daneil Dolin, MD;  [facility];  Service: Endoscopy;  Laterality: N/A;  ? MALONEY DILATION N/A 10/05/2020  ? Procedure: MALONEY DILATION;  Surgeon: Daneil Dolin, MD;  [facility];  Service: Endoscopy;  Laterality: N/A;  ? [place]  ? unilateral for infertility  ? NASAL SINUS SURGERY  2010  approx.  ? POLYPECTOMY  07/20/2018  ? Procedure: POLYPECTOMY;  Surgeon: Daneil Dolin, MD;  [facility];  Service: Endoscopy;;  ? POSTERIOR CERVICAL FUSION/FORAMINOTOMY N/A 08/18/2013  ? Procedure: CERVICAL FOUR TO CERVICAL SEVEN POSTERIOR CERVICAL FUSION/FORAMINOTOMY LEVEL 3;  Surgeon: Hosie Spangle, MD;  Location: [place] [facility];  Service: Neurosurgery;  Laterality: N/A;  C4-7 posterior cervical fusion with lateral mass fixation  ? RIGHT/LEFT HEART CATH AND CORONARY ANGIOGRAPHY N/A 12/23/2019  ? Procedure: RIGHT/LEFT HEART CATH AND CORONARY ANGIOGRAPHY;  Surgeon: Burnell Blanks, MD;  Location: [facility];  Service: Cardiovascular;  Laterality: N/A;  ? ? ?Prior to Admission medications   ?Medication Sig Start Date End Date Taking? Authorizing Provider  ?acetaminophen (TYLENOL) 500 MG tablet Take 1,000 mg by  mouth every 6 (six) hours as needed for moderate pain.   Yes [provider]  ?albuterol (VENTOLIN HFA) 108 (90 Base) MCG/ACT inhaler Inhale 2 puffs into the lungs every 6 (six) hours as needed for wheezing or shortness of breath. 10/31/20  Yes Kozlow, Donnamarie Poag, MD  ?aspirin EC 81 MG tablet Take 1 tablet (81 mg total) by mouth daily. Swallow whole. 12/17/19  Yes Freada Bergeron, MD  ?buPROPion (WELLBUTRIN XL) 150 MG 24 hr tablet Take 3 tablets (450 mg total) by mouth daily. 11/28/20  Yes [name] T, PA-C  ?cetirizine (ZYRTEC) 10 MG tablet Take 10 mg by mouth daily.   Yes [provider]  ?ciprofloxacin (CIPRO) 500 MG tablet Take 500 mg by mouth 2 (two) times daily. 04/19/21  Yes [provider]  ?clonazePAM (KLONOPIN) 1 MG tablet 1 p.o. 3 times daily as needed anxiety and may repeat 1/2 pill during the  middle of the day as needed anxiety.  No more than 3.5 mg/day. 03/21/21  Yes [name] T, PA-C  ?dicyclomine (BENTYL) 20 MG tablet Take 20 mg by mouth 4 (four) times daily as needed. 04/19/21  Yes [provider]  ?EVENING PRIMROSE OIL PO Take by mouth.   Yes [provider]  ?hydrocortisone (ANUSOL-HC) 2.5 % rectal cream Place 1 application rectally 2-4 times daily for rectal bleeding/burning related to hemorrhoids. ?Patient taking differently: Place 1 application. rectally 4 (four) times daily as needed for hemorrhoids. P\ 07/20/20  Yes Erenest Rasher, PA-C  ?hydrOXYzine (ATARAX) 25 MG tablet Take 1-2 tablets (25-50 mg total) by mouth every 6 (six) hours as needed. 03/29/21  Yes [name] T, PA-C  ?lamoTRIgine (LAMICTAL) 200 MG tablet TAKE 2.5 TABLETS BY MOUTH ONCE DAILY 04/05/21  Yes [name] T, PA-C  ?levothyroxine (SYNTHROID) 75 MCG tablet Take 75 mcg by mouth daily before breakfast.  05/27/18  Yes [provider]  ?Rolan Lipa 290 MCG CAPS capsule TAKE 1 CAPSULE BY MOUTH ONCE DAILY BEFORE BREAKFAST 04/16/21  Yes Erenest Rasher, PA-C  ?meclizine  (ANTIVERT) 25 MG tablet Take 25 mg by mouth 3 (three) times daily as needed for dizziness.   Yes [provider]  ?metroNIDAZOLE (FLAGYL) 500 MG tablet Take 500 mg by mouth 3 (three) times daily. 04/19/21  Yes

## 2021-04-24 NOTE — Patient Instructions (Signed)
It was good to see you again today! ? ?Literature on diverticulitis provided ? ?Stop dicyclomine ? ?Stop Colace, Dulcolax, MiraLAX and Linzess (for now) ? ?Use Tylenol as needed for discomfort ? ?Strict clear liquid diet for the next 3 days ? ?4 days from now, may begin a low residue diet as long as abdominal pain has diminished (provided information on diet) ? ?Once you advance your diet, resume Linzess 290 - 1 gelcap daily ? ?Complete the entire course of metronidazole and Cipro ? ?Office visit here in 2 weeks ? ?We will add fiber to your diet once you are completely over the diverticulitis in about 3 or 4 weeks. ? ?As discussed, there is no need for blood work, x-rays or referral to a Psychologist, sport and exercise at this time. ? ?Depending on your clinical progress plans could change in the future. ?

## 2021-04-26 ENCOUNTER — Encounter: Payer: Self-pay | Admitting: Internal Medicine

## 2021-04-26 NOTE — Telephone Encounter (Signed)
See other telephone note.  

## 2021-04-26 NOTE — Telephone Encounter (Signed)
Pt ate fat free orange sherbet outside of the liquid diet that she was instructed to do. ?

## 2021-04-26 NOTE — Telephone Encounter (Signed)
See other telephone message 

## 2021-05-02 ENCOUNTER — Other Ambulatory Visit: Payer: Self-pay

## 2021-05-02 ENCOUNTER — Ambulatory Visit (INDEPENDENT_AMBULATORY_CARE_PROVIDER_SITE_OTHER): Payer: 59 | Admitting: Physician Assistant

## 2021-05-02 ENCOUNTER — Encounter: Payer: Self-pay | Admitting: Physician Assistant

## 2021-05-02 DIAGNOSIS — R5383 Other fatigue: Secondary | ICD-10-CM

## 2021-05-02 DIAGNOSIS — E032 Hypothyroidism due to medicaments and other exogenous substances: Secondary | ICD-10-CM

## 2021-05-02 DIAGNOSIS — F32A Depression, unspecified: Secondary | ICD-10-CM

## 2021-05-02 DIAGNOSIS — E6609 Other obesity due to excess calories: Secondary | ICD-10-CM

## 2021-05-02 DIAGNOSIS — F319 Bipolar disorder, unspecified: Secondary | ICD-10-CM

## 2021-05-02 DIAGNOSIS — F411 Generalized anxiety disorder: Secondary | ICD-10-CM

## 2021-05-02 DIAGNOSIS — F99 Mental disorder, not otherwise specified: Secondary | ICD-10-CM

## 2021-05-02 DIAGNOSIS — G2581 Restless legs syndrome: Secondary | ICD-10-CM

## 2021-05-02 DIAGNOSIS — G4733 Obstructive sleep apnea (adult) (pediatric): Secondary | ICD-10-CM

## 2021-05-02 DIAGNOSIS — E559 Vitamin D deficiency, unspecified: Secondary | ICD-10-CM

## 2021-05-02 DIAGNOSIS — Z1589 Genetic susceptibility to other disease: Secondary | ICD-10-CM

## 2021-05-02 DIAGNOSIS — Z6379 Other stressful life events affecting family and household: Secondary | ICD-10-CM

## 2021-05-02 DIAGNOSIS — F5105 Insomnia due to other mental disorder: Secondary | ICD-10-CM

## 2021-05-02 DIAGNOSIS — Z79899 Other long term (current) drug therapy: Secondary | ICD-10-CM

## 2021-05-02 MED ORDER — CEREFOLIN NAC 6-90.314-2-600 MG PO TABS: 1.0000 | ORAL_TABLET | Freq: Every day | ORAL | 3 refills | Status: DC

## 2021-05-02 NOTE — Progress Notes (Signed)
[facility] ? ?Patient ID: Kristen [name],  ?MRN: [id number] ? ?PCP: [facility] ? ?Date of Evaluation: 05/02/2021 ?Time spent:40 minutes ? ?Chief Complaint:  ?Chief Complaint   ?Anxiety; Depression; Insomnia; Follow-up ?  ? ? ?HISTORY/CURRENT STATUS: ?HPI For routine med check. ? ?Darion was seen a couple of weeks ago and Gene site test specimen was obtained.  She is here today to go over those results. ? ?In the interim, she was diagnosed with diverticulitis.  She has just finished Cipro and metronidazole.  She is feeling better but not 100% yet. ? ?Things at home are still the same.  Her husband has had several more seizures.  He will be going on long-term disability next month.  She worries about whether he will still have insurance or not. ? ?She is still very anxious.  "I worry about everything.  It does not matter what it is I cannot get thoughts out of my head."  She is not having panic attacks often but does have a feeling of being overwhelmed almost all the time.  Klonopin is still very helpful. ? ?There has not been any change in the depression.  She still wants to sleep a lot, but does activities of daily living that absolutely have to be done.  Still feels very lonely because of the situation with her son and daughter-in-law living in [place].  Personal hygiene may be a little better, she is still eating a lot of sugar.  She is talking to a counselor which is helpful.  No suicidal or homicidal thoughts. ? ?Patient denies increased energy with decreased need for sleep, no increased talkativeness, no racing thoughts, no impulsivity or risky behaviors, no increased spending, no increased libido, no grandiosity, no increased irritability or anger, and no hallucinations. ? ?Denies dizziness, syncope, seizures, numbness, tingling, tremor, tics, unsteady gait, slurred speech, confusion. Denies muscle or joint pain, stiffness, or dystonia. ? ?Individual Medical History/ Review of  Systems: Changes? :Yes    diverticulitis a few weeks ago able to be treated as an outpatient. ? ?Past medications for mental health diagnoses include: ?Prozac, Paxil, Lexapro, Celexa, Zoloft, Viibryd, Effexor, Cymbalta, Risperdal, Latuda, Lamictal, VPA, Lithium-became toxic, Xanax, Wellbutrin, Abilify, Willette Al[place], Strattera, Kapvay, Saphris, Adderall, Vyvanse, Lunesta, [place],  [place], Seroquel, Geodon, Tegretal ? ?Allergies: Iohexol and Rosuvastatin ? ?Current Medications:  ?Current Outpatient Medications:  ?  acetaminophen (TYLENOL) 500 MG tablet, Take 1,000 mg by mouth every 6 (six) hours as needed for moderate pain., Disp: , Rfl:  ?  albuterol (VENTOLIN HFA) 108 (90 Base) MCG/ACT inhaler, Inhale 2 puffs into the lungs every 6 (six) hours as needed for wheezing or shortness of breath., Disp: 18 g, Rfl: 1 ?  aspirin EC 81 MG tablet, Take 1 tablet (81 mg total) by mouth daily. Swallow whole., Disp: 30 tablet, Rfl: 11 ?  buPROPion (WELLBUTRIN XL) 150 MG 24 hr tablet, Take 3 tablets (450 mg total) by mouth daily., Disp: 270 tablet, Rfl: 1 ?  cetirizine (ZYRTEC) 10 MG tablet, Take 10 mg by mouth daily., Disp: , Rfl:  ?  clonazePAM (KLONOPIN) 1 MG tablet, 1 p.o. 3 times daily as needed anxiety and may repeat 1/2 pill during the middle of the day as needed anxiety.  No more than 3.5 mg/day., Disp: 105 tablet, Rfl: 0 ?  dicyclomine (BENTYL) 20 MG tablet, Take 20 mg by mouth 4 (four) times daily as needed., Disp: , Rfl:  ?  EVENING PRIMROSE OIL PO, Take by mouth., Disp: ,  Rfl:  ?  hydrocortisone (ANUSOL-HC) 2.5 % rectal cream, Place 1 application rectally 2-4 times daily for rectal bleeding/burning related to hemorrhoids. (Patient taking differently: Place 1 application. rectally 4 (four) times daily as needed for hemorrhoids. P\), Disp: 30 g, Rfl: 1 ?  hydrOXYzine (ATARAX) 25 MG tablet, Take 1-2 tablets (25-50 mg total) by mouth every 6 (six) hours as needed., Disp: 180 tablet, Rfl: 3 ?  lamoTRIgine (LAMICTAL) 200 MG tablet,  TAKE 2.5 TABLETS BY MOUTH ONCE DAILY, Disp: 75 tablet, Rfl: 0 ?  levothyroxine (SYNTHROID) 75 MCG tablet, Take 75 mcg by mouth daily before breakfast. , Disp: , Rfl:  ?  LINZESS 290 MCG CAPS capsule, TAKE 1 CAPSULE BY MOUTH ONCE DAILY BEFORE BREAKFAST, Disp: 30 capsule, Rfl: 3 ?  meclizine (ANTIVERT) 25 MG tablet, Take 25 mg by mouth 3 (three) times daily as needed for dizziness., Disp: , Rfl:  ?  Methylfol-Algae-B12-Acetylcyst (CEREFOLIN NAC) 6-90.314-2-600 MG TABS, Take 1 tablet by mouth daily., Disp: 90 tablet, Rfl: 3 ?  montelukast (SINGULAIR) 10 MG tablet, Take 10 mg by mouth daily. , Disp: , Rfl:  ?  ondansetron (ZOFRAN-ODT) 4 MG disintegrating tablet, Take 4 mg by mouth every 8 (eight) hours as needed., Disp: , Rfl:  ?  Oxcarbazepine (TRILEPTAL) 300 MG tablet, Take 3 tablets (900 mg total) by mouth at bedtime., Disp: 270 tablet, Rfl: 3 ?  pantoprazole (PROTONIX) 40 MG tablet, Take 1 tablet (40 mg total) by mouth 2 (two) times daily with a meal., Disp: 180 tablet, Rfl: 3 ?  sertraline (ZOLOFT) 100 MG tablet, Take 3 tablets (300 mg total) by mouth at bedtime., Disp: 90 tablet, Rfl: 5 ?  chlorpheniramine (SB CHLORPHENIRAMINE) 4 MG tablet, Take 1 tablet (4 mg total) by mouth at bedtime. (Patient not taking: Reported on 04/24/2021), Disp: 14 tablet, Rfl: 0 ?  estradiol (ESTRACE) 1 MG tablet, Take 1 mg by mouth daily. (Patient not taking: Reported on 03/14/2021), Disp: , Rfl:  ?  ezetimibe (ZETIA) 10 MG tablet, Take 1 tablet (10 mg total) by mouth daily. Please make overdue appt with Dr. Johney Frame before anymore refills. Thank you 2nd attempt (Patient not taking: Reported on 04/24/2021), Disp: 15 tablet, Rfl: 0 ?  fenofibrate (TRICOR) 145 MG tablet, Take 1 tablet (145 mg total) by mouth daily. (Patient not taking: Reported on 03/14/2021), Disp: 30 tablet, Rfl: 11 ?  Fluticasone-Salmeterol,sensor, (AIRDUO DIGIHALER) 232-14 MCG/ACT AEPB, Inhale 1 puff into the lungs 2 (two) times daily. (Patient not taking: Reported on  03/14/2021), Disp: 1 each, Rfl: 2 ?  icosapent Ethyl (VASCEPA) 1 g capsule, Take 2 capsules (2 g total) by mouth 2 (two) times daily. (Patient not taking: Reported on 03/14/2021), Disp: 360 capsule, Rfl: 3 ?  levocetirizine (XYZAL) 5 MG tablet, Take 5 mg by mouth every evening. (Patient not taking: Reported on 03/14/2021), Disp: , Rfl:  ?  metFORMIN (GLUCOPHAGE) 500 MG tablet, Take 1 tablet (500 mg total) by mouth 2 (two) times daily with a meal. (Patient not taking: Reported on 03/14/2021), Disp: 60 tablet, Rfl: 0 ?  polyethylene glycol-electrolytes (TRILYTE) 420 g solution, Take 4,000 mLs by mouth as directed. (Patient not taking: Reported on 03/14/2021), Disp: 4000 mL, Rfl: 0 ?  rosuvastatin (CRESTOR) 5 MG tablet, Take 1 tablet (5 mg total) by mouth daily. (Patient not taking: Reported on 05/02/2021), Disp: 90 tablet, Rfl: 3 ?Medication Side Effects:  Sweating ? ?Family Medical/ Social History: Changes?  See HPI.  ? ?MENTAL HEALTH EXAM: ? ?There were no  vitals taken for this visit.There is no height or weight on file to calculate BMI.  ?General Appearance: Casual, Neat, Well Groomed and Obese  ?Eye Contact:  Fair  ?Speech:  Clear and Coherent and Normal Rate  ?Volume:  Normal  ?Mood:   Is sad but not as depressed as she was at the last visit on 03/29/2021.  ?Affect:  Congruent  ?Thought Process:  Goal Directed and Descriptions of Associations: Circumstantial  ?Orientation:  Full (Time, Place, and Person)  ?Thought Content: Logical   ?Suicidal Thoughts:  No  ?Homicidal Thoughts:  No  ?Memory:  WNL  ?Judgement:  Good  ?Insight:  Good  ?Psychomotor Activity:  Normal  ?Concentration:  Concentration: Good and Attention Span: Good  ?Recall:  Good  ?Fund of Knowledge: Good  ?Language: Good  ?Assets:  Desire for Improvement  ?ADL's:  Intact  ?Cognition: WNL  ?Prognosis:  Good  ? ?Lab results 04/19/2021 ?CBC WBC 12.4, otherwise normal ?CMP alkaline phosphatase 142 otherwise normal ?Lipase elevated at 60 ?Magnesium normal ? ?Gene site  test results are under labs dated 04/03/2021. ? ? ?DIAGNOSES:  ?  ICD-10-CM   ?1. Bipolar I disorder with depression ([place])  F31.9   ?  ?2. Generalized anxiety disorder  F41.1   ?  ?3. Fatigue due to depressi

## 2021-05-04 ENCOUNTER — Other Ambulatory Visit: Payer: Self-pay | Admitting: Physician Assistant

## 2021-05-11 ENCOUNTER — Encounter: Payer: Self-pay | Admitting: Internal Medicine

## 2021-05-11 ENCOUNTER — Ambulatory Visit: Payer: 59 | Admitting: Internal Medicine

## 2021-05-11 VITALS — BP 110/70 | HR 84 | Temp 97.3°F | Ht 66.0 in | Wt 239.8 lb

## 2021-05-11 DIAGNOSIS — K5732 Diverticulitis of large intestine without perforation or abscess without bleeding: Secondary | ICD-10-CM | POA: Diagnosis not present

## 2021-05-11 DIAGNOSIS — K219 Gastro-esophageal reflux disease without esophagitis: Secondary | ICD-10-CM | POA: Diagnosis not present

## 2021-05-11 DIAGNOSIS — K5900 Constipation, unspecified: Secondary | ICD-10-CM | POA: Diagnosis not present

## 2021-05-11 NOTE — Progress Notes (Signed)
? ? ?Primary Care Physician:  Doyle Askew, PA-C ?Primary Gastroenterologist:  Dr. Gala Romney ? ?Pre-Procedure History & Physical: ?HPI:  Kristen [name] is a 51 y.o. female here for diverticulitis or possibly diverticular associated colitis.  Much improved.  Some bloating and abdominal cramping.  He has not started fiber supplement as of yet.  Completed antibiotics.  GERD well-controlled on pantoprazole 40 mg daily. ?Daily BM with Linzess.  Does not take it on the day she goes to church. ?Takes metformin when her blood sugar is elevated.  She gets a headache and knows her blood sugars elevated then takes the metformin.  For instance, her blood sugar was 180 other day and she took a metformin.  Takes it "as needed" ? ?Colonoscopy 10/22 as outlined. ? ?Patient tells me that her psychiatrist did blood work recently and found her folate was low;  she is taking an over-the-counter folate supplement.  Celiac panel was negative previously.  She has researched low folate on the Internet and tells me she has probably been avoiding fully fortified foods. ? ?Serum folate was normal at 8.9 through hematology oncology a couple years ago. ?Past Medical History:  ?Diagnosis Date  ? Allergies   ? Anemia   ? Anxiety   ? Arthritis   ? Atypical mole 11/03/2006  ? mid upper back (slight to moderate)  ? Atypical mole 11/03/2006  ? lower right back (slight to moderate)  ? Back pain   ? Bipolar 1 disorder ([place])   ? Borderline diabetic   ? Chronic constipation   ? Constipation   ? Depression   ? Family history of adverse reaction to anesthesia   ? mother-- ponv  ? Fatty liver   ? GAD (generalized anxiety disorder)   ? GERD (gastroesophageal reflux disease)   ? Hiatal hernia   ? High triglycerides   ? History of kidney stones   ? History of recurrent UTIs   ? History of sepsis 06/2018  ? History of stomach ulcers   ? History of suicidal ideation   ? Hyperlipidemia   ? Hypothyroidism   ? IDA (iron deficiency anemia)   ? Joint pain   ?  Melanoma ([place]) 11/03/2006  ? left chest in situ (excision)  ? Orthostasis   ? Palpitations   ? PONV (postoperative nausea and vomiting)   ? Prediabetes   ? Rapid heart rate   ? Renal calculus, left   ? Seasonal allergies   ? Shortness of breath   ? Sleep apnea   ? Sleep disorder, unspecified   ? per excessive sleeping during the day  ? Snoring   ? Swallowing difficulty   ? Tachycardia   ? Vertigo   ? Vitamin D deficiency   ? Weakness   ? Wears contact lenses   ? ? ?Past Surgical History:  ?Procedure Laterality Date  ? ANTERIOR CERVICAL DECOMP/DISCECTOMY FUSION  02/17/2012  ? Procedure: ANTERIOR CERVICAL DECOMPRESSION/DISCECTOMY FUSION 2 LEVELS;  Surgeon: Hosie Spangle, MD;  Location: [facility];  Service: Neurosurgery;  Laterality: N/A;  Cervical four-five and Cervical six-seven anterior cervial decompression with fusion plating and bonegraft  ? ANTERIOR CERVICAL DECOMP/DISCECTOMY FUSION  04-01-2001   '@[facility]'$   ? C5---6  ? BIOPSY  07/20/2018  ? Procedure: BIOPSY;  Surgeon: Daneil Dolin, MD;  [facility];  Service: Endoscopy;;  gastric ?  ? BREAST REDUCTION SURGERY Bilateral 1995  ? CESAREAN SECTION  x2   last one 1998  ? with BILATERAL  TUBAL LIGATION  ? CESAREAN SECTION WITH BILATERAL TUBAL LIGATION    ? COLONOSCOPY WITH PROPOFOL N/A 07/20/2018  ? Dr. Gala Romney: Diverticulosis, internal hemorrhoids, 5 mm splenic flexure tubular adenoma removed. Repeat in 5 years due to family history.  ? COLONOSCOPY WITH PROPOFOL N/A 12/07/2020  ? Procedure: COLONOSCOPY WITH PROPOFOL;  Surgeon: Daneil Dolin, MD;  [facility];  Service: Endoscopy;  Laterality: N/A;  2:30pm  ? CYSTOSCOPY/URETEROSCOPY/HOLMIUM LASER/STENT PLACEMENT Left 08/21/2018  ? Procedure: CYSTOSCOPY LEFT RETROGRADE PYELOGRAM /URETEROSCOPY/HOLMIUM LASER/STENT PLACEMENT;  Surgeon: Ceasar Mons, MD;  [facility];  Service: Urology;  Laterality: Left;  ? DILATION AND CURETTAGE OF UTERUS  1992  ? for  miscarriage  ? ENDOMETRIAL ABLATION  2014  ? ESOPHAGOGASTRODUODENOSCOPY    ? 20 years ago.   ? ESOPHAGOGASTRODUODENOSCOPY (EGD) WITH PROPOFOL N/A 07/20/2018  ? Dr. Gala Romney: Small hiatal hernia, erythematous mucosa in the stomach with a healing gastric ulcer measuring 1 cm, biopsies negative.  ? ESOPHAGOGASTRODUODENOSCOPY (EGD) WITH PROPOFOL N/A 10/05/2020  ? Procedure: ESOPHAGOGASTRODUODENOSCOPY (EGD) WITH PROPOFOL;  Surgeon: Daneil Dolin, MD;  [facility];  Service: Endoscopy;  Laterality: N/A;  ? MALONEY DILATION N/A 10/05/2020  ? Procedure: MALONEY DILATION;  Surgeon: Daneil Dolin, MD;  [facility];  Service: Endoscopy;  Laterality: N/A;  ? [place]  ? unilateral for infertility  ? NASAL SINUS SURGERY  2010  approx.  ? POLYPECTOMY  07/20/2018  ? Procedure: POLYPECTOMY;  Surgeon: Daneil Dolin, MD;  [facility];  Service: Endoscopy;;  ? POSTERIOR CERVICAL FUSION/FORAMINOTOMY N/A 08/18/2013  ? Procedure: CERVICAL FOUR TO CERVICAL SEVEN POSTERIOR CERVICAL FUSION/FORAMINOTOMY LEVEL 3;  Surgeon: Hosie Spangle, MD;  Location: [place];  Service: Neurosurgery;  Laterality: N/A;  C4-7 posterior cervical fusion with lateral mass fixation  ? RIGHT/LEFT HEART CATH AND CORONARY ANGIOGRAPHY N/A 12/23/2019  ? Procedure: RIGHT/LEFT HEART CATH AND CORONARY ANGIOGRAPHY;  Surgeon: Burnell Blanks, MD;  Location: [facility];  Service: Cardiovascular;  Laterality: N/A;  ? ? ?Prior to Admission medications   ?Medication Sig Start Date End Date Taking? Authorizing Provider  ?acetaminophen (TYLENOL) 500 MG tablet Take 1,000 mg by mouth every 6 (six) hours as needed for moderate pain.   Yes [provider]  ?albuterol (VENTOLIN HFA) 108 (90 Base) MCG/ACT inhaler Inhale 2 puffs into the lungs every 6 (six) hours as needed for wheezing or shortness of breath. 10/31/20  Yes Kozlow, Donnamarie Poag, MD  ?aspirin EC 81 MG tablet Take 1 tablet (81 mg total) by mouth  daily. Swallow whole. 12/17/19  Yes Freada Bergeron, MD  ?buPROPion (WELLBUTRIN XL) 150 MG 24 hr tablet Take 3 tablets (450 mg total) by mouth daily. 11/28/20  Yes [name] T, PA-C  ?cetirizine (ZYRTEC) 10 MG tablet Take 10 mg by mouth daily.   Yes [provider]  ?chlorpheniramine (SB CHLORPHENIRAMINE) 4 MG tablet Take 1 tablet (4 mg total) by mouth at bedtime. 03/16/21  Yes Spero Geralds, MD  ?clonazePAM (KLONOPIN) 1 MG tablet 1 p.o. 3 times daily as needed anxiety and may repeat 1/2 pill during the middle of the day as needed anxiety.  No more than 3.5 mg/day. 03/21/21  Yes Hurst, Helene Kelp T, PA-C  ?EVENING PRIMROSE OIL PO Take by mouth.   Yes [provider]  ?hydrocortisone (ANUSOL-HC) 2.5 % rectal cream Place 1 application rectally 2-4 times daily for rectal bleeding/burning related to hemorrhoids. ?Patient  taking differently: Place 1 application. rectally 4 (four) times daily as needed for hemorrhoids. P\ 07/20/20  Yes Erenest Rasher, PA-C  ?hydrOXYzine (ATARAX) 25 MG tablet Take 1-2 tablets (25-50 mg total) by mouth every 6 (six) hours as needed. 03/29/21  Yes Hurst, Helene Kelp T, PA-C  ?lamoTRIgine (LAMICTAL) 200 MG tablet TAKE 2 & 1/2 (TWO & ONE-HALF) TABLETS BY MOUTH ONCE DAILY 05/04/21  Yes [name] T, PA-C  ?levothyroxine (SYNTHROID) 75 MCG tablet Take 75 mcg by mouth daily before breakfast.  05/27/18  Yes [provider]  ?Rolan Lipa 290 MCG CAPS capsule TAKE 1 CAPSULE BY MOUTH ONCE DAILY BEFORE BREAKFAST 04/16/21  Yes Erenest Rasher, PA-C  ?meclizine (ANTIVERT) 25 MG tablet Take 25 mg by mouth 3 (three) times daily as needed for dizziness.   Yes [provider]  ?metFORMIN (GLUCOPHAGE) 500 MG tablet Take 1 tablet (500 mg total) by mouth 2 (two) times daily with a meal. 11/24/19  Yes Laqueta Linden, MD  ?montelukast (SINGULAIR) 10 MG tablet Take 10 mg by mouth daily.  10/26/18  Yes [provider]  ?ondansetron (ZOFRAN-ODT) 4 MG disintegrating tablet  Take 4 mg by mouth every 8 (eight) hours as needed. 04/19/21  Yes [provider]  ?Oxcarbazepine (TRILEPTAL) 300 MG tablet Take 3 tablets (900 mg total) by mouth at bedtime. 03/14/21  Yes Hurst, Teres

## 2021-05-11 NOTE — Patient Instructions (Signed)
It was good to see you again today! ? ?Continue taking your prebiotic/probiotic supplement daily ? ?Continue pantoprazole 40 mg daily for acid reflux ? ?Continue taking Linzess 290 as directed ? ?Add Benefiber 1 tablespoon each day to your regimen ? ?Plan to see you back in the office in 6 months ? ?If you have any interim issues, please let us know. ? ?I would advise you to see your primary care provider regarding optimizing how you take metformin and maximize control your blood sugars. ?

## 2021-05-14 ENCOUNTER — Encounter: Payer: Self-pay | Admitting: Cardiology

## 2021-05-15 ENCOUNTER — Encounter: Payer: Self-pay | Admitting: Cardiology

## 2021-05-21 ENCOUNTER — Encounter: Payer: Self-pay | Admitting: Internal Medicine

## 2021-05-21 NOTE — Telephone Encounter (Signed)
CT report and labs are in patient's chart under care everywhere.  ?

## 2021-05-22 ENCOUNTER — Telehealth: Payer: Self-pay | Admitting: Internal Medicine

## 2021-05-22 NOTE — Telephone Encounter (Signed)
Ok, I will continue to watch the schedule for cancellations.  ?

## 2021-05-22 NOTE — Telephone Encounter (Signed)
I called patient to let her know the Dr Gala Romney reviewed her [facility] records and wanted her to be seen by LSL or AB next month and not 6 months. I told her that I was canceling the [facility] with [facility] for 6/7 and I was going to call her as soon as LSL or AB have a cancellation to get her on the schedule. She voiced her opinion to me about how ridiculous it was for her to be in such pain and can't be seen. I assured her several times that I would call when something became available and she said that she has to drive her husband to multiple appointments and whatever day I may have she would probably be driving her husband to an appointment. I told her she could keep her OV with [facility] if she wanted, but she got frustrated and said she would just go back to the ER.  ?

## 2021-05-22 NOTE — Telephone Encounter (Signed)
Kristen [name] has an opening on 3/17 at 3pm. Looks like. Dr. Gala Romney specified Kristen [name] or [name].  ?

## 2021-05-22 NOTE — Telephone Encounter (Signed)
Dr Gala Romney has an opening at 1030 on 05/29/2021 if she can see him. Please advise ?

## 2021-05-22 NOTE — Telephone Encounter (Signed)
Spoke with Magda Paganini. Patient can be seen on 4/19 at 8:30 am. This is technically a NP slot, but Magda Paganini gave the ok to schedule this patient there.  ?

## 2021-05-22 NOTE — Telephone Encounter (Signed)
Can this patient be put in your 1:30 urgent slot next Thursday? Dr. Gala Romney is wanting to get her in to be seen for an ER follow up for diverticulitis within the next week.  ?

## 2021-05-23 ENCOUNTER — Other Ambulatory Visit: Payer: Self-pay | Admitting: Physician Assistant

## 2021-05-23 NOTE — Telephone Encounter (Signed)
noted 

## 2021-05-23 NOTE — Telephone Encounter (Signed)
Noted. We need to be sure she sees the My Chart message and is aware of her appointment.  ?

## 2021-05-23 NOTE — Telephone Encounter (Signed)
Pt scheduled with LSL for 4/19 at 0830 and sent via [place].  ?

## 2021-05-23 NOTE — Telephone Encounter (Signed)
Patient is aware of her OV ?

## 2021-05-24 DIAGNOSIS — L02211 Cutaneous abscess of abdominal wall: Secondary | ICD-10-CM | POA: Insufficient documentation

## 2021-05-30 ENCOUNTER — Ambulatory Visit: Payer: 59 | Admitting: Gastroenterology

## 2021-05-30 ENCOUNTER — Encounter: Payer: Self-pay | Admitting: Gastroenterology

## 2021-05-30 ENCOUNTER — Telehealth: Payer: Self-pay | Admitting: *Deleted

## 2021-05-30 VITALS — BP 120/62 | HR 79 | Temp 97.7°F | Ht 66.0 in | Wt 238.2 lb

## 2021-05-30 DIAGNOSIS — K5752 Diverticulitis of both small and large intestine without perforation or abscess without bleeding: Secondary | ICD-10-CM | POA: Diagnosis not present

## 2021-05-30 DIAGNOSIS — K5909 Other constipation: Secondary | ICD-10-CM | POA: Diagnosis not present

## 2021-05-30 DIAGNOSIS — K760 Fatty (change of) liver, not elsewhere classified: Secondary | ICD-10-CM | POA: Diagnosis not present

## 2021-05-30 DIAGNOSIS — R110 Nausea: Secondary | ICD-10-CM | POA: Diagnosis not present

## 2021-05-30 DIAGNOSIS — G4733 Obstructive sleep apnea (adult) (pediatric): Secondary | ICD-10-CM

## 2021-05-30 MED ORDER — ONDANSETRON 4 MG PO TBDP: 4.0000 mg | ORAL_TABLET | Freq: Three times a day (TID) | ORAL | 5 refills | Status: DC | PRN

## 2021-05-30 NOTE — Patient Instructions (Addendum)
Once you complete Gut Health, start probiotic such as [place], of [employer]. If you try one and it does not seem to help with bowels or bloating then try a different one with a different bacteria strain in it.  ?Add a good fiber supplement such as Benefiber once daily per package labeling. This will be helpful for your diverticulosis.  ?Continue Zofran as needed for nausea. ?Continue pantoprazole 40 mg twice daily. ?I will be in touch with further recommendations regarding special CT scan of your small bowel. ?

## 2021-05-30 NOTE — Progress Notes (Signed)
? ? ? ?GI Office Note   ? ?Referring Provider: Doyle Askew, PA-C ?Primary Care Physician:  Doyle Askew, PA-C  ?Primary Gastroenterologist: Garfield Cornea, MD ? ? ?Chief Complaint  ? ?Chief Complaint  ?Patient presents with  ? Follow-up  ?  Bloated, dull aching in abdomen. Stays nauseous all the time.   ? ? ?History of Present Illness  ? ?Kristen [name] is a 51 y.o. female presenting today for somewhat acute office visit.  History of chronic constipation, abdominal bloating/gas, GERD, peptic ulcer disease, fatty liver, diverticulitis.  She was last seen in March for follow-up of diverticulitis (noncontrast CT done at [facility]).  She was doing much better at that time. Was having some bloating and abdominal cramping.  Had not started fiber yet but had completed her antibiotics.  She was having daily bowel movement with Linzess.  Last colonoscopy was in October 2022, pancolonic diverticulosis. October 12, 2020: C. difficile antigen positive, C. difficile toxin negative, reflex positive, GI pathogen panel negative. ? ?Patient seen in the ED April 8 for a pimple that appeared infected on the abdomen. She has history of MRSA in the past. She was started on Bactrim. She returned for ED visit 4/10 with persistent infection lower abdomen associated with fullness/pain. She had CT A/P without contrast showing subtle inflammatory changed adjacent to the diverticulum in the third portion of duodenum, concerning for mild acute duodenal diverticulitis. Inflammatory changes in left side of abd wall concerning for cellulitis. Doxycycline was added to regimen. Being followed by Dr. Rocco Serene, general surgery, for left lower quadrant abscess.  Scheduled to see wound care,  4/26th, but now doing much better. Size, redness better, finger tip depth. Using topical medication nd bandage. Was worried she would have to go to wound for 3 years weekly like her mother.  Then freaked out cause mama passed away with diverticulitis,  sepsis.  ? ?She notes pain moved from lower abdomen to upper abdomen left of midline. This is better. Stays nauseated for past one year. Stays on zofran, one in am and one at dinner. Avoids fast food. Tries to grill food. More [place]. Less red meat. Taking [employer] "gut health" and Detox for her liver. History of hepatic steatosis on CT. Patient still under a lot of stress with husbands illness. She can't leave him alone due to seizures. She continues to take Linzess for constipation. Requires 237mg dose but can't take some days depending on activities. Can't skip more than 1-2 days of Linzess or will have hard stools and strains. No heartburn. No melena, brbpr.  ?  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 She was prescribed Deplin or Cerafolin but she was unable to afford. Last folate level 8.9 3 years ago. Not sure what current levels are.  ? ?Labs dated May 21, 2021: Potassium 3.3, creatinine 1.00, glucose 196, total bilirubin 0.3, alkaline phosphatase 146 high, AST 17, ALT 30.  White blood cell count 11,200, hemoglobin 12.3, platelets 346,000, lipase 60. ? ?She has history of recurrent diverticulitis involving the left colon, CT documented 07/2020, 04/2021.  ? ?EGD and colonoscopy June 2020:Small hiatal hernia, erythematous mucosa in the stomach with a healing gastric ulcer.  Biopsies negative.  Scattered diverticula in the colon, nonbleeding internal hemorrhoids, 5 mm splenic flexure tubular adenoma removed.  5-year surveillance colonoscopy recommended given family history of colon cancer.  ? ?EGD August 2022: ?- Normal esophagus.  Dilated. ?- Normal stomach. ?- Normal duodenal bulb and second portion of the duodenum. ?- No specimens collected. ? ?Colonoscopy October 2022: ?- Diverticulosis in the entire examined colon. ?- The examination was otherwise normal on direct and retroflexion views. ?- No specimens  collected. ?- Surveillance exam in 7 years. ? ? ?Medications  ? ?Current Outpatient Medications  ?Medication Sig Dispense Refill  ? acetaminophen (TYLENOL) 500 MG tablet Take 1,000 mg by mouth every 6 (six) hours as needed for moderate pain.    ? albuterol (VENTOLIN HFA) 108 (90 Base) MCG/ACT inhaler Inhale 2 puffs into the lungs every 6 (six) hours as needed for wheezing or shortness of breath. 18 g 1  ? aspirin EC 81 MG tablet Take 1 tablet (81 mg total) by mouth daily. Swallow whole. 30 tablet 11  ? buPROPion (WELLBUTRIN XL) 150 MG 24 hr tablet Take 3 tablets by mouth once daily 270 tablet 0  ? cetirizine (ZYRTEC) 10 MG tablet Take 10 mg by mouth daily.    ? chlorpheniramine (SB CHLORPHENIRAMINE) 4 MG tablet Take 1 tablet (4 mg total) by mouth at bedtime. 14 tablet 0  ? clonazePAM (KLONOPIN) 1 MG tablet 1 p.o. 3 times daily as needed anxiety and may repeat 1/2 pill during the middle of the day as needed anxiety.  No more than 3.5 mg/day. 105 tablet 0  ? EVENING PRIMROSE OIL PO Take by mouth.    ? hydrocortisone (ANUSOL-HC) 2.5 % rectal cream Place 1 application rectally 2-4 times daily for rectal bleeding/burning related to hemorrhoids. (Patient taking differently: Place 1 application. rectally 4 (four) times daily as needed for hemorrhoids. P\) 30 g 1  ? hydrOXYzine (ATARAX) 25 MG tablet Take 1-2 tablets (25-50 mg total) by mouth every 6 (six) hours as needed. 180 tablet 3  ? lamoTRIgine (LAMICTAL) 200 MG tablet TAKE 2 & 1/2 (TWO & ONE-HALF) TABLETS BY MOUTH ONCE DAILY 75 tablet 1  ? levothyroxine (SYNTHROID) 75 MCG tablet Take 75 mcg by mouth daily before breakfast.     ? LINZESS 290 MCG CAPS capsule TAKE 1 CAPSULE BY MOUTH ONCE DAILY BEFORE BREAKFAST 30 capsule 3  ? meclizine (ANTIVERT) 25 MG tablet Take 25 mg by mouth 3 (three) times daily as needed for dizziness.    ? metFORMIN (GLUCOPHAGE) 500 MG tablet Take 1 tablet (500 mg total) by mouth 2 (two) times daily with a meal. 60 tablet 0  ? montelukast  (SINGULAIR) 10 MG tablet Take 10 mg by mouth daily.     ? ondansetron (ZOFRAN-ODT) 4 MG disintegrating tablet Take 4 mg by mouth every 8 (eight) hours as needed.    ? Oxcarbazepine (TRILEPTAL) 300 MG tablet Take 3 tablets (900 mg total) by mouth at bedtime. 270 tablet 3  ? pantoprazole (PROTONIX) 40 MG tablet Take 1 tablet (40 mg total) by mouth 2 (two) times daily with a meal. 180 tablet 3  ? rosuvastatin (CRESTOR) 5 MG tablet Take 1 tablet (5 mg total) by mouth daily. 90 tablet 3  ? sertraline (ZOLOFT) 100 MG tablet Take 3 tablets (300 mg total) by mouth at bedtime. 90 tablet 5  ? ?No current facility-administered medications for this visit.  ? ? ?Allergies  ? ?Allergies as of 05/30/2021 - Review Complete 05/30/2021  ?Allergen Reaction Noted  ? Iohexol Hives 11/03/2007  ? Rosuvastatin Other (See Comments) 02/09/2020  ?  ? ?Review of Systems  ? ?General: Negative for anorexia, weight loss, fever, chills, fatigue, weakness. ?ENT: Negative for hoarseness, difficulty swallowing ,  nasal congestion. ?CV: Negative for chest pain, angina, palpitations, dyspnea on exertion, peripheral edema.  ?Respiratory: Negative for dyspnea at rest, dyspnea on exertion, cough, sputum, wheezing.  ?GI: See history of present illness. ?GU:  Negative for dysuria, hematuria, urinary incontinence, urinary frequency, nocturnal urination.  ?Endo: Negative for unusual weight change.  ?   ?Physical Exam  ? ?BP 120/62 (BP Location: Left Arm, Patient Position: Sitting, Cuff Size: Large)   Pulse 79   Temp 97.7 ?F (36.5 ?C) (Temporal)   Ht '5\' 6"'$  (1.676 m)   Wt 238 lb 3.2 oz (108 kg)   SpO2 95%   BMI 38.45 kg/m?  ?  ?General: Well-nourished, well-developed in no acute distress.  ?Eyes: No icterus. ?Mouth: Oropharyngeal mucosa moist and pink , no lesions erythema or exudate. ?Lungs: Clear to auscultation bilaterally.  ?Heart: Regular rate and rhythm, no murmurs rubs or gallops.  ?Abdomen: Bowel sounds are normal, nontender, nondistended, no  hepatosplenomegaly or masses,  ?no abdominal bruits or hernia , no rebound or guarding.  ?Rectal: not performed  ?Extremities: No lower extremity edema. No clubbing or deformities. ?Neuro: Alert and oriented x 4   ?Skin: Warm a

## 2021-05-30 NOTE — Telephone Encounter (Signed)
Per dr Radford Pax ?We could try a nasal pillow mask with chin strap. ?

## 2021-05-30 NOTE — Telephone Encounter (Signed)
Order placed to [facility] for nasal pillow mask with chin strap.  ?

## 2021-06-07 ENCOUNTER — Encounter: Payer: Self-pay | Admitting: Gastroenterology

## 2021-06-10 ENCOUNTER — Telehealth: Payer: Self-pay | Admitting: Gastroenterology

## 2021-06-10 NOTE — Telephone Encounter (Signed)
Patient needs follow up ov in 3 months.  ?

## 2021-06-13 ENCOUNTER — Ambulatory Visit: Payer: 59 | Admitting: Physician Assistant

## 2021-06-18 ENCOUNTER — Other Ambulatory Visit: Payer: Self-pay | Admitting: Physician Assistant

## 2021-06-22 ENCOUNTER — Encounter: Payer: Self-pay | Admitting: Cardiology

## 2021-06-22 DIAGNOSIS — G4733 Obstructive sleep apnea (adult) (pediatric): Secondary | ICD-10-CM

## 2021-06-22 NOTE — Telephone Encounter (Signed)
Called pt advised of MD recommendations.  Pt reports canceled an OV with ENT Dr. Wilburn Cornelia office for May 2. 2023.  Will call back at some point to reschedule.  Pt will start a weight loss program on Monday.  Pt is willing to meet with Pharm D clinic for possible injectable weight loss medication. However, does not feel that insurance will approve medication.  Asked if medication indication will be for obesity or DM.  Advised pt pharmacist will go over this with her at [facility].  Advised to bring all insurance cards with her to [facility].  Scheduled to see Pharm D on Tues May 16 at 2 pm. All questions answered.  ?

## 2021-06-26 ENCOUNTER — Encounter: Payer: Self-pay | Admitting: Pharmacist

## 2021-06-26 ENCOUNTER — Ambulatory Visit (INDEPENDENT_AMBULATORY_CARE_PROVIDER_SITE_OTHER): Payer: 59 | Admitting: Pharmacist

## 2021-06-26 VITALS — BP 124/80 | Wt 237.0 lb

## 2021-06-26 DIAGNOSIS — Z6838 Body mass index (BMI) 38.0-38.9, adult: Secondary | ICD-10-CM | POA: Diagnosis not present

## 2021-06-26 NOTE — Patient Instructions (Addendum)
Set small goals you can achieve. Set a goal of walking 5-10 and then increase as able ?Do not buy sweets ?Try removing sugar from coffee ? ?GLP-1 Receptor Agonist Counseling Points ?This medication reduces your appetite and may make you feel fuller longer.  ?Stop eating when your body tells you that you are full. This will likely happen sooner than you are used to. ?Store your medication in the fridge until you are ready to use it. ?Inject your medication in the fatty tissue of your lower abdominal area (2 inches away from belly button) or upper outer thigh. Rotate injection sites. ?Each pen will last you about 1 month (the first month it will last a few weeks longer). Use a different needle with each weekly injection. ?Common side effects include: nausea, diarrhea/constipation, and heartburn, and are more likely to occur if you overeat. ? ? ?Tips for living a healthier life ? ? ? ? ?Building a Naval architect Diet ?Make most of your meal vegetables and fruits - ? of your plate. ?Aim for color and variety, and remember that potatoes don?t count as vegetables on the Healthy Eating Plate because of their negative impact on blood sugar. ? ?Go for whole grains - ? of your plate. ?Whole and intact grains--whole wheat, barley, wheat berries, quinoa, oats, brown rice, and foods made with them, such as whole wheat pasta--have a milder effect on blood sugar and insulin than white bread, white rice, and other refined grains. ? ?Protein power - ? of your plate. ?Fish, poultry, beans, and nuts are all healthy, versatile protein sources--they can be mixed into salads, and pair well with vegetables on a plate. Limit red meat, and avoid processed meats such as bacon and sausage. ? ?Healthy plant oils - in moderation. ?Choose healthy vegetable oils like olive, canola, soy, corn, sunflower, peanut, and others, and avoid partially hydrogenated oils, which contain unhealthy trans fats. Remember that low-fat does not mean  ?healthy.? ? ?Drink water, coffee, or tea. ?Skip sugary drinks, limit milk and dairy products to one to two servings per day, and limit juice to a small glass per day. ? ?Stay active. ?The red figure running across the [place] is a reminder that staying active is also important in weight control. ? ?The main message of the Healthy Eating Plate is to focus on diet quality: ? ?The type of carbohydrate in the diet is more important than the amount of carbohydrate in the diet, because some sources of carbohydrate--like vegetables (other than potatoes), fruits, whole grains, and beans--are healthier than others. ?The Healthy Eating Plate also advises consumers to avoid sugary beverages, a major source of calories--usually with little nutritional value--in the American diet. ?The Healthy Eating Plate encourages consumers to use healthy oils, and it does not set a maximum on the percentage of calories people should get each day from healthy sources of fat. In this way, the Healthy Eating Plate recommends the opposite of the low-fat message promoted for decades by the USDA. ? ?[email or web address].no ? ?SUGAR ? ?Sugar is a huge problem in the modern day diet. Sugar is a big contributor to heart disease, diabetes, high triglyceride levels, fatty liver disease and obesity. Sugar is hidden in almost all packaged foods/beverages. Added sugar is extra sugar that is added beyond what is naturally found and has no nutritional benefit for your body. The American Heart Association recommends limiting added sugars to no more than 25g for women and 36 grams for men per day. There  are many names for sugar including maltose, sucrose (names ending in "ose"), high fructose corn syrup, molasses, cane sugar, corn sweetener, raw sugar, syrup, honey or fruit juice concentrate.  ? ?One of the best ways to limit your added sugars is to stop drinking sweetened beverages such as  soda, sweet tea, and fruit juice. ? ?There is 65g of added sugars in one 20oz bottle of Coke! That is equal to 7.5 donuts.  ? ?Pay attention and read all nutrition facts labels. Below is an examples of a nutrition facts label. The #1 is showing you the total sugars where the # 2 is showing you the added sugars. This one serving has almost the max amount of added sugars per day! ? ? ? ? ?20 oz Soda ?65g Sugar = 7.5 Glazed Donuts ? ?16oz Energy  ?Drink ?54g Sugar = 6.5 Glazed Donuts ? ?Large Sweet  ?Tea ?38g Sugar = 4 Glazed Donuts ? ?20oz Sports  ?Drink ?34g Sugar = 3.5 Glazed Donuts ? ?8oz Chocolate Milk ?24g Sugar =2.5 Glazed Donuts ? ?8oz Orange  ?Juice ?21g Sugar = 2 Glazed Donuts ? ?1 Juice Box ?14g Sugar = 1.5 Glazed Donuts ? ?16oz Water= NO SUGAR!! ? ?EXERCISE ? ?Exercise is good. We?ve all heard that. In an ideal world, we would all have time and resources to get plenty of it. When you are active, your heart pumps more efficiently and you will feel better.  Multiple studies show that even walking regularly has benefits that include living a longer life. The American Heart Association recommends 150 minutes per week of exercise (30 minutes per day most days of the week). You can do this in any increment you wish. Nine or more 10-minute walks count. So does an hour-long exercise class. Break the time apart into what will work in your life. Some of the best things you can do include walking briskly, jogging, cycling or swimming laps. Not everyone is ready to ?exercise.? Sometimes we need to start with just getting active. Here are some easy ways to be more active throughout the day: ? Take the stairs instead of the elevator ? Go for a 10-15 minute walk during your lunch break (find a friend to make it more enjoyable) ? When shopping, park at the back of the parking lot ? If you take public transportation, get off one stop early and walk the extra distance ? Pace around while making phone calls ? ?Check with your  doctor if you aren?t sure what your limitations may be. Always remember to drink plenty of water when doing any type of exercise. Don?t feel like a failure if you?re not getting the 90-150 minutes per week. If you started by being a couch potato, then just a 10-minute walk each day is a huge improvement. Start with little victories and work your way up. ? ? ?HEALTHY EATING TIPS ? ?When looking to improve your eating habits, whether to lose weight, lower blood pressure or just be healthier, it helps to know what a serving size is.  ? ?Grains ?1 slice of bread, ? bagel, ? cup pasta or rice  Vegetables ?1 cup fresh or raw vegetables, ? cup cooked or canned ?Fruits ?1 piece of medium sized fruit, ? cup canned,   Meats/Proteins ?? cup dried       1 oz meat, 1 egg, ? cup cooked beans, nuts or seeds ? ?Dairy        Fats ?Individual yogurt container, 1 cup (8oz)  1 teaspoon margarine/butter or vegetable  ?milk or milk alternative, 1 slice of cheese          oil; 1 tablespoon mayonnaise or salad dressing                 ? ?Plan ahead: make a menu of the meals for a week then create a grocery list to go with that menu. Consider meals that easily stretch into a night of leftovers, such as stews or casseroles. Or consider making two of your favorite meal and put one in the freezer for another night. Try a night or two each week that is ?meatless? or ?no cook? such as salads. When you get home from the grocery store wash and prepare your vegetables and fruits. Then when you need them they are ready to go.  ? ?Tips for going to the grocery store: ? [place] store or generic brands ? Check the weekly ad from your store on-line or in their in-store flyer ? Look at the unit price on the shelf tag to compare/contrast the costs of different items ? Buy fruits/vegetables in season ? Carrots, bananas and apples are low-cost, naturally healthy items ? If meats or frozen vegetables are on sale, buy some extras and put in your freezer ? Limit  buying prepared or ?ready to eat? items, even if they are pre-made salads or fruit snacks ? Do not shop when you?re hungry ? Foods at eye level tend to be more expensive. Look on the high and low shelves for

## 2021-06-26 NOTE — Progress Notes (Signed)
Patient ID: Kristen [name]                 DOB: [birth year]                    MRN: [id number] ? ? ? ? ?HPI: ?Kristen [name] is a 51 y.o. female patient referred to pharmacy clinic by Dr. Radford Pax to initiate weight loss therapy with GLP1-RA. PMH is significant for obesity, bipolar disorder, depression, anxiety, GERD, fatty liver, snoring, OSA and hypertriglyceridemia. Most recent BMI 38.25. P ? ?Patient presents today to discuss GLP-1 therapy for weight loss. When I attempted PA for [facility] prior to visit, there was an issue with her insurance stating they couldn't find the patient. Patient states that her husband has been on long term disability and they called HR yesterday about it because [facility] was telling them they didn't have coverage. HR assured them they had coverage.  ? ?Patient reports that she stress eats. Sweets are her weakness. Reports she has been told she has a fatty liver. Blood sugars have been up. In the past 2 years both her mother and father have died and her husband has been out of work on long term disability due to seizures for the past year. She is working with Doran Clay (nutrition/fitness coach) who works with Dr. Elana Alm at [place]. She is providing her a meal plan. Patient has decided that she needs to do something about her health/weight. Has a new granddaughter (48 months old) who she wants to be able to run around with.  ? ?No personal or family history of MTC or MENS2. No hx of pancreatitis, gallstones or retinopathy. She has an A1C of 5.9 on Metformin currently.  ? ?Current weight management medications: none ? ?Previously tried meds: none ? ?Current meds that may affect weight: Oxcarbazepine, sertraline ? ?Baseline weight/BMI:237lb/38.25 ? ?Insurance payor: [place] ? ?Diet:  ?-Breakfast: special K w/ 2% milk, english muffin with natural PB, mini bagel brown sugar cinnamon spread ?-Lunch: left overs ?-Dinner: chicken parm w/ whole wheat pasta, tomato and basil sauce, chicken w/  green beans or broccoli, brown rice ?-Snacks: sweets ?-Drinks: water, 2 cups of coffee (stevia, flavored creamer) ? ?Exercise: plan is for 20 min 3 x a week ? ?Family History:  ?Family History  ?Problem Relation Age of Onset  ? Asthma Mother   ? Rheum arthritis Mother   ? Non-Hodgkin's lymphoma Mother   ? Congestive Heart Failure Mother   ? Atrial fibrillation Mother   ? Diabetes Mother   ? Hyperlipidemia Mother   ? Cancer Mother   ? Colon cancer Father   ? Multiple myeloma Father   ? Hypertension Father   ? Cancer Father   ? Depression Father   ? Anxiety disorder Father   ? Alcoholism Father   ? Obesity Father   ? Allergies Other   ?     entire family(mom and dad)  ? Diabetes Maternal Grandmother   ? Congestive Heart Failure Maternal Grandmother   ? Mental illness Paternal Grandmother   ? Colon cancer Paternal Grandfather   ? Bone cancer Paternal Grandfather   ? ? ? ?Social History: Former smoker, no ETOH ? ?Labs: ?Lab Results  ?Component Value Date  ? HGBA1C 5.5 10/25/2019  ? ? ?Wt Readings from Last 1 Encounters:  ?05/30/21 238 lb 3.2 oz (108 kg)  ? ? ?BP Readings from Last 1 Encounters:  ?05/30/21 120/62  ? ?Pulse Readings from Last 1 Encounters:  ?  05/30/21 79  ? ? ?   ?Component Value Date/Time  ? CHOL 181 05/12/2020 1117  ? TRIG 289 (H) 05/12/2020 1117  ? HDL 41 05/12/2020 1117  ? CHOLHDL 4.4 05/12/2020 1117  ? CHOLHDL 4.9 05/04/2008 0606  ? VLDL 24 05/04/2008 0606  ? [place] 92 05/12/2020 1117  ? ? ?Past Medical History:  ?Diagnosis Date  ? Allergies   ? Anemia   ? Anxiety   ? Arthritis   ? Atypical mole 11/03/2006  ? mid upper back (slight to moderate)  ? Atypical mole 11/03/2006  ? lower right back (slight to moderate)  ? Back pain   ? Bipolar 1 disorder ([place])   ? Borderline diabetic   ? Chronic constipation   ? Constipation   ? Depression   ? Family history of adverse reaction to anesthesia   ? mother-- ponv  ? Fatty liver   ? GAD (generalized anxiety disorder)   ? GERD (gastroesophageal reflux disease)    ? Hiatal hernia   ? High triglycerides   ? History of kidney stones   ? History of recurrent UTIs   ? History of sepsis 06/2018  ? History of stomach ulcers   ? History of suicidal ideation   ? Hyperlipidemia   ? Hypothyroidism   ? IDA (iron deficiency anemia)   ? Joint pain   ? Melanoma ([place]) 11/03/2006  ? left chest in situ (excision)  ? Orthostasis   ? Palpitations   ? PONV (postoperative nausea and vomiting)   ? Prediabetes   ? Rapid heart rate   ? Renal calculus, left   ? Seasonal allergies   ? Shortness of breath   ? Sleep apnea   ? Sleep disorder, unspecified   ? per excessive sleeping during the day  ? Snoring   ? Swallowing difficulty   ? Tachycardia   ? Vertigo   ? Vitamin D deficiency   ? Weakness   ? Wears contact lenses   ? ? ?Current Outpatient Medications on File Prior to Visit  ?Medication Sig Dispense Refill  ? acetaminophen (TYLENOL) 500 MG tablet Take 1,000 mg by mouth every 6 (six) hours as needed for moderate pain.    ? albuterol (VENTOLIN HFA) 108 (90 Base) MCG/ACT inhaler Inhale 2 puffs into the lungs every 6 (six) hours as needed for wheezing or shortness of breath. 18 g 1  ? aspirin EC 81 MG tablet Take 1 tablet (81 mg total) by mouth daily. Swallow whole. 30 tablet 11  ? buPROPion (WELLBUTRIN XL) 150 MG 24 hr tablet Take 3 tablets by mouth once daily 270 tablet 0  ? cetirizine (ZYRTEC) 10 MG tablet Take 10 mg by mouth daily.    ? chlorpheniramine (SB CHLORPHENIRAMINE) 4 MG tablet Take 1 tablet (4 mg total) by mouth at bedtime. 14 tablet 0  ? clonazePAM (KLONOPIN) 1 MG tablet 1 p.o. 3 times daily as needed anxiety and may repeat 1/2 pill during the middle of the day as needed anxiety.  No more than 3.5 mg/day. 105 tablet 0  ? EVENING PRIMROSE OIL PO Take by mouth.    ? hydrocortisone (ANUSOL-HC) 2.5 % rectal cream Place 1 application rectally 2-4 times daily for rectal bleeding/burning related to hemorrhoids. (Patient taking differently: Place 1 application. rectally 4 (four) times daily as  needed for hemorrhoids. P\) 30 g 1  ? hydrOXYzine (ATARAX) 25 MG tablet Take 1-2 tablets (25-50 mg total) by mouth every 6 (six) hours as needed. 180 tablet  3  ? lamoTRIgine (LAMICTAL) 200 MG tablet TAKE 2 & 1/2 (TWO & ONE-HALF) TABLETS BY MOUTH ONCE DAILY 75 tablet 1  ? levothyroxine (SYNTHROID) 75 MCG tablet Take 75 mcg by mouth daily before breakfast.     ? LINZESS 290 MCG CAPS capsule TAKE 1 CAPSULE BY MOUTH ONCE DAILY BEFORE BREAKFAST 30 capsule 3  ? meclizine (ANTIVERT) 25 MG tablet Take 25 mg by mouth 3 (three) times daily as needed for dizziness.    ? metFORMIN (GLUCOPHAGE) 500 MG tablet Take 1 tablet (500 mg total) by mouth 2 (two) times daily with a meal. 60 tablet 0  ? montelukast (SINGULAIR) 10 MG tablet Take 10 mg by mouth daily.     ? ondansetron (ZOFRAN-ODT) 4 MG disintegrating tablet Take 1 tablet (4 mg total) by mouth every 8 (eight) hours as needed. 60 tablet 5  ? Oxcarbazepine (TRILEPTAL) 300 MG tablet Take 3 tablets (900 mg total) by mouth at bedtime. 270 tablet 3  ? pantoprazole (PROTONIX) 40 MG tablet Take 1 tablet (40 mg total) by mouth 2 (two) times daily with a meal. 180 tablet 3  ? rosuvastatin (CRESTOR) 5 MG tablet Take 1 tablet (5 mg total) by mouth daily. 90 tablet 3  ? sertraline (ZOLOFT) 100 MG tablet TAKE 3 TABLETS BY MOUTH AT BEDTIME 90 tablet 0  ? ?No current facility-administered medications on file prior to visit.  ? ? ?Allergies  ?Allergen Reactions  ? Iohexol Hives  ?   Desc: IVP DYE ?Also developed hives when drinking this contrast responded to benadryl.   ? Rosuvastatin Other (See Comments)  ?  Muscle aches with 28m and 2638m tolerates 38m14maily without issues  ? ? ? ?Assessment/Plan: ? ?1. Weight loss - Patient has not met goal of at least 5% of body weight loss with comprehensive lifestyle modifications alone in the past 3-6 months. Pharmacotherapy is appropriate to pursue as augmentation. Will look into coverage for [employer] LLConfirmed patient not pregnant and no personal or  family history of medullary thyroid carcinoma (MTC) or Multiple Endocrine Neoplasia syndrome type 2 (MEN 2).  ? ?Advised patient on common side effects including nausea, diarrhea, dyspepsia, decreas

## 2021-06-27 MED ORDER — OZEMPIC (0.25 OR 0.5 MG/DOSE) 2 MG/3ML ~~LOC~~ SOPN
0.5000 mg | PEN_INJECTOR | SUBCUTANEOUS | 1 refills | Status: DC
Start: 2021-06-27 — End: 2021-08-15

## 2021-07-03 NOTE — Telephone Encounter (Signed)
Please arrange for CBC, lipase, LFTs in 6 weeks.   Patient needs ov with [facility] in 08/2021.

## 2021-07-04 ENCOUNTER — Other Ambulatory Visit: Payer: Self-pay

## 2021-07-04 DIAGNOSIS — K760 Fatty (change of) liver, not elsewhere classified: Secondary | ICD-10-CM

## 2021-07-04 DIAGNOSIS — K5732 Diverticulitis of large intestine without perforation or abscess without bleeding: Secondary | ICD-10-CM

## 2021-07-04 DIAGNOSIS — R7989 Other specified abnormal findings of blood chemistry: Secondary | ICD-10-CM

## 2021-07-04 NOTE — Telephone Encounter (Signed)
Labs were ordered. Routing to front for appt scheduling.

## 2021-07-05 ENCOUNTER — Telehealth (INDEPENDENT_AMBULATORY_CARE_PROVIDER_SITE_OTHER): Payer: 59 | Admitting: Physician Assistant

## 2021-07-05 ENCOUNTER — Encounter: Payer: Self-pay | Admitting: Physician Assistant

## 2021-07-05 DIAGNOSIS — G2581 Restless legs syndrome: Secondary | ICD-10-CM | POA: Diagnosis not present

## 2021-07-05 DIAGNOSIS — Z1589 Genetic susceptibility to other disease: Secondary | ICD-10-CM

## 2021-07-05 DIAGNOSIS — F319 Bipolar disorder, unspecified: Secondary | ICD-10-CM | POA: Diagnosis not present

## 2021-07-05 DIAGNOSIS — Z79899 Other long term (current) drug therapy: Secondary | ICD-10-CM

## 2021-07-05 DIAGNOSIS — E559 Vitamin D deficiency, unspecified: Secondary | ICD-10-CM

## 2021-07-05 DIAGNOSIS — F5105 Insomnia due to other mental disorder: Secondary | ICD-10-CM

## 2021-07-05 DIAGNOSIS — F411 Generalized anxiety disorder: Secondary | ICD-10-CM

## 2021-07-05 DIAGNOSIS — Z6379 Other stressful life events affecting family and household: Secondary | ICD-10-CM

## 2021-07-05 DIAGNOSIS — G4733 Obstructive sleep apnea (adult) (pediatric): Secondary | ICD-10-CM

## 2021-07-05 DIAGNOSIS — F99 Mental disorder, not otherwise specified: Secondary | ICD-10-CM

## 2021-07-05 MED ORDER — LAMOTRIGINE 200 MG PO TABS: ORAL_TABLET | ORAL | 3 refills | Status: DC

## 2021-07-05 MED ORDER — HYDROXYZINE HCL 25 MG PO TABS: 25.0000 mg | ORAL_TABLET | Freq: Four times a day (QID) | ORAL | 3 refills | Status: DC | PRN

## 2021-07-05 NOTE — Progress Notes (Signed)
[facility] Med Check  Patient ID: Kristen [name],  MRN: [id number]  PCP: Barrie Lyme  Date of Evaluation: 07/05/2021 Time spent:30 minutes  Chief Complaint:  Chief Complaint   Anxiety; Depression; Follow-up    Virtual Visit via Telehealth  I connected with patient by a video enabled telemedicine application with their informed consent, and verified patient privacy and that I am speaking with the correct person using two identifiers.  I am private, in my office and the patient is at home.  I discussed the limitations, risks, security and privacy concerns of performing an evaluation and management service by video and the availability of in person appointments. I also discussed with the patient that there may be a patient responsible charge related to this service. The patient expressed understanding and agreed to proceed.   I discussed the assessment and treatment plan with the patient. The patient was provided an opportunity to ask questions and all were answered. The patient agreed with the plan and demonstrated an understanding of the instructions.   The patient was advised to call back or seek an in-person evaluation if the symptoms worsen or if the condition fails to improve as anticipated.  I provided 30 minutes of non-face-to-face time during this encounter.   HISTORY/CURRENT STATUS: HPI For routine med check.  At least 50% better than she was at the last visit!  She has started taking methyl folate which has seemed to help a lot.  She was not able to afford the Cerefolin NAC and her primary provider recommended the L-methylfolate so that is what she is taking.  It is much more affordable.  Patient denies loss of interest in usual activities and is able to enjoy things.  Denies decreased energy.  Denies decreased motivation.  ADLs and personal hygiene are normal.  Appetite has not changed.  Weight is stable.  No extreme sadness, tearfulness, or feelings of  hopelessness.  Sleeps well most of the time.  Denies any changes in concentration, making decisions or remembering things.Anxiety is well controlled. No PA.  Denies suicidal or homicidal thoughts.  Patient denies increased energy with decreased need for sleep, no increased talkativeness, no racing thoughts, no impulsivity or risky behaviors, no increased spending, no increased libido, no grandiosity, no increased irritability or anger, no paranoia, and no hallucinations.  Denies dizziness, syncope, seizures, numbness, tingling, tremor, tics, unsteady gait, slurred speech, confusion. Denies muscle or joint pain, stiffness, or dystonia.  Individual Medical History/ Review of Systems: Changes? :Yes    has been sick with a stomach bug     Past medications for mental health diagnoses include: Prozac, Paxil, Lexapro, Celexa, Zoloft, Viibryd, Effexor, Cymbalta, Risperdal, Latuda, Lamictal, VPA, Lithium-became toxic, Xanax, Wellbutrin, Abilify, Traz, Strattera, Kapvay, Saphris, Adderall, Vyvanse, Lunesta, Sonata,  Vraylar, Seroquel, Geodon, Tegretal  Allergies: Iohexol and Rosuvastatin  Current Medications:  Current Outpatient Medications:    acetaminophen (TYLENOL) 500 MG tablet, Take 1,000 mg by mouth every 6 (six) hours as needed for moderate pain., Disp: , Rfl:    albuterol (VENTOLIN HFA) 108 (90 Base) MCG/ACT inhaler, Inhale 2 puffs into the lungs every 6 (six) hours as needed for wheezing or shortness of breath., Disp: 18 g, Rfl: 1   aspirin EC 81 MG tablet, Take 1 tablet (81 mg total) by mouth daily. Swallow whole., Disp: 30 tablet, Rfl: 11   buPROPion (WELLBUTRIN XL) 150 MG 24 hr tablet, Take 3 tablets by mouth once daily, Disp: 270 tablet, Rfl: 0   cetirizine (ZYRTEC)  10 MG tablet, Take 10 mg by mouth daily., Disp: , Rfl:    clonazePAM (KLONOPIN) 1 MG tablet, 1 p.o. 3 times daily as needed anxiety and may repeat 1/2 pill during the middle of the day as needed anxiety.  No more than 3.5 mg/day.,  Disp: 105 tablet, Rfl: 0   L-Methylfolate 15 MG TABS, Take 1 tablet by mouth daily., Disp: , Rfl:    levothyroxine (SYNTHROID) 75 MCG tablet, Take 75 mcg by mouth daily before breakfast. , Disp: , Rfl:    LINZESS 290 MCG CAPS capsule, TAKE 1 CAPSULE BY MOUTH ONCE DAILY BEFORE BREAKFAST, Disp: 30 capsule, Rfl: 3   meclizine (ANTIVERT) 25 MG tablet, Take 25 mg by mouth 3 (three) times daily as needed for dizziness., Disp: , Rfl:    metFORMIN (GLUCOPHAGE) 500 MG tablet, Take 1 tablet (500 mg total) by mouth 2 (two) times daily with a meal., Disp: 60 tablet, Rfl: 0   montelukast (SINGULAIR) 10 MG tablet, Take 10 mg by mouth daily. , Disp: , Rfl:    ondansetron (ZOFRAN-ODT) 4 MG disintegrating tablet, Take 1 tablet (4 mg total) by mouth every 8 (eight) hours as needed., Disp: 60 tablet, Rfl: 5   Oxcarbazepine (TRILEPTAL) 300 MG tablet, Take 3 tablets (900 mg total) by mouth at bedtime., Disp: 270 tablet, Rfl: 3   pantoprazole (PROTONIX) 40 MG tablet, Take 1 tablet (40 mg total) by mouth 2 (two) times daily with a meal., Disp: 180 tablet, Rfl: 3   rosuvastatin (CRESTOR) 5 MG tablet, Take 1 tablet (5 mg total) by mouth daily., Disp: 90 tablet, Rfl: 3   Semaglutide,0.25 or 0.5MG/DOS, (OZEMPIC, 0.25 OR 0.5 MG/DOSE,) 2 MG/3ML SOPN, Inject 0.5 mg into the skin once a week., Disp: 3 mL, Rfl: 1   sertraline (ZOLOFT) 100 MG tablet, TAKE 3 TABLETS BY MOUTH AT BEDTIME, Disp: 90 tablet, Rfl: 0   Wheat Dextrin (BENEFIBER DRINK MIX PO), Take 2 [place] by mouth daily., Disp: , Rfl:    EVENING PRIMROSE OIL PO, Take by mouth. (Patient not taking: Reported on 07/05/2021), Disp: , Rfl:    hydrocortisone (ANUSOL-HC) 2.5 % rectal cream, Place 1 application rectally 2-4 times daily for rectal bleeding/burning related to hemorrhoids. (Patient taking differently: Place 1 application. rectally 4 (four) times daily as needed for hemorrhoids. P\), Disp: 30 g, Rfl: 1   hydrOXYzine (ATARAX) 25 MG tablet, Take 1-2 tablets (25-50 mg  total) by mouth every 6 (six) hours as needed., Disp: 180 tablet, Rfl: 3   lamoTRIgine (LAMICTAL) 200 MG tablet, TAKE 2 & 1/2 (TWO & ONE-HALF) TABLETS BY MOUTH ONCE DAILY, Disp: 225 tablet, Rfl: 3 Medication Side Effects:  Sweating  Family Medical/ Social History: Changes? Husband has new job.   MENTAL HEALTH EXAM:  There were no vitals taken for this visit.There is no height or weight on file to calculate BMI.  General Appearance: Casual, Neat, Well Groomed and Obese  Eye Contact:  Fair  Speech:  Clear and Coherent and Normal Rate  Volume:  Normal  Mood:  Euthymic  Affect:  Congruent  Thought Process:  Goal Directed and Descriptions of Associations: Circumstantial  Orientation:  Full (Time, Place, and Person)  Thought Content: Logical   Suicidal Thoughts:  No  Homicidal Thoughts:  No  Memory:  WNL  Judgement:  Good  Insight:  Good  Psychomotor Activity:  Normal  Concentration:  Concentration: Good and Attention Span: Good  Recall:  Good  Fund of Knowledge: Good  Language: Good  Assets:  Desire for Improvement  ADL's:  Intact  Cognition: WNL  Prognosis:  Good   Lab  06/30/2021 CBC-WBC 13.2 CMP Alk phos 147 o/w nl.  Gene site test results are under labs dated 04/03/2021.   DIAGNOSES:    ICD-10-CM   1. Bipolar I disorder ([place])  F31.9     2. MTHFR gene mutation  Z15.89     3. Generalized anxiety disorder  F41.1     4. Restless legs  G25.81     5. Vitamin D deficiency  E55.9     6. Insomnia due to other mental disorder  F51.05    F99     7. Obstructive sleep apnea  G47.33     8. Stress due to illness of family member  Z63.79     63. Polypharmacy  Z79.899       Receiving Psychotherapy: Yes    Felix Ahmadi  RECOMMENDATIONS:  PDMP was reviewed.  Last Klonopin filled 06/14/2021. I provided 30  minutes of non-face-to-face time during this encounter, including time spent before and after the visit in records review, medical decision making, counseling pertinent to  today's visit, and charting.  I'm glad to see her doing better! No changes needed.   Continue Klonopin 1 mg 1 p.o. 3 times daily as needed, with occasional extra one half p.o. midday as needed. Continue hydroxyzine 25 mg, 1 p.o. 3 times daily as needed. Continue Wellbutrin XL 150 mg, 3 p.o. every morning. Continue Trileptal 300 mg, 3 nightly. Continue Lamictal 200 mg, 2.5 pills daily. Continue L-methylfolate 15 mg daily.Continue Zoloft 100 mg, 3 p.o. daily. Continue counseling with Felix Ahmadi.  Return in 2 months.  [name], PA-C

## 2021-07-16 ENCOUNTER — Other Ambulatory Visit: Payer: Self-pay

## 2021-07-16 DIAGNOSIS — R7989 Other specified abnormal findings of blood chemistry: Secondary | ICD-10-CM

## 2021-07-16 DIAGNOSIS — K760 Fatty (change of) liver, not elsewhere classified: Secondary | ICD-10-CM

## 2021-07-16 DIAGNOSIS — K5732 Diverticulitis of large intestine without perforation or abscess without bleeding: Secondary | ICD-10-CM

## 2021-07-17 ENCOUNTER — Encounter: Payer: Self-pay | Admitting: Pharmacist

## 2021-07-17 ENCOUNTER — Other Ambulatory Visit: Payer: Self-pay | Admitting: Pharmacist

## 2021-07-17 MED ORDER — ROSUVASTATIN CALCIUM 5 MG PO TABS: 5.0000 mg | ORAL_TABLET | Freq: Every day | ORAL | 3 refills | Status: DC

## 2021-07-18 ENCOUNTER — Ambulatory Visit: Payer: Self-pay | Admitting: Gastroenterology

## 2021-07-21 ENCOUNTER — Other Ambulatory Visit: Payer: Self-pay | Admitting: Physician Assistant

## 2021-07-24 NOTE — Telephone Encounter (Signed)
Pt called and checking the status of this refill 

## 2021-07-25 ENCOUNTER — Encounter: Payer: Self-pay | Admitting: Pharmacist

## 2021-07-26 ENCOUNTER — Encounter: Payer: Self-pay | Admitting: Internal Medicine

## 2021-08-06 ENCOUNTER — Encounter: Payer: Self-pay | Admitting: Pharmacist

## 2021-08-06 ENCOUNTER — Telehealth: Payer: Self-pay | Admitting: Physician Assistant

## 2021-08-06 ENCOUNTER — Telehealth: Payer: Self-pay | Admitting: Internal Medicine

## 2021-08-06 NOTE — Telephone Encounter (Signed)
Pt is aware of appt on 7/11/ and was instructed to keep that appointment as well as have previous labs for 08/15/21 completed. Pt is wanting to know if a folate panel can be added to the 08/15/21 labs. Pt states that a previous dr told her that her folate levels were abnormal and is curious if they are better now.

## 2021-08-06 NOTE — Telephone Encounter (Signed)
Noted  

## 2021-08-06 NOTE — Telephone Encounter (Signed)
Pt has OV on 7/11 and has multiple questions. She was seen in the ER over the weekend. Please call her [phone]

## 2021-08-06 NOTE — Telephone Encounter (Signed)
Pt was made aware and verbalized understanding.  

## 2021-08-06 NOTE — Telephone Encounter (Signed)
LVM to rtc 

## 2021-08-07 ENCOUNTER — Telehealth: Payer: Self-pay | Admitting: Physician Assistant

## 2021-08-08 ENCOUNTER — Encounter: Payer: Self-pay | Admitting: Pharmacist

## 2021-08-08 ENCOUNTER — Encounter: Payer: Self-pay | Admitting: Cardiology

## 2021-08-08 DIAGNOSIS — R051 Acute cough: Secondary | ICD-10-CM

## 2021-08-08 DIAGNOSIS — G4733 Obstructive sleep apnea (adult) (pediatric): Secondary | ICD-10-CM

## 2021-08-08 DIAGNOSIS — R0602 Shortness of breath: Secondary | ICD-10-CM

## 2021-08-08 DIAGNOSIS — G479 Sleep disorder, unspecified: Secondary | ICD-10-CM

## 2021-08-09 ENCOUNTER — Encounter: Payer: Self-pay | Admitting: Cardiology

## 2021-08-09 ENCOUNTER — Other Ambulatory Visit: Payer: Self-pay | Admitting: Physician Assistant

## 2021-08-09 ENCOUNTER — Telehealth: Payer: Self-pay | Admitting: Physician Assistant

## 2021-08-09 DIAGNOSIS — F32A Depression, unspecified: Secondary | ICD-10-CM

## 2021-08-09 DIAGNOSIS — G4733 Obstructive sleep apnea (adult) (pediatric): Secondary | ICD-10-CM

## 2021-08-09 DIAGNOSIS — G4700 Insomnia, unspecified: Secondary | ICD-10-CM

## 2021-08-09 DIAGNOSIS — Z1589 Genetic susceptibility to other disease: Secondary | ICD-10-CM

## 2021-08-09 DIAGNOSIS — Z79899 Other long term (current) drug therapy: Secondary | ICD-10-CM

## 2021-08-09 NOTE — Telephone Encounter (Signed)
It's not that her folic acid was abnormal, but she doesn't convert methyl folate to folate as well as she should.  The only way to know about folic acid is to get the test so that is fine, I will order it at [facility].  Please mail her the order.   Thanks

## 2021-08-09 NOTE — Telephone Encounter (Signed)
Order put in mail. Patient notified.

## 2021-08-09 NOTE — Telephone Encounter (Signed)
Mailed lab orders to [facility] [place] [id number]

## 2021-08-15 MED ORDER — SEMAGLUTIDE (1 MG/DOSE) 4 MG/3ML ~~LOC~~ SOPN: 1.0000 mg | PEN_INJECTOR | SUBCUTANEOUS | 1 refills | Status: DC

## 2021-08-16 ENCOUNTER — Ambulatory Visit: Payer: Medicare Other | Admitting: Physician Assistant

## 2021-08-16 LAB — CBC WITH DIFFERENTIAL/PLATELET
Absolute Monocytes: 819 cells/uL (ref 200–950)
Basophils Absolute: 83 cells/uL (ref 0–200)
Basophils Relative: 0.9 %
Eosinophils Absolute: 313 cells/uL (ref 15–500)
Eosinophils Relative: 3.4 %
HCT: 39.1 % (ref 35.0–45.0)
Hemoglobin: 12.9 g/dL (ref 11.7–15.5)
Lymphs Abs: 2180 cells/uL (ref 850–3900)
MCH: 28.5 pg (ref 27.0–33.0)
MCHC: 33 g/dL (ref 32.0–36.0)
MCV: 86.3 fL (ref 80.0–100.0)
MPV: 9.3 fL (ref 7.5–12.5)
Monocytes Relative: 8.9 %
Neutro Abs: 5805 cells/uL (ref 1500–7800)
Neutrophils Relative %: 63.1 %
Platelets: 375 10*3/uL (ref 140–400)
RBC: 4.53 10*6/uL (ref 3.80–5.10)
RDW: 14.6 % (ref 11.0–15.0)
Total Lymphocyte: 23.7 %
WBC: 9.2 10*3/uL (ref 3.8–10.8)

## 2021-08-16 LAB — HEPATIC FUNCTION PANEL
AG Ratio: 2.1 (calc) (ref 1.0–2.5)
ALT: 30 U/L — ABNORMAL HIGH (ref 6–29)
AST: 25 U/L (ref 10–35)
Albumin: 4.7 g/dL (ref 3.6–5.1)
Alkaline phosphatase (APISO): 125 U/L (ref 37–153)
Bilirubin, Direct: 0.1 mg/dL (ref 0.0–0.2)
Globulin: 2.2 g/dL (calc) (ref 1.9–3.7)
Indirect Bilirubin: 0.4 mg/dL (calc) (ref 0.2–1.2)
Total Bilirubin: 0.5 mg/dL (ref 0.2–1.2)
Total Protein: 6.9 g/dL (ref 6.1–8.1)

## 2021-08-16 LAB — B12 AND FOLATE PANEL
Folate: >24 ng/mL
Vitamin B-12: 518 pg/mL (ref 200–1100)

## 2021-08-16 LAB — LIPASE: Lipase: 26 U/L (ref 7–60)

## 2021-08-16 NOTE — Progress Notes (Signed)
Please let her know B12 and folate levels are normal.

## 2021-08-21 ENCOUNTER — Ambulatory Visit (INDEPENDENT_AMBULATORY_CARE_PROVIDER_SITE_OTHER): Payer: 59 | Admitting: Internal Medicine

## 2021-08-21 ENCOUNTER — Telehealth: Payer: Self-pay | Admitting: *Deleted

## 2021-08-21 ENCOUNTER — Encounter: Payer: Self-pay | Admitting: Internal Medicine

## 2021-08-21 VITALS — BP 130/78 | HR 84 | Temp 97.7°F | Ht 66.0 in | Wt 228.2 lb

## 2021-08-21 DIAGNOSIS — R7989 Other specified abnormal findings of blood chemistry: Secondary | ICD-10-CM

## 2021-08-21 DIAGNOSIS — R1013 Epigastric pain: Secondary | ICD-10-CM

## 2021-08-21 DIAGNOSIS — K5909 Other constipation: Secondary | ICD-10-CM

## 2021-08-21 DIAGNOSIS — K760 Fatty (change of) liver, not elsewhere classified: Secondary | ICD-10-CM

## 2021-08-21 NOTE — Progress Notes (Signed)
Primary Care Physician:  Barrie Lyme Primary Gastroenterologist:  Dr.   Pre-Procedure History & Physical: HPI:  Kristen [name] is a 51 y.o. female here for a 6-week history of intermittent postprandial epigastric pain occurs after lunch.  Denies diarrhea but feels her stools may be a little greasy from time to time.  No bleeding per rectum.  No melena.  Reflux well controlled on Protonix.  Seen in ED recently at [facility] ER.  Plain films suggest constipation.  Treated symptomatically.  Recent labs here look good except for ALT one-point above normal.  Serum lipase normal.  ALT minimally elevated previously.  No dysphagia since having her esophagus dilated previously.  States symptoms markedly different from diverticulitis which she has had previously.  Gallbladder ultrasound - February 2022; no cholelithiasis.  Past Medical History:  Diagnosis Date   Allergies    Anemia    Anxiety    Arthritis    Atypical mole 11/03/2006   mid upper back (slight to moderate)   Atypical mole 11/03/2006   lower right back (slight to moderate)   Back pain    Bipolar 1 disorder (HCC)    Borderline diabetic    Chronic constipation    Constipation    Depression    Family history of adverse reaction to anesthesia    mother-- ponv   Fatty liver    GAD (generalized anxiety disorder)    GERD (gastroesophageal reflux disease)    Hiatal hernia    High triglycerides    History of kidney stones    History of recurrent UTIs    History of sepsis 06/2018   History of stomach ulcers    History of suicidal ideation    Hyperlipidemia    Hypothyroidism    IDA (iron deficiency anemia)    Joint pain    Melanoma ([place]) 11/03/2006   left chest in situ (excision)   Orthostasis    Palpitations    PONV (postoperative nausea and vomiting)    Prediabetes    Rapid heart rate    Renal calculus, left    Seasonal allergies    Shortness of breath    Sleep apnea    Sleep disorder, unspecified    per  excessive sleeping during the day   Snoring    Swallowing difficulty    Tachycardia    Vertigo    Vitamin D deficiency    Weakness    Wears contact lenses     Past Surgical History:  Procedure Laterality Date   ANTERIOR CERVICAL DECOMP/DISCECTOMY FUSION  02/17/2012   Procedure: ANTERIOR CERVICAL DECOMPRESSION/DISCECTOMY FUSION 2 LEVELS;  Surgeon: Hosie Spangle, MD;  Location: [facility];  Service: Neurosurgery;  Laterality: N/A;  Cervical four-five and Cervical six-seven anterior cervial decompression with fusion plating and bonegraft   ANTERIOR CERVICAL DECOMP/DISCECTOMY FUSION  04-01-2001   '@[facility]'    C5---6   BIOPSY  07/20/2018   Procedure: BIOPSY;  Surgeon: Daneil Dolin, MD;  Location: [facility];  Service: Endoscopy;;  gastric    BREAST REDUCTION SURGERY Bilateral 1995   [place]  x2   last one 1998   with BILATERAL TUBAL LIGATION   CESAREAN SECTION WITH BILATERAL TUBAL LIGATION     COLONOSCOPY WITH PROPOFOL N/A 07/20/2018   Dr. Gala Romney: Diverticulosis, internal hemorrhoids, 5 mm splenic flexure tubular adenoma removed. Repeat in 5 years due to family history.   COLONOSCOPY WITH PROPOFOL N/A 12/07/2020   Procedure: COLONOSCOPY WITH PROPOFOL;  Surgeon: Gala Romney,  Cristopher Estimable, MD;  Location: [facility];  Service: Endoscopy;  Laterality: N/A;  2:30pm   CYSTOSCOPY/URETEROSCOPY/HOLMIUM LASER/STENT PLACEMENT Left 08/21/2018   Procedure: CYSTOSCOPY LEFT RETROGRADE PYELOGRAM /URETEROSCOPY/HOLMIUM LASER/STENT PLACEMENT;  Surgeon: Ceasar Mons, MD;  Location: [facility];  Service: Urology;  Laterality: Left;   DILATION AND CURETTAGE OF UTERUS  1992   for miscarriage   ENDOMETRIAL ABLATION  2014   ESOPHAGOGASTRODUODENOSCOPY     20 years ago.    ESOPHAGOGASTRODUODENOSCOPY (EGD) WITH PROPOFOL N/A 07/20/2018   Dr. Gala Romney: Small hiatal hernia, erythematous mucosa in the stomach with a healing gastric ulcer measuring 1 cm, biopsies negative.    ESOPHAGOGASTRODUODENOSCOPY (EGD) WITH PROPOFOL N/A 10/05/2020   Procedure: ESOPHAGOGASTRODUODENOSCOPY (EGD) WITH PROPOFOL;  Surgeon: Daneil Dolin, MD;  [facility];  Service: Endoscopy;  Laterality: N/A;   MALONEY DILATION N/A 10/05/2020   Procedure: Venia Minks DILATION;  Surgeon: Daneil Dolin, MD;  Location: [facility];  Service: Endoscopy;  Laterality: N/A;   [place]   unilateral for infertility   NASAL SINUS SURGERY  2010  approx.   POLYPECTOMY  07/20/2018   Procedure: POLYPECTOMY;  Surgeon: Daneil Dolin, MD;  [facility]: [facility];  Service: Endoscopy;;   POSTERIOR CERVICAL FUSION/FORAMINOTOMY N/A 08/18/2013   Procedure: CERVICAL FOUR TO CERVICAL SEVEN POSTERIOR CERVICAL FUSION/FORAMINOTOMY LEVEL 3;  Surgeon: Hosie Spangle, MD;  Location: [facility];  Service: Neurosurgery;  Laterality: N/A;  C4-7 posterior cervical fusion with lateral mass fixation   RIGHT/LEFT HEART CATH AND CORONARY ANGIOGRAPHY N/A 12/23/2019   Procedure: RIGHT/LEFT HEART CATH AND CORONARY ANGIOGRAPHY;  Surgeon: Burnell Blanks, MD;  Location: [facility];  Service: Cardiovascular;  Laterality: N/A;    Prior to Admission medications   Medication Sig Start Date End Date Taking? Authorizing Provider  acetaminophen (TYLENOL) 500 MG tablet Take 1,000 mg by mouth every 6 (six) hours as needed for moderate pain.   Yes [provider]  albuterol (VENTOLIN HFA) 108 (90 Base) MCG/ACT inhaler Inhale 2 puffs into the lungs every 6 (six) hours as needed for wheezing or shortness of breath. 10/31/20  Yes Kozlow, Donnamarie Poag, MD  aspirin EC 81 MG tablet Take 1 tablet (81 mg total) by mouth daily. Swallow whole. 12/17/19  Yes Freada Bergeron, MD  buPROPion (WELLBUTRIN XL) 150 MG 24 hr tablet Take 3 tablets by mouth once daily Patient taking differently: 450 mg daily. 05/24/21  Yes Hurst, Dorothea Glassman, PA-C  cetirizine (ZYRTEC) 10 MG tablet Take 10 mg by mouth daily.   Yes  [provider]  clonazePAM (KLONOPIN) 1 MG tablet 1 p.o. 3 times daily as needed anxiety and may repeat 1/2 pill during the middle of the day as needed anxiety.  No more than 3.5 mg/day. 03/21/21  Yes Hurst, Dorothea Glassman, PA-C  hydrocortisone (ANUSOL-HC) 2.5 % rectal cream Place 1 application rectally 2-4 times daily for rectal bleeding/burning related to hemorrhoids. Patient taking differently: Place 1 application  rectally 4 (four) times daily as needed for hemorrhoids. P\ 07/20/20  Yes Erenest Rasher, PA-C  hydrOXYzine (ATARAX) 25 MG tablet Take 1-2 tablets (25-50 mg total) by mouth every 6 (six) hours as needed. 07/05/21  Yes Hurst, Helene Kelp T, PA-C  lamoTRIgine (LAMICTAL) 200 MG tablet TAKE 2 & 1/2 (TWO & ONE-HALF) TABLETS BY MOUTH ONCE DAILY 07/05/21  Yes Addison Lank, PA-C  levothyroxine (SYNTHROID) 75 MCG tablet Take 75 mcg by mouth daily before breakfast.  05/27/18  Yes [provider]  LINZESS 290 MCG CAPS capsule TAKE 1 CAPSULE BY MOUTH ONCE DAILY BEFORE BREAKFAST 04/16/21  Yes Erenest Rasher, PA-C  meclizine (ANTIVERT) 25 MG tablet Take 25 mg by mouth 3 (three) times daily as needed for dizziness.   Yes [provider]  metFORMIN (GLUCOPHAGE-XR) 750 MG 24 hr tablet Take 750 mg by mouth daily. 08/11/21  Yes [provider]  montelukast (SINGULAIR) 10 MG tablet Take 10 mg by mouth daily.  10/26/18  Yes [provider]  ondansetron (ZOFRAN-ODT) 4 MG disintegrating tablet Take 1 tablet (4 mg total) by mouth every 8 (eight) hours as needed. 05/30/21  Yes Mahala Menghini, PA-C  Oxcarbazepine (TRILEPTAL) 300 MG tablet Take 3 tablets (900 mg total) by mouth at bedtime. 03/14/21  Yes [name] T, PA-C  pantoprazole (PROTONIX) 40 MG tablet Take 1 tablet (40 mg total) by mouth 2 (two) times daily with a meal. 07/11/20  Yes Annitta Needs, NP  rosuvastatin (CRESTOR) 5 MG tablet Take 1 tablet (5 mg total) by mouth daily. 07/17/21  Yes Freada Bergeron, MD   Semaglutide, 1 MG/DOSE, 4 MG/3ML SOPN Inject 1 mg into the skin once a week. 08/15/21  Yes Turner, Eber Hong, MD  sertraline (ZOLOFT) 100 MG tablet TAKE 3 TABLETS BY MOUTH AT BEDTIME 07/24/21  Yes Hurst, Teresa T, PA-C  Wheat Dextrin (BENEFIBER DRINK MIX PO) Take 2 Scoops by mouth daily.   Yes [provider]    Allergies as of 08/21/2021 - Review Complete 08/21/2021  Allergen Reaction Noted   Iohexol Hives 11/03/2007   Rosuvastatin Other (See Comments) 02/09/2020    Family History  Problem Relation Age of Onset   Asthma Mother    Rheum arthritis Mother    Non-Hodgkin's lymphoma Mother    Congestive Heart Failure Mother    Atrial fibrillation Mother    Diabetes Mother    Hyperlipidemia Mother    Cancer Mother    Colon cancer Father    Multiple myeloma Father    Hypertension Father    Cancer Father    Depression Father    Anxiety disorder Father    Alcoholism Father    Obesity Father    Allergies Other        entire family(mom and dad)   Diabetes Maternal Grandmother    Congestive Heart Failure Maternal Grandmother    Mental illness Paternal Grandmother    Colon cancer Paternal Grandfather    Bone cancer Paternal Grandfather     Social History   Socioeconomic History   Marital status: Married    Spouse name: Not on file   Number of children: 2   Years of education: Not on file   Highest education level: Not on file  Occupational History   Occupation: disability  Tobacco Use   Smoking status: Former    Packs/day: 1.00    Years: 5.00    Total pack years: 5.00    Types: Cigarettes    Quit date: 02/09/2002    Years since quitting: 19.5    Passive exposure: Past   Smokeless tobacco: Never  Vaping Use   Vaping Use: Never used  Substance and Sexual Activity   Alcohol use: No   Drug use: No   Sexual activity: Yes    Partners: Male    Birth control/protection: Surgical    Comment: spouse  Other Topics Concern   Not on file  Social History Narrative    Step brother-2  Step sister-2   Half sister -1 healthy   Social Determinants of Health   Financial Resource Strain: Low Risk  (07/10/2018)   Overall Financial Resource Strain (CARDIA)    Difficulty of Paying Living Expenses: Not very hard  Food Insecurity: No Food Insecurity (07/10/2018)   Hunger Vital Sign    Worried About Running Out of Food in the Last Year: Never true    [place] in the Last Year: Never true  Transportation Needs: No Transportation Needs (07/10/2018)   PRAPARE - Hydrologist (Medical): No    Lack of Transportation (Non-Medical): No  Physical Activity: Unknown (07/10/2018)   Exercise Vital Sign    Days of Exercise per Week: Patient refused    Minutes of Exercise per Session: Patient refused  Stress: Stress Concern Present (07/10/2018)   [place]    Feeling of Stress : Rather much  Social Connections: Unknown (07/10/2018)   Social Connection and Isolation Panel [NHANES]    Frequency of Communication with Friends and Family: Patient refused    Frequency of Social Gatherings with Friends and Family: Patient refused    Attends Religious Services: Patient refused    Active Member of Clubs or Organizations: Patient refused    Attends Archivist Meetings: Patient refused    Marital Status: Patient refused  Intimate Partner Violence: Not At Risk (07/10/2018)   Humiliation, Afraid, Rape, and Kick questionnaire    Fear of Current or Ex-Partner: No    Emotionally Abused: No    Physically Abused: No    Sexually Abused: No    Review of Systems: See HPI, otherwise negative ROS  Physical Exam: BP 130/78 (BP Location: Right Arm, Patient Position: Sitting, Cuff Size: Normal)   Pulse 84   Temp 97.7 F (36.5 C) (Oral)   Ht '5\' 6"'  (1.676 m)   Wt 228 lb 3.2 oz (103.5 kg)   LMP  (LMP Unknown)   SpO2 95%   BMI 36.83 kg/m  General:   Alert,  Well-developed,  well-nourished, pleasant and cooperative in NAD Neck:  Supple; no masses or thyromegaly. No significant cervical adenopathy. Lungs:  Clear throughout to auscultation.   No wheezes, crackles, or rhonchi. No acute distress. Heart:  Regular rate and rhythm; no murmurs, clicks, rubs,  or gallops. Abdomen: Non-distended, normal bowel sounds.  Soft and nontender without appreciable mass or hepatosplenomegaly.  Pulses:  Normal pulses noted. Extremities:  Without clubbing or edema.  Impression/Plan: 51 year old lady with GERD, history recurrent diverticulitis, fatty liver with bump in aminotransferases presents with yet another new GI symptom - that of intermittent postprandial abdominal pain which may last 2 to 3 hours predominately occurs after her   middle of the day meal.  Nothing reminiscent of diverticulitis symptoms.  GERD seems to be well controlled.  Pain mainly after lunch.  No issues with supper or breakfast.  No sitophobia.  Weight has been stable.  Ischemia or acid peptic disease appears less likely.  Symptoms not consistent with her well-known pain from diverticulitis.  Gallbladder in situ.  No obvious stones on prior ultrasound.  Could have biliary dyskinesia creeping into the picture.  Recommendations:  As discussed, we will get a gallbladder function test called a HIDA with CCK challenge to further evaluate your abdominal pain.  Continue your bowel regimen utilizing Linzess for constipation and Protonix to treat acid reflux.  Your recent labs look good.  Continue regular aerobic exercise  and strive for weight loss, maximum control of blood sugars.  Further recommendations to follow.  Office visit with [place] in 3 months        Notice: This dictation was prepared with Dragon dictation along with smaller phrase technology. Any transcriptional errors that result from this process are unintentional and may not be corrected upon review.

## 2021-08-21 NOTE — Telephone Encounter (Signed)
Per [facility] "Please be advised that your notification number for [phone] for [facility] is [phone]. This Notification number is valid for 45 calendar days, expiring on 10/05/2021 and is not a guarantee of payment. Payment is dependent upon the enrollee's benefit plan and the health care provider's contract. Please share these numbers with the member and the rendering provider if you are not providing the imaging service."

## 2021-08-21 NOTE — Patient Instructions (Signed)
It was good to see you again today!  As discussed, we will get a gallbladder function test called a HIDA with CCK challenge to further evaluate your after lunch abdominal pain.  Continue your bowel regimen utilizing Linzess for constipation and Protonix to treat acid reflux.  Your recent labs look good.  Continue regular aerobic exercise and strive for weight loss, maximum control of blood sugars.  Further recommendations to follow.  Office visit with [place] in 3 months

## 2021-08-23 ENCOUNTER — Ambulatory Visit (HOSPITAL_COMMUNITY)
Admission: RE | Admit: 2021-08-23 | Discharge: 2021-08-23 | Disposition: A | Discharge status: Home / Self Care | Payer: 59 | Source: Ambulatory Visit | Attending: Internal Medicine | Admitting: Internal Medicine

## 2021-08-23 ENCOUNTER — Other Ambulatory Visit: Payer: Self-pay | Admitting: Physician Assistant

## 2021-08-23 DIAGNOSIS — R1013 Epigastric pain: Secondary | ICD-10-CM | POA: Diagnosis not present

## 2021-08-23 MED ORDER — STERILE WATER FOR INJECTION IJ SOLN
INTRAMUSCULAR | Status: AC
  Administered 2021-08-23: 2.09 mL
  Filled 2021-08-23: qty 10

## 2021-08-23 MED ORDER — SINCALIDE 5 MCG IJ SOLR
INTRAMUSCULAR | Status: AC
  Administered 2021-08-23: 2.09 ug
  Filled 2021-08-23: qty 5

## 2021-08-23 MED ORDER — TECHNETIUM TC 99M MEBROFENIN IV KIT
5.0000 | PACK | Freq: Once | INTRAVENOUS | Status: AC | PRN
  Administered 2021-08-23: 5.4 via INTRAVENOUS

## 2021-08-27 ENCOUNTER — Encounter: Payer: Self-pay | Admitting: Internal Medicine

## 2021-08-27 NOTE — Progress Notes (Signed)
Sent mychart message

## 2021-09-05 ENCOUNTER — Encounter: Payer: Self-pay | Admitting: Internal Medicine

## 2021-09-05 ENCOUNTER — Ambulatory Visit: Payer: Medicare Other | Admitting: Physician Assistant

## 2021-09-05 ENCOUNTER — Encounter: Payer: Self-pay | Admitting: Pharmacist

## 2021-09-08 ENCOUNTER — Encounter: Payer: Self-pay | Admitting: Pharmacist

## 2021-09-08 ENCOUNTER — Encounter: Payer: Self-pay | Admitting: Internal Medicine

## 2021-09-10 ENCOUNTER — Encounter: Payer: Self-pay | Admitting: Cardiology

## 2021-09-10 DIAGNOSIS — G4733 Obstructive sleep apnea (adult) (pediatric): Secondary | ICD-10-CM

## 2021-09-14 ENCOUNTER — Encounter: Payer: Self-pay | Admitting: Pharmacist

## 2021-09-19 ENCOUNTER — Encounter: Payer: Self-pay | Admitting: Pharmacist

## 2021-09-19 ENCOUNTER — Encounter (INDEPENDENT_AMBULATORY_CARE_PROVIDER_SITE_OTHER): Payer: Self-pay

## 2021-09-27 ENCOUNTER — Encounter: Payer: Self-pay | Admitting: Pharmacist

## 2021-09-27 ENCOUNTER — Ambulatory Visit (INDEPENDENT_AMBULATORY_CARE_PROVIDER_SITE_OTHER): Payer: 59 | Admitting: Physician Assistant

## 2021-09-27 ENCOUNTER — Encounter: Payer: Self-pay | Admitting: Physician Assistant

## 2021-09-27 ENCOUNTER — Encounter: Payer: Self-pay | Admitting: Internal Medicine

## 2021-09-27 DIAGNOSIS — F411 Generalized anxiety disorder: Secondary | ICD-10-CM

## 2021-09-27 DIAGNOSIS — G2581 Restless legs syndrome: Secondary | ICD-10-CM

## 2021-09-27 DIAGNOSIS — Z1589 Genetic susceptibility to other disease: Secondary | ICD-10-CM | POA: Diagnosis not present

## 2021-09-27 DIAGNOSIS — F319 Bipolar disorder, unspecified: Secondary | ICD-10-CM | POA: Diagnosis not present

## 2021-09-27 DIAGNOSIS — Z79899 Other long term (current) drug therapy: Secondary | ICD-10-CM

## 2021-09-27 MED ORDER — CLONAZEPAM 1 MG PO TABS
ORAL_TABLET | ORAL | 5 refills | Status: DC
Start: 2021-09-27 — End: 2022-02-18

## 2021-09-27 NOTE — Progress Notes (Signed)
[facility] Med Check  Patient ID: Kristen [name],  MRN: [id number]  PCP: Barrie Lyme  Date of Evaluation: 09/27/2021 Time spent:30 minutes  Chief Complaint:  Chief Complaint   Anxiety; Depression; Follow-up     HISTORY/CURRENT STATUS: HPI For routine med check.  Has got a 'little part-time job.' Is happier than she's been in a very long time. She's an over-thinker and  worrier; being at home made her want to email and post things she 'should have been writing in her journal.' So working has given her a purpose. 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  Patient is able to enjoy things.  Energy and motivation are good. No extreme sadness, tearfulness, or feelings of hopelessness.  Sleeps well most of the time. ADLs and personal hygiene are normal.   Denies any changes in concentration, making decisions, or remembering things.  Appetite has not changed.  Weight is stable.  Anxiety is well controlled.  She has panic attacks if she does not take the Klonopin she does use it routinely.  Denies suicidal or homicidal thoughts.  Patient denies increased energy with decreased need for sleep, increased talkativeness, racing thoughts, impulsivity or risky behaviors, increased spending, increased libido, grandiosity, increased irritability or anger, paranoia, and no hallucinations.  Denies dizziness, syncope, seizures, numbness, tingling, tremor, tics, unsteady gait, slurred speech, confusion. Denies muscle or joint pain, stiffness, or dystonia.  Individual Medical History/ Review of Systems: Changes? :No      Past medications for mental health diagnoses include: Prozac, Paxil, Lexapro, Celexa, Zoloft, Viibryd, Effexor, Cymbalta, Risperdal, Latuda, Lamictal, VPA, Lithium-became toxic, Xanax, Wellbutrin, Abilify, Willette [place], Strattera, Kapvay, Saphris, Adderall, Vyvanse, Lunesta, Sonata,  Vraylar, Seroquel, Geodon, Tegretal  Allergies: Iohexol and Rosuvastatin  Current Medications:   Current Outpatient Medications:    acetaminophen (TYLENOL) 500 MG tablet, Take 1,000 mg by mouth every 6 (six) hours as needed for moderate pain., Disp: , Rfl:    albuterol (VENTOLIN HFA) 108 (90 Base) MCG/ACT inhaler, Inhale 2 puffs into the lungs every 6 (six) hours as needed for wheezing or shortness of breath., Disp: 18 g, Rfl: 1   aspirin EC 81 MG tablet, Take 1 tablet (81 mg total) by mouth daily. Swallow whole., Disp: 30 tablet, Rfl: 11   buPROPion (WELLBUTRIN XL) 150 MG 24 hr tablet, Take 3 tablets by mouth once daily, Disp: 270 tablet, Rfl: 0   hydrocortisone (ANUSOL-HC) 2.5 % rectal cream, Place 1 application rectally 2-4 times daily for rectal bleeding/burning related to hemorrhoids., Disp: 30 g, Rfl: 1   hydrOXYzine (ATARAX) 25 MG tablet, Take 1-2 tablets (25-50 mg total) by mouth every 6 (six) hours as needed., Disp: 180 tablet, Rfl: 3   lamoTRIgine (LAMICTAL) 200 MG tablet, TAKE 2 & 1/2 (TWO & ONE-HALF) TABLETS BY MOUTH ONCE DAILY, Disp: 225 tablet, Rfl: 3   levothyroxine (SYNTHROID) 75 MCG tablet, Take 75 mcg by mouth daily before breakfast. , Disp: , Rfl:    meclizine (ANTIVERT) 25 MG tablet, Take 25 mg by mouth 3 (three) times daily as needed for dizziness., Disp: , Rfl:    metFORMIN (GLUCOPHAGE-XR) 750 MG 24 hr tablet, Take 750 mg by mouth daily., Disp: , Rfl:    montelukast (SINGULAIR) 10 MG tablet, Take 10 mg by mouth daily. , Disp: , Rfl:    ondansetron (ZOFRAN-ODT) 4 MG disintegrating tablet, Take 1 tablet (4 mg total) by mouth every 8 (eight) hours as needed., Disp: 60 tablet, Rfl: 5   Oxcarbazepine (TRILEPTAL) 300 MG  tablet, Take 3 tablets (900 mg total) by mouth at bedtime., Disp: 270 tablet, Rfl: 3   pantoprazole (PROTONIX) 40 MG tablet, Take 1 tablet (40 mg total) by mouth 2 (two) times daily with a meal., Disp: 180 tablet, Rfl: 3   psyllium (METAMUCIL) 58.6 % packet, Take 1 packet by mouth daily., Disp: , Rfl:    rosuvastatin (CRESTOR) 5 MG tablet, Take 1 tablet (5 mg  total) by mouth daily., Disp: 90 tablet, Rfl: 3   Semaglutide, 1 MG/DOSE, 4 MG/3ML SOPN, Inject 1 mg into the skin once a week., Disp: 3 mL, Rfl:    sertraline (ZOLOFT) 100 MG tablet, TAKE 3 TABLETS BY MOUTH AT BEDTIME, Disp: 90 tablet, Rfl: 11   cetirizine (ZYRTEC) 10 MG tablet, Take 10 mg by mouth daily. (Patient not taking: Reported on 09/27/2021), Disp: , Rfl:    clonazePAM (KLONOPIN) 1 MG tablet, 1 p.o. 3 times daily as needed anxiety and may repeat 1/2 pill during the middle of the day as needed anxiety.  No more than 3.5 mg/day., Disp: 105 tablet, Rfl: 5   LINZESS 290 MCG CAPS capsule, TAKE 1 CAPSULE BY MOUTH ONCE DAILY BEFORE BREAKFAST (Patient not taking: Reported on 09/27/2021), Disp: 30 capsule, Rfl: 3   Wheat Dextrin (BENEFIBER DRINK MIX PO), Take 2 Scoops by mouth daily. (Patient not taking: Reported on 09/27/2021), Disp: , Rfl:  Medication Side Effects:  Sweating  Family Medical/ Social History: Changes? Husband still out of work on disability b/c seizures. See HPI.  MENTAL HEALTH EXAM:  There were no vitals taken for this visit.There is no height or weight on file to calculate BMI.  General Appearance: Casual, Neat, Well Groomed and Obese  Eye Contact:  Fair  Speech:  Clear and Coherent and Normal Rate  Volume:  Normal  Mood:  Euthymic  Affect:  Congruent  Thought Process:  Goal Directed and Descriptions of Associations: Circumstantial  Orientation:  Full (Time, Place, and Person)  Thought Content: Logical   Suicidal Thoughts:  No  Homicidal Thoughts:  No  Memory:  WNL  Judgement:  Good  Insight:  Good  Psychomotor Activity:  Normal  Concentration:  Concentration: Good and Attention Span: Good  Recall:  Good  Fund of Knowledge: Good  Language: Good  Assets:  Desire for Improvement  ADL's:  Intact  Cognition: WNL  Prognosis:  Good   Labs 08/05/2021  CBC with differential normal CMP normal except alkaline phosphatase 168  08/15/2021 CBC with differential normal B12  and folate levels normal LFTs normal Lipase normal  Gene site test results are under labs dated 04/03/2021.  DIAGNOSES:    ICD-10-CM   1. Bipolar I disorder ([place])  F31.9     2. Generalized anxiety disorder  F41.1     3. Restless legs  G25.81     4. MTHFR gene mutation  Z15.89     5. Polypharmacy  Z79.899        Receiving Psychotherapy: Yes    Felix Ahmadi  RECOMMENDATIONS:  PDMP was reviewed.  Last Klonopin filled 09/06/2021. I provided 30 minutes of face to face time during this encounter, including time spent before and after the visit in records review, medical decision making, counseling pertinent to today's visit, and charting.   I'm glad to see her doing so well so no change will be made.   Continue Wellbutrin XL 150 mg, 3 p.o. every morning. Continue Klonopin 1 mg 1 p.o. 3 times daily as needed, with occasional extra one  half p.o. midday as needed. Continue hydroxyzine 25 mg, 1 p.o. 3 times daily as needed. Continue Lamictal 200 mg, 2.5 pills daily. Continue Trileptal 300 mg, 3 nightly. Continue Zoloft 100 mg, 3 p.o. daily. Continue counseling with Felix Ahmadi.  Return in 3 months.  [name], PA-C

## 2021-10-01 ENCOUNTER — Other Ambulatory Visit: Payer: Self-pay | Admitting: Cardiology

## 2021-10-15 ENCOUNTER — Encounter: Payer: Self-pay | Admitting: Internal Medicine

## 2021-10-15 ENCOUNTER — Telehealth: Payer: Medicare Other | Admitting: Nurse Practitioner

## 2021-10-15 DIAGNOSIS — A084 Viral intestinal infection, unspecified: Secondary | ICD-10-CM | POA: Diagnosis not present

## 2021-10-15 MED ORDER — LOPERAMIDE HCL 2 MG PO TABS: 2.0000 mg | ORAL_TABLET | Freq: Four times a day (QID) | ORAL | 0 refills | Status: DC | PRN

## 2021-10-15 NOTE — Progress Notes (Signed)
We treat diarrhea with imodium. Any abdominal pain would need to be evaluated by your Gastroenterologist or PCP or urgent care if severe for face to face visit. It also appears that there is a note from GI stating your spasms in your stomach could be related to your ozempic. I will send the imodium.   Based on what you have shared with me it looks like you have Acute Infectious Diarrhea.  Most cases of acute diarrhea are due to infections with virus and bacteria and are self-limited conditions lasting less than 14 days.  For your symptoms you may take Imodium 2 mg tablets that are over the counter at your local pharmacy. Take two tablet now and then one after each loose stool up to 6 a day.  Antibiotics are not needed for most people with diarrhea.   HOME CARE We recommend changing your diet to help with your symptoms for the next few days. Drink plenty of fluids that contain water salt and sugar. Sports drinks such as Gatorade may help.  You may try broths, soups, bananas, applesauce, soft breads, mashed potatoes or crackers.  You are considered infectious for as long as the diarrhea continues. Hand washing or use of alcohol based hand sanitizers is recommend. It is best to stay out of work or school until your symptoms stop.   GET HELP RIGHT AWAY If you have dark yellow colored urine or do not pass urine frequently you should drink more fluids.   If your symptoms worsen  If you feel like you are going to pass out (faint) You have a new problem  MAKE SURE YOU  Understand these instructions. Will watch your condition. Will get help right away if you are not doing well or get worse.  Thank you for choosing an e-visit.  Your e-visit answers were reviewed by a board certified advanced clinical practitioner to complete your personal care plan. Depending upon the condition, your plan could have included both over the counter or prescription medications.  Please review your pharmacy choice.  Make sure the pharmacy is open so you can pick up prescription now. If there is a problem, you may contact your provider through [employer] and have the prescription routed to another pharmacy.  Your safety is important to [place]. If you have drug allergies check your prescription carefully.   For the next 24 hours you can use MyChart to ask questions about today's visit, request a non-urgent call back, or ask for a work or school excuse. You will get an email in the next two days asking about your experience. I hope that your e-visit has been valuable and will speed your recovery.

## 2021-10-15 NOTE — Progress Notes (Signed)
I have spent 5 minutes in review of e-visit questionnaire, review and updating patient chart, medical decision making and response to patient.  ° °Jeda Pardue W Mayeli Bornhorst, NP ° °  °

## 2021-10-22 ENCOUNTER — Ambulatory Visit: Payer: 59 | Admitting: Gastroenterology

## 2021-10-29 ENCOUNTER — Other Ambulatory Visit: Payer: Self-pay | Admitting: Gastroenterology

## 2021-10-30 ENCOUNTER — Encounter: Payer: Self-pay | Admitting: Pharmacist

## 2021-10-31 ENCOUNTER — Encounter: Payer: Self-pay | Admitting: Pharmacist

## 2021-10-31 MED ORDER — OZEMPIC (2 MG/DOSE) 8 MG/3ML ~~LOC~~ SOPN: 2.0000 mg | PEN_INJECTOR | SUBCUTANEOUS | 11 refills | Status: DC

## 2021-11-08 NOTE — Progress Notes (Signed)
GI Office Note    Referring Provider: Barrie Lyme Primary Care Physician:  Barrie Lyme  Primary Gastroenterologist: Garfield Cornea, MD   Chief Complaint   Chief Complaint  Patient presents with   Follow-up    Still having left lower abdominal pain, states there is a "knot" on her upper right side.     History of Present Illness   Kristen [name] is a 51 y.o. female presenting today for follow up. She has h/o GERD, diverticulitis (both duodenal and colonic), fatty liver, nausea, chronic constipation, RUQ bulging. Patient last seen in office 08/2021.  Last colonoscopy was in 11/2020, pancolonic diverticulosis. EGD 09/2020, normal. Esophagus dilated due to history of dysphagia.   Since her last visit she has lost weight on ozempic. She is down nearly 30 pounds since May. She has also been working PT at [employer]. She is moving more and loves her job. She continues to have LLQ pain constantly. Sometimes more severe than others. Never goes completely away. Still feels like the RUQ bulges more than the left. She has had this complaint over a year. She reported it during her 07/2020 visit.   Over the past four weeks she has been having increase nausea. She is having to take Zofran every day, often up to three times. Always takes a dose at bedtime. Tries not to eat after 4-5pm.  No vomiting. Having to eat mostly toast or smoothies. Avoiding eating late, pizza, other tomato based foods, spicy foods. No longer drinking coffee. She denies typical heartburn on pantoprazole BID. She however feels pressure or indigestion. Peppermint gels did not help. Uses TUMS. Wakes up every morning with hiccups. Lasting for just a couple of minutes. She has had 3 episodes of nocturnal regurgitation of acid. She sleeps on a pillow wedge. She is no longer on CPAP. She is not sure why she is having so much trouble with her GERD.   She has been on ozempic for several months. Started out on  0.'5mg'$  and then went up to '1mg'$ . She has been on current dose for about two months. Goes up to '2mg'$  soon.   BMs are doing well. Having 3-4 loose stools per week. Taking senokot two each night. No melena, brbpr. Drinking a lot of water. Trulance not affordable.Linzess 290 stopped working  CT A/P without contrast 06/2021: mild hepatic steatosis.   CT A/P without contrast 05/2021:  1. Subtle inflammatory changes adjacent to a diverticulum in the  third portion of the duodenum, concerning for mild acute duodenal  diverticulitis. No diverticular abscess or signs of frank  perforation are noted at this time.  2. Colonic diverticulosis without evidence to suggest an acute  colonic diverticulitis at this time.  3. Inflammatory changes in the left side of the abdominal wall  concerning for potential cellulitis, as above. Correlation with  physical examination is recommended.  4. Severe hepatic steatosis.  5. 2 mm nonobstructive calculus in the upper pole collecting system  of the right kidney. No ureteral stones or findings of urinary tract  obstruction at this time.  6. Additional incidental findings, as above.      Medications   Current Outpatient Medications  Medication Sig Dispense Refill   acetaminophen (TYLENOL) 500 MG tablet Take 1,000 mg by mouth every 6 (six) hours as needed for moderate pain.     albuterol (VENTOLIN HFA) 108 (90 Base) MCG/ACT inhaler Inhale 2 puffs into the lungs every 6 (six) hours as  needed for wheezing or shortness of breath. 18 g 1   aspirin EC 81 MG tablet Take 1 tablet (81 mg total) by mouth daily. Swallow whole. 30 tablet 11   buPROPion (WELLBUTRIN XL) 150 MG 24 hr tablet Take 3 tablets by mouth once daily 270 tablet 0   cetirizine (ZYRTEC) 10 MG tablet Take 10 mg by mouth daily.     clonazePAM (KLONOPIN) 1 MG tablet 1 p.o. 3 times daily as needed anxiety and may repeat 1/2 pill during the middle of the day as needed anxiety.  No more than 3.5 mg/day. 105 tablet 5    hydrocortisone (ANUSOL-HC) 2.5 % rectal cream Place 1 application rectally 2-4 times daily for rectal bleeding/burning related to hemorrhoids. 30 g 1   hydrOXYzine (ATARAX) 25 MG tablet Take 1-2 tablets (25-50 mg total) by mouth every 6 (six) hours as needed. 180 tablet 3   lamoTRIgine (LAMICTAL) 200 MG tablet TAKE 2 & 1/2 (TWO & ONE-HALF) TABLETS BY MOUTH ONCE DAILY 225 tablet 3   levothyroxine (SYNTHROID) 75 MCG tablet Take 75 mcg by mouth daily before breakfast.      loperamide (IMODIUM A-D) 2 MG tablet Take 1 tablet (2 mg total) by mouth 4 (four) times daily as needed for diarrhea or loose stools. 40 tablet 0   meclizine (ANTIVERT) 25 MG tablet Take 25 mg by mouth 3 (three) times daily as needed for dizziness.     metFORMIN (GLUCOPHAGE-XR) 750 MG 24 hr tablet Take 750 mg by mouth daily.     montelukast (SINGULAIR) 10 MG tablet Take 10 mg by mouth daily.      ondansetron (ZOFRAN-ODT) 4 MG disintegrating tablet Take 1 tablet (4 mg total) by mouth every 8 (eight) hours as needed. 60 tablet 5   Oxcarbazepine (TRILEPTAL) 300 MG tablet Take 3 tablets (900 mg total) by mouth at bedtime. 270 tablet 3   pantoprazole (PROTONIX) 40 MG tablet TAKE 1 TABLET BY MOUTH TWICE DAILY WITH A MEAL 180 tablet 2   psyllium (METAMUCIL) 58.6 % packet Take 1 packet by mouth daily.     rosuvastatin (CRESTOR) 5 MG tablet Take 1 tablet (5 mg total) by mouth daily. 90 tablet 3   Semaglutide, 2 MG/DOSE, (OZEMPIC, 2 MG/DOSE,) 8 MG/3ML SOPN Inject 2 mg into the skin once a week. 3 mL 11   sertraline (ZOLOFT) 100 MG tablet TAKE 3 TABLETS BY MOUTH AT BEDTIME 90 tablet 11   No current facility-administered medications for this visit.    Allergies   Allergies as of 11/09/2021 - Review Complete 11/09/2021  Allergen Reaction Noted   Iohexol Hives 11/03/2007   Rosuvastatin Other (See Comments) 02/09/2020   Hypaque meglumine [diatrizoate]  02/16/2021        Review of Systems   General: Negative for anorexia,  unintentional weight loss, fever, chills, fatigue, weakness. ENT: Negative for hoarseness, difficulty swallowing, nasal congestion. CV: Negative for chest pain, angina, palpitations, dyspnea on exertion, peripheral edema.  Respiratory: Negative for dyspnea at rest, dyspnea on exertion, cough, sputum, wheezing.  GI: See history of present illness. GU:  Negative for dysuria, hematuria, urinary incontinence, urinary frequency, nocturnal urination.  Endo: Negative for unusual weight change.     Physical Exam   BP 123/81 (BP Location: Right Arm, Patient Position: Sitting, Cuff Size: Normal)   Pulse 78   Temp 97.7 F (36.5 C) (Temporal)   Ht '5\' 6"'$  (1.676 m)   Wt 208 lb 9.6 oz (94.6 kg)   LMP  (LMP Unknown)  SpO2 96%   BMI 33.67 kg/m    General: Well-nourished, well-developed in no acute distress.  Eyes: No icterus. Mouth: Oropharyngeal mucosa moist and pink , no lesions erythema or exudate. Abdomen: Bowel sounds are normal, nontender, nondistended, no hepatosplenomegaly or masses, no abdominal bruits or hernia , no rebound or guarding. Right upper abdomen protrudes more than left, most notable with sitting/standing Rectal:not performed Extremities: No lower extremity edema. No clubbing or deformities. Neuro: Alert and oriented x 4   Skin: Warm and dry, no jaundice.   Psych: Alert and cooperative, normal mood and affect.  Labs   Lab Results  Component Value Date   LIPASE 26 08/15/2021   Lab Results  Component Value Date   CREATININE 0.90 04/17/2021   BUN 20 04/17/2021   NA 143 04/17/2021   K 3.9 04/17/2021   CL 105 04/17/2021   CO2 28 04/17/2021   Lab Results  Component Value Date   ALT 30 (H) 08/15/2021   AST 25 08/15/2021   ALKPHOS 121 04/17/2021   BILITOT 0.5 08/15/2021   Lab Results  Component Value Date   OMAYOKHT97 741 08/15/2021   Lab Results  Component Value Date   FOLATE >24.0 08/15/2021    Imaging Studies   No results found.  Assessment   GERD:  typical heartburn controlled but with some increased indigestion. Likely worsened in setting of ozempic and delay gastric emptying. She is getting ready to increase ozempic to maximum dose. She is to pay attention to her symptoms and notify if they escalate. For now continue PPI BID and TUMS prn.   Nausea: chronic in nature. Recently has worsened. Likely in setting of delayed gastric emptying due to ozempic. She has had success with weight loss and wants to continue medication. Increase supportive measures, avoid food triggers.   LLQ pain: Persistent, constant. Worse about 3-5 days per week. Doubt ongoing diverticulitis. Continue to monitor for now. Add low dose bentyl for increased pain.   Chronic constipation: controlled at this time.  RUQ bulging: Present for more than one year. She is noted to have abdominal wall asymmetry with right abdomen more prominent than the left but no palpable mass or hernia noted. She has had several reassuring CTs without explanation. I suspect benign finding.    PLAN   Increase zofran odt to '8mg'$  but take only twice daily. For left lower abdominal pain, try adding bentyl once to twice daily on days she has increased pain. If worsening constipation, she will need to hold. Continue antireflux measures.  Continue pantoprazole bid. TUMS prn. Send mychart message in few weeks. Return ov in 2-3 months.    Laureen Ochs. Bobby Rumpf, [place], [facility]

## 2021-11-09 ENCOUNTER — Ambulatory Visit (INDEPENDENT_AMBULATORY_CARE_PROVIDER_SITE_OTHER): Payer: Medicare Other | Admitting: Gastroenterology

## 2021-11-09 ENCOUNTER — Encounter: Payer: Self-pay | Admitting: Gastroenterology

## 2021-11-09 VITALS — BP 123/81 | HR 78 | Temp 97.7°F | Ht 66.0 in | Wt 208.6 lb

## 2021-11-09 DIAGNOSIS — R1032 Left lower quadrant pain: Secondary | ICD-10-CM

## 2021-11-09 DIAGNOSIS — R110 Nausea: Secondary | ICD-10-CM | POA: Diagnosis not present

## 2021-11-09 DIAGNOSIS — K219 Gastro-esophageal reflux disease without esophagitis: Secondary | ICD-10-CM

## 2021-11-09 DIAGNOSIS — R109 Unspecified abdominal pain: Secondary | ICD-10-CM | POA: Insufficient documentation

## 2021-11-09 DIAGNOSIS — K5909 Other constipation: Secondary | ICD-10-CM | POA: Diagnosis not present

## 2021-11-09 MED ORDER — ONDANSETRON 8 MG PO TBDP: 8.0000 mg | ORAL_TABLET | Freq: Two times a day (BID) | ORAL | 5 refills | Status: AC | PRN

## 2021-11-09 NOTE — Patient Instructions (Signed)
For nausea, we will increase your zofran to '8mg'$  twice daily. New RX sent to your pharmacy. For left lower abdominal pain, try out adding bentyl one daily on days you have the pain. If no significant improvement in pain and no worsening constipation, you can go up to twice daily on days you have the pain.  Please send me a mychart message in the next couple of weeks if you feel like your symptoms are not improving.  Continue to avoid reflux trigger foods and eating late at night.  Return office visit in 2-3 months.

## 2021-11-18 DIAGNOSIS — R1900 Intra-abdominal and pelvic swelling, mass and lump, unspecified site: Secondary | ICD-10-CM

## 2021-11-19 ENCOUNTER — Other Ambulatory Visit: Payer: Self-pay | Admitting: Physician Assistant

## 2021-11-19 NOTE — Telephone Encounter (Signed)
Patient reached out on mychart messages requesting appointment with general surgery regarding right sided abdominal bulge. She has chronic asymmetry of her abdomin, with right sided bulging more than left. She has had unremarkable CTs and abdominal exam without mass or hernia. I don't know that general surgery will have anything to offer but please arrange for referral per patient request Kristen [name]).

## 2021-11-19 NOTE — Telephone Encounter (Signed)
Referral sent 

## 2021-11-19 NOTE — Addendum Note (Signed)
Addended by: Cheron Every on: 11/19/2021 01:35 PM   Modules accepted: Orders

## 2021-11-20 ENCOUNTER — Encounter: Payer: Self-pay | Admitting: *Deleted

## 2021-11-20 DIAGNOSIS — I10 Essential (primary) hypertension: Secondary | ICD-10-CM | POA: Insufficient documentation

## 2021-11-27 ENCOUNTER — Encounter: Payer: Self-pay | Admitting: Pharmacist

## 2021-11-28 MED ORDER — SEMAGLUTIDE (1 MG/DOSE) 4 MG/3ML ~~LOC~~ SOPN: 1.0000 mg | PEN_INJECTOR | SUBCUTANEOUS | 11 refills | Status: DC

## 2021-12-04 ENCOUNTER — Encounter: Payer: Self-pay | Admitting: General Surgery

## 2021-12-04 ENCOUNTER — Ambulatory Visit (INDEPENDENT_AMBULATORY_CARE_PROVIDER_SITE_OTHER): Payer: 59 | Admitting: General Surgery

## 2021-12-04 VITALS — BP 116/78 | HR 76 | Temp 98.4°F | Resp 12 | Ht 66.0 in | Wt 202.0 lb

## 2021-12-04 DIAGNOSIS — R1033 Periumbilical pain: Secondary | ICD-10-CM

## 2021-12-04 NOTE — Progress Notes (Signed)
[facility] History and Physical  Reason for Referral:*** Referring Physician: ***  Chief Complaint   New Patient (Initial Visit)     Kristen [name] is a 51 y.o. female.  HPI: ***.  The *** started *** and has had a duration of ***.  It is associated with ***.  The *** is improved with ***, and is made worse with ***.    Quality*** Context***  Past Medical History:  Diagnosis Date  . Allergies   . Anemia   . Anxiety   . Arthritis   . Atypical mole 11/03/2006   mid upper back (slight to moderate)  . Atypical mole 11/03/2006   lower right back (slight to moderate)  . Back pain   . Bipolar 1 disorder ([place])   . Borderline diabetic   . Chronic constipation   . Constipation   . Depression   . Family history of adverse reaction to anesthesia    mother-- ponv  . Fatty liver   . GAD (generalized anxiety disorder)   . GERD (gastroesophageal reflux disease)   . Hiatal hernia   . High triglycerides   . History of kidney stones   . History of recurrent UTIs   . History of sepsis 06/2018  . History of stomach ulcers   . History of suicidal ideation   . Hyperlipidemia   . Hypothyroidism   . IDA (iron deficiency anemia)   . Joint pain   . Melanoma ([name]) 11/03/2006   left chest in situ (excision)  . Orthostasis   . Palpitations   . PONV (postoperative nausea and vomiting)   . Prediabetes   . Rapid heart rate   . Renal calculus, left   . Seasonal allergies   . Shortness of breath   . Sleep apnea   . Sleep disorder, unspecified    per excessive sleeping during the day  . Snoring   . Suicide ideation   . Swallowing difficulty   . Tachycardia   . Vertigo   . Vitamin D deficiency   . Weakness   . Wears contact lenses     Past Surgical History:  Procedure Laterality Date  . ANTERIOR CERVICAL DECOMP/DISCECTOMY FUSION  02/17/2012   Procedure: ANTERIOR CERVICAL DECOMPRESSION/DISCECTOMY FUSION 2 LEVELS;  Surgeon: Hosie Spangle, MD;  Location: [place]  [facility] OR[facility];  Service: Neurosurgery;  Laterality: N/A;  Cervical four-five and Cervical six-seven anterior cervial decompression with fusion plating and bonegraft  . ANTERIOR CERVICAL DECOMP/DISCECTOMY FUSION  04-01-2001   _0    C5---6  . BIOPSY  07/20/2018   Procedure: BIOPSY;  Surgeon: Daneil Dolin, MD;  [facility];  Service: Endoscopy;;  gastric   . BREAST REDUCTION SURGERY Bilateral 1995  . CESAREAN SECTION  x2   last one 1998   with BILATERAL TUBAL LIGATION  . CESAREAN SECTION WITH BILATERAL TUBAL LIGATION    . COLONOSCOPY WITH PROPOFOL N/A 07/20/2018   Dr. Gala Romney: Diverticulosis, internal hemorrhoids, 5 mm splenic flexure tubular adenoma removed. Repeat in 5 years due to family history.  . COLONOSCOPY WITH PROPOFOL N/A 12/07/2020   Procedure: COLONOSCOPY WITH PROPOFOL;  Surgeon: Daneil Dolin, MD;  [facility];  Service: Endoscopy;  Laterality: N/A;  2:30pm  . CYSTOSCOPY/URETEROSCOPY/HOLMIUM LASER/STENT PLACEMENT Left 08/21/2018   Procedure: CYSTOSCOPY LEFT RETROGRADE PYELOGRAM /URETEROSCOPY/HOLMIUM LASER/STENT PLACEMENT;  Surgeon: Ceasar Mons, MD;  Location: [facility];  Service: Urology;  Laterality: Left;  . [place]  for miscarriage  . ENDOMETRIAL ABLATION  2014  . ESOPHAGOGASTRODUODENOSCOPY     20 years ago.   Marland Kitchen ESOPHAGOGASTRODUODENOSCOPY (EGD) WITH PROPOFOL N/A 07/20/2018   Dr. Gala Romney: Small hiatal hernia, erythematous mucosa in the stomach with a healing gastric ulcer measuring 1 cm, biopsies negative.  . ESOPHAGOGASTRODUODENOSCOPY (EGD) WITH PROPOFOL N/A 10/05/2020   Procedure: ESOPHAGOGASTRODUODENOSCOPY (EGD) WITH PROPOFOL;  Surgeon: Daneil Dolin, MD;  [facility];  Service: Endoscopy;  Laterality: N/A;  Venia Minks DILATION N/A 10/05/2020   Procedure: Venia Minks DILATION;  Surgeon: Daneil Dolin, MD;  [facility];  Service: Endoscopy;  Laterality: N/A;  .  [name]   unilateral for infertility  . NASAL SINUS SURGERY  2010  approx.  Marland Kitchen POLYPECTOMY  07/20/2018   Procedure: POLYPECTOMY;  Surgeon: Daneil Dolin, MD;  [facility];  Service: Endoscopy;;  . POSTERIOR CERVICAL FUSION/FORAMINOTOMY N/A 08/18/2013   Procedure: CERVICAL FOUR TO CERVICAL SEVEN POSTERIOR CERVICAL FUSION/FORAMINOTOMY LEVEL 3;  Surgeon: Hosie Spangle, MD;  Location: [place] [facility];  Service: Neurosurgery;  Laterality: N/A;  C4-7 posterior cervical fusion with lateral mass fixation  . RIGHT/LEFT HEART CATH AND CORONARY ANGIOGRAPHY N/A 12/23/2019   Procedure: RIGHT/LEFT HEART CATH AND CORONARY ANGIOGRAPHY;  Surgeon: Burnell Blanks, MD;  Location: [place] CV LAB;  Service: Cardiovascular;  Laterality: N/A;    Family History  Problem Relation Age of Onset  . Asthma Mother   . Rheum arthritis Mother   . Non-Hodgkin's lymphoma Mother   . Congestive Heart Failure Mother   . Atrial fibrillation Mother   . Diabetes Mother   . Hyperlipidemia Mother   . Cancer Mother   . Colon cancer Father   . Multiple myeloma Father   . Hypertension Father   . Cancer Father   . Depression Father   . Anxiety disorder Father   . Alcoholism Father   . Obesity Father   . Allergies Other        entire family(mom and dad)  . Diabetes Maternal Grandmother   . Congestive Heart Failure Maternal Grandmother   . Mental illness Paternal Grandmother   . Colon cancer Paternal Grandfather   . Bone cancer Paternal Grandfather     Social History   Tobacco Use  . Smoking status: Former    Packs/day: 1.00    Years: 5.00    Total pack years: 5.00    Types: Cigarettes    Quit date: 02/09/2002    Years since quitting: 19.8    Passive exposure: Past  . Smokeless tobacco: Never  Vaping Use  . Vaping Use: Never used  Substance Use Topics  . Alcohol use: No  . Drug use: No    Medications: {medication reviewed/display:3041432} Allergies as of 12/04/2021        Reactions   Iohexol Hives    Desc: IVP DYE Also developed hives when drinking this contrast responded to benadryl.  Desc: IVP DYE Desc: IVP DYE Also developed hives when drinking this contrast responded to benadryl.   Rosuvastatin Other (See Comments)   Muscle aches with 24m and 221m tolerates 82m63maily without issues Muscle aches with 64m79mscle aches with 10mg18m 64mg,82merates 82mg da6m without issues   Diatrizoate Other (See Comments)        Medication List        Accurate as of December 04, 2021 10:23 AM. If you have any questions, ask your nurse or doctor.  acetaminophen 500 MG tablet Commonly known as: TYLENOL Take 1,000 mg by mouth every 6 (six) hours as needed for moderate pain.   albuterol (2.5 MG/3ML) 0.083% nebulizer solution Commonly known as: PROVENTIL USE 1 VIAL IN NEBULIZER EVERY 4 HOURS AS NEEDED FOR WHEEZING   albuterol 108 (90 Base) MCG/ACT inhaler Commonly known as: VENTOLIN HFA Inhale 2 puffs into the lungs every 6 (six) hours as needed for wheezing or shortness of breath.   aspirin EC 81 MG tablet Take 1 tablet (81 mg total) by mouth daily. Swallow whole.   buPROPion 150 MG 24 hr tablet Commonly known as: WELLBUTRIN XL Take 3 tablets by mouth once daily   cetirizine 10 MG tablet Commonly known as: ZYRTEC Take 10 mg by mouth daily.   clonazePAM 1 MG tablet Commonly known as: KlonoPIN 1 p.o. 3 times daily as needed anxiety and may repeat 1/2 pill during the middle of the day as needed anxiety.  No more than 3.5 mg/day.   hydrocortisone 2.5 % rectal cream Commonly known as: ANUSOL-HC Place 1 application rectally 2-4 times daily for rectal bleeding/burning related to hemorrhoids.   hydrOXYzine 25 MG tablet Commonly known as: ATARAX Take 1-2 tablets (25-50 mg total) by mouth every 6 (six) hours as needed.   lamoTRIgine 200 MG tablet Commonly known as: LAMICTAL TAKE 2 & 1/2 (TWO & ONE-HALF) TABLETS BY MOUTH ONCE DAILY    levothyroxine 75 MCG tablet Commonly known as: SYNTHROID Take 75 mcg by mouth daily before breakfast.   loperamide 2 MG tablet Commonly known as: Imodium A-D Take 1 tablet (2 mg total) by mouth 4 (four) times daily as needed for diarrhea or loose stools.   meclizine 25 MG tablet Commonly known as: ANTIVERT Take 25 mg by mouth 3 (three) times daily as needed for dizziness.   metFORMIN 750 MG 24 hr tablet Commonly known as: GLUCOPHAGE-XR Take 750 mg by mouth daily.   montelukast 10 MG tablet Commonly known as: SINGULAIR Take 10 mg by mouth daily.   ondansetron 8 MG disintegrating tablet Commonly known as: ZOFRAN-ODT Take 1 tablet (8 mg total) by mouth 2 (two) times daily as needed for nausea or vomiting.   Oxcarbazepine 300 MG tablet Commonly known as: TRILEPTAL Take 3 tablets (900 mg total) by mouth at bedtime.   pantoprazole 40 MG tablet Commonly known as: PROTONIX TAKE 1 TABLET BY MOUTH TWICE DAILY WITH A MEAL   psyllium 58.6 % packet Commonly known as: METAMUCIL Take 1 packet by mouth daily.   rosuvastatin 5 MG tablet Commonly known as: CRESTOR Take 1 tablet (5 mg total) by mouth daily.   Semaglutide (1 MG/DOSE) 4 MG/3ML Sopn Inject 1 mg into the skin once a week.   sertraline 100 MG tablet Commonly known as: ZOLOFT TAKE 3 TABLETS BY MOUTH AT BEDTIME         ROS:  {Review of Systems:30496}  Blood pressure 116/78, pulse 76, temperature 98.4 F (36.9 C), temperature source Oral, resp. rate 12, height _0  (1.676 m), weight 202 lb (91.6 kg), SpO2 94 %. Physical Exam  Results: No results found for this or any previous visit (from the past 48 hour(s)).  No results found.   Assessment & Plan:  CAYLEI SPERRY is a 51 y.o. female with *** -*** -*** -Follow up ***  All questions were answered to the satisfaction of the patient and family***.  The risk and benefits of *** were discussed including but not limited to ***.  After careful  consideration, Saachi SAVINA OLSHEFSKI has decided to ***.    Virl Cagey 12/04/2021, 10:23 AM

## 2021-12-04 NOTE — Patient Instructions (Signed)
Monitor the bulge. Monitor symptoms fo the gallbladder versus ozepmic. Nausea/ vomiting/ bloating. If you decided you ultimately want to discuss the gallbladder removal we can consider it with the understanding it may not make the symptoms better.

## 2021-12-06 NOTE — Telephone Encounter (Signed)
Can we get her on [facility]'s schedule for November? Recall coming up for next month.

## 2021-12-17 ENCOUNTER — Encounter (HOSPITAL_COMMUNITY): Payer: Self-pay

## 2021-12-17 ENCOUNTER — Other Ambulatory Visit: Payer: Self-pay

## 2021-12-17 ENCOUNTER — Emergency Department (HOSPITAL_COMMUNITY)
Admission: EM | Admit: 2021-12-17 | Discharge: 2021-12-18 | Disposition: A | Discharge status: Home / Self Care | Payer: 59 | Attending: Emergency Medicine | Admitting: Emergency Medicine

## 2021-12-17 ENCOUNTER — Emergency Department (HOSPITAL_COMMUNITY): Payer: 59

## 2021-12-17 DIAGNOSIS — K5732 Diverticulitis of large intestine without perforation or abscess without bleeding: Secondary | ICD-10-CM | POA: Diagnosis not present

## 2021-12-17 DIAGNOSIS — G8929 Other chronic pain: Secondary | ICD-10-CM | POA: Diagnosis not present

## 2021-12-17 DIAGNOSIS — Z7982 Long term (current) use of aspirin: Secondary | ICD-10-CM | POA: Insufficient documentation

## 2021-12-17 DIAGNOSIS — R1031 Right lower quadrant pain: Secondary | ICD-10-CM | POA: Diagnosis present

## 2021-12-17 DIAGNOSIS — Z7984 Long term (current) use of oral hypoglycemic drugs: Secondary | ICD-10-CM | POA: Diagnosis not present

## 2021-12-17 DIAGNOSIS — D75839 Thrombocytosis, unspecified: Secondary | ICD-10-CM | POA: Insufficient documentation

## 2021-12-17 DIAGNOSIS — D72829 Elevated white blood cell count, unspecified: Secondary | ICD-10-CM | POA: Insufficient documentation

## 2021-12-17 DIAGNOSIS — E119 Type 2 diabetes mellitus without complications: Secondary | ICD-10-CM | POA: Insufficient documentation

## 2021-12-17 DIAGNOSIS — R519 Headache, unspecified: Secondary | ICD-10-CM | POA: Insufficient documentation

## 2021-12-17 DIAGNOSIS — E876 Hypokalemia: Secondary | ICD-10-CM

## 2021-12-17 LAB — CBC WITH DIFFERENTIAL/PLATELET
Abs Immature Granulocytes: 0.07 10*3/uL (ref 0.00–0.07)
Basophils Absolute: 0.1 10*3/uL (ref 0.0–0.1)
Basophils Relative: 1 %
Eosinophils Absolute: 0.5 10*3/uL (ref 0.0–0.5)
Eosinophils Relative: 3 %
HCT: 37.1 % (ref 36.0–46.0)
Hemoglobin: 12.4 g/dL (ref 12.0–15.0)
Immature Granulocytes: 1 %
Lymphocytes Relative: 20 %
Lymphs Abs: 3.1 10*3/uL (ref 0.7–4.0)
MCH: 28.6 pg (ref 26.0–34.0)
MCHC: 33.4 g/dL (ref 30.0–36.0)
MCV: 85.7 fL (ref 80.0–100.0)
Monocytes Absolute: 1.2 10*3/uL — ABNORMAL HIGH (ref 0.1–1.0)
Monocytes Relative: 8 %
Neutro Abs: 10.4 10*3/uL — ABNORMAL HIGH (ref 1.7–7.7)
Neutrophils Relative %: 67 %
Platelets: 424 10*3/uL — ABNORMAL HIGH (ref 150–400)
RBC: 4.33 MIL/uL (ref 3.87–5.11)
RDW: 13.5 % (ref 11.5–15.5)
WBC: 15.4 10*3/uL — ABNORMAL HIGH (ref 4.0–10.5)
nRBC: 0 % (ref 0.0–0.2)

## 2021-12-17 MED ORDER — DIPHENHYDRAMINE HCL 50 MG/ML IJ SOLN
25.0000 mg | Freq: Once | INTRAMUSCULAR | Status: AC
  Administered 2021-12-17: 25 mg via INTRAVENOUS
  Filled 2021-12-17: qty 1

## 2021-12-17 MED ORDER — LACTATED RINGERS IV BOLUS
1000.0000 mL | Freq: Once | INTRAVENOUS | Status: AC
  Administered 2021-12-17: 1000 mL via INTRAVENOUS

## 2021-12-17 MED ORDER — PROCHLORPERAZINE EDISYLATE 10 MG/2ML IJ SOLN
10.0000 mg | Freq: Once | INTRAMUSCULAR | Status: AC
  Administered 2021-12-17: 10 mg via INTRAVENOUS
  Filled 2021-12-17: qty 2

## 2021-12-17 NOTE — Telephone Encounter (Signed)
Please arrange an appt.

## 2021-12-17 NOTE — Progress Notes (Deleted)
GI Office Note    Referring Provider: Barrie Lyme Primary Care Physician:  Barrie Lyme  Primary Gastroenterologist: Garfield Cornea, MD   Chief Complaint   No chief complaint on file.   History of Present Illness   Kristen [name] is a 51 y.o. female presenting today for next day appointment for constipation, abdominal pain. Last seen 11/09/21. She has h/o GERD, diverticulitis (both duodenal and colonic), fatty liver, nausea, chronic constipation, RUQ bulging.   Last colonoscopy was in 11/2020, pancolonic diverticulosis. EGD 09/2020, normal. Esophagus dilated due to history of dysphagia.       Expand All Collapse All        GI Office Note     Referring Provider: Barrie Lyme Primary Care Physician:  Barrie Lyme  Primary Gastroenterologist: Garfield Cornea, MD     Chief Complaint        Chief Complaint  Patient presents with   Follow-up      Still having left lower abdominal pain, states there is a "knot" on her upper right side.       History of Present Illness    Kristen [name] is a 51 y.o. female presenting today for follow up. She has h/o GERD, diverticulitis (both duodenal and colonic), fatty liver, nausea, chronic constipation, RUQ bulging. Patient last seen in office 10/2021.   Started Ozempic in 06/2021 weighted 237 pounds at that time.   Last colonoscopy was in 11/2020, pancolonic diverticulosis. EGD 09/2020, normal. Esophagus dilated due to history of dysphagia.     CT A/P without contrast 06/2021: mild hepatic steatosis.    CT A/P without contrast 05/2021:  1. Subtle inflammatory changes adjacent to a diverticulum in the  third portion of the duodenum, concerning for mild acute duodenal  diverticulitis. No diverticular abscess or signs of frank  perforation are noted at this time.  2. Colonic diverticulosis without evidence to suggest an acute  colonic diverticulitis at this time.  3. Inflammatory  changes in the left side of the abdominal wall  concerning for potential cellulitis, as above. Correlation with  physical examination is recommended.  4. Severe hepatic steatosis.  5. 2 mm nonobstructive calculus in the upper pole collecting system  of the right kidney. No ureteral stones or findings of urinary tract  obstruction at this time.  6. Additional incidental findings, as above.     Medications   Current Outpatient Medications  Medication Sig Dispense Refill   acetaminophen (TYLENOL) 500 MG tablet Take 1,000 mg by mouth every 6 (six) hours as needed for moderate pain.     albuterol (PROVENTIL) (2.5 MG/3ML) 0.083% nebulizer solution USE 1 VIAL IN NEBULIZER EVERY 4 HOURS AS NEEDED FOR WHEEZING     albuterol (VENTOLIN HFA) 108 (90 Base) MCG/ACT inhaler Inhale 2 puffs into the lungs every 6 (six) hours as needed for wheezing or shortness of breath. 18 g 1   aspirin EC 81 MG tablet Take 1 tablet (81 mg total) by mouth daily. Swallow whole. 30 tablet 11   buPROPion (WELLBUTRIN XL) 150 MG 24 hr tablet Take 3 tablets by mouth once daily 270 tablet 0   cetirizine (ZYRTEC) 10 MG tablet Take 10 mg by mouth daily.     clonazePAM (KLONOPIN) 1 MG tablet 1 p.o. 3 times daily as needed anxiety and may repeat 1/2 pill during the middle of the day as needed anxiety.  No more than 3.5 mg/day. 105 tablet 5  hydrocortisone (ANUSOL-HC) 2.5 % rectal cream Place 1 application rectally 2-4 times daily for rectal bleeding/burning related to hemorrhoids. 30 g 1   hydrOXYzine (ATARAX) 25 MG tablet Take 1-2 tablets (25-50 mg total) by mouth every 6 (six) hours as needed. 180 tablet 3   lamoTRIgine (LAMICTAL) 200 MG tablet TAKE 2 & 1/2 (TWO & ONE-HALF) TABLETS BY MOUTH ONCE DAILY 225 tablet 3   levothyroxine (SYNTHROID) 75 MCG tablet Take 75 mcg by mouth daily before breakfast.      loperamide (IMODIUM A-D) 2 MG tablet Take 1 tablet (2 mg total) by mouth 4 (four) times daily as needed for diarrhea or loose  stools. 40 tablet 0   meclizine (ANTIVERT) 25 MG tablet Take 25 mg by mouth 3 (three) times daily as needed for dizziness.     metFORMIN (GLUCOPHAGE-XR) 750 MG 24 hr tablet Take 750 mg by mouth daily.     montelukast (SINGULAIR) 10 MG tablet Take 10 mg by mouth daily.      ondansetron (ZOFRAN-ODT) 8 MG disintegrating tablet Take 1 tablet (8 mg total) by mouth 2 (two) times daily as needed for nausea or vomiting. 60 tablet 5   Oxcarbazepine (TRILEPTAL) 300 MG tablet Take 3 tablets (900 mg total) by mouth at bedtime. 270 tablet 3   pantoprazole (PROTONIX) 40 MG tablet TAKE 1 TABLET BY MOUTH TWICE DAILY WITH A MEAL 180 tablet 2   psyllium (METAMUCIL) 58.6 % packet Take 1 packet by mouth daily.     rosuvastatin (CRESTOR) 5 MG tablet Take 1 tablet (5 mg total) by mouth daily. 90 tablet 3   Semaglutide, 1 MG/DOSE, 4 MG/3ML SOPN Inject 1 mg into the skin once a week. 3 mL 11   sertraline (ZOLOFT) 100 MG tablet TAKE 3 TABLETS BY MOUTH AT BEDTIME 90 tablet 11   No current facility-administered medications for this visit.    Allergies   Allergies as of 12/18/2021 - Review Complete 12/04/2021  Allergen Reaction Noted   Iohexol Hives 11/03/2007   Rosuvastatin Other (See Comments) 02/09/2020   Diatrizoate Other (See Comments) 02/16/2021     Past Medical History   Past Medical History:  Diagnosis Date   Allergies    Anemia    Anxiety    Arthritis    Atypical mole 11/03/2006   mid upper back (slight to moderate)   Atypical mole 11/03/2006   lower right back (slight to moderate)   Back pain    Bipolar 1 disorder (HCC)    Borderline diabetic    Chronic constipation    Constipation    Depression    Family history of adverse reaction to anesthesia    mother-- ponv   Fatty liver    GAD (generalized anxiety disorder)    GERD (gastroesophageal reflux disease)    Hiatal hernia    High triglycerides    History of kidney stones    History of recurrent UTIs    History of sepsis 06/2018    History of stomach ulcers    History of suicidal ideation    Hyperlipidemia    Hypothyroidism    IDA (iron deficiency anemia)    Joint pain    Melanoma ([place]) 11/03/2006   left chest in situ (excision)   Orthostasis    Palpitations    PONV (postoperative nausea and vomiting)    Prediabetes    Rapid heart rate    Renal calculus, left    Seasonal allergies    Shortness of breath  Sleep apnea    Sleep disorder, unspecified    per excessive sleeping during the day   Snoring    Suicide ideation    Swallowing difficulty    Tachycardia    Vertigo    Vitamin D deficiency    Weakness    Wears contact lenses     Past Surgical History   Past Surgical History:  Procedure Laterality Date   ANTERIOR CERVICAL DECOMP/DISCECTOMY FUSION  02/17/2012   Procedure: ANTERIOR CERVICAL DECOMPRESSION/DISCECTOMY FUSION 2 LEVELS;  Surgeon: Hosie Spangle, MD;  Location: [place] [facility];  Service: Neurosurgery;  Laterality: N/A;  Cervical four-five and Cervical six-seven anterior cervial decompression with fusion plating and bonegraft   ANTERIOR CERVICAL DECOMP/DISCECTOMY FUSION  04-01-2001   _0    C5---6   BIOPSY  07/20/2018   Procedure: BIOPSY;  Surgeon: Daneil Dolin, MD;  [facility];  Service: Endoscopy;;  gastric    BREAST REDUCTION SURGERY Bilateral 1995   [place]  x2   last one 1998   with BILATERAL TUBAL LIGATION   CESAREAN SECTION WITH BILATERAL TUBAL LIGATION     COLONOSCOPY WITH PROPOFOL N/A 07/20/2018   Dr. Gala Romney: Diverticulosis, internal hemorrhoids, 5 mm splenic flexure tubular adenoma removed. Repeat in 5 years due to family history.   COLONOSCOPY WITH PROPOFOL N/A 12/07/2020   Procedure: COLONOSCOPY WITH PROPOFOL;  Surgeon: Daneil Dolin, MD;  [facility]: [facility];  Service: Endoscopy;  Laterality: N/A;  2:30pm   CYSTOSCOPY/URETEROSCOPY/HOLMIUM LASER/STENT PLACEMENT Left 08/21/2018   Procedure: CYSTOSCOPY LEFT RETROGRADE PYELOGRAM  /URETEROSCOPY/HOLMIUM LASER/STENT PLACEMENT;  Surgeon: Ceasar Mons, MD;  Location: [facility];  Service: Urology;  Laterality: Left;   DILATION AND CURETTAGE OF UTERUS  1992   for miscarriage   ENDOMETRIAL ABLATION  2014   ESOPHAGOGASTRODUODENOSCOPY     20 years ago.    ESOPHAGOGASTRODUODENOSCOPY (EGD) WITH PROPOFOL N/A 07/20/2018   Dr. Gala Romney: Small hiatal hernia, erythematous mucosa in the stomach with a healing gastric ulcer measuring 1 cm, biopsies negative.   ESOPHAGOGASTRODUODENOSCOPY (EGD) WITH PROPOFOL N/A 10/05/2020   Procedure: ESOPHAGOGASTRODUODENOSCOPY (EGD) WITH PROPOFOL;  Surgeon: Daneil Dolin, MD;  [facility];  Service: Endoscopy;  Laterality: N/A;   MALONEY DILATION N/A 10/05/2020   Procedure: Venia Minks DILATION;  Surgeon: Daneil Dolin, MD;  [facility];  Service: Endoscopy;  Laterality: N/A;   [place]   unilateral for infertility   NASAL SINUS SURGERY  2010  approx.   POLYPECTOMY  07/20/2018   Procedure: POLYPECTOMY;  Surgeon: Daneil Dolin, MD;  [facility];  Service: Endoscopy;;   POSTERIOR CERVICAL FUSION/FORAMINOTOMY N/A 08/18/2013   Procedure: CERVICAL FOUR TO CERVICAL SEVEN POSTERIOR CERVICAL FUSION/FORAMINOTOMY LEVEL 3;  Surgeon: Hosie Spangle, MD;  Location: [place] [facility];  Service: Neurosurgery;  Laterality: N/A;  C4-7 posterior cervical fusion with lateral mass fixation   RIGHT/LEFT HEART CATH AND CORONARY ANGIOGRAPHY N/A 12/23/2019   Procedure: RIGHT/LEFT HEART CATH AND CORONARY ANGIOGRAPHY;  Surgeon: Burnell Blanks, MD;  Location: [place] [facility];  Service: Cardiovascular;  Laterality: N/A;    Past Family History   Family History  Problem Relation Age of Onset   Asthma Mother    Rheum arthritis Mother    Non-Hodgkin's lymphoma Mother    Congestive Heart Failure Mother    Atrial fibrillation Mother    Diabetes Mother    Hyperlipidemia Mother    Cancer Mother     Colon cancer  Father    Multiple myeloma Father    Hypertension Father    Cancer Father    Depression Father    Anxiety disorder Father    Alcoholism Father    Obesity Father    Allergies Other        entire family(mom and dad)   Diabetes Maternal Grandmother    Congestive Heart Failure Maternal Grandmother    Mental illness Paternal Grandmother    Colon cancer Paternal Grandfather    Bone cancer Paternal Grandfather     Past Social History   Social History   Socioeconomic History   Marital status: Married    Spouse name: Not on file   Number of children: 2   Years of education: Not on file   Highest education level: Not on file  Occupational History   Occupation: disability  Tobacco Use   Smoking status: Former    Packs/day: 1.00    Years: 5.00    Total pack years: 5.00    Types: Cigarettes    Quit date: 02/09/2002    Years since quitting: 19.8    Passive exposure: Past   Smokeless tobacco: Never  Vaping Use   Vaping Use: Never used  Substance and Sexual Activity   Alcohol use: No   Drug use: No   Sexual activity: Yes    Partners: Male    Birth control/protection: Surgical    Comment: spouse  Other Topics Concern   Not on file  Social History Narrative   Step brother-2   Step sister-2   Half sister -1 healthy   Social Determinants of Health   Financial Resource Strain: Low Risk  (07/10/2018)   Overall Financial Resource Strain (CARDIA)    Difficulty of Paying Living Expenses: Not very hard  Food Insecurity: No Food Insecurity (07/10/2018)   Hunger Vital Sign    Worried About Running Out of Food in the Last Year: Never true    Ran Out of Food in the Last Year: Never true  Transportation Needs: No Transportation Needs (07/10/2018)   PRAPARE - Hydrologist (Medical): No    Lack of Transportation (Non-Medical): No  Physical Activity: Unknown (07/10/2018)   Exercise Vital Sign    Days of Exercise per Week: Patient refused     Minutes of Exercise per Session: Patient refused  Stress: Stress Concern Present (07/10/2018)   [place]    Feeling of Stress : Rather much  Social Connections: Unknown (07/10/2018)   Social Connection and Isolation Panel [NHANES]    Frequency of Communication with Friends and Family: Patient refused    Frequency of Social Gatherings with Friends and Family: Patient refused    Attends Religious Services: Patient refused    Active Member of Clubs or Organizations: Patient refused    Attends Archivist Meetings: Patient refused    Marital Status: Patient refused  Intimate Partner Violence: Not At Risk (07/10/2018)   Humiliation, Afraid, Rape, and Kick questionnaire    Fear of Current or Ex-Partner: No    Emotionally Abused: No    Physically Abused: No    Sexually Abused: No    Review of Systems   General: Negative for anorexia, weight loss, fever, chills, fatigue, weakness. ENT: Negative for hoarseness, difficulty swallowing , nasal congestion. CV: Negative for chest pain, angina, palpitations, dyspnea on exertion, peripheral edema.  Respiratory: Negative for dyspnea at rest, dyspnea on exertion, cough, sputum, wheezing.  GI:  See history of present illness. GU:  Negative for dysuria, hematuria, urinary incontinence, urinary frequency, nocturnal urination.  Endo: Negative for unusual weight change.     Physical Exam   There were no vitals taken for this visit.   General: Well-nourished, well-developed in no acute distress.  Eyes: No icterus. Mouth: Oropharyngeal mucosa moist and pink , no lesions erythema or exudate. Lungs: Clear to auscultation bilaterally.  Heart: Regular rate and rhythm, no murmurs rubs or gallops.  Abdomen: Bowel sounds are normal, nontender, nondistended, no hepatosplenomegaly or masses,  no abdominal bruits or hernia , no rebound or guarding.  Rectal: ***  Extremities: No lower  extremity edema. No clubbing or deformities. Neuro: Alert and oriented x 4   Skin: Warm and dry, no jaundice.   Psych: Alert and cooperative, normal mood and affect.  Labs   *** Imaging Studies   No results found.  Assessment       PLAN   ***   Laureen Ochs. Bobby Rumpf, [place], [facility]

## 2021-12-17 NOTE — Telephone Encounter (Signed)
Please make her an appt.

## 2021-12-17 NOTE — ED Provider Notes (Signed)
[facility] EMERGENCY DEPARTMENT Provider Note   CSN: [id number] Arrival date & time: 12/17/21  2249     History {Add pertinent medical, surgical, social history, OB history to HPI:1} Chief complaint: Abdominal pain  Kristen [name] is a 51 y.o. female.  The history is provided by the patient.  She has history of diabetes, hyperlipidemia, bipolar disorder, GERD, peptic ulcer disease, diverticulitis and comes in with worsening abdominal pain.  She has been having chronic abdominal pain for several months, but over the last 2 days she has developed severe pain in bilateral lateral mid abdomen radiating across into the periumbilical area and also into the right scapular area.  She has chronic nausea which has not changed.  She always feels cold but does endorse some chills but denies fever or sweats.  She has chronic constipation which is unchanged.  She also relates that for the last 2 months, she has had spontaneous bruising which tends to occur mainly in her proximal thighs.  She has a chronic left sided headache which has been present for months and is not any different today.  She does endorse some generalized fatigue.  She has been evaluated for gallbladder disease and is scheduled to see a surgeon about possible cholecystectomy.  She also has a bulge on the right side of her abdomen which has been evaluated by a surgeon and was told that there was no hernia there.   Home Medications Prior to Admission medications   Medication Sig Start Date End Date Taking? Authorizing Provider  acetaminophen (TYLENOL) 500 MG tablet Take 1,000 mg by mouth every 6 (six) hours as needed for moderate pain.    [provider]  albuterol (PROVENTIL) (2.5 MG/3ML) 0.083% nebulizer solution USE 1 VIAL IN NEBULIZER EVERY 4 HOURS AS NEEDED FOR WHEEZING    [provider]  albuterol (VENTOLIN HFA) 108 (90 Base) MCG/ACT inhaler Inhale 2 puffs into the lungs every 6 (six) hours as needed for wheezing  or shortness of breath. 10/31/20   Kozlow, Donnamarie Poag, MD  aspirin EC 81 MG tablet Take 1 tablet (81 mg total) by mouth daily. Swallow whole. 12/17/19   Freada Bergeron, MD  buPROPion (WELLBUTRIN XL) 150 MG 24 hr tablet Take 3 tablets by mouth once daily 11/20/21   [name] T, PA-C  cetirizine (ZYRTEC) 10 MG tablet Take 10 mg by mouth daily.    [provider]  clonazePAM (KLONOPIN) 1 MG tablet 1 p.o. 3 times daily as needed anxiety and may repeat 1/2 pill during the middle of the day as needed anxiety.  No more than 3.5 mg/day. 09/27/21   Addison Lank, PA-C  hydrocortisone (ANUSOL-HC) 2.5 % rectal cream Place 1 application rectally 2-4 times daily for rectal bleeding/burning related to hemorrhoids. 07/20/20   Erenest Rasher, PA-C  hydrOXYzine (ATARAX) 25 MG tablet Take 1-2 tablets (25-50 mg total) by mouth every 6 (six) hours as needed. 07/05/21   [name] T, PA-C  lamoTRIgine (LAMICTAL) 200 MG tablet TAKE 2 & 1/2 (TWO & ONE-HALF) TABLETS BY MOUTH ONCE DAILY 07/05/21   Addison Lank, PA-C  levothyroxine (SYNTHROID) 75 MCG tablet Take 75 mcg by mouth daily before breakfast.  05/27/18   [provider]  loperamide (IMODIUM A-D) 2 MG tablet Take 1 tablet (2 mg total) by mouth 4 (four) times daily as needed for diarrhea or loose stools. 10/15/21   Gildardo Pounds, NP  meclizine (ANTIVERT) 25 MG tablet Take 25 mg by mouth  3 (three) times daily as needed for dizziness.    [provider]  metFORMIN (GLUCOPHAGE-XR) 750 MG 24 hr tablet Take 750 mg by mouth daily. 08/11/21   [provider]  montelukast (SINGULAIR) 10 MG tablet Take 10 mg by mouth daily.  10/26/18   [provider]  ondansetron (ZOFRAN-ODT) 8 MG disintegrating tablet Take 1 tablet (8 mg total) by mouth 2 (two) times daily as needed for nausea or vomiting. 11/09/21   Mahala Menghini, PA-C  Oxcarbazepine (TRILEPTAL) 300 MG tablet Take 3 tablets (900 mg total) by mouth at bedtime. 03/14/21   [name] T, PA-C  pantoprazole (PROTONIX) 40 MG tablet TAKE 1 TABLET BY MOUTH TWICE DAILY WITH A MEAL 10/29/21   Rourk, Cristopher Estimable, MD  psyllium (METAMUCIL) 58.6 % packet Take 1 packet by mouth daily.    [provider]  rosuvastatin (CRESTOR) 5 MG tablet Take 1 tablet (5 mg total) by mouth daily. 07/17/21   Freada Bergeron, MD  Semaglutide, 1 MG/DOSE, 4 MG/3ML SOPN Inject 1 mg into the skin once a week. 11/28/21   Freada Bergeron, MD  sertraline (ZOLOFT) 100 MG tablet TAKE 3 TABLETS BY MOUTH AT BEDTIME 07/24/21   [name] T, PA-C      Allergies    Iohexol, Rosuvastatin, and Diatrizoate    Review of Systems   Review of Systems  All other systems reviewed and are negative.   Physical Exam Updated Vital Signs BP 114/80   Pulse 79   Temp 97.8 F (36.6 C) (Oral)   Resp 20   Ht '5\' 6"'$  (1.676 m)   Wt 90.7 kg   SpO2 98%   BMI 32.28 kg/m  Physical Exam Vitals and nursing note reviewed.   51 year old female, resting comfortably and in no acute distress. Vital signs are normal. Oxygen saturation is 98%, which is normal. Head is normocephalic and atraumatic. PERRLA, EOMI. Oropharynx is clear. Neck is nontender and supple without adenopathy or JVD. Back is nontender and there is no CVA tenderness. Lungs are clear without rales, wheezes, or rhonchi. Chest is nontender. Heart has regular rate and rhythm without murmur. Abdomen is soft, flat, with moderate right lower quadrant tenderness.  There is some voluntary guarding, no rebound tenderness.  Peristalsis is hypoactive. Extremities have no cyanosis or edema, full range of motion is present. Skin is warm and dry without rash.  Patient showed me where she is having the spontaneous bruises, but I cannot see any actual bruising there. Neurologic: Mental status is normal, cranial nerves are intact, moves all extremities equally.  ED Results / Procedures / Treatments   Labs (all labs ordered are listed, but only abnormal  results are displayed) Labs Reviewed  COMPREHENSIVE METABOLIC PANEL  LIPASE, BLOOD  CBC WITH DIFFERENTIAL/PLATELET  URINALYSIS, ROUTINE W REFLEX MICROSCOPIC    EKG None  Radiology No results found.  Procedures Procedures  {Document cardiac monitor, telemetry assessment procedure when appropriate:1}  Medications Ordered in ED Medications  lactated ringers bolus 1,000 mL (has no administration in time range)  prochlorperazine (COMPAZINE) injection 10 mg (has no administration in time range)  diphenhydrAMINE (BENADRYL) injection 25 mg (has no administration in time range)    ED Course/ Medical Decision Making/ A&P                           Medical Decision Making Amount and/or Complexity of Data Reviewed Labs: ordered. Radiology: ordered.  Risk Prescription drug management.   Abdominal pain with fairly localized tenderness in the right lower abdomen concerning for appendicitis.  Differential diagnosis includes, but is not limited to, diverticulitis, urinary tract infection, pyelonephritis, cholecystitis, pancreatitis.  Doubt bowel obstruction.  Also, chronic abdominal pain could be related to adhesions.  I have reviewed her past records, and on 08/23/2021 she had a HIDA scan which was normal.  On 10/12/2020, she had a CT of abdomen and pelvis which showed diverticulosis and fibroid uterus.  On 04/08/2020 she had an ultrasound of her abdomen which was normal.  On 11/30/2021 she was evaluated by a general surgeon and an office visit regarding possible abdominal hernia, found not to have a hernia.  Patient states that she is being referred back to the surgeon for possible cholecystectomy.  Because of localized tenderness, I feel a CT of abdomen and pelvis is necessary to rule out appendicitis, diverticulitis.  I have ordered laboratory work-up of CBC, comprehensive metabolic panel, lipase, urinalysis.  I have ordered a CT of abdomen and pelvis without IV contrast because of history of  contrast allergy.  I have also ordered a headache cocktail of lactated Ringer's solution, prochlorperazine, diphenhydramine.  I am also curious to see her platelet count because of the report of spontaneous bruising although no bruises noted on my exam.  {Document critical care time when appropriate:1} {Document review of labs and clinical decision tools ie heart score, Chads2Vasc2 etc:1}  {Document your independent review of radiology images, and any outside records:1} {Document your discussion with family members, caretakers, and with consultants:1} {Document social determinants of health affecting pt's care:1} {Document your decision making why or why not admission, treatments were needed:1} Final Clinical Impression(s) / ED Diagnoses Final diagnoses:  None    Rx / DC Orders ED Discharge Orders     None

## 2021-12-17 NOTE — ED Triage Notes (Signed)
Pt c/o of abdominal pain x several months but that it got worse yesterday. Pain is constant and starts on both sides and radiates to the middle of her abdomen. Pt complaining of nausea with no vomiting.

## 2021-12-17 NOTE — Telephone Encounter (Signed)
Too much to address over [employer]. She needs an ov. I can help her with her constipation but a lot of her other stomach issues are currently being addressed by surgery with regards to her gallbladder.

## 2021-12-18 ENCOUNTER — Ambulatory Visit: Payer: 59 | Admitting: Gastroenterology

## 2021-12-18 DIAGNOSIS — K5732 Diverticulitis of large intestine without perforation or abscess without bleeding: Secondary | ICD-10-CM | POA: Diagnosis not present

## 2021-12-18 LAB — COMPREHENSIVE METABOLIC PANEL
ALT: 25 U/L (ref 0–44)
AST: 20 U/L (ref 15–41)
Albumin: 4.4 g/dL (ref 3.5–5.0)
Alkaline Phosphatase: 111 U/L (ref 38–126)
Anion gap: 9 (ref 5–15)
BUN: 21 mg/dL — ABNORMAL HIGH (ref 6–20)
CO2: 26 mmol/L (ref 22–32)
Calcium: 9.2 mg/dL (ref 8.9–10.3)
Chloride: 104 mmol/L (ref 98–111)
Creatinine, Ser: 0.99 mg/dL (ref 0.44–1.00)
GFR, Estimated: >60 mL/min (ref >60)
Glucose, Bld: 83 mg/dL (ref 70–99)
Potassium: 3.4 mmol/L — ABNORMAL LOW (ref 3.5–5.1)
Sodium: 139 mmol/L (ref 135–145)
Total Bilirubin: 0.6 mg/dL (ref 0.3–1.2)
Total Protein: 7.6 g/dL (ref 6.5–8.1)

## 2021-12-18 LAB — URINALYSIS, ROUTINE W REFLEX MICROSCOPIC
Bilirubin Urine: NEGATIVE
Glucose, UA: NEGATIVE mg/dL
Hgb urine dipstick: NEGATIVE
Ketones, ur: NEGATIVE mg/dL
Leukocytes,Ua: NEGATIVE
Nitrite: NEGATIVE
Protein, ur: NEGATIVE mg/dL
Specific Gravity, Urine: 1.006 (ref 1.005–1.030)
pH: 6 (ref 5.0–8.0)

## 2021-12-18 LAB — LIPASE, BLOOD: Lipase: 45 U/L (ref 11–51)

## 2021-12-18 MED ORDER — POTASSIUM CHLORIDE CRYS ER 20 MEQ PO TBCR: 20.0000 meq | EXTENDED_RELEASE_TABLET | Freq: Two times a day (BID) | ORAL | 0 refills | Status: DC

## 2021-12-18 MED ORDER — METRONIDAZOLE 500 MG/100ML IV SOLN
500.0000 mg | Freq: Once | INTRAVENOUS | Status: AC
  Administered 2021-12-18: 500 mg via INTRAVENOUS
  Filled 2021-12-18: qty 100

## 2021-12-18 MED ORDER — CIPROFLOXACIN HCL 500 MG PO TABS: 500.0000 mg | ORAL_TABLET | Freq: Two times a day (BID) | ORAL | 0 refills | Status: DC

## 2021-12-18 MED ORDER — METRONIDAZOLE 500 MG PO TABS: 500.0000 mg | ORAL_TABLET | Freq: Three times a day (TID) | ORAL | 0 refills | Status: DC

## 2021-12-18 MED ORDER — CIPROFLOXACIN IN D5W 400 MG/200ML IV SOLN
400.0000 mg | Freq: Once | INTRAVENOUS | Status: AC
  Administered 2021-12-18: 400 mg via INTRAVENOUS
  Filled 2021-12-18: qty 200

## 2021-12-18 MED ORDER — POTASSIUM CHLORIDE CRYS ER 20 MEQ PO TBCR
40.0000 meq | EXTENDED_RELEASE_TABLET | Freq: Once | ORAL | Status: AC
  Administered 2021-12-18: 40 meq via ORAL
  Filled 2021-12-18: qty 2

## 2021-12-18 MED ORDER — DEXAMETHASONE SODIUM PHOSPHATE 10 MG/ML IJ SOLN
10.0000 mg | Freq: Once | INTRAMUSCULAR | Status: AC
  Administered 2021-12-18: 10 mg via INTRAVENOUS
  Filled 2021-12-18: qty 1

## 2021-12-18 NOTE — Telephone Encounter (Signed)
Patient had an 8am appt this morning but she left a message to cx saying she just got back home after being seen in the [facility] ED last night.  She said she was diagnosed with diverticulitis.

## 2021-12-18 NOTE — Discharge Instructions (Addendum)
You may take acetaminophen or an anti-inflammatory painkiller such as ibuprofen or naproxen as needed for pain.  If you need additional pain relief, you may take both acetaminophen and an anti-inflammatory painkiller.  Please follow-up with either your primary care provider or your gastroenterologist in 2 weeks to make sure that you have responded appropriately to the antibiotic.  Return to the emergency department if symptoms are getting worse, or if you are not seeing a trend towards improvement after 2 days on the antibiotic.

## 2021-12-18 NOTE — ED Notes (Signed)
Pt finished drinking contrast for CT

## 2021-12-24 NOTE — Progress Notes (Unsigned)
GI Office Note    Referring Provider: Barrie Lyme Primary Care Physician:  Barrie Lyme  Primary Gastroenterologist: Garfield Cornea, MD   Chief Complaint   No chief complaint on file.   History of Present Illness   Kristen [name] is a 51 y.o. female presenting today for follow up ER visit. She has h/o GERD, diverticulitis (both duodenal and colonic), fatty liver, nausea, chronic constipation. CT documented sigmoid diverticulitis 07/2020 and descending colon diverticulitis 04/2021, 12/2021. Duodenal diverticulitis 05/2021.   In the ED 12/17/21: WBC 15400. CT with mild sigmoid diverticulitis, given cipro/flagyl.   CT A/P with contrast 12/2021: IMPRESSION: 1. Mild sigmoid diverticulitis. 2. 2 mm nonobstructing right renal calculi. 3. Findings likely consistent with a fibroid uterus. Correlation with nonemergent pelvic ultrasound is recommended.   Last colonoscopy was in 11/2020, pancolonic diverticulosis. EGD 09/2020, normal. Esophagus dilated due to history of dysphagia.    Medications   Current Outpatient Medications  Medication Sig Dispense Refill   acetaminophen (TYLENOL) 500 MG tablet Take 1,000 mg by mouth every 6 (six) hours as needed for moderate pain.     albuterol (PROVENTIL) (2.5 MG/3ML) 0.083% nebulizer solution USE 1 VIAL IN NEBULIZER EVERY 4 HOURS AS NEEDED FOR WHEEZING     albuterol (VENTOLIN HFA) 108 (90 Base) MCG/ACT inhaler Inhale 2 puffs into the lungs every 6 (six) hours as needed for wheezing or shortness of breath. 18 g 1   aspirin EC 81 MG tablet Take 1 tablet (81 mg total) by mouth daily. Swallow whole. 30 tablet 11   buPROPion (WELLBUTRIN XL) 150 MG 24 hr tablet Take 3 tablets by mouth once daily 270 tablet 0   cetirizine (ZYRTEC) 10 MG tablet Take 10 mg by mouth daily.     ciprofloxacin (CIPRO) 500 MG tablet Take 1 tablet (500 mg total) by mouth 2 (two) times daily. 20 tablet 0   clonazePAM (KLONOPIN) 1 MG tablet 1 p.o. 3  times daily as needed anxiety and may repeat 1/2 pill during the middle of the day as needed anxiety.  No more than 3.5 mg/day. 105 tablet 5   hydrocortisone (ANUSOL-HC) 2.5 % rectal cream Place 1 application rectally 2-4 times daily for rectal bleeding/burning related to hemorrhoids. 30 g 1   hydrOXYzine (ATARAX) 25 MG tablet Take 1-2 tablets (25-50 mg total) by mouth every 6 (six) hours as needed. 180 tablet 3   lamoTRIgine (LAMICTAL) 200 MG tablet TAKE 2 & 1/2 (TWO & ONE-HALF) TABLETS BY MOUTH ONCE DAILY 225 tablet 3   levothyroxine (SYNTHROID) 75 MCG tablet Take 75 mcg by mouth daily before breakfast.      loperamide (IMODIUM A-D) 2 MG tablet Take 1 tablet (2 mg total) by mouth 4 (four) times daily as needed for diarrhea or loose stools. 40 tablet 0   meclizine (ANTIVERT) 25 MG tablet Take 25 mg by mouth 3 (three) times daily as needed for dizziness.     metFORMIN (GLUCOPHAGE-XR) 750 MG 24 hr tablet Take 750 mg by mouth daily.     metroNIDAZOLE (FLAGYL) 500 MG tablet Take 1 tablet (500 mg total) by mouth 3 (three) times daily. 30 tablet 0   montelukast (SINGULAIR) 10 MG tablet Take 10 mg by mouth daily.      ondansetron (ZOFRAN-ODT) 8 MG disintegrating tablet Take 1 tablet (8 mg total) by mouth 2 (two) times daily as needed for nausea or vomiting. 60 tablet 5   Oxcarbazepine (TRILEPTAL) 300 MG tablet Take 3  tablets (900 mg total) by mouth at bedtime. 270 tablet 3   pantoprazole (PROTONIX) 40 MG tablet TAKE 1 TABLET BY MOUTH TWICE DAILY WITH A MEAL 180 tablet 2   potassium chloride SA (KLOR-CON M) 20 MEQ tablet Take 1 tablet (20 mEq total) by mouth 2 (two) times daily. 10 tablet 0   psyllium (METAMUCIL) 58.6 % packet Take 1 packet by mouth daily.     rosuvastatin (CRESTOR) 5 MG tablet Take 1 tablet (5 mg total) by mouth daily. 90 tablet 3   Semaglutide, 1 MG/DOSE, 4 MG/3ML SOPN Inject 1 mg into the skin once a week. 3 mL 11   sertraline (ZOLOFT) 100 MG tablet TAKE 3 TABLETS BY MOUTH AT BEDTIME 90  tablet 11   No current facility-administered medications for this visit.    Allergies   Allergies as of 12/25/2021 - Review Complete 12/17/2021  Allergen Reaction Noted   Iohexol Hives 11/03/2007   Rosuvastatin Other (See Comments) 02/09/2020   Diatrizoate Other (See Comments) 02/16/2021     Past Medical History   Past Medical History:  Diagnosis Date   Allergies    Anemia    Anxiety    Arthritis    Atypical mole 11/03/2006   mid upper back (slight to moderate)   Atypical mole 11/03/2006   lower right back (slight to moderate)   Back pain    Bipolar 1 disorder (HCC)    Borderline diabetic    Chronic constipation    Constipation    Depression    Family history of adverse reaction to anesthesia    mother-- ponv   Fatty liver    GAD (generalized anxiety disorder)    GERD (gastroesophageal reflux disease)    Hiatal hernia    High triglycerides    History of kidney stones    History of recurrent UTIs    History of sepsis 06/2018   History of stomach ulcers    History of suicidal ideation    Hyperlipidemia    Hypothyroidism    IDA (iron deficiency anemia)    Joint pain    Melanoma ([place]) 11/03/2006   left chest in situ (excision)   Orthostasis    Palpitations    PONV (postoperative nausea and vomiting)    Prediabetes    Rapid heart rate    Renal calculus, left    Seasonal allergies    Shortness of breath    Sleep apnea    Sleep disorder, unspecified    per excessive sleeping during the day   Snoring    Suicide ideation    Swallowing difficulty    Tachycardia    Vertigo    Vitamin D deficiency    Weakness    Wears contact lenses     Past Surgical History   Past Surgical History:  Procedure Laterality Date   ANTERIOR CERVICAL DECOMP/DISCECTOMY FUSION  02/17/2012   Procedure: ANTERIOR CERVICAL DECOMPRESSION/DISCECTOMY FUSION 2 LEVELS;  Surgeon: Hosie Spangle, MD;  Location: [facility];  Service: Neurosurgery;  Laterality: N/A;  Cervical  four-five and Cervical six-seven anterior cervial decompression with fusion plating and bonegraft   ANTERIOR CERVICAL DECOMP/DISCECTOMY FUSION  04-01-2001   _0    C5---6   BIOPSY  07/20/2018   Procedure: BIOPSY;  Surgeon: Daneil Dolin, MD;  Location: [facility];  Service: Endoscopy;;  gastric    BREAST REDUCTION SURGERY Bilateral 1995   [place]  x2   last one 1998   with BILATERAL TUBAL LIGATION  CESAREAN SECTION WITH BILATERAL TUBAL LIGATION     COLONOSCOPY WITH PROPOFOL N/A 07/20/2018   Dr. Gala Romney: Diverticulosis, internal hemorrhoids, 5 mm splenic flexure tubular adenoma removed. Repeat in 5 years due to family history.   COLONOSCOPY WITH PROPOFOL N/A 12/07/2020   Procedure: COLONOSCOPY WITH PROPOFOL;  Surgeon: Daneil Dolin, MD;  Location: [facility];  Service: Endoscopy;  Laterality: N/A;  2:30pm   CYSTOSCOPY/URETEROSCOPY/HOLMIUM LASER/STENT PLACEMENT Left 08/21/2018   Procedure: CYSTOSCOPY LEFT RETROGRADE PYELOGRAM /URETEROSCOPY/HOLMIUM LASER/STENT PLACEMENT;  Surgeon: Ceasar Mons, MD;  Location: [facility];  Service: Urology;  Laterality: Left;   DILATION AND CURETTAGE OF UTERUS  1992   for miscarriage   ENDOMETRIAL ABLATION  2014   ESOPHAGOGASTRODUODENOSCOPY     20 years ago.    ESOPHAGOGASTRODUODENOSCOPY (EGD) WITH PROPOFOL N/A 07/20/2018   Dr. Gala Romney: Small hiatal hernia, erythematous mucosa in the stomach with a healing gastric ulcer measuring 1 cm, biopsies negative.   ESOPHAGOGASTRODUODENOSCOPY (EGD) WITH PROPOFOL N/A 10/05/2020   Procedure: ESOPHAGOGASTRODUODENOSCOPY (EGD) WITH PROPOFOL;  Surgeon: Daneil Dolin, MD;  [facility];  Service: Endoscopy;  Laterality: N/A;   MALONEY DILATION N/A 10/05/2020   Procedure: Venia Minks DILATION;  Surgeon: Daneil Dolin, MD;  [facility]: [facility];  Service: Endoscopy;  Laterality: N/A;   [place]   unilateral for infertility   NASAL SINUS SURGERY  2010  approx.    POLYPECTOMY  07/20/2018   Procedure: POLYPECTOMY;  Surgeon: Daneil Dolin, MD;  [facility];  Service: Endoscopy;;   POSTERIOR CERVICAL FUSION/FORAMINOTOMY N/A 08/18/2013   Procedure: CERVICAL FOUR TO CERVICAL SEVEN POSTERIOR CERVICAL FUSION/FORAMINOTOMY LEVEL 3;  Surgeon: Hosie Spangle, MD;  Location: [facility];  Service: Neurosurgery;  Laterality: N/A;  C4-7 posterior cervical fusion with lateral mass fixation   RIGHT/LEFT HEART CATH AND CORONARY ANGIOGRAPHY N/A 12/23/2019   Procedure: RIGHT/LEFT HEART CATH AND CORONARY ANGIOGRAPHY;  Surgeon: Burnell Blanks, MD;  Location: [facility];  Service: Cardiovascular;  Laterality: N/A;    Past Family History   Family History  Problem Relation Age of Onset   Asthma Mother    Rheum arthritis Mother    Non-Hodgkin's lymphoma Mother    Congestive Heart Failure Mother    Atrial fibrillation Mother    Diabetes Mother    Hyperlipidemia Mother    Cancer Mother    Colon cancer Father    Multiple myeloma Father    Hypertension Father    Cancer Father    Depression Father    Anxiety disorder Father    Alcoholism Father    Obesity Father    Allergies Other        entire family(mom and dad)   Diabetes Maternal Grandmother    Congestive Heart Failure Maternal Grandmother    Mental illness Paternal Grandmother    Colon cancer Paternal Grandfather    Bone cancer Paternal Grandfather     Past Social History   Social History   Socioeconomic History   Marital status: Married    Spouse name: Not on file   Number of children: 2   Years of education: Not on file   Highest education level: Not on file  Occupational History   Occupation: disability  Tobacco Use   Smoking status: Former    Packs/day: 1.00    Years: 5.00    Total pack years: 5.00    Types: Cigarettes    Quit date: 02/09/2002    Years  since quitting: 19.8    Passive exposure: Past   Smokeless tobacco: Never  Vaping Use   Vaping Use:  Never used  Substance and Sexual Activity   Alcohol use: No   Drug use: No   Sexual activity: Yes    Partners: Male    Birth control/protection: Surgical    Comment: spouse  Other Topics Concern   Not on file  Social History Narrative   Step brother-2   Step sister-2   Half sister -1 healthy   Social Determinants of Health   Financial Resource Strain: Low Risk  (07/10/2018)   Overall Financial Resource Strain (CARDIA)    Difficulty of Paying Living Expenses: Not very hard  Food Insecurity: No Food Insecurity (07/10/2018)   Hunger Vital Sign    Worried About Running Out of Food in the Last Year: Never true    Ran Out of Food in the Last Year: Never true  Transportation Needs: No Transportation Needs (07/10/2018)   PRAPARE - Hydrologist (Medical): No    Lack of Transportation (Non-Medical): No  Physical Activity: Unknown (07/10/2018)   Exercise Vital Sign    Days of Exercise per Week: Patient refused    Minutes of Exercise per Session: Patient refused  Stress: Stress Concern Present (07/10/2018)   [place]    Feeling of Stress : Rather much  Social Connections: Unknown (07/10/2018)   Social Connection and Isolation Panel [NHANES]    Frequency of Communication with Friends and Family: Patient refused    Frequency of Social Gatherings with Friends and Family: Patient refused    Attends Religious Services: Patient refused    Active Member of Clubs or Organizations: Patient refused    Attends Archivist Meetings: Patient refused    Marital Status: Patient refused  Intimate Partner Violence: Not At Risk (07/10/2018)   Humiliation, Afraid, Rape, and Kick questionnaire    Fear of Current or Ex-Partner: No    Emotionally Abused: No    Physically Abused: No    Sexually Abused: No    Review of Systems   General: Negative for anorexia, weight loss, fever, chills, fatigue,  weakness. ENT: Negative for hoarseness, difficulty swallowing , nasal congestion. CV: Negative for chest pain, angina, palpitations, dyspnea on exertion, peripheral edema.  Respiratory: Negative for dyspnea at rest, dyspnea on exertion, cough, sputum, wheezing.  GI: See history of present illness. GU:  Negative for dysuria, hematuria, urinary incontinence, urinary frequency, nocturnal urination.  Endo: Negative for unusual weight change.     Physical Exam   There were no vitals taken for this visit.   General: Well-nourished, well-developed in no acute distress.  Eyes: No icterus. Mouth: Oropharyngeal mucosa moist and pink , no lesions erythema or exudate. Lungs: Clear to auscultation bilaterally.  Heart: Regular rate and rhythm, no murmurs rubs or gallops.  Abdomen: Bowel sounds are normal, nontender, nondistended, no hepatosplenomegaly or masses,  no abdominal bruits or hernia , no rebound or guarding.  Rectal: ***  Extremities: No lower extremity edema. No clubbing or deformities. Neuro: Alert and oriented x 4   Skin: Warm and dry, no jaundice.   Psych: Alert and cooperative, normal mood and affect.  Labs   *** Imaging Studies   CT ABDOMEN PELVIS WO CONTRAST  Result Date: 12/18/2021 CLINICAL DATA:  Worsening abdominal pain. EXAM: CT ABDOMEN AND PELVIS WITHOUT CONTRAST TECHNIQUE: Multidetector CT imaging of the abdomen and pelvis was performed following  the standard protocol without IV contrast. RADIATION DOSE REDUCTION: This exam was performed according to the departmental dose-optimization program which includes automated exposure control, adjustment of the mA and/or kV according to patient size and/or use of iterative reconstruction technique. COMPARISON:  Jul 10, 2021 FINDINGS: Lower chest: Mild atelectasis is seen within the posterior aspects of the bilateral lung bases. Hepatobiliary: No focal liver abnormality is seen. No gallstones, gallbladder wall thickening, or biliary  dilatation. Pancreas: Unremarkable. No pancreatic ductal dilatation or surrounding inflammatory changes. Spleen: Normal in size without focal abnormality. Adrenals/Urinary Tract: Adrenal glands are unremarkable. Kidneys are normal, without obstructing renal calculi, focal lesion, or hydronephrosis. 2 mm nonobstructing renal calculi are seen within the upper pole and lower pole of the right kidney. Bladder is unremarkable. Stomach/Bowel: Stomach is within normal limits. Appendix appears normal. Stool is seen throughout the large bowel. No evidence of bowel dilatation. Numerous diverticula are seen throughout the large bowel with mildly inflamed diverticula is seen within the mid sigmoid colon (coronal reformatted images 22 through 27, CT series 5). There is no evidence of associated perforation or abscess. Vascular/Lymphatic: No significant vascular findings are present. No enlarged abdominal or pelvic lymph nodes. Reproductive: The uterine fundus is enlarged and lobulated in appearance. The bilateral adnexa are unremarkable. Other: No abdominal wall hernia or abnormality. No abdominopelvic ascites. Musculoskeletal: No acute or significant osseous findings. IMPRESSION: 1. Mild sigmoid diverticulitis. 2. 2 mm nonobstructing right renal calculi. 3. Findings likely consistent with a fibroid uterus. Correlation with nonemergent pelvic ultrasound is recommended. Electronically Signed   By: Virgina Norfolk M.D.   On: 12/18/2021 01:38    Assessment       PLAN   ***   Laureen Ochs. Bobby Rumpf, [place], [facility]

## 2021-12-25 ENCOUNTER — Other Ambulatory Visit (HOSPITAL_COMMUNITY)
Admission: RE | Admit: 2021-12-25 | Discharge: 2021-12-25 | Disposition: A | Discharge status: Home / Self Care | Payer: 59 | Source: Ambulatory Visit | Attending: Gastroenterology | Admitting: Gastroenterology

## 2021-12-25 ENCOUNTER — Encounter (HOSPITAL_COMMUNITY): Payer: Self-pay | Admitting: Emergency Medicine

## 2021-12-25 ENCOUNTER — Encounter: Payer: Self-pay | Admitting: Gastroenterology

## 2021-12-25 ENCOUNTER — Emergency Department (HOSPITAL_COMMUNITY)
Admission: EM | Admit: 2021-12-25 | Discharge: 2021-12-25 | Disposition: A | Discharge status: Home / Self Care | Payer: 59 | Attending: Emergency Medicine | Admitting: Emergency Medicine

## 2021-12-25 ENCOUNTER — Other Ambulatory Visit: Payer: Self-pay

## 2021-12-25 ENCOUNTER — Ambulatory Visit (INDEPENDENT_AMBULATORY_CARE_PROVIDER_SITE_OTHER): Payer: 59 | Admitting: Gastroenterology

## 2021-12-25 VITALS — BP 97/66 | HR 79 | Temp 98.1°F | Ht 67.0 in | Wt 199.4 lb

## 2021-12-25 DIAGNOSIS — Z5321 Procedure and treatment not carried out due to patient leaving prior to being seen by health care provider: Secondary | ICD-10-CM | POA: Insufficient documentation

## 2021-12-25 DIAGNOSIS — R109 Unspecified abdominal pain: Secondary | ICD-10-CM | POA: Insufficient documentation

## 2021-12-25 DIAGNOSIS — K5732 Diverticulitis of large intestine without perforation or abscess without bleeding: Secondary | ICD-10-CM | POA: Insufficient documentation

## 2021-12-25 DIAGNOSIS — K921 Melena: Secondary | ICD-10-CM | POA: Diagnosis not present

## 2021-12-25 DIAGNOSIS — R110 Nausea: Secondary | ICD-10-CM | POA: Diagnosis not present

## 2021-12-25 DIAGNOSIS — R1030 Lower abdominal pain, unspecified: Secondary | ICD-10-CM | POA: Insufficient documentation

## 2021-12-25 HISTORY — DX: Diverticulitis of intestine, part unspecified, without perforation or abscess without bleeding: K57.92

## 2021-12-25 LAB — LIPASE, BLOOD: Lipase: 34 U/L (ref 11–51)

## 2021-12-25 LAB — CBC WITH DIFFERENTIAL/PLATELET
Abs Immature Granulocytes: 0.1 10*3/uL — ABNORMAL HIGH (ref 0.00–0.07)
Basophils Absolute: 0.1 10*3/uL (ref 0.0–0.1)
Basophils Relative: 1 %
Eosinophils Absolute: 0.4 10*3/uL (ref 0.0–0.5)
Eosinophils Relative: 3 %
HCT: 37.9 % (ref 36.0–46.0)
Hemoglobin: 12.5 g/dL (ref 12.0–15.0)
Immature Granulocytes: 1 %
Lymphocytes Relative: 16 %
Lymphs Abs: 1.9 10*3/uL (ref 0.7–4.0)
MCH: 28.5 pg (ref 26.0–34.0)
MCHC: 33 g/dL (ref 30.0–36.0)
MCV: 86.5 fL (ref 80.0–100.0)
Monocytes Absolute: 0.9 10*3/uL (ref 0.1–1.0)
Monocytes Relative: 8 %
Neutro Abs: 8.5 10*3/uL — ABNORMAL HIGH (ref 1.7–7.7)
Neutrophils Relative %: 71 %
Platelets: 458 10*3/uL — ABNORMAL HIGH (ref 150–400)
RBC: 4.38 MIL/uL (ref 3.87–5.11)
RDW: 13.7 % (ref 11.5–15.5)
WBC: 12 10*3/uL — ABNORMAL HIGH (ref 4.0–10.5)
nRBC: 0 % (ref 0.0–0.2)

## 2021-12-25 LAB — COMPREHENSIVE METABOLIC PANEL
ALT: 23 U/L (ref 0–44)
AST: 21 U/L (ref 15–41)
Albumin: 4.2 g/dL (ref 3.5–5.0)
Alkaline Phosphatase: 107 U/L (ref 38–126)
Anion gap: 8 (ref 5–15)
BUN: 18 mg/dL (ref 6–20)
CO2: 25 mmol/L (ref 22–32)
Calcium: 9 mg/dL (ref 8.9–10.3)
Chloride: 109 mmol/L (ref 98–111)
Creatinine, Ser: 1.04 mg/dL — ABNORMAL HIGH (ref 0.44–1.00)
GFR, Estimated: >60 mL/min (ref >60)
Glucose, Bld: 105 mg/dL — ABNORMAL HIGH (ref 70–99)
Potassium: 3.6 mmol/L (ref 3.5–5.1)
Sodium: 142 mmol/L (ref 135–145)
Total Bilirubin: 0.2 mg/dL — ABNORMAL LOW (ref 0.3–1.2)
Total Protein: 6.9 g/dL (ref 6.5–8.1)

## 2021-12-25 MED ORDER — HYDROCODONE-ACETAMINOPHEN 5-325 MG PO TABS: 1.0000 | ORAL_TABLET | Freq: Four times a day (QID) | ORAL | 0 refills | Status: DC | PRN

## 2021-12-25 MED ORDER — CIPROFLOXACIN HCL 500 MG PO TABS: 500.0000 mg | ORAL_TABLET | Freq: Two times a day (BID) | ORAL | 0 refills | Status: AC

## 2021-12-25 MED ORDER — PROMETHAZINE HCL 12.5 MG PO TABS: 12.5000 mg | ORAL_TABLET | Freq: Four times a day (QID) | ORAL | 0 refills | Status: DC | PRN

## 2021-12-25 MED ORDER — METRONIDAZOLE 500 MG PO TABS: 500.0000 mg | ORAL_TABLET | Freq: Three times a day (TID) | ORAL | 0 refills | Status: AC

## 2021-12-25 NOTE — ED Triage Notes (Signed)
Pt just finished abx for diverticulitis. States still hurting to lower abd. Seen gi today and sent for labs. C/o n/black stool and dark urine. Pt ambulatory. Nad at this time

## 2021-12-25 NOTE — Patient Instructions (Signed)
  Go to [facility] lab for blood work. We will call you when results are available. Based on labs you may require hospitalization.

## 2021-12-25 NOTE — ED Notes (Signed)
Called, no answer.

## 2021-12-28 ENCOUNTER — Ambulatory Visit: Payer: Medicare Other | Admitting: Physician Assistant

## 2022-01-01 ENCOUNTER — Ambulatory Visit: Payer: 59 | Admitting: Physician Assistant

## 2022-01-01 ENCOUNTER — Other Ambulatory Visit: Payer: Self-pay | Admitting: Physician Assistant

## 2022-01-01 ENCOUNTER — Telehealth: Payer: Self-pay | Admitting: Gastroenterology

## 2022-01-01 NOTE — Telephone Encounter (Signed)
No, she needs to keep the appt with Gen. Surgery

## 2022-01-01 NOTE — Telephone Encounter (Signed)
Pt was just seen last week and also has an appt next week 11/28 with Dr. Gala Romney.  She has another appt somewhere else at the same time as here.  Does she need to be seen again here?

## 2022-01-08 ENCOUNTER — Ambulatory Visit: Payer: 59 | Admitting: General Surgery

## 2022-01-08 ENCOUNTER — Ambulatory Visit: Payer: 59 | Admitting: Internal Medicine

## 2022-01-16 ENCOUNTER — Telehealth: Payer: 59 | Admitting: Physician Assistant

## 2022-01-21 ENCOUNTER — Other Ambulatory Visit: Payer: Self-pay | Admitting: Physician Assistant

## 2022-02-18 ENCOUNTER — Telehealth: Payer: 59 | Admitting: Physician Assistant

## 2022-02-18 ENCOUNTER — Encounter: Payer: Self-pay | Admitting: Physician Assistant

## 2022-02-18 ENCOUNTER — Ambulatory Visit (INDEPENDENT_AMBULATORY_CARE_PROVIDER_SITE_OTHER): Payer: Medicare Other | Admitting: Physician Assistant

## 2022-02-18 DIAGNOSIS — G2581 Restless legs syndrome: Secondary | ICD-10-CM | POA: Diagnosis not present

## 2022-02-18 DIAGNOSIS — F5105 Insomnia due to other mental disorder: Secondary | ICD-10-CM | POA: Diagnosis not present

## 2022-02-18 DIAGNOSIS — R454 Irritability and anger: Secondary | ICD-10-CM

## 2022-02-18 DIAGNOSIS — F99 Mental disorder, not otherwise specified: Secondary | ICD-10-CM

## 2022-02-18 DIAGNOSIS — G4733 Obstructive sleep apnea (adult) (pediatric): Secondary | ICD-10-CM

## 2022-02-18 DIAGNOSIS — F319 Bipolar disorder, unspecified: Secondary | ICD-10-CM | POA: Diagnosis not present

## 2022-02-18 DIAGNOSIS — F411 Generalized anxiety disorder: Secondary | ICD-10-CM | POA: Diagnosis not present

## 2022-02-18 MED ORDER — CLONAZEPAM 1 MG PO TABS: ORAL_TABLET | ORAL | 5 refills | Status: DC

## 2022-02-18 MED ORDER — BUSPIRONE HCL 15 MG PO TABS: ORAL_TABLET | ORAL | 1 refills | Status: DC

## 2022-02-18 MED ORDER — OXCARBAZEPINE 300 MG PO TABS: 900.0000 mg | ORAL_TABLET | Freq: Every day | ORAL | 3 refills | Status: DC

## 2022-02-18 MED ORDER — BUPROPION HCL ER (XL) 150 MG PO TB24: 450.0000 mg | ORAL_TABLET | Freq: Every day | ORAL | 3 refills | Status: DC

## 2022-02-18 NOTE — Progress Notes (Signed)
[facility] Med Check  Patient ID: Kristen [name],  MRN: [id number]  PCP: Barrie Lyme  Date of Evaluation: 02/18/2022 Time spent:30 minutes  Chief Complaint:  Chief Complaint   Anxiety; Depression; Follow-up    HISTORY/CURRENT STATUS: HPI For routine med check.  Is irritable all the time. Frustrated b/c husband is home all the time, he's been out of work for 22 months. Has been in hospital recently to evaluate seizures. Hard time dealing with him. Patient denies increased energy with decreased need for sleep, increased talkativeness, racing thoughts, impulsivity or risky behaviors, increased spending, increased libido, grandiosity, paranoia, or hallucinations.  Doesn't enjoy much of anything.  Energy and motivation are good.  Work is going well, works about 20 hours a week. That is her escape.  No extreme sadness, tearfulness, or feelings of hopelessness.  Sleeps fairly well w/ Trazodone and Klonopin.   ADLs and personal hygiene are normal.   Denies any changes in concentration, making decisions, or remembering things.  Appetite has not changed.  Weight is stable.  Anxiety is worse right now. No PA, gets overwhelmed with everything going on. Denies suicidal or homicidal thoughts.  Denies dizziness, syncope, seizures, numbness, tingling, tremor, tics, unsteady gait, slurred speech, confusion. Denies muscle or joint pain, stiffness, or dystonia.  Individual Medical History/ Review of Systems: Changes? :Yes    MRSA of right thumb on antibiotics now. Had diverticulitis in Nov.   Past medications for mental health diagnoses include: Prozac, Paxil, Lexapro, Celexa, Zoloft, Viibryd, Effexor, Cymbalta, Risperdal, Latuda, Lamictal, VPA, Lithium-became toxic, Xanax, Wellbutrin, Abilify, Willette [place], Strattera, Kapvay, Saphris, Adderall, Vyvanse, Lunesta, Sonata,  Vraylar, Seroquel, Geodon, Tegretal  Allergies: Iohexol, Rosuvastatin, and Diatrizoate  Current Medications:  Current  Outpatient Medications:    albuterol (PROVENTIL) (2.5 MG/3ML) 0.083% nebulizer solution, USE 1 VIAL IN NEBULIZER EVERY 4 HOURS AS NEEDED FOR WHEEZING, Disp: , Rfl:    albuterol (VENTOLIN HFA) 108 (90 Base) MCG/ACT inhaler, Inhale 2 puffs into the lungs every 6 (six) hours as needed for wheezing or shortness of breath., Disp: 18 g, Rfl: 1   aspirin EC 81 MG tablet, Take 1 tablet (81 mg total) by mouth daily. Swallow whole., Disp: 30 tablet, Rfl: 11   busPIRone (BUSPAR) 15 MG tablet, 1/3 tablet twice daily for 1 week, then increase to 2/3 tablet twice daily for 1 week, then increase to 1 tablet twice daily for anxiety., Disp: 60 tablet, Rfl: 1   cetirizine (ZYRTEC) 10 MG tablet, Take 10 mg by mouth daily., Disp: , Rfl:    hydrocortisone (ANUSOL-HC) 2.5 % rectal cream, Place 1 application rectally 2-4 times daily for rectal bleeding/burning related to hemorrhoids., Disp: 30 g, Rfl: 1   hydrOXYzine (ATARAX) 25 MG tablet, TAKE 1 TO 2 TABLETS BY MOUTH EVERY 6 HOURS AS NEEDED, Disp: 180 tablet, Rfl: 0   lamoTRIgine (LAMICTAL) 200 MG tablet, TAKE 2 & 1/2 (TWO & ONE-HALF) TABLETS BY MOUTH ONCE DAILY, Disp: 225 tablet, Rfl: 3   loperamide (IMODIUM A-D) 2 MG tablet, Take 1 tablet (2 mg total) by mouth 4 (four) times daily as needed for diarrhea or loose stools., Disp: 40 tablet, Rfl: 0   meclizine (ANTIVERT) 25 MG tablet, Take 25 mg by mouth 3 (three) times daily as needed for dizziness., Disp: , Rfl:    metFORMIN (GLUCOPHAGE-XR) 750 MG 24 hr tablet, Take 750 mg by mouth daily., Disp: , Rfl:    ondansetron (ZOFRAN-ODT) 8 MG disintegrating tablet, Take 1 tablet (8 mg total) by mouth 2 (  two) times daily as needed for nausea or vomiting., Disp: 60 tablet, Rfl: 5   pantoprazole (PROTONIX) 40 MG tablet, TAKE 1 TABLET BY MOUTH TWICE DAILY WITH A MEAL, Disp: 180 tablet, Rfl: 2   psyllium (METAMUCIL) 58.6 % packet, Take 1 packet by mouth daily., Disp: , Rfl:    rosuvastatin (CRESTOR) 5 MG tablet, Take 1 tablet (5 mg  total) by mouth daily., Disp: 90 tablet, Rfl: 3   Semaglutide, 1 MG/DOSE, 4 MG/3ML SOPN, Inject 1 mg into the skin once a week., Disp: 3 mL, Rfl: 11   sertraline (ZOLOFT) 100 MG tablet, TAKE 3 TABLETS BY MOUTH AT BEDTIME, Disp: 90 tablet, Rfl: 11   acetaminophen (TYLENOL) 500 MG tablet, Take 1,000 mg by mouth every 6 (six) hours as needed for moderate pain., Disp: , Rfl:    buPROPion (WELLBUTRIN XL) 150 MG 24 hr tablet, Take 3 tablets (450 mg total) by mouth daily., Disp: 270 tablet, Rfl: 3   clonazePAM (KLONOPIN) 1 MG tablet, 1 p.o. 3 times daily as needed anxiety and may repeat 1/2 pill during the middle of the day as needed anxiety.  No more than 3.5 mg/day., Disp: 105 tablet, Rfl: 5   HYDROcodone-acetaminophen (NORCO/VICODIN) 5-325 MG tablet, Take 1 tablet by mouth every 6 (six) hours as needed for moderate pain. (Patient not taking: Reported on 02/18/2022), Disp: 12 tablet, Rfl: 0   montelukast (SINGULAIR) 10 MG tablet, Take 10 mg by mouth daily.  (Patient not taking: Reported on 02/18/2022), Disp: , Rfl:    Oxcarbazepine (TRILEPTAL) 300 MG tablet, Take 3 tablets (900 mg total) by mouth at bedtime., Disp: 270 tablet, Rfl: 3   promethazine (PHENERGAN) 12.5 MG tablet, Take 1 tablet (12.5 mg total) by mouth every 6 (six) hours as needed for nausea or vomiting. (Patient not taking: Reported on 02/18/2022), Disp: 30 tablet, Rfl: 0 Medication Side Effects:  Sweating  Family Medical/ Social History: Changes? Husband still out of work on disability b/c seizures. See HPI.Son, DIL and granddaughter moved from [place] to [place]. Happy about that.   MENTAL HEALTH EXAM:  There were no vitals taken for this visit.There is no height or weight on file to calculate BMI.  General Appearance: Casual, Neat, Well Groomed and Obese  Eye Contact:  Fair  Speech:  Clear and Coherent and Normal Rate  Volume:  Normal  Mood:  Irritable  Affect:  Congruent  Thought Process:  Goal Directed and Descriptions of Associations:  Circumstantial  Orientation:  Full (Time, Place, and Person)  Thought Content: Logical   Suicidal Thoughts:  No  Homicidal Thoughts:  No  Memory:  WNL  Judgement:  Good  Insight:  Good  Psychomotor Activity:  Normal  Concentration:  Concentration: Good and Attention Span: Good  Recall:  Good  Fund of Knowledge: Good  Language: Good  Assets:  Desire for Improvement  ADL's:  Intact  Cognition: WNL  Prognosis:  Good   Labs from 12/17/2021 and 12/25/2021 reviewed.   Gene site test results are under labs dated 04/03/2021.  DIAGNOSES:    ICD-10-CM   1. Generalized anxiety disorder  F41.1     2. Bipolar I disorder ([place])  F31.9     3. Restless legs  G25.81     4. Insomnia due to other mental disorder  F51.05    F99     5. Obstructive sleep apnea  G47.33     6. Irritability and anger  R45.4       Receiving Psychotherapy: Yes  Felix Ahmadi  RECOMMENDATIONS:  PDMP was reviewed.  Last Klonopin filled 02/15/2022. I provided 30 minutes of face to face time during this encounter, including time spent before and after the visit in records review, medical decision making, counseling pertinent to today's visit, and charting.   Discussed the increased anxiety. Recommend adding Buspar. Benefits risks and SE disc and she accepts.  Continue Wellbutrin XL 150 mg, 3 p.o. every morning. Start BuSpar 15 mg 1/3 tablet twice daily for 1 week, then increase to 2/3 tablet twice daily for 1 week, then increase to 1 tablet twice daily for anxiety. Continue Klonopin 1 mg 1 p.o. 3 times daily as needed, with occasional extra one half p.o. midday as needed. Continue hydroxyzine 25 mg, 1 p.o. 3 times daily as needed. Continue Lamictal 200 mg, 2.5 pills daily. Continue Trileptal 300 mg, 3 nightly. Continue Zoloft 100 mg, 3 p.o. daily. Continue counseling with Felix Ahmadi.  Return in 6 weeks.   [name], PA-C

## 2022-02-19 ENCOUNTER — Telehealth: Payer: Self-pay

## 2022-02-19 NOTE — Telephone Encounter (Signed)
I already addressed. She would best be served with an office visit, with Dr. Gala Romney as previously planned. Her visit with him was cancelled in 12/2021 because she was seen earlier due to acute symptoms.

## 2022-02-19 NOTE — Telephone Encounter (Signed)
Offered pt an ov and she refused, stated that she had an appt with her pcp next week that she would address it with him.

## 2022-02-19 NOTE — Telephone Encounter (Signed)
Pt called and left a vm regarding her myChart messages that she sent to you. Pt stated that they were not meant to be FYI's and is wanting to know your recommendations.

## 2022-02-20 NOTE — Telephone Encounter (Signed)
noted 

## 2022-02-22 ENCOUNTER — Ambulatory Visit: Payer: Medicare Other | Admitting: Internal Medicine

## 2022-03-11 ENCOUNTER — Telehealth: Payer: Self-pay | Admitting: Gastroenterology

## 2022-03-11 NOTE — Telephone Encounter (Signed)
Good morning Dr. Havery Moros,   Supervising Provider 03/11/22   We received a referral for this patient for Diverticulitis. Patient stated her mother passed from complications with diverticulitis and would like to proceed care with [facility]. Patient has history with [facility], she feels as if she is not getting the proper patient care there and wants to establish care with you. Records are in Epic for your review. Please advise on scheduling.   Thank you.

## 2022-03-11 NOTE — Telephone Encounter (Signed)
Hello. I am supervising the clinic this AM, but not the doc of the day who handles these requests. This request should go to the doc of the day if you can route to whomever that is listed for today. Thanks

## 2022-03-11 NOTE — Telephone Encounter (Signed)
Thank you I did not realize this was now the role of supervising doc. I am happy to see this patient, can book with me or APP in the office. Thanks

## 2022-03-13 ENCOUNTER — Encounter: Payer: Self-pay | Admitting: Pharmacist

## 2022-03-13 ENCOUNTER — Ambulatory Visit: Payer: 59 | Admitting: Gastroenterology

## 2022-03-13 ENCOUNTER — Encounter: Payer: Self-pay | Admitting: Gastroenterology

## 2022-03-13 VITALS — BP 116/76 | HR 75 | Ht 66.0 in | Wt 189.0 lb

## 2022-03-13 DIAGNOSIS — R110 Nausea: Secondary | ICD-10-CM | POA: Diagnosis not present

## 2022-03-13 DIAGNOSIS — R140 Abdominal distension (gaseous): Secondary | ICD-10-CM

## 2022-03-13 DIAGNOSIS — K5909 Other constipation: Secondary | ICD-10-CM

## 2022-03-13 DIAGNOSIS — K5732 Diverticulitis of large intestine without perforation or abscess without bleeding: Secondary | ICD-10-CM

## 2022-03-13 MED ORDER — IBSRELA 50 MG PO TABS: 50.0000 mg | ORAL_TABLET | Freq: Two times a day (BID) | ORAL | 0 refills | Status: DC

## 2022-03-13 MED ORDER — IBGARD 90 MG PO CPCR: ORAL_CAPSULE | ORAL | 0 refills | Status: DC

## 2022-03-13 NOTE — Patient Instructions (Addendum)
If you are age 52 or older, your body mass index should be between 23-30. Your Body mass index is 30.51 kg/m. If this is out of the aforementioned range listed, please consider follow up with your Primary Care Provider.  If you are age 19 or younger, your body mass index should be between 19-25. Your Body mass index is 30.51 kg/m. If this is out of the aformentioned range listed, please consider follow up with your Primary Care Provider.   ________________________________________________________   We are referring you to [facility].  They will contact you directly to schedule an appointment.  It may take a week or more before you hear from them.  Please feel free to contact us if you have not heard from them within 2 weeks and we will follow up on the referral.   We have given you samples of the following medication to take: IBSRELA 50 mg: Take twice a day (hold your Miralax)  IBGard: Take as directed (you can buy over-the-counter if you like it)  Discuss holding your Ozempic with your PCP.  Continue Zofran  Thank you for entrusting me with your care and for choosing [facility], Dr. [name] Kunia Cellar

## 2022-03-13 NOTE — Progress Notes (Unsigned)
Virtual Visit via Video Note   This visit type was conducted due to national recommendations for restrictions regarding the COVID-19 Pandemic (e.g. social distancing) in an effort to limit this patient's exposure and mitigate transmission in our community.  Due to her co-morbid illnesses, this patient is at least at moderate risk for complications without adequate follow up.  This format is felt to be most appropriate for this patient at this time.  All issues noted in this document were discussed and addressed.  A limited physical exam was performed with this format.  Please refer to the patient's chart for her consent to telehealth for [facility].  Date:  03/13/2022   ID:  LANY DEVAUL, DOB 02-24-1970, MRN [id number] The patient was identified using 2 identifiers.  Patient Location: Home Provider Location: Office/Clinic   PCP:  Exie Parody   [facility] Providers Cardiologist:  None     Evaluation Performed:  Follow-Up Visit  Chief Complaint:  OSA  History of Present Illness:    Kristen [name] is a 52 y.o. female with a hx of bipolar disorder, depression, anxiety, GERD, fatty liver, snoring who was referred for sleep study for excessive daytime sleepiness with an Epworth sleepiness score 13.  She also was having problems waking up from snoring.  She was found to have mild obstructive sleep apnea with an AHI of 8.5/h and mild oxygen desaturations as low as 84%.  There was loud snoring noted.  She underwent CPAP titration to 11 cm H2O and is now here for follow-up.  She is doing well with her PAP device and thinks that she has gotten used to it.  She tolerates the mask and feels the pressure is adequate.  Since going on PAP she feels rested in the am and has no significant daytime sleepiness.  She denies any significant mouth or nasal dryness or nasal congestion.  SHe does not think that he snores.   Past Medical History:  Diagnosis Date   Allergies    Anemia     Anxiety    Arthritis    Atypical mole 11/03/2006   mid upper back (slight to moderate)   Atypical mole 11/03/2006   lower right back (slight to moderate)   Back pain    Bipolar 1 disorder (HCC)    Borderline diabetic    Chronic constipation    Depression    Diabetes (HCC)    Diverticulitis    Family history of adverse reaction to anesthesia    mother-- ponv   Fatty liver    GAD (generalized anxiety disorder)    GERD (gastroesophageal reflux disease)    Hiatal hernia    High triglycerides    History of kidney stones    History of recurrent UTIs    History of sepsis 06/2018   History of stomach ulcers    History of suicidal ideation    Hyperlipidemia    Hypothyroidism    IDA (iron deficiency anemia)    Joint pain    Melanoma (HCC) 11/03/2006   left chest in situ (excision)   Orthostasis    Palpitations    PONV (postoperative nausea and vomiting)    Rapid heart rate    Renal calculus, left    Seasonal allergies    Shortness of breath    Sleep disorder, unspecified    per excessive sleeping during the day   Snoring    Suicide ideation    Swallowing difficulty    Tachycardia  Vertigo    Vitamin D deficiency    Weakness    Wears contact lenses    Past Surgical History:  Procedure Laterality Date   ANTERIOR CERVICAL DECOMP/DISCECTOMY FUSION  02/17/2012   Procedure: ANTERIOR CERVICAL DECOMPRESSION/DISCECTOMY FUSION 2 LEVELS;  Surgeon: Hewitt Shorts, MD;  Location: [facility];  Service: Neurosurgery;  Laterality: N/A;  Cervical four-five and Cervical six-seven anterior cervial decompression with fusion plating and bonegraft   ANTERIOR CERVICAL DECOMP/DISCECTOMY FUSION  04-01-2001   @[facility]    C5---6   BIOPSY  07/20/2018   Procedure: BIOPSY;  Surgeon: Corbin Ade, MD;  Location: [facility];  Service: Endoscopy;;  gastric    BREAST REDUCTION SURGERY Bilateral 1995   CESAREAN SECTION  x2   last one 1998   with BILATERAL TUBAL LIGATION   CESAREAN SECTION  WITH BILATERAL TUBAL LIGATION     COLONOSCOPY WITH PROPOFOL N/A 07/20/2018   Dr. Jena Gauss: Diverticulosis, internal hemorrhoids, 5 mm splenic flexure tubular adenoma removed. Repeat in 5 years due to family history.   COLONOSCOPY WITH PROPOFOL N/A 12/07/2020   Procedure: COLONOSCOPY WITH PROPOFOL;  Surgeon: Corbin Ade, MD;  Location: [facility];  Service: Endoscopy;  Laterality: N/A;  2:30pm   CYSTOSCOPY/URETEROSCOPY/HOLMIUM LASER/STENT PLACEMENT Left 08/21/2018   Procedure: CYSTOSCOPY LEFT RETROGRADE PYELOGRAM /URETEROSCOPY/HOLMIUM LASER/STENT PLACEMENT;  Surgeon: Rene Paci, MD;  Location: [facility];  Service: Urology;  Laterality: Left;   DILATION AND CURETTAGE OF UTERUS  1992   for miscarriage   ENDOMETRIAL ABLATION  2014   ESOPHAGOGASTRODUODENOSCOPY     20 years ago.    ESOPHAGOGASTRODUODENOSCOPY (EGD) WITH PROPOFOL N/A 07/20/2018   Dr. Jena Gauss: Small hiatal hernia, erythematous mucosa in the stomach with a healing gastric ulcer measuring 1 cm, biopsies negative.   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   POLYPECTOMY  07/20/2018   Procedure: POLYPECTOMY;  Surgeon: Corbin Ade, MD;  Location: [facility];  Service: Endoscopy;;   POSTERIOR CERVICAL FUSION/FORAMINOTOMY N/A 08/18/2013   Procedure: CERVICAL FOUR TO CERVICAL SEVEN POSTERIOR CERVICAL FUSION/FORAMINOTOMY LEVEL 3;  Surgeon: Hewitt Shorts, MD;  Location: [facility];  Service: Neurosurgery;  Laterality: N/A;  C4-7 posterior cervical fusion with lateral mass  fixation   RIGHT/LEFT HEART CATH AND CORONARY ANGIOGRAPHY N/A 12/23/2019   Procedure: RIGHT/LEFT HEART CATH AND CORONARY ANGIOGRAPHY;  Surgeon: Kathleene Hazel, MD;  Location: [facility];  Service: Cardiovascular;  Laterality: N/A;     No outpatient medications have been marked as taking for the 03/14/22 encounter (Appointment) with Quintella Reichert, MD.     Allergies:   Iohexol, Rosuvastatin, and Diatrizoate   Social History   Tobacco Use   Smoking status: Former    Packs/day: 1.00    Years: 5.00    Total pack years: 5.00    Types: Cigarettes    Quit date: 02/09/2002    Years since quitting: 20.1    Passive exposure: Past   Smokeless tobacco: Never  Vaping Use   Vaping Use: Never used  Substance Use Topics   Alcohol  use: No   Drug use: No     Family Hx: The patient's family history includes Alcoholism in her father; Allergies in an other family member; Anxiety disorder in her father; Asthma in her mother; Atrial fibrillation in her mother; Bone cancer in her paternal grandfather; Cancer in her father and mother; Colon cancer in her father and paternal grandfather; Congestive Heart Failure in her maternal grandmother and mother; Depression in her father; Diabetes in her maternal grandmother and mother; Hyperlipidemia in her mother; Hypertension in her father; Mental illness in her paternal grandmother; Multiple myeloma in her father; Non-Hodgkin's lymphoma in her mother; Obesity in her father; Rheum arthritis in her mother. There is no history of Liver disease.  ROS:   Please see the history of present illness.     All other systems reviewed and are negative.   Prior CV studies:   The following studies were reviewed today:  Sleep study  and CPAP titration, PAP compliance download  Labs/Other Tests and Data Reviewed:    EKG:  No ECG reviewed.  Recent Labs: 12/25/2021: ALT 23; BUN 18; Creatinine, Ser 1.04; Hemoglobin 12.5; Platelets 458; Potassium 3.6;  Sodium 142   Recent Lipid Panel Lab Results  Component Value Date/Time   CHOL 181 05/12/2020 11:17 AM   TRIG 289 (H) 05/12/2020 11:17 AM   HDL 41 05/12/2020 11:17 AM   CHOLHDL 4.4 05/12/2020 11:17 AM   CHOLHDL 4.9 05/04/2008 06:06 AM   LDLCALC 92 05/12/2020 11:17 AM    Wt Readings from Last 3 Encounters:  03/13/22 189 lb (85.7 kg)  12/25/21 199 lb 6.4 oz (90.4 kg)  12/17/21 200 lb (90.7 kg)     Risk Assessment/Calculations:          Objective:    Vital Signs:  There were no vitals taken for this visit.  Well nourished, well developed female in no acute distress. Well appearing, alert and conversant, regular work of breathing,  good skin color  Eyes- anicteric mouth- oral mucosa is pink  neuro- grossly intact skin- no apparent rash or lesions or cyanosis ASSESSMENT & PLAN:    OSA - The patient is tolerating PAP therapy well without any problems. The PAP download performed by his DME was personally reviewed and interpreted by me today and showed an AHI of ***/hr on *** cm H2O with ***% compliance in using more than 4 hours nightly.  The patient has been using and benefiting from PAP use and will continue to benefit from therapy.    Time:   Today, I have spent 15 minutes with the patient with telehealth technology discussing the above problems.   Medication Adjustments/Labs and Tests Ordered: Current medicines are reviewed at length with the patient today.  Concerns regarding medicines are outlined above.   Tests Ordered: No orders of the defined types were placed in this encounter.    Medication Changes: No orders of the defined types were placed in this encounter.    Follow Up:  In Person in 3 month(s)  Signed, Armanda Magic, MD  03/13/2022 6:28 PM    [facility]

## 2022-03-13 NOTE — Progress Notes (Signed)
HPI :  52 y.o. female with a history of diverticulitis, GERD, chronic constipation, here to establish care for second opinion for her symptoms, this is the first time she has been seen in our office.  She was previously seen at [facility] gastroenterology.  We discussed her history of diverticulitis.  She has CT documented sigmoid diverticulitis 07/2020 and descending colon diverticulitis 04/2021, 12/2021, as well as duodenal diverticulitis 05/2021.  In total she thinks she has been treated for diverticulitis 6 or 7 times over the past 3 years.  She typically responds to antibiotics when she receives them although course has been complicated by C. difficile in the past from antibiotics.  Her typical pain from diverticulitis is lower left-sided pain, however she has had right-sided pain/lower abdominal pain in the past as well.    Complicating her course is that at baseline she has severe constipation.  She states if she does not take anything to move her bowels she will typically have 1 bowel movement per week.  In the past she has been on MiraLAX, most recently using this 2 caps twice daily.  She states this makes her feel gassy and bloated but does not really help with stool output.  Previously she was on Linzess and this did help her constipation however it stopped working overtime.  She has described using manual maneuvers to remove stool from her rectum in the past.  She does not need to do this frequently but has significant straining at times and has a hard time getting stool out.  However, when she has flareups of her pain or concern for diverticulitis she can have loose stool with this.  She endorses what she feels is a protrusion or lump in the right side of her mid abdomen that can protrude out, sometimes when she is bloated.  Her husband describes it as a softball in the right mid abdomen.  She endorses frequent bloating with diffuse discomfort in her abdomen when she is  constipated.  Her father had colon cancer at the age of 29, her aunt had it at age 32, paternal grandmother also had colon cancer.  Her last colonoscopy was in October 2022 and was normal other than diverticulosis.  No polyps.  She otherwise endorses nausea frequently but no vomiting.  She is eating okay without any early satiety.  She is using Zofran which can help but she is using it frequently.  She has been on Ozempic for several months, she continues to take this.  She has had a prior endoscopy in August 2022 which was normal.  She has had her esophagus dilated in the past due to dysphagia.  Of note she has had An abdominal ultrasound in 2022 showing no gallstones, and a HIDA scan in July of last year which was negative.  Last imaging: CT A/P with contrast 12/2021: IMPRESSION: 1. Mild sigmoid diverticulitis. 2. 2 mm nonobstructing right renal calculi. 3. Findings likely consistent with a fibroid uterus. Correlation with nonemergent pelvic ultrasound is recommended.  HIDA negative 08/23/21   Past Medical History:  Diagnosis Date   Allergies    Anemia    Anxiety    Arthritis    Atypical mole 11/03/2006   mid upper back (slight to moderate)   Atypical mole 11/03/2006   lower right back (slight to moderate)   Back pain    Bipolar 1 disorder (HCC)    Borderline diabetic    Chronic constipation    Depression  Diabetes ([place])    Diverticulitis    Family history of adverse reaction to anesthesia    mother-- ponv   Fatty liver    GAD (generalized anxiety disorder)    GERD (gastroesophageal reflux disease)    Hiatal hernia    High triglycerides    History of kidney stones    History of recurrent UTIs    History of sepsis 06/2018   History of stomach ulcers    History of suicidal ideation    Hyperlipidemia    Hypothyroidism    IDA (iron deficiency anemia)    Joint pain    Melanoma ([place]) 11/03/2006   left chest in situ (excision)   Orthostasis    Palpitations    PONV  (postoperative nausea and vomiting)    Rapid heart rate    Renal calculus, left    Seasonal allergies    Shortness of breath    Sleep disorder, unspecified    per excessive sleeping during the day   Snoring    Suicide ideation    Swallowing difficulty    Tachycardia    Vertigo    Vitamin D deficiency    Weakness    Wears contact lenses      Past Surgical History:  Procedure Laterality Date   ANTERIOR CERVICAL DECOMP/DISCECTOMY FUSION  02/17/2012   Procedure: ANTERIOR CERVICAL DECOMPRESSION/DISCECTOMY FUSION 2 LEVELS;  Surgeon: Hosie Spangle, MD;  Location: [facility];  Service: Neurosurgery;  Laterality: N/A;  Cervical four-five and Cervical six-seven anterior cervial decompression with fusion plating and bonegraft   ANTERIOR CERVICAL DECOMP/DISCECTOMY FUSION  04-01-2001   '@[facility]'$    C5---6   BIOPSY  07/20/2018   Procedure: BIOPSY;  Surgeon: Daneil Dolin, MD;  Location: [facility];  Service: Endoscopy;;  gastric    BREAST REDUCTION SURGERY Bilateral 1995   [place]  x2   last one 1998   with BILATERAL TUBAL LIGATION   CESAREAN SECTION WITH BILATERAL TUBAL LIGATION     COLONOSCOPY WITH PROPOFOL N/A 07/20/2018   Dr. Gala Romney: Diverticulosis, internal hemorrhoids, 5 mm splenic flexure tubular adenoma removed. Repeat in 5 years due to family history.   COLONOSCOPY WITH PROPOFOL N/A 12/07/2020   Procedure: COLONOSCOPY WITH PROPOFOL;  Surgeon: Daneil Dolin, MD;  Location: [facility];  Service: Endoscopy;  Laterality: N/A;  2:30pm   CYSTOSCOPY/URETEROSCOPY/HOLMIUM LASER/STENT PLACEMENT Left 08/21/2018   Procedure: CYSTOSCOPY LEFT RETROGRADE PYELOGRAM /URETEROSCOPY/HOLMIUM LASER/STENT PLACEMENT;  Surgeon: Ceasar Mons, MD;  Location: [facility];  Service: Urology;  Laterality: Left;   DILATION AND CURETTAGE OF UTERUS  1992   for miscarriage   ENDOMETRIAL ABLATION  2014   ESOPHAGOGASTRODUODENOSCOPY     20 years ago.     ESOPHAGOGASTRODUODENOSCOPY (EGD) WITH PROPOFOL N/A 07/20/2018   Dr. Gala Romney: Small hiatal hernia, erythematous mucosa in the stomach with a healing gastric ulcer measuring 1 cm, biopsies negative.   ESOPHAGOGASTRODUODENOSCOPY (EGD) WITH PROPOFOL N/A 10/05/2020   Procedure: ESOPHAGOGASTRODUODENOSCOPY (EGD) WITH PROPOFOL;  Surgeon: Daneil Dolin, MD;  [facility];  Service: Endoscopy;  Laterality: N/A;   MALONEY DILATION N/A 10/05/2020   Procedure: Venia Minks DILATION;  Surgeon: Daneil Dolin, MD;  Location: [facility];  Service: Endoscopy;  Laterality: N/A;   [place]   unilateral for infertility   NASAL SINUS SURGERY  2010  approx.   POLYPECTOMY  07/20/2018   Procedure: POLYPECTOMY;  Surgeon: Daneil Dolin, MD;  Location: [facility];  Service: Endoscopy;;  POSTERIOR CERVICAL FUSION/FORAMINOTOMY N/A 08/18/2013   Procedure: CERVICAL FOUR TO CERVICAL SEVEN POSTERIOR CERVICAL FUSION/FORAMINOTOMY LEVEL 3;  Surgeon: Hosie Spangle, MD;  Location: [facility];  Service: Neurosurgery;  Laterality: N/A;  C4-7 posterior cervical fusion with lateral mass fixation   RIGHT/LEFT HEART CATH AND CORONARY ANGIOGRAPHY N/A 12/23/2019   Procedure: RIGHT/LEFT HEART CATH AND CORONARY ANGIOGRAPHY;  Surgeon: Burnell Blanks, MD;  Location: [facility];  Service: Cardiovascular;  Laterality: N/A;   Family History  Problem Relation Age of Onset   Asthma Mother    Rheum arthritis Mother    Non-Hodgkin's lymphoma Mother    Congestive Heart Failure Mother    Atrial fibrillation Mother    Diabetes Mother    Hyperlipidemia Mother    Cancer Mother    Colon cancer Father    Multiple myeloma Father    Hypertension Father    Cancer Father    Depression Father    Anxiety disorder Father    Alcoholism Father    Obesity Father    Diabetes Maternal Grandmother    Congestive Heart Failure Maternal Grandmother    Mental illness Paternal Grandmother    Colon cancer  Paternal Grandfather    Bone cancer Paternal Grandfather    Allergies Other        entire family(mom and dad)   Liver disease Neg Hx    Social History   Tobacco Use   Smoking status: Former    Packs/day: 1.00    Years: 5.00    Total pack years: 5.00    Types: Cigarettes    Quit date: 02/09/2002    Years since quitting: 20.1    Passive exposure: Past   Smokeless tobacco: Never  Vaping Use   Vaping Use: Never used  Substance Use Topics   Alcohol use: No   Drug use: No   Current Outpatient Medications  Medication Sig Dispense Refill   acetaminophen (TYLENOL) 500 MG tablet Take 1,000 mg by mouth every 6 (six) hours as needed for moderate pain.     albuterol (PROVENTIL) (2.5 MG/3ML) 0.083% nebulizer solution USE 1 VIAL IN NEBULIZER EVERY 4 HOURS AS NEEDED FOR WHEEZING     aspirin EC 81 MG tablet Take 1 tablet (81 mg total) by mouth daily. Swallow whole. 30 tablet 11   buPROPion (WELLBUTRIN XL) 150 MG 24 hr tablet Take 3 tablets (450 mg total) by mouth daily. 270 tablet 3   busPIRone (BUSPAR) 15 MG tablet 1/3 tablet twice daily for 1 week, then increase to 2/3 tablet twice daily for 1 week, then increase to 1 tablet twice daily for anxiety. 60 tablet 1   clonazePAM (KLONOPIN) 1 MG tablet 1 p.o. 3 times daily as needed anxiety and may repeat 1/2 pill during the middle of the day as needed anxiety.  No more than 3.5 mg/day. 105 tablet 5   hydrocortisone (ANUSOL-HC) 2.5 % rectal cream Place 1 application rectally 2-4 times daily for rectal bleeding/burning related to hemorrhoids. 30 g 1   hydrOXYzine (ATARAX) 25 MG tablet TAKE 1 TO 2 TABLETS BY MOUTH EVERY 6 HOURS AS NEEDED 180 tablet 0   loperamide (IMODIUM A-D) 2 MG tablet Take 1 tablet (2 mg total) by mouth 4 (four) times daily as needed for diarrhea or loose stools. 40 tablet 0   meclizine (ANTIVERT) 25 MG tablet Take 25 mg by mouth 3 (three) times daily as needed for dizziness.     metFORMIN (GLUCOPHAGE-XR) 750 MG 24 hr tablet Take  750 mg by mouth daily.     ondansetron (ZOFRAN-ODT) 8 MG disintegrating tablet Take 1 tablet (8 mg total) by mouth 2 (two) times daily as needed for nausea or vomiting. 60 tablet 5   Oxcarbazepine (TRILEPTAL) 300 MG tablet Take 3 tablets (900 mg total) by mouth at bedtime. 270 tablet 3   pantoprazole (PROTONIX) 40 MG tablet TAKE 1 TABLET BY MOUTH TWICE DAILY WITH A MEAL 180 tablet 2   Peppermint Oil (IBGARD) 90 MG CPCR Take as directed 12 capsule 0   rosuvastatin (CRESTOR) 5 MG tablet Take 1 tablet (5 mg total) by mouth daily. 90 tablet 3   Semaglutide, 1 MG/DOSE, 4 MG/3ML SOPN Inject 1 mg into the skin once a week. 3 mL 11   sertraline (ZOLOFT) 100 MG tablet TAKE 3 TABLETS BY MOUTH AT BEDTIME 90 tablet 11   Tenapanor HCl (IBSRELA) 50 MG TABS Take 50 mg by mouth 2 (two) times daily. Lot: [id number], Exp: 03-2024 12 tablet 0   albuterol (VENTOLIN HFA) 108 (90 Base) MCG/ACT inhaler Inhale 2 puffs into the lungs every 6 (six) hours as needed for wheezing or shortness of breath. 18 g 1   cetirizine (ZYRTEC) 10 MG tablet Take 10 mg by mouth daily.     lamoTRIgine (LAMICTAL) 200 MG tablet TAKE 2 & 1/2 (TWO & ONE-HALF) TABLETS BY MOUTH ONCE DAILY 225 tablet 3   No current facility-administered medications for this visit.   Allergies  Allergen Reactions   Iohexol Hives     Desc: IVP DYE Also developed hives when drinking this contrast responded to benadryl.  Desc: IVP DYE  Desc: IVP DYE Also developed hives when drinking this contrast responded to benadryl.   Rosuvastatin Other (See Comments)    Muscle aches with '10mg'$  and '20mg'$ , tolerates '5mg'$  daily without issues Muscle aches with '20mg'$   Muscle aches with '10mg'$  and '20mg'$ , tolerates '5mg'$  daily without issues    Diatrizoate Other (See Comments)     Review of Systems: All systems reviewed and negative except where noted in HPI.   Lab Results  Component Value Date   WBC 12.0 (H) 12/25/2021   HGB 12.5 12/25/2021   HCT 37.9 12/25/2021   MCV 86.5  12/25/2021   PLT 458 (H) 12/25/2021    Lab Results  Component Value Date   CREATININE 1.04 (H) 12/25/2021   BUN 18 12/25/2021   NA 142 12/25/2021   K 3.6 12/25/2021   CL 109 12/25/2021   CO2 25 12/25/2021    Lab Results  Component Value Date   ALT 23 12/25/2021   AST 21 12/25/2021   ALKPHOS 107 12/25/2021   BILITOT 0.2 (L) 12/25/2021     Physical Exam: BP 116/76   Pulse 75   Ht '5\' 6"'$  (1.676 m)   Wt 189 lb (85.7 kg)   SpO2 98%   BMI 30.51 kg/m  Constitutional: Pleasant,well-developed, female in no acute distress. HEENT: Normocephalic and atraumatic. Conjunctivae are normal. No scleral icterus. Neck supple.  Cardiovascular: Normal rate, regular rhythm.  Pulmonary/chest: Effort normal and breath sounds normal.  Abdominal: Soft, nondistended, nontender.  There are no masses palpable. No obvious hernias. DRE - normal tone, normal squeeze, paradoxical contraction with decent- suggestive of dyssynergia - CMA Tia Alert CMA as standby Extremities: no edema Lymphadenopathy: No cervical adenopathy noted. Neurological: Alert and oriented to person place and time. Skin: Skin is warm and dry. No rashes noted. Psychiatric: Normal mood and affect. Behavior is normal.   ASSESSMENT: 52 y.o.  female here for assessment of the following  1. Chronic constipation   2. Bloating   3. Diverticulitis of colon   4. Nausea without vomiting    Discussed the symptoms with the patient at length.  At baseline she has chronic constipation, not moving her bowels well which I think is associated with diffuse bloating and abdominal discomfort.  Linzess worked for a period of time, has since transitioned to [employer] but has not working too well to have good reliable stool output.  DRE today raises question of possible dyssynergia, she could have multifactorial constipation.  I would like to refer her to pelvic floor PT to evaluate and treat for possible dyssynergia and see if that helps.  Otherwise, we  will give her a trial of Ibsrela 50 mg twice daily to see if that will help her constipation otherwise.  If this works for her we can give her a prescription.  She can hold MiraLAX while she does this.  I will also give her some samples of IBgard to use for bloating and discomfort to see if that helps.  She has had a few scan showing recurrent diverticulitis of her left colon, been treated 6 or 7 times with antibiotics for diverticulitis over the past few years.  We discussed that if she continues to have recurrent diverticulitis or complicated diverticulitis, she may want to consider surgical resection to prevent this from recurring.  Obviously want to avoid surgery if at all possible, she is going to think about this.  If she has recurrent diverticulitis symptoms she could contact me, low threshold for adding back antibiotics but need to be mindful of this given her history of C. difficile.  She otherwise has recurrent nausea that appears to be bothering her.  Prior EGD negative, CT scan without clear source otherwise, workup for biliary colic negative, no gallstones, HIDA negative.  She has been taking Ozempic for some time, counseled her this can have a variety of gastrointestinal side effects.  Given her myriad of symptoms to include nausea, abdominal pain, bowel dysfunction, I really do not think this is a great regimen for her to be taking and could be making some of the symptoms worse.  She should talk with her primary care about coming off the [place].  Discussed plan and my thoughts with the patient and her husband, answered their questions, will await her course with regimen as outlined.  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

## 2022-03-14 ENCOUNTER — Encounter: Payer: Self-pay | Admitting: Cardiology

## 2022-03-14 ENCOUNTER — Ambulatory Visit: Payer: 59 | Attending: Cardiology | Admitting: Cardiology

## 2022-03-15 ENCOUNTER — Telehealth: Payer: Self-pay | Admitting: *Deleted

## 2022-03-15 MED ORDER — FENOFIBRATE 145 MG PO TABS: 145.0000 mg | ORAL_TABLET | Freq: Every day | ORAL | 3 refills | Status: DC

## 2022-03-15 NOTE — Telephone Encounter (Signed)
Per Dr Radford Pax, can you call her and find out if she is using her device   Patient says she lost 60 lbs on ozempic and her husband says she does not snore anymore so she doesn't think she needs the cpap.  Per Dr Radford Pax, SO she needs an [place] HST to get off CPAP to see if she still has OSA   She has an in person appointment with you on 2/21. She wants to hold off on the sleep study for now because her husband is having frequent seizures and she needs to be alert to help him at all times.

## 2022-03-21 ENCOUNTER — Encounter: Payer: Self-pay | Admitting: Gastroenterology

## 2022-03-22 MED ORDER — IBSRELA 50 MG PO TABS: 50.0000 mg | ORAL_TABLET | Freq: Two times a day (BID) | ORAL | 3 refills | Status: DC

## 2022-03-26 ENCOUNTER — Ambulatory Visit: Payer: Medicare Other | Admitting: Internal Medicine

## 2022-03-28 ENCOUNTER — Other Ambulatory Visit (HOSPITAL_COMMUNITY): Payer: Self-pay

## 2022-03-28 ENCOUNTER — Encounter: Payer: Self-pay | Admitting: Gastroenterology

## 2022-03-28 ENCOUNTER — Telehealth: Payer: Self-pay | Admitting: Pharmacy Technician

## 2022-03-28 NOTE — Telephone Encounter (Signed)
[place], are you able to assist with PA for IBSrela?

## 2022-03-28 NOTE — Telephone Encounter (Signed)
Patient Advocate Encounter  Prior Authorization for IBSRELA 50MG has been approved.    PA# [id number] Effective dates: 2.15.24 through 2.15.25

## 2022-03-28 NOTE — Telephone Encounter (Signed)
Patient Advocate Encounter  Received notification from [facility] that prior authorization for [facility] is required.   PA submitted on 2.15.24 Key [id number] Status is pending

## 2022-03-29 ENCOUNTER — Telehealth: Payer: Self-pay | Admitting: *Deleted

## 2022-03-29 NOTE — Telephone Encounter (Signed)
Patient called to cancel her appointment because she has lost weight and her husband says she does not snore anymore so she does not think she needs the cpap. She has stopped using it.

## 2022-04-01 ENCOUNTER — Telehealth (INDEPENDENT_AMBULATORY_CARE_PROVIDER_SITE_OTHER): Payer: 59 | Admitting: Physician Assistant

## 2022-04-01 ENCOUNTER — Encounter: Payer: Self-pay | Admitting: Physician Assistant

## 2022-04-01 DIAGNOSIS — F411 Generalized anxiety disorder: Secondary | ICD-10-CM | POA: Diagnosis not present

## 2022-04-01 DIAGNOSIS — R454 Irritability and anger: Secondary | ICD-10-CM

## 2022-04-01 DIAGNOSIS — F5105 Insomnia due to other mental disorder: Secondary | ICD-10-CM

## 2022-04-01 DIAGNOSIS — F319 Bipolar disorder, unspecified: Secondary | ICD-10-CM | POA: Diagnosis not present

## 2022-04-01 DIAGNOSIS — F99 Mental disorder, not otherwise specified: Secondary | ICD-10-CM

## 2022-04-01 MED ORDER — BUSPIRONE HCL 15 MG PO TABS: 15.0000 mg | ORAL_TABLET | Freq: Three times a day (TID) | ORAL | 3 refills | Status: DC

## 2022-04-01 NOTE — Progress Notes (Signed)
[facility]  Patient ID: Kristen [name],  MRN: [id number]  PCP: Barrie Lyme  Date of Evaluation: 04/01/2022 Time spent:20 minutes  Chief Complaint:  Chief Complaint   Anxiety; Depression; Follow-up   Virtual Visit via Telehealth  I connected with patient by a video enabled telemedicine application with their informed consent, and verified patient privacy and that I am speaking with the correct person using two identifiers.  I am private, in my office and the patient is at home.  I discussed the limitations, risks, security and privacy concerns of performing an evaluation and management service by video and the availability of in person appointments. I also discussed with the patient that there may be a patient responsible charge related to this service. The patient expressed understanding and agreed to proceed.   I discussed the assessment and treatment plan with the patient. The patient was provided an opportunity to ask questions and all were answered. The patient agreed with the plan and demonstrated an understanding of the instructions.   The patient was advised to call back or seek an in-person evaluation if the symptoms worsen or if the condition fails to improve as anticipated.  I provided 20 minutes of non-face-to-face time during this encounter.  HISTORY/CURRENT STATUS: HPI For routine med check.  We added Buspar at [place]. It's helped some. Thinks the dose needs to be increased.  She is not quite as anxious as she had been.  Still gets panicky almost daily and commonly feels overwhelmed, needing the Klonopin to calm down.  She is still under a tremendous amount of stress, her husband is still out of work due to seizures.  Money is tight.  She is very stressed about that.  Works a part-time job, only about 6 hours/wk, at [employer]. 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 She's been on disability for at least 15 years d/t bipolar disorder, anxiety, depression,  and frequent fluctuations in mood associated with anger and irritability.  At present, her mood is pretty stable.  She does not enjoy much, does not have the money to do anything.  She does enjoy going to church.  Energy and motivation are fair to good depending on the day.  No extreme sadness, tearfulness, or feelings of hopelessness.  Sleeps fairly well right now.  ADLs and personal hygiene are normal.   Denies any changes in concentration, making decisions, or remembering things.  Appetite has not changed.  Weight is stable.  Denies suicidal or homicidal thoughts.  Has been spending more.  States she goes on [employer] and buys things, thinking that will make her happy. Patient denies increased energy with decreased need for sleep, increased talkativeness, racing thoughts, impulsivity or risky behaviors, increased libido, grandiosity, increased irritability or anger, paranoia, or hallucinations.  Denies dizziness, syncope, seizures, numbness, tingling, tremor, tics, unsteady gait, slurred speech, confusion. Denies muscle or joint pain, stiffness, or dystonia.  Individual Medical History/ Review of Systems: Changes? :No      Past medications for mental health diagnoses include: Prozac, Paxil, Lexapro, Celexa, Zoloft, Viibryd, Effexor, Cymbalta, Risperdal, Latuda, Lamictal, VPA, Lithium-became toxic, Xanax, Wellbutrin, Abilify, Willette [place], Strattera, Kapvay, Saphris, Adderall, Vyvanse, Lunesta, Sonata,  Vraylar, Seroquel, Geodon, Tegretal  Allergies: Iohexol, Rosuvastatin, and Diatrizoate  Current Medications:  Current Outpatient Medications:    acetaminophen (TYLENOL) 500 MG tablet, Take 1,000 mg by mouth every 6 (six) hours as needed for moderate pain., Disp: , Rfl:    albuterol (VENTOLIN HFA) 108 (90 Base) MCG/ACT inhaler,  Inhale 2 puffs into the lungs every 6 (six) hours as needed for wheezing or shortness of breath., Disp: 18 g, Rfl: 1   aspirin EC 81 MG tablet, Take 1 tablet (81 mg total) by mouth  daily. Swallow whole., Disp: 30 tablet, Rfl: 11   buPROPion (WELLBUTRIN XL) 150 MG 24 hr tablet, Take 3 tablets (450 mg total) by mouth daily., Disp: 270 tablet, Rfl: 3   clonazePAM (KLONOPIN) 1 MG tablet, 1 p.o. 3 times daily as needed anxiety and may repeat 1/2 pill during the middle of the day as needed anxiety.  No more than 3.5 mg/day., Disp: 105 tablet, Rfl: 5   fenofibrate (TRICOR) 145 MG tablet, Take 1 tablet (145 mg total) by mouth daily., Disp: 90 tablet, Rfl: 3   hydrocortisone (ANUSOL-HC) 2.5 % rectal cream, Place 1 application rectally 2-4 times daily for rectal bleeding/burning related to hemorrhoids., Disp: 30 g, Rfl: 1   hydrOXYzine (ATARAX) 25 MG tablet, TAKE 1 TO 2 TABLETS BY MOUTH EVERY 6 HOURS AS NEEDED, Disp: 180 tablet, Rfl: 0   lamoTRIgine (LAMICTAL) 200 MG tablet, TAKE 2 & 1/2 (TWO & ONE-HALF) TABLETS BY MOUTH ONCE DAILY, Disp: 225 tablet, Rfl: 3   levocetirizine (XYZAL) 5 MG tablet, Take 5 mg by mouth every evening., Disp: , Rfl:    loperamide (IMODIUM A-D) 2 MG tablet, Take 1 tablet (2 mg total) by mouth 4 (four) times daily as needed for diarrhea or loose stools., Disp: 40 tablet, Rfl: 0   meclizine (ANTIVERT) 25 MG tablet, Take 25 mg by mouth 3 (three) times daily as needed for dizziness., Disp: , Rfl:    metFORMIN (GLUCOPHAGE-XR) 750 MG 24 hr tablet, Take 750 mg by mouth daily., Disp: , Rfl:    ondansetron (ZOFRAN-ODT) 8 MG disintegrating tablet, Take 1 tablet (8 mg total) by mouth 2 (two) times daily as needed for nausea or vomiting., Disp: 60 tablet, Rfl: 5   Oxcarbazepine (TRILEPTAL) 300 MG tablet, Take 3 tablets (900 mg total) by mouth at bedtime., Disp: 270 tablet, Rfl: 3   pantoprazole (PROTONIX) 40 MG tablet, TAKE 1 TABLET BY MOUTH TWICE DAILY WITH A MEAL (Patient taking differently: Take 40 mg by mouth daily.), Disp: 180 tablet, Rfl: 2   Peppermint Oil (IBGARD) 90 MG CPCR, Take as directed, Disp: 12 capsule, Rfl: 0   rosuvastatin (CRESTOR) 5 MG tablet, Take 1  tablet (5 mg total) by mouth daily., Disp: 90 tablet, Rfl: 3   Semaglutide, 1 MG/DOSE, 4 MG/3ML SOPN, Inject 1 mg into the skin once a week., Disp: 3 mL, Rfl: 11   sertraline (ZOLOFT) 100 MG tablet, TAKE 3 TABLETS BY MOUTH AT BEDTIME, Disp: 90 tablet, Rfl: 11   Tenapanor HCl (IBSRELA) 50 MG TABS, Take 50 mg by mouth 2 (two) times daily., Disp: 60 tablet, Rfl: 3   albuterol (PROVENTIL) (2.5 MG/3ML) 0.083% nebulizer solution, USE 1 VIAL IN NEBULIZER EVERY 4 HOURS AS NEEDED FOR WHEEZING (Patient not taking: Reported on 03/14/2022), Disp: , Rfl:    busPIRone (BUSPAR) 15 MG tablet, Take 1 tablet (15 mg total) by mouth 3 (three) times daily., Disp: 270 tablet, Rfl: 3   cetirizine (ZYRTEC) 10 MG tablet, Take 10 mg by mouth daily. (Patient not taking: Reported on 03/14/2022), Disp: , Rfl:  Medication Side Effects:  Sweating  Family Medical/ Social History: Changes? Husband still out of work b/c seizures.  MENTAL HEALTH EXAM:  There were no vitals taken for this visit.There is no height or weight on file  to calculate BMI.  General Appearance:  Unable to assess  Eye Contact:   Unable to assess  Speech:  Clear and Coherent and Normal Rate  Volume:  Normal  Mood:  Anxious  Affect:   Unable to assess  Thought Process:  Goal Directed and Descriptions of Associations: Circumstantial  Orientation:  Full (Time, Place, and Person)  Thought Content: Logical   Suicidal Thoughts:  No  Homicidal Thoughts:  No  Memory:  WNL  Judgement:  Good  Insight:  Good  Psychomotor Activity:   Unable to assess  Concentration:  Concentration: Good and Attention Span: Good  Recall:  Good  Fund of Knowledge: Good  Language: Good  Assets:  Desire for Improvement  ADL's:  Intact  Cognition: WNL  Prognosis:  Good   Gene site test results are under labs dated 04/03/2021.  DIAGNOSES:    ICD-10-CM   1. Bipolar disorder, rapid cycling ([place])  F31.9     2. Generalized anxiety disorder  F41.1     3. Insomnia due to other  mental disorder  F51.05    F99     4. Irritability and anger  R45.4      Receiving Psychotherapy: Yes    Felix Ahmadi  RECOMMENDATIONS:  PDMP was reviewed.  Last Klonopin filled 03/19/2022. I provided 20 minutes of non-face-to-face time during this encounter, including time spent before and after the visit in records review, medical decision making, counseling pertinent to today's visit, and charting.   We discussed her disability.  I see no way for her to be able to hold down a job.  She has rapid cycling bipolar disorder which is more controlled sometimes than others. She can be in a manic episode where she does not sleep much, spends a lot of money, is impulsive and grandiose, then quickly flip to a depressed state when she will stay in bed for days.  She is often unable to control her irritability and anger and is in therapy in part to help with anger management.  She is often anxious, requiring a benzodiazepine to help her get through every day.  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  It will be detrimental to her mental health.  Continue Wellbutrin XL 150 mg, 3 p.o. every morning. Increase BuSpar to 15 mg p.o. 3 times daily. Continue Klonopin 1 mg 1 p.o. 3 times daily as needed, with occasional extra one half p.o. midday as needed. Continue hydroxyzine 25 mg, 1 p.o. 3 times daily as needed. Continue Lamictal 200 mg, 2.5 pills daily. Continue Trileptal 300 mg, 3 nightly. Continue Zoloft 100 mg, 3 p.o. daily. Continue counseling with Felix Ahmadi.  Return in 6 weeks.   [name], PA-C

## 2022-04-03 ENCOUNTER — Ambulatory Visit: Payer: 59 | Admitting: Cardiology

## 2022-04-06 ENCOUNTER — Other Ambulatory Visit: Payer: Self-pay | Admitting: Physician Assistant

## 2022-05-07 ENCOUNTER — Telehealth: Payer: Self-pay | Admitting: Physician Assistant

## 2022-05-07 NOTE — Telephone Encounter (Signed)
Next appt is 05/13/22. Kristen [name] 's Buspar was increased but since then she has had anxiety and panic attacks everyday. She needs something else. She was teary eyed while leaving message. Her phone number is [phone].  Pharmacy is:  [facility] [address], [place] - [place] [address]   Phone: [phone]  Fax: [phone]

## 2022-05-07 NOTE — Telephone Encounter (Signed)
Patient requests that 4/1 appt with Helene Kelp be canceled d/t schedule conflict. She will call and reschedule.

## 2022-05-08 NOTE — Telephone Encounter (Signed)
Canc 4/1 apt

## 2022-05-13 ENCOUNTER — Telehealth: Payer: Self-pay | Admitting: Physician Assistant

## 2022-05-13 ENCOUNTER — Telehealth: Payer: 59 | Admitting: Physician Assistant

## 2022-05-13 NOTE — Telephone Encounter (Signed)
Kristen [name] called in today at 3:30pm. She was unable to keep appt today due to taking spouse to doctor. She R/S her appt for 4/10. But she is having so much depression. She cries all the time, has no one to help her or talk to. 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 She feels like she is in a dark hole, but not suicidal at all. Of note, her recent labs came back with very high platelets and high calcium. If she doesn't hear from you, she will see you on 4/10.

## 2022-05-14 NOTE — Telephone Encounter (Signed)
Please make sure she's seeing her counselor routinely. This seems more situational and a med change isn't going to change the circumstances. I prefer not to make any changes until we talk at least.

## 2022-05-15 NOTE — Telephone Encounter (Signed)
Patient reported she does routinely see a counselor and has an appt tomorrow.  Information relayed to her and she was okay with that.

## 2022-05-22 ENCOUNTER — Telehealth: Payer: Medicare Other | Admitting: Physician Assistant

## 2022-05-28 ENCOUNTER — Telehealth: Payer: Medicare Other | Admitting: Physician Assistant

## 2022-06-03 ENCOUNTER — Telehealth: Payer: Medicare Other | Admitting: Physician Assistant

## 2022-06-04 ENCOUNTER — Other Ambulatory Visit: Payer: Self-pay | Admitting: Physician Assistant

## 2022-06-04 MED ORDER — CLONAZEPAM 1 MG PO TABS: ORAL_TABLET | ORAL | 5 refills | Status: DC

## 2022-06-04 NOTE — Telephone Encounter (Signed)
sent 

## 2022-06-04 NOTE — Telephone Encounter (Signed)
Pt called at 11:05a.  She has appt on 4/29.  She called to see if she could come earlier.  She is asking for early refill of Clonazepam, but she says she got it on 3/15.  Based on the qty and how many she is prescribed per day (3.5), she should be due a refill.  She said her son just found out he has cancer and he's in the [employer].  Her son's wife is divorcing him.  She has to go to the oncologist.  She said Rosey Bath knows that her husband has a seizure disorder and has been out of work for 2 yrs and she is "just about to lose it".  She started crying twice during the conversation.  She said she has 3 pills left (approx 1 day's worth).  Script can be sent to [facility] [address], [place] -

## 2022-06-05 NOTE — Progress Notes (Signed)
HEMATOLOGY/ONCOLOGY CONSULTATION NOTE  Date of Service: 06/06/2022  Patient Care Team: Exie Parody as PCP - General (Physician Assistant) [name], RD as Dietitian (Family Medicine) Jena Gauss [name], MD as Consulting Physician (Gastroenterology)  CHIEF COMPLAINTS/PURPOSE OF CONSULTATION:  Evaluation and management of thrombocytosis.   HISTORY OF PRESENTING ILLNESS:   Kristen [name] is a wonderful 52 y.o. female who has been referred to [place] by [name], [place] for evaluation and management of thrombocytosis.  Today, she is accompanied by her husband.  She denies any hx of blood clots in the past. She notes that she had thrombocytosis in 2021 and it was recommended to proceed with a bone marrow biopsy.  She reports that she does endorse a protruding disc and does experience constant major pain in her thighs, but denies any pain in her back. To improve symptoms, she down lie down on a pillow. She will be seen by a neurosurgeon in May 2024. She does take Gabapentin but is unsure if it improves symptoms.   She's notes that she has had three previous surgeries in her neck. Patient denies any history of heart attack or stroke-like symptoms. She reports that in 2022-2023 she has had 5 UTIs. Patient's most recent diverticulitis episode was 2023. She denies any other recent infections. She reports that it has been greater than 20 years since she has last smoked.  She reports that her TSH was elevated previously. She denies any family history of thyroid disease. Patient does receive colonoscopies regularly, and her most recent was in 2023. She reports that polyps were found that have been removed.   She does complain of a left-sided headache during mornings. She does not see a neurologist for this. She also complains of constant dizziness which she describes as both vertigo and sense of imbalance. She report that she sometimes walks to the side. She also complains of  worsened fatigue over the last couple of months.   She takes Zofran frequently for nausea. She notes that if she consumes food at night, her nausea is worsened. Patient does take 75 micrograms Synthroid regularly. She notes that she has been on Ozempic for about a year, and her weight has decreased from 250 to 183 pounds. She has been on Protonix for several years, but notes that her dose has been adjusted to once daily.  She reports that she was infected with COVID-29 November 2021 and has struggled with persistent coughing since her infection. She reports that she takes allergy medication daily due to persistent coughing.  She does endorse hemorrhoids and constipation, but she does take medication which manages symptoms.   She reports having stress for the past two years which she attributes to her husband having frequent seizures and her son having platelet issues.  In regards to family history, she reports that her mother did have non-hodgkin's lymphoma on three occasions. The first of which was B cell lymphoma, and the second and third presented in her mother's throat. Her mother was not a smoker. She notes that her mother passed away from diverticulosis and sepsis. She reports that her son does endorse the JAK-2 mutation and essential thrombocytosis.   She did have previous genetic testing due to FHx of colon cancer. She reports that her father did have colon cancer in his late 50s, Colon cancer was also endorsed by her paternal aunt, and paternal grandma in her 17s. She reports that her paternal grandfather did have bone cancer but is  unsure of the exact type. She notes that her father did have multiple myeloma.  She does report pain in the abdomen on palpation. She denies any lower abdominal pain. The pain is not present without palpation. Her husband reports that an occasional soft-ball sized knot does protrude. The knot is present when standing any lying down.   Patient denies any bone pain,  abnormal bleeding, leg swelling or rashes. She notes that she does bruise easily in her lower extremities and is on Aspirin.   MEDICAL HISTORY:  Past Medical History:  Diagnosis Date   Allergies    Anemia    Anxiety    Arthritis    Atypical mole 11/03/2006   mid upper back (slight to moderate)   Atypical mole 11/03/2006   lower right back (slight to moderate)   Back pain    Bipolar 1 disorder (HCC)    Borderline diabetic    Chronic constipation    Depression    Diabetes (HCC)    Diverticulitis    Family history of adverse reaction to anesthesia    mother-- ponv   Fatty liver    GAD (generalized anxiety disorder)    GERD (gastroesophageal reflux disease)    Hiatal hernia    High triglycerides    History of kidney stones    History of recurrent UTIs    History of sepsis 06/2018   History of stomach ulcers    History of suicidal ideation    Hyperlipidemia    Hypothyroidism    IDA (iron deficiency anemia)    Joint pain    Melanoma (HCC) 11/03/2006   left chest in situ (excision)   Orthostasis    Palpitations    PONV (postoperative nausea and vomiting)    Rapid heart rate    Renal calculus, left    Seasonal allergies    Shortness of breath    Sleep disorder, unspecified    per excessive sleeping during the day   Snoring    Suicide ideation    Swallowing difficulty    Tachycardia    Vertigo    Vitamin D deficiency    Weakness    Wears contact lenses     SURGICAL HISTORY: Past Surgical History:  Procedure Laterality Date   ANTERIOR CERVICAL DECOMP/DISCECTOMY FUSION  02/17/2012   Procedure: ANTERIOR CERVICAL DECOMPRESSION/DISCECTOMY FUSION 2 LEVELS;  Surgeon: Hewitt Shorts, MD;  Location: [facility];  Service: Neurosurgery;  Laterality: N/A;  Cervical four-five and Cervical six-seven anterior cervial decompression with fusion plating and bonegraft   ANTERIOR CERVICAL DECOMP/DISCECTOMY FUSION  04-01-2001   @[facility]    C5---6   BIOPSY  07/20/2018   Procedure:  BIOPSY;  Surgeon: Corbin Ade, MD;  Location: [facility];  Service: Endoscopy;;  gastric    BREAST REDUCTION SURGERY Bilateral 1995   CESAREAN SECTION  x2   last one 1998   with BILATERAL TUBAL LIGATION   CESAREAN SECTION WITH BILATERAL TUBAL LIGATION     COLONOSCOPY WITH PROPOFOL N/A 07/20/2018   Dr. Jena Gauss: Diverticulosis, internal hemorrhoids, 5 mm splenic flexure tubular adenoma removed. Repeat in 5 years due to family history.   COLONOSCOPY WITH PROPOFOL N/A 12/07/2020   Procedure: COLONOSCOPY WITH PROPOFOL;  Surgeon: Corbin Ade, MD;  Location: [facility];  Service: Endoscopy;  Laterality: N/A;  2:30pm   CYSTOSCOPY/URETEROSCOPY/HOLMIUM LASER/STENT PLACEMENT Left 08/21/2018   Procedure: CYSTOSCOPY LEFT RETROGRADE PYELOGRAM /URETEROSCOPY/HOLMIUM LASER/STENT PLACEMENT;  Surgeon: Rene Paci, MD;  [facility];  Service: Urology;  Laterality: Left;   DILATION AND CURETTAGE OF UTERUS  1992   for miscarriage   ENDOMETRIAL ABLATION  2014   ESOPHAGOGASTRODUODENOSCOPY     20 years ago.    ESOPHAGOGASTRODUODENOSCOPY (EGD) WITH PROPOFOL N/A 07/20/2018   Dr. Jena Gauss: Small hiatal hernia, erythematous mucosa in the stomach with a healing gastric ulcer measuring 1 cm, biopsies negative.   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   POLYPECTOMY  07/20/2018   Procedure: POLYPECTOMY;  Surgeon: Corbin Ade, MD;  Location: [facility];  Service: Endoscopy;;   POSTERIOR CERVICAL FUSION/FORAMINOTOMY N/A 08/18/2013   Procedure:  CERVICAL FOUR TO CERVICAL SEVEN POSTERIOR CERVICAL FUSION/FORAMINOTOMY LEVEL 3;  Surgeon: Hewitt Shorts, MD;  Location: [facility];  Service: Neurosurgery;  Laterality: N/A;  C4-7 posterior cervical fusion with lateral mass fixation   RIGHT/LEFT HEART CATH AND CORONARY ANGIOGRAPHY N/A 12/23/2019   Procedure: RIGHT/LEFT HEART CATH AND CORONARY ANGIOGRAPHY;  Surgeon: Kathleene Hazel, MD;  Location: [facility];  Service: Cardiovascular;  Laterality: N/A;    SOCIAL HISTORY: Social History   Socioeconomic History   Marital status: Married    Spouse name: Not on file   Number of children: 2   Years of education: Not on file   Highest education level: Not on file  Occupational History   Occupation: disability  Tobacco Use   Smoking status: Former    Packs/day: 1.00    Years: 5.00    Additional pack years: 0.00    Total pack years: 5.00    Types: Cigarettes    Quit date: 02/09/2002    Years since quitting: 20.3    Passive exposure: Past   Smokeless tobacco: Never  Vaping Use   Vaping Use: Never used  Substance and Sexual Activity   Alcohol use: No   Drug use: No   Sexual activity: Yes    Partners: Male    Birth control/protection: Surgical    Comment: spouse  Other Topics Concern   Not on file  Social History Narrative   Step brother-2   Step sister-2   Half sister -1 healthy   Social Determinants of Health   Financial Resource Strain: Low Risk  (07/10/2018)   Overall Financial Resource Strain (CARDIA)    Difficulty of Paying Living Expenses: Not very hard  Food Insecurity: No Food Insecurity (07/10/2018)   Hunger Vital Sign    Worried About Running Out of Food in the Last Year: Never true    Ran Out of Food in the Last Year: Never true  Transportation Needs: No Transportation Needs (07/10/2018)   PRAPARE - Administrator, Civil Service (Medical): No    Lack of Transportation (Non-Medical): No  Physical Activity: Unknown (07/10/2018)    Exercise Vital Sign    Days of Exercise per Week: Patient declined    Minutes of Exercise per Session: Patient declined  Stress: Stress Concern Present (07/10/2018)   [employer] of Occupational Health - Occupational Stress Questionnaire    Feeling of Stress : Rather much  Social Connections: Unknown (07/10/2018)  Social Advertising account executive [NHANES]    Frequency of Communication with Friends and Family: Patient declined    Frequency of Social Gatherings with Friends and Family: Patient declined    Attends Religious Services: Patient declined    Database administrator or Organizations: Patient declined    Attends Banker Meetings: Patient declined    Marital Status: Patient declined  Intimate Partner Violence: Not At Risk (07/10/2018)   Humiliation, Afraid, Rape, and Kick questionnaire    Fear of Current or Ex-Partner: No    Emotionally Abused: No    Physically Abused: No    Sexually Abused: No    FAMILY HISTORY: Family History  Problem Relation Age of Onset   Asthma Mother    Rheum arthritis Mother    Non-Hodgkin's lymphoma Mother    Congestive Heart Failure Mother    Atrial fibrillation Mother    Diabetes Mother    Hyperlipidemia Mother    Cancer Mother    Colon cancer Father    Multiple myeloma Father    Hypertension Father    Cancer Father    Depression Father    Anxiety disorder Father    Alcoholism Father    Obesity Father    Diabetes Maternal Grandmother    Congestive Heart Failure Maternal Grandmother    Mental illness Paternal Grandmother    Colon cancer Paternal Grandfather    Bone cancer Paternal Grandfather    Allergies Other        entire family(mom and dad)   Liver disease Neg Hx     ALLERGIES:  is allergic to iohexol, rosuvastatin, and diatrizoate.  MEDICATIONS:  Current Outpatient Medications  Medication Sig Dispense Refill   acetaminophen (TYLENOL) 500 MG tablet Take 1,000 mg by mouth every 6 (six) hours as needed for  moderate pain.     albuterol (PROVENTIL) (2.5 MG/3ML) 0.083% nebulizer solution USE 1 VIAL IN NEBULIZER EVERY 4 HOURS AS NEEDED FOR WHEEZING (Patient not taking: Reported on 03/14/2022)     albuterol (VENTOLIN HFA) 108 (90 Base) MCG/ACT inhaler Inhale 2 puffs into the lungs every 6 (six) hours as needed for wheezing or shortness of breath. 18 g 1   aspirin EC 81 MG tablet Take 1 tablet (81 mg total) by mouth daily. Swallow whole. 30 tablet 11   buPROPion (WELLBUTRIN XL) 150 MG 24 hr tablet Take 3 tablets (450 mg total) by mouth daily. 270 tablet 3   busPIRone (BUSPAR) 15 MG tablet Take 1 tablet (15 mg total) by mouth 3 (three) times daily. 270 tablet 3   cetirizine (ZYRTEC) 10 MG tablet Take 10 mg by mouth daily. (Patient not taking: Reported on 03/14/2022)     clonazePAM (KLONOPIN) 1 MG tablet 1 p.o. 3 times daily as needed anxiety and may repeat 1/2 pill during the middle of the day as needed anxiety.  No more than 3.5 mg/day. 105 tablet 5   fenofibrate (TRICOR) 145 MG tablet Take 1 tablet (145 mg total) by mouth daily. 90 tablet 3   hydrocortisone (ANUSOL-HC) 2.5 % rectal cream Place 1 application rectally 2-4 times daily for rectal bleeding/burning related to hemorrhoids. 30 g 1   hydrOXYzine (ATARAX) 25 MG tablet Take 1 tablet (25 mg total) by mouth 3 (three) times daily as needed. 180 tablet 0   lamoTRIgine (LAMICTAL) 200 MG tablet TAKE 2 & 1/2 (TWO & ONE-HALF) TABLETS BY MOUTH ONCE DAILY 225 tablet 3   levocetirizine (XYZAL) 5 MG tablet Take 5 mg by mouth  every evening.     loperamide (IMODIUM A-D) 2 MG tablet Take 1 tablet (2 mg total) by mouth 4 (four) times daily as needed for diarrhea or loose stools. 40 tablet 0   meclizine (ANTIVERT) 25 MG tablet Take 25 mg by mouth 3 (three) times daily as needed for dizziness.     metFORMIN (GLUCOPHAGE-XR) 750 MG 24 hr tablet Take 750 mg by mouth daily.     ondansetron (ZOFRAN-ODT) 8 MG disintegrating tablet Take 1 tablet (8 mg total) by mouth 2 (two)  times daily as needed for nausea or vomiting. 60 tablet 5   Oxcarbazepine (TRILEPTAL) 300 MG tablet Take 3 tablets (900 mg total) by mouth at bedtime. 270 tablet 3   pantoprazole (PROTONIX) 40 MG tablet TAKE 1 TABLET BY MOUTH TWICE DAILY WITH A MEAL (Patient taking differently: Take 40 mg by mouth daily.) 180 tablet 2   Peppermint Oil (IBGARD) 90 MG CPCR Take as directed 12 capsule 0   rosuvastatin (CRESTOR) 5 MG tablet Take 1 tablet (5 mg total) by mouth daily. 90 tablet 3   Semaglutide, 1 MG/DOSE, 4 MG/3ML SOPN Inject 1 mg into the skin once a week. 3 mL 11   sertraline (ZOLOFT) 100 MG tablet TAKE 3 TABLETS BY MOUTH AT BEDTIME 90 tablet 11   Tenapanor HCl (IBSRELA) 50 MG TABS Take 50 mg by mouth 2 (two) times daily. 60 tablet 3   No current facility-administered medications for this visit.    REVIEW OF SYSTEMS:    10 Point review of Systems was done is negative except as noted above.  PHYSICAL EXAMINATION: ECOG PERFORMANCE STATUS: 1 - Symptomatic but completely ambulatory  . Vitals:   06/06/22 0920  BP: (!) 114/49  Pulse: 88  Resp: 20  Temp: 97.9 F (36.6 C)  SpO2: 97%   Filed Weights   06/06/22 0920  Weight: 183 lb 3.2 oz (83.1 kg)   .Body mass index is 29.57 kg/m.  GENERAL:alert, in no acute distress and comfortable SKIN: no acute rashes, no significant lesions EYES: conjunctiva are pink and non-injected, sclera anicteric OROPHARYNX: MMM, no exudates, no oropharyngeal erythema or ulceration NECK: supple, no JVD LYMPH:  no palpable lymphadenopathy in the cervical, axillary or inguinal regions LUNGS: clear to auscultation b/l with normal respiratory effort HEART: regular rate & rhythm ABDOMEN:  normoactive bowel sounds , non tender, not distended. Extremity: no pedal edema PSYCH: alert & oriented x 3 with fluent speech NEURO: no focal motor/sensory deficits  LABORATORY DATA:  I have reviewed the data as listed  .    Latest Ref Rng & Units 06/06/2022   10:29 AM  12/25/2021    9:21 AM 12/17/2021   11:28 PM  CBC  WBC 4.0 - 10.5 K/uL 7.8  12.0  15.4   Hemoglobin 12.0 - 15.0 g/dL 40.9  81.1  91.4   Hematocrit 36.0 - 46.0 % 36.2  37.9  37.1   Platelets 150 - 400 K/uL 445  458  424     .    Latest Ref Rng & Units 12/25/2021    9:21 AM 12/17/2021   11:28 PM 08/15/2021    9:18 AM  CMP  Glucose 70 - 99 mg/dL 782  83    BUN 6 - 20 mg/dL 18  21    Creatinine 9.56 - 1.00 mg/dL 2.13  0.86    Sodium 578 - 145 mmol/L 142  139    Potassium 3.5 - 5.1 mmol/L 3.6  3.4  Chloride 98 - 111 mmol/L 109  104    CO2 22 - 32 mmol/L 25  26    Calcium 8.9 - 10.3 mg/dL 9.0  9.2    Total Protein 6.5 - 8.1 g/dL 6.9  7.6  6.9   Total Bilirubin 0.3 - 1.2 mg/dL 0.2  0.6  0.5   Alkaline Phos 38 - 126 U/L 107  111    AST 15 - 41 U/L 21  20  25    ALT 0 - 44 U/L 23  25  30     05/22/2022 CBC      CMP 05/22/2022:     RADIOGRAPHIC STUDIES: I have personally reviewed the radiological images as listed and agreed with the findings in the report. No results found.  ASSESSMENT & PLAN:   Wonderful 52 y.o. female with:  Thrombocytosis - r/o Essential Thrombocytosis. Son has high platelets as well.  PLAN: -Discussed lab results from 05/22/2022 in detail with patient. CBC showed WBC of 6.6K, hemoglobin of 11.9, and platelets of 505K. -mild neutrophilia -Discussed results of CT scans on 12/17/2021 and 10/12/2020 in detail -discussed the details of myeloproliferative disorders -informed patient that thrombocytosis may either be a reactive process or a primary genetic change in the bone marrow causing a MPN -discussed risks associated with primary thrombocytosis.  -if there is no other disorder present, correcting iron deficiency should correct patient's platelet levels -Patient has no overt symptoms related to thrombocytosis at this time -do not anticipate that patient endorses a bone marrow problem, will order blood tests to confirm -informed patient that anti seizure  medications can improve headaches -informed patient that Protonix can cause fatigue related to vitamin B deficiencies -informed patient that iron deficiency might be caused by acid suppressants -recommend patient to engage in water aerobics if swimming is tolerable to engage lower back muscles -continue to follow up with gastroenterologist for optimal evaluation and management of abdominal protrusion -will order blood tests for further evaluation of genetics -answered all of patients questions in detail  FOLLOW-UP: Labs today Phone visit with Dr Candise Che in 2 weeks  The total time spent in the appointment was 60 minutes* .  All of the patient's questions were answered with apparent satisfaction. The patient knows to call the clinic with any problems, questions or concerns.   Wyvonnia Lora MD MS AAHIVMS [facility] Hematology/Oncology Physician [facility]  .*Total Encounter Time as defined by the Centers for Medicare and Medicaid Services includes, in addition to the face-to-face time of a patient visit (documented in the note above) non-face-to-face time: obtaining and reviewing outside history, ordering and reviewing medications, tests or procedures, care coordination (communications with other health care professionals or caregivers) and documentation in the medical record.   I,Mitra Faeizi,acting as a Neurosurgeon for Wyvonnia Lora, MD.,have documented all relevant documentation on the behalf of Wyvonnia Lora, MD,as directed by  Wyvonnia Lora, MD while in the presence of Wyvonnia Lora, MD.  .I have reviewed the above documentation for accuracy and completeness, and I agree with the above. Johney Maine MD

## 2022-06-06 ENCOUNTER — Other Ambulatory Visit: Payer: Self-pay

## 2022-06-06 ENCOUNTER — Inpatient Hospital Stay: Payer: Medicare Other

## 2022-06-06 ENCOUNTER — Inpatient Hospital Stay: Payer: Medicare Other | Attending: Hematology | Admitting: Hematology

## 2022-06-06 VITALS — BP 114/49 | HR 88 | Temp 97.9°F | Resp 20 | Wt 183.2 lb

## 2022-06-06 DIAGNOSIS — D75839 Thrombocytosis, unspecified: Secondary | ICD-10-CM | POA: Diagnosis present

## 2022-06-06 DIAGNOSIS — Z800 Family history of malignant neoplasm of digestive organs: Secondary | ICD-10-CM

## 2022-06-06 DIAGNOSIS — Z8349 Family history of other endocrine, nutritional and metabolic diseases: Secondary | ICD-10-CM | POA: Insufficient documentation

## 2022-06-06 DIAGNOSIS — Z8249 Family history of ischemic heart disease and other diseases of the circulatory system: Secondary | ICD-10-CM | POA: Diagnosis not present

## 2022-06-06 DIAGNOSIS — Z825 Family history of asthma and other chronic lower respiratory diseases: Secondary | ICD-10-CM | POA: Diagnosis not present

## 2022-06-06 DIAGNOSIS — Z811 Family history of alcohol abuse and dependence: Secondary | ICD-10-CM

## 2022-06-06 DIAGNOSIS — Z8261 Family history of arthritis: Secondary | ICD-10-CM | POA: Insufficient documentation

## 2022-06-06 DIAGNOSIS — Z809 Family history of malignant neoplasm, unspecified: Secondary | ICD-10-CM | POA: Diagnosis not present

## 2022-06-06 DIAGNOSIS — Z87891 Personal history of nicotine dependence: Secondary | ICD-10-CM

## 2022-06-06 DIAGNOSIS — Z818 Family history of other mental and behavioral disorders: Secondary | ICD-10-CM | POA: Diagnosis not present

## 2022-06-06 DIAGNOSIS — Z807 Family history of other malignant neoplasms of lymphoid, hematopoietic and related tissues: Secondary | ICD-10-CM | POA: Insufficient documentation

## 2022-06-06 DIAGNOSIS — Z833 Family history of diabetes mellitus: Secondary | ICD-10-CM | POA: Insufficient documentation

## 2022-06-06 DIAGNOSIS — Z79899 Other long term (current) drug therapy: Secondary | ICD-10-CM | POA: Diagnosis not present

## 2022-06-06 DIAGNOSIS — R519 Headache, unspecified: Secondary | ICD-10-CM | POA: Insufficient documentation

## 2022-06-06 DIAGNOSIS — Z808 Family history of malignant neoplasm of other organs or systems: Secondary | ICD-10-CM | POA: Insufficient documentation

## 2022-06-06 LAB — CMP (CANCER CENTER ONLY)
ALT: 17 U/L (ref 0–44)
AST: 24 U/L (ref 15–41)
Albumin: 4.9 g/dL (ref 3.5–5.0)
Alkaline Phosphatase: 63 U/L (ref 38–126)
Anion gap: 6 (ref 5–15)
BUN: 20 mg/dL (ref 6–20)
CO2: 29 mmol/L (ref 22–32)
Calcium: 10.2 mg/dL (ref 8.9–10.3)
Chloride: 108 mmol/L (ref 98–111)
Creatinine: 1.05 mg/dL — ABNORMAL HIGH (ref 0.44–1.00)
GFR, Estimated: >60 mL/min (ref >60)
Glucose, Bld: 77 mg/dL (ref 70–99)
Potassium: 3.9 mmol/L (ref 3.5–5.1)
Sodium: 143 mmol/L (ref 135–145)
Total Bilirubin: 0.4 mg/dL (ref 0.3–1.2)
Total Protein: 7.2 g/dL (ref 6.5–8.1)

## 2022-06-06 LAB — TSH: TSH: 1.58 u[IU]/mL (ref 0.350–4.500)

## 2022-06-06 LAB — CBC WITH DIFFERENTIAL (CANCER CENTER ONLY)
Abs Immature Granulocytes: 0.03 10*3/uL (ref 0.00–0.07)
Basophils Absolute: 0.1 10*3/uL (ref 0.0–0.1)
Basophils Relative: 1 %
Eosinophils Absolute: 0.2 10*3/uL (ref 0.0–0.5)
Eosinophils Relative: 3 %
HCT: 36.2 % (ref 36.0–46.0)
Hemoglobin: 12 g/dL (ref 12.0–15.0)
Immature Granulocytes: 0 %
Lymphocytes Relative: 28 %
Lymphs Abs: 2.2 10*3/uL (ref 0.7–4.0)
MCH: 28.7 pg (ref 26.0–34.0)
MCHC: 33.1 g/dL (ref 30.0–36.0)
MCV: 86.6 fL (ref 80.0–100.0)
Monocytes Absolute: 0.6 10*3/uL (ref 0.1–1.0)
Monocytes Relative: 8 %
Neutro Abs: 4.7 10*3/uL (ref 1.7–7.7)
Neutrophils Relative %: 60 %
Platelet Count: 445 10*3/uL — ABNORMAL HIGH (ref 150–400)
RBC: 4.18 MIL/uL (ref 3.87–5.11)
RDW: 14.1 % (ref 11.5–15.5)
WBC Count: 7.8 10*3/uL (ref 4.0–10.5)
nRBC: 0 % (ref 0.0–0.2)

## 2022-06-06 LAB — T4, FREE: Free T4: 0.94 ng/dL (ref 0.61–1.12)

## 2022-06-06 LAB — VITAMIN B12: Vitamin B-12: 347 pg/mL (ref 180–914)

## 2022-06-06 LAB — LACTATE DEHYDROGENASE: LDH: 171 U/L (ref 98–192)

## 2022-06-06 LAB — FERRITIN: Ferritin: 134 ng/mL (ref 11–307)

## 2022-06-10 ENCOUNTER — Encounter: Payer: Self-pay | Admitting: Physician Assistant

## 2022-06-10 ENCOUNTER — Telehealth (INDEPENDENT_AMBULATORY_CARE_PROVIDER_SITE_OTHER): Payer: 59 | Admitting: Physician Assistant

## 2022-06-10 DIAGNOSIS — F4323 Adjustment disorder with mixed anxiety and depressed mood: Secondary | ICD-10-CM

## 2022-06-10 DIAGNOSIS — G2581 Restless legs syndrome: Secondary | ICD-10-CM | POA: Diagnosis not present

## 2022-06-10 DIAGNOSIS — Z79899 Other long term (current) drug therapy: Secondary | ICD-10-CM

## 2022-06-10 DIAGNOSIS — F319 Bipolar disorder, unspecified: Secondary | ICD-10-CM

## 2022-06-10 DIAGNOSIS — F5105 Insomnia due to other mental disorder: Secondary | ICD-10-CM | POA: Diagnosis not present

## 2022-06-10 DIAGNOSIS — F99 Mental disorder, not otherwise specified: Secondary | ICD-10-CM

## 2022-06-10 MED ORDER — BUSPIRONE HCL 15 MG PO TABS: 30.0000 mg | ORAL_TABLET | Freq: Two times a day (BID) | ORAL | 3 refills | Status: DC

## 2022-06-10 MED ORDER — HYDROXYZINE HCL 50 MG PO TABS: 50.0000 mg | ORAL_TABLET | Freq: Four times a day (QID) | ORAL | 0 refills | Status: DC | PRN

## 2022-06-10 NOTE — Progress Notes (Signed)
[facility]  Patient ID: Kristen [name],  MRN: [id number]  PCP: Exie Parody  Date of Evaluation: 06/10/2022 Time spent:30 minutes  Chief Complaint:  Chief Complaint   Anxiety; Depression    Virtual Visit via Telehealth  I connected with patient by a video enabled telemedicine application with their informed consent, and verified patient privacy and that I am speaking with the correct person using two identifiers.  I am private, in my office and the patient is at home.  I discussed the limitations, risks, security and privacy concerns of performing an evaluation and management service by video and the availability of in person appointments. I also discussed with the patient that there may be a patient responsible charge related to this service. The patient expressed understanding and agreed to proceed.   I discussed the assessment and treatment plan with the patient. The patient was provided an opportunity to ask questions and all were answered. The patient agreed with the plan and demonstrated an understanding of the instructions.   The patient was advised to call back or seek an in-person evaluation if the symptoms worsen or if the condition fails to improve as anticipated.  I provided 30 minutes of non-face-to-face time during this encounter.  HISTORY/CURRENT STATUS: HPI For routine med check.  Very stressed, husband has chronic seizures and has been out of work for 2 years. He had a seizure while eating the other night and a knife must have slipped off the table and stuck in his abdomen, she pulled it out and he bled a lot, he had to go to the hospital but fortunately nothing was seriously injured.  She was in the bedroom when it happened, she heard a big 'thud' and went to check on him, he was on the floor bleeding.  Her son in the [employer], has been dx with rare blood cancer, essential thrombosis 'or something like that' and fear of the unknown is hard,  her son and his wife are having troubles, she may not get to see her granddaughter again, she has chronic back pain. "I don't know how much more I can take. I'm tired of being strong for everybody."  He is extremely anxious all the time, feels panicky, "like what else is going to happen?"  The hydroxyzine and Klonopin help but she is having to take more hydroxyzine than she is prescribed.  Occasionally she will take an extra 1/2 pill of Klonopin per day as well.  Dreads going to work, which she used to enjoy. She can't work many hours due to disability but she did like to get out of the house a few hours a week.  Now all she wants to do is stay home and in her pajamas.  Low energy and motivation.  Appetite is normal.  ADLs are limited to the absolute necessities.  Personal hygiene is normal.  Not sleeping well, does not feel rested when she gets up.  Denies suicidal or homicidal thoughts.  Patient denies increased energy with decreased need for sleep, increased talkativeness, racing thoughts, impulsivity or risky behaviors, increased spending, increased libido, grandiosity, increased irritability or anger, paranoia, or hallucinations.  Denies dizziness, syncope, seizures, numbness, tingling, tremor, tics, unsteady gait, slurred speech, confusion. Denies muscle or joint pain, stiffness, or dystonia. Denies unexplained weight loss, frequent infections, or sores that heal slowly.  No polyphagia, polydipsia, or polyuria. Denies visual changes or paresthesias.   Individual Medical History/ Review of Systems: Changes? :No  Past medications for mental health diagnoses include: Prozac, Paxil, Lexapro, Celexa, Zoloft, Viibryd, Effexor, Cymbalta, Risperdal, Latuda, Lamictal, VPA, Lithium-became toxic, Xanax, Wellbutrin, Abilify, Thea Silversmith, Strattera, Kapvay, Saphris, Adderall, Vyvanse, Lunesta, Sonata,  Vraylar, Seroquel, Geodon, Tegretal  Allergies: Iohexol, Rosuvastatin, and Diatrizoate  Current Medications:   Current Outpatient Medications:    acetaminophen (TYLENOL) 500 MG tablet, Take 1,000 mg by mouth every 6 (six) hours as needed for moderate pain., Disp: , Rfl:    albuterol (PROVENTIL) (2.5 MG/3ML) 0.083% nebulizer solution, , Disp: , Rfl:    albuterol (VENTOLIN HFA) 108 (90 Base) MCG/ACT inhaler, Inhale 2 puffs into the lungs every 6 (six) hours as needed for wheezing or shortness of breath., Disp: 18 g, Rfl: 1   aspirin EC 81 MG tablet, Take 1 tablet (81 mg total) by mouth daily. Swallow whole., Disp: 30 tablet, Rfl: 11   buPROPion (WELLBUTRIN XL) 150 MG 24 hr tablet, Take 3 tablets (450 mg total) by mouth daily., Disp: 270 tablet, Rfl: 3   cetirizine (ZYRTEC) 10 MG tablet, Take 10 mg by mouth daily., Disp: , Rfl:    clonazePAM (KLONOPIN) 1 MG tablet, 1 p.o. 3 times daily as needed anxiety and may repeat 1/2 pill during the middle of the day as needed anxiety.  No more than 3.5 mg/day., Disp: 105 tablet, Rfl: 5   fenofibrate (TRICOR) 145 MG tablet, Take 1 tablet (145 mg total) by mouth daily., Disp: 90 tablet, Rfl: 3   gabapentin (NEURONTIN) 300 MG capsule, Take 300 mg by mouth 3 (three) times daily., Disp: , Rfl:    hydrocortisone (ANUSOL-HC) 2.5 % rectal cream, Place 1 application rectally 2-4 times daily for rectal bleeding/burning related to hemorrhoids., Disp: 30 g, Rfl: 1   lamoTRIgine (LAMICTAL) 200 MG tablet, TAKE 2 & 1/2 (TWO & ONE-HALF) TABLETS BY MOUTH ONCE DAILY, Disp: 225 tablet, Rfl: 3   levocetirizine (XYZAL) 5 MG tablet, Take 5 mg by mouth every evening., Disp: , Rfl:    loperamide (IMODIUM A-D) 2 MG tablet, Take 1 tablet (2 mg total) by mouth 4 (four) times daily as needed for diarrhea or loose stools., Disp: 40 tablet, Rfl: 0   meclizine (ANTIVERT) 25 MG tablet, Take 25 mg by mouth 3 (three) times daily as needed for dizziness., Disp: , Rfl:    metFORMIN (GLUCOPHAGE-XR) 750 MG 24 hr tablet, Take 750 mg by mouth daily., Disp: , Rfl:    ondansetron (ZOFRAN-ODT) 8 MG disintegrating  tablet, Take 1 tablet (8 mg total) by mouth 2 (two) times daily as needed for nausea or vomiting., Disp: 60 tablet, Rfl: 5   Oxcarbazepine (TRILEPTAL) 300 MG tablet, Take 3 tablets (900 mg total) by mouth at bedtime., Disp: 270 tablet, Rfl: 3   pantoprazole (PROTONIX) 40 MG tablet, TAKE 1 TABLET BY MOUTH TWICE DAILY WITH A MEAL (Patient taking differently: Take 40 mg by mouth daily.), Disp: 180 tablet, Rfl: 2   Peppermint Oil (IBGARD) 90 MG CPCR, Take as directed, Disp: 12 capsule, Rfl: 0   rosuvastatin (CRESTOR) 5 MG tablet, Take 1 tablet (5 mg total) by mouth daily., Disp: 90 tablet, Rfl: 3   Semaglutide, 1 MG/DOSE, 4 MG/3ML SOPN, Inject 1 mg into the skin once a week., Disp: 3 mL, Rfl: 11   sertraline (ZOLOFT) 100 MG tablet, TAKE 3 TABLETS BY MOUTH AT BEDTIME, Disp: 90 tablet, Rfl: 11   Tenapanor HCl (IBSRELA) 50 MG TABS, Take 50 mg by mouth 2 (two) times daily., Disp: 60 tablet, Rfl: 3   busPIRone (  BUSPAR) 15 MG tablet, Take 2 tablets (30 mg total) by mouth 2 (two) times daily., Disp: 270 tablet, Rfl: 3   hydrOXYzine (ATARAX) 50 MG tablet, Take 1 tablet (50 mg total) by mouth every 6 (six) hours as needed., Disp: 120 tablet, Rfl: 0 Medication Side Effects:  Sweating  Family Medical/ Social History: Changes? See HPI  MENTAL HEALTH EXAM:  There were no vitals taken for this visit.There is no height or weight on file to calculate BMI.  General Appearance: Casual and Fairly Groomed  Eye Contact:  Good  Speech:  Clear and Coherent and Normal Rate  Volume:  Normal  Mood:  Anxious and Depressed  Affect:  Depressed and Anxious  Thought Process:  Goal Directed and Descriptions of Associations: Circumstantial  Orientation:  Full (Time, Place, and Person)  Thought Content: Logical   Suicidal Thoughts:  No  Homicidal Thoughts:  No  Memory:  WNL  Judgement:  Good  Insight:  Good  Psychomotor Activity:  Normal  Concentration:  Concentration: Good and Attention Span: Good  Recall:  Good  Fund  of Knowledge: Good  Language: Good  Assets:  Desire for Improvement  ADL's:  Intact  Cognition: WNL  Prognosis:  Good   Gene site test results are under labs dated 04/03/2021.  DIAGNOSES:    ICD-10-CM   1. Situational mixed anxiety and depressive disorder  F43.23     2. Insomnia due to other mental disorder  F51.05    F99     3. Bipolar I disorder (HCC)  F31.9     4. Restless legs  G25.81     5. Polypharmacy  Z79.899       Receiving Psychotherapy: Yes    Vick Frees  RECOMMENDATIONS:  PDMP was reviewed.  Last Klonopin filled  I provided 30 minutes of non-face-to-face time during this encounter, including time spent before and after the visit in records review, medical decision making, counseling pertinent to today's visit, and charting.   Recommend increasing Buspar and Hydroxyzine. May need to add another med for depression but anxiety needs to be addressed first.   Continue Wellbutrin XL 150 mg, 3 p.o. every morning. Increase Buspar 15 mg, 2 po twice daily. Continue Klonopin 1 mg 1 p.o. 3 times daily as needed, with occasional extra one half p.o. midday as needed. Increase hydroxyzine to 50 mg, 1 p.o. every 6 hours as needed anxiety.  Continue Lamictal 200 mg, 2.5 pills daily. Continue Trileptal 300 mg, 3 nightly. Continue Zoloft 100 mg, 3 p.o. daily. Continue counseling with Vick Frees.  Return in 4 weeks.   Melony Overly, PA-C

## 2022-06-12 LAB — JAK2 (INCLUDING V617F AND EXON 12), MPL,& CALR-NEXT GEN SEQ

## 2022-06-25 ENCOUNTER — Inpatient Hospital Stay: Payer: Medicare Other | Attending: Hematology | Admitting: Hematology

## 2022-06-25 DIAGNOSIS — Z808 Family history of malignant neoplasm of other organs or systems: Secondary | ICD-10-CM | POA: Diagnosis not present

## 2022-06-25 DIAGNOSIS — Z809 Family history of malignant neoplasm, unspecified: Secondary | ICD-10-CM | POA: Insufficient documentation

## 2022-06-25 DIAGNOSIS — Z825 Family history of asthma and other chronic lower respiratory diseases: Secondary | ICD-10-CM | POA: Insufficient documentation

## 2022-06-25 DIAGNOSIS — Z807 Family history of other malignant neoplasms of lymphoid, hematopoietic and related tissues: Secondary | ICD-10-CM | POA: Diagnosis not present

## 2022-06-25 DIAGNOSIS — Z87891 Personal history of nicotine dependence: Secondary | ICD-10-CM | POA: Insufficient documentation

## 2022-06-25 DIAGNOSIS — D75839 Thrombocytosis, unspecified: Secondary | ICD-10-CM | POA: Insufficient documentation

## 2022-06-25 DIAGNOSIS — Z833 Family history of diabetes mellitus: Secondary | ICD-10-CM | POA: Diagnosis not present

## 2022-06-25 DIAGNOSIS — Z818 Family history of other mental and behavioral disorders: Secondary | ICD-10-CM | POA: Diagnosis not present

## 2022-06-25 DIAGNOSIS — Z8261 Family history of arthritis: Secondary | ICD-10-CM | POA: Diagnosis not present

## 2022-06-25 DIAGNOSIS — Z79899 Other long term (current) drug therapy: Secondary | ICD-10-CM | POA: Insufficient documentation

## 2022-06-25 DIAGNOSIS — Z8249 Family history of ischemic heart disease and other diseases of the circulatory system: Secondary | ICD-10-CM | POA: Diagnosis not present

## 2022-06-25 DIAGNOSIS — Z800 Family history of malignant neoplasm of digestive organs: Secondary | ICD-10-CM | POA: Insufficient documentation

## 2022-06-25 NOTE — Progress Notes (Signed)
HEMATOLOGY/ONCOLOGY PHONE-VISIT NOTE  Date of Service: 06/06/2022  Patient Care Team: Kristen [name] as PCP - General (Physician Assistant) Kristen [name], RD as Dietitian (Family Medicine) Kristen Gauss [name], MD as Consulting Physician (Gastroenterology)  CHIEF COMPLAINTS/PURPOSE OF CONSULTATION:  Evaluation and management of thrombocytosis.   HISTORY OF PRESENTING ILLNESS:   Kristen [name] is a wonderful 52 y.o. female who has been referred to [place] by Kristen [name], [place] for evaluation and management of thrombocytosis.  Today, she is accompanied by her husband.  She denies any hx of blood clots in the past. She notes that she had thrombocytosis in 2021 and it was recommended to proceed with a bone marrow biopsy.  She reports that she does endorse a protruding disc and does experience constant major pain in her thighs, but denies any pain in her back. To improve symptoms, she down lie down on a pillow. She will be seen by a neurosurgeon in May 2024. She does take Gabapentin but is unsure if it improves symptoms.   She's notes that she has had three previous surgeries in her neck. Patient denies any history of heart attack or stroke-like symptoms. She reports that in 2022-2023 she has had 5 UTIs. Patient's most recent diverticulitis episode was 2023. She denies any other recent infections. She reports that it has been greater than 20 years since she has last smoked.  She reports that her TSH was elevated previously. She denies any family history of thyroid disease. Patient does receive colonoscopies regularly, and her most recent was in 2023. She reports that polyps were found that have been removed.   She does complain of a left-sided headache during mornings. She does not see a neurologist for this. She also complains of constant dizziness which she describes as both vertigo and sense of imbalance. She report that she sometimes walks to the side. She also complains of  worsened fatigue over the last couple of months.   She takes Zofran frequently for nausea. She notes that if she consumes food at night, her nausea is worsened. Patient does take 75 micrograms Synthroid regularly. She notes that she has been on Ozempic for about a year, and her weight has decreased from 250 to 183 pounds. She has been on Protonix for several years, but notes that her dose has been adjusted to once daily.  She reports that she was infected with COVID-29 November 2021 and has struggled with persistent coughing since her infection. She reports that she takes allergy medication daily due to persistent coughing.  She does endorse hemorrhoids and constipation, but she does take medication which manages symptoms.   She reports having stress for the past two years which she attributes to her husband having frequent seizures and her son having platelet issues.  In regards to family history, she reports that her mother did have non-hodgkin's lymphoma on three occasions. The first of which was B cell lymphoma, and the second and third presented in her mother's throat. Her mother was not a smoker. She notes that her mother passed away from diverticulosis and sepsis. She reports that her son does endorse the JAK-2 mutation and essential thrombocytosis.   She did have previous genetic testing due to FHx of colon cancer. She reports that her father did have colon cancer in his late 84s, Colon cancer was also endorsed by her paternal aunt, and paternal grandma in her 6s. She reports that her paternal grandfather did have bone cancer but is  unsure of the exact type. She notes that her father did have multiple myeloma.  She does report pain in the abdomen on palpation. She denies any lower abdominal pain. The pain is not present without palpation. Her husband reports that an occasional soft-ball sized knot does protrude. The knot is present when standing any lying down.   Patient denies any bone pain,  abnormal bleeding, leg swelling or rashes. She notes that she does bruise easily in her lower extremities and is on Aspirin.   INTERVAL HISTORY: Kristen [name] is a wonderful 52 y.o. female who is connected via phone for continued evaluation and management of thrombocytosis. Patient was initially seen by me on 06/06/2022.  .I connected with Kristen [name] on 06/25/2022 at  3:30 PM EDT by telephone visit and verified that I am speaking with the correct person using two identifiers.   Patient reports she has been doing well overall without any new or severe medical concerns since our last visit. She denies fever, chills, night sweats, unexpected weight loss, abdominal pain, chest pain, back pain, or leg swelling.   Discussed lab results from 06/06/2022 in detail with the patient.   I discussed the limitations, risks, security and privacy concerns of performing an evaluation and management service by telemedicine and the availability of in-person appointments. I also discussed with the patient that there may be a patient responsible charge related to this service. The patient expressed understanding and agreed to proceed.   Other persons participating in the visit and their role in the encounter: None   Patient's location: Home  Provider's location: [facility]   Chief Complaint: thrombocytosis     MEDICAL HISTORY:  Past Medical History:  Diagnosis Date   Allergies    Anemia    Anxiety    Arthritis    Atypical mole 11/03/2006   mid upper back (slight to moderate)   Atypical mole 11/03/2006   lower right back (slight to moderate)   Back pain    Bipolar 1 disorder (HCC)    Borderline diabetic    Chronic constipation    Depression    Diabetes (HCC)    Diverticulitis    Family history of adverse reaction to anesthesia    mother-- ponv   Fatty liver    GAD (generalized anxiety disorder)    GERD (gastroesophageal reflux disease)    Hiatal hernia    High triglycerides    History of  kidney stones    History of recurrent UTIs    History of sepsis 06/2018   History of stomach ulcers    History of suicidal ideation    Hyperlipidemia    Hypothyroidism    IDA (iron deficiency anemia)    Joint pain    Melanoma (HCC) 11/03/2006   left chest in situ (excision)   Orthostasis    Palpitations    PONV (postoperative nausea and vomiting)    Rapid heart rate    Renal calculus, left    Seasonal allergies    Shortness of breath    Sleep disorder, unspecified    per excessive sleeping during the day   Snoring    Suicide ideation    Swallowing difficulty    Tachycardia    Vertigo    Vitamin D deficiency    Weakness    Wears contact lenses     SURGICAL HISTORY: Past Surgical History:  Procedure Laterality Date   ANTERIOR CERVICAL DECOMP/DISCECTOMY FUSION  02/17/2012   Procedure: ANTERIOR CERVICAL DECOMPRESSION/DISCECTOMY FUSION 2  LEVELS;  Surgeon: Hewitt Shorts, MD;  Location: [facility];  Service: Neurosurgery;  Laterality: N/A;  Cervical four-five and Cervical six-seven anterior cervial decompression with fusion plating and bonegraft   ANTERIOR CERVICAL DECOMP/DISCECTOMY FUSION  04-01-2001   @[facility]    C5---6   BIOPSY  07/20/2018   Procedure: BIOPSY;  Surgeon: Corbin Ade, MD;  Location: [facility];  Service: Endoscopy;;  gastric    BREAST REDUCTION SURGERY Bilateral 1995   CESAREAN SECTION  x2   last one 1998   with BILATERAL TUBAL LIGATION   CESAREAN SECTION WITH BILATERAL TUBAL LIGATION     COLONOSCOPY WITH PROPOFOL N/A 07/20/2018   Dr. Jena Gauss: Diverticulosis, internal hemorrhoids, 5 mm splenic flexure tubular adenoma removed. Repeat in 5 years due to family history.   COLONOSCOPY WITH PROPOFOL N/A 12/07/2020   Procedure: COLONOSCOPY WITH PROPOFOL;  Surgeon: Corbin Ade, MD;  Location: [facility];  Service: Endoscopy;  Laterality: N/A;  2:30pm   CYSTOSCOPY/URETEROSCOPY/HOLMIUM LASER/STENT PLACEMENT Left 08/21/2018   Procedure: CYSTOSCOPY LEFT  RETROGRADE PYELOGRAM /URETEROSCOPY/HOLMIUM LASER/STENT PLACEMENT;  Surgeon: Rene Paci, MD;  Location: [facility];  Service: Urology;  Laterality: Left;   DILATION AND CURETTAGE OF UTERUS  1992   for miscarriage   ENDOMETRIAL ABLATION  2014   ESOPHAGOGASTRODUODENOSCOPY     20 years ago.    ESOPHAGOGASTRODUODENOSCOPY (EGD) WITH PROPOFOL N/A 07/20/2018   Dr. Jena Gauss: Small hiatal hernia, erythematous mucosa in the stomach with a healing gastric ulcer measuring 1 cm, biopsies negative.   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   POLYPECTOMY  07/20/2018   Procedure: POLYPECTOMY;  Surgeon: Corbin Ade, MD;  Location: [facility];  Service: Endoscopy;;   POSTERIOR CERVICAL FUSION/FORAMINOTOMY N/A 08/18/2013   Procedure: CERVICAL FOUR TO CERVICAL SEVEN POSTERIOR CERVICAL FUSION/FORAMINOTOMY LEVEL 3;  Surgeon: Hewitt Shorts, MD;  Location: [facility];  Service: Neurosurgery;  Laterality: N/A;  C4-7 posterior cervical fusion with lateral mass fixation   RIGHT/LEFT HEART CATH AND CORONARY ANGIOGRAPHY N/A 12/23/2019   Procedure: RIGHT/LEFT HEART CATH AND CORONARY ANGIOGRAPHY;  Surgeon: Kathleene Hazel, MD;  Location: [facility];  Service: Cardiovascular;  Laterality: N/A;    SOCIAL HISTORY: Social History   Socioeconomic History   Marital status: Married    Spouse name: Not on file   Number of children: 2   Years of education: Not on file   Highest education level: Not on file  Occupational History   Occupation:  disability  Tobacco Use   Smoking status: Former    Packs/day: 1.00    Years: 5.00    Additional pack years: 0.00    Total pack years: 5.00    Types: Cigarettes    Quit date: 02/09/2002    Years since quitting: 20.3    Passive exposure: Past   Smokeless tobacco: Never  Vaping Use   Vaping Use: Never used  Substance and Sexual Activity   Alcohol use: No   Drug use: No   Sexual activity: Yes    Partners: Male  Birth control/protection: Surgical    Comment: spouse  Other Topics Concern   Not on file  Social History Narrative   Step brother-2   Step sister-2   Half sister -1 healthy   Social Determinants of Health   Financial Resource Strain: Low Risk  (07/10/2018)   Overall Financial Resource Strain (CARDIA)    Difficulty of Paying Living Expenses: Not very hard  Food Insecurity: No Food Insecurity (07/10/2018)   Hunger Vital Sign    Worried About Running Out of Food in the Last Year: Never true    Ran Out of Food in the Last Year: Never true  Transportation Needs: No Transportation Needs (07/10/2018)   PRAPARE - Administrator, Civil Service (Medical): No    Lack of Transportation (Non-Medical): No  Physical Activity: Unknown (07/10/2018)   Exercise Vital Sign    Days of Exercise per Week: Patient declined    Minutes of Exercise per Session: Patient declined  Stress: Stress Concern Present (07/10/2018)   [employer] of Occupational Health - Occupational Stress Questionnaire    Feeling of Stress : Rather much  Social Connections: Unknown (07/10/2018)   Social Connection and Isolation Panel [NHANES]    Frequency of Communication with [name] and Family: Patient declined    Frequency of Social Gatherings with [name] and Family: Patient declined    Attends Religious Services: Patient declined    Database administrator or Organizations: Patient declined    Attends Banker Meetings: Patient declined    Marital Status: Patient declined   Intimate Partner Violence: Not At Risk (07/10/2018)   Humiliation, Afraid, Rape, and Kick questionnaire    Fear of Current or Ex-Partner: No    Emotionally Abused: No    Physically Abused: No    Sexually Abused: No    FAMILY HISTORY: Family History  Problem Relation Age of Onset   Asthma Mother    Rheum arthritis Mother    Non-Hodgkin's lymphoma Mother    Congestive Heart Failure Mother    Atrial fibrillation Mother    Diabetes Mother    Hyperlipidemia Mother    Cancer Mother    Colon cancer Father    Multiple myeloma Father    Hypertension Father    Cancer Father    Depression Father    Anxiety disorder Father    Alcoholism Father    Obesity Father    Diabetes Maternal Grandmother    Congestive Heart Failure Maternal Grandmother    Mental illness Paternal Grandmother    Colon cancer Paternal Grandfather    Bone cancer Paternal Grandfather    Allergies Other        entire family(mom and dad)   Liver disease Neg Hx     ALLERGIES:  is allergic to iohexol, rosuvastatin, and diatrizoate.  MEDICATIONS:  Current Outpatient Medications  Medication Sig Dispense Refill   acetaminophen (TYLENOL) 500 MG tablet Take 1,000 mg by mouth every 6 (six) hours as needed for moderate pain.     albuterol (PROVENTIL) (2.5 MG/3ML) 0.083% nebulizer solution      albuterol (VENTOLIN HFA) 108 (90 Base) MCG/ACT inhaler Inhale 2 puffs into the lungs every 6 (six) hours as needed for wheezing or shortness of breath. 18 g 1   aspirin EC 81 MG tablet Take 1 tablet (81 mg total) by mouth daily. Swallow whole. 30 tablet 11   buPROPion (WELLBUTRIN XL) 150 MG 24 hr tablet Take 3 tablets (450 mg total) by mouth daily. 270 tablet 3  busPIRone (BUSPAR) 15 MG tablet Take 2 tablets (30 mg total) by mouth 2 (two) times daily. 270 tablet 3   cetirizine (ZYRTEC) 10 MG tablet Take 10 mg by mouth daily.     clonazePAM (KLONOPIN) 1 MG tablet 1 p.o. 3 times daily as needed anxiety and may repeat 1/2 pill during  the middle of the day as needed anxiety.  No more than 3.5 mg/day. 105 tablet 5   fenofibrate (TRICOR) 145 MG tablet Take 1 tablet (145 mg total) by mouth daily. 90 tablet 3   gabapentin (NEURONTIN) 300 MG capsule Take 300 mg by mouth 3 (three) times daily.     hydrocortisone (ANUSOL-HC) 2.5 % rectal cream Place 1 application rectally 2-4 times daily for rectal bleeding/burning related to hemorrhoids. 30 g 1   hydrOXYzine (ATARAX) 50 MG tablet Take 1 tablet (50 mg total) by mouth every 6 (six) hours as needed. 120 tablet 0   lamoTRIgine (LAMICTAL) 200 MG tablet TAKE 2 & 1/2 (TWO & ONE-HALF) TABLETS BY MOUTH ONCE DAILY 225 tablet 3   levocetirizine (XYZAL) 5 MG tablet Take 5 mg by mouth every evening.     loperamide (IMODIUM A-D) 2 MG tablet Take 1 tablet (2 mg total) by mouth 4 (four) times daily as needed for diarrhea or loose stools. 40 tablet 0   meclizine (ANTIVERT) 25 MG tablet Take 25 mg by mouth 3 (three) times daily as needed for dizziness.     metFORMIN (GLUCOPHAGE-XR) 750 MG 24 hr tablet Take 750 mg by mouth daily.     ondansetron (ZOFRAN-ODT) 8 MG disintegrating tablet Take 1 tablet (8 mg total) by mouth 2 (two) times daily as needed for nausea or vomiting. 60 tablet 5   Oxcarbazepine (TRILEPTAL) 300 MG tablet Take 3 tablets (900 mg total) by mouth at bedtime. 270 tablet 3   pantoprazole (PROTONIX) 40 MG tablet TAKE 1 TABLET BY MOUTH TWICE DAILY WITH A MEAL (Patient taking differently: Take 40 mg by mouth daily.) 180 tablet 2   Peppermint Oil (IBGARD) 90 MG CPCR Take as directed 12 capsule 0   rosuvastatin (CRESTOR) 5 MG tablet Take 1 tablet (5 mg total) by mouth daily. 90 tablet 3   Semaglutide, 1 MG/DOSE, 4 MG/3ML SOPN Inject 1 mg into the skin once a week. 3 mL 11   sertraline (ZOLOFT) 100 MG tablet TAKE 3 TABLETS BY MOUTH AT BEDTIME 90 tablet 11   Tenapanor HCl (IBSRELA) 50 MG TABS Take 50 mg by mouth 2 (two) times daily. 60 tablet 3   No current facility-administered medications  for this visit.    REVIEW OF SYSTEMS:    10 Point review of Systems was done is negative except as noted above.  PHYSICAL EXAMINATION: Telemedicine visit.  LABORATORY DATA:  I have reviewed the data as listed  .    Latest Ref Rng & Units 06/06/2022   10:29 AM 12/25/2021    9:21 AM 12/17/2021   11:28 PM  CBC  WBC 4.0 - 10.5 K/uL 7.8  12.0  15.4   Hemoglobin 12.0 - 15.0 g/dL 16.1  09.6  04.5   Hematocrit 36.0 - 46.0 % 36.2  37.9  37.1   Platelets 150 - 400 K/uL 445  458  424     .    Latest Ref Rng & Units 06/06/2022   10:29 AM 12/25/2021    9:21 AM 12/17/2021   11:28 PM  CMP  Glucose 70 - 99 mg/dL 77  409  83  BUN 6 - 20 mg/dL 20  18  21    Creatinine 0.44 - 1.00 mg/dL 3.66  4.40  3.47   Sodium 135 - 145 mmol/L 143  142  139   Potassium 3.5 - 5.1 mmol/L 3.9  3.6  3.4   Chloride 98 - 111 mmol/L 108  109  104   CO2 22 - 32 mmol/L 29  25  26    Calcium 8.9 - 10.3 mg/dL 42.5  9.0  9.2   Total Protein 6.5 - 8.1 g/dL 7.2  6.9  7.6   Total Bilirubin 0.3 - 1.2 mg/dL 0.4  0.2  0.6   Alkaline Phos 38 - 126 U/L 63  107  111   AST 15 - 41 U/L 24  21  20    ALT 0 - 44 U/L 17  23  25     05/22/2022 CBC      CMP 05/22/2022:     RADIOGRAPHIC STUDIES: I have personally reviewed the radiological images as listed and agreed with the findings in the report. No results found.  ASSESSMENT & PLAN:   Wonderful 52 y.o. female with:  Thrombocytosis - r/o Essential Thrombocytosis. Son has high platelets as well.  PLAN: -Discussed lab results from 06/06/2022 with the patient. CBC shows elevated platelets at 445 K. CMP shows slightly elevated creatine at 1.05. LDH in the normal range at 171. Vitamin B-12 level at 347. Ferritin level at 134. T4 level at 0.94. TSH at 1.580. Patient is not anemic or iron deficient.  -Discussed JAK-2 mutation/MPN mutation panel results from 06/06/2022 with the patient. Mutation is not detected. -Labs and mutation testing does not show bone marrow  problem.  -Patient has no overt symptoms related to thrombocytosis at this time -answered all of patients questions in detail    FOLLOW-UP: RTC with PCP  The total time spent in the appointment was 15 minutes* .  All of the patient's questions were answered with apparent satisfaction. The patient knows to call the clinic with any problems, questions or concerns.   Wyvonnia Lora MD MS AAHIVMS [facility] Hematology/Oncology Physician [facility]  .*Total Encounter Time as defined by the Centers for Medicare and Medicaid Services includes, in addition to the face-to-face time of a patient visit (documented in the note above) non-face-to-face time: obtaining and reviewing outside history, ordering and reviewing medications, tests or procedures, care coordination (communications with other health care professionals or caregivers) and documentation in the medical record.   I, Ok Edwards, am acting as a Neurosurgeon for Wyvonnia Lora, MD. .I have reviewed the above documentation for accuracy and completeness, and I agree with the above. Johney Maine MD

## 2022-06-26 ENCOUNTER — Ambulatory Visit: Payer: Self-pay | Admitting: Physical Therapy

## 2022-06-27 ENCOUNTER — Other Ambulatory Visit: Payer: Self-pay | Admitting: Physician Assistant

## 2022-07-04 ENCOUNTER — Encounter: Payer: Self-pay | Admitting: Hematology

## 2022-07-06 ENCOUNTER — Other Ambulatory Visit: Payer: Self-pay | Admitting: Physician Assistant

## 2022-07-07 ENCOUNTER — Other Ambulatory Visit: Payer: Self-pay | Admitting: Physician Assistant

## 2022-07-11 ENCOUNTER — Telehealth: Payer: Medicare Other | Admitting: Physician Assistant

## 2022-07-21 ENCOUNTER — Other Ambulatory Visit: Payer: Self-pay | Admitting: Gastroenterology

## 2022-07-25 ENCOUNTER — Telehealth (INDEPENDENT_AMBULATORY_CARE_PROVIDER_SITE_OTHER): Payer: 59 | Admitting: Physician Assistant

## 2022-07-25 ENCOUNTER — Encounter: Payer: Self-pay | Admitting: Physician Assistant

## 2022-07-25 DIAGNOSIS — Z1589 Genetic susceptibility to other disease: Secondary | ICD-10-CM

## 2022-07-25 DIAGNOSIS — Z6379 Other stressful life events affecting family and household: Secondary | ICD-10-CM | POA: Diagnosis not present

## 2022-07-25 DIAGNOSIS — F319 Bipolar disorder, unspecified: Secondary | ICD-10-CM | POA: Diagnosis not present

## 2022-07-25 DIAGNOSIS — R454 Irritability and anger: Secondary | ICD-10-CM

## 2022-07-25 DIAGNOSIS — F411 Generalized anxiety disorder: Secondary | ICD-10-CM

## 2022-07-25 MED ORDER — L-METHYLFOLATE-B6-B12 3-35-2 MG PO TABS: 1.0000 | ORAL_TABLET | Freq: Every day | ORAL | 1 refills | Status: DC

## 2022-07-25 NOTE — Progress Notes (Signed)
[facility]  Patient ID: Kristen [name],  MRN: [id number]  PCP: Exie Parody  Date of Evaluation: 07/25/2022 Time spent:30 minutes  Chief Complaint:  Chief Complaint   Anxiety; Follow-up    Virtual Visit via Telehealth  I connected with patient by a video enabled telemedicine application with their informed consent, and verified patient privacy and that I am speaking with the correct person using two identifiers.  I am private, in my office and the patient is at home.  I discussed the limitations, risks, security and privacy concerns of performing an evaluation and management service by video and the availability of in person appointments. I also discussed with the patient that there may be a patient responsible charge related to this service. The patient expressed understanding and agreed to proceed.   I discussed the assessment and treatment plan with the patient. The patient was provided an opportunity to ask questions and all were answered. The patient agreed with the plan and demonstrated an understanding of the instructions.   The patient was advised to call back or seek an in-person evaluation if the symptoms worsen or if the condition fails to improve as anticipated.  I provided 30 minutes of non-face-to-face time during this encounter.  HISTORY/CURRENT STATUS: HPI For routine med check.  Still stressed. Had the staph infection on lip. Her husband still has seizures, going to another neurologist in a few weeks, her son and DIL are getting a divorce, her son has a 'rare form of blood cancer' but states it's treatable and life expectancy is normal. Feels numb to life. She didn't increase the Buspar as we dicussed b/c the pharmacy only had to take it tid on her medicine bottle.  Taking the hydroxyzine higher dose has not been very helpful.  States she is too stressed to enjoy anything.  She does not cry though.  Feels like if she does it will open up the  floodgates and she want to be the strong person in the family which she needs to be.  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   Does have feelings of hopelessness.  Sleeps well most of the time. ADLs and personal hygiene are normal.   Focus, concentration and memory are the same.  Appetite has not changed.  Weight is stable.   Denies suicidal or homicidal thoughts.  She has been spending more lately but not major purchases.  Patient denies increased energy with decreased need for sleep, increased talkativeness, racing thoughts, impulsivity or risky behaviors, increased libido, grandiosity, increased irritability or anger, paranoia, or hallucinations.  Denies dizziness, syncope, seizures, numbness, tingling, tremor, tics, unsteady gait, slurred speech, confusion.  Complains of left leg pain.  She is having that evaluated, denies stiffness, or dystonia. Denies unexplained weight loss, frequent infections, or sores that heal slowly.  No polyphagia, polydipsia, or polyuria. Denies visual changes or paresthesias.   Individual Medical History/ Review of Systems: Changes? :Yes    had staph infection in upper lip, had severe pain, was on antibx and oxycodone. Went to ER for that, also had severe PA went to ER.   Past medications for mental health diagnoses include: Prozac, Paxil, Lexapro, Celexa, Zoloft, Viibryd, Effexor, Cymbalta, Risperdal, Latuda, Lamictal, VPA, Lithium-became toxic, Xanax, Wellbutrin, Abilify, Thea Silversmith, Strattera, Kapvay, Saphris, Adderall, Vyvanse, Lunesta, Sonata,  Vraylar, Seroquel, Geodon, Tegretal  Allergies: Iohexol, Rosuvastatin, and Diatrizoate  Current Medications:  Current Outpatient Medications:    acetaminophen (TYLENOL)  500 MG tablet, Take 1,000 mg by mouth every 6 (six) hours as needed for moderate pain., Disp: , Rfl:    albuterol (PROVENTIL) (2.5 MG/3ML) 0.083% nebulizer solution, , Disp: , Rfl:    albuterol (VENTOLIN HFA) 108 (90 Base) MCG/ACT  inhaler, Inhale 2 puffs into the lungs every 6 (six) hours as needed for wheezing or shortness of breath., Disp: 18 g, Rfl: 1   aspirin EC 81 MG tablet, Take 1 tablet (81 mg total) by mouth daily. Swallow whole., Disp: 30 tablet, Rfl: 11   buPROPion (WELLBUTRIN XL) 150 MG 24 hr tablet, Take 3 tablets (450 mg total) by mouth daily., Disp: 270 tablet, Rfl: 3   busPIRone (BUSPAR) 15 MG tablet, Take 2 tablets (30 mg total) by mouth 2 (two) times daily. (Patient taking differently: Take 15 mg by mouth 3 (three) times daily.), Disp: 270 tablet, Rfl: 3   cetirizine (ZYRTEC) 10 MG tablet, Take 10 mg by mouth daily., Disp: , Rfl:    clonazePAM (KLONOPIN) 1 MG tablet, 1 p.o. 3 times daily as needed anxiety and may repeat 1/2 pill during the middle of the day as needed anxiety.  No more than 3.5 mg/day., Disp: 105 tablet, Rfl: 5   fenofibrate (TRICOR) 145 MG tablet, Take 1 tablet (145 mg total) by mouth daily., Disp: 90 tablet, Rfl: 3   hydrocortisone (ANUSOL-HC) 2.5 % rectal cream, Place 1 application rectally 2-4 times daily for rectal bleeding/burning related to hemorrhoids., Disp: 30 g, Rfl: 1   hydrOXYzine (ATARAX) 50 MG tablet, TAKE 1 TABLET BY MOUTH EVERY 6 HOURS AS NEEDED, Disp: 120 tablet, Rfl: 0   lamoTRIgine (LAMICTAL) 200 MG tablet, TAKE 2 & 1/2 (TWO & ONE-HALF) TABLETS BY MOUTH ONCE DAILY, Disp: 225 tablet, Rfl: 0   levocetirizine (XYZAL) 5 MG tablet, Take 5 mg by mouth every evening., Disp: , Rfl:    loperamide (IMODIUM A-D) 2 MG tablet, Take 1 tablet (2 mg total) by mouth 4 (four) times daily as needed for diarrhea or loose stools., Disp: 40 tablet, Rfl: 0   meclizine (ANTIVERT) 25 MG tablet, Take 25 mg by mouth 3 (three) times daily as needed for dizziness., Disp: , Rfl:    metFORMIN (GLUCOPHAGE-XR) 750 MG 24 hr tablet, Take 750 mg by mouth daily., Disp: , Rfl:    ondansetron (ZOFRAN-ODT) 8 MG disintegrating tablet, Take 1 tablet (8 mg total) by mouth 2 (two) times daily as needed for nausea or  vomiting., Disp: 60 tablet, Rfl: 5   Oxcarbazepine (TRILEPTAL) 300 MG tablet, Take 3 tablets (900 mg total) by mouth at bedtime., Disp: 270 tablet, Rfl: 3   pantoprazole (PROTONIX) 40 MG tablet, TAKE 1 TABLET BY MOUTH TWICE DAILY WITH A MEAL (Patient taking differently: Take 40 mg by mouth daily.), Disp: 180 tablet, Rfl: 2   Peppermint Oil (IBGARD) 90 MG CPCR, Take as directed, Disp: 12 capsule, Rfl: 0   rosuvastatin (CRESTOR) 5 MG tablet, Take 1 tablet (5 mg total) by mouth daily., Disp: 90 tablet, Rfl: 3   Semaglutide, 1 MG/DOSE, 4 MG/3ML SOPN, Inject 1 mg into the skin once a week., Disp: 3 mL, Rfl: 11   sertraline (ZOLOFT) 100 MG tablet, TAKE 3 TABLETS BY MOUTH AT BEDTIME, Disp: 90 tablet, Rfl: 0   Tenapanor HCl (IBSRELA) 50 MG TABS, Take 1 tablet by mouth 2 (two) times daily., Disp: 60 tablet, Rfl: 5   gabapentin (NEURONTIN) 300 MG capsule, Take 300 mg by mouth 3 (three) times daily. (Patient not taking: Reported  on 07/25/2022), Disp: , Rfl:  Medication Side Effects:  Sweating  Family Medical/ Social History: Changes?   MENTAL HEALTH EXAM:  There were no vitals taken for this visit.There is no height or weight on file to calculate BMI.  General Appearance: Casual and Well Groomed  Eye Contact:  Good  Speech:  Clear and Coherent and Normal Rate  Volume:  Normal  Mood:   Sad  Affect:  Congruent  Thought Process:  Goal Directed and Descriptions of Associations: Circumstantial  Orientation:  Full (Time, Place, and Person)  Thought Content: Logical   Suicidal Thoughts:  No  Homicidal Thoughts:  No  Memory:  WNL  Judgement:  Good  Insight:  Good  Psychomotor Activity:  Normal  Concentration:  Concentration: Good and Attention Span: Good  Recall:  Good  Fund of Knowledge: Good  Language: Good  Assets:  Desire for Improvement  ADL's:  Intact  Cognition: WNL  Prognosis:  Good   Gene site test results are under labs dated 04/03/2021.  DIAGNOSES:    ICD-10-CM   1. Generalized  anxiety disorder  F41.1     2. Stress due to illness of family member  Z63.79     3. Irritability and anger  R45.4     4. MTHFR gene mutation  Z15.89     5. Bipolar I disorder with depression (HCC)  F31.9      Receiving Psychotherapy: Yes    Vick Frees every 2 weeks  RECOMMENDATIONS:  PDMP was reviewed.  Last Klonopin filled 07/03/2022.  Oxycodone filled 07/07/2022. I provided 30 minutes of non-face-to-face time during this encounter, including time spent before and after the visit in records review, medical decision making, counseling pertinent to today's visit, and charting.   Discussed her issues at length.  First of all I recommend increasing the BuSpar as we had planned at the last visit.  She has plenty of the 15 mg pills so she will increase to 2 p.o. twice daily until that prescription is gone and then I will send in 30 mg.  She verbalizes understanding and wrote down the instructions.  She really needs to be on Deplin or Metanx.  The Deplin was way too expensive.  We will see if her insurance will pay for the latter.  I do not think other medication changes are necessary right now.  Counseling is very important.  Continue Wellbutrin XL 150 mg, 3 p.o. every morning. Increase Buspar 15 mg, to 2 po twice daily. Continue Klonopin 1 mg 1 p.o. 3 times daily as needed, with occasional extra one half p.o. midday as needed. Continue hydroxyzine to 50 mg, 1 p.o. every 6 hours as needed anxiety.   Start Metanx bid. (Deplin was too expensive) Continue Lamictal 200 mg, 2.5 pills daily. Continue Trileptal 300 mg, 3 nightly. Continue Zoloft 100 mg, 3 p.o. daily. Continue counseling with Vick Frees.  Return in 6 weeks.   Melony Overly, PA-C

## 2022-08-05 ENCOUNTER — Other Ambulatory Visit: Payer: Self-pay | Admitting: Physician Assistant

## 2022-08-06 ENCOUNTER — Other Ambulatory Visit: Payer: Self-pay | Admitting: Physician Assistant

## 2022-08-22 ENCOUNTER — Telehealth: Payer: Self-pay | Admitting: Cardiology

## 2022-08-22 NOTE — Telephone Encounter (Signed)
Pt c/o medication issue:  1. Name of Medication:   Semaglutide, 1 MG/DOSE, 4 MG/3ML SOPN    2. How are you currently taking this medication (dosage and times per day)?   Inject 1 mg into the skin once a week.    3. Are you having a reaction (difficulty breathing--STAT)? No  4. What is your medication issue? Pt would like a callback from Pharmacist regarding medication, as she has questions. Please advise

## 2022-08-23 ENCOUNTER — Telehealth: Payer: Self-pay | Admitting: Cardiology

## 2022-08-23 MED ORDER — OZEMPIC (2 MG/DOSE) 8 MG/3ML ~~LOC~~ SOPN: 2.0000 mg | PEN_INJECTOR | SUBCUTANEOUS | 11 refills | Status: DC

## 2022-08-23 NOTE — Telephone Encounter (Signed)
I called pt, she states increased appetite, emotional eater and more stress in her life, wants to increase Ozempic dose. Rx sent in for 2mg  weekly dose.

## 2022-08-23 NOTE — Telephone Encounter (Signed)
Patient is asking that you give her a call. Please advise  

## 2022-08-23 NOTE — Telephone Encounter (Signed)
Pt initially called yesterday and wasn't called back. Second encounter opened when pt called in again today. I have spoken with pt, documented in other encounter.

## 2022-08-31 ENCOUNTER — Other Ambulatory Visit: Payer: Self-pay | Admitting: Physician Assistant

## 2022-09-01 ENCOUNTER — Other Ambulatory Visit: Payer: Self-pay | Admitting: Physician Assistant

## 2022-09-04 ENCOUNTER — Telehealth (INDEPENDENT_AMBULATORY_CARE_PROVIDER_SITE_OTHER): Payer: 59 | Admitting: Physician Assistant

## 2022-09-04 ENCOUNTER — Encounter: Payer: Self-pay | Admitting: Physician Assistant

## 2022-09-04 DIAGNOSIS — F411 Generalized anxiety disorder: Secondary | ICD-10-CM

## 2022-09-04 DIAGNOSIS — F319 Bipolar disorder, unspecified: Secondary | ICD-10-CM | POA: Diagnosis not present

## 2022-09-04 DIAGNOSIS — Z6379 Other stressful life events affecting family and household: Secondary | ICD-10-CM

## 2022-09-04 DIAGNOSIS — R454 Irritability and anger: Secondary | ICD-10-CM | POA: Diagnosis not present

## 2022-09-04 DIAGNOSIS — Z1589 Genetic susceptibility to other disease: Secondary | ICD-10-CM

## 2022-09-04 DIAGNOSIS — Z79899 Other long term (current) drug therapy: Secondary | ICD-10-CM

## 2022-09-04 DIAGNOSIS — F99 Mental disorder, not otherwise specified: Secondary | ICD-10-CM

## 2022-09-04 DIAGNOSIS — F5105 Insomnia due to other mental disorder: Secondary | ICD-10-CM | POA: Diagnosis not present

## 2022-09-04 MED ORDER — ALPRAZOLAM 1 MG PO TABS: 1.0000 mg | ORAL_TABLET | Freq: Four times a day (QID) | ORAL | 0 refills | Status: DC | PRN

## 2022-09-04 MED ORDER — GABAPENTIN 300 MG PO CAPS: 300.0000 mg | ORAL_CAPSULE | Freq: Three times a day (TID) | ORAL | Status: DC

## 2022-09-04 NOTE — Progress Notes (Unsigned)
[facility]  Patient ID: LENIA HOUSLEY,  MRN: [id number]  PCP: Rick Duff, PA-C  Date of Evaluation: 09/04/2022  time spent:30 minutes  Chief Complaint:  Chief Complaint   Anxiety; Depression; Follow-up    Virtual Visit via Telehealth  I connected with patient by a video enabled telemedicine application with their informed consent, and verified patient privacy and that I am speaking with the correct person using two identifiers.  I am private, in my office and the patient is at home.  I discussed the limitations, risks, security and privacy concerns of performing an evaluation and management service by video and the availability of in person appointments. I also discussed with the patient that there may be a patient responsible charge related to this service. The patient expressed understanding and agreed to proceed.   I discussed the assessment and treatment plan with the patient. The patient was provided an opportunity to ask questions and all were answered. The patient agreed with the plan and demonstrated an understanding of the instructions.   The patient was advised to call back or seek an in-person evaluation if the symptoms worsen or if the condition fails to improve as anticipated.  I provided 30 minutes of non-face-to-face time during this encounter.  HISTORY/CURRENT STATUS: HPI For routine med check.  States she has so much stress, she has no fuse, feels like she's in a room that's closing in. Husband still has seizures, they can't figure out what's wrong. She works a part-time job at [employer] a few hours a week b/c they need the money. Isn't able mentally to do more than that. Feels like she's going to 'lose it' any time. Temper is short. Doesn't have anyone to help her. One son lives with her and husband but he stays gone all the time and does nothing to help her. The other son is in military is in another state so can't help.  She cries easily.  Does  not enjoy much of anything.  Does not have the money to do anything if she wanted to.  Not sleeping well because of anxiety.  Does not feel like the Klonopin is helping anymore.  "I need something that helps me really fast."  She gets panicky a lot, has shortness of breath and feels palpitations sometimes for a few minutes but then anxiety calms down.  She does not drink caffeine.  ADLs and personal hygiene are normal.  Appetite is normal and weight is stable.  No suicidal or homicidal thoughts.  Patient denies increased energy with decreased need for sleep, increased talkativeness, racing thoughts, impulsivity or risky behaviors, increased spending, increased libido, grandiosity, increased irritability or anger, paranoia, or hallucinations.  Review of Systems  Constitutional:  Positive for malaise/fatigue.  HENT: Negative.    Eyes: Negative.   Respiratory:  Positive for shortness of breath.        When anxious  Cardiovascular:  Positive for palpitations.       When anxious  Gastrointestinal: Negative.   Genitourinary: Negative.   Musculoskeletal: Negative.   Skin: Negative.   Neurological: Negative.   Endo/Heme/Allergies: Negative.   Psychiatric/Behavioral:         See HPI   Individual Medical History/ Review of Systems: Changes? :No      Past medications for mental health diagnoses include: Prozac, Paxil, Lexapro, Celexa, Zoloft, Viibryd, Effexor, Cymbalta, Risperdal, Latuda, Lamictal, VPA, Lithium-became toxic, Xanax, Wellbutrin, Abilify, Thea Silversmith, Strattera, Kapvay, Saphris, Adderall, Vyvanse, Lunesta, Sonata,  Rule, Seroquel, [place],  Tegretal  Allergies: Iohexol, Rosuvastatin, and Diatrizoate  Current Medications:  Current Outpatient Medications:    acetaminophen (TYLENOL) 500 MG tablet, Take 1,000 mg by mouth every 6 (six) hours as needed for moderate pain., Disp: , Rfl:    albuterol (PROVENTIL) (2.5 MG/3ML) 0.083% nebulizer solution, , Disp: , Rfl:    albuterol (VENTOLIN HFA)  108 (90 Base) MCG/ACT inhaler, Inhale 2 puffs into the lungs every 6 (six) hours as needed for wheezing or shortness of breath., Disp: 18 g, Rfl: 1   ALPRAZolam (XANAX) 1 MG tablet, Take 1 tablet (1 mg total) by mouth 4 (four) times daily as needed for anxiety., Disp: 120 tablet, Rfl: 0   aspirin EC 81 MG tablet, Take 1 tablet (81 mg total) by mouth daily. Swallow whole., Disp: 30 tablet, Rfl: 11   buPROPion (WELLBUTRIN XL) 150 MG 24 hr tablet, Take 3 tablets (450 mg total) by mouth daily., Disp: 270 tablet, Rfl: 3   busPIRone (BUSPAR) 15 MG tablet, Take 2 tablets (30 mg total) by mouth 2 (two) times daily., Disp: 270 tablet, Rfl: 3   cetirizine (ZYRTEC) 10 MG tablet, Take 10 mg by mouth daily., Disp: , Rfl:    clonazePAM (KLONOPIN) 1 MG tablet, 1 p.o. 3 times daily as needed anxiety and may repeat 1/2 pill during the middle of the day as needed anxiety.  No more than 3.5 mg/day., Disp: 105 tablet, Rfl: 5   fenofibrate (TRICOR) 145 MG tablet, Take 1 tablet (145 mg total) by mouth daily., Disp: 90 tablet, Rfl: 3   hydrocortisone (ANUSOL-HC) 2.5 % rectal cream, Place 1 application rectally 2-4 times daily for rectal bleeding/burning related to hemorrhoids., Disp: 30 g, Rfl: 1   hydrOXYzine (ATARAX) 50 MG tablet, TAKE 1 TABLET BY MOUTH EVERY 6 HOURS AS NEEDED, Disp: 120 tablet, Rfl: 0   l-methylfolate-B6-B12 (METANX) 3-35-2 MG TABS tablet, Take 1 tablet by mouth daily., Disp: 30 tablet, Rfl: 1   lamoTRIgine (LAMICTAL) 200 MG tablet, TAKE 2 & 1/2 (TWO & ONE-HALF) TABLETS BY MOUTH ONCE DAILY, Disp: 225 tablet, Rfl: 0   levocetirizine (XYZAL) 5 MG tablet, Take 5 mg by mouth every evening., Disp: , Rfl:    loperamide (IMODIUM A-D) 2 MG tablet, Take 1 tablet (2 mg total) by mouth 4 (four) times daily as needed for diarrhea or loose stools., Disp: 40 tablet, Rfl: 0   meclizine (ANTIVERT) 25 MG tablet, Take 25 mg by mouth 3 (three) times daily as needed for dizziness., Disp: , Rfl:    metFORMIN (GLUCOPHAGE-XR)  750 MG 24 hr tablet, Take 750 mg by mouth daily., Disp: , Rfl:    ondansetron (ZOFRAN-ODT) 8 MG disintegrating tablet, Take 1 tablet (8 mg total) by mouth 2 (two) times daily as needed for nausea or vomiting., Disp: 60 tablet, Rfl: 5   Oxcarbazepine (TRILEPTAL) 300 MG tablet, Take 3 tablets (900 mg total) by mouth at bedtime., Disp: 270 tablet, Rfl: 3   pantoprazole (PROTONIX) 40 MG tablet, TAKE 1 TABLET BY MOUTH TWICE DAILY WITH A MEAL (Patient taking differently: Take 40 mg by mouth daily.), Disp: 180 tablet, Rfl: 2   Peppermint Oil (IBGARD) 90 MG CPCR, Take as directed, Disp: 12 capsule, Rfl: 0   rosuvastatin (CRESTOR) 5 MG tablet, Take 1 tablet (5 mg total) by mouth daily., Disp: 90 tablet, Rfl: 3   Semaglutide, 2 MG/DOSE, (OZEMPIC, 2 MG/DOSE,) 8 MG/3ML SOPN, Inject 2 mg into the skin once a week., Disp: 3 mL, Rfl: 11   sertraline (ZOLOFT)  100 MG tablet, TAKE 3 TABLETS BY MOUTH AT BEDTIME, Disp: 90 tablet, Rfl: 0   Tenapanor HCl (IBSRELA) 50 MG TABS, Take 1 tablet by mouth 2 (two) times daily., Disp: 60 tablet, Rfl: 5   gabapentin (NEURONTIN) 300 MG capsule, Take 1 capsule (300 mg total) by mouth 3 (three) times daily., Disp: , Rfl:  Medication Side Effects:  Sweating  Family Medical/ Social History: Changes?   MENTAL HEALTH EXAM:  There were no vitals taken for this visit.There is no height or weight on file to calculate BMI.  General Appearance: Casual and Well Groomed  Eye Contact:  Good  Speech:  Clear and Coherent and Normal Rate  Volume:  Normal  Mood:  Anxious, Hopeless, and Irritable  Affect:  Anxious  Thought Process:  Goal Directed and Descriptions of Associations: Circumstantial  Orientation:  Full (Time, Place, and Person)  Thought Content: Logical   Suicidal Thoughts:  No  Homicidal Thoughts:  No  Memory:  WNL  Judgement:  Good  Insight:  Good  Psychomotor Activity:  Normal  Concentration:  Concentration: Good and Attention Span: Good  Recall:  Good  Fund of  Knowledge: Good  Language: Good  Assets:  Desire for Improvement  ADL's:  Intact  Cognition: WNL  Prognosis:  Good   Gene site test results are under labs dated 04/03/2021.  DIAGNOSES:    ICD-10-CM   1. Generalized anxiety disorder  F41.1     2. Bipolar I disorder with depression (HCC)  F31.9     3. Irritability and anger  R45.4     4. MTHFR gene mutation  Z15.89     5. Stress due to illness of family member  Z63.79     6. Insomnia due to other mental disorder  F51.05    F99     7. Polypharmacy  Z79.899       Receiving Psychotherapy: Yes    Vick Frees every 2 weeks  RECOMMENDATIONS:  PDMP was reviewed.  Last Klonopin filled 08/07/2022.  Also on oxycodone and gabapentin. I provided 30 minutes of non-face-to-face time during this encounter, including time spent before and after the visit in records review, medical decision making, counseling pertinent to today's visit, and charting.   D/C Klonopin. (She's due for RF. Karin Lieu, CMA called her pharmacy and canceled RF) Will change to xanax b/c it works quicker. She knows she's on a high dose of Klonopin and I'll give the equiv and 0.5 mg more per day if needed. But this will most likely be the ceiling, we cannot continue to increase the dose. She understands.   Recommend restarting Gabapentin, for anxiety not pain. She has some at home already.  Restart Xanax 1 mg, 1 po qid prn.  Continue Wellbutrin XL 150 mg, 3 p.o. every morning. Continue Buspar 15 mg, to 2 po twice daily. Restart Gabapentin 300 mg, 1 po bid for 1 wk, then increase to tid if anxiety not better.  Continue hydroxyzine to 50 mg, 1 p.o. every 6 hours as needed anxiety.   Continue Metanx bid.   Continue Lamictal 200 mg, 2.5 pills daily. Continue Trileptal 300 mg, 3 nightly. Continue Zoloft 100 mg, 3 p.o. daily. Continue counseling with Vick Frees. Recommend increasing frequency to weekly or even more if Denny Peon sees fit.  Return in 4 weeks.   Melony Overly, PA-C

## 2022-09-17 ENCOUNTER — Telehealth: Payer: Self-pay | Admitting: Physician Assistant

## 2022-09-17 MED ORDER — BUSPIRONE HCL 15 MG PO TABS: 30.0000 mg | ORAL_TABLET | Freq: Two times a day (BID) | ORAL | 0 refills | Status: DC

## 2022-09-17 NOTE — Telephone Encounter (Signed)
New Rx sent for 2 tabs BID

## 2022-09-17 NOTE — Telephone Encounter (Signed)
Next appt is 10/16/22. Donnamarie called and tried getting a refill on her Buspar. The pharmacy told her that the previous directions were one 3 times twice a day and at last appt directions said two po twice daily. The pharmacy said they can't refill it because its too early to fill. Could someone place call the pharmacy and clarify.   [facility] [address], [place] - [place] [address]   Phone: [phone]  Fax: [phone]

## 2022-09-29 ENCOUNTER — Other Ambulatory Visit: Payer: Self-pay | Admitting: Physician Assistant

## 2022-10-01 ENCOUNTER — Ambulatory Visit: Payer: Medicare Other | Admitting: Adult Health

## 2022-10-06 ENCOUNTER — Other Ambulatory Visit: Payer: Self-pay | Admitting: Physician Assistant

## 2022-10-11 ENCOUNTER — Other Ambulatory Visit: Payer: Self-pay | Admitting: Physician Assistant

## 2022-10-16 ENCOUNTER — Ambulatory Visit (INDEPENDENT_AMBULATORY_CARE_PROVIDER_SITE_OTHER): Payer: Self-pay | Admitting: Physician Assistant

## 2022-10-16 DIAGNOSIS — Z91199 Patient's noncompliance with other medical treatment and regimen due to unspecified reason: Secondary | ICD-10-CM

## 2022-10-16 NOTE — Progress Notes (Signed)
No show

## 2022-10-18 ENCOUNTER — Inpatient Hospital Stay: Payer: 59 | Attending: Oncology | Admitting: Oncology

## 2022-10-18 ENCOUNTER — Encounter: Payer: Self-pay | Admitting: Oncology

## 2022-10-18 ENCOUNTER — Inpatient Hospital Stay: Payer: 59

## 2022-10-18 DIAGNOSIS — M79604 Pain in right leg: Secondary | ICD-10-CM | POA: Diagnosis not present

## 2022-10-18 DIAGNOSIS — M2550 Pain in unspecified joint: Secondary | ICD-10-CM | POA: Diagnosis not present

## 2022-10-18 DIAGNOSIS — R059 Cough, unspecified: Secondary | ICD-10-CM | POA: Diagnosis not present

## 2022-10-18 DIAGNOSIS — M542 Cervicalgia: Secondary | ICD-10-CM | POA: Diagnosis not present

## 2022-10-18 DIAGNOSIS — M549 Dorsalgia, unspecified: Secondary | ICD-10-CM | POA: Insufficient documentation

## 2022-10-18 DIAGNOSIS — M79605 Pain in left leg: Secondary | ICD-10-CM | POA: Diagnosis not present

## 2022-10-18 DIAGNOSIS — R5383 Other fatigue: Secondary | ICD-10-CM | POA: Diagnosis not present

## 2022-10-18 DIAGNOSIS — Z811 Family history of alcohol abuse and dependence: Secondary | ICD-10-CM | POA: Insufficient documentation

## 2022-10-18 DIAGNOSIS — R102 Pelvic and perineal pain: Secondary | ICD-10-CM | POA: Insufficient documentation

## 2022-10-18 DIAGNOSIS — Z808 Family history of malignant neoplasm of other organs or systems: Secondary | ICD-10-CM | POA: Insufficient documentation

## 2022-10-18 DIAGNOSIS — Z833 Family history of diabetes mellitus: Secondary | ICD-10-CM | POA: Diagnosis not present

## 2022-10-18 DIAGNOSIS — R519 Headache, unspecified: Secondary | ICD-10-CM | POA: Insufficient documentation

## 2022-10-18 DIAGNOSIS — R32 Unspecified urinary incontinence: Secondary | ICD-10-CM | POA: Diagnosis not present

## 2022-10-18 DIAGNOSIS — R42 Dizziness and giddiness: Secondary | ICD-10-CM | POA: Insufficient documentation

## 2022-10-18 DIAGNOSIS — D649 Anemia, unspecified: Secondary | ICD-10-CM

## 2022-10-18 DIAGNOSIS — Z8249 Family history of ischemic heart disease and other diseases of the circulatory system: Secondary | ICD-10-CM | POA: Diagnosis not present

## 2022-10-18 DIAGNOSIS — Z79899 Other long term (current) drug therapy: Secondary | ICD-10-CM | POA: Insufficient documentation

## 2022-10-18 DIAGNOSIS — K5900 Constipation, unspecified: Secondary | ICD-10-CM | POA: Diagnosis not present

## 2022-10-18 DIAGNOSIS — Z83438 Family history of other disorder of lipoprotein metabolism and other lipidemia: Secondary | ICD-10-CM | POA: Insufficient documentation

## 2022-10-18 DIAGNOSIS — G479 Sleep disorder, unspecified: Secondary | ICD-10-CM | POA: Insufficient documentation

## 2022-10-18 DIAGNOSIS — Z87891 Personal history of nicotine dependence: Secondary | ICD-10-CM | POA: Diagnosis not present

## 2022-10-18 DIAGNOSIS — D75839 Thrombocytosis, unspecified: Secondary | ICD-10-CM | POA: Diagnosis not present

## 2022-10-18 DIAGNOSIS — M791 Myalgia, unspecified site: Secondary | ICD-10-CM | POA: Insufficient documentation

## 2022-10-18 DIAGNOSIS — D72829 Elevated white blood cell count, unspecified: Secondary | ICD-10-CM | POA: Insufficient documentation

## 2022-10-18 DIAGNOSIS — R232 Flushing: Secondary | ICD-10-CM | POA: Insufficient documentation

## 2022-10-18 DIAGNOSIS — Z800 Family history of malignant neoplasm of digestive organs: Secondary | ICD-10-CM | POA: Insufficient documentation

## 2022-10-18 DIAGNOSIS — Z8349 Family history of other endocrine, nutritional and metabolic diseases: Secondary | ICD-10-CM | POA: Insufficient documentation

## 2022-10-18 DIAGNOSIS — Z818 Family history of other mental and behavioral disorders: Secondary | ICD-10-CM | POA: Insufficient documentation

## 2022-10-18 DIAGNOSIS — Z825 Family history of asthma and other chronic lower respiratory diseases: Secondary | ICD-10-CM | POA: Insufficient documentation

## 2022-10-18 DIAGNOSIS — R6883 Chills (without fever): Secondary | ICD-10-CM | POA: Diagnosis not present

## 2022-10-18 DIAGNOSIS — Z809 Family history of malignant neoplasm, unspecified: Secondary | ICD-10-CM | POA: Insufficient documentation

## 2022-10-18 DIAGNOSIS — Z8261 Family history of arthritis: Secondary | ICD-10-CM | POA: Diagnosis not present

## 2022-10-18 LAB — COMPREHENSIVE METABOLIC PANEL
ALT: 18 U/L (ref 0–44)
AST: 20 U/L (ref 15–41)
Albumin: 4.6 g/dL (ref 3.5–5.0)
Alkaline Phosphatase: 67 U/L (ref 38–126)
Anion gap: 11 (ref 5–15)
BUN: 23 mg/dL — ABNORMAL HIGH (ref 6–20)
CO2: 25 mmol/L (ref 22–32)
Calcium: 10.5 mg/dL — ABNORMAL HIGH (ref 8.9–10.3)
Chloride: 104 mmol/L (ref 98–111)
Creatinine, Ser: 0.89 mg/dL (ref 0.44–1.00)
GFR, Estimated: >60 mL/min (ref >60)
Glucose, Bld: 87 mg/dL (ref 70–99)
Potassium: 3.9 mmol/L (ref 3.5–5.1)
Sodium: 140 mmol/L (ref 135–145)
Total Bilirubin: 0.6 mg/dL (ref 0.3–1.2)
Total Protein: 7.6 g/dL (ref 6.5–8.1)

## 2022-10-18 LAB — CBC WITH DIFFERENTIAL/PLATELET
Abs Immature Granulocytes: 0.05 10*3/uL (ref 0.00–0.07)
Basophils Absolute: 0.1 10*3/uL (ref 0.0–0.1)
Basophils Relative: 1 %
Eosinophils Absolute: 0.3 10*3/uL (ref 0.0–0.5)
Eosinophils Relative: 3 %
HCT: 37.4 % (ref 36.0–46.0)
Hemoglobin: 12.1 g/dL (ref 12.0–15.0)
Immature Granulocytes: 1 %
Lymphocytes Relative: 27 %
Lymphs Abs: 2.7 10*3/uL (ref 0.7–4.0)
MCH: 29.1 pg (ref 26.0–34.0)
MCHC: 32.4 g/dL (ref 30.0–36.0)
MCV: 89.9 fL (ref 80.0–100.0)
Monocytes Absolute: 0.8 10*3/uL (ref 0.1–1.0)
Monocytes Relative: 8 %
Neutro Abs: 6 10*3/uL (ref 1.7–7.7)
Neutrophils Relative %: 60 %
Platelets: 487 10*3/uL — ABNORMAL HIGH (ref 150–400)
RBC: 4.16 MIL/uL (ref 3.87–5.11)
RDW: 14.2 % (ref 11.5–15.5)
WBC: 9.9 10*3/uL (ref 4.0–10.5)
nRBC: 0 % (ref 0.0–0.2)

## 2022-10-18 LAB — LACTATE DEHYDROGENASE: LDH: 141 U/L (ref 98–192)

## 2022-10-18 LAB — IRON AND TIBC
Iron: 104 ug/dL (ref 28–170)
Saturation Ratios: 21 % (ref 10.4–31.8)
TIBC: 489 ug/dL — ABNORMAL HIGH (ref 250–450)
UIBC: 385 ug/dL

## 2022-10-18 LAB — VITAMIN B12: Vitamin B-12: 1106 pg/mL — ABNORMAL HIGH (ref 180–914)

## 2022-10-18 LAB — FOLATE: Folate: >40 ng/mL (ref >5.9)

## 2022-10-18 LAB — TSH: TSH: 3.542 u[IU]/mL (ref 0.350–4.500)

## 2022-10-18 LAB — VITAMIN D 25 HYDROXY (VIT D DEFICIENCY, FRACTURES): Vit D, 25-Hydroxy: 27.51 ng/mL — ABNORMAL LOW (ref 30–100)

## 2022-10-18 LAB — FERRITIN: Ferritin: 153 ng/mL (ref 11–307)

## 2022-10-18 NOTE — Progress Notes (Signed)
[facility] [address] [place], [place] [address]   CLINIC:  Medical Oncology/Hematology  PCP:  Exie Parody [address] [place] [address] [phone]   REASON FOR VISIT:  Follow-up for leukocytosis and thrombocytosis  PRIOR THERAPY: None  CURRENT THERAPY: Observation  INTERVAL HISTORY:  Kristen [name] 52 y.o. female returns for routine follow-up of leukocytosis.  She was last seen in our clinic on 05/24/2020 as she was discharged from clinic for stable labs.  Her son was recently diagnosed with JAK2 mutation and her PCP recommended she see a hematologist given her platelets are intermittently high.  She was referred to Dr. Candise Che at the [facility] in [place].  All labs returned normal.   Reports she was recently hospitalized at [facility] for diverticulitis.  Reports 3 diverticulitis flares in 2023 and 1 this year.   Reports she is having "a ton of issues".  She is tired of seeing so many doctors and wants Dr. Marice Potter opinion on all of her symptoms.  Reports no energy and appetite is 75%.  Having severe bilateral lower extremity pain from knees down (cramping) with constant pain X 1 month, nausea, dizziness, vertigo and intermittent insomnia.  She takes meclizine and Zofran daily.  Wakes up every morning at 430 am due to aches/pain. Has tried elevating legs and several herbal medications to help with sleep which have been unsuccessful.  Her PCP has referred her to vein and vascular for her leg discomfort.  Reports she does not have normal bowel movements.  Has constant constipation and requires an antidiarrheal and stool softener every day.  Has been referred to a pain specialist due to back and neck pain.  She is currently on Celebrex and gabapentin which she does not feel is helping.  She was placed on high folic acid and B12 supplement by her psychiatrist but she is not sure if she should continue to take.  Reports her B12  levels were very high last month.  Reports she has pain with sex and trouble urinating.  States she feels like her urethra is enlarged and she never fully empties her bladder.  Has occasional incontinence.  REVIEW OF SYSTEMS:  Review of Systems  Constitutional:  Positive for appetite change, chills and fatigue.  Respiratory:  Positive for cough.   Gastrointestinal:  Positive for constipation.  Endocrine: Positive for hot flashes.  Genitourinary:  Positive for bladder incontinence and pelvic pain. Negative for dysuria, frequency and hematuria.   Musculoskeletal:  Positive for arthralgias, back pain, myalgias and neck pain.  Neurological:  Positive for dizziness, headaches and light-headedness.  Psychiatric/Behavioral:  Positive for sleep disturbance. The patient is nervous/anxious.       PAST MEDICAL/SURGICAL HISTORY:  Past Medical History:  Diagnosis Date   Allergies    Anemia    Anxiety    Arthritis    Atypical mole 11/03/2006   mid upper back (slight to moderate)   Atypical mole 11/03/2006   lower right back (slight to moderate)   Back pain    Bipolar 1 disorder (HCC)    Borderline diabetic    Chronic constipation    Depression    Diabetes (HCC)    Diverticulitis    Family history of adverse reaction to anesthesia    mother-- ponv   Fatty liver    GAD (generalized anxiety disorder)    GERD (gastroesophageal reflux disease)    Hiatal hernia    High triglycerides  History of kidney stones    History of recurrent UTIs    History of sepsis 06/2018   History of stomach ulcers    History of suicidal ideation    Hyperlipidemia    Hypothyroidism    IDA (iron deficiency anemia)    Joint pain    Melanoma (HCC) 11/03/2006   left chest in situ (excision)   Orthostasis    Palpitations    PONV (postoperative nausea and vomiting)    Rapid heart rate    Renal calculus, left    Seasonal allergies    Shortness of breath    Sleep disorder, unspecified    per excessive  sleeping during the day   Snoring    Suicide ideation    Swallowing difficulty    Tachycardia    Vertigo    Vitamin D deficiency    Weakness    Wears contact lenses    Past Surgical History:  Procedure Laterality Date   ANTERIOR CERVICAL DECOMP/DISCECTOMY FUSION  02/17/2012   Procedure: ANTERIOR CERVICAL DECOMPRESSION/DISCECTOMY FUSION 2 LEVELS;  Surgeon: Hewitt Shorts, MD;  Location: [facility];  Service: Neurosurgery;  Laterality: N/A;  Cervical four-five and Cervical six-seven anterior cervial decompression with fusion plating and bonegraft   ANTERIOR CERVICAL DECOMP/DISCECTOMY FUSION  04-01-2001   @[facility]    C5---6   BIOPSY  07/20/2018   Procedure: BIOPSY;  Surgeon: Corbin Ade, MD;  Location: [facility];  Service: Endoscopy;;  gastric    BREAST REDUCTION SURGERY Bilateral 1995   CESAREAN SECTION  x2   last one 1998   with BILATERAL TUBAL LIGATION   CESAREAN SECTION WITH BILATERAL TUBAL LIGATION     COLONOSCOPY WITH PROPOFOL N/A 07/20/2018   Dr. Jena Gauss: Diverticulosis, internal hemorrhoids, 5 mm splenic flexure tubular adenoma removed. Repeat in 5 years due to family history.   COLONOSCOPY WITH PROPOFOL N/A 12/07/2020   Procedure: COLONOSCOPY WITH PROPOFOL;  Surgeon: Corbin Ade, MD;  Location: [facility];  Service: Endoscopy;  Laterality: N/A;  2:30pm   CYSTOSCOPY/URETEROSCOPY/HOLMIUM LASER/STENT PLACEMENT Left 08/21/2018   Procedure: CYSTOSCOPY LEFT RETROGRADE PYELOGRAM /URETEROSCOPY/HOLMIUM LASER/STENT PLACEMENT;  Surgeon: Rene Paci, MD;  Location[facility];  Service: Urology;  Laterality: Left;   DILATION AND CURETTAGE OF UTERUS  1992   for miscarriage   ENDOMETRIAL ABLATION  2014   ESOPHAGOGASTRODUODENOSCOPY     20 years ago.    ESOPHAGOGASTRODUODENOSCOPY (EGD) WITH PROPOFOL N/A 07/20/2018   Dr. Jena Gauss: Small hiatal hernia, erythematous mucosa in the stomach with a healing gastric ulcer measuring 1 cm, biopsies negative.    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   POLYPECTOMY  07/20/2018   Procedure: POLYPECTOMY;  Surgeon: Corbin Ade, MD;  [facility];  Service: Endoscopy;;   POSTERIOR CERVICAL FUSION/FORAMINOTOMY N/A 08/18/2013   Procedure: CERVICAL FOUR TO CERVICAL SEVEN POSTERIOR CERVICAL FUSION/FORAMINOTOMY LEVEL 3;  Surgeon: Hewitt Shorts, MD;  Location: [facility];  Service: Neurosurgery;  Laterality: N/A;  C4-7 posterior cervical fusion with lateral mass fixation   RIGHT/LEFT HEART CATH AND CORONARY ANGIOGRAPHY  N/A 12/23/2019   Procedure: RIGHT/LEFT HEART CATH AND CORONARY ANGIOGRAPHY;  Surgeon: Kathleene Hazel, MD;  Location: [facility];  Service: Cardiovascular;  Laterality: N/A;     SOCIAL HISTORY:  Social History   Socioeconomic History   Marital status: Married    Spouse name: Not on file   Number of children: 2   Years of education: Not on file   Highest education level: Not on file  Occupational History   Occupation: disability  Tobacco Use   Smoking status: Former    Current packs/day: 0.00    Average packs/day: 1 pack/day for 5.0 years (5.0 ttl pk-yrs)    Types: Cigarettes    Start date: 02/09/1997    Quit date: 02/09/2002    Years since quitting: 20.7    Passive exposure: Past   Smokeless tobacco: Never  Vaping Use   Vaping status: Never Used  Substance and Sexual Activity   Alcohol use: No   Drug use: No   Sexual activity: Yes    Partners: Male    Birth control/protection: Surgical    Comment: spouse  Other Topics  Concern   Not on file  Social History Narrative   Step brother-2   Step sister-2   Half sister -1 healthy   Social Determinants of Health   Financial Resource Strain: Low Risk  (09/21/2022)   Received [facility]   Overall Financial Resource Strain (CARDIA)    Difficulty of Paying Living Expenses: Not hard at all  Food Insecurity: No Food Insecurity (09/21/2022)   Received from [facility]   Hunger Vital Sign    Worried About Running Out of Food in the Last Year: Never true    Ran Out of Food in the Last Year: Never true  Transportation Needs: No Transportation Needs (09/21/2022)   Received from [facility]   PRAPARE - Transportation    Lack of Transportation (Medical): No    Lack of Transportation (Non-Medical): No  Physical Activity: Sufficiently Active (09/21/2022)   [facility]   Exercise Vital Sign    Days of Exercise per Week: 5 days    Minutes of Exercise per Session: 30 min  Stress: No Stress Concern Present (09/21/2022)   Received from [facility] - Occupational Stress Questionnaire    Feeling of Stress : Not at all  Social Connections: Socially Integrated (09/21/2022)   Received from [facility]   Social Connection and Isolation Panel [NHANES]    Frequency of Communication with Friends and Family: More than three times a week    Frequency of Social Gatherings with Friends and Family: Three times a week    Attends Religious Services: More than 4 times per year    Active Member of Clubs or Organizations: Yes    Attends Banker Meetings: 1 to 4 times per year    Marital Status: Married  Catering manager Violence: Not At Risk (09/21/2022)   Received from [facility]   Humiliation, Afraid, Rape, and Kick questionnaire    Fear of Current or Ex-Partner: No    Emotionally Abused: No    Physically Abused: No    Sexually Abused: No    FAMILY HISTORY:  Family History   Problem Relation Age of Onset   Asthma Mother    Rheum arthritis Mother    Non-Hodgkin's lymphoma Mother    Congestive Heart Failure Mother  Atrial fibrillation Mother    Diabetes Mother    Hyperlipidemia Mother    Cancer Mother    Colon cancer Father    Multiple myeloma Father    Hypertension Father    Cancer Father    Depression Father    Anxiety disorder Father    Alcoholism Father    Obesity Father    Diabetes Maternal Grandmother    Congestive Heart Failure Maternal Grandmother    Mental illness Paternal Grandmother    Colon cancer Paternal Grandfather    Bone cancer Paternal Grandfather    Allergies Other        entire family(mom and dad)   Liver disease Neg Hx     CURRENT MEDICATIONS:  Outpatient Encounter Medications as of 10/18/2022  Medication Sig   acetaminophen (TYLENOL) 500 MG tablet Take 1,000 mg by mouth every 6 (six) hours as needed for moderate pain.   albuterol (PROVENTIL) (2.5 MG/3ML) 0.083% nebulizer solution    albuterol (VENTOLIN HFA) 108 (90 Base) MCG/ACT inhaler Inhale 2 puffs into the lungs every 6 (six) hours as needed for wheezing or shortness of breath.   ALPRAZolam (XANAX) 1 MG tablet TAKE 1 TABLET BY MOUTH 4 TIMES DAILY AS NEEDED FOR ANXIETY   aspirin EC 81 MG tablet Take 1 tablet (81 mg total) by mouth daily. Swallow whole.   buPROPion (WELLBUTRIN XL) 150 MG 24 hr tablet Take 3 tablets (450 mg total) by mouth daily.   busPIRone (BUSPAR) 15 MG tablet Take 2 tablets by mouth twice daily   cetirizine (ZYRTEC) 10 MG tablet Take 10 mg by mouth daily.   clonazePAM (KLONOPIN) 1 MG tablet 1 p.o. 3 times daily as needed anxiety and may repeat 1/2 pill during the middle of the day as needed anxiety.  No more than 3.5 mg/day.   fenofibrate (TRICOR) 145 MG tablet Take 1 tablet (145 mg total) by mouth daily.   gabapentin (NEURONTIN) 300 MG capsule Take 1 capsule (300 mg total) by mouth 3 (three) times daily.   hydrocortisone (ANUSOL-HC) 2.5 % rectal cream  Place 1 application rectally 2-4 times daily for rectal bleeding/burning related to hemorrhoids.   hydrOXYzine (ATARAX) 50 MG tablet TAKE 1 TABLET BY MOUTH EVERY 6 HOURS AS NEEDED   l-methylfolate-B6-B12 (METANX) 3-35-2 MG TABS tablet Take 1 tablet by mouth daily.   lamoTRIgine (LAMICTAL) 200 MG tablet TAKE 2 & 1/2 (TWO & ONE-HALF) TABLETS BY MOUTH ONCE DAILY   levocetirizine (XYZAL) 5 MG tablet Take 5 mg by mouth every evening.   loperamide (IMODIUM A-D) 2 MG tablet Take 1 tablet (2 mg total) by mouth 4 (four) times daily as needed for diarrhea or loose stools.   meclizine (ANTIVERT) 25 MG tablet Take 25 mg by mouth 3 (three) times daily as needed for dizziness.   metFORMIN (GLUCOPHAGE-XR) 750 MG 24 hr tablet Take 750 mg by mouth daily.   ondansetron (ZOFRAN-ODT) 8 MG disintegrating tablet Take 1 tablet (8 mg total) by mouth 2 (two) times daily as needed for nausea or vomiting.   Oxcarbazepine (TRILEPTAL) 300 MG tablet Take 3 tablets (900 mg total) by mouth at bedtime.   pantoprazole (PROTONIX) 40 MG tablet TAKE 1 TABLET BY MOUTH TWICE DAILY WITH A MEAL (Patient taking differently: Take 40 mg by mouth daily.)   Peppermint Oil (IBGARD) 90 MG CPCR Take as directed   rosuvastatin (CRESTOR) 5 MG tablet Take 1 tablet (5 mg total) by mouth daily.   Semaglutide, 2 MG/DOSE, (OZEMPIC, 2 MG/DOSE,) 8 MG/3ML  SOPN Inject 2 mg into the skin once a week.   sertraline (ZOLOFT) 100 MG tablet TAKE 3 TABLETS BY MOUTH AT BEDTIME   Tenapanor HCl (IBSRELA) 50 MG TABS Take 1 tablet by mouth 2 (two) times daily.   No facility-administered encounter medications on file as of 10/18/2022.    ALLERGIES:  Allergies  Allergen Reactions   Iohexol Hives     Desc: IVP DYE Also developed hives when drinking this contrast responded to benadryl.  Desc: IVP DYE  Desc: IVP DYE Also developed hives when drinking this contrast responded to benadryl.   Rosuvastatin Other (See Comments)    Muscle aches with 10mg  and 20mg ,  tolerates 5mg  daily without issues Muscle aches with 20mg   Muscle aches with 10mg  and 20mg , tolerates 5mg  daily without issues    Diatrizoate Other (See Comments)     PHYSICAL EXAM:  ECOG PERFORMANCE STATUS: 1 - Symptomatic but completely ambulatory  There were no vitals filed for this visit. There were no vitals filed for this visit. Physical Exam Constitutional:      Appearance: Normal appearance.  Cardiovascular:     Rate and Rhythm: Normal rate and regular rhythm.  Pulmonary:     Effort: Pulmonary effort is normal.     Breath sounds: Normal breath sounds.  Abdominal:     General: Bowel sounds are normal.     Palpations: Abdomen is soft.  Musculoskeletal:        General: No swelling. Normal range of motion.  Neurological:     Mental Status: She is alert and oriented to person, place, and time. Mental status is at baseline.      LABORATORY DATA:  I have reviewed the labs as listed.  CBC    Component Value Date/Time   WBC 7.8 06/06/2022 1029   WBC 12.0 (H) 12/25/2021 0921   RBC 4.18 06/06/2022 1029   HGB 12.0 06/06/2022 1029   HGB 12.9 12/17/2019 1052   HCT 36.2 06/06/2022 1029   HCT 39.0 12/17/2019 1052   PLT 445 (H) 06/06/2022 1029   PLT 425 12/17/2019 1052   MCV 86.6 06/06/2022 1029   MCV 86 12/17/2019 1052   MCH 28.7 06/06/2022 1029   MCHC 33.1 06/06/2022 1029   RDW 14.1 06/06/2022 1029   RDW 13.5 12/17/2019 1052   LYMPHSABS 2.2 06/06/2022 1029   LYMPHSABS 3.0 12/17/2019 1052   MONOABS 0.6 06/06/2022 1029   EOSABS 0.2 06/06/2022 1029   EOSABS 0.8 (H) 12/17/2019 1052   BASOSABS 0.1 06/06/2022 1029   BASOSABS 0.1 12/17/2019 1052      Latest Ref Rng & Units 06/06/2022   10:29 AM 12/25/2021    9:21 AM 12/17/2021   11:28 PM  CMP  Glucose 70 - 99 mg/dL 77  409  83   BUN 6 - 20 mg/dL 20  18  21    Creatinine 0.44 - 1.00 mg/dL 8.11  9.14  7.82   Sodium 135 - 145 mmol/L 143  142  139   Potassium 3.5 - 5.1 mmol/L 3.9  3.6  3.4   Chloride 98 - 111 mmol/L  108  109  104   CO2 22 - 32 mmol/L 29  25  26    Calcium 8.9 - 10.3 mg/dL 95.6  9.0  9.2   Total Protein 6.5 - 8.1 g/dL 7.2  6.9  7.6   Total Bilirubin 0.3 - 1.2 mg/dL 0.4  0.2  0.6   Alkaline Phos 38 - 126 U/L 63  107  111   AST 15 - 41 U/L 24  21  20    ALT 0 - 44 U/L 17  23  25      DIAGNOSTIC IMAGING:  I have independently reviewed the relevant imaging and discussed with the patient.  ASSESSMENT & PLAN: 1.  Neutrophilic leukocytosis and thrombocytosis - JAK2 CALR, and MPL testing was negative; negative BCR/ABL FISH testing; normal LDH, negative SPEP testing - History of intermittent neutrophilic leukocytosis since 2009, with WBC up to to 20.5 -Repeat JAK2 CALR and MPL testing was negative on 06/06/2022. -Most recent lab work from 3 weeks ago while hospitalized at [facility] showed a white count of 13.0, hemoglobin 10.6 and platelet count of 528.  Absolute neutrophil count 8.2. - Ultrasound of the abdomen on 05/27/2019 showed probable fatty infiltration of the liver with minimal splenic enlargement measuring 12.1 cm in length with volume calculated to 502 mL.  - She has had 3 diverticulitis flares in 2023 and 1 in 2024 treated most recently at [facility]. -Would like to recheck labs today.   -Repeat labs from 10/18/2022 show white count of 9.9 with a hemoglobin of 12.1.  Platelet count 487.  Calcium 10.5.  LFTs normal.  Iron saturation 21%, TIBC elevated at 489.  Folate greater than 40.  Ferritin 153.  Vitamin B12 1106.  -Based on labs from today, recommend 1g of IV iron and rechecking labs in 6 week to see if platelets improved as well as hemoglobin.  -Recommend follow-up in 6 weeks and see provider a few days later.  2.  Family history: -Father had myeloma and paternal grandfather also had myeloma.  Mother had non-Hodgkin's lymphoma.  Maternal uncle had non-Hodgkin's lymphoma.  Paternal aunt had colon cancer.  3.  Elevated B12 and folate levels: -Discussed she may stop supplements  psychiatry placed her on given levels are high/adequate.  4.  Bilateral lower extremity pain/cramping: -Discussed ruling out nutritional deficiencies today. -Would not cancel her appointment with vein and vascular until after she sees her lab work and or receives her iron infusions.  5.  Neck and back pain: -Would like to stop taking gabapentin and Celebrex. -Would follow-up with pain clinic as needed.  6.  Dizziness: -Continue meclizine and Zofran for nausea.    PLAN SUMMARY & DISPOSITION: PLAN SUMMARY: >> Recheck labs today. >> Discussed going ahead and scheduling her 1 g IV iron based on [facility] labs. >> Follow-up in 6 weeks with labs a few days before (CBC with differential, CMP, ferritin, iron panel, B12).      All questions were answered. The patient knows to call the clinic with any problems, questions or concerns.  Medical decision making: High   Time spent on visit: I spent 40 minutes counseling the patient face to face. The total time spent in the appointment was 20 minutes and more than 50% was on counseling.   Mauro Kaufmann, NP  01/23/2021 10:37 AM

## 2022-10-19 ENCOUNTER — Encounter: Payer: Self-pay | Admitting: Oncology

## 2022-10-21 NOTE — Telephone Encounter (Signed)
Please advise.  I know the answer, but wanted you to be in the loop and make sure we are on the same page.

## 2022-10-22 LAB — COPPER, SERUM: Copper: 96 ug/dL (ref 80–158)

## 2022-10-26 ENCOUNTER — Other Ambulatory Visit: Payer: Self-pay | Admitting: Physician Assistant

## 2022-10-27 NOTE — Telephone Encounter (Signed)
Has appt. tomorrow

## 2022-10-28 ENCOUNTER — Encounter: Payer: Self-pay | Admitting: Physician Assistant

## 2022-10-28 ENCOUNTER — Telehealth (INDEPENDENT_AMBULATORY_CARE_PROVIDER_SITE_OTHER): Payer: 59 | Admitting: Physician Assistant

## 2022-10-28 DIAGNOSIS — F5105 Insomnia due to other mental disorder: Secondary | ICD-10-CM

## 2022-10-28 DIAGNOSIS — R5383 Other fatigue: Secondary | ICD-10-CM

## 2022-10-28 DIAGNOSIS — F319 Bipolar disorder, unspecified: Secondary | ICD-10-CM

## 2022-10-28 DIAGNOSIS — Z79899 Other long term (current) drug therapy: Secondary | ICD-10-CM

## 2022-10-28 DIAGNOSIS — G2581 Restless legs syndrome: Secondary | ICD-10-CM

## 2022-10-28 DIAGNOSIS — F411 Generalized anxiety disorder: Secondary | ICD-10-CM

## 2022-10-28 DIAGNOSIS — Z1589 Genetic susceptibility to other disease: Secondary | ICD-10-CM

## 2022-10-28 DIAGNOSIS — R5381 Other malaise: Secondary | ICD-10-CM

## 2022-10-28 DIAGNOSIS — R748 Abnormal levels of other serum enzymes: Secondary | ICD-10-CM

## 2022-10-28 DIAGNOSIS — E559 Vitamin D deficiency, unspecified: Secondary | ICD-10-CM | POA: Diagnosis not present

## 2022-10-28 DIAGNOSIS — F99 Mental disorder, not otherwise specified: Secondary | ICD-10-CM

## 2022-10-28 DIAGNOSIS — G4733 Obstructive sleep apnea (adult) (pediatric): Secondary | ICD-10-CM | POA: Diagnosis not present

## 2022-10-28 MED ORDER — ALPRAZOLAM 1 MG PO TABS: 1.0000 mg | ORAL_TABLET | Freq: Four times a day (QID) | ORAL | 5 refills | Status: DC | PRN

## 2022-10-28 NOTE — Progress Notes (Signed)
[facility]  Patient ID: Kristen [name],  MRN: [id number]  PCP: Exie Parody  Date of Evaluation: 10/28/2022  Time spent: 32 minutes  Chief Complaint:  Chief Complaint   Anxiety; Depression; Follow-up    Virtual Visit via Telehealth  I connected with patient by a video enabled telemedicine application with their informed consent, and verified patient privacy and that I am speaking with the correct person using two identifiers.  I am private, in my office and the patient is at home.  I discussed the limitations, risks, security and privacy concerns of performing an evaluation and management service by video and the availability of in person appointments. I also discussed with the patient that there may be a patient responsible charge related to this service. The patient expressed understanding and agreed to proceed.   I discussed the assessment and treatment plan with the patient. The patient was provided an opportunity to ask questions and all were answered. The patient agreed with the plan and demonstrated an understanding of the instructions.   The patient was advised to call back or seek an in-person evaluation if the symptoms worsen or if the condition fails to improve as anticipated.  I provided 32 minutes of non-face-to-face time during this encounter.  HISTORY/CURRENT STATUS: HPI For routine med check.  Was in hospital a few months ago for diverticulitis. Better now.  She was found to have elevated B12 levels.  Hematology stopped the methyl folate to see if that would help.  States she is having iron infusion tomorrow because her level was low.  She stays tired a lot.  She sleeps well most of the time, usually getting 7 to 8 hours but sometimes she will sleep 10 hours.  She feels like it is due to her iron level.  Hopefully she will feel better once she has the infusion.  Still has good days and bad days.  She does not cry easily.  Her husband is still  sick and out of work so that stressful.  "Some things I just cannot control and have had to let it go."  Therapy is very helpful, giving her coping skills to deal with all the stress.  Still stressed with her son who is in the [employer], see previous notes.  She does not enjoy much of anything but feels like that is partly due to the fatigue.  Energy and motivation are low.  She does what she has to do around the house.  She is still working part-time at [employer], behind Scientist, product/process development.  Just a few days a week.  Personal hygiene is normal.  Anxiety is well controlled.  She does not need that Xanax 4 times a day every day but when she does take it it is effective.  Not having panic attacks but just gets so overwhelmed with everything going on.  Denies suicidal or homicidal thoughts.  Patient denies increased energy with decreased need for sleep, increased talkativeness, racing thoughts, impulsivity or risky behaviors, increased spending, increased libido, grandiosity, increased irritability or anger, paranoia, or hallucinations.  Review of Systems  Constitutional:  Positive for malaise/fatigue.  HENT: Negative.    Eyes: Negative.   Respiratory: Negative.    Cardiovascular: Negative.   Gastrointestinal: Negative.   Genitourinary: Negative.   Musculoskeletal: Negative.   Skin: Negative.   Neurological: Negative.   Endo/Heme/Allergies: Negative.   Psychiatric/Behavioral:         See HPI   Individual Medical History/ Review of Systems:  Changes? :Yes see HPI     Past medications for mental health diagnoses include: Prozac, Paxil, Lexapro, Celexa, Zoloft, Viibryd, Effexor, Cymbalta, Risperdal, Latuda, Lamictal, VPA, Lithium-became toxic, Xanax, Wellbutrin, Abilify, Thea Silversmith, Strattera, Kapvay, Saphris, Adderall, Vyvanse, Lunesta, Sonata,  Vraylar, Seroquel, Geodon, Tegretal  Allergies: Iohexol, Rosuvastatin, and Diatrizoate  Current Medications:  Current Outpatient Medications:    acetaminophen (TYLENOL)  500 MG tablet, Take 1,000 mg by mouth every 6 (six) hours as needed for moderate pain., Disp: , Rfl:    albuterol (PROVENTIL) (2.5 MG/3ML) 0.083% nebulizer solution, , Disp: , Rfl:    albuterol (VENTOLIN HFA) 108 (90 Base) MCG/ACT inhaler, Inhale 2 puffs into the lungs every 6 (six) hours as needed for wheezing or shortness of breath., Disp: 18 g, Rfl: 1   ALPRAZolam (XANAX) 1 MG tablet, TAKE 1 TABLET BY MOUTH 4 TIMES DAILY AS NEEDED FOR ANXIETY, Disp: 120 tablet, Rfl: 0   aspirin EC 81 MG tablet, Take 1 tablet (81 mg total) by mouth daily. Swallow whole., Disp: 30 tablet, Rfl: 11   buPROPion (WELLBUTRIN XL) 150 MG 24 hr tablet, Take 3 tablets (450 mg total) by mouth daily., Disp: 270 tablet, Rfl: 3   busPIRone (BUSPAR) 15 MG tablet, Take 2 tablets by mouth twice daily, Disp: 120 tablet, Rfl: 0   cholecalciferol (VITAMIN D3) 25 MCG (1000 UNIT) tablet, Take 2,000 Units by mouth daily., Disp: , Rfl:    fenofibrate (TRICOR) 145 MG tablet, Take 1 tablet (145 mg total) by mouth daily., Disp: 90 tablet, Rfl: 3   fluticasone (FLONASE) 50 MCG/ACT nasal spray, Place 1 spray into both nostrils daily., Disp: , Rfl:    hydrocortisone (ANUSOL-HC) 2.5 % rectal cream, Place 1 application rectally 2-4 times daily for rectal bleeding/burning related to hemorrhoids., Disp: 30 g, Rfl: 1   hydrOXYzine (ATARAX) 50 MG tablet, TAKE 1 TABLET BY MOUTH EVERY 6 HOURS AS NEEDED, Disp: 120 tablet, Rfl: 0   lamoTRIgine (LAMICTAL) 200 MG tablet, TAKE 2 & 1/2 (TWO & ONE-HALF) TABLETS BY MOUTH ONCE DAILY, Disp: 225 tablet, Rfl: 0   levocetirizine (XYZAL) 5 MG tablet, Take 5 mg by mouth every evening., Disp: , Rfl:    levothyroxine (SYNTHROID) 75 MCG tablet, Take 75 mcg by mouth daily., Disp: , Rfl:    loperamide (IMODIUM A-D) 2 MG tablet, Take 1 tablet (2 mg total) by mouth 4 (four) times daily as needed for diarrhea or loose stools., Disp: 40 tablet, Rfl: 0   meclizine (ANTIVERT) 25 MG tablet, Take 25 mg by mouth 3 (three) times  daily as needed for dizziness., Disp: , Rfl:    metFORMIN (GLUCOPHAGE-XR) 750 MG 24 hr tablet, Take 750 mg by mouth daily., Disp: , Rfl:    ondansetron (ZOFRAN-ODT) 8 MG disintegrating tablet, Take 1 tablet (8 mg total) by mouth 2 (two) times daily as needed for nausea or vomiting., Disp: 60 tablet, Rfl: 5   Oxcarbazepine (TRILEPTAL) 300 MG tablet, Take 3 tablets (900 mg total) by mouth at bedtime., Disp: 270 tablet, Rfl: 3   pantoprazole (PROTONIX) 40 MG tablet, TAKE 1 TABLET BY MOUTH TWICE DAILY WITH A MEAL (Patient taking differently: Take 40 mg by mouth daily.), Disp: 180 tablet, Rfl: 2   Semaglutide, 2 MG/DOSE, (OZEMPIC, 2 MG/DOSE,) 8 MG/3ML SOPN, Inject 2 mg into the skin once a week., Disp: 3 mL, Rfl: 11   sertraline (ZOLOFT) 100 MG tablet, TAKE 3 TABLETS BY MOUTH AT BEDTIME, Disp: 90 tablet, Rfl: 0   Tenapanor HCl (IBSRELA) 50 MG  TABS, Take 1 tablet by mouth 2 (two) times daily., Disp: 60 tablet, Rfl: 5   celecoxib (CELEBREX) 100 MG capsule, Take 100 mg by mouth daily. (Patient not taking: Reported on 10/28/2022), Disp: , Rfl:    cetirizine (ZYRTEC) 10 MG tablet, Take 10 mg by mouth daily. (Patient not taking: Reported on 10/28/2022), Disp: , Rfl:    gabapentin (NEURONTIN) 300 MG capsule, Take 1 capsule (300 mg total) by mouth 3 (three) times daily. (Patient not taking: Reported on 10/28/2022), Disp: , Rfl:    l-methylfolate-B6-B12 (METANX) 3-35-2 MG TABS tablet, Take 1 tablet by mouth daily. (Patient not taking: Reported on 10/28/2022), Disp: 30 tablet, Rfl: 1   Peppermint Oil (IBGARD) 90 MG CPCR, Take as directed (Patient not taking: Reported on 10/28/2022), Disp: 12 capsule, Rfl: 0 Medication Side Effects:  Sweating  Family Medical/ Social History: Changes? None  MENTAL HEALTH EXAM:  There were no vitals taken for this visit.There is no height or weight on file to calculate BMI.  General Appearance: Casual and Well Groomed  Eye Contact:  Good  Speech:  Clear and Coherent and Normal Rate   Volume:  Normal  Mood:  Euthymic  Affect:  Congruent  Thought Process:  Goal Directed and Descriptions of Associations: Circumstantial  Orientation:  Full (Time, Place, and Person)  Thought Content: Logical   Suicidal Thoughts:  No  Homicidal Thoughts:  No  Memory:  WNL  Judgement:  Good  Insight:  Good  Psychomotor Activity:  Normal  Concentration:  Concentration: Good and Attention Span: Good  Recall:  Good  Fund of Knowledge: Good  Language: Good  Assets:  Desire for Improvement  ADL's:  Intact  Cognition: WNL  Prognosis:  Good   Gene site test results are under labs dated 04/03/2021.  Hospital discharge summary from 09/22/2022 were reviewed. Labs from 09/21/2022 to present were reviewed.  DIAGNOSES:    ICD-10-CM   1. Bipolar I disorder (HCC)  F31.9     2. Generalized anxiety disorder  F41.1     3. Vitamin D deficiency  E55.9     4. Obstructive sleep apnea  G47.33     5. Insomnia due to other mental disorder  F51.05    F99     6. Malaise and fatigue  R53.81    R53.83     7. MTHFR gene mutation  Z15.89     8. Restless legs  G25.81     9. Polypharmacy  Z79.899     10. Elevated vitamin B12 level  R74.8       Receiving Psychotherapy: Yes    Vick Frees every 2 weeks  RECOMMENDATIONS:  PDMP was reviewed.  Last Xanax filled 10/15/2022.  Gabapentin filled 10/11/2022. I provided 32 minutes of non-face-to-face time during this encounter, including time spent before and after the visit in records review, medical decision making, counseling pertinent to today's visit, and charting.   She is doing well as far as her mental health medications go so no changes will be made.  Continue Xanax 1 mg, 1 po qid prn.  Continue Wellbutrin XL 150 mg, 3 p.o. every morning. Continue Buspar 15 mg, to 2 po twice daily. Continue hydroxyzine to 50 mg, 1 p.o. every 6 hours as needed anxiety.   Continue Lamictal 200 mg, 2.5 pills daily. Continue Trileptal 300 mg, 3 nightly. Continue  Zoloft 100 mg, 3 p.o. daily. Continue counseling with Vick Frees.  Return in 2 months.  Melony Overly, PA-C

## 2022-10-29 ENCOUNTER — Inpatient Hospital Stay: Payer: 59

## 2022-10-29 VITALS — BP 102/66 | HR 84 | Temp 99.2°F | Resp 18

## 2022-10-29 DIAGNOSIS — D509 Iron deficiency anemia, unspecified: Secondary | ICD-10-CM

## 2022-10-29 DIAGNOSIS — D72829 Elevated white blood cell count, unspecified: Secondary | ICD-10-CM | POA: Diagnosis not present

## 2022-10-29 MED ORDER — SODIUM CHLORIDE 0.9 % IV SOLN: Freq: Once | INTRAVENOUS | Status: AC

## 2022-10-29 MED ORDER — ACETAMINOPHEN 325 MG PO TABS
650.0000 mg | ORAL_TABLET | Freq: Once | ORAL | Status: AC
  Administered 2022-10-29: 650 mg via ORAL
  Filled 2022-10-29: qty 2

## 2022-10-29 MED ORDER — SODIUM CHLORIDE 0.9 % IV SOLN
950.0000 mg | Freq: Once | INTRAVENOUS | Status: AC
  Administered 2022-10-29: 950 mg via INTRAVENOUS
  Filled 2022-10-29: qty 19

## 2022-10-29 MED ORDER — FAMOTIDINE IN NACL 20-0.9 MG/50ML-% IV SOLN
20.0000 mg | Freq: Once | INTRAVENOUS | Status: AC
  Administered 2022-10-29: 20 mg via INTRAVENOUS
  Filled 2022-10-29: qty 50

## 2022-10-29 MED ORDER — METHYLPREDNISOLONE SODIUM SUCC 125 MG IJ SOLR
125.0000 mg | Freq: Once | INTRAMUSCULAR | Status: AC
  Administered 2022-10-29: 125 mg via INTRAVENOUS
  Filled 2022-10-29: qty 2

## 2022-10-29 MED ORDER — SODIUM CHLORIDE 0.9 % IV SOLN
50.0000 mg | Freq: Once | INTRAVENOUS | Status: AC
  Administered 2022-10-29: 50 mg via INTRAVENOUS
  Filled 2022-10-29: qty 1

## 2022-10-29 MED ORDER — SODIUM CHLORIDE 0.9 % IV SOLN
25.0000 mg | Freq: Once | INTRAVENOUS | Status: DC
  Filled 2022-10-29: qty 0.5

## 2022-10-29 MED ORDER — CETIRIZINE HCL 10 MG/ML IV SOLN
10.0000 mg | Freq: Once | INTRAVENOUS | Status: AC
  Administered 2022-10-29: 10 mg via INTRAVENOUS
  Filled 2022-10-29: qty 1

## 2022-10-29 NOTE — Progress Notes (Signed)
Patient presents today for iron infusion of Venofer.  Patient is in satisfactory condition with no new complaints voiced.  Vital signs are stable.  We will proceed with infusion per provider orders.    Patient tolerated treatment well with no complaints voiced.  Patient left ambulatory in stable condition.  Vital signs stable at discharge.  Follow up as scheduled.

## 2022-10-29 NOTE — Patient Instructions (Signed)
[facility]  Discharge Instructions: Thank you for choosing [facility] to provide your oncology and hematology care.  If you have a lab appointment with the Cancer Center - please note that after April 8th, 2024, all labs will be drawn in the cancer center.  You do not have to check in or register with the main entrance as you have in the past but will complete your check-in in the cancer center.  Wear comfortable clothing and clothing appropriate for easy access to any Portacath or PICC line.   We strive to give you quality time with your provider. You may need to reschedule your appointment if you arrive late (15 or more minutes).  Arriving late affects you and other patients whose appointments are after yours.  Also, if you miss three or more appointments without notifying the office, you may be dismissed from the clinic at the provider's discretion.      For prescription refill requests, have your pharmacy contact our office and allow 72 hours for refills to be completed.    Today you received the following Infed infusion.     Iron Dextran Injection What is this medication? IRON DEXTRAN (EYE ern DEX tran) treats low levels of iron in your body. Iron is a mineral that plays an important role in making red blood cells, which carry oxygen from your lungs to the rest of your body. This medicine may be used for other purposes; ask your health care provider or pharmacist if you have questions. COMMON BRAND NAME(S): Dexferrum, INFeD What should I tell my care team before I take this medication? They need to know if you have any of these conditions: Anemia not caused by low iron levels Heart disease High levels of iron in the blood Kidney disease Liver disease An unusual or allergic reaction to iron, other medications, foods, dyes, or preservatives Pregnant or trying to get pregnant Breastfeeding How should I use this medication? This medication is for  injection into a vein or a muscle. It is given in a hospital or clinic setting. Talk to your care team about the use of this medication in children. While this medication may be prescribed for children as young as 54 months old for selected conditions, precautions do apply. Overdosage: If you think you have taken too much of this medicine contact a poison control center or emergency room at once. NOTE: This medicine is only for you. Do not share this medicine with others. What if I miss a dose? Keep appointments for follow-up doses. It is important not to miss your dose. Call your care team if you are unable to keep an appointment. What may interact with this medication? Do not take this medication with any of the following: Deferoxamine Dimercaprol Other iron products This medication may also interact with the following: Chloramphenicol Deferasirox This list may not describe all possible interactions. Give your health care provider a list of all the medicines, herbs, non-prescription drugs, or dietary supplements you use. Also tell them if you smoke, drink alcohol, or use illegal drugs. Some items may interact with your medicine. What should I watch for while using this medication? Visit your care team for regular checks on your progress. Tell your care team if your symptoms do not start to get better or if they get worse. You may need blood work while taking this medication. You may need to eat more foods that contain iron. Talk to your care team. Foods that contain  iron include whole grains/cereals, dried fruits, beans, peas, leafy green vegetables, and organ meats (liver, kidney). Long-term use of this medication may increase your risk of some cancers. Talk to your care team about your risk of cancer. What side effects may I notice from receiving this medication? Side effects that you should report to your care team as soon as possible: Allergic reactions--skin rash, itching, hives, swelling  of the face, lips, tongue, or throat Low blood pressure--dizziness, feeling faint or lightheaded, blurry vision Shortness of breath Side effects that usually do not require medical attention (report to your care team if they continue or are bothersome): Flushing Headache Joint pain Muscle pain Nausea Pain, redness, or irritation at injection site This list may not describe all possible side effects. Call your doctor for medical advice about side effects. You may report side effects to FDA at 1-800-FDA-1088. Where should I keep my medication? This medication is given in a hospital or clinic. It will not be stored at home. NOTE: This sheet is a summary. It may not cover all possible information. If you have questions about this medicine, talk to your doctor, pharmacist, or health care provider.  2024 Elsevier/Gold Standard (2021-08-08 00:00:00)    To help prevent nausea and vomiting after your treatment, we encourage you to take your nausea medication as directed.  BELOW ARE SYMPTOMS THAT SHOULD BE REPORTED IMMEDIATELY: *FEVER GREATER THAN 100.4 F (38 C) OR HIGHER *CHILLS OR SWEATING *NAUSEA AND VOMITING THAT IS NOT CONTROLLED WITH YOUR NAUSEA MEDICATION *UNUSUAL SHORTNESS OF BREATH *UNUSUAL BRUISING OR BLEEDING *URINARY PROBLEMS (pain or burning when urinating, or frequent urination) *BOWEL PROBLEMS (unusual diarrhea, constipation, pain near the anus) TENDERNESS IN MOUTH AND THROAT WITH OR WITHOUT PRESENCE OF ULCERS (sore throat, sores in mouth, or a toothache) UNUSUAL RASH, SWELLING OR PAIN  UNUSUAL VAGINAL DISCHARGE OR ITCHING   Items with * indicate a potential emergency and should be followed up as soon as possible or go to the Emergency Department if any problems should occur.  Please show the CHEMOTHERAPY ALERT CARD or IMMUNOTHERAPY ALERT CARD at check-in to the Emergency Department and triage nurse.  Should you have questions after your visit or need to cancel or reschedule  your appointment, please contact [facility] [phone]  and follow the prompts.  Office hours are 8:00 a.m. to 4:30 p.m. Monday - Friday. Please note that voicemails left after 4:00 p.m. may not be returned until the following business day.  We are closed weekends and major holidays. You have access to a nurse at all times for urgent questions. Please call the main number to the clinic [phone] and follow the prompts.  For any non-urgent questions, you may also contact your provider using MyChart. We now offer e-Visits for anyone 74 and older to request care online for non-urgent symptoms. For details visit [email or web address].   Also download the MyChart app! Go to the app store, search "MyChart", open the app, select [place], and log in with your MyChart username and password.

## 2022-11-04 ENCOUNTER — Encounter: Payer: Self-pay | Admitting: Hematology

## 2022-11-06 ENCOUNTER — Other Ambulatory Visit: Payer: Self-pay

## 2022-11-06 ENCOUNTER — Ambulatory Visit: Payer: Self-pay | Admitting: Hematology

## 2022-11-11 ENCOUNTER — Other Ambulatory Visit: Payer: Self-pay | Admitting: Physician Assistant

## 2022-11-12 ENCOUNTER — Other Ambulatory Visit: Payer: Self-pay | Admitting: Physician Assistant

## 2022-11-21 ENCOUNTER — Other Ambulatory Visit: Payer: Self-pay

## 2022-11-21 DIAGNOSIS — D72829 Elevated white blood cell count, unspecified: Secondary | ICD-10-CM

## 2022-11-21 DIAGNOSIS — E559 Vitamin D deficiency, unspecified: Secondary | ICD-10-CM

## 2022-11-21 DIAGNOSIS — D509 Iron deficiency anemia, unspecified: Secondary | ICD-10-CM

## 2022-11-21 DIAGNOSIS — D75839 Thrombocytosis, unspecified: Secondary | ICD-10-CM

## 2022-11-22 ENCOUNTER — Inpatient Hospital Stay: Payer: 59 | Attending: Oncology

## 2022-11-22 ENCOUNTER — Encounter: Payer: Self-pay | Admitting: Hematology

## 2022-11-22 DIAGNOSIS — M79605 Pain in left leg: Secondary | ICD-10-CM | POA: Insufficient documentation

## 2022-11-22 DIAGNOSIS — M542 Cervicalgia: Secondary | ICD-10-CM | POA: Insufficient documentation

## 2022-11-22 DIAGNOSIS — E559 Vitamin D deficiency, unspecified: Secondary | ICD-10-CM

## 2022-11-22 DIAGNOSIS — M79604 Pain in right leg: Secondary | ICD-10-CM | POA: Diagnosis not present

## 2022-11-22 DIAGNOSIS — Z888 Allergy status to other drugs, medicaments and biological substances status: Secondary | ICD-10-CM | POA: Diagnosis not present

## 2022-11-22 DIAGNOSIS — K5900 Constipation, unspecified: Secondary | ICD-10-CM | POA: Insufficient documentation

## 2022-11-22 DIAGNOSIS — Z807 Family history of other malignant neoplasms of lymphoid, hematopoietic and related tissues: Secondary | ICD-10-CM | POA: Insufficient documentation

## 2022-11-22 DIAGNOSIS — Z79899 Other long term (current) drug therapy: Secondary | ICD-10-CM | POA: Diagnosis not present

## 2022-11-22 DIAGNOSIS — Z83438 Family history of other disorder of lipoprotein metabolism and other lipidemia: Secondary | ICD-10-CM | POA: Insufficient documentation

## 2022-11-22 DIAGNOSIS — K5730 Diverticulosis of large intestine without perforation or abscess without bleeding: Secondary | ICD-10-CM | POA: Insufficient documentation

## 2022-11-22 DIAGNOSIS — Z8249 Family history of ischemic heart disease and other diseases of the circulatory system: Secondary | ICD-10-CM | POA: Insufficient documentation

## 2022-11-22 DIAGNOSIS — R5383 Other fatigue: Secondary | ICD-10-CM | POA: Diagnosis not present

## 2022-11-22 DIAGNOSIS — Z87891 Personal history of nicotine dependence: Secondary | ICD-10-CM | POA: Insufficient documentation

## 2022-11-22 DIAGNOSIS — Z8719 Personal history of other diseases of the digestive system: Secondary | ICD-10-CM | POA: Diagnosis not present

## 2022-11-22 DIAGNOSIS — Z79890 Hormone replacement therapy (postmenopausal): Secondary | ICD-10-CM | POA: Insufficient documentation

## 2022-11-22 DIAGNOSIS — Z800 Family history of malignant neoplasm of digestive organs: Secondary | ICD-10-CM | POA: Insufficient documentation

## 2022-11-22 DIAGNOSIS — D75839 Thrombocytosis, unspecified: Secondary | ICD-10-CM | POA: Insufficient documentation

## 2022-11-22 DIAGNOSIS — Z8261 Family history of arthritis: Secondary | ICD-10-CM | POA: Diagnosis not present

## 2022-11-22 DIAGNOSIS — R059 Cough, unspecified: Secondary | ICD-10-CM | POA: Diagnosis not present

## 2022-11-22 DIAGNOSIS — D72829 Elevated white blood cell count, unspecified: Secondary | ICD-10-CM | POA: Diagnosis present

## 2022-11-22 DIAGNOSIS — Z833 Family history of diabetes mellitus: Secondary | ICD-10-CM | POA: Diagnosis not present

## 2022-11-22 DIAGNOSIS — D509 Iron deficiency anemia, unspecified: Secondary | ICD-10-CM

## 2022-11-22 DIAGNOSIS — Z860101 Personal history of adenomatous and serrated colon polyps: Secondary | ICD-10-CM | POA: Insufficient documentation

## 2022-11-22 DIAGNOSIS — R110 Nausea: Secondary | ICD-10-CM | POA: Diagnosis not present

## 2022-11-22 DIAGNOSIS — R42 Dizziness and giddiness: Secondary | ICD-10-CM | POA: Insufficient documentation

## 2022-11-22 DIAGNOSIS — Z811 Family history of alcohol abuse and dependence: Secondary | ICD-10-CM | POA: Insufficient documentation

## 2022-11-22 DIAGNOSIS — Z808 Family history of malignant neoplasm of other organs or systems: Secondary | ICD-10-CM | POA: Insufficient documentation

## 2022-11-22 DIAGNOSIS — M2550 Pain in unspecified joint: Secondary | ICD-10-CM | POA: Insufficient documentation

## 2022-11-22 DIAGNOSIS — R519 Headache, unspecified: Secondary | ICD-10-CM | POA: Diagnosis not present

## 2022-11-22 DIAGNOSIS — Z818 Family history of other mental and behavioral disorders: Secondary | ICD-10-CM | POA: Insufficient documentation

## 2022-11-22 DIAGNOSIS — M549 Dorsalgia, unspecified: Secondary | ICD-10-CM | POA: Diagnosis not present

## 2022-11-22 DIAGNOSIS — Z825 Family history of asthma and other chronic lower respiratory diseases: Secondary | ICD-10-CM | POA: Insufficient documentation

## 2022-11-22 LAB — COMPREHENSIVE METABOLIC PANEL
ALT: 17 U/L (ref 0–44)
AST: 20 U/L (ref 15–41)
Albumin: 4.5 g/dL (ref 3.5–5.0)
Alkaline Phosphatase: 70 U/L (ref 38–126)
Anion gap: 8 (ref 5–15)
BUN: 24 mg/dL — ABNORMAL HIGH (ref 6–20)
CO2: 26 mmol/L (ref 22–32)
Calcium: 9.8 mg/dL (ref 8.9–10.3)
Chloride: 104 mmol/L (ref 98–111)
Creatinine, Ser: 1.01 mg/dL — ABNORMAL HIGH (ref 0.44–1.00)
GFR, Estimated: >60 mL/min (ref >60)
Glucose, Bld: 78 mg/dL (ref 70–99)
Potassium: 3.9 mmol/L (ref 3.5–5.1)
Sodium: 138 mmol/L (ref 135–145)
Total Bilirubin: 0.5 mg/dL (ref 0.3–1.2)
Total Protein: 7.8 g/dL (ref 6.5–8.1)

## 2022-11-22 LAB — CBC WITH DIFFERENTIAL/PLATELET
Abs Immature Granulocytes: 0.12 10*3/uL — ABNORMAL HIGH (ref 0.00–0.07)
Basophils Absolute: 0.1 10*3/uL (ref 0.0–0.1)
Basophils Relative: 1 %
Eosinophils Absolute: 0.3 10*3/uL (ref 0.0–0.5)
Eosinophils Relative: 3 %
HCT: 36 % (ref 36.0–46.0)
Hemoglobin: 11.7 g/dL — ABNORMAL LOW (ref 12.0–15.0)
Immature Granulocytes: 1 %
Lymphocytes Relative: 31 %
Lymphs Abs: 3 10*3/uL (ref 0.7–4.0)
MCH: 29.5 pg (ref 26.0–34.0)
MCHC: 32.5 g/dL (ref 30.0–36.0)
MCV: 90.7 fL (ref 80.0–100.0)
Monocytes Absolute: 0.6 10*3/uL (ref 0.1–1.0)
Monocytes Relative: 7 %
Neutro Abs: 5.6 10*3/uL (ref 1.7–7.7)
Neutrophils Relative %: 57 %
Platelets: 605 10*3/uL — ABNORMAL HIGH (ref 150–400)
RBC: 3.97 MIL/uL (ref 3.87–5.11)
RDW: 13.8 % (ref 11.5–15.5)
WBC: 9.7 10*3/uL (ref 4.0–10.5)
nRBC: 0 % (ref 0.0–0.2)

## 2022-11-22 LAB — VITAMIN B12: Vitamin B-12: 633 pg/mL (ref 180–914)

## 2022-11-22 LAB — IRON AND TIBC
Iron: 81 ug/dL (ref 28–170)
Saturation Ratios: 18 % (ref 10.4–31.8)
TIBC: 444 ug/dL (ref 250–450)
UIBC: 363 ug/dL

## 2022-11-22 LAB — VITAMIN D 25 HYDROXY (VIT D DEFICIENCY, FRACTURES): Vit D, 25-Hydroxy: 40.85 ng/mL (ref 30–100)

## 2022-11-22 LAB — FERRITIN: Ferritin: 571 ng/mL — ABNORMAL HIGH (ref 11–307)

## 2022-11-23 ENCOUNTER — Other Ambulatory Visit: Payer: Self-pay | Admitting: Gastroenterology

## 2022-11-23 LAB — RHEUMATOID FACTOR: Rheumatoid fact SerPl-aCnc: 12.1 [IU]/mL (ref <14.0)

## 2022-11-25 ENCOUNTER — Other Ambulatory Visit: Payer: Self-pay | Admitting: Gastroenterology

## 2022-11-25 LAB — ANTINUCLEAR ANTIBODIES, IFA: ANA Ab, IFA: NEGATIVE

## 2022-11-29 ENCOUNTER — Encounter: Payer: Self-pay | Admitting: Oncology

## 2022-11-29 ENCOUNTER — Inpatient Hospital Stay: Payer: 59

## 2022-11-29 ENCOUNTER — Inpatient Hospital Stay (HOSPITAL_BASED_OUTPATIENT_CLINIC_OR_DEPARTMENT_OTHER): Payer: 59 | Admitting: Oncology

## 2022-11-29 VITALS — BP 114/52 | HR 80 | Temp 98.2°F | Resp 17 | Wt 165.4 lb

## 2022-11-29 DIAGNOSIS — D75839 Thrombocytosis, unspecified: Secondary | ICD-10-CM

## 2022-11-29 DIAGNOSIS — D649 Anemia, unspecified: Secondary | ICD-10-CM | POA: Diagnosis not present

## 2022-11-29 DIAGNOSIS — D72829 Elevated white blood cell count, unspecified: Secondary | ICD-10-CM | POA: Diagnosis not present

## 2022-11-29 DIAGNOSIS — D509 Iron deficiency anemia, unspecified: Secondary | ICD-10-CM

## 2022-11-29 MED ORDER — CYANOCOBALAMIN 1000 MCG/ML IJ SOLN
1000.0000 ug | Freq: Once | INTRAMUSCULAR | Status: AC
  Administered 2022-11-29: 1000 ug via INTRAMUSCULAR
  Filled 2022-11-29: qty 1

## 2022-11-29 MED ORDER — ONDANSETRON HCL 8 MG PO TABS
8.0000 mg | ORAL_TABLET | Freq: Three times a day (TID) | ORAL | 2 refills | Status: AC | PRN
Start: 2022-11-29 — End: ?

## 2022-11-29 MED ORDER — CYANOCOBALAMIN 1000 MCG/ML IJ SOLN
1000.0000 ug | INTRAMUSCULAR | 0 refills | Status: DC
Start: 2022-11-29 — End: 2023-04-29

## 2022-11-29 NOTE — Progress Notes (Unsigned)
[facility] [address], [place] [address]   CLINIC:  Medical Oncology/Hematology  PCP:  Exie Parody [address] [place] [address] [phone]   REASON FOR VISIT:  Follow-up for leukocytosis and thrombocytosis  PRIOR THERAPY: None  CURRENT THERAPY: Observation  INTERVAL HISTORY:  Kristen [name] 52 y.o. female returns for routine follow-up of leukocytosis.  She was last seen in our clinic on 10/18/22.    When she was last seen, she had a myriad of complaints including recurrent diverticulitis flares (July 2024), fatigue, bilateral lower extremity pain, nausea (Zofran), dizziness, vertigo (meclizine), insomnia, constipation (stool softener daily), neck and back pain (on Celebrex and gabapentin) and pain with intercourse.  Platelets continue to be relatively elevated and she was still mildly anemic.  Her son was recently diagnosed with JAK2 mutation and her PCP recommended she see a hematologist given her platelets are intermittently high.  She was referred to Dr. Candise Che at [facility] in [place].  All labs returned normal.   [facility]   REVIEW OF SYSTEMS:  Review of Systems  Constitutional:  Positive for appetite change, chills and fatigue.  Respiratory:  Positive for cough.   Gastrointestinal:  Positive for constipation.  Endocrine: Positive for hot flashes.  Genitourinary:  Positive for bladder incontinence and pelvic pain. Negative for dysuria, frequency and hematuria.   Musculoskeletal:  Positive for arthralgias, back pain, myalgias and neck pain.  Neurological:  Positive for dizziness, headaches and light-headedness.  Psychiatric/Behavioral:  Positive for sleep disturbance. The patient is nervous/anxious.     PAST MEDICAL/SURGICAL HISTORY:  Past Medical History:  Diagnosis Date   Allergies    Anemia    Anxiety    Arthritis    Atypical mole 11/03/2006   mid upper back (slight to moderate)   Atypical mole  11/03/2006   lower right back (slight to moderate)   Back pain    Bipolar 1 disorder (HCC)    Borderline diabetic    Chronic constipation    Depression    Diabetes (HCC)    Diverticulitis    Family history of adverse reaction to anesthesia    mother-- ponv   Fatty liver    GAD (generalized anxiety disorder)    GERD (gastroesophageal reflux disease)    Hiatal hernia    High triglycerides    History of kidney stones    History of recurrent UTIs    History of sepsis 06/2018   History of stomach ulcers    History of suicidal ideation    Hyperlipidemia    Hypothyroidism    IDA (iron deficiency anemia)    Joint pain    Melanoma (HCC) 11/03/2006   left chest in situ (excision)   Orthostasis    Palpitations    PONV (postoperative nausea and vomiting)    Rapid heart rate    Renal calculus, left    Seasonal allergies    Shortness of breath    Sleep disorder, unspecified    per excessive sleeping during the day   Snoring    Suicide ideation    Swallowing difficulty    Tachycardia    Vertigo    Vitamin D deficiency    Weakness    Wears contact lenses    Past Surgical History:  Procedure Laterality Date   ANTERIOR CERVICAL DECOMP/DISCECTOMY FUSION  02/17/2012   Procedure: ANTERIOR CERVICAL DECOMPRESSION/DISCECTOMY FUSION 2 LEVELS;  Surgeon: Hewitt Shorts, MD;  Location: [facility];  Service: Neurosurgery;  Laterality: N/A;  Cervical four-five and Cervical six-seven anterior cervial decompression with fusion plating and bonegraft   ANTERIOR CERVICAL DECOMP/DISCECTOMY FUSION  04-01-2001   @[facility]    C5---6   BIOPSY  07/20/2018   Procedure: BIOPSY;  Surgeon: Corbin Ade, MD;  Location: [facility];  Service: Endoscopy;;  gastric    BREAST REDUCTION SURGERY Bilateral 1995   CESAREAN SECTION  x2   last one 1998   with BILATERAL TUBAL LIGATION   CESAREAN SECTION WITH BILATERAL TUBAL LIGATION     COLONOSCOPY WITH PROPOFOL N/A 07/20/2018   Dr. Jena Gauss: Diverticulosis,  internal hemorrhoids, 5 mm splenic flexure tubular adenoma removed. Repeat in 5 years due to family history.   COLONOSCOPY WITH PROPOFOL N/A 12/07/2020   Procedure: COLONOSCOPY WITH PROPOFOL;  Surgeon: Corbin Ade, MD;  Location: [facility];  Service: Endoscopy;  Laterality: N/A;  2:30pm   CYSTOSCOPY/URETEROSCOPY/HOLMIUM LASER/STENT PLACEMENT Left 08/21/2018   Procedure: CYSTOSCOPY LEFT RETROGRADE PYELOGRAM /URETEROSCOPY/HOLMIUM LASER/STENT PLACEMENT;  Surgeon: Rene Paci, MD;  Location: [facility];  Service: Urology;  Laterality: Left;   DILATION AND CURETTAGE OF UTERUS  1992   for miscarriage   ENDOMETRIAL ABLATION  2014   ESOPHAGOGASTRODUODENOSCOPY     20 years ago.    ESOPHAGOGASTRODUODENOSCOPY (EGD) WITH PROPOFOL N/A 07/20/2018   Dr. Jena Gauss: Small hiatal hernia, erythematous mucosa in the stomach with a healing gastric ulcer measuring 1 cm, biopsies negative.   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   POLYPECTOMY  07/20/2018   Procedure: POLYPECTOMY;  Surgeon: Corbin Ade, MD;  [facility];  Service: Endoscopy;;   POSTERIOR CERVICAL FUSION/FORAMINOTOMY N/A 08/18/2013   Procedure: CERVICAL FOUR TO CERVICAL SEVEN POSTERIOR CERVICAL FUSION/FORAMINOTOMY LEVEL 3;  Surgeon: Hewitt Shorts, MD;  Location: [facility];  Service: Neurosurgery;  Laterality: N/A;  C4-7 posterior cervical fusion with lateral mass fixation   RIGHT/LEFT HEART CATH AND CORONARY ANGIOGRAPHY N/A 12/23/2019   Procedure: RIGHT/LEFT HEART CATH AND  CORONARY ANGIOGRAPHY;  Surgeon: Kathleene Hazel, MD;  Location: [facility];  Service: Cardiovascular;  Laterality: N/A;     SOCIAL HISTORY:  Social History   Socioeconomic History   Marital status: Married    Spouse name: Not on file   Number of children: 2   Years of education: Not on file   Highest education level: Not on file  Occupational History   Occupation: disability  Tobacco Use   Smoking status: Former    Current packs/day: 0.00    Average packs/day: 1 pack/day for 5.0 years (5.0 ttl pk-yrs)    Types: Cigarettes    Start date: 02/09/1997    Quit date: 02/09/2002    Years since quitting: 20.8    Passive exposure: Past   Smokeless tobacco: Never  Vaping Use   Vaping status: Never Used  Substance and Sexual Activity   Alcohol use: No   Drug use: No   Sexual activity: Yes    Partners: Male    Birth control/protection: Surgical    Comment: spouse  Other  Topics Concern   Not on file  Social History Narrative   Step brother-2   Step sister-2   Half sister -1 healthy   Social Determinants of Health   Financial Resource Strain: Low Risk  (09/21/2022)   [facility]   Overall Financial Resource Strain (CARDIA)    Difficulty of Paying Living Expenses: Not hard at all  Food Insecurity: No Food Insecurity (09/21/2022)   Received from [facility]   Hunger Vital Sign    Worried About Running Out of Food in the Last Year: Never true    Ran Out of Food in the Last Year: Never true  Transportation Needs: No Transportation Needs (09/21/2022)   Received [facility] - Transportation    Lack of Transportation (Medical): No    Lack of Transportation (Non-Medical): No  Physical Activity: Sufficiently Active (09/21/2022)   Received from [facility]   Exercise Vital Sign    Days of Exercise per Week: 5 days    Minutes of Exercise per Session: 30 min  Stress: No Stress Concern Present (09/21/2022)   Received from [facility] - Occupational Stress Questionnaire    Feeling of Stress : Not at all  Social Connections: Socially Integrated (09/21/2022)   Received from [facility]   Social Connection and Isolation Panel [NHANES]    Frequency of Communication with Friends and Family: More than three times a week    Frequency of Social Gatherings with Friends and Family: Three times a week    Attends Religious Services: More than 4 times per year    Active Member of Clubs or Organizations: Yes    Attends Banker Meetings: 1 to 4 times per year    Marital Status: Married  Catering manager Violence: Not At Risk (09/21/2022)   Received from [facility]   Humiliation, Afraid, Rape, and Kick questionnaire    Fear of Current or Ex-Partner: No    Emotionally Abused: No    Physically Abused: No    Sexually Abused: No    FAMILY HISTORY:  Family History  Problem Relation Age of Onset   Asthma Mother    Rheum arthritis Mother    Non-Hodgkin's lymphoma Mother    Congestive Heart Failure Mother    Atrial fibrillation Mother    Diabetes Mother    Hyperlipidemia Mother    Cancer Mother    Colon cancer Father    Multiple myeloma Father    Hypertension Father    Cancer Father    Depression Father    Anxiety disorder Father    Alcoholism Father    Obesity Father    Diabetes Maternal Grandmother    Congestive Heart Failure Maternal Grandmother    Mental illness Paternal Grandmother    Colon cancer Paternal Grandfather    Bone cancer Paternal Grandfather    Allergies Other        entire family(mom and dad)   Liver disease Neg Hx     CURRENT MEDICATIONS:  Outpatient Encounter Medications as of 11/29/2022  Medication Sig   acetaminophen (TYLENOL) 500 MG tablet Take 1,000 mg by mouth every 6 (six) hours as needed for moderate pain.   albuterol (PROVENTIL) (2.5 MG/3ML) 0.083% nebulizer solution    albuterol (VENTOLIN HFA) 108 (90 Base)  MCG/ACT inhaler Inhale 2 puffs into the lungs every 6 (six) hours as needed for wheezing or shortness of breath.  ALPRAZolam (XANAX) 1 MG tablet Take 1 tablet (1 mg total) by mouth 4 (four) times daily as needed for anxiety.   aspirin EC 81 MG tablet Take 1 tablet (81 mg total) by mouth daily. Swallow whole.   buPROPion (WELLBUTRIN XL) 150 MG 24 hr tablet Take 3 tablets (450 mg total) by mouth daily.   busPIRone (BUSPAR) 15 MG tablet Take 2 tablets by mouth twice daily   celecoxib (CELEBREX) 100 MG capsule Take 100 mg by mouth daily. (Patient not taking: Reported on 10/28/2022)   cetirizine (ZYRTEC) 10 MG tablet Take 10 mg by mouth daily.   cholecalciferol (VITAMIN D3) 25 MCG (1000 UNIT) tablet Take 2,000 Units by mouth daily.   fenofibrate (TRICOR) 145 MG tablet Take 1 tablet (145 mg total) by mouth daily.   fluticasone (FLONASE) 50 MCG/ACT nasal spray Place 1 spray into both nostrils daily.   gabapentin (NEURONTIN) 300 MG capsule Take 1 capsule (300 mg total) by mouth 3 (three) times daily. (Patient not taking: Reported on 10/28/2022)   hydrocortisone (ANUSOL-HC) 2.5 % rectal cream Place 1 application rectally 2-4 times daily for rectal bleeding/burning related to hemorrhoids.   hydrOXYzine (ATARAX) 50 MG tablet TAKE 1 TABLET BY MOUTH EVERY 6 HOURS AS NEEDED   l-methylfolate-B6-B12 (METANX) 3-35-2 MG TABS tablet Take 1 tablet by mouth daily. (Patient not taking: Reported on 10/28/2022)   lamoTRIgine (LAMICTAL) 200 MG tablet TAKE 2 & 1/2 (TWO & ONE-HALF) TABLETS BY MOUTH ONCE DAILY   levocetirizine (XYZAL) 5 MG tablet Take 5 mg by mouth every evening.   levothyroxine (SYNTHROID) 75 MCG tablet Take 75 mcg by mouth daily.   loperamide (IMODIUM A-D) 2 MG tablet Take 1 tablet (2 mg total) by mouth 4 (four) times daily as needed for diarrhea or loose stools.   meclizine (ANTIVERT) 25 MG tablet Take 25 mg by mouth 3 (three) times daily as needed for dizziness.   metFORMIN (GLUCOPHAGE-XR) 750 MG 24 hr  tablet Take 750 mg by mouth daily.   ondansetron (ZOFRAN-ODT) 8 MG disintegrating tablet Take 1 tablet (8 mg total) by mouth 2 (two) times daily as needed for nausea or vomiting.   Oxcarbazepine (TRILEPTAL) 300 MG tablet Take 3 tablets (900 mg total) by mouth at bedtime.   pantoprazole (PROTONIX) 40 MG tablet TAKE 1 TABLET BY MOUTH TWICE DAILY WITH A MEAL (Patient taking differently: Take 40 mg by mouth daily.)   Peppermint Oil (IBGARD) 90 MG CPCR Take as directed   rosuvastatin (CRESTOR) 5 MG tablet Take 5 mg by mouth daily.   Semaglutide, 2 MG/DOSE, (OZEMPIC, 2 MG/DOSE,) 8 MG/3ML SOPN Inject 2 mg into the skin once a week.   sertraline (ZOLOFT) 100 MG tablet TAKE 3 TABLETS BY MOUTH AT BEDTIME   Tenapanor HCl (IBSRELA) 50 MG TABS Take 1 tablet by mouth 2 (two) times daily.   valACYclovir (VALTREX) 500 MG tablet Take 500 mg by mouth daily.   No facility-administered encounter medications on file as of 11/29/2022.    ALLERGIES:  Allergies  Allergen Reactions   Iohexol Hives     Desc: IVP DYE Also developed hives when drinking this contrast responded to benadryl.  Desc: IVP DYE  Desc: IVP DYE Also developed hives when drinking this contrast responded to benadryl.   Rosuvastatin Other (See Comments)    Muscle aches with 10mg  and 20mg , tolerates 5mg  daily without issues Muscle aches with 20mg   Muscle aches with 10mg  and 20mg , tolerates 5mg  daily without issues    Diatrizoate Other (  See Comments)     PHYSICAL EXAM:  ECOG PERFORMANCE STATUS: 1 - Symptomatic but completely ambulatory  There were no vitals filed for this visit. There were no vitals filed for this visit. Physical Exam Constitutional:      Appearance: Normal appearance.  Cardiovascular:     Rate and Rhythm: Normal rate and regular rhythm.  Pulmonary:     Effort: Pulmonary effort is normal.     Breath sounds: Normal breath sounds.  Abdominal:     General: Bowel sounds are normal.     Palpations: Abdomen is soft.   Musculoskeletal:        General: No swelling. Normal range of motion.  Neurological:     Mental Status: She is alert and oriented to person, place, and time. Mental status is at baseline.      LABORATORY DATA:  I have reviewed the labs as listed.  CBC    Component Value Date/Time   WBC 9.7 11/22/2022 1043   RBC 3.97 11/22/2022 1043   HGB 11.7 (L) 11/22/2022 1043   HGB 12.0 06/06/2022 1029   HGB 12.9 12/17/2019 1052   HCT 36.0 11/22/2022 1043   HCT 39.0 12/17/2019 1052   PLT 605 (H) 11/22/2022 1043   PLT 445 (H) 06/06/2022 1029   PLT 425 12/17/2019 1052   MCV 90.7 11/22/2022 1043   MCV 86 12/17/2019 1052   MCH 29.5 11/22/2022 1043   MCHC 32.5 11/22/2022 1043   RDW 13.8 11/22/2022 1043   RDW 13.5 12/17/2019 1052   LYMPHSABS 3.0 11/22/2022 1043   LYMPHSABS 3.0 12/17/2019 1052   MONOABS 0.6 11/22/2022 1043   EOSABS 0.3 11/22/2022 1043   EOSABS 0.8 (H) 12/17/2019 1052   BASOSABS 0.1 11/22/2022 1043   BASOSABS 0.1 12/17/2019 1052      Latest Ref Rng & Units 11/22/2022   10:43 AM 10/18/2022   10:21 AM 06/06/2022   10:29 AM  CMP  Glucose 70 - 99 mg/dL 78  87  77   BUN 6 - 20 mg/dL 24  23  20    Creatinine 0.44 - 1.00 mg/dL 1.61  0.96  0.45   Sodium 135 - 145 mmol/L 138  140  143   Potassium 3.5 - 5.1 mmol/L 3.9  3.9  3.9   Chloride 98 - 111 mmol/L 104  104  108   CO2 22 - 32 mmol/L 26  25  29    Calcium 8.9 - 10.3 mg/dL 9.8  40.9  81.1   Total Protein 6.5 - 8.1 g/dL 7.8  7.6  7.2   Total Bilirubin 0.3 - 1.2 mg/dL 0.5  0.6  0.4   Alkaline Phos 38 - 126 U/L 70  67  63   AST 15 - 41 U/L 20  20  24    ALT 0 - 44 U/L 17  18  17      DIAGNOSTIC IMAGING:  I have independently reviewed the relevant imaging and discussed with the patient.  ASSESSMENT & PLAN: 1.  Neutrophilic leukocytosis and thrombocytosis - JAK2 CALR, and MPL testing was negative; negative BCR/ABL FISH testing; normal LDH, negative SPEP testing - History of intermittent neutrophilic leukocytosis since 2009,  with WBC up to to 20.5 -Repeat JAK2 CALR and MPL testing was negative on 06/06/2022. -Most recent lab work from 3 weeks ago while hospitalized at [facility] showed a white count of 13.0, hemoglobin 10.6 and platelet count of 528.  Absolute neutrophil count 8.2. - Ultrasound of the abdomen on 05/27/2019 showed probable fatty  infiltration of the liver with minimal splenic enlargement measuring 12.1 cm in length with volume calculated to 502 mL.  - She has had 3 diverticulitis flares in 2023 and 1 in 2024 treated most recently at [facility]. -Repeat labs from 10/18/2022 show white count of 9.9 with a hemoglobin of 12.1.  Platelet count 487.  Calcium 10.5.  LFTs normal.  Iron saturation 21%, TIBC elevated at 489.  Folate greater than 40.  Ferritin 153.  Vitamin B12 1106. -She received 1 g IV iron (INFeD) on 10/29/2022   2.  Family history: -Father had myeloma and paternal grandfather also had myeloma.  Mother had non-Hodgkin's lymphoma.  Maternal uncle had non-Hodgkin's lymphoma.  Paternal aunt had colon cancer.  3.  Elevated B12 and folate levels: - She is currently not taking B12 or folic acid supplements. -Repeat vitamin B12 levels are 633 (1106) and folate was greater than 40. -Would recommend continuing folic acid and B12 supplements.  4.  Bilateral lower extremity pain/cramping: -  5.  Neck and back pain: -Would like to stop taking gabapentin and Celebrex. -Would follow-up with pain clinic as needed.  6.  Dizziness: -Continue meclizine and Zofran for nausea.    PLAN SUMMARY & DISPOSITION: PLAN SUMMARY: >> Recheck labs today. >> Discussed going ahead and scheduling her 1 g IV iron based on [facility] labs. >> Follow-up in 6 weeks with labs a few days before (CBC with differential, CMP, ferritin, iron panel, B12).      All questions were answered. The patient knows to call the clinic with any problems, questions or concerns.  Medical decision making: High   Time spent on visit:  I spent 40 minutes counseling the patient face to face. The total time spent in the appointment was 20 minutes and more than 50% was on counseling.   Mauro Kaufmann, NP  01/23/2021 10:37 AM

## 2022-11-29 NOTE — Progress Notes (Signed)
Kristen [name] [employer] presents today for injection per the provider's orders.  Vitamin B12 injection administration without incident; injection site WNL; see MAR for injection details.  Patient tolerated procedure well and without incident.  No questions or complaints noted at this time. Patient discharged ambulatory in stable condition.

## 2022-11-29 NOTE — Patient Instructions (Addendum)
[facility]  Discharge Instructions: Thank you for choosing [facility] to provide your oncology and hematology care.  If you have a lab appointment with the Cancer Center - please note that after April 8th, 2024, all labs will be drawn in the cancer center.  You do not have to check in or register with the main entrance as you have in the past but will complete your check-in in the cancer center.  Wear comfortable clothing and clothing appropriate for easy access to any Portacath or PICC line.   We strive to give you quality time with your provider. You may need to reschedule your appointment if you arrive late (15 or more minutes).  Arriving late affects you and other patients whose appointments are after yours.  Also, if you miss three or more appointments without notifying the office, you may be dismissed from the clinic at the provider's discretion.      For prescription refill requests, have your pharmacy contact our office and allow 72 hours for refills to be completed.    Today you received the following Vitamin B12 injection.      To help prevent nausea and vomiting after your treatment, we encourage you to take your nausea medication as directed.  BELOW ARE SYMPTOMS THAT SHOULD BE REPORTED IMMEDIATELY: *FEVER GREATER THAN 100.4 F (38 C) OR HIGHER *CHILLS OR SWEATING *NAUSEA AND VOMITING THAT IS NOT CONTROLLED WITH YOUR NAUSEA MEDICATION *UNUSUAL SHORTNESS OF BREATH *UNUSUAL BRUISING OR BLEEDING *URINARY PROBLEMS (pain or burning when urinating, or frequent urination) *BOWEL PROBLEMS (unusual diarrhea, constipation, pain near the anus) TENDERNESS IN MOUTH AND THROAT WITH OR WITHOUT PRESENCE OF ULCERS (sore throat, sores in mouth, or a toothache) UNUSUAL RASH, SWELLING OR PAIN  UNUSUAL VAGINAL DISCHARGE OR ITCHING   Items with * indicate a potential emergency and should be followed up as soon as possible or go to the Emergency Department if any  problems should occur.  Please show the CHEMOTHERAPY ALERT CARD or IMMUNOTHERAPY ALERT CARD at check-in to the Emergency Department and triage nurse.  Should you have questions after your visit or need to cancel or reschedule your appointment, please contact [facility] [phone]  and follow the prompts.  Office hours are 8:00 a.m. to 4:30 p.m. Monday - Friday. Please note that voicemails left after 4:00 p.m. may not be returned until the following business day.  We are closed weekends and major holidays. You have access to a nurse at all times for urgent questions. Please call the main number to the clinic [phone] and follow the prompts.  For any non-urgent questions, you may also contact your provider using MyChart. We now offer e-Visits for anyone 16 and older to request care online for non-urgent symptoms. For details visit [email or web address].   Also download the MyChart app! Go to the app store, search "MyChart", open the app, select [place], and log in with your MyChart username and password.  Vitamin B12 Injection What is this medication? Vitamin B12 (VAHY tuh min B12) prevents and treats low vitamin B12 levels in your body. It is used in people who do not get enough vitamin B12 from their diet or when their digestive tract does not absorb enough. Vitamin B12 plays an important role in maintaining the health of your nervous system and red blood cells. This medicine may be used for other purposes; ask your health care provider or pharmacist if you have questions. COMMON BRAND NAME(S): B-12  Compliance Kit, B-12 Injection Kit, Cyomin, Dodex, LA-12, Nutri-Twelve, Physicians EZ Use B-12, Primabalt, Vitamin Deficiency Injectable System - B12 What should I tell my care team before I take this medication? They need to know if you have any of these conditions: Kidney disease Leber's disease Megaloblastic anemia An unusual or allergic reaction to cyanocobalamin,  cobalt, other medications, foods, dyes, or preservatives Pregnant or trying to get pregnant Breast-feeding How should I use this medication? This medication is injected into a muscle or deeply under the skin. It is usually given in a clinic or care team's office. However, your care team may teach you how to inject yourself. Follow all instructions. Talk to your care team about the use of this medication in children. Special care may be needed. Overdosage: If you think you have taken too much of this medicine contact a poison control center or emergency room at once. NOTE: This medicine is only for you. Do not share this medicine with others. What if I miss a dose? If you are given your dose at a clinic or care team's office, call to reschedule your appointment. If you give your own injections, and you miss a dose, take it as soon as you can. If it is almost time for your next dose, take only that dose. Do not take double or extra doses. What may interact with this medication? Alcohol Colchicine This list may not describe all possible interactions. Give your health care provider a list of all the medicines, herbs, non-prescription drugs, or dietary supplements you use. Also tell them if you smoke, drink alcohol, or use illegal drugs. Some items may interact with your medicine. What should I watch for while using this medication? Visit your care team regularly. You may need blood work done while you are taking this medication. You may need to follow a special diet. Talk to your care team. Limit your alcohol intake and avoid smoking to get the best benefit. What side effects may I notice from receiving this medication? Side effects that you should report to your care team as soon as possible: Allergic reactions--skin rash, itching, hives, swelling of the face, lips, tongue, or throat Swelling of the ankles, hands, or feet Trouble breathing Side effects that usually do not require medical attention  (report to your care team if they continue or are bothersome): Diarrhea This list may not describe all possible side effects. Call your doctor for medical advice about side effects. You may report side effects to FDA at 1-800-FDA-1088. Where should I keep my medication? Keep out of the reach of children. Store at room temperature between 15 and 30 degrees C (59 and 85 degrees F). Protect from light. Throw away any unused medication after the expiration date. NOTE: This sheet is a summary. It may not cover all possible information. If you have questions about this medicine, talk to your doctor, pharmacist, or health care provider.  2024 Elsevier/Gold Standard (2020-10-10 00:00:00)

## 2022-12-02 ENCOUNTER — Encounter: Payer: Self-pay | Admitting: Hematology

## 2022-12-02 NOTE — Telephone Encounter (Signed)
Hey. I will take a look at them. Apparently son was recently diagnosed with Jak 2 mutation

## 2022-12-03 NOTE — Progress Notes (Unsigned)
ASSESSMENT    Brief Narrative:  52 y.o.  female known to Dr. Adela Lank with a past medical history not limited to sigmoid diverticulitis ( CT scan Nov 2023), duodenal diverticulitis, chronic constipation, C-difficile, GERD, renal stones, bipolar disorder, melanoma, anxiety  [facility] of colon cancer.  CRC in father at age 97, aunt at age 8, paternal grandmother also had colon cancer.   Colon cancer screening.  No polyps / cancers on colonoscopy Oct 2022.   Leukocytosis and thrombocytosis Followed by Hematology  New anemia, not clearly iron deficient.  Baseline hgb ~ 12. Per Hematology's office note 10/18/22 patient's hgb  was 10.3 at [facility] in August while hospitalized with diverticulitis. Hematology repeated labs on 9/6  and hgb was 12.1, TIBC elevated with normal iron sat%. Hematology gave her IV iron. Repeat hgb on 10/11 was 11.7 Normal B12 level  See PMH below for additional history  PLAN   7 year colonoscopy due Oct 2029      HPI   Chief complaint :  Patient last seen in the office Jan 2024 for bloating, chronic constipation, nausea.   Prior EGD negative, CT scan without clear source otherwise, workup for biliary colic negative, no gallstones, HIDA negative.  She was taking Ozempic which given myriad symptoms we really didn't thinks this was a good medication for her.   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 if bloating persists despite treating constipation, may consider tral of Rifaximin        Procedure risk assessment:  No history of CHF.  No supplemental 02 use at home.  Not a known difficult airway Anticoagulant:     GI History / Pertinent GI Studies   **All endoscopic studies may not be included here    Aug 2022 EGD for dysphagia --Normal. Empirically dilated with [facility] dilator  Colonoscopy Oct 2022 - Adequate prep --Diverticulosis in  entire colon. Exam otherwise normal.       Latest Ref Rng & Units 11/22/2022   10:43 AM 10/18/2022   10:21 AM 06/06/2022   10:29 AM  Hepatic Function  Total Protein 6.5 - 8.1 g/dL 7.8  7.6  7.2   Albumin 3.5 - 5.0 g/dL 4.5  4.6  4.9   AST 15 - 41 U/L 20  20  24    ALT 0 - 44 U/L 17  18  17    Alk Phosphatase 38 - 126 U/L 70  67  63   Total Bilirubin 0.3 - 1.2 mg/dL 0.5  0.6  0.4        Latest Ref Rng & Units 11/22/2022   10:43 AM 10/18/2022   10:21 AM 06/06/2022   10:29 AM  CBC  WBC 4.0 - 10.5 K/uL 9.7  9.9  7.8   Hemoglobin 12.0 - 15.0 g/dL 16.1  09.6  04.5   Hematocrit 36.0 - 46.0 % 36.0  37.4  36.2   Platelets 150 - 400 K/uL 605  487  445      Past Medical History:  Diagnosis Date   Allergies    Anemia    Anxiety    Arthritis    Atypical mole 11/03/2006   mid upper back (slight to moderate)   Atypical mole 11/03/2006   lower right back (slight to moderate)   Back pain    Bipolar 1 disorder (HCC)    Borderline diabetic    Chronic constipation  Depression    Diabetes (HCC)    Diverticulitis    Family history of adverse reaction to anesthesia    mother-- ponv   Fatty liver    GAD (generalized anxiety disorder)    GERD (gastroesophageal reflux disease)    Hiatal hernia    High triglycerides    History of kidney stones    History of recurrent UTIs    History of sepsis 06/2018   History of stomach ulcers    History of suicidal ideation    Hyperlipidemia    Hypothyroidism    IDA (iron deficiency anemia)    Joint pain    Melanoma (HCC) 11/03/2006   left chest in situ (excision)   Orthostasis    Palpitations    PONV (postoperative nausea and vomiting)    Rapid heart rate    Renal calculus, left    Seasonal allergies    Shortness of breath    Sleep disorder, unspecified    per excessive sleeping during the day   Snoring    Suicide ideation    Swallowing difficulty    Tachycardia    Vertigo    Vitamin D deficiency    Weakness    Wears contact lenses      Past Surgical History:  Procedure Laterality Date   ANTERIOR CERVICAL DECOMP/DISCECTOMY FUSION  02/17/2012   Procedure: ANTERIOR CERVICAL DECOMPRESSION/DISCECTOMY FUSION 2 LEVELS;  Surgeon: Hewitt Shorts, MD;  Location: [facility];  Service: Neurosurgery;  Laterality: N/A;  Cervical four-five and Cervical six-seven anterior cervial decompression with fusion plating and bonegraft   ANTERIOR CERVICAL DECOMP/DISCECTOMY FUSION  04-01-2001   @[facility]    C5---6   BIOPSY  07/20/2018   Procedure: BIOPSY;  Surgeon: Corbin Ade, MD;  Location: [facility];  Service: Endoscopy;;  gastric    BREAST REDUCTION SURGERY Bilateral 1995   CESAREAN SECTION  x2   last one 1998   with BILATERAL TUBAL LIGATION   CESAREAN SECTION WITH BILATERAL TUBAL LIGATION     COLONOSCOPY WITH PROPOFOL N/A 07/20/2018   Dr. Jena Gauss: Diverticulosis, internal hemorrhoids, 5 mm splenic flexure tubular adenoma removed. Repeat in 5 years due to family history.   COLONOSCOPY WITH PROPOFOL N/A 12/07/2020   Procedure: COLONOSCOPY WITH PROPOFOL;  Surgeon: Corbin Ade, MD;  Location: [facility];  Service: Endoscopy;  Laterality: N/A;  2:30pm   CYSTOSCOPY/URETEROSCOPY/HOLMIUM LASER/STENT PLACEMENT Left 08/21/2018   Procedure: CYSTOSCOPY LEFT RETROGRADE PYELOGRAM /URETEROSCOPY/HOLMIUM LASER/STENT PLACEMENT;  Surgeon: Rene Paci, MD;  Location: [facility];  Service: Urology;  Laterality: Left;   DILATION AND CURETTAGE OF UTERUS  1992   for miscarriage   ENDOMETRIAL ABLATION  2014   ESOPHAGOGASTRODUODENOSCOPY     20 years ago.    ESOPHAGOGASTRODUODENOSCOPY (EGD) WITH PROPOFOL N/A 07/20/2018   Dr. Jena Gauss: Small hiatal hernia, erythematous mucosa in the stomach with a healing gastric ulcer measuring 1 cm, biopsies negative.   ESOPHAGOGASTRODUODENOSCOPY (EGD) WITH PROPOFOL N/A 10/05/2020   Procedure: ESOPHAGOGASTRODUODENOSCOPY (EGD) WITH PROPOFOL;  Surgeon: Corbin Ade, MD;  [facility];  Service: Endoscopy;  Laterality: N/A;   MALONEY DILATION N/A 10/05/2020   Procedure: Elease Hashimoto DILATION;  Surgeon: Corbin Ade, MD;  Location: [facility];  Service: Endoscopy;  Laterality: N/A;   MICROTUBOPLASTY  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   POLYPECTOMY  07/20/2018   Procedure: POLYPECTOMY;  Surgeon: Corbin Ade, MD;  [facility]: [facility];  Service: Endoscopy;;   POSTERIOR CERVICAL FUSION/FORAMINOTOMY N/A 08/18/2013   Procedure: CERVICAL FOUR TO CERVICAL SEVEN POSTERIOR CERVICAL FUSION/FORAMINOTOMY LEVEL 3;  Surgeon: Hewitt Shorts, MD;  Location: [facility];  Service: Neurosurgery;  Laterality: N/A;  C4-7 posterior cervical fusion with lateral mass fixation   RIGHT/LEFT HEART CATH AND CORONARY ANGIOGRAPHY N/A 12/23/2019   Procedure: RIGHT/LEFT HEART CATH AND CORONARY ANGIOGRAPHY;  Surgeon: Kathleene Hazel, MD;  Location: [facility];  Service: Cardiovascular;  Laterality: N/A;    Family History  Problem Relation Age of Onset   Asthma Mother    Rheum arthritis Mother    Non-Hodgkin's lymphoma Mother    Congestive Heart Failure Mother    Atrial fibrillation Mother    Diabetes Mother    Hyperlipidemia Mother    Cancer Mother    Colon cancer Father    Multiple myeloma Father    Hypertension Father    Cancer Father    Depression Father    Anxiety disorder Father    Alcoholism Father    Obesity Father    Diabetes Maternal Grandmother    Congestive Heart Failure Maternal Grandmother    Mental illness Paternal Grandmother    Colon cancer Paternal Grandfather    Bone cancer Paternal Grandfather    Allergies Other        entire family(mom and dad)   Liver disease Neg Hx     Current Medications, Allergies, Family History and Social History were reviewed in [employer] electronic medical record.     Current Outpatient Medications  Medication Sig Dispense Refill   acetaminophen (TYLENOL) 500 MG  tablet Take 1,000 mg by mouth every 6 (six) hours as needed for moderate pain.     albuterol (PROVENTIL) (2.5 MG/3ML) 0.083% nebulizer solution      albuterol (VENTOLIN HFA) 108 (90 Base) MCG/ACT inhaler Inhale 2 puffs into the lungs every 6 (six) hours as needed for wheezing or shortness of breath. 18 g 1   ALPRAZolam (XANAX) 1 MG tablet Take 1 tablet (1 mg total) by mouth 4 (four) times daily as needed for anxiety. 120 tablet 5   aspirin EC 81 MG tablet Take 1 tablet (81 mg total) by mouth daily. Swallow whole. 30 tablet 11   buPROPion (WELLBUTRIN XL) 150 MG 24 hr tablet Take 3 tablets (450 mg total) by mouth daily. 270 tablet 3   busPIRone (BUSPAR) 15 MG tablet Take 2 tablets by mouth twice daily 120 tablet 1   celecoxib (CELEBREX) 100 MG capsule Take 100 mg by mouth daily.     cetirizine (ZYRTEC) 10 MG tablet Take 10 mg by mouth daily.     cholecalciferol (VITAMIN D3) 25 MCG (1000 UNIT) tablet Take 2,000 Units by mouth daily.     cyanocobalamin (VITAMIN B12) 1000 MCG/ML injection Inject 1 mL (1,000 mcg total) into the muscle once a week. 1 ml weekly X 4 and monthly X 6 10 mL 0   fenofibrate (TRICOR) 145 MG tablet Take 1 tablet (145 mg total) by mouth daily. 90 tablet 3   fluticasone (FLONASE) 50 MCG/ACT nasal spray Place 1 spray into both nostrils daily.     gabapentin (NEURONTIN) 300 MG capsule Take 1 capsule (300 mg total) by mouth 3 (three) times daily.     hydrocortisone (ANUSOL-HC) 2.5 % rectal cream Place 1 application rectally 2-4 times daily for rectal bleeding/burning related to hemorrhoids. 30 g 1   hydrOXYzine (ATARAX) 50 MG tablet TAKE 1 TABLET BY MOUTH EVERY 6  HOURS AS NEEDED 120 tablet 1   l-methylfolate-B6-B12 (METANX) 3-35-2 MG TABS tablet Take 1 tablet by mouth daily. 30 tablet 1   lamoTRIgine (LAMICTAL) 200 MG tablet TAKE 2 & 1/2 (TWO & ONE-HALF) TABLETS BY MOUTH ONCE DAILY 225 tablet 0   levocetirizine (XYZAL) 5 MG tablet Take 5 mg by mouth every evening.     levothyroxine  (SYNTHROID) 75 MCG tablet Take 75 mcg by mouth daily.     loperamide (IMODIUM A-D) 2 MG tablet Take 1 tablet (2 mg total) by mouth 4 (four) times daily as needed for diarrhea or loose stools. 40 tablet 0   meclizine (ANTIVERT) 25 MG tablet Take 25 mg by mouth 3 (three) times daily as needed for dizziness.     metFORMIN (GLUCOPHAGE-XR) 750 MG 24 hr tablet Take 750 mg by mouth daily.     ondansetron (ZOFRAN) 8 MG tablet Take 1 tablet (8 mg total) by mouth every 8 (eight) hours as needed for nausea or vomiting. 90 tablet 2   ondansetron (ZOFRAN-ODT) 8 MG disintegrating tablet Take 1 tablet (8 mg total) by mouth 2 (two) times daily as needed for nausea or vomiting. 60 tablet 5   Oxcarbazepine (TRILEPTAL) 300 MG tablet Take 3 tablets (900 mg total) by mouth at bedtime. 270 tablet 3   pantoprazole (PROTONIX) 40 MG tablet TAKE 1 TABLET BY MOUTH TWICE DAILY WITH A MEAL (Patient taking differently: Take 40 mg by mouth daily.) 180 tablet 2   Peppermint Oil (IBGARD) 90 MG CPCR Take as directed 12 capsule 0   rosuvastatin (CRESTOR) 5 MG tablet Take 5 mg by mouth daily.     Semaglutide, 2 MG/DOSE, (OZEMPIC, 2 MG/DOSE,) 8 MG/3ML SOPN Inject 2 mg into the skin once a week. 3 mL 11   sertraline (ZOLOFT) 100 MG tablet TAKE 3 TABLETS BY MOUTH AT BEDTIME 90 tablet 1   Tenapanor HCl (IBSRELA) 50 MG TABS Take 1 tablet by mouth 2 (two) times daily. 60 tablet 5   valACYclovir (VALTREX) 500 MG tablet Take 500 mg by mouth daily.     No current facility-administered medications for this visit.    Review of Systems: No chest pain. No shortness of breath. No urinary complaints.    Physical Exam  There were no vitals filed for this visit. Wt Readings from Last 3 Encounters:  11/29/22 165 lb 6.4 oz (75 kg)  06/06/22 183 lb 3.2 oz (83.1 kg)  03/14/22 189 lb (85.7 kg)    There were no vitals taken for this visit. Constitutional:  Pleasant, generally well appearing ***female in no acute distress. Psychiatric: Normal  mood and affect. Behavior is normal. EENT: Pupils normal.  Conjunctivae are normal. No scleral icterus. Neck supple.  Cardiovascular: Normal rate, regular rhythm.  Pulmonary/chest: Effort normal and breath sounds normal. No wheezing, rales or rhonchi. Abdominal: Soft, nondistended, nontender. Bowel sounds active throughout. There are no masses palpable. No hepatomegaly. Neurological: Alert and oriented to person place and time.  Extremities: *** edema  Willette Cluster, NP  12/03/2022, 1:04 PM  Cc:  Rick Duff, PA-C

## 2022-12-04 ENCOUNTER — Encounter: Payer: Self-pay | Admitting: Hematology

## 2022-12-04 ENCOUNTER — Encounter: Payer: Self-pay | Admitting: Nurse Practitioner

## 2022-12-04 ENCOUNTER — Ambulatory Visit: Payer: 59 | Admitting: Nurse Practitioner

## 2022-12-04 VITALS — BP 100/70 | HR 73 | Ht 66.0 in | Wt 165.4 lb

## 2022-12-04 DIAGNOSIS — R1012 Left upper quadrant pain: Secondary | ICD-10-CM | POA: Diagnosis not present

## 2022-12-04 DIAGNOSIS — K5732 Diverticulitis of large intestine without perforation or abscess without bleeding: Secondary | ICD-10-CM | POA: Diagnosis not present

## 2022-12-04 DIAGNOSIS — K5909 Other constipation: Secondary | ICD-10-CM

## 2022-12-04 DIAGNOSIS — R110 Nausea: Secondary | ICD-10-CM | POA: Diagnosis not present

## 2022-12-04 NOTE — Patient Instructions (Addendum)
We have sent over a referral to General Surgery.  Continue Ibsrella twice daily.  Contact your doctor regarding stopping Ozempic.  Continue Pantoprazole 30 minutes before dinner.  Contact us if your nausea is not better in a few weeks.  Due to recent changes in healthcare laws, you may see the results of your imaging and laboratory studies on MyChart before your provider has had a chance to review them.  We understand that in some cases there may be results that are confusing or concerning to you. Not all laboratory results come back in the same time frame and the provider may be waiting for multiple results in order to interpret others.  Please give [place] 48 hours in order for your provider to thoroughly review all the results before contacting the office for clarification of your results.   It was a pleasure to see you today!  Thank you for trusting me with your gastrointestinal care!

## 2022-12-05 ENCOUNTER — Encounter: Payer: Self-pay | Admitting: Oncology

## 2022-12-05 ENCOUNTER — Telehealth: Payer: Self-pay | Admitting: Pharmacist

## 2022-12-05 NOTE — Progress Notes (Signed)
Agree with assessment / plan as outlined.  

## 2022-12-05 NOTE — Telephone Encounter (Signed)
Patient called reported she is having some stomach issues - diverticulitis ( persistent nausea- takes Zofran every day, severe constipation, stomach pain) went to GI doctor and she was advised to stop Ozempic. Patient is now curious is there any other non GI affecting weight loss medication that is as effective as Ozemic that she could take?  Discussed GLP1 and how they works. Patient will go off of Ozempic and revisit to this in future.   Reiterated importance of lifestyle innervations for weight loss

## 2022-12-08 ENCOUNTER — Encounter: Payer: Self-pay | Admitting: Gastroenterology

## 2022-12-09 ENCOUNTER — Encounter: Payer: Self-pay | Admitting: Oncology

## 2022-12-09 ENCOUNTER — Encounter: Payer: Self-pay | Admitting: Gastroenterology

## 2022-12-09 MED ORDER — SEMAGLUTIDE (1 MG/DOSE) 4 MG/3ML ~~LOC~~ SOPN: 1.0000 mg | PEN_INJECTOR | SUBCUTANEOUS | 0 refills | Status: DC

## 2022-12-09 NOTE — Telephone Encounter (Signed)
Patient called today, reported her GI symptoms did not improve off 2 mg Ozempic and her GI doctor told her to go back on it if symptoms does not improve off of GLP1 agent. Patient requesting to go back on 1 mg once week dose instead of 2 mg to see if that helps.  Prescription for Ozempic 1 mg once week sent to patient's preferred pharmacy

## 2022-12-18 ENCOUNTER — Encounter: Payer: Self-pay | Admitting: Hematology

## 2022-12-24 ENCOUNTER — Telehealth: Payer: 59 | Admitting: Physician Assistant

## 2022-12-27 ENCOUNTER — Telehealth: Payer: 59 | Admitting: Physician Assistant

## 2022-12-31 ENCOUNTER — Other Ambulatory Visit: Payer: Self-pay | Admitting: Physician Assistant

## 2023-01-01 ENCOUNTER — Encounter: Payer: Self-pay | Admitting: Hematology

## 2023-01-03 ENCOUNTER — Other Ambulatory Visit: Payer: Self-pay | Admitting: Cardiology

## 2023-01-08 ENCOUNTER — Telehealth: Payer: 59 | Admitting: Physician Assistant

## 2023-01-08 ENCOUNTER — Other Ambulatory Visit: Payer: Self-pay | Admitting: Physician Assistant

## 2023-01-22 ENCOUNTER — Encounter: Payer: Self-pay | Admitting: Oncology

## 2023-01-22 NOTE — Telephone Encounter (Signed)
Okay thanks.   Durenda Hurt, NP 01/22/2023 12:57 PM

## 2023-01-23 ENCOUNTER — Encounter: Payer: Self-pay | Admitting: Oncology

## 2023-01-24 NOTE — Telephone Encounter (Signed)
Patient sent message on 01/22/23, patient is messaging back wanting to know, why no response?

## 2023-01-25 ENCOUNTER — Encounter: Payer: Self-pay | Admitting: Gastroenterology

## 2023-01-27 MED ORDER — CIPROFLOXACIN HCL 500 MG PO TABS: 500.0000 mg | ORAL_TABLET | Freq: Two times a day (BID) | ORAL | 0 refills | Status: DC

## 2023-01-27 NOTE — Telephone Encounter (Signed)
I discussed Dr Lanetta Inch recommendations/response with the patient. I have asked that she discontinue Keflex and instead begin Cipro 500 mg twice daily x 7 days along with the Flagyl previously prescribed. In addition, I have asked that she take IBSrela and miralax daily to help with constipation.  I have also advised that we would like her to specifically see one of the colorectal surgeons at [facility] regarding the recurrent diverticulitis episodes to get a second opinion. Patient is agreeable to this. Referral placed to Dr Cliffton Asters or Dr Maisie Fus.  Patient is advised to contact us should her symptoms not improve or fail to resolve following antibiotics.

## 2023-01-27 NOTE — Telephone Encounter (Signed)
See 01/25/23 patient message for additional info.

## 2023-01-27 NOTE — Telephone Encounter (Signed)
PT wants [place] to know tomorrow is last day on flagyl and that she would need a new script if they want her to continue the medication. Please advise.

## 2023-01-28 MED ORDER — METRONIDAZOLE 500 MG PO TABS: 500.0000 mg | ORAL_TABLET | Freq: Two times a day (BID) | ORAL | 0 refills | Status: AC

## 2023-01-28 NOTE — Addendum Note (Signed)
Addended by: Richardson Chiquito on: 01/28/2023 08:17 AM   Modules accepted: Orders

## 2023-01-29 ENCOUNTER — Other Ambulatory Visit: Payer: Self-pay

## 2023-01-29 DIAGNOSIS — D509 Iron deficiency anemia, unspecified: Secondary | ICD-10-CM

## 2023-01-29 NOTE — Telephone Encounter (Signed)
Patient is scheduled to see Dr Cliffton Asters on 03/17/23 at [facility] for 2nd opinion on diverticulitis.

## 2023-01-30 ENCOUNTER — Telehealth: Payer: Self-pay | Admitting: *Deleted

## 2023-01-30 ENCOUNTER — Encounter: Payer: Self-pay | Admitting: Hematology

## 2023-01-30 ENCOUNTER — Inpatient Hospital Stay: Payer: Medicare Other | Attending: Oncology

## 2023-01-30 DIAGNOSIS — M25559 Pain in unspecified hip: Secondary | ICD-10-CM | POA: Diagnosis not present

## 2023-01-30 DIAGNOSIS — D72829 Elevated white blood cell count, unspecified: Secondary | ICD-10-CM | POA: Diagnosis present

## 2023-01-30 DIAGNOSIS — R5383 Other fatigue: Secondary | ICD-10-CM | POA: Diagnosis not present

## 2023-01-30 DIAGNOSIS — Z8249 Family history of ischemic heart disease and other diseases of the circulatory system: Secondary | ICD-10-CM | POA: Insufficient documentation

## 2023-01-30 DIAGNOSIS — K5900 Constipation, unspecified: Secondary | ICD-10-CM | POA: Insufficient documentation

## 2023-01-30 DIAGNOSIS — R200 Anesthesia of skin: Secondary | ICD-10-CM | POA: Insufficient documentation

## 2023-01-30 DIAGNOSIS — M2550 Pain in unspecified joint: Secondary | ICD-10-CM | POA: Insufficient documentation

## 2023-01-30 DIAGNOSIS — Z83438 Family history of other disorder of lipoprotein metabolism and other lipidemia: Secondary | ICD-10-CM | POA: Diagnosis not present

## 2023-01-30 DIAGNOSIS — Z8349 Family history of other endocrine, nutritional and metabolic diseases: Secondary | ICD-10-CM | POA: Insufficient documentation

## 2023-01-30 DIAGNOSIS — G479 Sleep disorder, unspecified: Secondary | ICD-10-CM | POA: Insufficient documentation

## 2023-01-30 DIAGNOSIS — D75839 Thrombocytosis, unspecified: Secondary | ICD-10-CM | POA: Insufficient documentation

## 2023-01-30 DIAGNOSIS — E875 Hyperkalemia: Secondary | ICD-10-CM | POA: Insufficient documentation

## 2023-01-30 DIAGNOSIS — R519 Headache, unspecified: Secondary | ICD-10-CM | POA: Insufficient documentation

## 2023-01-30 DIAGNOSIS — M549 Dorsalgia, unspecified: Secondary | ICD-10-CM | POA: Insufficient documentation

## 2023-01-30 DIAGNOSIS — Z825 Family history of asthma and other chronic lower respiratory diseases: Secondary | ICD-10-CM | POA: Insufficient documentation

## 2023-01-30 DIAGNOSIS — Z79899 Other long term (current) drug therapy: Secondary | ICD-10-CM | POA: Insufficient documentation

## 2023-01-30 DIAGNOSIS — Z800 Family history of malignant neoplasm of digestive organs: Secondary | ICD-10-CM | POA: Insufficient documentation

## 2023-01-30 DIAGNOSIS — Z818 Family history of other mental and behavioral disorders: Secondary | ICD-10-CM | POA: Insufficient documentation

## 2023-01-30 DIAGNOSIS — K5730 Diverticulosis of large intestine without perforation or abscess without bleeding: Secondary | ICD-10-CM | POA: Diagnosis not present

## 2023-01-30 DIAGNOSIS — Z888 Allergy status to other drugs, medicaments and biological substances status: Secondary | ICD-10-CM | POA: Insufficient documentation

## 2023-01-30 DIAGNOSIS — Z833 Family history of diabetes mellitus: Secondary | ICD-10-CM | POA: Insufficient documentation

## 2023-01-30 DIAGNOSIS — Z8719 Personal history of other diseases of the digestive system: Secondary | ICD-10-CM | POA: Diagnosis not present

## 2023-01-30 DIAGNOSIS — M79605 Pain in left leg: Secondary | ICD-10-CM | POA: Diagnosis not present

## 2023-01-30 DIAGNOSIS — R110 Nausea: Secondary | ICD-10-CM | POA: Insufficient documentation

## 2023-01-30 DIAGNOSIS — D649 Anemia, unspecified: Secondary | ICD-10-CM | POA: Insufficient documentation

## 2023-01-30 DIAGNOSIS — Z860101 Personal history of adenomatous and serrated colon polyps: Secondary | ICD-10-CM | POA: Diagnosis not present

## 2023-01-30 DIAGNOSIS — Z8261 Family history of arthritis: Secondary | ICD-10-CM | POA: Insufficient documentation

## 2023-01-30 DIAGNOSIS — Z87891 Personal history of nicotine dependence: Secondary | ICD-10-CM | POA: Diagnosis not present

## 2023-01-30 DIAGNOSIS — R319 Hematuria, unspecified: Secondary | ICD-10-CM | POA: Insufficient documentation

## 2023-01-30 DIAGNOSIS — Z811 Family history of alcohol abuse and dependence: Secondary | ICD-10-CM | POA: Insufficient documentation

## 2023-01-30 DIAGNOSIS — Z808 Family history of malignant neoplasm of other organs or systems: Secondary | ICD-10-CM | POA: Insufficient documentation

## 2023-01-30 DIAGNOSIS — D509 Iron deficiency anemia, unspecified: Secondary | ICD-10-CM

## 2023-01-30 LAB — COMPREHENSIVE METABOLIC PANEL
ALT: 16 U/L (ref 0–44)
AST: 20 U/L (ref 15–41)
Albumin: 4.5 g/dL (ref 3.5–5.0)
Alkaline Phosphatase: 60 U/L (ref 38–126)
Anion gap: 11 (ref 5–15)
BUN: 25 mg/dL — ABNORMAL HIGH (ref 6–20)
CO2: 24 mmol/L (ref 22–32)
Calcium: 10.1 mg/dL (ref 8.9–10.3)
Chloride: 104 mmol/L (ref 98–111)
Creatinine, Ser: 1.02 mg/dL — ABNORMAL HIGH (ref 0.44–1.00)
GFR, Estimated: >60 mL/min (ref >60)
Glucose, Bld: 78 mg/dL (ref 70–99)
Potassium: 4 mmol/L (ref 3.5–5.1)
Sodium: 139 mmol/L (ref 135–145)
Total Bilirubin: 0.4 mg/dL (ref <1.2)
Total Protein: 7.5 g/dL (ref 6.5–8.1)

## 2023-01-30 LAB — CBC WITH DIFFERENTIAL/PLATELET
Abs Immature Granulocytes: 0.06 10*3/uL (ref 0.00–0.07)
Basophils Absolute: 0.1 10*3/uL (ref 0.0–0.1)
Basophils Relative: 1 %
Eosinophils Absolute: 0.2 10*3/uL (ref 0.0–0.5)
Eosinophils Relative: 3 %
HCT: 36.7 % (ref 36.0–46.0)
Hemoglobin: 11.8 g/dL — ABNORMAL LOW (ref 12.0–15.0)
Immature Granulocytes: 1 %
Lymphocytes Relative: 34 %
Lymphs Abs: 2.9 10*3/uL (ref 0.7–4.0)
MCH: 28.9 pg (ref 26.0–34.0)
MCHC: 32.2 g/dL (ref 30.0–36.0)
MCV: 90 fL (ref 80.0–100.0)
Monocytes Absolute: 0.8 10*3/uL (ref 0.1–1.0)
Monocytes Relative: 10 %
Neutro Abs: 4.4 10*3/uL (ref 1.7–7.7)
Neutrophils Relative %: 51 %
Platelets: 586 10*3/uL — ABNORMAL HIGH (ref 150–400)
RBC: 4.08 MIL/uL (ref 3.87–5.11)
RDW: 13.8 % (ref 11.5–15.5)
WBC: 8.5 10*3/uL (ref 4.0–10.5)
nRBC: 0 % (ref 0.0–0.2)

## 2023-01-30 LAB — FERRITIN: Ferritin: 475 ng/mL — ABNORMAL HIGH (ref 11–307)

## 2023-01-30 LAB — IRON AND TIBC
Iron: 88 ug/dL (ref 28–170)
Saturation Ratios: 19 % (ref 10.4–31.8)
TIBC: 469 ug/dL — ABNORMAL HIGH (ref 250–450)
UIBC: 381 ug/dL

## 2023-01-30 NOTE — Telephone Encounter (Signed)
Meredith Pel, NP  Avanell Shackleton, RN; Armbruster, Willaim Rayas, MD Kristen [name], I am looping in Dr. Adela Lank . She did see General Surgery in November and they didn't feel like surgery would help . They thought her main issue was that of constipation, However since then she has apparently been back to [facility] and diagnosed again with diverticulitis ( per patient).Dorma Russell   01/30/23 11:55 AM You routed this conversation to Richardson Chiquito, RN Richardson Chiquito, RN     01/30/23 12:40 PM Note Please see additional phone note dated 01/25/23. Additional plan has already been made. Patient was given antibiotics for diverticulitis and referred to [facility] for second opinion on 03/17/23.

## 2023-01-30 NOTE — Telephone Encounter (Signed)
Meredith Pel, NP  Avanell Shackleton, RN; Armbruster, Willaim Rayas, MD Kristen [name], I am looping in Dr. Adela Lank . She did see General Surgery in November and they didn't feel like surgery would help . They thought her main issue was that of constipation, However since then she has apparently been back to [facility] and diagnosed again with diverticulitis ( per patient).Marland Kitchen

## 2023-01-30 NOTE — Telephone Encounter (Signed)
Please see additional phone note dated 01/25/23. Additional plan has already been made. Patient was given antibiotics for diverticulitis and referred to [facility] for second opinion on 03/17/23.

## 2023-01-31 ENCOUNTER — Other Ambulatory Visit: Payer: Self-pay | Admitting: Cardiology

## 2023-01-31 DIAGNOSIS — E119 Type 2 diabetes mellitus without complications: Secondary | ICD-10-CM

## 2023-02-03 ENCOUNTER — Inpatient Hospital Stay (HOSPITAL_BASED_OUTPATIENT_CLINIC_OR_DEPARTMENT_OTHER): Payer: Medicare Other | Admitting: Oncology

## 2023-02-03 DIAGNOSIS — D75839 Thrombocytosis, unspecified: Secondary | ICD-10-CM

## 2023-02-03 DIAGNOSIS — D509 Iron deficiency anemia, unspecified: Secondary | ICD-10-CM | POA: Diagnosis not present

## 2023-02-03 DIAGNOSIS — D649 Anemia, unspecified: Secondary | ICD-10-CM | POA: Diagnosis not present

## 2023-02-03 DIAGNOSIS — D72829 Elevated white blood cell count, unspecified: Secondary | ICD-10-CM | POA: Diagnosis not present

## 2023-02-03 MED ORDER — CYCLOBENZAPRINE HCL 10 MG PO TABS: 10.0000 mg | ORAL_TABLET | Freq: Three times a day (TID) | ORAL | 0 refills | Status: DC | PRN

## 2023-02-03 MED ORDER — PREDNISONE 20 MG PO TABS: 40.0000 mg | ORAL_TABLET | Freq: Every day | ORAL | 0 refills | Status: DC

## 2023-02-03 NOTE — Progress Notes (Signed)
[facility] [address], [place] [address]   CLINIC:  Medical Oncology/Hematology  PCP:  Exie Parody [address] [place] [address] [phone]   REASON FOR VISIT:  Follow-up for leukocytosis and thrombocytosis  PRIOR THERAPY: None  CURRENT THERAPY: Observation  INTERVAL HISTORY:  Kristen [name] 52 y.o. female returns for routine follow-up of leukocytosis.  She was last seen in our on 11/29/2022.  In the interim, she was seen in the ED for diverticulitis on 01/21/2023.  She presented with left lower quadrant abdominal pain and nausea without diarrhea.  Lab work showed mild leukocytosis, mild hyperkalemia and anemia.  Lactic acid was unremarkable.  CT abdomen/pelvis showed mild diverticulitis.  She was started on Keflex and Flagyl x 7 days.  She was also given Zofran for nausea.  Her gastroenterologist switched her from Keflex to ciprofloxacin along with the Flagyl.  Reports she has a colorectal specialist appointment at the beginning of February due to constipation and frequent diverticulitis flares.  Right now, her symptoms are stable.  Reports she has ongoing fatigue and left lower extremity aching and pain worse at nighttime.  This is causing her to sleep poorly.  Denies injury to her back.  Denies specific low back pain although pain discomfort starts in her hip.  She has tried Celebrex and gabapentin for this which has not been helpful.  Previously notes to have had neck surgery on left side.  She was not sure if the neck had anything to do with the leg.  She is here to review most recent lab work.  While in the emergency room for diverticulitis, she was instructed to follow-up with hematology because her lab work was "off".  Chart review did reveal some mild anemia hemoglobin 11.7 which is new for her and elevation in platelet counts 607 but otherwise unremarkable CBC.  Previously worked up for elevated platelet count with negative JAK2 with  reflex studies.   She denies any episodes of rectal bleeding, bright red blood per rectum, melena or hematochezia.  Her weight is stable.  Reports low energy and appetite is 75%.    REVIEW OF SYSTEMS:  Review of Systems  Constitutional:  Positive for fatigue. Negative for appetite change and fever.  Respiratory:  Negative for cough and shortness of breath.   Gastrointestinal:  Positive for constipation, diarrhea and nausea. Negative for blood in stool, rectal pain and vomiting.  Genitourinary:  Positive for hematuria (at times).   Musculoskeletal:  Positive for arthralgias and back pain (Left hip and down). Negative for gait problem.  Neurological:  Positive for headaches and numbness (Left leg). Negative for gait problem.  Hematological:  Negative for adenopathy.  Psychiatric/Behavioral:  Positive for sleep disturbance.     PAST MEDICAL/SURGICAL HISTORY:  Past Medical History:  Diagnosis Date   Allergies    Anemia    Anxiety    Arthritis    Atypical mole 11/03/2006   mid upper back (slight to moderate)   Atypical mole 11/03/2006   lower right back (slight to moderate)   Back pain    Bipolar 1 disorder (HCC)    Borderline diabetic    Chronic constipation    Depression    Diabetes (HCC)    Diverticulitis    Family history of adverse reaction to anesthesia    mother-- ponv   Fatty liver    GAD (generalized anxiety disorder)    GERD (gastroesophageal reflux disease)    Hiatal hernia  High triglycerides    History of kidney stones    History of recurrent UTIs    History of sepsis 06/2018   History of stomach ulcers    History of suicidal ideation    Hyperlipidemia    Hypothyroidism    IDA (iron deficiency anemia)    Joint pain    Melanoma (HCC) 11/03/2006   left chest in situ (excision)   Orthostasis    Palpitations    PONV (postoperative nausea and vomiting)    Rapid heart rate    Renal calculus, left    Seasonal allergies    Shortness of breath    Sleep  disorder, unspecified    per excessive sleeping during the day   Snoring    Suicide ideation    Swallowing difficulty    Tachycardia    Vertigo    Vitamin D deficiency    Weakness    Wears contact lenses    Past Surgical History:  Procedure Laterality Date   ANTERIOR CERVICAL DECOMP/DISCECTOMY FUSION  02/17/2012   Procedure: ANTERIOR CERVICAL DECOMPRESSION/DISCECTOMY FUSION 2 LEVELS;  Surgeon: Hewitt Shorts, MD;  Location: [facility];  Service: Neurosurgery;  Laterality: N/A;  Cervical four-five and Cervical six-seven anterior cervial decompression with fusion plating and bonegraft   ANTERIOR CERVICAL DECOMP/DISCECTOMY FUSION  04-01-2001   @[facility]    C5---6   BIOPSY  07/20/2018   Procedure: BIOPSY;  Surgeon: Corbin Ade, MD;  Location: [facility];  Service: Endoscopy;;  gastric    BREAST REDUCTION SURGERY Bilateral 1995   CESAREAN SECTION  x2   last one 1998   with BILATERAL TUBAL LIGATION   CESAREAN SECTION WITH BILATERAL TUBAL LIGATION     COLONOSCOPY WITH PROPOFOL N/A 07/20/2018   Dr. Jena Gauss: Diverticulosis, internal hemorrhoids, 5 mm splenic flexure tubular adenoma removed. Repeat in 5 years due to family history.   COLONOSCOPY WITH PROPOFOL N/A 12/07/2020   Procedure: COLONOSCOPY WITH PROPOFOL;  Surgeon: Corbin Ade, MD;  Location: [facility];  Service: Endoscopy;  Laterality: N/A;  2:30pm   CYSTOSCOPY/URETEROSCOPY/HOLMIUM LASER/STENT PLACEMENT Left 08/21/2018   Procedure: CYSTOSCOPY LEFT RETROGRADE PYELOGRAM /URETEROSCOPY/HOLMIUM LASER/STENT PLACEMENT;  Surgeon: Rene Paci, MD;  Location: [facility];  Service: Urology;  Laterality: Left;   DILATION AND CURETTAGE OF UTERUS  1992   for miscarriage   ENDOMETRIAL ABLATION  2014   ESOPHAGOGASTRODUODENOSCOPY     20 years ago.    ESOPHAGOGASTRODUODENOSCOPY (EGD) WITH PROPOFOL N/A 07/20/2018   Dr. Jena Gauss: Small hiatal hernia, erythematous mucosa in the stomach with a healing gastric ulcer  measuring 1 cm, biopsies negative.   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   POLYPECTOMY  07/20/2018   Procedure: POLYPECTOMY;  Surgeon: Corbin Ade, MD;  [facility]: [facility];  Service: Endoscopy;;   POSTERIOR CERVICAL FUSION/FORAMINOTOMY N/A 08/18/2013   Procedure: CERVICAL FOUR TO CERVICAL SEVEN POSTERIOR CERVICAL FUSION/FORAMINOTOMY LEVEL 3;  Surgeon: Hewitt Shorts, MD;  Location: [facility];  Service: Neurosurgery;  Laterality: N/A;  C4-7 posterior cervical fusion with lateral mass fixation   RIGHT/LEFT  HEART CATH AND CORONARY ANGIOGRAPHY N/A 12/23/2019   Procedure: RIGHT/LEFT HEART CATH AND CORONARY ANGIOGRAPHY;  Surgeon: Kathleene Hazel, MD;  Location: [facility];  Service: Cardiovascular;  Laterality: N/A;     SOCIAL HISTORY:  Social History   Socioeconomic History   Marital status: Married    Spouse name: Not on file   Number of children: 2   Years of education: Not on file   Highest education level: Not on file  Occupational History   Occupation: disability  Tobacco Use   Smoking status: Former    Current packs/day: 0.00    Average packs/day: 1 pack/day for 5.0 years (5.0 ttl pk-yrs)    Types: Cigarettes    Start date: 02/09/1997    Quit date: 02/09/2002    Years since quitting: 20.9    Passive exposure: Past   Smokeless tobacco: Never  Vaping Use   Vaping status: Never Used  Substance and Sexual Activity   Alcohol use: No   Drug use: No   Sexual activity: Yes    Partners: Male    Birth control/protection:  Surgical    Comment: spouse  Other Topics Concern   Not on file  Social History Narrative   Step brother-2   Step sister-2   Half sister -1 healthy   Social Drivers of Health   Financial Resource Strain: Low Risk  (09/21/2022)   Received from [facility]   Overall Financial Resource Strain (CARDIA)    Difficulty of Paying Living Expenses: Not hard at all  Food Insecurity: No Food Insecurity (09/21/2022)   Received from [facility]   Hunger Vital Sign    Worried About Running Out of Food in the Last Year: Never true    Ran Out of Food in the Last Year: Never true  Transportation Needs: No Transportation Needs (09/21/2022)   [facility]   PRAPARE - Transportation    Lack of Transportation (Medical): No    Lack of Transportation (Non-Medical): No  Physical Activity: Sufficiently Active (09/21/2022)   Received from [facility]   Exercise Vital Sign    Days of Exercise per Week: 5 days    Minutes of Exercise per Session: 30 min  Stress: No Stress Concern Present (09/21/2022)   Received from [facility] - Occupational Stress Questionnaire    Feeling of Stress : Not at all  Social Connections: Socially Integrated (09/21/2022)   Received from [facility]   Social Connection and Isolation Panel [NHANES]    Frequency of Communication with Friends and Family: More than three times a week    Frequency of Social Gatherings with Friends and Family: Three times a week    Attends Religious Services: More than 4 times per year    Active Member of Clubs or Organizations: Yes    Attends Banker Meetings: 1 to 4 times per year    Marital Status: Married  Catering manager Violence: Not At Risk (09/21/2022)   Received from [facility]   Humiliation, Afraid, Rape, and Kick questionnaire    Fear of Current or Ex-Partner: No    Emotionally Abused: No    Physically Abused: No    Sexually Abused: No     FAMILY HISTORY:  Family History  Problem Relation Age of Onset   Asthma Mother    Rheum arthritis Mother    Non-Hodgkin's lymphoma Mother    Congestive Heart  Failure Mother    Atrial fibrillation Mother    Diabetes Mother    Hyperlipidemia Mother    Cancer Mother    Colon cancer Father    Multiple myeloma Father    Hypertension Father    Cancer Father    Depression Father    Anxiety disorder Father    Alcoholism Father    Obesity Father    Diabetes Maternal Grandmother    Congestive Heart Failure Maternal Grandmother    Mental illness Paternal Grandmother    Colon cancer Paternal Grandfather    Bone cancer Paternal Grandfather    Allergies Other        entire family(mom and dad)   Liver disease Neg Hx     CURRENT MEDICATIONS:  Outpatient Encounter Medications as of 02/03/2023  Medication Sig   acetaminophen (TYLENOL) 500 MG tablet Take 1,000 mg by mouth every 6 (six) hours as needed for moderate pain.   ALPRAZolam (XANAX) 1 MG tablet Take 1 tablet (1 mg total) by mouth 4 (four) times daily as needed for anxiety.   aspirin EC 81 MG tablet Take 1 tablet (81 mg total) by mouth daily. Swallow whole.   buPROPion (WELLBUTRIN XL) 150 MG 24 hr tablet Take 3 tablets (450 mg total) by mouth daily.   busPIRone (BUSPAR) 15 MG tablet Take 2 tablets by mouth twice daily   cetirizine (ZYRTEC) 10 MG tablet Take 10 mg by mouth daily.   cholecalciferol (VITAMIN D3) 25 MCG (1000 UNIT) tablet Take 2,000 Units by mouth every other day.   ciprofloxacin (CIPRO) 500 MG tablet Take 1 tablet (500 mg total) by mouth 2 (two) times daily.   cyanocobalamin (VITAMIN B12) 1000 MCG/ML injection Inject 1 mL (1,000 mcg total) into the muscle once a week. 1 ml weekly X 4 and monthly X 6   fenofibrate (TRICOR) 145 MG tablet Take 1 tablet (145 mg total) by mouth daily.   fluticasone (FLONASE) 50 MCG/ACT nasal spray Place 1 spray into both nostrils daily.   hydrOXYzine (ATARAX) 50 MG tablet TAKE 1 TABLET  BY MOUTH EVERY 6 HOURS AS NEEDED   lamoTRIgine (LAMICTAL) 200 MG tablet TAKE 2 & 1/2 (TWO & ONE-HALF) TABLETS BY MOUTH ONCE DAILY   levothyroxine (SYNTHROID) 75 MCG tablet Take 75 mcg by mouth daily.   loperamide (IMODIUM A-D) 2 MG tablet Take 1 tablet (2 mg total) by mouth 4 (four) times daily as needed for diarrhea or loose stools.   meclizine (ANTIVERT) 25 MG tablet Take 25 mg by mouth 3 (three) times daily as needed for dizziness.   metFORMIN (GLUCOPHAGE-XR) 750 MG 24 hr tablet Take 750 mg by mouth daily.   metroNIDAZOLE (FLAGYL) 500 MG tablet Take 1 tablet (500 mg total) by mouth 2 (two) times daily for 7 days.   ondansetron (ZOFRAN) 8 MG tablet Take 1 tablet (8 mg total) by mouth every 8 (eight) hours as needed for nausea or vomiting.   ondansetron (ZOFRAN-ODT) 8 MG disintegrating tablet Take 1 tablet (8 mg total) by mouth 2 (two) times daily as needed for nausea or vomiting.   Oxcarbazepine (TRILEPTAL) 300 MG tablet Take 3 tablets (900 mg total) by mouth at bedtime.   OZEMPIC, 1 MG/DOSE, 4 MG/3ML SOPN INJECT 1 MG INTO THE SKIN ONCE A WEEK   pantoprazole (PROTONIX) 40 MG tablet TAKE 1 TABLET BY MOUTH TWICE DAILY WITH A MEAL (Patient taking differently: Take 40 mg by mouth daily.)   rosuvastatin (CRESTOR) 5 MG tablet Take 5 mg by  mouth daily.   sertraline (ZOLOFT) 100 MG tablet TAKE 3 TABLETS BY MOUTH AT BEDTIME   Tenapanor HCl (IBSRELA) 50 MG TABS Take 1 tablet by mouth 2 (two) times daily.   valACYclovir (VALTREX) 500 MG tablet Take 500 mg by mouth daily.   No facility-administered encounter medications on file as of 02/03/2023.    ALLERGIES:  Allergies  Allergen Reactions   Iohexol Hives     Desc: IVP DYE Also developed hives when drinking this contrast responded to benadryl.  Desc: IVP DYE  Desc: IVP DYE Also developed hives when drinking this contrast responded to benadryl.   Rosuvastatin Other (See Comments)    Muscle aches with 10mg  and 20mg , tolerates 5mg  daily without  issues Muscle aches with 20mg   Muscle aches with 10mg  and 20mg , tolerates 5mg  daily without issues    Diatrizoate Other (See Comments)     PHYSICAL EXAM:  ECOG PERFORMANCE STATUS: 1 - Symptomatic but completely ambulatory  There were no vitals filed for this visit. There were no vitals filed for this visit. Physical Exam Constitutional:      Appearance: Normal appearance.  Cardiovascular:     Rate and Rhythm: Normal rate and regular rhythm.  Pulmonary:     Effort: Pulmonary effort is normal.     Breath sounds: Normal breath sounds.  Abdominal:     General: Bowel sounds are normal.     Palpations: Abdomen is soft.  Musculoskeletal:        General: No swelling. Normal range of motion.  Neurological:     Mental Status: She is alert and oriented to person, place, and time. Mental status is at baseline.      LABORATORY DATA:  I have reviewed the labs as listed.  CBC    Component Value Date/Time   WBC 8.5 01/30/2023 0907   RBC 4.08 01/30/2023 0907   HGB 11.8 (L) 01/30/2023 0907   HGB 12.0 06/06/2022 1029   HGB 12.9 12/17/2019 1052   HCT 36.7 01/30/2023 0907   HCT 39.0 12/17/2019 1052   PLT 586 (H) 01/30/2023 0907   PLT 445 (H) 06/06/2022 1029   PLT 425 12/17/2019 1052   MCV 90.0 01/30/2023 0907   MCV 86 12/17/2019 1052   MCH 28.9 01/30/2023 0907   MCHC 32.2 01/30/2023 0907   RDW 13.8 01/30/2023 0907   RDW 13.5 12/17/2019 1052   LYMPHSABS 2.9 01/30/2023 0907   LYMPHSABS 3.0 12/17/2019 1052   MONOABS 0.8 01/30/2023 0907   EOSABS 0.2 01/30/2023 0907   EOSABS 0.8 (H) 12/17/2019 1052   BASOSABS 0.1 01/30/2023 0907   BASOSABS 0.1 12/17/2019 1052      Latest Ref Rng & Units 01/30/2023    9:07 AM 11/22/2022   10:43 AM 10/18/2022   10:21 AM  CMP  Glucose 70 - 99 mg/dL 78  78  87   BUN 6 - 20 mg/dL 25  24  23    Creatinine 0.44 - 1.00 mg/dL 4.40  3.47  4.25   Sodium 135 - 145 mmol/L 139  138  140   Potassium 3.5 - 5.1 mmol/L 4.0  3.9  3.9   Chloride 98 - 111  mmol/L 104  104  104   CO2 22 - 32 mmol/L 24  26  25    Calcium 8.9 - 10.3 mg/dL 95.6  9.8  38.7   Total Protein 6.5 - 8.1 g/dL 7.5  7.8  7.6   Total Bilirubin <1.2 mg/dL 0.4  0.5  0.6  Alkaline Phos 38 - 126 U/L 60  70  67   AST 15 - 41 U/L 20  20  20    ALT 0 - 44 U/L 16  17  18      DIAGNOSTIC IMAGING:  I have independently reviewed the relevant imaging and discussed with the patient.  ASSESSMENT & PLAN: 1.  Neutrophilic leukocytosis and thrombocytosis - JAK2 CALR, and MPL testing was negative; negative BCR/ABL FISH testing; normal LDH, negative SPEP testing.  - History of intermittent neutrophilic leukocytosis since 2009, with WBC up to to 20.5 -Repeat JAK2 CALR and MPL testing was negative on 06/06/2022. - Ultrasound of the abdomen on 05/27/2019 showed probable fatty infiltration of the liver with minimal splenic enlargement measuring 12.1 cm in length with volume calculated to 502 mL.  - She continues to have diverticulitis flares most recently treated in November at [facility] ED. -She is followed by gastroenterology and her last colonoscopy was in October of 2022 which showed diverticulosis of the entire colon without evidence of diverticulitis.  No other abnormality. -She received 1 g IV iron (INFeD) on 10/29/2022 with good tolerance. -Has intermittent headaches and dizziness but otherwise no hyperviscosity symptoms.  Reports this is chronic for her. -No additional IV iron needed at this time. 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 -We discussed at length potentially having a bone marrow biopsy should counts continue to drop and significant family history for blood cancers despite adequate B12 and iron levels.  She would like to hold off at this time and monitor and we will recheck her labs in 3 months.  2.  Family history: -Father had myeloma and paternal grandfather also had myeloma.  Mother had non-Hodgkin's  lymphoma.  Maternal uncle had non-Hodgkin's lymphoma.  Paternal aunt had colon cancer.  3.  Elevated B12 and folate levels: - She received 1 B12 injection in clinic on 11/29/2022 and self administers B12 monthly at home. -Repeat vitamin B12 levels are 633 (1106) and folate was greater than 40. -Will recheck B12 levels 6 months from previous.  4.  Bilateral lower extremity pain/cramping: - Previously reported bilateral lower extremity numbness and cramping.  Right side appears to have improved.  Continues to have left hip down to her knee pain worse at bedtime.  Has not been seen by vein or vascular.  There is no swelling or erythema.  We discussed it could be secondary to an impingement stemming from her back or her hip.  Could try prednisone 40 mg each morning with breakfast for the next 5 days to see if any improvement is noted.  If not we discussed a muscle relaxer such as Flexeril at bedtime.  New prescriptions called into her pharmacy in [place].  -We discussed imaging of her low back and hip should symptoms not improve. -We discussed supplemental ice and heat to low back and hip and may rotate Tylenol and ibuprofen as needed.  5.  Neck and back pain: -Follow-up with pain clinic as needed-stable  6.  Dizziness: -Continue meclizine and Zofran for nausea-improving  7.  Anxiety: -She is followed by psychiatry. -Continue Xanax 1 mg, 1 po qid prn.  -Continue Wellbutrin XL 150 mg, 3 p.o. every morning. -Continue Buspar 15 mg, to 2 po twice daily. -Continue hydroxyzine to 50 mg, 1 p.o. every 6 hours as needed anxiety.   -Continue Lamictal 200 mg, 2.5 pills daily. -Continue Trileptal 300 mg, 3 nightly. -Continue Zoloft 100 mg, 3 p.o. daily. -  Continue counseling with Vick Frees.     PLAN SUMMARY & DISPOSITION: PLAN SUMMARY: >> No additional IV iron needed at this time >> Continue B12 injections at home monthly.  I am >> Recommend starting prednisone 40 mg each morning with breakfast x  5 days if this helps.  If no improvement, recommend Flexeril 10 mg at bedtime. >> We discussed lower back and hip imaging should symptoms not get better.     All questions were answered. The patient knows to call the clinic with any problems, questions or concerns.  Medical decision making: Moderate  Time spent on visit: I spent 25 minutes counseling the patient face to face. The total time spent in the appointment was 20 minutes and more than 50% was on counseling.  Durenda Hurt, NP 02/03/2023 6:21 AM

## 2023-02-06 ENCOUNTER — Other Ambulatory Visit: Payer: Self-pay | Admitting: Physician Assistant

## 2023-02-07 ENCOUNTER — Other Ambulatory Visit: Payer: Self-pay | Admitting: Physician Assistant

## 2023-02-17 ENCOUNTER — Encounter: Payer: Self-pay | Admitting: Physician Assistant

## 2023-02-17 ENCOUNTER — Telehealth (INDEPENDENT_AMBULATORY_CARE_PROVIDER_SITE_OTHER): Payer: 59 | Admitting: Physician Assistant

## 2023-02-17 DIAGNOSIS — F5105 Insomnia due to other mental disorder: Secondary | ICD-10-CM

## 2023-02-17 DIAGNOSIS — F319 Bipolar disorder, unspecified: Secondary | ICD-10-CM

## 2023-02-17 DIAGNOSIS — F411 Generalized anxiety disorder: Secondary | ICD-10-CM

## 2023-02-17 DIAGNOSIS — R5381 Other malaise: Secondary | ICD-10-CM

## 2023-02-17 DIAGNOSIS — Z6379 Other stressful life events affecting family and household: Secondary | ICD-10-CM

## 2023-02-17 DIAGNOSIS — G2581 Restless legs syndrome: Secondary | ICD-10-CM

## 2023-02-17 DIAGNOSIS — F99 Mental disorder, not otherwise specified: Secondary | ICD-10-CM

## 2023-02-17 DIAGNOSIS — R5383 Other fatigue: Secondary | ICD-10-CM

## 2023-02-17 MED ORDER — AUVELITY 45-105 MG PO TBCR: 1.0000 | EXTENDED_RELEASE_TABLET | Freq: Two times a day (BID) | ORAL | 1 refills | Status: DC

## 2023-02-17 NOTE — Progress Notes (Signed)
 [facility]  Patient ID: Kristen [name],  MRN: [id number]  PCP: Nena Rosina LITTIE DEVONNA  Date of Evaluation: 02/17/2023 Time spent:25 minutes  Chief Complaint:  Chief Complaint   Anxiety; Depression; Insomnia; Follow-up    Virtual Visit via Telehealth  I connected with patient by a video enabled telemedicine application with their informed consent, and verified patient privacy and that I am speaking with the correct person using two identifiers.  I am private, in my office and the patient is at home.  I discussed the limitations, risks, security and privacy concerns of performing an evaluation and management service by video and the availability of in person appointments. I also discussed with the patient that there may be a patient responsible charge related to this service. The patient expressed understanding and agreed to proceed.   I discussed the assessment and treatment plan with the patient. The patient was provided an opportunity to ask questions and all were answered. The patient agreed with the plan and demonstrated an understanding of the instructions.   The patient was advised to call back or seek an in-person evaluation if the symptoms worsen or if the condition fails to improve as anticipated.  I provided 25 minutes of non-face-to-face time during this encounter.  HISTORY/CURRENT STATUS: HPI For routine med check.  Not doing well. Very depressed. Anhedonia. States she doesn't sleep at all.  Then says she sleeps about 4 hours per night, but naps anytime she is off work. Is numb, hasn't been in therapy in about 4 months, lost EAP. Her husband has absence seizures, now on disability. [employer] 'back door retired him and he's mad at the world.' No physical or verbal abuse.  Working at [employer] now 5 days per week for several hours. It does get her out of the house, but she doesn't want to go.  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  Cries easily.  ADLs and personal  hygiene are normal.   Denies any changes in concentration, making decisions, or remembering things.  Appetite has not changed.  Anxiety is controlled most of the time. Xanax  helps when she needs it.  It is effective.  Denies suicidal or homicidal thoughts.  States she had has been tempted to spend more money, thinking that will make her feel less depressed.  But she talks herself down before she does spend.  Patient denies increased energy with decreased need for sleep, increased talkativeness, racing thoughts, impulsivity or risky behaviors, increased libido, grandiosity, increased irritability or anger, paranoia, or hallucinations.  Her hair is thinning.  Denies dizziness, syncope, seizures, numbness, tingling, tremor, tics, unsteady gait, slurred speech, confusion. Denies muscle or joint pain, stiffness, or dystonia. Denies unexplained weight loss, frequent infections, or sores that heal slowly.  No polyphagia, polydipsia, or polyuria. Denies visual changes or paresthesias.   Individual Medical History/ Review of Systems: Changes? :Yes   had diverticulitis again, tx outpt, has seen a surgeon and will see a colorectal specialist in Feb.  Saw her hematologist, had iron  infusion.  Past medications for mental health diagnoses include: Prozac , Paxil, Lexapro, Celexa, Zoloft , Viibryd , Effexor, Cymbalta , Risperdal , Latuda , Lamictal , VPA, Lithium -became toxic, Xanax , Wellbutrin , Abilify , Traz, Strattera, Kapvay, Saphris, Adderall, Vyvanse , Lunesta, Sonata ,  Vraylar, Seroquel , Geodon, Tegretal  Allergies: Iohexol , Rosuvastatin , and Diatrizoate  Current Medications:  Current Outpatient Medications:    acetaminophen  (TYLENOL ) 500 MG tablet, Take 1,000 mg by mouth every 6 (six) hours as needed for moderate pain., Disp: , Rfl:  ALPRAZolam  (XANAX ) 1 MG tablet, Take 1 tablet (1 mg total) by mouth 4 (four) times daily as needed for anxiety., Disp: 120 tablet, Rfl: 5   buPROPion  (WELLBUTRIN  XL) 150 MG 24 hr  tablet, Take 3 tablets by mouth once daily, Disp: 90 tablet, Rfl: 0   busPIRone  (BUSPAR ) 15 MG tablet, Take 2 tablets by mouth twice daily, Disp: 120 tablet, Rfl: 1   cetirizine  (ZYRTEC ) 10 MG tablet, Take 10 mg by mouth daily., Disp: , Rfl:    cyanocobalamin  (VITAMIN B12) 1000 MCG/ML injection, Inject 1 mL (1,000 mcg total) into the muscle once a week. 1 ml weekly X 4 and monthly X 6, Disp: 10 mL, Rfl: 0   Dextromethorphan-buPROPion  ER (AUVELITY ) 45-105 MG TBCR, Take 1 tablet by mouth 2 (two) times daily., Disp: 60 tablet, Rfl: 1   fenofibrate  (TRICOR ) 145 MG tablet, Take 1 tablet (145 mg total) by mouth daily., Disp: 90 tablet, Rfl: 3   fluticasone  (FLONASE ) 50 MCG/ACT nasal spray, Place 1 spray into both nostrils daily., Disp: , Rfl:    hydrOXYzine  (ATARAX ) 50 MG tablet, TAKE 1 TABLET BY MOUTH EVERY 6 HOURS AS NEEDED, Disp: 120 tablet, Rfl: 1   lamoTRIgine  (LAMICTAL ) 200 MG tablet, TAKE 2 & 1/2 (TWO & ONE-HALF) TABLETS BY MOUTH ONCE DAILY, Disp: 225 tablet, Rfl: 0   levothyroxine  (SYNTHROID ) 75 MCG tablet, Take 75 mcg by mouth daily., Disp: , Rfl:    meclizine  (ANTIVERT ) 25 MG tablet, Take 25 mg by mouth 3 (three) times daily as needed for dizziness., Disp: , Rfl:    metFORMIN  (GLUCOPHAGE -XR) 750 MG 24 hr tablet, Take 750 mg by mouth daily., Disp: , Rfl:    ondansetron  (ZOFRAN ) 8 MG tablet, Take 1 tablet (8 mg total) by mouth every 8 (eight) hours as needed for nausea or vomiting., Disp: 90 tablet, Rfl: 2   ondansetron  (ZOFRAN -ODT) 8 MG disintegrating tablet, Take 1 tablet (8 mg total) by mouth 2 (two) times daily as needed for nausea or vomiting., Disp: 60 tablet, Rfl: 5   Oxcarbazepine  (TRILEPTAL ) 300 MG tablet, Take 3 tablets (900 mg total) by mouth at bedtime., Disp: 270 tablet, Rfl: 3   OZEMPIC , 1 MG/DOSE, 4 MG/3ML SOPN, INJECT 1 MG SUBCUTANEOUSLY ONCE A WEEK, Disp: 3 mL, Rfl: 0   pantoprazole  (PROTONIX ) 40 MG tablet, TAKE 1 TABLET BY MOUTH TWICE DAILY WITH A MEAL (Patient taking differently:  Take 40 mg by mouth daily.), Disp: 180 tablet, Rfl: 2   rosuvastatin  (CRESTOR ) 5 MG tablet, Take 5 mg by mouth daily., Disp: , Rfl:    sertraline  (ZOLOFT ) 100 MG tablet, TAKE 3 TABLETS BY MOUTH AT BEDTIME, Disp: 90 tablet, Rfl: 0   aspirin  EC 81 MG tablet, Take 1 tablet (81 mg total) by mouth daily. Swallow whole. (Patient not taking: Reported on 02/17/2023), Disp: 30 tablet, Rfl: 11   cholecalciferol (VITAMIN D3) 25 MCG (1000 UNIT) tablet, Take 2,000 Units by mouth every other day. (Patient not taking: Reported on 02/17/2023), Disp: , Rfl:    cyclobenzaprine  (FLEXERIL ) 10 MG tablet, Take 1 tablet (10 mg total) by mouth 3 (three) times daily as needed for muscle spasms. (Patient not taking: Reported on 02/17/2023), Disp: 30 tablet, Rfl: 0   loperamide  (IMODIUM  A-D) 2 MG tablet, Take 1 tablet (2 mg total) by mouth 4 (four) times daily as needed for diarrhea or loose stools. (Patient not taking: Reported on 02/17/2023), Disp: 40 tablet, Rfl: 0   predniSONE  (DELTASONE ) 20 MG tablet, Take 2 tablets (40 mg  total) by mouth daily with breakfast. (Patient not taking: Reported on 02/17/2023), Disp: 10 tablet, Rfl: 0   Tenapanor HCl (IBSRELA ) 50 MG TABS, Take 1 tablet by mouth 2 (two) times daily. (Patient not taking: Reported on 02/17/2023), Disp: 60 tablet, Rfl: 5   valACYclovir (VALTREX) 500 MG tablet, Take 500 mg by mouth daily. (Patient not taking: Reported on 02/17/2023), Disp: , Rfl:  Medication Side Effects:  Sweating  Family Medical/ Social History: Changes? None  MENTAL HEALTH EXAM:  There were no vitals taken for this visit.There is no height or weight on file to calculate BMI.  General Appearance: Casual and Well Groomed  Eye Contact:  Good  Speech:  Clear and Coherent and Normal Rate  Volume:  Normal  Mood:  Euthymic  Affect:  Congruent  Thought Process:  Goal Directed and Descriptions of Associations: Circumstantial  Orientation:  Full (Time, Place, and Person)  Thought Content: Logical   Suicidal  Thoughts:  No  Homicidal Thoughts:  No  Memory:  WNL  Judgement:  Good  Insight:  Good  Psychomotor Activity:  Normal  Concentration:  Concentration: Good and Attention Span: Good  Recall:  Good  Fund of Knowledge: Good  Language: Good  Assets:  Desire for Improvement  ADL's:  Intact  Cognition: WNL  Prognosis:  Good   Gene site test results are under labs dated 04/03/2021.  DIAGNOSES:    ICD-10-CM   1. Bipolar I disorder with depression (HCC)  F31.9     2. Generalized anxiety disorder  F41.1     3. Insomnia due to other mental disorder  F51.05    F99     4. Malaise and fatigue  R53.81    R53.83     5. Restless legs  G25.81     6. Stress due to illness of family member  Z63.79       Receiving Psychotherapy: No    Rocky Andreas in the past  RECOMMENDATIONS:  PDMP was reviewed.  Last Xanax  filled 02/01/2023.  Gabapentin  filled 10/11/2022. I provided 25 minutes of non-face to face time during this encounter, including time spent before and after the visit in records review, medical decision making, counseling pertinent to today's visit, and charting.   We discussed her symptoms.  I recommend changing Wellbutrin  to Auvelity .  Benefits, risk and side effects were discussed and she would like to try it.  She prefers that the prescription be sent through her regular pharmacy, not [facility].  This visit was virtual so no samples were given.  She knows to stop the Wellbutrin  when she starts the Auvelity .  Approximately 18 minutes spent in counseling concerning healthy lifestyle choices to help with depression, anxiety and sleep.  We also discussed sleep hygiene.  We also discussed the increased desire to spend.  I do not want to change more than 1 thing at a time however if the urge to spend worsens or she starts having other manic symptoms, she should call and we will increase the Trileptal .  She understands.  Continue Xanax  1 mg, 1 po qid prn.  Continue Wellbutrin  XL 150 mg, 3 p.o.  every morning. Continue Buspar  15 mg, to 2 po twice daily. Start Auvelity  45-105 mg, 1 p.o. twice daily.  (Stop Wellbutrin  when she starts this.  See above.) Continue hydroxyzine  to 50 mg, 1 p.o. every 6 hours as needed anxiety.   Continue Lamictal  200 mg, 2.5 pills daily. Continue Trileptal  300 mg, 3 nightly. Continue Zoloft  100 mg,  3 p.o. daily. Continue vitamins as per med list.  Add biotin daily. Get back in to counseling with Rocky Andreas.  Return in 6 to 8 weeks.  Verneita Cooks, PA-C

## 2023-02-20 ENCOUNTER — Encounter: Payer: Self-pay | Admitting: Hematology

## 2023-03-02 ENCOUNTER — Encounter: Payer: Self-pay | Admitting: Hematology

## 2023-03-03 ENCOUNTER — Telehealth: Payer: Self-pay

## 2023-03-03 NOTE — Telephone Encounter (Signed)
Pharmacy Patient Advocate Encounter   Received notification from CoverMyMeds that prior authorization for Ibsrela 50 MG tablet  is required/requested.   Insurance verification completed.   The patient is insured through [facility] .   Per test claim: PA required; PA submitted to above mentioned insurance via CoverMyMeds Key/confirmation #/EOC [id number] Status is pending

## 2023-03-06 ENCOUNTER — Telehealth: Payer: Self-pay | Admitting: Pharmacist

## 2023-03-06 ENCOUNTER — Other Ambulatory Visit: Payer: Self-pay | Admitting: Cardiology

## 2023-03-06 ENCOUNTER — Telehealth: Payer: Self-pay | Admitting: Pharmacy Technician

## 2023-03-06 ENCOUNTER — Other Ambulatory Visit (HOSPITAL_COMMUNITY): Payer: Self-pay

## 2023-03-06 DIAGNOSIS — E119 Type 2 diabetes mellitus without complications: Secondary | ICD-10-CM

## 2023-03-06 NOTE — Telephone Encounter (Signed)
Patient LVM stating her new insurance does not cover Ozempic. I amy require prior authorization. she was told by insurance [employer] is on formulary. However, [employer] weight loss data is not that great so we will apply for PA for Ozempic 1 mg.

## 2023-03-06 NOTE — Telephone Encounter (Signed)
Pharmacy Patient Advocate Encounter   Received notification from Pt Calls Messages that prior authorization for ozempic is required/requested.   Insurance verification completed.   The patient is insured through [facility] .   Per test claim: PA required; PA submitted to above mentioned insurance via CoverMyMeds Key/confirmation #/EOC [id number] Status is pending

## 2023-03-06 NOTE — Telephone Encounter (Signed)
Pharmacy Patient Advocate Encounter  Received notification from [facility] that Prior Authorization for ozempic has been APPROVED from 03/06/23 to 03/05/24   PA #/Case ID/Reference #: [id number]

## 2023-03-07 ENCOUNTER — Other Ambulatory Visit: Payer: Self-pay | Admitting: Cardiology

## 2023-03-07 DIAGNOSIS — E119 Type 2 diabetes mellitus without complications: Secondary | ICD-10-CM

## 2023-03-08 ENCOUNTER — Other Ambulatory Visit (HOSPITAL_BASED_OUTPATIENT_CLINIC_OR_DEPARTMENT_OTHER): Payer: Self-pay

## 2023-03-08 ENCOUNTER — Other Ambulatory Visit: Payer: Self-pay | Admitting: Physician Assistant

## 2023-03-10 ENCOUNTER — Other Ambulatory Visit (HOSPITAL_COMMUNITY): Payer: Self-pay

## 2023-03-10 ENCOUNTER — Other Ambulatory Visit: Payer: Self-pay

## 2023-03-10 ENCOUNTER — Other Ambulatory Visit: Payer: Self-pay | Admitting: Cardiology

## 2023-03-11 ENCOUNTER — Encounter: Payer: Self-pay | Admitting: Pharmacist

## 2023-03-28 ENCOUNTER — Other Ambulatory Visit: Payer: Self-pay | Admitting: Cardiology

## 2023-03-28 ENCOUNTER — Other Ambulatory Visit: Payer: Self-pay | Admitting: Physician Assistant

## 2023-03-31 NOTE — Telephone Encounter (Signed)
Has appt 2/18

## 2023-04-01 ENCOUNTER — Telehealth (INDEPENDENT_AMBULATORY_CARE_PROVIDER_SITE_OTHER): Payer: Medicare Other | Admitting: Physician Assistant

## 2023-04-01 ENCOUNTER — Encounter: Payer: Self-pay | Admitting: Physician Assistant

## 2023-04-01 DIAGNOSIS — F319 Bipolar disorder, unspecified: Secondary | ICD-10-CM | POA: Diagnosis not present

## 2023-04-01 DIAGNOSIS — G4733 Obstructive sleep apnea (adult) (pediatric): Secondary | ICD-10-CM

## 2023-04-01 DIAGNOSIS — F5105 Insomnia due to other mental disorder: Secondary | ICD-10-CM

## 2023-04-01 DIAGNOSIS — F99 Mental disorder, not otherwise specified: Secondary | ICD-10-CM

## 2023-04-01 DIAGNOSIS — F411 Generalized anxiety disorder: Secondary | ICD-10-CM | POA: Diagnosis not present

## 2023-04-01 DIAGNOSIS — Z6379 Other stressful life events affecting family and household: Secondary | ICD-10-CM

## 2023-04-01 DIAGNOSIS — G2581 Restless legs syndrome: Secondary | ICD-10-CM

## 2023-04-01 MED ORDER — HYDROXYZINE HCL 50 MG PO TABS
50.0000 mg | ORAL_TABLET | Freq: Four times a day (QID) | ORAL | 5 refills | Status: DC | PRN
Start: 2023-04-01 — End: 2023-09-14

## 2023-04-01 NOTE — Progress Notes (Signed)
[facility]  Patient ID: Kristen [name],  MRN: [id number]  PCP: Exie Parody  Date of Evaluation: 04/01/2023 Time spent:25 minutes  Chief Complaint:  Chief Complaint   Anxiety; Depression; Insomnia; Follow-up    Virtual Visit via Telehealth  I connected with patient by a video enabled telemedicine application with their informed consent, and verified patient privacy and that I am speaking with the correct person using two identifiers.  I am private, in my office and the patient is at home.  I discussed the limitations, risks, security and privacy concerns of performing an evaluation and management service by video and the availability of in person appointments. I also discussed with the patient that there may be a patient responsible charge related to this service. The patient expressed understanding and agreed to proceed.   I discussed the assessment and treatment plan with the patient. The patient was provided an opportunity to ask questions and all were answered. The patient agreed with the plan and demonstrated an understanding of the instructions.   The patient was advised to call back or seek an in-person evaluation if the symptoms worsen or if the condition fails to improve as anticipated.  I provided 25 minutes of non-face-to-face time during this encounter.  HISTORY/CURRENT STATUS: HPI For routine med check.  Under a lot of stress.  Her husband is still very sick, having seizures and unable to work.  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  We changed Wellbutrin to Auvelity at the last visit.  States she is not any better but no worse either so she takes that is a good sign.  She does not enjoy things very much, does not have the money or the energy or motivation to do anything.  "What I have going on is not going to be fixed by a pill."  No extreme sadness, tearfulness, or feelings of hopelessness.  Still has trouble sleeping, cannot get her mind to  turn off so she can go to sleep.  ADLs and personal hygiene are normal.   Denies any changes in concentration, making decisions, or remembering things.  Appetite has not changed.  Weight is stable.  She still gets anxious, takes Xanax 1 mg 4 times daily and it is effective.  She does not have panic attacks but more so a generalized sense of unease, like something bad could happen at any time.  Denies suicidal or homicidal thoughts.  Patient denies increased energy with decreased need for sleep, increased talkativeness, racing thoughts, impulsivity or risky behaviors, increased spending, increased libido, grandiosity, increased irritability or anger, paranoia, or hallucinations.  Review of Systems  Constitutional:  Positive for malaise/fatigue.  HENT: Negative.    Eyes: Negative.   Respiratory: Negative.    Cardiovascular: Negative.   Gastrointestinal: Negative.   Genitourinary: Negative.   Musculoskeletal: Negative.   Skin: Negative.   Neurological: Negative.   Endo/Heme/Allergies: Negative.   Psychiatric/Behavioral:         See HPI    Individual Medical History/ Review of Systems: Changes? :Yes   has seen a colorectal surgeon for the diverticulitis.  They do not think surgery is necessary.   Past medications for mental health diagnoses include: Prozac, Paxil, Lexapro, Celexa, Zoloft, Viibryd, Effexor, Cymbalta, Risperdal, Latuda, Lamictal, VPA, Lithium-became toxic, Xanax, Wellbutrin, Abilify, Thea Silversmith, Strattera, Kapvay, Saphris, Adderall, Vyvanse, Lunesta, Sonata,  Vraylar, Seroquel, Geodon, Tegretal  Allergies: Iohexol, Rosuvastatin, and Diatrizoate  Current Medications:  Current Outpatient Medications:    acetaminophen (  TYLENOL) 500 MG tablet, Take 1,000 mg by mouth every 6 (six) hours as needed for moderate pain., Disp: , Rfl:    ALPRAZolam (XANAX) 1 MG tablet, Take 1 tablet (1 mg total) by mouth 4 (four) times daily as needed for anxiety., Disp: 120 tablet, Rfl: 5   busPIRone  (BUSPAR) 15 MG tablet, Take 2 tablets by mouth twice daily, Disp: 120 tablet, Rfl: 1   cetirizine (ZYRTEC) 10 MG tablet, Take 10 mg by mouth daily., Disp: , Rfl:    cyanocobalamin (VITAMIN B12) 1000 MCG/ML injection, Inject 1 mL (1,000 mcg total) into the muscle once a week. 1 ml weekly X 4 and monthly X 6, Disp: 10 mL, Rfl: 0   Dextromethorphan-buPROPion ER (AUVELITY) 45-105 MG TBCR, Take 1 tablet by mouth 2 (two) times daily., Disp: 60 tablet, Rfl: 1   fenofibrate (TRICOR) 145 MG tablet, Take 1 tablet by mouth once daily, Disp: 15 tablet, Rfl: 0   fluticasone (FLONASE) 50 MCG/ACT nasal spray, Place 1 spray into both nostrils daily., Disp: , Rfl:    lamoTRIgine (LAMICTAL) 200 MG tablet, TAKE 2 & 1/2 (TWO & ONE-HALF) TABLETS BY MOUTH ONCE DAILY, Disp: 225 tablet, Rfl: 0   levothyroxine (SYNTHROID) 75 MCG tablet, Take 75 mcg by mouth daily., Disp: , Rfl:    meclizine (ANTIVERT) 25 MG tablet, Take 25 mg by mouth 3 (three) times daily as needed for dizziness., Disp: , Rfl:    metFORMIN (GLUCOPHAGE-XR) 750 MG 24 hr tablet, Take 750 mg by mouth daily., Disp: , Rfl:    ondansetron (ZOFRAN) 8 MG tablet, Take 1 tablet (8 mg total) by mouth every 8 (eight) hours as needed for nausea or vomiting., Disp: 90 tablet, Rfl: 2   ondansetron (ZOFRAN-ODT) 8 MG disintegrating tablet, Take 1 tablet (8 mg total) by mouth 2 (two) times daily as needed for nausea or vomiting., Disp: 60 tablet, Rfl: 5   Oxcarbazepine (TRILEPTAL) 300 MG tablet, Take 3 tablets (900 mg total) by mouth at bedtime., Disp: 270 tablet, Rfl: 3   rosuvastatin (CRESTOR) 5 MG tablet, Take 5 mg by mouth daily., Disp: , Rfl:    sertraline (ZOLOFT) 100 MG tablet, TAKE 3 TABLETS BY MOUTH AT BEDTIME, Disp: 90 tablet, Rfl: 0   aspirin EC 81 MG tablet, Take 1 tablet (81 mg total) by mouth daily. Swallow whole. (Patient not taking: Reported on 04/01/2023), Disp: 30 tablet, Rfl: 11   cholecalciferol (VITAMIN D3) 25 MCG (1000 UNIT) tablet, Take 2,000 Units by  mouth every other day. (Patient not taking: Reported on 04/01/2023), Disp: , Rfl:    cyclobenzaprine (FLEXERIL) 10 MG tablet, Take 1 tablet (10 mg total) by mouth 3 (three) times daily as needed for muscle spasms. (Patient not taking: Reported on 04/01/2023), Disp: 30 tablet, Rfl: 0   hydrOXYzine (ATARAX) 50 MG tablet, Take 1 tablet (50 mg total) by mouth every 6 (six) hours as needed., Disp: 120 tablet, Rfl: 5   loperamide (IMODIUM A-D) 2 MG tablet, Take 1 tablet (2 mg total) by mouth 4 (four) times daily as needed for diarrhea or loose stools. (Patient not taking: Reported on 04/01/2023), Disp: 40 tablet, Rfl: 0   OZEMPIC, 1 MG/DOSE, 4 MG/3ML SOPN, INJECT 1 MG SUBCUTANEOUSLY ONCE A WEEK (Patient not taking: Reported on 04/01/2023), Disp: 3 mL, Rfl: 0   pantoprazole (PROTONIX) 40 MG tablet, TAKE 1 TABLET BY MOUTH TWICE DAILY WITH A MEAL (Patient not taking: Reported on 04/01/2023), Disp: 180 tablet, Rfl: 2   predniSONE (DELTASONE) 20  MG tablet, Take 2 tablets (40 mg total) by mouth daily with breakfast. (Patient not taking: Reported on 04/01/2023), Disp: 10 tablet, Rfl: 0   Tenapanor HCl (IBSRELA) 50 MG TABS, Take 1 tablet by mouth 2 (two) times daily. (Patient not taking: Reported on 04/01/2023), Disp: 60 tablet, Rfl: 5   valACYclovir (VALTREX) 500 MG tablet, Take 500 mg by mouth daily. (Patient not taking: Reported on 04/01/2023), Disp: , Rfl:  Medication Side Effects:  Sweating  Family Medical/ Social History: Changes?  Continued stress with their son, he is in the [employer] and he and wife have split up, he has signed sole custody over to her.  MENTAL HEALTH EXAM:  There were no vitals taken for this visit.There is no height or weight on file to calculate BMI.  General Appearance: Casual and Well Groomed  Eye Contact:  Good  Speech:  Clear and Coherent and Normal Rate  Volume:  Normal  Mood:  Euthymic  Affect:  Congruent  Thought Process:  Goal Directed and Descriptions of Associations:  Circumstantial  Orientation:  Full (Time, Place, and Person)  Thought Content: Logical   Suicidal Thoughts:  No  Homicidal Thoughts:  No  Memory:  WNL  Judgement:  Good  Insight:  Good  Psychomotor Activity:  Normal  Concentration:  Concentration: Good and Attention Span: Good  Recall:  Good  Fund of Knowledge: Good  Language: Good  Assets:  Desire for Improvement Financial Resources/Insurance Housing Transportation  ADL's:  Intact  Cognition: WNL  Prognosis:  Good   Gene site test results are under labs dated 04/03/2021.  DIAGNOSES:    ICD-10-CM   1. Bipolar I disorder (HCC)  F31.9     2. Generalized anxiety disorder  F41.1     3. Insomnia due to other mental disorder  F51.05    F99     4. Obstructive sleep apnea  G47.33     5. Restless legs  G25.81     6. Stress due to illness of family member  Z63.79      Receiving Psychotherapy: Yes    Vick Frees   RECOMMENDATIONS:  PDMP was reviewed.  Last Xanax filled 03/06/2023. I provided  25 minutes of non-face-to-face time during this encounter, including time spent before and after the visit in records review, medical decision making, counseling pertinent to today's visit, and charting.   We discussed her situation.  I agree that it is circumstantial at least in part and a medication change will not make a difference.  Continue Xanax 1 mg, 1 po qid prn.  Continue Buspar 15 mg, to 2 po twice daily. Continue Auvelity 45-105 mg, 1 p.o. twice daily.   Continue hydroxyzine to 50 mg, 1 p.o. every 6 hours as needed anxiety.   Continue Lamictal 200 mg, 2.5 pills daily. Continue Trileptal 300 mg, 3 nightly. Continue Zoloft 100 mg, 3 p.o. daily. Continue vitamins as per med list.  Add biotin daily. Continue counseling with Vick Frees.  Return in 2 months.  Melony Overly, PA-C

## 2023-04-03 ENCOUNTER — Other Ambulatory Visit: Payer: Self-pay | Admitting: Physician Assistant

## 2023-04-04 ENCOUNTER — Other Ambulatory Visit: Payer: Self-pay | Admitting: Physician Assistant

## 2023-04-08 ENCOUNTER — Other Ambulatory Visit: Payer: Self-pay | Admitting: Physician Assistant

## 2023-04-17 ENCOUNTER — Telehealth: Payer: Self-pay | Admitting: Cardiology

## 2023-04-17 NOTE — Telephone Encounter (Signed)
 Caller Alycia Rossetti) stated he is following-up on fax sent regarding getting patient's Ozempic medication.  Caller wants call back to confirm fax received.

## 2023-04-20 ENCOUNTER — Other Ambulatory Visit: Payer: Self-pay | Admitting: Physician Assistant

## 2023-04-21 ENCOUNTER — Telehealth: Payer: Self-pay | Admitting: Pharmacy Technician

## 2023-04-21 ENCOUNTER — Encounter: Payer: Self-pay | Admitting: Pharmacy Technician

## 2023-04-21 ENCOUNTER — Encounter: Payer: Self-pay | Admitting: Hematology

## 2023-04-21 ENCOUNTER — Other Ambulatory Visit (HOSPITAL_COMMUNITY): Payer: Self-pay

## 2023-04-21 NOTE — Telephone Encounter (Signed)
 Let pt know:We got a fax from rx assistance saying to fill out the patient assistance form and send back to then. We so patient assistance for our patients but you have a Production assistant, radio. Even though you are under [employer], its still coming up as commerical. You have to have a medicare insurance to get patient assistance. Ozempic has a coupon which I see you talked about with Efraim Kaufmann but just letting you know the only option is coupon.

## 2023-04-21 NOTE — Telephone Encounter (Signed)
 PAP: Patient assistance application for Ozempic through [employer] has been mailed to pt's home address on file. Provider portion of application will be faxed to provider's office.     Me to [employer]      04/21/23  2:28 PM Hi! We are not permitted to send any information to any third party service, we can only send directly to the patient assistance. I can mail you the patient assistance forms and you can complete your portion and then we can complete the provider portion. This would still go to [facility], who the rx assistance was going to send it to. To noted:  To be eligible for this program (novo care), you must: Be a [place] citizen or legal resident Have a total household income that is at or below 400% of the federal poverty level (FPL). Visit the [facility] website, which lists the current FPL guidelines Have Medicare or no insurance (Note: If you have private or [employer], you are not eligible for the PAP) Not be enrolled in or qualify for any other federal, state, or government program such as Medicaid, Low Income Subsidy, or Aetna ([place]) Benefits If you are eligible for Medicaid, you must sign the Patient Declaration section of the latest version of the PAP application stating that you are not enrolled in, plan to enroll in, or are eligible for Medicaid or Medicare Extra Help/LIS (proof of denial must be submitted if requested)   I will mail you the form, once received please fill out and send [place] back the form along with all the required documents. You can drop them off at the office or mail them back in to the office.     Regards,  Willette Cluster, CPhT-Adv  Pharmacy Patient Advocate Specialist  Direct Number: [phone] Fax: [phone]    This MyChart message has not been read.      04/21/23 11:56 AM Bourn, Ruffin Frederick, RN routed this conversation to Me [name] to P [facility] Triage (supporting You)      04/21/23 10:27 AM They are  fully aware of me having Medicare part B & that Ozempic is not covered considering it's a tier 2 medication.  Based on my monthly income they stated I could be covered under the program.  I'm in the process of completing my part.  May you please just fill out the requested info.  I would appreciate it so much.  Please & thank you. Me to [employer]      04/21/23 10:05 AM Hi! We got a fax from rx assistance saying to fill out the patient assistance form and send back to then. We so patient assistance for our patients but you have a Production assistant, radio. Even though you are under [employer], its still coming up as commerical. You have to have a medicare insurance to get patient assistance. Ozempic has a coupon which I see you talked about with Efraim Kaufmann but just letting you know the only option is coupon. Thank you!    Regards,  Willette Cluster, CPhT-Adv  Pharmacy Patient Advocate Specialist  Direct Number: [phone] Fax: [phone]    Last read by [name] at 12:16 PM on 04/21/2023.

## 2023-04-24 ENCOUNTER — Encounter: Payer: Self-pay | Admitting: Hematology

## 2023-04-29 ENCOUNTER — Other Ambulatory Visit: Payer: Self-pay

## 2023-04-29 DIAGNOSIS — E538 Deficiency of other specified B group vitamins: Secondary | ICD-10-CM

## 2023-04-29 DIAGNOSIS — D509 Iron deficiency anemia, unspecified: Secondary | ICD-10-CM

## 2023-04-29 MED ORDER — CYANOCOBALAMIN 1000 MCG/ML IJ SOLN: 1000.0000 ug | INTRAMUSCULAR | 0 refills | Status: DC

## 2023-05-05 ENCOUNTER — Encounter: Payer: Self-pay | Admitting: Hematology

## 2023-05-05 NOTE — Telephone Encounter (Signed)
 Pharmacy Patient Advocate Encounter  Received notification from [facility] that Prior Authorization for Ibsrela 50MG  tablets has been APPROVED from 03-03-2023 to 03-02-2024   PA #/Case ID/Reference #: [id number]

## 2023-05-06 NOTE — Telephone Encounter (Signed)
 She mailed [place] back her forms on 05/01/23

## 2023-05-07 NOTE — Telephone Encounter (Signed)
 Hi! I received the patient portion of the ozempic application today, can someone fill out the provider form that I have scanned in media under: "ozempic provider portion blank form". It looks like you guys have written the rx for the most part. Thank you!

## 2023-05-08 ENCOUNTER — Encounter: Payer: Self-pay | Admitting: Hematology

## 2023-05-08 ENCOUNTER — Other Ambulatory Visit (HOSPITAL_COMMUNITY): Payer: Self-pay

## 2023-05-08 ENCOUNTER — Other Ambulatory Visit: Payer: Self-pay

## 2023-05-08 NOTE — Telephone Encounter (Addendum)
 I received the signed provider form and I faxed Novo Nordisk  the completed application

## 2023-05-09 ENCOUNTER — Other Ambulatory Visit: Payer: Self-pay

## 2023-05-09 DIAGNOSIS — D509 Iron deficiency anemia, unspecified: Secondary | ICD-10-CM

## 2023-05-09 DIAGNOSIS — D75839 Thrombocytosis, unspecified: Secondary | ICD-10-CM

## 2023-05-09 DIAGNOSIS — D72829 Elevated white blood cell count, unspecified: Secondary | ICD-10-CM

## 2023-05-09 DIAGNOSIS — D649 Anemia, unspecified: Secondary | ICD-10-CM

## 2023-05-10 ENCOUNTER — Other Ambulatory Visit: Payer: Self-pay | Admitting: Physician Assistant

## 2023-05-10 ENCOUNTER — Other Ambulatory Visit: Payer: Self-pay | Admitting: Cardiology

## 2023-05-12 ENCOUNTER — Inpatient Hospital Stay: Payer: Medicare Other | Attending: Oncology

## 2023-05-12 DIAGNOSIS — D72829 Elevated white blood cell count, unspecified: Secondary | ICD-10-CM | POA: Insufficient documentation

## 2023-05-12 DIAGNOSIS — D509 Iron deficiency anemia, unspecified: Secondary | ICD-10-CM | POA: Insufficient documentation

## 2023-05-12 DIAGNOSIS — D649 Anemia, unspecified: Secondary | ICD-10-CM

## 2023-05-12 DIAGNOSIS — D75839 Thrombocytosis, unspecified: Secondary | ICD-10-CM | POA: Insufficient documentation

## 2023-05-12 DIAGNOSIS — Z79899 Other long term (current) drug therapy: Secondary | ICD-10-CM | POA: Diagnosis not present

## 2023-05-12 LAB — COMPREHENSIVE METABOLIC PANEL WITH GFR
ALT: 21 U/L (ref 0–44)
AST: 21 U/L (ref 15–41)
Albumin: 4.2 g/dL (ref 3.5–5.0)
Alkaline Phosphatase: 98 U/L (ref 38–126)
Anion gap: 10 (ref 5–15)
BUN: 20 mg/dL (ref 6–20)
CO2: 25 mmol/L (ref 22–32)
Calcium: 9.7 mg/dL (ref 8.9–10.3)
Chloride: 103 mmol/L (ref 98–111)
Creatinine, Ser: 0.76 mg/dL (ref 0.44–1.00)
GFR, Estimated: >60 mL/min (ref >60)
Glucose, Bld: 98 mg/dL (ref 70–99)
Potassium: 4 mmol/L (ref 3.5–5.1)
Sodium: 138 mmol/L (ref 135–145)
Total Bilirubin: 0.5 mg/dL (ref 0.0–1.2)
Total Protein: 7.6 g/dL (ref 6.5–8.1)

## 2023-05-12 LAB — IRON AND TIBC
Iron: 36 ug/dL (ref 28–170)
Saturation Ratios: 11 % (ref 10.4–31.8)
TIBC: 342 ug/dL (ref 250–450)
UIBC: 306 ug/dL

## 2023-05-12 LAB — CBC WITH DIFFERENTIAL/PLATELET
Abs Immature Granulocytes: 0.14 10*3/uL — ABNORMAL HIGH (ref 0.00–0.07)
Basophils Absolute: 0.1 10*3/uL (ref 0.0–0.1)
Basophils Relative: 1 %
Eosinophils Absolute: 0.5 10*3/uL (ref 0.0–0.5)
Eosinophils Relative: 3 %
HCT: 37.6 % (ref 36.0–46.0)
Hemoglobin: 12.1 g/dL (ref 12.0–15.0)
Immature Granulocytes: 1 %
Lymphocytes Relative: 19 %
Lymphs Abs: 2.5 10*3/uL (ref 0.7–4.0)
MCH: 29.7 pg (ref 26.0–34.0)
MCHC: 32.2 g/dL (ref 30.0–36.0)
MCV: 92.2 fL (ref 80.0–100.0)
Monocytes Absolute: 1.2 10*3/uL — ABNORMAL HIGH (ref 0.1–1.0)
Monocytes Relative: 9 %
Neutro Abs: 8.9 10*3/uL — ABNORMAL HIGH (ref 1.7–7.7)
Neutrophils Relative %: 67 %
Platelets: 392 10*3/uL (ref 150–400)
RBC: 4.08 MIL/uL (ref 3.87–5.11)
RDW: 14.1 % (ref 11.5–15.5)
WBC: 13.2 10*3/uL — ABNORMAL HIGH (ref 4.0–10.5)
nRBC: 0 % (ref 0.0–0.2)

## 2023-05-12 LAB — VITAMIN B12: Vitamin B-12: 1449 pg/mL — ABNORMAL HIGH (ref 180–914)

## 2023-05-12 LAB — FERRITIN: Ferritin: 359 ng/mL — ABNORMAL HIGH (ref 11–307)

## 2023-05-12 NOTE — Telephone Encounter (Signed)
 PAP: Patient has been denied for patient assistance by [employer] due to patient has private/commercial insurance

## 2023-05-13 ENCOUNTER — Other Ambulatory Visit: Payer: Self-pay | Admitting: Cardiology

## 2023-05-13 ENCOUNTER — Telehealth: Payer: Self-pay | Admitting: Pharmacist

## 2023-05-13 MED ORDER — OZEMPIC (0.25 OR 0.5 MG/DOSE) 2 MG/1.5ML ~~LOC~~ SOPN: 0.2500 mg | PEN_INJECTOR | SUBCUTANEOUS | 1 refills | Status: DC

## 2023-05-13 MED ORDER — SEMAGLUTIDE (1 MG/DOSE) 4 MG/3ML ~~LOC~~ SOPN: 1.0000 mg | PEN_INJECTOR | SUBCUTANEOUS | 0 refills | Status: DC

## 2023-05-13 MED ORDER — SEMAGLUTIDE(0.25 OR 0.5MG/DOS) 2 MG/3ML ~~LOC~~ SOPN: 0.2500 mg | PEN_INJECTOR | SUBCUTANEOUS | 1 refills | Status: DC

## 2023-05-13 NOTE — Progress Notes (Signed)
 [facility] [address], [place] [address]   CLINIC:  Medical Oncology/Hematology  PCP:  Exie Parody [address] [place] [address] [phone]   REASON FOR VISIT:  Follow-up for leukocytosis and thrombocytosis  PRIOR THERAPY: None  CURRENT THERAPY: Observation  INTERVAL HISTORY:  [name] 53 y.o. female returns for routine follow-up of leukocytosis.  She was last seen in our clinic on 02/03/2023.  She denies any interval hospitalizations, surgeries or changes to her baseline health.  She was seen at [facility] urgent care for a cough and URI last month after testing positive for COVID.  She had a chest x-ray which was negative.  She is still recovering and has residual cough.  She uses albuterol inhaler as needed.  She is followed by cardiology.  She was seen by [facility] surgery by Dr. Cliffton Asters for chronic idiopathic constipation and diverticulitis.   Reports she has ongoing fatigue and left lower extremity aching and pain worse at nighttime.  This is causing her to sleep poorly.   She denies any episodes of rectal bleeding, bright red blood per rectum, melena or hematochezia.  Her weight is stable.  Reports low energy and appetite is 100%.    REVIEW OF SYSTEMS:  Review of Systems  Constitutional:  Positive for fatigue.  Respiratory:  Positive for cough.   Gastrointestinal:  Positive for constipation.  Psychiatric/Behavioral:  Positive for sleep disturbance.     PAST MEDICAL/SURGICAL HISTORY:  Past Medical History:  Diagnosis Date   Allergies    Anemia    Anxiety    Arthritis    Atypical mole 11/03/2006   mid upper back (slight to moderate)   Atypical mole 11/03/2006   lower right back (slight to moderate)   Back pain    Bipolar 1 disorder (HCC)    Borderline diabetic    Chronic constipation    Depression    Diabetes (HCC)    Diverticulitis    Family history of adverse reaction to anesthesia    mother--  ponv   Fatty liver    GAD (generalized anxiety disorder)    GERD (gastroesophageal reflux disease)    Hiatal hernia    High triglycerides    History of kidney stones    History of recurrent UTIs    History of sepsis 06/2018   History of stomach ulcers    History of suicidal ideation    Hyperlipidemia    Hypothyroidism    IDA (iron deficiency anemia)    Joint pain    Melanoma (HCC) 11/03/2006   left chest in situ (excision)   Orthostasis    Palpitations    PONV (postoperative nausea and vomiting)    Rapid heart rate    Renal calculus, left    Seasonal allergies    Shortness of breath    Sleep disorder, unspecified    per excessive sleeping during the day   Snoring    Suicide ideation    Swallowing difficulty    Tachycardia    Vertigo    Vitamin D deficiency    Weakness    Wears contact lenses    Past Surgical History:  Procedure Laterality Date   ANTERIOR CERVICAL DECOMP/DISCECTOMY FUSION  02/17/2012   Procedure: ANTERIOR CERVICAL DECOMPRESSION/DISCECTOMY FUSION 2 LEVELS;  Surgeon: Hewitt Shorts, MD;  Location: [facility];  Service: Neurosurgery;  Laterality: N/A;  Cervical four-five and Cervical six-seven anterior cervial decompression with fusion plating and bonegraft  ANTERIOR CERVICAL DECOMP/DISCECTOMY FUSION  04-01-2001   @[facility]    C5---6   BIOPSY  07/20/2018   Procedure: BIOPSY;  Surgeon: Corbin Ade, MD;  Location: [facility];  Service: Endoscopy;;  gastric    BREAST REDUCTION SURGERY Bilateral 1995   CESAREAN SECTION  x2   last one 1998   with BILATERAL TUBAL LIGATION   CESAREAN SECTION WITH BILATERAL TUBAL LIGATION     COLONOSCOPY WITH PROPOFOL N/A 07/20/2018   Dr. Jena Gauss: Diverticulosis, internal hemorrhoids, 5 mm splenic flexure tubular adenoma removed. Repeat in 5 years due to family history.   COLONOSCOPY WITH PROPOFOL N/A 12/07/2020   Procedure: COLONOSCOPY WITH PROPOFOL;  Surgeon: Corbin Ade, MD;  Location: [facility];  Service:  Endoscopy;  Laterality: N/A;  2:30pm   CYSTOSCOPY/URETEROSCOPY/HOLMIUM LASER/STENT PLACEMENT Left 08/21/2018   Procedure: CYSTOSCOPY LEFT RETROGRADE PYELOGRAM /URETEROSCOPY/HOLMIUM LASER/STENT PLACEMENT;  Surgeon: Rene Paci, MD;  Location: [facility];  Service: Urology;  Laterality: Left;   DILATION AND CURETTAGE OF UTERUS  1992   for miscarriage   ENDOMETRIAL ABLATION  2014   ESOPHAGOGASTRODUODENOSCOPY     20 years ago.    ESOPHAGOGASTRODUODENOSCOPY (EGD) WITH PROPOFOL N/A 07/20/2018   Dr. Jena Gauss: Small hiatal hernia, erythematous mucosa in the stomach with a healing gastric ulcer measuring 1 cm, biopsies negative.   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   POLYPECTOMY  07/20/2018   Procedure: POLYPECTOMY;  Surgeon: Corbin Ade, MD;  Location: [facility];  Service: Endoscopy;;   POSTERIOR CERVICAL FUSION/FORAMINOTOMY N/A 08/18/2013   Procedure: CERVICAL FOUR TO CERVICAL SEVEN POSTERIOR CERVICAL FUSION/FORAMINOTOMY LEVEL 3;  Surgeon: Hewitt Shorts, MD;  Location: [facility];  Service: Neurosurgery;  Laterality: N/A;  C4-7 posterior cervical fusion with lateral mass fixation   RIGHT/LEFT HEART CATH AND CORONARY ANGIOGRAPHY N/A 12/23/2019   Procedure: RIGHT/LEFT HEART CATH AND CORONARY ANGIOGRAPHY;  Surgeon: Kathleene Hazel, MD;  Location: [facility];  Service: Cardiovascular;  Laterality: N/A;     SOCIAL HISTORY:  Social History   Socioeconomic History   Marital status: Married    Spouse name: Not on file    Number of children: 2   Years of education: Not on file   Highest education level: Not on file  Occupational History   Occupation: disability  Tobacco Use   Smoking status: Former    Current packs/day: 0.00    Average packs/day: 1 pack/day for 5.0 years (5.0 ttl pk-yrs)    Types: Cigarettes    Start date: 02/09/1997    Quit date: 02/09/2002    Years since quitting: 21.2    Passive exposure: Past   Smokeless tobacco: Never  Vaping Use   Vaping status: Never Used  Substance and Sexual Activity   Alcohol use: No   Drug use: No   Sexual activity: Yes    Partners: Male    Birth control/protection: Surgical    Comment: spouse  Other Topics Concern   Not on file  Social History Narrative   Step brother-2   Step sister-2  Half sister -1 healthy   Social Drivers of Corporate investment banker Strain: Low Risk  (09/21/2022)   [facility]   Overall Financial Resource Strain (CARDIA)    Difficulty of Paying Living Expenses: Not hard at all  Food Insecurity: No Food Insecurity (09/21/2022)   Received from [facility]   Hunger Vital Sign    Worried About Running Out of Food in the Last Year: Never true    Ran Out of Food in the Last Year: Never true  Transportation Needs: No Transportation Needs (09/21/2022)   Received from [facility] - Transportation    Lack of Transportation (Medical): No    Lack of Transportation (Non-Medical): No  Physical Activity: Sufficiently Active (09/21/2022)   Received from [facility]   Exercise Vital Sign    Days of Exercise per Week: 5 days    Minutes of Exercise per Session: 30 min  Stress: No Stress Concern Present (09/21/2022)   Received from [facility] - Occupational Stress Questionnaire    Feeling of Stress : Not at all  Social Connections: Socially Integrated (09/21/2022)   Received from [facility]   Social Connection and Isolation Panel [NHANES]     Frequency of Communication with Friends and Family: More than three times a week    Frequency of Social Gatherings with Friends and Family: Three times a week    Attends Religious Services: More than 4 times per year    Active Member of Clubs or Organizations: Yes    Attends Banker Meetings: 1 to 4 times per year    Marital Status: Married  Catering manager Violence: Not At Risk (09/21/2022)   Received from [facility]   Humiliation, Afraid, Rape, and Kick questionnaire    Fear of Current or Ex-Partner: No    Emotionally Abused: No    Physically Abused: No    Sexually Abused: No    FAMILY HISTORY:  Family History  Problem Relation Age of Onset   Asthma Mother    Rheum arthritis Mother    Non-Hodgkin's lymphoma Mother    Congestive Heart Failure Mother    Atrial fibrillation Mother    Diabetes Mother    Hyperlipidemia Mother    Cancer Mother    Colon cancer Father    Multiple myeloma Father    Hypertension Father    Cancer Father    Depression Father    Anxiety disorder Father    Alcoholism Father    Obesity Father    Diabetes Maternal Grandmother    Congestive Heart Failure Maternal Grandmother    Mental illness Paternal Grandmother    Colon cancer Paternal Grandfather    Bone cancer Paternal Grandfather    Allergies Other        entire family(mom and dad)   Liver disease Neg Hx     CURRENT MEDICATIONS:  Outpatient Encounter Medications as of 05/14/2023  Medication Sig   acetaminophen (TYLENOL) 500 MG tablet Take 1,000 mg by mouth every 6 (six) hours as needed for moderate pain.   ALPRAZolam (XANAX) 1 MG tablet TAKE 1 TABLET BY MOUTH 4 TIMES DAILY AS NEEDED FOR ANXIETY   aspirin EC 81 MG tablet Take 1 tablet (81 mg total) by mouth daily. Swallow whole. (Patient not taking: Reported on 04/01/2023)   busPIRone (BUSPAR) 15 MG tablet Take 2 tablets by mouth twice daily   cetirizine (  ZYRTEC) 10 MG tablet Take 10 mg by mouth daily.   cholecalciferol  (VITAMIN D3) 25 MCG (1000 UNIT) tablet Take 2,000 Units by mouth every other day. (Patient not taking: Reported on 04/01/2023)   cyanocobalamin (VITAMIN B12) 1000 MCG/ML injection Inject 1 mL (1,000 mcg total) into the muscle once a week. 1 ml weekly X 4 and monthly X 6   cyclobenzaprine (FLEXERIL) 10 MG tablet Take 1 tablet (10 mg total) by mouth 3 (three) times daily as needed for muscle spasms. (Patient not taking: Reported on 04/01/2023)   Dextromethorphan-buPROPion ER (AUVELITY) 45-105 MG TBCR Take 1 tablet by mouth twice daily   fenofibrate (TRICOR) 145 MG tablet Take 1 tablet by mouth once daily   fluticasone (FLONASE) 50 MCG/ACT nasal spray Place 1 spray into both nostrils daily.   hydrOXYzine (ATARAX) 50 MG tablet Take 1 tablet (50 mg total) by mouth every 6 (six) hours as needed.   lamoTRIgine (LAMICTAL) 200 MG tablet TAKE 2 & 1/2 (TWO & ONE-HALF) TABLETS BY MOUTH ONCE DAILY   levothyroxine (SYNTHROID) 75 MCG tablet Take 75 mcg by mouth daily.   loperamide (IMODIUM A-D) 2 MG tablet Take 1 tablet (2 mg total) by mouth 4 (four) times daily as needed for diarrhea or loose stools. (Patient not taking: Reported on 04/01/2023)   meclizine (ANTIVERT) 25 MG tablet Take 25 mg by mouth 3 (three) times daily as needed for dizziness.   metFORMIN (GLUCOPHAGE-XR) 750 MG 24 hr tablet Take 750 mg by mouth daily.   ondansetron (ZOFRAN) 8 MG tablet Take 1 tablet (8 mg total) by mouth every 8 (eight) hours as needed for nausea or vomiting.   ondansetron (ZOFRAN-ODT) 8 MG disintegrating tablet Take 1 tablet (8 mg total) by mouth 2 (two) times daily as needed for nausea or vomiting.   Oxcarbazepine (TRILEPTAL) 300 MG tablet TAKE 3 TABLETS BY MOUTH AT BEDTIME   pantoprazole (PROTONIX) 40 MG tablet TAKE 1 TABLET BY MOUTH TWICE DAILY WITH A MEAL (Patient not taking: Reported on 04/01/2023)   predniSONE (DELTASONE) 20 MG tablet Take 2 tablets (40 mg total) by mouth daily with breakfast. (Patient not taking: Reported on  04/01/2023)   rosuvastatin (CRESTOR) 5 MG tablet Take 5 mg by mouth daily.   Semaglutide, 1 MG/DOSE, 4 MG/3ML SOPN Inject 1 mg into the skin once a week.   Semaglutide,0.25 or 0.5MG /DOS, (OZEMPIC, 0.25 OR 0.5 MG/DOSE,) 2 MG/1.5ML SOPN Inject 0.25 mg into the skin once a week.   sertraline (ZOLOFT) 100 MG tablet TAKE 3 TABLETS BY MOUTH AT BEDTIME   Tenapanor HCl (IBSRELA) 50 MG TABS Take 1 tablet by mouth 2 (two) times daily. (Patient not taking: Reported on 04/01/2023)   valACYclovir (VALTREX) 500 MG tablet Take 500 mg by mouth daily. (Patient not taking: Reported on 04/01/2023)   No facility-administered encounter medications on file as of 05/14/2023.    ALLERGIES:  Allergies  Allergen Reactions   Iohexol Hives     Desc: IVP DYE Also developed hives when drinking this contrast responded to benadryl.  Desc: IVP DYE  Desc: IVP DYE Also developed hives when drinking this contrast responded to benadryl.   Rosuvastatin Other (See Comments)    Muscle aches with 10mg  and 20mg , tolerates 5mg  daily without issues Muscle aches with 20mg   Muscle aches with 10mg  and 20mg , tolerates 5mg  daily without issues    Diatrizoate Other (See Comments)     PHYSICAL EXAM:  ECOG PERFORMANCE STATUS: 1 - Symptomatic but completely ambulatory  There  were no vitals filed for this visit. There were no vitals filed for this visit. Physical Exam Constitutional:      Appearance: Normal appearance.  Cardiovascular:     Rate and Rhythm: Normal rate and regular rhythm.  Pulmonary:     Effort: Prolonged expiration present.     Breath sounds: Decreased breath sounds present.  Abdominal:     General: Bowel sounds are normal.     Palpations: Abdomen is soft.  Musculoskeletal:        General: No swelling. Normal range of motion.  Neurological:     Mental Status: She is alert and oriented to person, place, and time. Mental status is at baseline.      LABORATORY DATA:  I have reviewed the labs as listed.   CBC    Component Value Date/Time   WBC 13.2 (H) 05/12/2023 1044   RBC 4.08 05/12/2023 1044   HGB 12.1 05/12/2023 1044   HGB 12.0 06/06/2022 1029   HGB 12.9 12/17/2019 1052   HCT 37.6 05/12/2023 1044   HCT 39.0 12/17/2019 1052   PLT 392 05/12/2023 1044   PLT 445 (H) 06/06/2022 1029   PLT 425 12/17/2019 1052   MCV 92.2 05/12/2023 1044   MCV 86 12/17/2019 1052   MCH 29.7 05/12/2023 1044   MCHC 32.2 05/12/2023 1044   RDW 14.1 05/12/2023 1044   RDW 13.5 12/17/2019 1052   LYMPHSABS 2.5 05/12/2023 1044   LYMPHSABS 3.0 12/17/2019 1052   MONOABS 1.2 (H) 05/12/2023 1044   EOSABS 0.5 05/12/2023 1044   EOSABS 0.8 (H) 12/17/2019 1052   BASOSABS 0.1 05/12/2023 1044   BASOSABS 0.1 12/17/2019 1052      Latest Ref Rng & Units 05/12/2023   10:44 AM 01/30/2023    9:07 AM 11/22/2022   10:43 AM  CMP  Glucose 70 - 99 mg/dL 98  78  78   BUN 6 - 20 mg/dL 20  25  24    Creatinine 0.44 - 1.00 mg/dL 1.91  4.78  2.95   Sodium 135 - 145 mmol/L 138  139  138   Potassium 3.5 - 5.1 mmol/L 4.0  4.0  3.9   Chloride 98 - 111 mmol/L 103  104  104   CO2 22 - 32 mmol/L 25  24  26    Calcium 8.9 - 10.3 mg/dL 9.7  62.1  9.8   Total Protein 6.5 - 8.1 g/dL 7.6  7.5  7.8   Total Bilirubin 0.0 - 1.2 mg/dL 0.5  0.4  0.5   Alkaline Phos 38 - 126 U/L 98  60  70   AST 15 - 41 U/L 21  20  20    ALT 0 - 44 U/L 21  16  17      DIAGNOSTIC IMAGING:  I have independently reviewed the relevant imaging and discussed with the patient.  ASSESSMENT & PLAN: 1.  Neutrophilic leukocytosis and thrombocytosis - JAK2 CALR, and MPL testing was negative; negative BCR/ABL FISH testing; normal LDH, negative SPEP testing.  - History of intermittent neutrophilic leukocytosis since 2009, with WBC up to to 20.5 -Repeat JAK2 CALR and MPL testing was negative on 06/06/2022. - Ultrasound of the abdomen on 05/27/2019 showed probable fatty infiltration of the liver with minimal splenic enlargement measuring 12.1 cm in length with volume  calculated to 502 mL.  - She continues to have diverticulitis flares most recently treated in November at [facility] ED. -She is followed by gastroenterology and her last colonoscopy was in October of  2022 which showed diverticulosis of the entire colon without evidence of diverticulitis.  No other abnormality. -She received 1 g IV iron (INFeD) on 10/29/2022 with good tolerance. -Has intermittent headaches and dizziness but otherwise no hyperviscosity symptoms.  Reports this is chronic for her. 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 -Given severe fatigue, recommend 1 dose of IV Feraheme 510 mg.  Ferritin is 359, hemoglobin 12.1 with declining iron saturations 11%. -We discussed at length potentially having a bone marrow biopsy in the future should counts continue to drop and significant family history for blood cancers despite adequate B12 and iron levels.  She would like to hold off at this time and monitor and we will recheck her labs in 3 months.  2.  Family history: -Father had myeloma and paternal grandfather also had myeloma.  Mother had non-Hodgkin's lymphoma.  Maternal uncle had non-Hodgkin's lymphoma.  Paternal aunt had colon cancer.  3.  Elevated B12 and folate levels: - She received 1 B12 injection in clinic on 11/29/2022 and self administers B12 monthly at home. -Repeat vitamin B12 levels are 633 (1106) and folate was greater than 40. -Will recheck B12 levels 6 months from previous.  4.  Bilateral lower extremity pain/cramping: - Reports this has improved some. -Previously given a prednisone taper and Flexeril which she uses intermittently.   5.  Neck and back pain: -Follow-up with pain clinic as needed-stable  6.  Dizziness: -Continue meclizine and Zofran for nausea-improving  7.  Anxiety: -She is followed by psychiatry. -Continue Xanax 1 mg, 1 po qid prn.  -Continue Wellbutrin XL 150 mg, 3 p.o. every  morning. -Continue Buspar 15 mg, to 2 po twice daily. -Continue hydroxyzine to 50 mg, 1 p.o. every 6 hours as needed anxiety.   -Continue Lamictal 200 mg, 2.5 pills daily. -Continue Trileptal 300 mg, 3 nightly. -Continue Zoloft 100 mg, 3 p.o. daily. -Continue counseling with Vick Frees.   8.  Fatigue: -We discussed this is likely multifactorial given the amount of medication she is on it is likely related to a few of those along with B12 and iron deficiency although numbers are fairly stable at this time. -Will give her 510 mg iron infusion x 1 dose.  9.  Cough: -Respiratory exam shows delayed expiration likely indicating post-COVID inflammation. -We discussed prednisone taper-take 6 tabs on day 1 followed by 5 tabs on day 2 and decrease by 1 tablet each day until complete.  Make sure you take in the morning to avoid sleep disruption.  Can cause irritability.    PLAN SUMMARY & DISPOSITION: PLAN SUMMARY: >> Recommend 1 dose of IV Feraheme 510 mg. >> Continue B12 injections at home monthly.   >> Recommend prednisone taper for post-COVID inflammation and cough. >> Repeat chest x-ray should symptoms not improve. >> Return to clinic in 3 months for lab work and see provider.     All questions were answered. The patient knows to call the clinic with any problems, questions or concerns.  Medical decision making: Moderate  Time spent on visit: I spent 25 minutes dedicated to the care of this patient (face-to-face and non-face-to-face) on the date of the encounter to include what is described in the assessment and plan.  Durenda Hurt, NP 05/13/2023 3:16 PM

## 2023-05-13 NOTE — Addendum Note (Signed)
 Addended by: Tylene Fantasia on: 05/13/2023 04:14 PM   Modules accepted: Orders

## 2023-05-13 NOTE — Telephone Encounter (Signed)
 Patient called reports she will go back on Ozempic as her BG started going up.  She has been off of it for few months so wants to start from lowest dose. Prescription for 0.25 and 0.5 sent to pharmacy.

## 2023-05-14 ENCOUNTER — Inpatient Hospital Stay: Payer: Self-pay | Attending: Oncology | Admitting: Oncology

## 2023-05-14 ENCOUNTER — Other Ambulatory Visit: Payer: Medicare Other

## 2023-05-14 DIAGNOSIS — R059 Cough, unspecified: Secondary | ICD-10-CM | POA: Insufficient documentation

## 2023-05-14 DIAGNOSIS — Z800 Family history of malignant neoplasm of digestive organs: Secondary | ICD-10-CM | POA: Diagnosis not present

## 2023-05-14 DIAGNOSIS — Z808 Family history of malignant neoplasm of other organs or systems: Secondary | ICD-10-CM | POA: Insufficient documentation

## 2023-05-14 DIAGNOSIS — R5383 Other fatigue: Secondary | ICD-10-CM | POA: Diagnosis not present

## 2023-05-14 DIAGNOSIS — Z825 Family history of asthma and other chronic lower respiratory diseases: Secondary | ICD-10-CM | POA: Insufficient documentation

## 2023-05-14 DIAGNOSIS — Z8719 Personal history of other diseases of the digestive system: Secondary | ICD-10-CM | POA: Insufficient documentation

## 2023-05-14 DIAGNOSIS — G479 Sleep disorder, unspecified: Secondary | ICD-10-CM | POA: Diagnosis not present

## 2023-05-14 DIAGNOSIS — D509 Iron deficiency anemia, unspecified: Secondary | ICD-10-CM | POA: Diagnosis not present

## 2023-05-14 DIAGNOSIS — Z83438 Family history of other disorder of lipoprotein metabolism and other lipidemia: Secondary | ICD-10-CM | POA: Diagnosis not present

## 2023-05-14 DIAGNOSIS — D649 Anemia, unspecified: Secondary | ICD-10-CM

## 2023-05-14 DIAGNOSIS — Z8349 Family history of other endocrine, nutritional and metabolic diseases: Secondary | ICD-10-CM | POA: Insufficient documentation

## 2023-05-14 DIAGNOSIS — Z888 Allergy status to other drugs, medicaments and biological substances status: Secondary | ICD-10-CM | POA: Diagnosis not present

## 2023-05-14 DIAGNOSIS — K5909 Other constipation: Secondary | ICD-10-CM | POA: Diagnosis not present

## 2023-05-14 DIAGNOSIS — Z833 Family history of diabetes mellitus: Secondary | ICD-10-CM | POA: Diagnosis not present

## 2023-05-14 DIAGNOSIS — Z79899 Other long term (current) drug therapy: Secondary | ICD-10-CM | POA: Diagnosis not present

## 2023-05-14 DIAGNOSIS — Z8261 Family history of arthritis: Secondary | ICD-10-CM | POA: Diagnosis not present

## 2023-05-14 DIAGNOSIS — Z7982 Long term (current) use of aspirin: Secondary | ICD-10-CM | POA: Diagnosis not present

## 2023-05-14 DIAGNOSIS — Z87891 Personal history of nicotine dependence: Secondary | ICD-10-CM | POA: Diagnosis not present

## 2023-05-14 DIAGNOSIS — Z8249 Family history of ischemic heart disease and other diseases of the circulatory system: Secondary | ICD-10-CM | POA: Diagnosis not present

## 2023-05-14 DIAGNOSIS — Z811 Family history of alcohol abuse and dependence: Secondary | ICD-10-CM | POA: Insufficient documentation

## 2023-05-14 DIAGNOSIS — D75839 Thrombocytosis, unspecified: Secondary | ICD-10-CM | POA: Diagnosis not present

## 2023-05-14 DIAGNOSIS — M79605 Pain in left leg: Secondary | ICD-10-CM | POA: Insufficient documentation

## 2023-05-14 DIAGNOSIS — K5730 Diverticulosis of large intestine without perforation or abscess without bleeding: Secondary | ICD-10-CM | POA: Insufficient documentation

## 2023-05-14 DIAGNOSIS — D72829 Elevated white blood cell count, unspecified: Secondary | ICD-10-CM | POA: Diagnosis present

## 2023-05-14 DIAGNOSIS — Z818 Family history of other mental and behavioral disorders: Secondary | ICD-10-CM | POA: Insufficient documentation

## 2023-05-14 DIAGNOSIS — K5904 Chronic idiopathic constipation: Secondary | ICD-10-CM | POA: Diagnosis not present

## 2023-05-14 DIAGNOSIS — Z79624 Long term (current) use of inhibitors of nucleotide synthesis: Secondary | ICD-10-CM | POA: Insufficient documentation

## 2023-05-14 DIAGNOSIS — E538 Deficiency of other specified B group vitamins: Secondary | ICD-10-CM

## 2023-05-14 DIAGNOSIS — Z860101 Personal history of adenomatous and serrated colon polyps: Secondary | ICD-10-CM | POA: Insufficient documentation

## 2023-05-14 MED ORDER — PREDNISONE 10 MG PO TABS: 40.0000 mg | ORAL_TABLET | Freq: Every day | ORAL | 0 refills | Status: DC

## 2023-05-14 MED ORDER — CYANOCOBALAMIN 1000 MCG/ML IJ SOLN: 1000.0000 ug | INTRAMUSCULAR | 3 refills | Status: AC

## 2023-05-15 ENCOUNTER — Telehealth: Payer: Self-pay | Admitting: Cardiology

## 2023-05-15 MED ORDER — ROSUVASTATIN CALCIUM 5 MG PO TABS: 5.0000 mg | ORAL_TABLET | Freq: Every day | ORAL | 1 refills | Status: AC

## 2023-05-15 MED ORDER — FENOFIBRATE 145 MG PO TABS: 145.0000 mg | ORAL_TABLET | Freq: Every day | ORAL | 1 refills | Status: AC

## 2023-05-15 NOTE — Telephone Encounter (Signed)
*  STAT* If patient is at the pharmacy, call can be transferred to refill team.   1. Which medications need to be refilled? (please list name of each medication and dose if known) rosuvastatin (CRESTOR) 5 MG tablet   fenofibrate (TRICOR) 145 MG tablet      2. Would you like to learn more about the convenience, safety, & potential cost savings by using the [facility]? No    3. Are you open to using the [facility] (Type [facility]. ). No   4. Which pharmacy/location (including street and city if local pharmacy) is medication to be sent to?  [facility] [address] [place] - [address]     5. Do they need a 30 day or 90 day supply? 90 day   Pt has upcoming appt 06/26/23

## 2023-05-16 LAB — METHYLMALONIC ACID, SERUM: Methylmalonic Acid, Quantitative: 211 nmol/L (ref 0–378)

## 2023-05-21 ENCOUNTER — Inpatient Hospital Stay

## 2023-05-28 ENCOUNTER — Inpatient Hospital Stay

## 2023-05-28 VITALS — BP 107/64 | HR 68 | Temp 97.3°F | Resp 18

## 2023-05-28 DIAGNOSIS — D72829 Elevated white blood cell count, unspecified: Secondary | ICD-10-CM | POA: Diagnosis not present

## 2023-05-28 DIAGNOSIS — D509 Iron deficiency anemia, unspecified: Secondary | ICD-10-CM

## 2023-05-28 MED ORDER — SODIUM CHLORIDE 0.9 % IV SOLN: INTRAVENOUS | Status: DC

## 2023-05-28 MED ORDER — ACETAMINOPHEN 325 MG PO TABS
650.0000 mg | ORAL_TABLET | Freq: Once | ORAL | Status: AC
  Administered 2023-05-28: 650 mg via ORAL
  Filled 2023-05-28: qty 2

## 2023-05-28 MED ORDER — CETIRIZINE HCL 10 MG/ML IV SOLN
10.0000 mg | Freq: Once | INTRAVENOUS | Status: AC
  Administered 2023-05-28: 10 mg via INTRAVENOUS
  Filled 2023-05-28: qty 1

## 2023-05-28 MED ORDER — CYANOCOBALAMIN 1000 MCG/ML IJ SOLN
1000.0000 ug | Freq: Once | INTRAMUSCULAR | Status: AC
  Administered 2023-05-28: 1000 ug via INTRAMUSCULAR
  Filled 2023-05-28: qty 1

## 2023-05-28 MED ORDER — SODIUM CHLORIDE 0.9 % IV SOLN
510.0000 mg | Freq: Once | INTRAVENOUS | Status: AC
  Administered 2023-05-28: 510 mg via INTRAVENOUS
  Filled 2023-05-28: qty 510

## 2023-05-28 NOTE — Patient Instructions (Signed)

## 2023-05-28 NOTE — Progress Notes (Signed)
 Patient presents today for Feraheme infusion per providers order.  Vital signs WNL.  Patient has no new complaints at this time.    Peripheral IV started and blood return noted pre and post infusion.  Stable during administration without incident; injection site WNL; see MAR for injection details.     Stable during infusion without adverse affects.  Vital signs stable.  No complaints at this time.  Discharge from clinic ambulatory in stable condition.  Alert and oriented X 3.  Follow up with [facility] as scheduled.

## 2023-06-01 ENCOUNTER — Other Ambulatory Visit: Payer: Self-pay | Admitting: Physician Assistant

## 2023-06-03 ENCOUNTER — Encounter: Payer: Self-pay | Admitting: Oncology

## 2023-06-03 ENCOUNTER — Telehealth: Payer: Medicare Other | Admitting: Physician Assistant

## 2023-06-03 ENCOUNTER — Encounter: Payer: Self-pay | Admitting: Physician Assistant

## 2023-06-03 DIAGNOSIS — Z6379 Other stressful life events affecting family and household: Secondary | ICD-10-CM

## 2023-06-03 DIAGNOSIS — G2581 Restless legs syndrome: Secondary | ICD-10-CM | POA: Diagnosis not present

## 2023-06-03 DIAGNOSIS — F319 Bipolar disorder, unspecified: Secondary | ICD-10-CM

## 2023-06-03 DIAGNOSIS — F411 Generalized anxiety disorder: Secondary | ICD-10-CM

## 2023-06-03 DIAGNOSIS — F5105 Insomnia due to other mental disorder: Secondary | ICD-10-CM | POA: Diagnosis not present

## 2023-06-03 DIAGNOSIS — F99 Mental disorder, not otherwise specified: Secondary | ICD-10-CM

## 2023-06-03 MED ORDER — ALPRAZOLAM 1 MG PO TABS: 1.0000 mg | ORAL_TABLET | Freq: Four times a day (QID) | ORAL | 2 refills | Status: DC | PRN

## 2023-06-03 MED ORDER — BUSPIRONE HCL 15 MG PO TABS: 30.0000 mg | ORAL_TABLET | Freq: Two times a day (BID) | ORAL | 2 refills | Status: DC

## 2023-06-03 NOTE — Progress Notes (Signed)
 [facility]  Patient ID: Kristen [name],  MRN: [id number]  PCP: Ira Mann  Date of Evaluation: 06/03/2023 Time spent:20 minutes  Chief Complaint:  Chief Complaint   Anxiety; Depression; Follow-up    Virtual Visit via Telehealth  I connected with patient by a video enabled telemedicine application with their informed consent, and verified patient privacy and that I am speaking with the correct person using two identifiers.  I am private, in my office and the patient is at home.  I discussed the limitations, risks, security and privacy concerns of performing an evaluation and management service by video and the availability of in person appointments. I also discussed with the patient that there may be a patient responsible charge related to this service. The patient expressed understanding and agreed to proceed.   I discussed the assessment and treatment plan with the patient. The patient was provided an opportunity to ask questions and all were answered. The patient agreed with the plan and demonstrated an understanding of the instructions.   The patient was advised to call back or seek an in-person evaluation if the symptoms worsen or if the condition fails to improve as anticipated.  I provided 20 minutes of non-face-to-face time during this encounter.  HISTORY/CURRENT STATUS: HPI For routine med check.  Still under a lot of stress with family issues. Also her own health. Is listening to [facility] and learning about [employer], and doing a devotional on overthinkers, which is helpful.   Energy and motivation are good.  Work is going well.  She's not working as much as she did.  No extreme sadness, tearfulness, or feelings of hopelessness.  Sleeps well most of the time. ADLs and personal hygiene are normal.   Denies any changes in concentration, making decisions, or remembering things.  Appetite has not changed.  Weight is stable. Denies suicidal or  homicidal thoughts.  Anxiety is still a big problem.  Stays uptight a lot and feels panicky but the Xanax  helps.  Counseling is also beneficial.  Patient denies increased energy with decreased need for sleep, increased talkativeness, racing thoughts, impulsivity or risky behaviors, increased spending, increased libido, grandiosity, increased irritability or anger, paranoia, or hallucinations.  Denies dizziness, syncope, seizures, numbness, tingling, tremor, tics, unsteady gait, slurred speech, confusion. Denies muscle or joint pain, stiffness, or dystonia.   Individual Medical History/ Review of Systems: Changes? :Yes   had iron  infusion, still getting B12 shots. Had covid, then pneumonia. Better now.   Past medications for mental health diagnoses include: Prozac , Paxil, Lexapro, Celexa, Zoloft , Viibryd , Effexor, Cymbalta , Risperdal , Latuda , Lamictal , VPA, Lithium -became toxic, Xanax , Wellbutrin , Abilify , Traz, Strattera, Kapvay, Saphris, Adderall, Vyvanse , Lunesta, Sonata ,  Vraylar, Seroquel , Geodon, Tegretal  Allergies: Iohexol , Rosuvastatin , and Diatrizoate  Current Medications:  Current Outpatient Medications:    acetaminophen  (TYLENOL ) 500 MG tablet, Take 1,000 mg by mouth every 6 (six) hours as needed for moderate pain., Disp: , Rfl:    cetirizine  (ZYRTEC ) 10 MG tablet, Take 10 mg by mouth daily., Disp: , Rfl:    cyanocobalamin  (VITAMIN B12) 1000 MCG/ML injection, Inject 1 mL (1,000 mcg total) into the muscle once a week. 1 ml weekly X 4 and monthly X 6, Disp: 10 mL, Rfl: 3   Dextromethorphan-buPROPion  ER (AUVELITY ) 45-105 MG TBCR, Take 1 tablet by mouth twice daily, Disp: 60 tablet, Rfl: 1   fenofibrate  (TRICOR ) 145 MG tablet, Take 1 tablet (145 mg total) by mouth daily., Disp: 30 tablet, Rfl: 1   fluticasone  (  FLONASE ) 50 MCG/ACT nasal spray, Place 1 spray into both nostrils daily., Disp: , Rfl:    hydrOXYzine  (ATARAX ) 50 MG tablet, Take 1 tablet (50 mg total) by mouth every 6 (six)  hours as needed., Disp: 120 tablet, Rfl: 5   lamoTRIgine  (LAMICTAL ) 200 MG tablet, TAKE 2 & 1/2 (TWO & ONE-HALF) TABLETS BY MOUTH ONCE DAILY, Disp: 225 tablet, Rfl: 0   levothyroxine  (SYNTHROID ) 75 MCG tablet, Take 75 mcg by mouth daily., Disp: , Rfl:    meclizine  (ANTIVERT ) 25 MG tablet, Take 25 mg by mouth 3 (three) times daily as needed for dizziness., Disp: , Rfl:    metFORMIN  (GLUCOPHAGE -XR) 750 MG 24 hr tablet, Take 750 mg by mouth daily., Disp: , Rfl:    ondansetron  (ZOFRAN ) 8 MG tablet, Take 1 tablet (8 mg total) by mouth every 8 (eight) hours as needed for nausea or vomiting., Disp: 90 tablet, Rfl: 2   ondansetron  (ZOFRAN -ODT) 8 MG disintegrating tablet, Take 1 tablet (8 mg total) by mouth 2 (two) times daily as needed for nausea or vomiting., Disp: 60 tablet, Rfl: 5   Oxcarbazepine  (TRILEPTAL ) 300 MG tablet, TAKE 3 TABLETS BY MOUTH AT BEDTIME, Disp: 270 tablet, Rfl: 0   predniSONE  (DELTASONE ) 10 MG tablet, Take 4 tablets (40 mg total) by mouth daily with breakfast., Disp: 90 tablet, Rfl: 0   rosuvastatin  (CRESTOR ) 5 MG tablet, Take 1 tablet (5 mg total) by mouth daily., Disp: 30 tablet, Rfl: 1   Semaglutide , 1 MG/DOSE, 4 MG/3ML SOPN, Inject 1 mg into the skin once a week., Disp: 3 mL, Rfl: 0   sertraline  (ZOLOFT ) 100 MG tablet, TAKE 3 TABLETS BY MOUTH AT BEDTIME, Disp: 90 tablet, Rfl: 0   Tenapanor HCl (IBSRELA ) 50 MG TABS, Take 1 tablet by mouth 2 (two) times daily., Disp: 60 tablet, Rfl: 5   valACYclovir (VALTREX) 500 MG tablet, Take 500 mg by mouth daily., Disp: , Rfl:    ALPRAZolam  (XANAX ) 1 MG tablet, Take 1 tablet (1 mg total) by mouth 4 (four) times daily as needed for anxiety., Disp: 120 tablet, Rfl: 2   busPIRone  (BUSPAR ) 15 MG tablet, Take 2 tablets (30 mg total) by mouth 2 (two) times daily., Disp: 120 tablet, Rfl: 2   Semaglutide ,0.25 or 0.5MG /DOS, 2 MG/3ML SOPN, Inject 0.25 mg into the skin once a week. Use 0.25 mg dose for 4 weeks then increase it to 0.5 mg once a week  (Patient not taking: Reported on 06/03/2023), Disp: 3 mL, Rfl: 1 Medication Side Effects:  Sweating  Family Medical/ Social History: Changes?  No  MENTAL HEALTH EXAM:  There were no vitals taken for this visit.There is no height or weight on file to calculate BMI.  General Appearance: Casual and Well Groomed  Eye Contact:  Good  Speech:  Clear and Coherent and Normal Rate  Volume:  Normal  Mood:  Euthymic  Affect:  Congruent  Thought Process:  Goal Directed and Descriptions of Associations: Circumstantial  Orientation:  Full (Time, Place, and Person)  Thought Content: Logical   Suicidal Thoughts:  No  Homicidal Thoughts:  No  Memory:  WNL  Judgement:  Good  Insight:  Good  Psychomotor Activity:  Normal  Concentration:  Concentration: Good and Attention Span: Good  Recall:  Good  Fund of Knowledge: Good  Language: Good  Assets:  Desire for Improvement Financial Resources/Insurance Housing Transportation Vocational/Educational  ADL's:  Intact  Cognition: WNL  Prognosis:  Good   Gene site test results are under labs  dated 04/03/2021.  DIAGNOSES:    ICD-10-CM   1. Bipolar I disorder (HCC)  F31.9     2. Generalized anxiety disorder  F41.1     3. Insomnia due to other mental disorder  F51.05    F99     4. Restless legs  G25.81     5. Stress due to illness of family member  Z63.79       Receiving Psychotherapy: Yes    Gorge Laud   RECOMMENDATIONS:  PDMP was reviewed.  Last Xanax  filled 05/12/2023. I provided 20 minutes of non-face-to-face time during this encounter, including time spent before and after the visit in records review, medical decision making, counseling pertinent to today's visit, and charting.   Overall she is stable.  No changes need to be made.  Continue Xanax  1 mg, 1 po qid prn.  Continue Buspar  15 mg, to 2 po twice daily. Continue Auvelity  45-105 mg, 1 p.o. twice daily.   Continue hydroxyzine  to 50 mg, 1 p.o. every 6 hours as needed anxiety.    Continue Lamictal  200 mg, 2.5 pills daily. Continue Trileptal  300 mg, 3 nightly. Continue Zoloft  100 mg, 3 p.o. daily. Continue vitamins as per med list.  Add biotin daily. Continue counseling with Gorge Laud.  Return in 6 to 8 weeks.  Marvia Slocumb, PA-C

## 2023-06-15 ENCOUNTER — Other Ambulatory Visit: Payer: Self-pay | Admitting: Physician Assistant

## 2023-06-16 ENCOUNTER — Encounter: Payer: Self-pay | Admitting: Cardiology

## 2023-06-19 ENCOUNTER — Other Ambulatory Visit: Payer: Self-pay | Admitting: Oncology

## 2023-06-23 ENCOUNTER — Encounter: Payer: Self-pay | Admitting: Cardiology

## 2023-06-24 ENCOUNTER — Telehealth: Payer: Self-pay

## 2023-06-24 ENCOUNTER — Other Ambulatory Visit (HOSPITAL_COMMUNITY): Payer: Self-pay

## 2023-06-24 DIAGNOSIS — G4733 Obstructive sleep apnea (adult) (pediatric): Secondary | ICD-10-CM

## 2023-06-24 NOTE — Telephone Encounter (Signed)
 Call to patient to discuss her requests that labs be done in a more convenient location for her and that her ozempic  be refilled by Dr. Micael Adas as Dr. Ardell Beauvais had been doing.   Discussed with patient the following: Because chart indicates that patient has lost weight and OSA symptoms may be resolving, Dr. Micael Adas recommends that she do a home sleep study so we can see if she needs to continue sleep apnea treatment. Patient verbalizes understanding and agrees to plan.  Because patient has some documentation of elevated blood sugars and GI side effects while on Ozempic , Dr. Micael Adas recommends that she find a PCP or weight loss specialist who can manage Ozempic  dosing for her. Patient verbalizes understanding and agrees to plan.  Patient prefers to have PCP order/perform her cholesterol labs and will send results to our office. Depending on results of those labs Dr. Micael Adas will determine whether patient needs continued cardiology follow-up. Patient verbalizes understanding and agrees to plan.

## 2023-06-24 NOTE — Telephone Encounter (Signed)
 Darvin England, RN to [facility] [employer] [facility]     06/24/23 12:09 PM Hello Kristen [name] I will forward this to your provider that you would like to hold off on sleep study for now. Your PCP should have list of health and wellness. You'll probably need a referral. [facility] [place]. Hope this helps. Have a great day.

## 2023-06-26 ENCOUNTER — Ambulatory Visit: Admitting: Cardiology

## 2023-06-28 ENCOUNTER — Other Ambulatory Visit: Payer: Self-pay | Admitting: Physician Assistant

## 2023-06-29 ENCOUNTER — Other Ambulatory Visit: Payer: Self-pay | Admitting: Physician Assistant

## 2023-07-04 ENCOUNTER — Telehealth: Payer: Self-pay | Admitting: *Deleted

## 2023-07-04 NOTE — Telephone Encounter (Signed)
 I s/w the pt about setting up Itamar study. She stated to me that she had told Dr. Micael Adas and her nurse Ardelia Beau that she would call back when she is ready to schedule. I apologized for my call and I was not given that information. Pt said that was fine, no worries.   I will update all parties involved.

## 2023-07-04 NOTE — Telephone Encounter (Signed)
-----   Message from Nurse Ardelia Beau E sent at 06/24/2023 11:48 AM EDT ----- Regarding: Itamar home sleep study Hello team, Dr. Micael Adas has ordered a home sleep study on this patient. FYI patient lives in [place] and is hoping she can minimize trips into [place]. She has not been seen in the office in some time. Ardelia Beau, RN

## 2023-07-08 ENCOUNTER — Other Ambulatory Visit: Payer: Self-pay | Admitting: *Deleted

## 2023-07-08 ENCOUNTER — Encounter: Payer: Self-pay | Admitting: Oncology

## 2023-07-08 MED ORDER — BD DISP NEEDLES 22G X 1-1/2" MISC: 1.0000 | Freq: Once | 0 refills | Status: AC

## 2023-07-25 ENCOUNTER — Encounter: Payer: Self-pay | Admitting: Oncology

## 2023-07-25 NOTE — Progress Notes (Signed)
 Patient sent message through my chart feeling fatigued all the time even with taking B-12 injection.  Patient called back by nurse to suggest coming in for labs on Monday to check Iron  and Vitamin B-12

## 2023-07-28 ENCOUNTER — Inpatient Hospital Stay: Attending: Oncology

## 2023-07-28 ENCOUNTER — Other Ambulatory Visit: Payer: Self-pay | Admitting: Physician Assistant

## 2023-07-28 DIAGNOSIS — D75839 Thrombocytosis, unspecified: Secondary | ICD-10-CM | POA: Diagnosis present

## 2023-07-28 DIAGNOSIS — Z79899 Other long term (current) drug therapy: Secondary | ICD-10-CM | POA: Diagnosis not present

## 2023-07-28 DIAGNOSIS — D72829 Elevated white blood cell count, unspecified: Secondary | ICD-10-CM | POA: Insufficient documentation

## 2023-07-28 DIAGNOSIS — E538 Deficiency of other specified B group vitamins: Secondary | ICD-10-CM

## 2023-07-28 DIAGNOSIS — D649 Anemia, unspecified: Secondary | ICD-10-CM

## 2023-07-28 DIAGNOSIS — D509 Iron deficiency anemia, unspecified: Secondary | ICD-10-CM

## 2023-07-28 LAB — COMPREHENSIVE METABOLIC PANEL WITH GFR
ALT: 19 U/L (ref 0–44)
AST: 21 U/L (ref 15–41)
Albumin: 4.2 g/dL (ref 3.5–5.0)
Alkaline Phosphatase: 87 U/L (ref 38–126)
Anion gap: 11 (ref 5–15)
BUN: 19 mg/dL (ref 6–20)
CO2: 22 mmol/L (ref 22–32)
Calcium: 9.5 mg/dL (ref 8.9–10.3)
Chloride: 109 mmol/L (ref 98–111)
Creatinine, Ser: 0.83 mg/dL (ref 0.44–1.00)
GFR, Estimated: >60 mL/min (ref >60)
Glucose, Bld: 109 mg/dL — ABNORMAL HIGH (ref 70–99)
Potassium: 4.2 mmol/L (ref 3.5–5.1)
Sodium: 142 mmol/L (ref 135–145)
Total Bilirubin: 0.4 mg/dL (ref 0.0–1.2)
Total Protein: 7.1 g/dL (ref 6.5–8.1)

## 2023-07-28 LAB — CBC WITH DIFFERENTIAL/PLATELET
Abs Immature Granulocytes: 0.08 10*3/uL — ABNORMAL HIGH (ref 0.00–0.07)
Basophils Absolute: 0.1 10*3/uL (ref 0.0–0.1)
Basophils Relative: 1 %
Eosinophils Absolute: 0.3 10*3/uL (ref 0.0–0.5)
Eosinophils Relative: 4 %
HCT: 36.3 % (ref 36.0–46.0)
Hemoglobin: 11.7 g/dL — ABNORMAL LOW (ref 12.0–15.0)
Immature Granulocytes: 1 %
Lymphocytes Relative: 27 %
Lymphs Abs: 2.2 10*3/uL (ref 0.7–4.0)
MCH: 28.9 pg (ref 26.0–34.0)
MCHC: 32.2 g/dL (ref 30.0–36.0)
MCV: 89.6 fL (ref 80.0–100.0)
Monocytes Absolute: 0.8 10*3/uL (ref 0.1–1.0)
Monocytes Relative: 10 %
Neutro Abs: 4.7 10*3/uL (ref 1.7–7.7)
Neutrophils Relative %: 57 %
Platelets: 436 10*3/uL — ABNORMAL HIGH (ref 150–400)
RBC: 4.05 MIL/uL (ref 3.87–5.11)
RDW: 14.2 % (ref 11.5–15.5)
WBC: 8.3 10*3/uL (ref 4.0–10.5)
nRBC: 0 % (ref 0.0–0.2)

## 2023-07-28 LAB — IRON AND TIBC
Iron: 100 ug/dL (ref 28–170)
Saturation Ratios: 25 % (ref 10.4–31.8)
TIBC: 399 ug/dL (ref 250–450)
UIBC: 299 ug/dL

## 2023-07-28 LAB — FOLATE: Folate: 9.2 ng/mL (ref >5.9)

## 2023-07-28 LAB — FERRITIN: Ferritin: 514 ng/mL — ABNORMAL HIGH (ref 11–307)

## 2023-07-28 LAB — VITAMIN B12: Vitamin B-12: 1295 pg/mL — ABNORMAL HIGH (ref 180–914)

## 2023-07-30 ENCOUNTER — Encounter: Payer: Self-pay | Admitting: Oncology

## 2023-07-31 ENCOUNTER — Telehealth (INDEPENDENT_AMBULATORY_CARE_PROVIDER_SITE_OTHER): Admitting: Physician Assistant

## 2023-07-31 ENCOUNTER — Encounter: Payer: Self-pay | Admitting: Physician Assistant

## 2023-07-31 DIAGNOSIS — F5105 Insomnia due to other mental disorder: Secondary | ICD-10-CM | POA: Diagnosis not present

## 2023-07-31 DIAGNOSIS — G2581 Restless legs syndrome: Secondary | ICD-10-CM | POA: Diagnosis not present

## 2023-07-31 DIAGNOSIS — F319 Bipolar disorder, unspecified: Secondary | ICD-10-CM

## 2023-07-31 DIAGNOSIS — F411 Generalized anxiety disorder: Secondary | ICD-10-CM

## 2023-07-31 DIAGNOSIS — Z6379 Other stressful life events affecting family and household: Secondary | ICD-10-CM

## 2023-07-31 DIAGNOSIS — F99 Mental disorder, not otherwise specified: Secondary | ICD-10-CM

## 2023-07-31 DIAGNOSIS — Z79899 Other long term (current) drug therapy: Secondary | ICD-10-CM

## 2023-07-31 LAB — METHYLMALONIC ACID, SERUM: Methylmalonic Acid, Quantitative: 186 nmol/L (ref 0–378)

## 2023-07-31 MED ORDER — OXCARBAZEPINE 300 MG PO TABS: 600.0000 mg | ORAL_TABLET | Freq: Two times a day (BID) | ORAL | 0 refills | Status: DC

## 2023-07-31 NOTE — Progress Notes (Unsigned)
 [facility]  Patient ID: Kristen [name],  MRN: [id number]  PCP: Ira Mann  Date of Evaluation: 07/31/2023 Time spent:25 minutes  Chief Complaint:  Chief Complaint   Anxiety; Depression; Follow-up    Virtual Visit via Telehealth  I connected with patient by a video enabled telemedicine application with their informed consent, and verified patient privacy and that I am speaking with the correct person using two identifiers.  I am private, in my office and the patient is at home.  I discussed the limitations, risks, security and privacy concerns of performing an evaluation and management service by video and the availability of in person appointments. I also discussed with the patient that there may be a patient responsible charge related to this service. The patient expressed understanding and agreed to proceed.   I discussed the assessment and treatment plan with the patient. The patient was provided an opportunity to ask questions and all were answered. The patient agreed with the plan and demonstrated an understanding of the instructions.   The patient was advised to call back or seek an in-person evaluation if the symptoms worsen or if the condition fails to improve as anticipated.  I provided 25 minutes of non-face-to-face time during this encounter.  HISTORY/CURRENT STATUS: HPI For routine med check.  Feels like her meds are working as well as they can. Still under a lot of stress w/ illness of husband, issues with her step-dad and his kids since her Mom passed away, her son and his ex. She works part-time at [employer], behind Scientist, product/process development. It is an outlet for her and stress-reliever. She needs to get out of the house sometimes. Unable to work full-time d/t mental health. Tired a lot. Still seeing Hem, didn't need another iron  infusion.  No extreme sadness, tearfulness, or feelings of hopelessness.  Sleeps well most of the time. ADLs and personal hygiene  are normal.   Denies any changes in concentration, making decisions, or remembering things.  Appetite has not changed.  Weight is stable.  Still anxious but not having PA.  Xanax  helps. Denies suicidal or homicidal thoughts.  Since LOV, she has easily irritated, but partly circumstantial. Increased spending-bought 3 pair of tennis shoes but she needed them.  Bought expensive hair products and some clothes, also things from [employer] she didn't need. Bought a lot of self-help books that she hasn't read.  Talking more and having having racing thoughts.  No psychosis, delirium, or hallucinations.  Denies dizziness, syncope, seizures, numbness, tingling, tremor, tics, unsteady gait, slurred speech, confusion. Denies muscle or joint pain, stiffness, or dystonia.   Individual Medical History/ Review of Systems: Changes? :Yes       Past medications for mental health diagnoses include: Prozac , Paxil, Lexapro, Celexa, Zoloft , Viibryd , Effexor, Cymbalta , Risperdal , Latuda , Lamictal , VPA, Lithium -became toxic, Xanax , Wellbutrin , Abilify , Traz, Strattera, Kapvay, Saphris, Adderall, Vyvanse , Lunesta, Sonata ,  Vraylar, Seroquel , Geodon, Tegretal  Allergies: Iohexol , Rosuvastatin , and Diatrizoate  Current Medications:  Current Outpatient Medications:    acetaminophen  (TYLENOL ) 500 MG tablet, Take 1,000 mg by mouth every 6 (six) hours as needed for moderate pain., Disp: , Rfl:    ALPRAZolam  (XANAX ) 1 MG tablet, Take 1 tablet (1 mg total) by mouth 4 (four) times daily as needed for anxiety., Disp: 120 tablet, Rfl: 2   busPIRone  (BUSPAR ) 15 MG tablet, Take 2 tablets (30 mg total) by mouth 2 (two) times daily., Disp: 120 tablet, Rfl: 2   cetirizine  (ZYRTEC ) 10 MG tablet, Take  10 mg by mouth daily., Disp: , Rfl:    cyanocobalamin  (VITAMIN B12) 1000 MCG/ML injection, Inject 1 mL (1,000 mcg total) into the muscle once a week. 1 ml weekly X 4 and monthly X 6, Disp: 10 mL, Rfl: 3   Dextromethorphan-buPROPion  ER (AUVELITY )  45-105 MG TBCR, TAKE 1  BY MOUTH TWICE DAILY, Disp: 60 tablet, Rfl: 1   fenofibrate  (TRICOR ) 145 MG tablet, Take 1 tablet (145 mg total) by mouth daily., Disp: 30 tablet, Rfl: 1   fluticasone  (FLONASE ) 50 MCG/ACT nasal spray, Place 1 spray into both nostrils daily., Disp: , Rfl:    hydrOXYzine  (ATARAX ) 50 MG tablet, Take 1 tablet (50 mg total) by mouth every 6 (six) hours as needed., Disp: 120 tablet, Rfl: 5   lamoTRIgine  (LAMICTAL ) 200 MG tablet, TAKE 2 AND 1/2 (TWO AND ONE-HALF) TABLETS BY MOUTH ONCE DAILY, Disp: 225 tablet, Rfl: 0   levothyroxine  (SYNTHROID ) 75 MCG tablet, Take 75 mcg by mouth daily., Disp: , Rfl:    meclizine  (ANTIVERT ) 25 MG tablet, Take 25 mg by mouth 3 (three) times daily as needed for dizziness., Disp: , Rfl:    metFORMIN  (GLUCOPHAGE -XR) 750 MG 24 hr tablet, Take 750 mg by mouth daily., Disp: , Rfl:    ondansetron  (ZOFRAN ) 8 MG tablet, Take 1 tablet (8 mg total) by mouth every 8 (eight) hours as needed for nausea or vomiting., Disp: 90 tablet, Rfl: 2   ondansetron  (ZOFRAN -ODT) 8 MG disintegrating tablet, Take 1 tablet (8 mg total) by mouth 2 (two) times daily as needed for nausea or vomiting., Disp: 60 tablet, Rfl: 5   rosuvastatin  (CRESTOR ) 5 MG tablet, Take 1 tablet (5 mg total) by mouth daily., Disp: 30 tablet, Rfl: 1   Semaglutide ,0.25 or 0.5MG /DOS, 2 MG/3ML SOPN, Inject 0.25 mg into the skin once a week. Use 0.25 mg dose for 4 weeks then increase it to 0.5 mg once a week, Disp: 3 mL, Rfl: 1   sertraline  (ZOLOFT ) 100 MG tablet, TAKE 3 TABLETS BY MOUTH AT BEDTIME, Disp: 90 tablet, Rfl: 0   Tenapanor HCl (IBSRELA ) 50 MG TABS, Take 1 tablet by mouth 2 (two) times daily., Disp: 60 tablet, Rfl: 5   valACYclovir (VALTREX) 500 MG tablet, Take 500 mg by mouth daily., Disp: , Rfl:    Oxcarbazepine  (TRILEPTAL ) 300 MG tablet, Take 2 tablets (600 mg total) by mouth 2 (two) times daily., Disp: 360 tablet, Rfl: 0   predniSONE  (DELTASONE ) 10 MG tablet, Take 4 tablets (40 mg total) by  mouth daily with breakfast. (Patient not taking: Reported on 06/03/2023), Disp: 90 tablet, Rfl: 0   Semaglutide , 1 MG/DOSE, 4 MG/3ML SOPN, Inject 1 mg into the skin once a week. (Patient not taking: Reported on 07/31/2023), Disp: 3 mL, Rfl: 0 Medication Side Effects: Sweating  Family Medical/ Social History: Changes?  No  MENTAL HEALTH EXAM:  There were no vitals taken for this visit.There is no height or weight on file to calculate BMI.  General Appearance: Casual and Well Groomed  Eye Contact:  Good  Speech:  Clear and Coherent and Normal Rate  Volume:  Normal  Mood:  Euthymic  Affect:  Congruent  Thought Process:  Goal Directed and Descriptions of Associations: Circumstantial  Orientation:  Full (Time, Place, and Person)  Thought Content: Logical   Suicidal Thoughts:  No  Homicidal Thoughts:  No  Memory:  WNL  Judgement:  Good  Insight:  Good  Psychomotor Activity:  Normal  Concentration:  Concentration: Good and Attention  Span: Good  Recall:  Good  Fund of Knowledge: Good  Language: Good  Assets:  Desire for Improvement Financial Resources/Insurance Housing Transportation Vocational/Educational  ADL's:  Intact  Cognition: WNL  Prognosis:  Good   Gene site test results are under labs dated 04/03/2021.  DIAGNOSES:    ICD-10-CM   1. Bipolar I disorder (HCC)  F31.9     2. Generalized anxiety disorder  F41.1     3. Restless legs  G25.81     4. Insomnia due to other mental disorder  F51.05    F99     5. Stress due to illness of family member  Z63.79       Receiving Psychotherapy: Yes    Gorge Laud   RECOMMENDATIONS:  PDMP was reviewed.  Last Xanax  filled 07/14/2023. I provided approx 25 minutes of non-face-to-face time during this encounter, including time spent before and after the visit in records review, medical decision making, counseling pertinent to today's visit, and charting.      Continue Xanax  1 mg, 1 po qid prn.  Continue Buspar  15 mg, to 2 po  twice daily. Continue Auvelity  45-105 mg, 1 p.o. twice daily.   Continue hydroxyzine  to 50 mg, 1 p.o. every 6 hours as needed anxiety.   Continue Lamictal  200 mg, 2.5 pills daily. Continue Trileptal  300 mg, 3 nightly. Continue Zoloft  100 mg, 3 p.o. daily. Continue vitamins as per med list.  Add biotin daily. Continue counseling with Gorge Laud.  Return in 6 to 8 weeks.  Kristen Slocumb, PA-C

## 2023-08-13 ENCOUNTER — Telehealth: Payer: Self-pay | Admitting: Physician Assistant

## 2023-08-13 ENCOUNTER — Other Ambulatory Visit: Payer: Self-pay | Admitting: Physician Assistant

## 2023-08-13 NOTE — Telephone Encounter (Signed)
 Pharmacy said that even though new Rx is a dose increase it is the same medication and the insurance will not pay for it. Patient advised and recommend she call her insurance company.

## 2023-08-13 NOTE — Telephone Encounter (Signed)
 Pt called to make follow up appt at 10:10a.  She said she is having trouble getting her Oxycarbazepine.  Verneita increased it from 3 to 4 capsules and pharmacy says insurance won't pay because it too early.  Next appt 8/20

## 2023-08-14 ENCOUNTER — Other Ambulatory Visit: Payer: Self-pay

## 2023-08-14 DIAGNOSIS — D649 Anemia, unspecified: Secondary | ICD-10-CM

## 2023-08-14 DIAGNOSIS — D75839 Thrombocytosis, unspecified: Secondary | ICD-10-CM

## 2023-08-14 DIAGNOSIS — D509 Iron deficiency anemia, unspecified: Secondary | ICD-10-CM

## 2023-08-14 DIAGNOSIS — E538 Deficiency of other specified B group vitamins: Secondary | ICD-10-CM

## 2023-08-18 ENCOUNTER — Inpatient Hospital Stay: Attending: Oncology

## 2023-08-18 DIAGNOSIS — K5904 Chronic idiopathic constipation: Secondary | ICD-10-CM | POA: Insufficient documentation

## 2023-08-18 DIAGNOSIS — R519 Headache, unspecified: Secondary | ICD-10-CM | POA: Diagnosis not present

## 2023-08-18 DIAGNOSIS — D649 Anemia, unspecified: Secondary | ICD-10-CM | POA: Diagnosis not present

## 2023-08-18 DIAGNOSIS — Z87891 Personal history of nicotine dependence: Secondary | ICD-10-CM | POA: Diagnosis not present

## 2023-08-18 DIAGNOSIS — Z79624 Long term (current) use of inhibitors of nucleotide synthesis: Secondary | ICD-10-CM | POA: Diagnosis not present

## 2023-08-18 DIAGNOSIS — Z888 Allergy status to other drugs, medicaments and biological substances status: Secondary | ICD-10-CM | POA: Diagnosis not present

## 2023-08-18 DIAGNOSIS — E538 Deficiency of other specified B group vitamins: Secondary | ICD-10-CM

## 2023-08-18 DIAGNOSIS — K5730 Diverticulosis of large intestine without perforation or abscess without bleeding: Secondary | ICD-10-CM | POA: Insufficient documentation

## 2023-08-18 DIAGNOSIS — Z8261 Family history of arthritis: Secondary | ICD-10-CM | POA: Diagnosis not present

## 2023-08-18 DIAGNOSIS — Z83438 Family history of other disorder of lipoprotein metabolism and other lipidemia: Secondary | ICD-10-CM | POA: Diagnosis not present

## 2023-08-18 DIAGNOSIS — Z800 Family history of malignant neoplasm of digestive organs: Secondary | ICD-10-CM | POA: Diagnosis not present

## 2023-08-18 DIAGNOSIS — Z8349 Family history of other endocrine, nutritional and metabolic diseases: Secondary | ICD-10-CM | POA: Insufficient documentation

## 2023-08-18 DIAGNOSIS — Z8249 Family history of ischemic heart disease and other diseases of the circulatory system: Secondary | ICD-10-CM | POA: Insufficient documentation

## 2023-08-18 DIAGNOSIS — Z818 Family history of other mental and behavioral disorders: Secondary | ICD-10-CM | POA: Insufficient documentation

## 2023-08-18 DIAGNOSIS — K581 Irritable bowel syndrome with constipation: Secondary | ICD-10-CM | POA: Diagnosis not present

## 2023-08-18 DIAGNOSIS — D75839 Thrombocytosis, unspecified: Secondary | ICD-10-CM | POA: Insufficient documentation

## 2023-08-18 DIAGNOSIS — Z860101 Personal history of adenomatous and serrated colon polyps: Secondary | ICD-10-CM | POA: Insufficient documentation

## 2023-08-18 DIAGNOSIS — Z79899 Other long term (current) drug therapy: Secondary | ICD-10-CM | POA: Diagnosis not present

## 2023-08-18 DIAGNOSIS — Z833 Family history of diabetes mellitus: Secondary | ICD-10-CM | POA: Insufficient documentation

## 2023-08-18 DIAGNOSIS — Z807 Family history of other malignant neoplasms of lymphoid, hematopoietic and related tissues: Secondary | ICD-10-CM | POA: Insufficient documentation

## 2023-08-18 DIAGNOSIS — D72829 Elevated white blood cell count, unspecified: Secondary | ICD-10-CM | POA: Insufficient documentation

## 2023-08-18 DIAGNOSIS — Z825 Family history of asthma and other chronic lower respiratory diseases: Secondary | ICD-10-CM | POA: Diagnosis not present

## 2023-08-18 DIAGNOSIS — Z8379 Family history of other diseases of the digestive system: Secondary | ICD-10-CM | POA: Diagnosis not present

## 2023-08-18 DIAGNOSIS — Z8719 Personal history of other diseases of the digestive system: Secondary | ICD-10-CM | POA: Diagnosis not present

## 2023-08-18 DIAGNOSIS — Z7952 Long term (current) use of systemic steroids: Secondary | ICD-10-CM | POA: Insufficient documentation

## 2023-08-18 DIAGNOSIS — R5383 Other fatigue: Secondary | ICD-10-CM | POA: Insufficient documentation

## 2023-08-18 DIAGNOSIS — D509 Iron deficiency anemia, unspecified: Secondary | ICD-10-CM

## 2023-08-18 LAB — COMPREHENSIVE METABOLIC PANEL WITH GFR
ALT: 20 U/L (ref 0–44)
AST: 24 U/L (ref 15–41)
Albumin: 4.4 g/dL (ref 3.5–5.0)
Alkaline Phosphatase: 89 U/L (ref 38–126)
Anion gap: 9 (ref 5–15)
BUN: 23 mg/dL — ABNORMAL HIGH (ref 6–20)
CO2: 25 mmol/L (ref 22–32)
Calcium: 9.4 mg/dL (ref 8.9–10.3)
Chloride: 107 mmol/L (ref 98–111)
Creatinine, Ser: 0.94 mg/dL (ref 0.44–1.00)
GFR, Estimated: >60 mL/min (ref >60)
Glucose, Bld: 95 mg/dL (ref 70–99)
Potassium: 3.8 mmol/L (ref 3.5–5.1)
Sodium: 141 mmol/L (ref 135–145)
Total Bilirubin: 0.4 mg/dL (ref 0.0–1.2)
Total Protein: 7.3 g/dL (ref 6.5–8.1)

## 2023-08-18 LAB — CBC WITH DIFFERENTIAL/PLATELET
Abs Immature Granulocytes: 0.06 K/uL (ref 0.00–0.07)
Basophils Absolute: 0.1 K/uL (ref 0.0–0.1)
Basophils Relative: 1 %
Eosinophils Absolute: 0.3 K/uL (ref 0.0–0.5)
Eosinophils Relative: 4 %
HCT: 35.2 % — ABNORMAL LOW (ref 36.0–46.0)
Hemoglobin: 11.5 g/dL — ABNORMAL LOW (ref 12.0–15.0)
Immature Granulocytes: 1 %
Lymphocytes Relative: 20 %
Lymphs Abs: 1.7 K/uL (ref 0.7–4.0)
MCH: 29.3 pg (ref 26.0–34.0)
MCHC: 32.7 g/dL (ref 30.0–36.0)
MCV: 89.6 fL (ref 80.0–100.0)
Monocytes Absolute: 0.8 K/uL (ref 0.1–1.0)
Monocytes Relative: 9 %
Neutro Abs: 5.5 K/uL (ref 1.7–7.7)
Neutrophils Relative %: 65 %
Platelets: 386 K/uL (ref 150–400)
RBC: 3.93 MIL/uL (ref 3.87–5.11)
RDW: 14.1 % (ref 11.5–15.5)
WBC: 8.3 K/uL (ref 4.0–10.5)
nRBC: 0 % (ref 0.0–0.2)

## 2023-08-18 LAB — IRON AND TIBC
Iron: 88 ug/dL (ref 28–170)
Saturation Ratios: 21 % (ref 10.4–31.8)
TIBC: 416 ug/dL (ref 250–450)
UIBC: 328 ug/dL

## 2023-08-18 LAB — FOLATE: Folate: 11.7 ng/mL (ref >5.9)

## 2023-08-18 LAB — VITAMIN B12: Vitamin B-12: 1504 pg/mL — ABNORMAL HIGH (ref 180–914)

## 2023-08-18 LAB — FERRITIN: Ferritin: 497 ng/mL — ABNORMAL HIGH (ref 11–307)

## 2023-08-20 LAB — METHYLMALONIC ACID, SERUM: Methylmalonic Acid, Quantitative: 217 nmol/L (ref 0–378)

## 2023-08-21 ENCOUNTER — Ambulatory Visit: Admitting: Oncology

## 2023-08-22 ENCOUNTER — Ambulatory Visit: Payer: Self-pay | Admitting: Physician Assistant

## 2023-08-22 ENCOUNTER — Inpatient Hospital Stay (HOSPITAL_BASED_OUTPATIENT_CLINIC_OR_DEPARTMENT_OTHER): Admitting: Oncology

## 2023-08-22 VITALS — BP 117/77 | HR 75 | Temp 97.8°F | Resp 16 | Wt 198.0 lb

## 2023-08-22 DIAGNOSIS — D75839 Thrombocytosis, unspecified: Secondary | ICD-10-CM

## 2023-08-22 DIAGNOSIS — D509 Iron deficiency anemia, unspecified: Secondary | ICD-10-CM

## 2023-08-22 DIAGNOSIS — R1031 Right lower quadrant pain: Secondary | ICD-10-CM

## 2023-08-22 DIAGNOSIS — E538 Deficiency of other specified B group vitamins: Secondary | ICD-10-CM

## 2023-08-22 DIAGNOSIS — D649 Anemia, unspecified: Secondary | ICD-10-CM

## 2023-08-22 DIAGNOSIS — D72829 Elevated white blood cell count, unspecified: Secondary | ICD-10-CM | POA: Diagnosis not present

## 2023-08-22 LAB — OXCARBAZEPINE (TRILEPTAL), SERUM: Oxcarbazepine SerPl-Mcnc: 16 ug/mL (ref 10–35)

## 2023-08-22 NOTE — Progress Notes (Signed)
 Kristen [name] [address], [place] [address]   CLINIC:  Medical Oncology/Hematology  PCP:  Kristen [name] [address] [place] [address] [phone]   REASON FOR VISIT:  Follow-up for leukocytosis and thrombocytosis  PRIOR THERAPY: None  CURRENT THERAPY: Observation  INTERVAL HISTORY:  Kristen [name] 53 y.o. female returns for routine follow-up of leukocytosis.  She was last seen in our clinic on 05/14/23. .  She denies any interval hospitalizations, surgeries or changes to her baseline health.  She was seen by [facility] surgery by Dr. Teresa for chronic idiopathic constipation and diverticulitis.   She denies any episodes of rectal bleeding, bright red blood per rectum, melena or hematochezia.  Has been having diarrhea 10 episodes/day X 2-3 weeks. Having daily HA. Stopped taking baby aspirin  x 2-3 months which she thinks is helping her platelet counts.   Sleeping well. Feels bloated. Appetite varies depending on when and what she eats. Has some abdominal pain if she eats late at night. Has some severe pain right lower quadrant described as throbbing and stabbing that happens throughout the day and is pretty constant. No energy. No longer on Ozempic . Takes IBS medication as prescribed.   Not taking iron  supplements.  Last received IV iron  on 05/28/2023.  Tolerated well.  Reports it gave her additional energy for a few weeks.  She continues B12 injections at home.  Reports they help for about a week or so.  Feels so tired at times all she wants to do sleep.  When she gets home from work, she does not do her job as and does not move.  Reports her stress level low and her job is not very labor-intensive.  Reports she does have history of diverticulitis and last flare was in November 2023.  Typically her symptoms start with pain on the left lower quadrant not her right side.  She does have a gastroenterologist but does not see them back until  October.   REVIEW OF SYSTEMS:  Review of Systems  Constitutional:  Positive for appetite change and fatigue. Negative for fever.  Gastrointestinal:  Positive for abdominal distention, abdominal pain, blood in stool (d/t wiping), diarrhea and nausea. Negative for constipation.  Neurological:  Positive for headaches. Negative for dizziness.  Psychiatric/Behavioral:  The patient is nervous/anxious.     PAST MEDICAL/SURGICAL HISTORY:  Past Medical History:  Diagnosis Date   Allergies    Anemia    Anxiety    Arthritis    Atypical mole 11/03/2006   mid upper back (slight to moderate)   Atypical mole 11/03/2006   lower right back (slight to moderate)   Back pain    Bipolar 1 disorder (HCC)    Borderline diabetic    Chronic constipation    Depression    Diabetes (HCC)    Diverticulitis    Family history of adverse reaction to anesthesia    mother-- ponv   Fatty liver    GAD (generalized anxiety disorder)    GERD (gastroesophageal reflux disease)    Hiatal hernia    High triglycerides    History of kidney stones    History of recurrent UTIs    History of sepsis 06/2018   History of stomach ulcers    History of suicidal ideation    Hyperlipidemia    Hypothyroidism    IDA (iron  deficiency anemia)    Joint pain    Melanoma (HCC) 11/03/2006   left chest in situ (  excision)   Orthostasis    Palpitations    PONV (postoperative nausea and vomiting)    Rapid heart rate    Renal calculus, left    Seasonal allergies    Shortness of breath    Sleep disorder, unspecified    per excessive sleeping during the day   Snoring    Suicide ideation    Swallowing difficulty    Tachycardia    Vertigo    Vitamin D  deficiency    Weakness    Wears contact lenses    Past Surgical History:  Procedure Laterality Date   ANTERIOR CERVICAL DECOMP/DISCECTOMY FUSION  02/17/2012   Procedure: ANTERIOR CERVICAL DECOMPRESSION/DISCECTOMY FUSION 2 LEVELS;  Surgeon: Lamar LELON Peaches, MD;  Location:  [facility];  Service: Neurosurgery;  Laterality: N/A;  Cervical four-five and Cervical six-seven anterior cervial decompression with fusion plating and bonegraft   ANTERIOR CERVICAL DECOMP/DISCECTOMY FUSION  04-01-2001   @[facility]    C5---6   BIOPSY  07/20/2018   Procedure: BIOPSY;  Surgeon: Shaaron Lamar HERO, MD;  Location: [facility];  Service: Endoscopy;;  gastric    BREAST REDUCTION SURGERY Bilateral 1995   CESAREAN SECTION  x2   last one 1998   with BILATERAL TUBAL LIGATION   CESAREAN SECTION WITH BILATERAL TUBAL LIGATION     COLONOSCOPY WITH PROPOFOL  N/A 07/20/2018   Dr. Shaaron: Diverticulosis, internal hemorrhoids, 5 mm splenic flexure tubular adenoma removed. Repeat in 5 years due to family history.   COLONOSCOPY WITH PROPOFOL  N/A 12/07/2020   Procedure: COLONOSCOPY WITH PROPOFOL ;  Surgeon: Shaaron Lamar HERO, MD;  Location: [facility];  Service: Endoscopy;  Laterality: N/A;  2:30pm   CYSTOSCOPY/URETEROSCOPY/HOLMIUM LASER/STENT PLACEMENT Left 08/21/2018   Procedure: CYSTOSCOPY LEFT RETROGRADE PYELOGRAM /URETEROSCOPY/HOLMIUM LASER/STENT PLACEMENT;  Surgeon: Devere Lonni Righter, MD;  Location: [facility];  Service: Urology;  Laterality: Left;   DILATION AND CURETTAGE OF UTERUS  1992   for miscarriage   ENDOMETRIAL ABLATION  2014   ESOPHAGOGASTRODUODENOSCOPY     20 years ago.    ESOPHAGOGASTRODUODENOSCOPY (EGD) WITH PROPOFOL  N/A 07/20/2018   Dr. Shaaron: Small hiatal hernia, erythematous mucosa in the stomach with a healing gastric ulcer measuring 1 cm, biopsies negative.   ESOPHAGOGASTRODUODENOSCOPY (EGD) WITH PROPOFOL  N/A 10/05/2020   Procedure: ESOPHAGOGASTRODUODENOSCOPY (EGD) WITH PROPOFOL ;  Surgeon: Shaaron Lamar HERO, MD;  [facility];  Service: Endoscopy;  Laterality: N/A;   MALONEY DILATION N/A 10/05/2020   Procedure: AGAPITO DILATION;  Surgeon: Shaaron Lamar HERO, MD;  Location: [facility];  Service: Endoscopy;  Laterality: N/A;   MICROTUBOPLASTY  1998    unilateral for infertility   NASAL SINUS SURGERY  2010  approx.   POLYPECTOMY  07/20/2018   Procedure: POLYPECTOMY;  Surgeon: Shaaron Lamar HERO, MD;  Location: [facility];  Service: Endoscopy;;   POSTERIOR CERVICAL FUSION/FORAMINOTOMY N/A 08/18/2013   Procedure: CERVICAL FOUR TO CERVICAL SEVEN POSTERIOR CERVICAL FUSION/FORAMINOTOMY LEVEL 3;  Surgeon: Lamar LELON Peaches, MD;  Location: [facility];  Service: Neurosurgery;  Laterality: N/A;  C4-7 posterior cervical fusion with lateral mass fixation   RIGHT/LEFT HEART CATH AND CORONARY ANGIOGRAPHY N/A 12/23/2019   Procedure: RIGHT/LEFT HEART CATH AND CORONARY ANGIOGRAPHY;  Surgeon: Verlin Lonni BIRCH, MD;  Location: [facility];  Service: Cardiovascular;  Laterality: N/A;     SOCIAL HISTORY:  Social History   Socioeconomic History   Marital status: Married    Spouse name: Not on file   Number of children: 2  Years of education: Not on file   Highest education level: Not on file  Occupational History   Occupation: disability  Tobacco Use   Smoking status: Former    Current packs/day: 0.00    Average packs/day: 1 pack/day for 5.0 years (5.0 ttl pk-yrs)    Types: Cigarettes    Start date: 02/09/1997    Quit date: 02/09/2002    Years since quitting: 21.5    Passive exposure: Past   Smokeless tobacco: Never  Vaping Use   Vaping status: Never Used  Substance and Sexual Activity   Alcohol use: No   Drug use: No   Sexual activity: Yes    Partners: Male    Birth control/protection: Surgical    Comment: spouse  Other Topics Concern   Not on file  Social History Narrative   Step brother-2   Step sister-2   Half sister -1 healthy   Social Drivers of Health   Financial Resource Strain: Low Risk  (09/21/2022)   Received from [name]   Overall Financial Resource Strain (CARDIA)    Difficulty of Paying Living Expenses: Not hard at all  Food Insecurity: No Food Insecurity (09/21/2022)   Received from [facility]   Hunger Vital Sign    Within the past 12 months, you worried that your food would run out before you got the money to buy more.: Never true    Within the past 12 months, the food you bought just didn't last and you didn't have money to get more.: Never true  Transportation Needs: No Transportation Needs (09/21/2022)   Received from [facility]   PRAPARE - Transportation    Lack of Transportation (Medical): No    Lack of Transportation (Non-Medical): No  Physical Activity: Sufficiently Active (09/21/2022)   Received from [name]   Exercise Vital Sign    On average, how many days per week do you engage in moderate to strenuous exercise (like a brisk walk)?: 5 days    On average, how many minutes do you engage in exercise at this level?: 30 min  Stress: No Stress Concern Present (09/21/2022)   Received from [name] - Occupational Stress Questionnaire    Feeling of Stress : Not at all  Social Connections: Socially Integrated (09/21/2022)   Received from [facility]   Social Connection and Isolation Panel    In a typical week, how many times do you talk on the phone with family, friends, or neighbors?: More than three times a week    How often do you get together with friends or relatives?: Three times a week    How often do you attend church or religious services?: More than 4 times per year    Do you belong to any clubs or organizations such as church groups, unions, fraternal or athletic groups, or school groups?: Yes    How often do you attend meetings of the clubs or organizations you belong to?: 1 to 4 times per year    Are you married, widowed, divorced, separated, never married, or living with a partner?: Married  Intimate Partner Violence: Not At Risk (09/21/2022)   Received from [facility] [place]   Humiliation, Afraid, Rape, and Kick questionnaire    Within the last year, have you been afraid of your partner  or ex-partner?: No    Within the last year, have you been humiliated or emotionally abused in other  ways by your partner or ex-partner?: No    Within the last year, have you been kicked, hit, slapped, or otherwise physically hurt by your partner or ex-partner?: No    Within the last year, have you been raped or forced to have any kind of sexual activity by your partner or ex-partner?: No    FAMILY HISTORY:  Family History  Problem Relation Age of Onset   Asthma Mother    Rheum arthritis Mother    Non-Hodgkin's lymphoma Mother    Congestive Heart Failure Mother    Atrial fibrillation Mother    Diabetes Mother    Hyperlipidemia Mother    Cancer Mother    Colon cancer Father    Multiple myeloma Father    Hypertension Father    Cancer Father    Depression Father    Anxiety disorder Father    Alcoholism Father    Obesity Father    Diabetes Maternal Grandmother    Congestive Heart Failure Maternal Grandmother    Mental illness Paternal Grandmother    Colon cancer Paternal Grandfather    Bone cancer Paternal Grandfather    Allergies Other        entire family(mom and dad)   Liver disease Neg Hx     CURRENT MEDICATIONS:  Outpatient Encounter Medications as of 08/22/2023  Medication Sig   acetaminophen  (TYLENOL ) 500 MG tablet Take 1,000 mg by mouth every 6 (six) hours as needed for moderate pain.   ALPRAZolam  (XANAX ) 1 MG tablet Take 1 tablet (1 mg total) by mouth 4 (four) times daily as needed for anxiety.   AUVELITY  45-105 MG TBCR TAKE 1  BY MOUTH TWICE DAILY   busPIRone  (BUSPAR ) 15 MG tablet Take 2 tablets (30 mg total) by mouth 2 (two) times daily.   cetirizine  (ZYRTEC ) 10 MG tablet Take 10 mg by mouth daily.   cyanocobalamin  (VITAMIN B12) 1000 MCG/ML injection Inject 1 mL (1,000 mcg total) into the muscle once a week. 1 ml weekly X 4 and monthly X 6   fenofibrate  (TRICOR ) 145 MG tablet Take 1 tablet (145 mg total) by mouth daily.   fluticasone  (FLONASE ) 50 MCG/ACT nasal spray  Place 1 spray into both nostrils daily.   hydrOXYzine  (ATARAX ) 50 MG tablet Take 1 tablet (50 mg total) by mouth every 6 (six) hours as needed.   lamoTRIgine  (LAMICTAL ) 200 MG tablet TAKE 2 AND 1/2 (TWO AND ONE-HALF) TABLETS BY MOUTH ONCE DAILY   levothyroxine  (SYNTHROID ) 75 MCG tablet Take 75 mcg by mouth daily.   meclizine  (ANTIVERT ) 25 MG tablet Take 25 mg by mouth 3 (three) times daily as needed for dizziness.   metFORMIN  (GLUCOPHAGE -XR) 750 MG 24 hr tablet Take 750 mg by mouth daily.   ondansetron  (ZOFRAN ) 8 MG tablet Take 1 tablet (8 mg total) by mouth every 8 (eight) hours as needed for nausea or vomiting.   ondansetron  (ZOFRAN -ODT) 8 MG disintegrating tablet Take 1 tablet (8 mg total) by mouth 2 (two) times daily as needed for nausea or vomiting.   Oxcarbazepine  (TRILEPTAL ) 300 MG tablet Take 2 tablets (600 mg total) by mouth 2 (two) times daily.   predniSONE  (DELTASONE ) 10 MG tablet Take 4 tablets (40 mg total) by mouth daily with breakfast. (Patient not taking: Reported on 06/03/2023)   rosuvastatin  (CRESTOR ) 5 MG tablet Take 1 tablet (5 mg total) by mouth daily.   Semaglutide , 1 MG/DOSE, 4 MG/3ML SOPN Inject 1 mg into the skin once a week. (Patient not taking: Reported on  07/31/2023)   Semaglutide ,0.25 or 0.5MG /DOS, 2 MG/3ML SOPN Inject 0.25 mg into the skin once a week. Use 0.25 mg dose for 4 weeks then increase it to 0.5 mg once a week   sertraline  (ZOLOFT ) 100 MG tablet TAKE 3 TABLETS BY MOUTH AT BEDTIME   Tenapanor HCl (IBSRELA ) 50 MG TABS Take 1 tablet by mouth 2 (two) times daily.   valACYclovir (VALTREX) 500 MG tablet Take 500 mg by mouth daily.   No facility-administered encounter medications on file as of 08/22/2023.    ALLERGIES:  Allergies  Allergen Reactions   Iohexol  Hives     Desc: IVP DYE Also developed hives when drinking this contrast responded to benadryl .  Desc: IVP DYE  Desc: IVP DYE Also developed hives when drinking this contrast responded to benadryl .    Rosuvastatin  Other (See Comments)    Muscle aches with 10mg  and 20mg , tolerates 5mg  daily without issues Muscle aches with 20mg   Muscle aches with 10mg  and 20mg , tolerates 5mg  daily without issues    Diatrizoate Other (See Comments)     PHYSICAL EXAM:  ECOG PERFORMANCE STATUS: 1 - Symptomatic but completely ambulatory  There were no vitals filed for this visit. There were no vitals filed for this visit. Physical Exam Constitutional:      Appearance: Normal appearance. She is obese.  Cardiovascular:     Rate and Rhythm: Normal rate and regular rhythm.  Pulmonary:     Effort: Pulmonary effort is normal.     Breath sounds: Normal breath sounds.  Abdominal:     General: Bowel sounds are normal.     Palpations: Abdomen is soft.  Musculoskeletal:        General: No swelling. Normal range of motion.  Neurological:     Mental Status: She is alert and oriented to person, place, and time. Mental status is at baseline.      LABORATORY DATA:  I have reviewed the labs as listed.  CBC    Component Value Date/Time   WBC 8.3 08/18/2023 0938   RBC 3.93 08/18/2023 0938   HGB 11.5 (L) 08/18/2023 0938   HGB 12.0 06/06/2022 1029   HGB 12.9 12/17/2019 1052   HCT 35.2 (L) 08/18/2023 0938   HCT 39.0 12/17/2019 1052   PLT 386 08/18/2023 0938   PLT 445 (H) 06/06/2022 1029   PLT 425 12/17/2019 1052   MCV 89.6 08/18/2023 0938   MCV 86 12/17/2019 1052   MCH 29.3 08/18/2023 0938   MCHC 32.7 08/18/2023 0938   RDW 14.1 08/18/2023 0938   RDW 13.5 12/17/2019 1052   LYMPHSABS 1.7 08/18/2023 0938   LYMPHSABS 3.0 12/17/2019 1052   MONOABS 0.8 08/18/2023 0938   EOSABS 0.3 08/18/2023 0938   EOSABS 0.8 (H) 12/17/2019 1052   BASOSABS 0.1 08/18/2023 0938   BASOSABS 0.1 12/17/2019 1052      Latest Ref Rng & Units 08/18/2023    9:38 AM 07/28/2023    8:13 AM 05/12/2023   10:44 AM  CMP  Glucose 70 - 99 mg/dL 95  890  98   BUN 6 - 20 mg/dL 23  19  20    Creatinine 0.44 - 1.00 mg/dL 9.05  9.16   9.23   Sodium 135 - 145 mmol/L 141  142  138   Potassium 3.5 - 5.1 mmol/L 3.8  4.2  4.0   Chloride 98 - 111 mmol/L 107  109  103   CO2 22 - 32 mmol/L 25  22  25  Calcium  8.9 - 10.3 mg/dL 9.4  9.5  9.7   Total Protein 6.5 - 8.1 g/dL 7.3  7.1  7.6   Total Bilirubin 0.0 - 1.2 mg/dL 0.4  0.4  0.5   Alkaline Phos 38 - 126 U/L 89  87  98   AST 15 - 41 U/L 24  21  21    ALT 0 - 44 U/L 20  19  21      DIAGNOSTIC IMAGING:  I have independently reviewed the relevant imaging and discussed with the patient.  ASSESSMENT & PLAN: 1.  Neutrophilic leukocytosis and thrombocytosis - JAK2 CALR, and MPL testing was negative; negative BCR/ABL FISH testing; normal LDH, negative SPEP testing.  - History of intermittent neutrophilic leukocytosis since 2009, with WBC up to to 20.5 -Repeat JAK2 CALR and MPL testing was negative on 06/06/2022. - Ultrasound of the abdomen on 05/27/2019 showed probable fatty infiltration of the liver with minimal splenic enlargement measuring 12.1 cm in length with volume calculated to 502 mL.  - She continues to have diverticulitis flares most recently treated in November at [facility]. -She is followed by gastroenterology and her last colonoscopy was in October of 2022 which showed diverticulosis of the entire colon without evidence of diverticulitis.  No other abnormality. -She received 1 g IV iron  (INFeD ) on 10/29/2022 and 510 mg IV Feraheme on 05/28/2023 with good tolerance. -Has intermittent headaches and dizziness but otherwise no hyperviscosity symptoms.  Reports this is chronic for her. 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 -Given severe fatigue and anemia, recommend 1 dose of IV Feraheme 510 mg will push out to August.  Ferritin is 497, hemoglobin 11.5 with declining iron  saturations 21%.  2.  Family history: -Father had myeloma and paternal grandfather also had myeloma.  Mother had non-Hodgkin's  lymphoma.  Maternal uncle had non-Hodgkin's lymphoma.  Paternal aunt had colon cancer.  3.  Elevated B12 and folate levels: - She received 1 B12 injection in clinic on 11/29/2022 and self administers B12 monthly at home. -Repeat B12 levels from 08/18/2023 showed B12 of 1504 with MMA of 217.  Folate levels 11.7. -Continue B12 injections monthly at home.  4.  Bilateral lower extremity pain/cramping: - Reports this has improved some. -Previously given a prednisone  taper and Flexeril  which she uses intermittently.   5.  Neck and back pain: -Follow-up with pain clinic as needed-stable  6.  Dizziness: -Continue meclizine  and Zofran  for nausea-improving  7.  Anxiety: -She is followed by psychiatry. -Continue Xanax  1 mg, 1 po qid prn.  -Continue Wellbutrin  XL 150 mg, 3 p.o. every morning. -Continue Buspar  15 mg, to 2 po twice daily. -Continue hydroxyzine  to 50 mg, 1 p.o. every 6 hours as needed anxiety.   -Continue Lamictal  200 mg, 2.5 pills daily. -Continue Trileptal  300 mg, 3 nightly. -Continue Zoloft  100 mg, 3 p.o. daily. -Continue counseling with Rocky Andreas.   8.  Fatigue: - Multifactorial. -She received 1 dose of IV Feraheme on 05/28/23 and 1 g INFeD  back in August 2024. -We discussed although iron  levels have improved hemoglobin is low at 11.5 iron  saturations have dropped from past visits to 21%.  Plan on giving a 1 additional dose of IV Feraheme in the middle of August -Continue to monitor.  Return to clinic in 3 months.  9.  Abdominal pain: -She has been having right lower intense throbbing abdominal pain x 2 to 3 weeks. -Occasional nausea but no vomiting. -Has history  of diverticulitis mainly on the left side. -Last diverticulitis flare was in November 2023.  She had a CT scan without contrast due to contrast allergy. -Lets repeat her CT scan and get her back into see GI -Will call with results.   PLAN SUMMARY & DISPOSITION: PLAN SUMMARY: >> Recommend 1 dose of IV  Feraheme 510 mg August. >> Continue B12 injections at home monthly.   >> CT abdomen without contrast for right sided abdominal pain. >> Return to clinic in 4 months for lab work and see provider.     All questions were answered. The patient knows to call the clinic with any problems, questions or concerns.  Medical decision making: Moderate  Time spent on visit: I spent 25 minutes dedicated to the care of this patient (face-to-face and non-face-to-face) on the date of the encounter to include what is described in the assessment and plan.  Delon Hope, NP 08/22/2023 8:48 AM

## 2023-08-22 NOTE — Progress Notes (Signed)
 Nl Trileptal  level.  No change.

## 2023-08-25 ENCOUNTER — Ambulatory Visit (HOSPITAL_COMMUNITY)
Admission: RE | Admit: 2023-08-25 | Discharge: 2023-08-25 | Disposition: A | Discharge status: Home / Self Care | Source: Ambulatory Visit | Attending: Oncology | Admitting: Oncology

## 2023-08-25 DIAGNOSIS — R1031 Right lower quadrant pain: Secondary | ICD-10-CM | POA: Insufficient documentation

## 2023-08-26 ENCOUNTER — Encounter: Payer: Self-pay | Admitting: Oncology

## 2023-08-26 ENCOUNTER — Other Ambulatory Visit: Payer: Self-pay | Admitting: Gastroenterology

## 2023-08-26 DIAGNOSIS — K5732 Diverticulitis of large intestine without perforation or abscess without bleeding: Secondary | ICD-10-CM

## 2023-08-26 MED ORDER — METRONIDAZOLE 500 MG PO TABS: 500.0000 mg | ORAL_TABLET | Freq: Three times a day (TID) | ORAL | 0 refills | Status: DC

## 2023-08-26 MED ORDER — CIPROFLOXACIN HCL 500 MG PO TABS: 500.0000 mg | ORAL_TABLET | Freq: Two times a day (BID) | ORAL | 0 refills | Status: DC

## 2023-08-26 NOTE — Telephone Encounter (Signed)
 Is there anything you need for me to do to follow up on this?

## 2023-08-27 NOTE — Telephone Encounter (Signed)
 Hey, given she is symptomatic she probably needs to be treated for diverticulitis.  Please see if she wants me to call her in antibiotics or does she want to go see her gastroenterologist?  Delon Hope, NP 08/27/2023 8:07 AM

## 2023-09-01 ENCOUNTER — Other Ambulatory Visit: Payer: Self-pay | Admitting: Physician Assistant

## 2023-09-02 ENCOUNTER — Other Ambulatory Visit: Payer: Self-pay | Admitting: Gastroenterology

## 2023-09-02 ENCOUNTER — Ambulatory Visit: Admitting: Gastroenterology

## 2023-09-02 NOTE — Progress Notes (Deleted)
 Kristen [name] [id number] 1970-09-21   Chief Complaint: Diverticulitis  Referring Provider: Nena Rosina CROME, PA-C Primary GI MD: Dr. Leigh  HPI: Kristen [name] is a 53 y.o. female with past medical history of sigmoid diverticulitis, duodenal diverticulitis, chronic constipation, C. difficile, GERD, renal stones, bipolar disorder, melanoma, anxiety who presents today for follow-up of recent diverticulitis.    Patient last seen in office 12/04/2022 by Vina Dasen, NP for complaint of nausea, constipation, recent recurrence of diverticulitis, and constant LLQ pain.  At that time acute pain had resolved after course of antibiotics but patient noted to never have complete relief of chronic LLQ pain.  She failed or did not tolerate MiraLAX  or Linzess  for chronic constipation, and Ibsrela  which initially worked was no longer working though she has never taken the full dose and was only taking once daily.  Her chronic nausea was thought secondary to Ozempic .  Patient has family history of colon cancer in her father at age 40 and onset at age 71.  Paternal grandmother also had colon cancer.  Last colonoscopy October 2022.  Patient follows with hematology for neutrophilic leukocytosis, thrombocytosis, iron  deficiency anemia, vitamin B12 deficiency.  At last visit with hematology 08/22/2023 patient endorsed right lower quadrant abdominal pain and CT was ordered given her history of diverticulitis.  Labs 08/18/2023: Hemoglobin 11.5, hematocrit 35.2, MCV 89.6, no leukocytosis, unremarkable CMP with normal liver function, iron  88, ferritin 497, vitamin B12 > 1000, normal folate  CT scan 08/25/2023 showed likely diverticulitis, and Dr. Leigh prescribed 5-day course of Cipro  and Flagyl .   At visit with Dr. Leigh 03/13/2022 chronic constipation and recurrent diverticulitis were discussed at length.  She has been treated now at least 7 times with antibiotics for diverticulitis in the last  few years, and it was discussed that she may want to consider surgical resection to prevent recurrence in the future.  She does have history of C. difficile and need to be mindful of this and treating diverticulitis with antibiotics.  She was advised regarding she has side effects of Ozempic  possibly making her symptoms worse.  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   Previous GI Procedures/Imaging   CT A/P 08/25/2023 1. Duodenal and total colonic diverticulosis. Mesenteric stranding along the mid to distal sigmoid colon (axial 68). While this could reflect changes from epiploic appendagitis, it is worrisome for acute, uncomplicated diverticulitis. No peridiverticular abscess or pneumoperitoneum. 2. Bilateral nonobstructive nephrolithiasis.  No hydronephrosis. Aortic Atherosclerosis (ICD10-I70.0).  Colonoscopy 12/07/2020 - Diverticulosis in the entire examined colon.  - The examination was otherwise normal on direct and retroflexion views.  - No specimens collected. - Recall 7 years  Past Medical History:  Diagnosis Date   Allergies    Anemia    Anxiety    Arthritis    Atypical mole 11/03/2006   mid upper back (slight to moderate)   Atypical mole 11/03/2006   lower right back (slight to moderate)   Back pain    Bipolar 1 disorder (HCC)    Borderline diabetic    Chronic constipation    Depression    Diabetes (HCC)    Diverticulitis    Family history of adverse reaction to anesthesia    mother-- ponv   Fatty liver    GAD (generalized anxiety disorder)    GERD (gastroesophageal reflux disease)    Hiatal hernia    High triglycerides    History of kidney stones  History of recurrent UTIs    History of sepsis 06/2018   History of stomach ulcers    History of suicidal ideation    Hyperlipidemia    Hypothyroidism    IDA (iron  deficiency anemia)    Joint pain    Melanoma (HCC) 11/03/2006   left chest in situ (excision)    Orthostasis    Palpitations    PONV (postoperative nausea and vomiting)    Rapid heart rate    Renal calculus, left    Seasonal allergies    Shortness of breath    Sleep disorder, unspecified    per excessive sleeping during the day   Snoring    Suicide ideation    Swallowing difficulty    Tachycardia    Vertigo    Vitamin D  deficiency    Weakness    Wears contact lenses     Past Surgical History:  Procedure Laterality Date   ANTERIOR CERVICAL DECOMP/DISCECTOMY FUSION  02/17/2012   Procedure: ANTERIOR CERVICAL DECOMPRESSION/DISCECTOMY FUSION 2 LEVELS;  Surgeon: Lamar LELON Peaches, MD;  Location: [facility];  Service: Neurosurgery;  Laterality: N/A;  Cervical four-five and Cervical six-seven anterior cervial decompression with fusion plating and bonegraft   ANTERIOR CERVICAL DECOMP/DISCECTOMY FUSION  04-01-2001   @[facility]    C5---6   BIOPSY  07/20/2018   Procedure: BIOPSY;  Surgeon: Shaaron Lamar HERO, MD;  Location: [facility];  Service: Endoscopy;;  gastric    BREAST REDUCTION SURGERY Bilateral 1995   CESAREAN SECTION  x2   last one 1998   with BILATERAL TUBAL LIGATION   CESAREAN SECTION WITH BILATERAL TUBAL LIGATION     COLONOSCOPY WITH PROPOFOL  N/A 07/20/2018   Dr. Shaaron: Diverticulosis, internal hemorrhoids, 5 mm splenic flexure tubular adenoma removed. Repeat in 5 years due to family history.   COLONOSCOPY WITH PROPOFOL  N/A 12/07/2020   Procedure: COLONOSCOPY WITH PROPOFOL ;  Surgeon: Shaaron Lamar HERO, MD;  Location: [facility];  Service: Endoscopy;  Laterality: N/A;  2:30pm   CYSTOSCOPY/URETEROSCOPY/HOLMIUM LASER/STENT PLACEMENT Left 08/21/2018   Procedure: CYSTOSCOPY LEFT RETROGRADE PYELOGRAM /URETEROSCOPY/HOLMIUM LASER/STENT PLACEMENT;  Surgeon: Devere Lonni Righter, MD;  Location: [facility];  Service: Urology;  Laterality: Left;   DILATION AND CURETTAGE OF UTERUS  1992   for miscarriage   ENDOMETRIAL ABLATION  2014   ESOPHAGOGASTRODUODENOSCOPY      20 years ago.    ESOPHAGOGASTRODUODENOSCOPY (EGD) WITH PROPOFOL  N/A 07/20/2018   Dr. Shaaron: Small hiatal hernia, erythematous mucosa in the stomach with a healing gastric ulcer measuring 1 cm, biopsies negative.   ESOPHAGOGASTRODUODENOSCOPY (EGD) WITH PROPOFOL  N/A 10/05/2020   Procedure: ESOPHAGOGASTRODUODENOSCOPY (EGD) WITH PROPOFOL ;  Surgeon: Shaaron Lamar HERO, MD;  Location: [facility];  Service: Endoscopy;  Laterality: N/A;   MALONEY DILATION N/A 10/05/2020   Procedure: AGAPITO DILATION;  Surgeon: Shaaron Lamar HERO, MD;  Location: [facility];  Service: Endoscopy;  Laterality: N/A;   MICROTUBOPLASTY  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   POLYPECTOMY  07/20/2018   Procedure: POLYPECTOMY;  Surgeon: Shaaron Lamar HERO, MD;  Location: [facility];  Service: Endoscopy;;   POSTERIOR CERVICAL FUSION/FORAMINOTOMY N/A 08/18/2013   Procedure: CERVICAL FOUR TO CERVICAL SEVEN POSTERIOR CERVICAL FUSION/FORAMINOTOMY LEVEL 3;  Surgeon: Lamar LELON Peaches, MD;  Location: [facility];  Service: Neurosurgery;  Laterality: N/A;  C4-7 posterior cervical fusion with lateral mass fixation   RIGHT/LEFT HEART CATH AND CORONARY ANGIOGRAPHY N/A 12/23/2019   Procedure: RIGHT/LEFT  HEART CATH AND CORONARY ANGIOGRAPHY;  Surgeon: Verlin Lonni BIRCH, MD;  Location: [facility];  Service: Cardiovascular;  Laterality: N/A;    Current Outpatient Medications  Medication Sig Dispense Refill   acetaminophen  (TYLENOL ) 500 MG tablet Take 1,000 mg by mouth every 6 (six) hours as needed for moderate pain.     ALPRAZolam  (XANAX ) 1 MG tablet Take 1 tablet (1 mg total) by mouth 4 (four) times daily as needed for anxiety. 120 tablet 2   AUVELITY  45-105 MG TBCR TAKE 1  BY MOUTH TWICE DAILY 60 tablet 0   busPIRone  (BUSPAR ) 15 MG tablet Take 2 tablets (30 mg total) by mouth 2 (two) times daily. 120 tablet 2   cetirizine  (ZYRTEC ) 10 MG tablet Take 10 mg by mouth daily.     ciprofloxacin   (CIPRO ) 500 MG tablet Take 1 tablet (500 mg total) by mouth 2 (two) times daily. 10 tablet 0   cyanocobalamin  (VITAMIN B12) 1000 MCG/ML injection Inject 1 mL (1,000 mcg total) into the muscle once a week. 1 ml weekly X 4 and monthly X 6 10 mL 3   fenofibrate  (TRICOR ) 145 MG tablet Take 1 tablet (145 mg total) by mouth daily. 30 tablet 1   fluticasone  (FLONASE ) 50 MCG/ACT nasal spray Place 1 spray into both nostrils daily.     hydrOXYzine  (ATARAX ) 50 MG tablet Take 1 tablet (50 mg total) by mouth every 6 (six) hours as needed. 120 tablet 5   lamoTRIgine  (LAMICTAL ) 200 MG tablet TAKE 2 AND 1/2 (TWO AND ONE-HALF) TABLETS BY MOUTH ONCE DAILY 225 tablet 0   levothyroxine  (SYNTHROID ) 75 MCG tablet Take 75 mcg by mouth daily.     meclizine  (ANTIVERT ) 25 MG tablet Take 25 mg by mouth 3 (three) times daily as needed for dizziness.     metFORMIN  (GLUCOPHAGE -XR) 750 MG 24 hr tablet Take 750 mg by mouth daily.     metroNIDAZOLE  (FLAGYL ) 500 MG tablet Take 1 tablet (500 mg total) by mouth 3 (three) times daily. 15 tablet 0   ondansetron  (ZOFRAN ) 8 MG tablet Take 1 tablet (8 mg total) by mouth every 8 (eight) hours as needed for nausea or vomiting. 90 tablet 2   ondansetron  (ZOFRAN -ODT) 8 MG disintegrating tablet Take 1 tablet (8 mg total) by mouth 2 (two) times daily as needed for nausea or vomiting. 60 tablet 5   Oxcarbazepine  (TRILEPTAL ) 300 MG tablet Take 2 tablets (600 mg total) by mouth 2 (two) times daily. (Patient taking differently: Take 600 mg by mouth 2 (two) times daily. Take one in the morning and three at night) 360 tablet 0   rosuvastatin  (CRESTOR ) 5 MG tablet Take 1 tablet (5 mg total) by mouth daily. 30 tablet 1   Semaglutide ,0.25 or 0.5MG /DOS, 2 MG/3ML SOPN Inject 0.25 mg into the skin once a week. Use 0.25 mg dose for 4 weeks then increase it to 0.5 mg once a week 3 mL 1   sertraline  (ZOLOFT ) 100 MG tablet TAKE 3 TABLETS BY MOUTH AT BEDTIME 90 tablet 1   Tenapanor HCl (IBSRELA ) 50 MG TABS Take 1  tablet by mouth 2 (two) times daily. 60 tablet 5   valACYclovir (VALTREX) 500 MG tablet Take 500 mg by mouth daily.     No current facility-administered medications for this visit.    Allergies as of 09/02/2023 - Review Complete 08/22/2023  Allergen Reaction Noted   Iohexol  Hives 11/03/2007   Rosuvastatin  Other (See Comments) 02/09/2020   Diatrizoate Other (See Comments) 02/16/2021  Family History  Problem Relation Age of Onset   Asthma Mother    Rheum arthritis Mother    Non-Hodgkin's lymphoma Mother    Congestive Heart Failure Mother    Atrial fibrillation Mother    Diabetes Mother    Hyperlipidemia Mother    Cancer Mother    Colon cancer Father    Multiple myeloma Father    Hypertension Father    Cancer Father    Depression Father    Anxiety disorder Father    Alcoholism Father    Obesity Father    Diabetes Maternal Grandmother    Congestive Heart Failure Maternal Grandmother    Mental illness Paternal Grandmother    Colon cancer Paternal Grandfather    Bone cancer Paternal Grandfather    Allergies Other        entire family(mom and dad)   Liver disease Neg Hx     Social History   Tobacco Use   Smoking status: Former    Current packs/day: 0.00    Average packs/day: 1 pack/day for 5.0 years (5.0 ttl pk-yrs)    Types: Cigarettes    Start date: 02/09/1997    Quit date: 02/09/2002    Years since quitting: 21.5    Passive exposure: Past   Smokeless tobacco: Never  Vaping Use   Vaping status: Never Used  Substance Use Topics   Alcohol use: No   Drug use: No     Review of Systems:    Constitutional: No weight loss, fever, chills, weakness or fatigue Eyes: No change in vision Ears, Nose, Throat:  No change in hearing or congestion Skin: No rash or itching Cardiovascular: No chest pain, chest pressure or palpitations   Respiratory: No SOB or cough Gastrointestinal: See HPI and otherwise negative Genitourinary: No dysuria or change in urinary  frequency Neurological: No headache, dizziness or syncope Musculoskeletal: No new muscle or joint pain Hematologic: No bleeding or bruising    Physical Exam:  Vital signs: There were no vitals taken for this visit.  Constitutional: NAD, Well developed, Well nourished, alert and cooperative Head:  Normocephalic and atraumatic.  Eyes: No scleral icterus. Conjunctiva pink. Mouth: No oral lesions. Respiratory: Respirations even and unlabored. Lungs clear to auscultation bilaterally.  No wheezes, crackles, or rhonchi.  Cardiovascular:  Regular rate and rhythm. No murmurs. No peripheral edema. Gastrointestinal:  Soft, nondistended, nontender. No rebound or guarding. Normal bowel sounds. No appreciable masses or hepatomegaly. Rectal:  Not performed.  Neurologic:  Alert and oriented x4;  grossly normal neurologically.  Skin:   Dry and intact without significant lesions or rashes. Psychiatric: Oriented to person, place and time. Demonstrates good judgement and reason without abnormal affect or behaviors.   RELEVANT LABS AND IMAGING: CBC    Component Value Date/Time   WBC 8.3 08/18/2023 0938   RBC 3.93 08/18/2023 0938   HGB 11.5 (L) 08/18/2023 0938   HGB 12.0 06/06/2022 1029   HGB 12.9 12/17/2019 1052   HCT 35.2 (L) 08/18/2023 0938   HCT 39.0 12/17/2019 1052   PLT 386 08/18/2023 0938   PLT 445 (H) 06/06/2022 1029   PLT 425 12/17/2019 1052   MCV 89.6 08/18/2023 0938   MCV 86 12/17/2019 1052   MCH 29.3 08/18/2023 0938   MCHC 32.7 08/18/2023 0938   RDW 14.1 08/18/2023 0938   RDW 13.5 12/17/2019 1052   LYMPHSABS 1.7 08/18/2023 0938   LYMPHSABS 3.0 12/17/2019 1052   MONOABS 0.8 08/18/2023 0938   EOSABS 0.3 08/18/2023 9061  EOSABS 0.8 (H) 12/17/2019 1052   BASOSABS 0.1 08/18/2023 0938   BASOSABS 0.1 12/17/2019 1052    CMP     Component Value Date/Time   NA 141 08/18/2023 0938   NA 137 12/17/2019 1052   K 3.8 08/18/2023 0938   CL 107 08/18/2023 0938   CO2 25 08/18/2023 0938    GLUCOSE 95 08/18/2023 0938   BUN 23 (H) 08/18/2023 0938   BUN 19 12/17/2019 1052   CREATININE 0.94 08/18/2023 0938   CREATININE 1.05 (H) 06/06/2022 1029   CREATININE 1.09 (H) 08/02/2020 1049   CALCIUM  9.4 08/18/2023 0938   CALCIUM  9.6 06/10/2019 1539   PROT 7.3 08/18/2023 0938   PROT 7.1 05/12/2020 1117   ALBUMIN 4.4 08/18/2023 0938   ALBUMIN 4.6 05/12/2020 1117   AST 24 08/18/2023 0938   AST 24 06/06/2022 1029   ALT 20 08/18/2023 0938   ALT 17 06/06/2022 1029   ALKPHOS 89 08/18/2023 0938   BILITOT 0.4 08/18/2023 0938   BILITOT 0.4 06/06/2022 1029   GFRNONAA >60 08/18/2023 0938   GFRNONAA >60 06/06/2022 1029   GFRNONAA 59 (L) 08/02/2020 1049   GFRAA 69 08/02/2020 1049     Assessment/Plan:   Diverticulitis  Abdominal pain Consider updating colonoscopy given family history of colon cancer (discuss further with Dr. Leigh) Repeat CBC, CMP Consider surgical referral   Camie Furbish, PA-C [facility] 09/02/2023, 11:40 AM  Patient Care Team: Nena Rosina LITTIE DEVONNA as PCP - General (Physician Assistant) Wonda Cy BROCKS, RD as Dietitian (Family Medicine) Shaaron Lamar HERO, MD as Consulting Physician (Gastroenterology)

## 2023-09-03 ENCOUNTER — Other Ambulatory Visit: Payer: Self-pay | Admitting: Physician Assistant

## 2023-09-07 NOTE — Progress Notes (Unsigned)
 Ellouise Console, PA-C [address] [place], [place]  [address] Phone: [phone]   Primary Care Physician: Nena Rosina CROME, [place]  Primary Gastroenterologist:  Ellouise Console, PA-C / Elspeth Naval, MD   Chief Complaint: Follow-up recurrent diverticulitis       HPI:   Kristen [name] is a 53 y.o. female, established patient Dr. Naval, presents for follow-up of RLQ pain, diarrhea, and recurrent diverticulitis.  Patient had 6 episodes of diverticulitis between 2023 and 2024.  She has also had multiple recurrent episodes of diverticulitis in 2025 treated with antibiotics.  She had surgical evaluation by Dr. Teresa at [facility] surgery.    Patient has history of chronic lifelong constipation.  Currently on semaglutide  to help with weight loss.  No current constipation treatment.  08/25/2023 CT abdomen and pelvis without contrast: 1. Duodenal and total colonic diverticulosis. Mesenteric stranding along the mid to distal sigmoid colon (axial 68). While this could reflect changes from epiploic appendagitis, it is worrisome for acute, uncomplicated diverticulitis. No peridiverticular abscess or pneumoperitoneum. 2. Bilateral nonobstructive nephrolithiasis.  No hydronephrosis.  08/26/2023: She was started on Cipro  500 twice daily and metronidazole  500 3 times daily for 5 days to treat diverticulitis.  Current symptoms:  Patient finished all of the Cipro  and Flagyl  antibiotic.  She is having severe abdominal pain, gas, and bloating.  She takes Zofran  as needed nausea.  11/2020 last colonoscopy by Dr. Shaaron at [facility]: Pandiverticulosis, otherwise normal.  No polyps.  Adequate prep.  No biopsies.  7-year repeat due to previous history of adenomatous colon polyp.  09/2020 last EGD by Dr. Shaaron (for dysphagia): Normal.  Underwent empiric esophageal dilation with [facility] dilator to 56 FR.  07/2018 colonoscopy: 1 small 5 mm tubular adenoma polyp removed.  Current  Outpatient Medications  Medication Sig Dispense Refill   acetaminophen  (TYLENOL ) 500 MG tablet Take 1,000 mg by mouth every 6 (six) hours as needed for moderate pain.     ALPRAZolam  (XANAX ) 1 MG tablet Take 1 tablet (1 mg total) by mouth 4 (four) times daily as needed for anxiety. 120 tablet 2   AUVELITY  45-105 MG TBCR TAKE 1  BY MOUTH TWICE DAILY 60 tablet 0   busPIRone  (BUSPAR ) 15 MG tablet Take 2 tablets by mouth twice daily 120 tablet 0   cetirizine  (ZYRTEC ) 10 MG tablet Take 10 mg by mouth daily.     ciprofloxacin  (CIPRO ) 500 MG tablet Take 1 tablet (500 mg total) by mouth 2 (two) times daily. 10 tablet 0   cyanocobalamin  (VITAMIN B12) 1000 MCG/ML injection Inject 1 mL (1,000 mcg total) into the muscle once a week. 1 ml weekly X 4 and monthly X 6 10 mL 3   fenofibrate  (TRICOR ) 145 MG tablet Take 1 tablet (145 mg total) by mouth daily. 30 tablet 1   fluticasone  (FLONASE ) 50 MCG/ACT nasal spray Place 1 spray into both nostrils daily.     hydrOXYzine  (ATARAX ) 50 MG tablet Take 1 tablet (50 mg total) by mouth every 6 (six) hours as needed. 120 tablet 5   lamoTRIgine  (LAMICTAL ) 200 MG tablet TAKE 2 AND 1/2 (TWO AND ONE-HALF) TABLETS BY MOUTH ONCE DAILY 225 tablet 0   levothyroxine  (SYNTHROID ) 75 MCG tablet Take 75 mcg by mouth daily.     meclizine  (ANTIVERT ) 25 MG tablet Take 25 mg by mouth 3 (three) times daily as needed for dizziness.     metFORMIN  (GLUCOPHAGE -XR) 750 MG 24 hr tablet Take 750 mg by  mouth daily.     metroNIDAZOLE  (FLAGYL ) 500 MG tablet Take 1 tablet (500 mg total) by mouth 3 (three) times daily. 15 tablet 0   ondansetron  (ZOFRAN ) 8 MG tablet Take 1 tablet (8 mg total) by mouth every 8 (eight) hours as needed for nausea or vomiting. 90 tablet 2   ondansetron  (ZOFRAN -ODT) 8 MG disintegrating tablet Take 1 tablet (8 mg total) by mouth 2 (two) times daily as needed for nausea or vomiting. 60 tablet 5   Oxcarbazepine  (TRILEPTAL ) 300 MG tablet Take 2 tablets (600 mg total) by mouth 2  (two) times daily. (Patient taking differently: Take 600 mg by mouth 2 (two) times daily. Take one in the morning and three at night) 360 tablet 0   rosuvastatin  (CRESTOR ) 5 MG tablet Take 1 tablet (5 mg total) by mouth daily. 30 tablet 1   Semaglutide ,0.25 or 0.5MG /DOS, 2 MG/3ML SOPN Inject 0.25 mg into the skin once a week. Use 0.25 mg dose for 4 weeks then increase it to 0.5 mg once a week 3 mL 1   sertraline  (ZOLOFT ) 100 MG tablet TAKE 3 TABLETS BY MOUTH AT BEDTIME 90 tablet 1   Tenapanor HCl (IBSRELA ) 50 MG TABS Take 1 tablet by mouth 2 (two) times daily. Please keep your July appointment for further refills. Thank you. 60 tablet 0   valACYclovir (VALTREX) 500 MG tablet Take 500 mg by mouth daily.     No current facility-administered medications for this visit.    Allergies as of 09/08/2023 - Review Complete 08/22/2023  Allergen Reaction Noted   Iohexol  Hives 11/03/2007   Rosuvastatin  Other (See Comments) 02/09/2020   Diatrizoate Other (See Comments) 02/16/2021    Past Medical History:  Diagnosis Date   Allergies    Anemia    Anxiety    Arthritis    Atypical mole 11/03/2006   mid upper back (slight to moderate)   Atypical mole 11/03/2006   lower right back (slight to moderate)   Back pain    Bipolar 1 disorder (HCC)    Borderline diabetic    Chronic constipation    Depression    Diabetes (HCC)    Diverticulitis    Family history of adverse reaction to anesthesia    mother-- ponv   Fatty liver    GAD (generalized anxiety disorder)    GERD (gastroesophageal reflux disease)    Hiatal hernia    High triglycerides    History of kidney stones    History of recurrent UTIs    History of sepsis 06/2018   History of stomach ulcers    History of suicidal ideation    Hyperlipidemia    Hypothyroidism    IDA (iron  deficiency anemia)    Joint pain    Melanoma (HCC) 11/03/2006   left chest in situ (excision)   Orthostasis    Palpitations    PONV (postoperative nausea and  vomiting)    Rapid heart rate    Renal calculus, left    Seasonal allergies    Shortness of breath    Sleep disorder, unspecified    per excessive sleeping during the day   Snoring    Suicide ideation    Swallowing difficulty    Tachycardia    Vertigo    Vitamin D  deficiency    Weakness    Wears contact lenses     Past Surgical History:  Procedure Laterality Date   ANTERIOR CERVICAL DECOMP/DISCECTOMY FUSION  02/17/2012   Procedure: ANTERIOR CERVICAL DECOMPRESSION/DISCECTOMY FUSION 2  LEVELS;  Surgeon: Lamar LELON Peaches, MD;  Location: [facility];  Service: Neurosurgery;  Laterality: N/A;  Cervical four-five and Cervical six-seven anterior cervial decompression with fusion plating and bonegraft   ANTERIOR CERVICAL DECOMP/DISCECTOMY FUSION  04-01-2001   @[facility]    C5---6   BIOPSY  07/20/2018   Procedure: BIOPSY;  Surgeon: Shaaron Lamar HERO, MD;  Location: [facility];  Service: Endoscopy;;  gastric    BREAST REDUCTION SURGERY Bilateral 1995   CESAREAN SECTION  x2   last one 1998   with BILATERAL TUBAL LIGATION   CESAREAN SECTION WITH BILATERAL TUBAL LIGATION     COLONOSCOPY WITH PROPOFOL  N/A 07/20/2018   Dr. Shaaron: Diverticulosis, internal hemorrhoids, 5 mm splenic flexure tubular adenoma removed. Repeat in 5 years due to family history.   COLONOSCOPY WITH PROPOFOL  N/A 12/07/2020   Procedure: COLONOSCOPY WITH PROPOFOL ;  Surgeon: Shaaron Lamar HERO, MD;  Location: [facility];  Service: Endoscopy;  Laterality: N/A;  2:30pm   CYSTOSCOPY/URETEROSCOPY/HOLMIUM LASER/STENT PLACEMENT Left 08/21/2018   Procedure: CYSTOSCOPY LEFT RETROGRADE PYELOGRAM /URETEROSCOPY/HOLMIUM LASER/STENT PLACEMENT;  Surgeon: Devere Lonni Righter, MD;  Location: [facility];  Service: Urology;  Laterality: Left;   DILATION AND CURETTAGE OF UTERUS  1992   for miscarriage   ENDOMETRIAL ABLATION  2014   ESOPHAGOGASTRODUODENOSCOPY     20 years ago.    ESOPHAGOGASTRODUODENOSCOPY (EGD) WITH PROPOFOL   N/A 07/20/2018   Dr. Shaaron: Small hiatal hernia, erythematous mucosa in the stomach with a healing gastric ulcer measuring 1 cm, biopsies negative.   ESOPHAGOGASTRODUODENOSCOPY (EGD) WITH PROPOFOL  N/A 10/05/2020   Procedure: ESOPHAGOGASTRODUODENOSCOPY (EGD) WITH PROPOFOL ;  Surgeon: Shaaron Lamar HERO, MD;  Location: [facility];  Service: Endoscopy;  Laterality: N/A;   MALONEY DILATION N/A 10/05/2020   Procedure: AGAPITO DILATION;  Surgeon: Shaaron Lamar HERO, MD;  Location: [facility];  Service: Endoscopy;  Laterality: N/A;   MICROTUBOPLASTY  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   POLYPECTOMY  07/20/2018   Procedure: POLYPECTOMY;  Surgeon: Shaaron Lamar HERO, MD;  Location: [facility];  Service: Endoscopy;;   POSTERIOR CERVICAL FUSION/FORAMINOTOMY N/A 08/18/2013   Procedure: CERVICAL FOUR TO CERVICAL SEVEN POSTERIOR CERVICAL FUSION/FORAMINOTOMY LEVEL 3;  Surgeon: Lamar LELON Peaches, MD;  Location: [facility];  Service: Neurosurgery;  Laterality: N/A;  C4-7 posterior cervical fusion with lateral mass fixation   RIGHT/LEFT HEART CATH AND CORONARY ANGIOGRAPHY N/A 12/23/2019   Procedure: RIGHT/LEFT HEART CATH AND CORONARY ANGIOGRAPHY;  Surgeon: Verlin Lonni BIRCH, MD;  Location: [facility];  Service: Cardiovascular;  Laterality: N/A;    Review of Systems:    All systems reviewed and negative except where noted in HPI.    Physical Exam:  There were no vitals taken for this visit. No LMP recorded. Patient has had an ablation.  General: Well-nourished, well-developed in no acute distress.  Lungs: Clear to auscultation bilaterally. Non-labored. Heart: Regular rate and rhythm, no murmurs rubs or gallops.  Abdomen: Bowel sounds are normal; Abdomen is Soft; No hepatosplenomegaly, masses or hernias;  No Abdominal Tenderness; No guarding or rebound tenderness. Neuro: Alert and oriented x 3.  Grossly intact.  Psych: Alert and cooperative, normal mood and  affect.   Imaging Studies: CT ABDOMEN PELVIS WO CONTRAST Result Date: 08/25/2023 CLINICAL DATA:  Diarrhea, abdominal pain RLQ EXAM: CT ABDOMEN AND PELVIS WITHOUT CONTRAST TECHNIQUE: Multidetector CT imaging of the abdomen and pelvis was performed following the standard protocol without IV contrast.  RADIATION DOSE REDUCTION: This exam was performed according to the departmental dose-optimization program which includes automated exposure control, adjustment of the mA and/or kV according to patient size and/or use of iterative reconstruction technique. COMPARISON:  December 18, 2021 FINDINGS: Of note, the lack of intravenous contrast limits evaluation of the solid organ parenchyma and vascularity. Lower chest: No focal airspace consolidation or pleural effusion. Hepatobiliary: No mass.No radiopaque stones or wall thickening of the gallbladder. No intrahepatic or extrahepatic biliary ductal dilation. Pancreas: No mass or main ductal dilation. No peripancreatic inflammation or fluid collection. Spleen: Normal size. No mass. Adrenals/Urinary Tract: No adrenal masses. No renal mass. Small nonobstructive bilateral calyceal calculi. The largest calculus measures 9 mm in the left lower pole. The urinary bladder is distended without focal abnormality. Stomach/Bowel: The stomach contains ingested material without focal abnormality. 2 cm periampullary duodenal diverticulum. Smaller diverticulum in the fourth portion of the duodenal. No small bowel wall thickening or inflammation. No small bowel obstruction.No extravasation of enteric contrast to suggest bowel perforation.normal appendix. Total colonic diverticulosis. Mesenteric stranding along the mid to distal sigmoid colon (axial 68). Vascular/Lymphatic: No aortic aneurysm. Scattered aortoiliac atherosclerosis. No intraabdominal or pelvic lymphadenopathy. Reproductive: Lobular contour of the uterus, possibly due to uterine fibroids. No concerning adnexal mass. No free  pelvic fluid. Other: No pneumoperitoneum, ascites, or mesenteric inflammation. Musculoskeletal: No acute fracture or destructive lesion. Multilevel degenerative disc disease of the spine. IMPRESSION: 1. Duodenal and total colonic diverticulosis. Mesenteric stranding along the mid to distal sigmoid colon (axial 68). While this could reflect changes from epiploic appendagitis, it is worrisome for acute, uncomplicated diverticulitis. No peridiverticular abscess or pneumoperitoneum. 2. Bilateral nonobstructive nephrolithiasis.  No hydronephrosis. Aortic Atherosclerosis (ICD10-I70.0). Electronically Signed   By: Rogelia Myers M.D.   On: 08/25/2023 15:50    Labs: CBC    Component Value Date/Time   WBC 8.3 08/18/2023 0938   RBC 3.93 08/18/2023 0938   HGB 11.5 (L) 08/18/2023 0938   HGB 12.0 06/06/2022 1029   HGB 12.9 12/17/2019 1052   HCT 35.2 (L) 08/18/2023 0938   HCT 39.0 12/17/2019 1052   PLT 386 08/18/2023 0938   PLT 445 (H) 06/06/2022 1029   PLT 425 12/17/2019 1052   MCV 89.6 08/18/2023 0938   MCV 86 12/17/2019 1052   MCH 29.3 08/18/2023 0938   MCHC 32.7 08/18/2023 0938   RDW 14.1 08/18/2023 0938   RDW 13.5 12/17/2019 1052   LYMPHSABS 1.7 08/18/2023 0938   LYMPHSABS 3.0 12/17/2019 1052   MONOABS 0.8 08/18/2023 0938   EOSABS 0.3 08/18/2023 0938   EOSABS 0.8 (H) 12/17/2019 1052   BASOSABS 0.1 08/18/2023 0938   BASOSABS 0.1 12/17/2019 1052    CMP     Component Value Date/Time   NA 141 08/18/2023 0938   NA 137 12/17/2019 1052   K 3.8 08/18/2023 0938   CL 107 08/18/2023 0938   CO2 25 08/18/2023 0938   GLUCOSE 95 08/18/2023 0938   BUN 23 (H) 08/18/2023 0938   BUN 19 12/17/2019 1052   CREATININE 0.94 08/18/2023 0938   CREATININE 1.05 (H) 06/06/2022 1029   CREATININE 1.09 (H) 08/02/2020 1049   CALCIUM  9.4 08/18/2023 0938   CALCIUM  9.6 06/10/2019 1539   PROT 7.3 08/18/2023 0938   PROT 7.1 05/12/2020 1117   ALBUMIN 4.4 08/18/2023 0938   ALBUMIN 4.6 05/12/2020 1117   AST 24  08/18/2023 0938   AST 24 06/06/2022 1029   ALT 20 08/18/2023 0938   ALT 17 06/06/2022 1029  ALKPHOS 89 08/18/2023 0938   BILITOT 0.4 08/18/2023 0938   BILITOT 0.4 06/06/2022 1029   GFRNONAA >60 08/18/2023 0938   GFRNONAA >60 06/06/2022 1029   GFRNONAA 59 (L) 08/02/2020 1049   GFRAA 69 08/02/2020 1049       Assessment and Plan:   SAMEERA BETTON is a 53 y.o. y/o female   1.  Recurrent diverticulitis: No abscess or perforation  2.  Pandiverticulosis  3.  Chronic lifelong constipation - Start Linzess   4.  Colon cancer screening is up-to-date - Last negative colonoscopy was 11/2020. - 7-year repeat colonoscopy will be due 11/2027.    Ellouise Console, PA-C  Follow up ***

## 2023-09-08 ENCOUNTER — Other Ambulatory Visit: Payer: Self-pay | Admitting: Physician Assistant

## 2023-09-08 ENCOUNTER — Encounter: Payer: Self-pay | Admitting: Physician Assistant

## 2023-09-08 ENCOUNTER — Ambulatory Visit: Admitting: Physician Assistant

## 2023-09-08 VITALS — BP 124/80 | HR 73 | Ht 66.0 in | Wt 202.2 lb

## 2023-09-08 DIAGNOSIS — K5909 Other constipation: Secondary | ICD-10-CM | POA: Diagnosis not present

## 2023-09-08 DIAGNOSIS — K5732 Diverticulitis of large intestine without perforation or abscess without bleeding: Secondary | ICD-10-CM | POA: Diagnosis not present

## 2023-09-08 MED ORDER — AMOXICILLIN-POT CLAVULANATE 875-125 MG PO TABS
1.0000 | ORAL_TABLET | Freq: Two times a day (BID) | ORAL | 0 refills | Status: AC
Start: 2023-09-08 — End: 2023-09-15

## 2023-09-08 MED ORDER — IBSRELA 50 MG PO TABS: 1.0000 | ORAL_TABLET | Freq: Two times a day (BID) | ORAL | 11 refills | Status: DC

## 2023-09-08 NOTE — Patient Instructions (Addendum)
 Start OTC Florastor Probiotic 250mg  1 capsule twice daily x 10 days.   We have sent the following medications to your pharmacy for you to pick up at your convenience: Augmentin  twice daily for 7 days and IBSrella 50 mg twice daily   Please follow up sooner if symptoms increase or worsen  Due to recent changes in healthcare laws, you may see the results of your imaging and laboratory studies on MyChart before your provider has had a chance to review them.  We understand that in some cases there may be results that are confusing or concerning to you. Not all laboratory results come back in the same time frame and the provider may be waiting for multiple results in order to interpret others.  Please give us  48 hours in order for your provider to thoroughly review all the results before contacting the office for clarification of your results.   Thank you for trusting me with your gastrointestinal care!   Ellouise Console, PA-C _______________________________________________________  If your blood pressure at your visit was 140/90 or greater, please contact your primary care physician to follow up on this.  _______________________________________________________  If you are age 23 or older, your body mass index should be between 23-30. Your Body mass index is 32.64 kg/m. If this is out of the aforementioned range listed, please consider follow up with your Primary Care Provider.  If you are age 26 or younger, your body mass index should be between 19-25. Your Body mass index is 32.64 kg/m. If this is out of the aformentioned range listed, please consider follow up with your Primary Care Provider.   ________________________________________________________  The [facility] providers would like to encourage you to use MYCHART to communicate with providers for non-urgent requests or questions.  Due to long hold times on the telephone, sending your provider a message by [facility] may be a faster and more  efficient way to get a response.  Please allow 48 business hours for a response.  Please remember that this is for non-urgent requests.  _______________________________________________________

## 2023-09-08 NOTE — Progress Notes (Signed)
 Agree with assessment and plan as outlined.

## 2023-09-13 ENCOUNTER — Other Ambulatory Visit: Payer: Self-pay | Admitting: Physician Assistant

## 2023-09-15 ENCOUNTER — Other Ambulatory Visit: Payer: Self-pay | Admitting: *Deleted

## 2023-09-15 ENCOUNTER — Encounter: Payer: Self-pay | Admitting: Oncology

## 2023-09-15 MED ORDER — LUER LOCK SAFETY SYRINGES 23G X 1" 3 ML MISC: 1 refills | Status: AC

## 2023-09-19 ENCOUNTER — Inpatient Hospital Stay: Attending: Oncology

## 2023-09-19 VITALS — BP 107/58 | HR 57 | Temp 98.2°F | Resp 16

## 2023-09-19 DIAGNOSIS — R109 Unspecified abdominal pain: Secondary | ICD-10-CM | POA: Diagnosis not present

## 2023-09-19 DIAGNOSIS — R197 Diarrhea, unspecified: Secondary | ICD-10-CM | POA: Diagnosis not present

## 2023-09-19 DIAGNOSIS — Z83438 Family history of other disorder of lipoprotein metabolism and other lipidemia: Secondary | ICD-10-CM | POA: Insufficient documentation

## 2023-09-19 DIAGNOSIS — R140 Abdominal distension (gaseous): Secondary | ICD-10-CM | POA: Diagnosis not present

## 2023-09-19 DIAGNOSIS — D72829 Elevated white blood cell count, unspecified: Secondary | ICD-10-CM | POA: Diagnosis present

## 2023-09-19 DIAGNOSIS — Z808 Family history of malignant neoplasm of other organs or systems: Secondary | ICD-10-CM | POA: Diagnosis not present

## 2023-09-19 DIAGNOSIS — Z818 Family history of other mental and behavioral disorders: Secondary | ICD-10-CM | POA: Diagnosis not present

## 2023-09-19 DIAGNOSIS — Z825 Family history of asthma and other chronic lower respiratory diseases: Secondary | ICD-10-CM | POA: Diagnosis not present

## 2023-09-19 DIAGNOSIS — Z807 Family history of other malignant neoplasms of lymphoid, hematopoietic and related tissues: Secondary | ICD-10-CM | POA: Diagnosis not present

## 2023-09-19 DIAGNOSIS — R110 Nausea: Secondary | ICD-10-CM | POA: Insufficient documentation

## 2023-09-19 DIAGNOSIS — Z79899 Other long term (current) drug therapy: Secondary | ICD-10-CM | POA: Diagnosis not present

## 2023-09-19 DIAGNOSIS — Z800 Family history of malignant neoplasm of digestive organs: Secondary | ICD-10-CM | POA: Diagnosis not present

## 2023-09-19 DIAGNOSIS — Z833 Family history of diabetes mellitus: Secondary | ICD-10-CM | POA: Insufficient documentation

## 2023-09-19 DIAGNOSIS — Z809 Family history of malignant neoplasm, unspecified: Secondary | ICD-10-CM | POA: Insufficient documentation

## 2023-09-19 DIAGNOSIS — Z8261 Family history of arthritis: Secondary | ICD-10-CM | POA: Insufficient documentation

## 2023-09-19 DIAGNOSIS — D75839 Thrombocytosis, unspecified: Secondary | ICD-10-CM | POA: Diagnosis present

## 2023-09-19 DIAGNOSIS — R519 Headache, unspecified: Secondary | ICD-10-CM | POA: Insufficient documentation

## 2023-09-19 DIAGNOSIS — Z8349 Family history of other endocrine, nutritional and metabolic diseases: Secondary | ICD-10-CM | POA: Insufficient documentation

## 2023-09-19 DIAGNOSIS — R5383 Other fatigue: Secondary | ICD-10-CM | POA: Insufficient documentation

## 2023-09-19 DIAGNOSIS — F419 Anxiety disorder, unspecified: Secondary | ICD-10-CM | POA: Insufficient documentation

## 2023-09-19 DIAGNOSIS — D509 Iron deficiency anemia, unspecified: Secondary | ICD-10-CM

## 2023-09-19 MED ORDER — SODIUM CHLORIDE 0.9 % IV SOLN: INTRAVENOUS | Status: DC

## 2023-09-19 MED ORDER — SODIUM CHLORIDE 0.9 % IV SOLN
510.0000 mg | Freq: Once | INTRAVENOUS | Status: AC
  Administered 2023-09-19: 510 mg via INTRAVENOUS
  Filled 2023-09-19: qty 510

## 2023-09-19 MED ORDER — ACETAMINOPHEN 325 MG PO TABS
650.0000 mg | ORAL_TABLET | Freq: Once | ORAL | Status: AC
Start: 2023-09-19 — End: 2023-09-19
  Administered 2023-09-19: 650 mg via ORAL
  Filled 2023-09-19: qty 2

## 2023-09-19 NOTE — Progress Notes (Signed)
 Patient tolerated iron infusion with no complaints voiced.  Peripheral IV site clean and dry with good blood return noted before and after infusion.  Band aid applied.  VSS with discharge and left in satisfactory condition with no s/s of distress noted.

## 2023-09-19 NOTE — Patient Instructions (Signed)

## 2023-09-19 NOTE — Progress Notes (Signed)
 Patient is here today for her iron  infusion.  She took her Zyrtec  this morning at home but did not take any tylenol .  We will give that to her today before her treatment.

## 2023-09-28 ENCOUNTER — Other Ambulatory Visit: Payer: Self-pay | Admitting: Physician Assistant

## 2023-10-01 ENCOUNTER — Telehealth: Admitting: Physician Assistant

## 2023-10-01 ENCOUNTER — Other Ambulatory Visit: Payer: Self-pay | Admitting: Physician Assistant

## 2023-10-06 ENCOUNTER — Other Ambulatory Visit: Payer: Self-pay | Admitting: Physician Assistant

## 2023-10-06 ENCOUNTER — Encounter: Payer: Self-pay | Admitting: Physician Assistant

## 2023-10-06 ENCOUNTER — Telehealth: Admitting: Physician Assistant

## 2023-10-06 DIAGNOSIS — G4733 Obstructive sleep apnea (adult) (pediatric): Secondary | ICD-10-CM

## 2023-10-06 DIAGNOSIS — F5105 Insomnia due to other mental disorder: Secondary | ICD-10-CM

## 2023-10-06 DIAGNOSIS — G2581 Restless legs syndrome: Secondary | ICD-10-CM | POA: Diagnosis not present

## 2023-10-06 DIAGNOSIS — F99 Mental disorder, not otherwise specified: Secondary | ICD-10-CM

## 2023-10-06 DIAGNOSIS — F4323 Adjustment disorder with mixed anxiety and depressed mood: Secondary | ICD-10-CM | POA: Diagnosis not present

## 2023-10-06 DIAGNOSIS — F319 Bipolar disorder, unspecified: Secondary | ICD-10-CM | POA: Diagnosis not present

## 2023-10-06 DIAGNOSIS — Z6379 Other stressful life events affecting family and household: Secondary | ICD-10-CM

## 2023-10-06 MED ORDER — BUSPIRONE HCL 15 MG PO TABS: 30.0000 mg | ORAL_TABLET | Freq: Two times a day (BID) | ORAL | 3 refills | Status: AC

## 2023-10-06 MED ORDER — AUVELITY 45-105 MG PO TBCR: 1.0000 | EXTENDED_RELEASE_TABLET | Freq: Two times a day (BID) | ORAL | 3 refills | Status: AC

## 2023-10-06 MED ORDER — ALPRAZOLAM 1 MG PO TABS: 1.0000 mg | ORAL_TABLET | Freq: Four times a day (QID) | ORAL | 5 refills | Status: DC | PRN

## 2023-10-06 NOTE — Progress Notes (Signed)
 [facility]  Patient ID: Kristen [name],  MRN: [id number]  PCP: Nena Rosina LITTIE DEVONNA  Date of Evaluation: 10/06/2023 Time spent:25 minutes  Chief Complaint:  Chief Complaint   Anxiety; Depression; Follow-up    Virtual Visit via Telehealth  I connected with patient by a video enabled telemedicine application with their informed consent, and verified patient privacy and that I am speaking with the correct person using two identifiers.  I am private, in my office and the patient is at home.  I discussed the limitations, risks, security and privacy concerns of performing an evaluation and management service by video and the availability of in person appointments. I also discussed with the patient that there may be a patient responsible charge related to this service. The patient expressed understanding and agreed to proceed.   I discussed the assessment and treatment plan with the patient. The patient was provided an opportunity to ask questions and all were answered. The patient agreed with the plan and demonstrated an understanding of the instructions.   The patient was advised to call back or seek an in-person evaluation if the symptoms worsen or if the condition fails to improve as anticipated.  I provided 25 minutes of non-face-to-face time during this encounter.  HISTORY/CURRENT STATUS: HPI For routine med check.  Under a lot of stress.  See Social history.  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  Needs the Xanax  daily. If she doesn't take it, she feels panicky.  Not having PA, just overwhelmed.  Energy and motivation are fair. Is sad b/c she can't see her granddaughter.  Sleeps ok. ADLs and personal hygiene are normal.   Denies any changes in concentration, making decisions, or remembering things.  Appetite has not changed.  Weight is stable.  Hasn't been in counselling b/c her therapists won't call her back.  To be honest, I get more out of talking to  the [place].  I read my Bible and do my devotions and journal.  All those things are helpful.  No SI/HI.  No reports of increased energy with decreased need for sleep, increased talkativeness, racing thoughts, impulsivity or risky behaviors, increased spending, increased libido, grandiosity, increased irritability or anger, paranoia, or hallucinations.  Denies dizziness, syncope, seizures, numbness, tingling, tremor, tics, unsteady gait, slurred speech, confusion. Denies muscle or joint pain, stiffness, or dystonia. Denies unexplained weight loss, frequent infections, or sores that heal slowly.  No polyphagia, polydipsia, or polyuria. Denies visual changes or paresthesias.   Individual Medical History/ Review of Systems: Changes? :Yes    having diverticulitis frequently.  It's been recommended that she have colectomy but she doesn't want to.  Past medications for mental health diagnoses include: Prozac , Paxil, Lexapro, Celexa, Zoloft , Viibryd , Effexor, Cymbalta , Risperdal , Latuda , Lamictal , VPA, Lithium -became toxic, Xanax , Wellbutrin , Abilify , Traz, Strattera, Kapvay, Saphris, Adderall, Vyvanse , Lunesta, Sonata ,  Vraylar, Seroquel , Geodon, Tegretal  Allergies: Iohexol , Rosuvastatin , and Diatrizoate  Current Medications:  Current Outpatient Medications:    acetaminophen  (TYLENOL ) 500 MG tablet, Take 1,000 mg by mouth every 6 (six) hours as needed for moderate pain., Disp: , Rfl:    cetirizine  (ZYRTEC ) 10 MG tablet, Take 10 mg by mouth daily., Disp: , Rfl:    cyanocobalamin  (VITAMIN B12) 1000 MCG/ML injection, Inject 1 mL (1,000 mcg total) into the muscle once a week. 1 ml weekly X 4 and monthly X 6, Disp: 10 mL, Rfl: 3   fenofibrate  (TRICOR ) 145 MG tablet, Take 1 tablet (145 mg  total) by mouth daily., Disp: 30 tablet, Rfl: 1   fluticasone  (FLONASE ) 50 MCG/ACT nasal spray, Place 1 spray into both nostrils daily., Disp: , Rfl:    hydrOXYzine  (ATARAX ) 50 MG tablet, TAKE 1 TABLET BY MOUTH EVERY 6 HOURS  AS NEEDED, Disp: 120 tablet, Rfl: 0   lamoTRIgine  (LAMICTAL ) 200 MG tablet, TAKE 2 & 1/2 (TWO & ONE-HALF) TABLETS BY MOUTH ONCE DAILY, Disp: 225 tablet, Rfl: 0   levothyroxine  (SYNTHROID ) 75 MCG tablet, Take 75 mcg by mouth daily., Disp: , Rfl:    metFORMIN  (GLUCOPHAGE -XR) 750 MG 24 hr tablet, Take 750 mg by mouth daily., Disp: , Rfl:    ondansetron  (ZOFRAN ) 8 MG tablet, Take 1 tablet (8 mg total) by mouth every 8 (eight) hours as needed for nausea or vomiting., Disp: 90 tablet, Rfl: 2   ondansetron  (ZOFRAN -ODT) 8 MG disintegrating tablet, Take 1 tablet (8 mg total) by mouth 2 (two) times daily as needed for nausea or vomiting., Disp: 60 tablet, Rfl: 5   Oxcarbazepine  (TRILEPTAL ) 300 MG tablet, Take 2 tablets (600 mg total) by mouth 2 (two) times daily. (Patient taking differently: Take 600 mg by mouth 2 (two) times daily. 1 po q am, 3 po qhs), Disp: 360 tablet, Rfl: 0   rosuvastatin  (CRESTOR ) 5 MG tablet, Take 1 tablet (5 mg total) by mouth daily., Disp: 30 tablet, Rfl: 1   sertraline  (ZOLOFT ) 100 MG tablet, TAKE 3 TABLETS BY MOUTH AT BEDTIME, Disp: 90 tablet, Rfl: 0   SYRINGE-NEEDLE, DISP, 3 ML (LUER LOCK SAFETY SYRINGES) 23G X 1 3 ML MISC, To be used with B 12 injections, Disp: 12 each, Rfl: 1   Tenapanor HCl (IBSRELA ) 50 MG TABS, Take 1 tablet by mouth 2 (two) times daily. Please keep your July appointment for further refills. Thank you., Disp: 60 tablet, Rfl: 11   valACYclovir (VALTREX) 500 MG tablet, Take 500 mg by mouth daily., Disp: , Rfl:    ALPRAZolam  (XANAX ) 1 MG tablet, Take 1 tablet (1 mg total) by mouth every 6 (six) hours as needed for anxiety., Disp: 120 tablet, Rfl: 5   busPIRone  (BUSPAR ) 15 MG tablet, Take 2 tablets (30 mg total) by mouth 2 (two) times daily., Disp: 360 tablet, Rfl: 3   Dextromethorphan-buPROPion  ER (AUVELITY ) 45-105 MG TBCR, Take 1 tablet by mouth 2 (two) times daily., Disp: 180 tablet, Rfl: 3 Medication Side Effects: Sweating  Family Medical/ Social History:  Changes?  Unable to her granddaughter. Her youngest son voluntarily committed himself this morning for SI.  He's an alcoholic too. Her husband had a seizure while cutting up a peach and cut himself in the side. He's ok.   MENTAL HEALTH EXAM:  There were no vitals taken for this visit.There is no height or weight on file to calculate BMI.  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  DIAGNOSES:    ICD-10-CM   1. Bipolar I disorder (HCC)  F31.9     2. Situational mixed anxiety and depressive disorder  F43.23     3. Insomnia due to other mental disorder  F51.05    F99     4. Stress due to illness of family member  Z63.79     5. Restless legs  G25.81     6. Obstructive sleep apnea  G47.33       Receiving Psychotherapy: No    [place]y Andreas in the past.   RECOMMENDATIONS:  PDMP was reviewed.  Last Xanax  filled 09/15/2023. I provided approximately 25 minutes of non-face-to-face time during this encounter, including time spent before and after the visit in records review, medical decision making, counseling pertinent to today's visit, and charting.   As far as her meds go, she's stable and no changes are needed.   She'll  continue healthy lifestyle choices with exercise, healthy diety, leaning on her faith to help with the circumstantial anx/dep.   Continue Xanax  1 mg, 1 po qid prn.  Continue Buspar  15 mg, to 2 po twice daily. Continue Auvelity  45-105 mg, 1 p.o. twice daily.   Continue hydroxyzine  to 50 mg, 1 p.o. every 6 hours as needed anxiety.   Continue Lamictal  200 mg, 2.5 pills daily. Continue Trileptal  300 mg, 1 in the morning, and 3 at night.  Continue Zoloft  100 mg, 3 p.o. daily. Continue vitamins as per med list.  Add biotin daily. Return in 6 weeks.  Verneita Cooks, PA-C

## 2023-10-08 MED ORDER — PANTOPRAZOLE SODIUM 40 MG PO TBEC: 40.0000 mg | DELAYED_RELEASE_TABLET | Freq: Every day | ORAL | 5 refills | Status: AC

## 2023-10-08 MED ORDER — FAMOTIDINE 40 MG PO TABS: 40.0000 mg | ORAL_TABLET | Freq: Every day | ORAL | 5 refills | Status: AC

## 2023-10-10 ENCOUNTER — Other Ambulatory Visit: Payer: Self-pay | Admitting: Physician Assistant

## 2023-10-10 ENCOUNTER — Other Ambulatory Visit: Payer: Self-pay | Admitting: Oncology

## 2023-10-10 DIAGNOSIS — E538 Deficiency of other specified B group vitamins: Secondary | ICD-10-CM

## 2023-10-10 DIAGNOSIS — D509 Iron deficiency anemia, unspecified: Secondary | ICD-10-CM

## 2023-10-15 ENCOUNTER — Other Ambulatory Visit: Payer: Self-pay | Admitting: Gastroenterology

## 2023-10-15 DIAGNOSIS — K5909 Other constipation: Secondary | ICD-10-CM

## 2023-10-21 ENCOUNTER — Telehealth: Payer: Self-pay | Admitting: Physician Assistant

## 2023-10-21 ENCOUNTER — Encounter: Payer: Self-pay | Admitting: Oncology

## 2023-10-21 NOTE — Telephone Encounter (Signed)
 Please see message from patient. I asked her if the alprazolam  was helping and she said she was better now than when she called this morning.

## 2023-10-21 NOTE — Telephone Encounter (Signed)
 Next visit is 11/17/23. Kristen [name] called stating she is having problems with her son. She is having no suicidal thoughts but is down about it. Her phone number is [phone].

## 2023-10-27 ENCOUNTER — Other Ambulatory Visit: Payer: Self-pay | Admitting: Physician Assistant

## 2023-10-30 ENCOUNTER — Encounter: Payer: Self-pay | Admitting: Oncology

## 2023-10-30 ENCOUNTER — Other Ambulatory Visit: Payer: Self-pay

## 2023-10-30 ENCOUNTER — Encounter (HOSPITAL_COMMUNITY): Payer: Self-pay

## 2023-10-30 ENCOUNTER — Emergency Department (HOSPITAL_COMMUNITY)
Admission: EM | Admit: 2023-10-30 | Discharge: 2023-10-30 | Disposition: A | Discharge status: Home / Self Care | Attending: Emergency Medicine | Admitting: Emergency Medicine

## 2023-10-30 ENCOUNTER — Emergency Department (HOSPITAL_COMMUNITY)

## 2023-10-30 DIAGNOSIS — R1032 Left lower quadrant pain: Secondary | ICD-10-CM | POA: Diagnosis present

## 2023-10-30 DIAGNOSIS — K5792 Diverticulitis of intestine, part unspecified, without perforation or abscess without bleeding: Secondary | ICD-10-CM

## 2023-10-30 DIAGNOSIS — K5732 Diverticulitis of large intestine without perforation or abscess without bleeding: Secondary | ICD-10-CM | POA: Insufficient documentation

## 2023-10-30 LAB — COMPREHENSIVE METABOLIC PANEL WITH GFR
ALT: 25 U/L (ref 0–44)
AST: 26 U/L (ref 15–41)
Albumin: 4.5 g/dL (ref 3.5–5.0)
Alkaline Phosphatase: 71 U/L (ref 38–126)
Anion gap: 13 (ref 5–15)
BUN: 19 mg/dL (ref 6–20)
CO2: 25 mmol/L (ref 22–32)
Calcium: 9.6 mg/dL (ref 8.9–10.3)
Chloride: 102 mmol/L (ref 98–111)
Creatinine, Ser: 1.05 mg/dL — ABNORMAL HIGH (ref 0.44–1.00)
GFR, Estimated: >60 mL/min (ref >60)
Glucose, Bld: 94 mg/dL (ref 70–99)
Potassium: 3.6 mmol/L (ref 3.5–5.1)
Sodium: 140 mmol/L (ref 135–145)
Total Bilirubin: 0.5 mg/dL (ref 0.0–1.2)
Total Protein: 7.4 g/dL (ref 6.5–8.1)

## 2023-10-30 LAB — CBC WITH DIFFERENTIAL/PLATELET
Abs Immature Granulocytes: 0.17 K/uL — ABNORMAL HIGH (ref 0.00–0.07)
Basophils Absolute: 0.1 K/uL (ref 0.0–0.1)
Basophils Relative: 1 %
Eosinophils Absolute: 0.3 K/uL (ref 0.0–0.5)
Eosinophils Relative: 2 %
HCT: 38.2 % (ref 36.0–46.0)
Hemoglobin: 12.6 g/dL (ref 12.0–15.0)
Immature Granulocytes: 1 %
Lymphocytes Relative: 13 %
Lymphs Abs: 2 K/uL (ref 0.7–4.0)
MCH: 29.9 pg (ref 26.0–34.0)
MCHC: 33 g/dL (ref 30.0–36.0)
MCV: 90.7 fL (ref 80.0–100.0)
Monocytes Absolute: 1.3 K/uL — ABNORMAL HIGH (ref 0.1–1.0)
Monocytes Relative: 8 %
Neutro Abs: 11.6 K/uL — ABNORMAL HIGH (ref 1.7–7.7)
Neutrophils Relative %: 75 %
Platelets: 446 K/uL — ABNORMAL HIGH (ref 150–400)
RBC: 4.21 MIL/uL (ref 3.87–5.11)
RDW: 13.4 % (ref 11.5–15.5)
WBC: 15.5 K/uL — ABNORMAL HIGH (ref 4.0–10.5)
nRBC: 0 % (ref 0.0–0.2)

## 2023-10-30 LAB — URINALYSIS, ROUTINE W REFLEX MICROSCOPIC
Bacteria, UA: NONE SEEN
Bilirubin Urine: NEGATIVE
Glucose, UA: NEGATIVE mg/dL
Ketones, ur: NEGATIVE mg/dL
Leukocytes,Ua: NEGATIVE
Nitrite: NEGATIVE
Protein, ur: NEGATIVE mg/dL
Specific Gravity, Urine: 1.009 (ref 1.005–1.030)
pH: 7 (ref 5.0–8.0)

## 2023-10-30 LAB — LIPASE, BLOOD: Lipase: 29 U/L (ref 11–51)

## 2023-10-30 MED ORDER — OXYCODONE-ACETAMINOPHEN 5-325 MG PO TABS: 1.0000 | ORAL_TABLET | Freq: Four times a day (QID) | ORAL | 0 refills | Status: DC | PRN

## 2023-10-30 MED ORDER — CIPROFLOXACIN HCL 500 MG PO TABS: 500.0000 mg | ORAL_TABLET | Freq: Two times a day (BID) | ORAL | 0 refills | Status: AC

## 2023-10-30 MED ORDER — MORPHINE SULFATE (PF) 4 MG/ML IV SOLN
4.0000 mg | Freq: Once | INTRAVENOUS | Status: AC
  Administered 2023-10-30: 4 mg via INTRAVENOUS
  Filled 2023-10-30: qty 1

## 2023-10-30 MED ORDER — METRONIDAZOLE 500 MG PO TABS
500.0000 mg | ORAL_TABLET | Freq: Two times a day (BID) | ORAL | 0 refills | Status: DC
Start: 2023-10-30 — End: 2023-11-18

## 2023-10-30 NOTE — ED Triage Notes (Signed)
 Pt arrived via POV from home c/o on-going abdominal pain, diarrhea recently, headache and reports concern for diverticulitis. Pt reports recently being on antibiotics for sinus and ear infections. Pt reports last BM was 2 days ago.

## 2023-10-30 NOTE — ED Triage Notes (Signed)
 Pt reports going to UC last week and tested negative for Flu and Covid.

## 2023-10-30 NOTE — ED Notes (Signed)
 Pt expressed she was unhappy with the service of the medical provider.

## 2023-10-30 NOTE — Discharge Instructions (Signed)
 Unfortunately the CT scan shows that you have recurrent diverticulitis.  Thankfully there was no complicating factors and this can be treated with antibiotics similar to prior flareups of your diverticulitis.  I would strongly recommend that you follow-up with the general surgeon and the gastroenterologist as you may need to have further consideration of potential surgery to prevent this from continuing to happen.  That being said that can be done in the outpatient setting.  I have prescribed medication for you including Cipro  twice a day for 7 days, Flagyl  twice a day for 7 days and several tablets of oxycodone  which can be used only for severe pain.  Please do not take oxycodone  if you are taking Xanax , taking the 2 of these together can cause your breathing to slow down and can be a life-threatening condition.  Thank you for allowing us  to treat you in the emergency department today.  After reviewing your examination and potential testing that was done it appears that you are safe to go home.  I would like for you to follow-up with your doctor within the next several days, have them obtain your records and follow-up with them to review all potential tests and results from your visit.  If you should develop severe or worsening symptoms return to the emergency department immediately

## 2023-10-30 NOTE — ED Notes (Signed)
 Pt requested provider was notified that the pain in the right ear is radiating all over her face and into her neck. Provider was notified and stated her ear was clear and symptoms have been over a couple of months and this was not something that would be treated in the ED today. Pt was updated.

## 2023-10-30 NOTE — ED Provider Notes (Signed)
 [facility] Provider Note   CSN: [id number] Arrival date & time: 10/30/23  1100     Patient presents with: Abdominal Pain   [facility] Kristen [name] is a 53 y.o. female.    Abdominal Pain  This patient is a very pleasant 53 year old female, she has a prior history of pancolonic diverticulosis with recurrent episodes of diverticulitis most recently diagnosed in July.  She has been on Augmentin  this last week Kristen [name] being told she had an ear infection, she developed pain over the last couple of days despite having this Augmentin  in her system.  She took her last dose today.  She denies fevers or chills, denies nausea or vomiting, she reports that she has pain focally on the left lower quadrant similar to prior episodes.    Prior to Admission medications   Medication Sig Start Date End Date Taking? Authorizing Provider  ciprofloxacin  (CIPRO ) 500 MG tablet Take 1 tablet (500 mg total) by mouth 2 (two) times daily for 7 days. 10/30/23 11/06/23 Yes Cleotilde Rogue, MD  metroNIDAZOLE  (FLAGYL ) 500 MG tablet Take 1 tablet (500 mg total) by mouth 2 (two) times daily. 10/30/23  Yes Cleotilde Rogue, MD  ondansetron  (ZOFRAN -ODT) 8 MG disintegrating tablet Take 1 tablet (8 mg total) by mouth 2 (two) times daily as needed for nausea or vomiting. 11/09/21  Yes Ezzard Sonny RAMAN, PA-C  oxyCODONE -acetaminophen  (PERCOCET/ROXICET) 5-325 MG tablet Take 1 tablet by mouth every 6 (six) hours as needed for severe pain (pain score 7-10). 10/30/23  Yes Cleotilde Rogue, MD  acetaminophen  (TYLENOL ) 500 MG tablet Take 1,000 mg by mouth every 6 (six) hours as needed for moderate pain.    [provider]  ALPRAZolam  (XANAX ) 1 MG tablet Take 1 tablet (1 mg total) by mouth every 6 (six) hours as needed for anxiety. 10/06/23   Rhys Boyer T, PA-C  busPIRone  (BUSPAR ) 15 MG tablet Take 2 tablets (30 mg total) by mouth 2 (two) times daily. 10/06/23   Rhys Boyer T, PA-C  cetirizine  (ZYRTEC )  10 MG tablet Take 10 mg by mouth daily.    [provider]  cyanocobalamin  (VITAMIN B12) 1000 MCG/ML injection Inject 1 mL (1,000 mcg total) into the muscle once a week. 1 ml weekly X 4 and monthly X 6 05/14/23   Geofm Delon BRAVO, NP  Dextromethorphan-buPROPion  ER (AUVELITY ) 45-105 MG TBCR Take 1 tablet by mouth 2 (two) times daily. 10/06/23   Rhys Boyer T, PA-C  famotidine  (PEPCID ) 40 MG tablet Take 1 tablet (40 mg total) by mouth daily before supper. 10/08/23   [place], PA-C  fenofibrate  (TRICOR ) 145 MG tablet Take 1 tablet (145 mg total) by mouth daily. 05/15/23   Shlomo Wilbert SAUNDERS, MD  fluticasone  (FLONASE ) 50 MCG/ACT nasal spray Place 1 spray into both nostrils daily.    [provider]  hydrOXYzine  (ATARAX ) 50 MG tablet TAKE 1 TABLET BY MOUTH EVERY 6 HOURS AS NEEDED 10/11/23   Rhys Boyer T, PA-C  lamoTRIgine  (LAMICTAL ) 200 MG tablet TAKE 2 & 1/2 (TWO & ONE-HALF) TABLETS BY MOUTH ONCE DAILY 10/03/23   Rhys Boyer T, PA-C  levothyroxine  (SYNTHROID ) 75 MCG tablet Take 75 mcg by mouth daily.    [provider]  metFORMIN  (GLUCOPHAGE -XR) 750 MG 24 hr tablet Take 750 mg by mouth daily. 08/11/21   [provider]  ondansetron  (ZOFRAN ) 8 MG tablet Take 1 tablet (8 mg total) by mouth every 8 (eight) hours as needed for nausea or vomiting.  11/29/22   Geofm Delon BRAVO, NP  Oxcarbazepine  (TRILEPTAL ) 300 MG tablet Take 2 tablets (600 mg total) by mouth 2 (two) times daily. Patient taking differently: Take 600 mg by mouth 2 (two) times daily. 1 po q am, 3 po qhs 07/31/23   [place], Verneita T, PA-C  pantoprazole  (PROTONIX ) 40 MG tablet Take 1 tablet (40 mg total) by mouth daily before breakfast. 10/08/23   [place], PA-C  rosuvastatin  (CRESTOR ) 5 MG tablet Take 1 tablet (5 mg total) by mouth daily. 05/15/23   Shlomo Wilbert SAUNDERS, MD  sertraline  (ZOLOFT ) 100 MG tablet TAKE 3 TABLETS BY MOUTH AT BEDTIME 10/28/23   Hurst, Teresa T, PA-C  SYRINGE-NEEDLE, DISP, 3 ML (LUER LOCK  SAFETY SYRINGES) 23G X 1 3 ML MISC To be used with B 12 injections 09/15/23   Geofm Delon BRAVO, NP  Tenapanor HCl (IBSRELA ) 50 MG TABS Take 1 tablet by mouth 2 (two) times daily. 10/15/23   [place], PA-C  valACYclovir (VALTREX) 500 MG tablet Take 500 mg by mouth daily.    [provider]    Allergies: Iohexol , Rosuvastatin , and Diatrizoate    Review of Systems  Gastrointestinal:  Positive for abdominal pain.  All other systems reviewed and are negative.   Updated Vital Signs BP 125/78 (BP Location: Right Arm)   Pulse 86   Temp 97.9 F (36.6 C) (Temporal)   Resp 18   Ht 1.676 m (5' 6)   Wt 91.7 kg   SpO2 97%   BMI 32.63 kg/m   Physical Exam Vitals and nursing note reviewed.  Constitutional:      General: She is not in acute distress.    Appearance: She is well-developed.  HENT:     Head: Normocephalic and atraumatic.     Mouth/Throat:     Pharynx: No oropharyngeal exudate.  Eyes:     General: No scleral icterus.       Right eye: No discharge.        Left eye: No discharge.     Conjunctiva/sclera: Conjunctivae normal.     Pupils: Pupils are equal, round, and reactive to light.  Neck:     Thyroid : No thyromegaly.     Vascular: No JVD.  Cardiovascular:     Rate and Rhythm: Normal rate and regular rhythm.     Heart sounds: Normal heart sounds. No murmur heard.    No friction rub. No gallop.  Pulmonary:     Effort: Pulmonary effort is normal. No respiratory distress.     Breath sounds: Normal breath sounds. No wheezing or rales.  Abdominal:     General: Bowel sounds are normal. There is no distension.     Palpations: Abdomen is soft. There is no mass.     Tenderness: There is abdominal tenderness.     Comments: Focal tenderness left lower quadrant, no guarding no peritoneal signs, no other abdominal tenderness  Musculoskeletal:        General: No tenderness. Normal range of motion.     Cervical back: Normal range of motion and neck supple.   Lymphadenopathy:     Cervical: No cervical adenopathy.  Skin:    General: Skin is warm and dry.     Findings: No erythema or rash.  Neurological:     Mental Status: She is alert.     Coordination: Coordination normal.  Psychiatric:        Behavior: Behavior normal.     (all labs ordered are listed, but only  abnormal results are displayed) Labs Reviewed  CBC WITH DIFFERENTIAL/PLATELET - Abnormal; Notable for the following components:      Result Value   WBC 15.5 (*)    Platelets 446 (*)    Neutro Abs 11.6 (*)    Monocytes Absolute 1.3 (*)    Abs Immature Granulocytes 0.17 (*)    All other components within normal limits  COMPREHENSIVE METABOLIC PANEL WITH GFR - Abnormal; Notable for the following components:   Creatinine, Ser 1.05 (*)    All other components within normal limits  URINALYSIS, ROUTINE W REFLEX MICROSCOPIC - Abnormal; Notable for the following components:   APPearance HAZY (*)    Hgb urine dipstick SMALL (*)    All other components within normal limits  LIPASE, BLOOD    EKG: None  Radiology: CT ABDOMEN PELVIS WO CONTRAST Result Date: 10/30/2023 CLINICAL DATA:  Left lower quadrant pain EXAM: CT ABDOMEN AND PELVIS WITHOUT CONTRAST TECHNIQUE: Multidetector CT imaging of the abdomen and pelvis was performed following the standard protocol without IV contrast. RADIATION DOSE REDUCTION: This exam was performed according to the departmental dose-optimization program which includes automated exposure control, adjustment of the mA and/or kV according to patient size and/or use of iterative reconstruction technique. COMPARISON:  CT abdomen pelvis August 25, 2023 FINDINGS: Lower chest: No acute abnormality. Hepatobiliary: No focal liver abnormality is seen. No gallstones, gallbladder wall thickening, or biliary dilatation. Pancreas: Unremarkable. No pancreatic ductal dilatation or surrounding inflammatory changes. Spleen: Normal in size without focal abnormality.  Adrenals/Urinary Tract: Adrenal glands are unremarkable. Bilateral renal nonobstructive calculi measuring up to 10 mm in left kidney upper pole are stable to prior calculi no hydronephrosis. Bladder is unremarkable. Stomach/Bowel: Stomach is within normal limits. Duodenal diverticulum. Appendix appears normal. Colonic diverticulosis with pericolonic fat stranding and wall thickening involving the rectosigmoid in right lower quadrant anterior to right ovary (2/72) consistent with acute diverticulitis. No discrete pericolonic fluid collection or abscess identified. Vascular/Lymphatic: Subtle calcification of abdominal aorta. No enlarged abdominal or pelvic lymph nodes. Reproductive: Retroverted uterus with multiple small fibroids some with calcification. Normal ovaries. Other: Tiny fat containing umbilical hernia. No abdominopelvic ascites or free air. Musculoskeletal: L5-S1 disc disease. No acute or significant osseous findings. IMPRESSION: Acute diverticulitis involving the rectosigmoid with pericolonic fat stranding. No fluid collection or abscess identified. Retroverted uterus with subserosal fibroids. Electronically Signed   By: Megan  Zare M.D.   On: 10/30/2023 13:48     Procedures   Medications Ordered in the ED  morphine  (PF) 4 MG/ML injection 4 mg (4 mg Intravenous Given 10/30/23 1244)                                    Medical Decision Making Amount and/or Complexity of Data Reviewed Labs: ordered. Radiology: ordered.  Risk Prescription drug management.    This patient presents to the ED for concern of abdominal pain differential diagnosis includes recurrent diverticulitis, could be perforation, abscess, seems less likely to be other complicating factors.    Additional history obtained   Additional history obtained from Electronic Medical Record External records from outside source obtained and reviewed including medical record including prior CT scans   Lab Tests:  I  Ordered, and personally interpreted labs.  The pertinent results include: Leukocytosis   Imaging Studies ordered:  I ordered imaging studies including CT scan of the abdomen and pelvis I independently visualized and interpreted imaging which showed  acute diverticulitis of the sigmoid colon I agree with the radiologist interpretation   Medicines ordered and prescription drug management:  I ordered medication including Cipro , Flagyl  as well as some oxycodone  for home    I have reviewed the patients home medicines and have made adjustments as needed   Problem List / ED Course:  Patient given morphine , improved, vitals normal, no hypotension fever or tachycardia, isolated leukocytosis   Social Determinants of Health:  Recurrent diverticulitis  I have discussed with the patient at the bedside the results, and the meaning of these results.  They have had opportunity to ask questions,  expressed their understanding to the need for follow-up with primary care physician        Final diagnoses:  Acute diverticulitis    ED Discharge Orders          Ordered    ciprofloxacin  (CIPRO ) 500 MG tablet  2 times daily        10/30/23 1422    metroNIDAZOLE  (FLAGYL ) 500 MG tablet  2 times daily        10/30/23 1422    oxyCODONE -acetaminophen  (PERCOCET/ROXICET) 5-325 MG tablet  Every 6 hours PRN        10/30/23 1422               Cleotilde Rogue, MD 10/30/23 1424

## 2023-10-31 ENCOUNTER — Encounter: Payer: Self-pay | Admitting: Oncology

## 2023-11-04 ENCOUNTER — Other Ambulatory Visit: Payer: Self-pay | Admitting: Urology

## 2023-11-05 ENCOUNTER — Encounter (HOSPITAL_COMMUNITY): Payer: Self-pay | Admitting: Urology

## 2023-11-05 NOTE — Progress Notes (Signed)
 Spoke w/ via phone for pre-op interview  Patient Lab needs dos--KUB      Lab results------ COVID test -Not indicated--patient states asymptomatic no test needed Arrive at --0800 NPO after MN  Pre-Surgery Ensure or G2:  Med rec completed Medications to take morning of surgery --- all on med list except not to take any nonsteroidal anti inflammatories, supplements and glucophage  Diabetic medication --Hold am of surgery  GLP1 agonist last dose: GLP1 instructions:  Patient instructed no nail polish to be worn day of surgery Patient instructed to bring photo id and insurance card day of surgery Patient aware to have Driver (ride ) / caregiver    for 24 hours after surgery - spouseGLENWOOD [name] [phone] Patient Special Instructions --On Thursday take laxative of choice, drink plenty so hydrated well, eat light meal that evening. Bring blue folder from [facility]. Pre-Op special Instructions --per Norita Dustman and Litho  Patient verbalized understanding of instructions that were given at this phone interview. Patient denies chest pain, sob, fever, cough at the interview.

## 2023-11-07 ENCOUNTER — Ambulatory Visit (HOSPITAL_COMMUNITY)
Admission: RE | Admit: 2023-11-07 | Discharge: 2023-11-07 | Disposition: A | Discharge status: Home / Self Care | Attending: Urology | Admitting: Urology

## 2023-11-07 ENCOUNTER — Encounter (HOSPITAL_COMMUNITY)
Admission: RE | Disposition: A | Discharge status: Home / Self Care | Payer: Self-pay | Source: Home / Self Care | Attending: Urology

## 2023-11-07 ENCOUNTER — Other Ambulatory Visit: Payer: Self-pay

## 2023-11-07 ENCOUNTER — Ambulatory Visit (HOSPITAL_COMMUNITY)

## 2023-11-07 ENCOUNTER — Encounter (HOSPITAL_COMMUNITY): Payer: Self-pay | Admitting: Urology

## 2023-11-07 DIAGNOSIS — Z87442 Personal history of urinary calculi: Secondary | ICD-10-CM | POA: Diagnosis not present

## 2023-11-07 DIAGNOSIS — E669 Obesity, unspecified: Secondary | ICD-10-CM | POA: Diagnosis not present

## 2023-11-07 DIAGNOSIS — Z79899 Other long term (current) drug therapy: Secondary | ICD-10-CM | POA: Insufficient documentation

## 2023-11-07 DIAGNOSIS — Z7982 Long term (current) use of aspirin: Secondary | ICD-10-CM | POA: Diagnosis not present

## 2023-11-07 DIAGNOSIS — N200 Calculus of kidney: Secondary | ICD-10-CM | POA: Insufficient documentation

## 2023-11-07 HISTORY — DX: Prediabetes: R73.03

## 2023-11-07 HISTORY — PX: EXTRACORPOREAL SHOCK WAVE LITHOTRIPSY: SHX1557

## 2023-11-07 LAB — GLUCOSE, CAPILLARY: Glucose-Capillary: 87 mg/dL (ref 70–99)

## 2023-11-07 SURGERY — LITHOTRIPSY, ESWL
Anesthesia: LOCAL | Laterality: Left

## 2023-11-07 MED ORDER — DIPHENHYDRAMINE HCL 25 MG PO CAPS
25.0000 mg | ORAL_CAPSULE | ORAL | Status: AC
  Administered 2023-11-07: 25 mg via ORAL
  Filled 2023-11-07: qty 1

## 2023-11-07 MED ORDER — TAMSULOSIN HCL 0.4 MG PO CAPS: 0.4000 mg | ORAL_CAPSULE | Freq: Every day | ORAL | 0 refills | Status: DC

## 2023-11-07 MED ORDER — CIPROFLOXACIN HCL 500 MG PO TABS
500.0000 mg | ORAL_TABLET | ORAL | Status: AC
  Administered 2023-11-07: 500 mg via ORAL
  Filled 2023-11-07: qty 1

## 2023-11-07 MED ORDER — SODIUM CHLORIDE 0.9 % IV SOLN: INTRAVENOUS | Status: DC

## 2023-11-07 MED ORDER — DIAZEPAM 5 MG PO TABS
10.0000 mg | ORAL_TABLET | ORAL | Status: AC
  Administered 2023-11-07: 10 mg via ORAL
  Filled 2023-11-07: qty 2

## 2023-11-07 MED ORDER — OXYCODONE-ACETAMINOPHEN 5-325 MG PO TABS: 1.0000 | ORAL_TABLET | ORAL | 0 refills | Status: DC | PRN

## 2023-11-07 MED ORDER — BISACODYL 5 MG PO TBEC
5.0000 mg | DELAYED_RELEASE_TABLET | Freq: Every day | ORAL | Status: DC | PRN
  Filled 2023-11-07: qty 1

## 2023-11-07 NOTE — Op Note (Signed)
[facility] Operative Note  Treating Physician: Christopher Winter, MD  Pre-op diagnosis: 7 mm left renal stone  Post-op diagnosis: Same   Procedure: LEFT ESWL  See [facility] OP note scanned into chart. Also because of the size, density, location and other factors that cannot be anticipated I feel this will likely be a staged procedure. This fact supersedes any indication in the scanned [facility] operative note to the contrary    

## 2023-11-07 NOTE — H&P (Signed)
See scanned [facility] documents for H&P.   

## 2023-11-10 ENCOUNTER — Encounter (HOSPITAL_COMMUNITY): Payer: Self-pay | Admitting: Urology

## 2023-11-14 ENCOUNTER — Encounter: Payer: Self-pay | Admitting: Oncology

## 2023-11-17 ENCOUNTER — Telehealth: Admitting: Physician Assistant

## 2023-11-18 ENCOUNTER — Encounter: Payer: Self-pay | Admitting: Oncology

## 2023-11-18 ENCOUNTER — Ambulatory Visit (INDEPENDENT_AMBULATORY_CARE_PROVIDER_SITE_OTHER): Admitting: Physician Assistant

## 2023-11-18 ENCOUNTER — Other Ambulatory Visit

## 2023-11-18 ENCOUNTER — Encounter: Payer: Self-pay | Admitting: Physician Assistant

## 2023-11-18 VITALS — BP 104/62 | HR 83 | Ht 66.0 in | Wt 209.1 lb

## 2023-11-18 DIAGNOSIS — D509 Iron deficiency anemia, unspecified: Secondary | ICD-10-CM

## 2023-11-18 DIAGNOSIS — R109 Unspecified abdominal pain: Secondary | ICD-10-CM

## 2023-11-18 DIAGNOSIS — K581 Irritable bowel syndrome with constipation: Secondary | ICD-10-CM | POA: Diagnosis not present

## 2023-11-18 DIAGNOSIS — K5909 Other constipation: Secondary | ICD-10-CM

## 2023-11-18 DIAGNOSIS — K5732 Diverticulitis of large intestine without perforation or abscess without bleeding: Secondary | ICD-10-CM | POA: Diagnosis not present

## 2023-11-18 DIAGNOSIS — Z860101 Personal history of adenomatous and serrated colon polyps: Secondary | ICD-10-CM

## 2023-11-18 LAB — CBC WITH DIFFERENTIAL/PLATELET
Basophils Absolute: 0.1 K/uL (ref 0.0–0.1)
Basophils Relative: 0.9 % (ref 0.0–3.0)
Eosinophils Absolute: 0.3 K/uL (ref 0.0–0.7)
Eosinophils Relative: 2.4 % (ref 0.0–5.0)
HCT: 30.3 % — ABNORMAL LOW (ref 36.0–46.0)
Hemoglobin: 10 g/dL — ABNORMAL LOW (ref 12.0–15.0)
Lymphocytes Relative: 16.1 % (ref 12.0–46.0)
Lymphs Abs: 1.9 K/uL (ref 0.7–4.0)
MCHC: 33 g/dL (ref 30.0–36.0)
MCV: 87.8 fl (ref 78.0–100.0)
Monocytes Absolute: 1.2 K/uL — ABNORMAL HIGH (ref 0.1–1.0)
Monocytes Relative: 10 % (ref 3.0–12.0)
Neutro Abs: 8.2 K/uL — ABNORMAL HIGH (ref 1.4–7.7)
Neutrophils Relative %: 70.6 % (ref 43.0–77.0)
Platelets: 758 K/uL — ABNORMAL HIGH (ref 150.0–400.0)
RBC: 3.45 Mil/uL — ABNORMAL LOW (ref 3.87–5.11)
RDW: 14.1 % (ref 11.5–15.5)
WBC: 11.6 K/uL — ABNORMAL HIGH (ref 4.0–10.5)

## 2023-11-18 MED ORDER — DICYCLOMINE HCL 20 MG PO TABS: 20.0000 mg | ORAL_TABLET | Freq: Two times a day (BID) | ORAL | 2 refills | Status: AC

## 2023-11-18 MED ORDER — CEFUROXIME AXETIL 500 MG PO TABS: 500.0000 mg | ORAL_TABLET | Freq: Two times a day (BID) | ORAL | 0 refills | Status: AC

## 2023-11-18 NOTE — Progress Notes (Signed)
 Ellouise Console, PA-C [address] [place], [place]  [address] Phone: [phone]   Primary Care Physician: Nena Rosina CROME, [place]  Primary Gastroenterologist:  Ellouise Console, PA-C / Elspeth Naval, MD   Chief Complaint: Follow-up recurrent diverticulitis and abdominal pain       HPI:   Kristen [name] is a 53 y.o. female returns for follow-up of recurrent diverticulitis and abdominal pain.  She underwent lithotripsy 11/07/2023.  Medical history of chronic constipation, recurrent diverticulitis, bipolar 1 disorder, obesity.  Was admitted to [facility] 11/02/2023 for LLQ pain, severe constipation, uncomplicated sigmoid diverticulitis.  Prior to admission she completed Augmentin  for an ear infection, followed by outpatient Flagyl  and Cipro  for presumed diverticulitis.  No improvement in LLQ pain.  CT imaging showed acute uncomplicated diverticulitis distal descending colon and sigmoid colon.  No abscess or perforation.  WBC 14.7.  Treated with IV Flagyl  and IV Cipro .  Was discharged home on p.o. Cipro  and Flagyl  for 7 more days.  Patient was given Dulcolax for constipation.  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  Currently on Ozempic  to help diabetes and help with weight management.  Current symptoms: Patient has constant persistent LLQ pain which is 8 out of 10.  No improvement with multiple antibiotics.  She just finished oral Cipro  and Flagyl  2 days ago.  Prior to that she was on IV Cipro  and Flagyl  in hospital.  Prior to that she took oral Augmentin .  She is taking Ibsrela  50 mg twice daily.  She reports having diarrhea every day on this treatment.  She has history of irritable bowel syndrome.  Admits to night sweats, chills, and fatigue.  No fever, nausea, or vomiting.  Denies rectal bleeding or black stools.  She takes B12 shot once per month.  Last IV iron  infusion through hematology was 09/19/2023.  She follows up with hematology every 3 months for iron   deficiency anemia.  She reports having 9 episodes of uncomplicated sigmoid diverticulitis in the past 3 years.  She has not had an abscess or perforation. She was evaluated by general surgeon Dr. Lonni Pizza 03/2023 for recurrent diverticulitis and chronic constipation.  Surgery was not advised.  She was advised to take consistent treatment to control constipation.  She has pandiverticulosis.  Dr. Pizza did discuss total colectomy with patient.  Also discussed referral to tertiary care center for severe chronic constipation.  11/07/2023 abdominal x-ray: Left lower kidney stone.  No other acute abnormality.  10/30/2023 CT abdomen pelvis: Acute diverticulitis involving the rectosigmoid colon with pericolonic fat stranding.  No abscess or perforation.  08/25/2023 CT abdomen pelvis:  1. Duodenal and total colonic diverticulosis. Mesenteric stranding along the mid to distal sigmoid colon (axial 68). While this could reflect changes from epiploic appendagitis, it is worrisome for acute, uncomplicated diverticulitis. No peridiverticular abscess or pneumoperitoneum.  2. Bilateral nonobstructive nephrolithiasis.  No hydronephrosis.  11/2020 last colonoscopy by Dr. Shaaron at [facility]: Pandiverticulosis, otherwise normal.  No polyps.  Adequate prep.  No biopsies.  7-year repeat due to previous history of adenomatous colon polyp.   09/2020 last EGD by Dr. Shaaron (for dysphagia): Normal.  Underwent empiric esophageal dilation with [facility] dilator to 56 FR.   07/2018 colonoscopy: 1 small 5 mm tubular adenoma polyp removed.   Patient has family history of colon cancer in her father.  Patient underwent genetic testing and was told she did not have the gene for colon cancer.  Current Outpatient Medications  Medication Sig Dispense Refill   acetaminophen  (TYLENOL ) 500 MG tablet Take 1,000 mg by mouth every 6 (six) hours as needed for moderate pain.     albuterol  (PROVENTIL ) (2.5 MG/3ML) 0.083% nebulizer  solution Take 2.5 mg by nebulization every 2 (two) hours as needed for shortness of breath.     albuterol  (VENTOLIN  HFA) 108 (90 Base) MCG/ACT inhaler Inhale 1 puff into the lungs every 4 (four) hours as needed.     ALPRAZolam  (XANAX ) 1 MG tablet Take 1 tablet (1 mg total) by mouth every 6 (six) hours as needed for anxiety. 120 tablet 5   busPIRone  (BUSPAR ) 15 MG tablet Take 2 tablets (30 mg total) by mouth 2 (two) times daily. 360 tablet 3   cetirizine  (ZYRTEC ) 10 MG tablet Take 10 mg by mouth daily.     cyanocobalamin  (VITAMIN B12) 1000 MCG/ML injection Inject 1 mL (1,000 mcg total) into the muscle once a week. 1 ml weekly X 4 and monthly X 6 10 mL 3   Dextromethorphan-buPROPion  ER (AUVELITY ) 45-105 MG TBCR Take 1 tablet by mouth 2 (two) times daily. 180 tablet 3   famotidine  (PEPCID ) 40 MG tablet Take 1 tablet (40 mg total) by mouth daily before supper. 30 tablet 5   fenofibrate  (TRICOR ) 145 MG tablet Take 1 tablet (145 mg total) by mouth daily. 30 tablet 1   fluticasone  (FLONASE ) 50 MCG/ACT nasal spray Place 1 spray into both nostrils daily.     hydrOXYzine  (ATARAX ) 50 MG tablet TAKE 1 TABLET BY MOUTH EVERY 6 HOURS AS NEEDED 120 tablet 1   lamoTRIgine  (LAMICTAL ) 200 MG tablet TAKE 2 & 1/2 (TWO & ONE-HALF) TABLETS BY MOUTH ONCE DAILY 225 tablet 0   levothyroxine  (SYNTHROID ) 75 MCG tablet Take 75 mcg by mouth daily.     metFORMIN  (GLUCOPHAGE -XR) 750 MG 24 hr tablet Take 750 mg by mouth daily.     ondansetron  (ZOFRAN ) 8 MG tablet Take 1 tablet (8 mg total) by mouth every 8 (eight) hours as needed for nausea or vomiting. 90 tablet 2   ondansetron  (ZOFRAN -ODT) 8 MG disintegrating tablet Take 1 tablet (8 mg total) by mouth 2 (two) times daily as needed for nausea or vomiting. 60 tablet 5   Oxcarbazepine  (TRILEPTAL ) 300 MG tablet Take 2 tablets (600 mg total) by mouth 2 (two) times daily. (Patient taking differently: Take 600 mg by mouth 2 (two) times daily. 1 po q am, 3 po qhs) 360 tablet 0    pantoprazole  (PROTONIX ) 40 MG tablet Take 1 tablet (40 mg total) by mouth daily before breakfast. 30 tablet 5   polyethylene glycol (MIRALAX  / GLYCOLAX ) 17 g packet Take 17 g by mouth daily.     Probiotic Product (ALIGN PO) Take by mouth.     rosuvastatin  (CRESTOR ) 5 MG tablet Take 1 tablet (5 mg total) by mouth daily. 30 tablet 1   RYBELSUS  7 MG TABS Take 1 tablet by mouth daily.     sertraline  (ZOLOFT ) 100 MG tablet TAKE 3 TABLETS BY MOUTH AT BEDTIME 90 tablet 0   SYRINGE-NEEDLE, DISP, 3 ML (LUER LOCK SAFETY SYRINGES) 23G X 1 3 ML MISC To be used with B 12 injections 12 each 1   tamsulosin  (FLOMAX ) 0.4 MG CAPS capsule Take 1 capsule (0.4 mg total) by mouth daily. 30 capsule 0   Tenapanor HCl (IBSRELA ) 50 MG TABS Take 1 tablet by mouth 2 (two) times daily. 60 tablet 11   valACYclovir (VALTREX) 500 MG tablet Take 500 mg by  mouth daily.     No current facility-administered medications for this visit.    Allergies as of 11/18/2023 - Review Complete 11/07/2023  Allergen Reaction Noted   Iohexol  Hives 11/03/2007   Rosuvastatin  Other (See Comments) 02/09/2020   Diatrizoate Other (See Comments) 02/16/2021    Past Medical History:  Diagnosis Date   Allergies    Anemia    Anxiety    Arthritis    Atypical mole 11/03/2006   mid upper back (slight to moderate)   Atypical mole 11/03/2006   lower right back (slight to moderate)   Back pain    Bipolar 1 disorder (HCC)    Borderline diabetic    Chronic constipation    Depression    Diabetes (HCC)    Diverticulitis    Family history of adverse reaction to anesthesia    mother-- ponv   Fatty liver    GAD (generalized anxiety disorder)    GERD (gastroesophageal reflux disease)    Hiatal hernia    High triglycerides    History of kidney stones    History of recurrent UTIs    History of sepsis 06/2018   History of stomach ulcers    History of suicidal ideation    Hyperlipidemia    Hypothyroidism    IDA (iron  deficiency anemia)     Joint pain    Melanoma (HCC) 11/03/2006   left chest in situ (excision)   Orthostasis    Palpitations    PONV (postoperative nausea and vomiting)    Pre-diabetes    Rapid heart rate    Renal calculus, left    Seasonal allergies    Shortness of breath    Sleep disorder, unspecified    per excessive sleeping during the day   Snoring    Suicide ideation    Swallowing difficulty    Tachycardia    Vertigo    Vitamin D  deficiency    Weakness    Wears contact lenses     Past Surgical History:  Procedure Laterality Date   ANTERIOR CERVICAL DECOMP/DISCECTOMY FUSION  02/17/2012   Procedure: ANTERIOR CERVICAL DECOMPRESSION/DISCECTOMY FUSION 2 LEVELS;  Surgeon: Lamar LELON Peaches, MD;  Location: [facility];  Service: Neurosurgery;  Laterality: N/A;  Cervical four-five and Cervical six-seven anterior cervial decompression with fusion plating and bonegraft   ANTERIOR CERVICAL DECOMP/DISCECTOMY FUSION  04-01-2001   @[facility]    C5---6   BIOPSY  07/20/2018   Procedure: BIOPSY;  Surgeon: Shaaron Lamar HERO, MD;  Location: [facility];  Service: Endoscopy;;  gastric    BREAST REDUCTION SURGERY Bilateral 1995   CESAREAN SECTION  x2   last one 1998   with BILATERAL TUBAL LIGATION   CESAREAN SECTION WITH BILATERAL TUBAL LIGATION     COLONOSCOPY WITH PROPOFOL  N/A 07/20/2018   Dr. Shaaron: Diverticulosis, internal hemorrhoids, 5 mm splenic flexure tubular adenoma removed. Repeat in 5 years due to family history.   COLONOSCOPY WITH PROPOFOL  N/A 12/07/2020   Procedure: COLONOSCOPY WITH PROPOFOL ;  Surgeon: Shaaron Lamar HERO, MD;  Location: [facility];  Service: Endoscopy;  Laterality: N/A;  2:30pm   CYSTOSCOPY/URETEROSCOPY/HOLMIUM LASER/STENT PLACEMENT Left 08/21/2018   Procedure: CYSTOSCOPY LEFT RETROGRADE PYELOGRAM /URETEROSCOPY/HOLMIUM LASER/STENT PLACEMENT;  Surgeon: Devere Lonni Righter, MD;  Location: [facility];  Service: Urology;  Laterality: Left;   DILATION AND CURETTAGE OF  UTERUS  1992   for miscarriage   ENDOMETRIAL ABLATION  2014   ESOPHAGOGASTRODUODENOSCOPY     20 years ago.    ESOPHAGOGASTRODUODENOSCOPY (  EGD) WITH PROPOFOL  N/A 07/20/2018   Dr. Shaaron: Small hiatal hernia, erythematous mucosa in the stomach with a healing gastric ulcer measuring 1 cm, biopsies negative.   ESOPHAGOGASTRODUODENOSCOPY (EGD) WITH PROPOFOL  N/A 10/05/2020   Procedure: ESOPHAGOGASTRODUODENOSCOPY (EGD) WITH PROPOFOL ;  Surgeon: Shaaron Lamar HERO, MD;  [facility]: [facility];  Service: Endoscopy;  Laterality: N/A;   EXTRACORPOREAL SHOCK WAVE LITHOTRIPSY Left 11/07/2023   Procedure: LITHOTRIPSY, ESWL;  Surgeon: Devere Lonni Righter, MD;  Location: [facility];  Service: Urology;  Laterality: Left;  LEFT EXTRACORPOREAL SHOCKWAVE LITHOTRIPSY   MALONEY DILATION N/A 10/05/2020   Procedure: MALONEY DILATION;  Surgeon: Shaaron Lamar HERO, MD;  [facility]: [facility];  Service: Endoscopy;  Laterality: N/A;   MICROTUBOPLASTY  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   POLYPECTOMY  07/20/2018   Procedure: POLYPECTOMY;  Surgeon: Shaaron Lamar HERO, MD;  [facility]: [facility];  Service: Endoscopy;;   POSTERIOR CERVICAL FUSION/FORAMINOTOMY N/A 08/18/2013   Procedure: CERVICAL FOUR TO CERVICAL SEVEN POSTERIOR CERVICAL FUSION/FORAMINOTOMY LEVEL 3;  Surgeon: Lamar LELON Peaches, MD;  Location: [facility];  Service: Neurosurgery;  Laterality: N/A;  C4-7 posterior cervical fusion with lateral mass fixation   RIGHT/LEFT HEART CATH AND CORONARY ANGIOGRAPHY N/A 12/23/2019   Procedure: RIGHT/LEFT HEART CATH AND CORONARY ANGIOGRAPHY;  Surgeon: Verlin Lonni BIRCH, MD;  Location: [facility];  Service: Cardiovascular;  Laterality: N/A;    Review of Systems:    All systems reviewed and negative except where noted in HPI.    Physical Exam:  Ht 5' 6 (1.676 m)   Wt 209 lb 2 oz (94.9 kg)   BMI 33.75 kg/m  No LMP recorded. Patient has had an ablation.  General:  Well-nourished, well-developed in no acute distress.  Lungs: Clear to auscultation bilaterally. Non-labored. Heart: Regular rate and rhythm, no murmurs rubs or gallops.  Abdomen: Bowel sounds are normal; Abdomen is Soft; No hepatosplenomegaly, masses or hernias;  No Abdominal Tenderness; No guarding or rebound tenderness. Neuro: Alert and oriented x 3.  Grossly intact.  Psych: Alert and cooperative, normal mood and affect.   Imaging Studies: DG Abd 1 View Result Date: 11/07/2023 CLINICAL DATA:  Left renal stone EXAM: ABDOMEN - 1 VIEW COMPARISON:  CT abdomen pelvis October 30, 2023 FINDINGS: The bowel gas pattern is normal. Left kidney lower pole nephrolithiasis measuring 9 mm. Sclerotic osseous lesion overlying the left acetabulum likely bone island. Mild dextroconvex curvature of the spine. Transitional vertebrae identified IMPRESSION: Left kidney lower pole nephrolithiasis. Electronically Signed   By: Megan  Zare M.D.   On: 11/07/2023 14:39   CT ABDOMEN PELVIS WO CONTRAST Result Date: 10/30/2023 CLINICAL DATA:  Left lower quadrant pain EXAM: CT ABDOMEN AND PELVIS WITHOUT CONTRAST TECHNIQUE: Multidetector CT imaging of the abdomen and pelvis was performed following the standard protocol without IV contrast. RADIATION DOSE REDUCTION: This exam was performed according to the departmental dose-optimization program which includes automated exposure control, adjustment of the mA and/or kV according to patient size and/or use of iterative reconstruction technique. COMPARISON:  CT abdomen pelvis August 25, 2023 FINDINGS: Lower chest: No acute abnormality. Hepatobiliary: No focal liver abnormality is seen. No gallstones, gallbladder wall thickening, or biliary dilatation. Pancreas: Unremarkable. No pancreatic ductal dilatation or surrounding inflammatory changes. Spleen: Normal in size without focal abnormality. Adrenals/Urinary Tract: Adrenal glands are unremarkable. Bilateral renal nonobstructive calculi  measuring up to 10 mm in left kidney upper pole are stable to prior calculi no hydronephrosis.  Bladder is unremarkable. Stomach/Bowel: Stomach is within normal limits. Duodenal diverticulum. Appendix appears normal. Colonic diverticulosis with pericolonic fat stranding and wall thickening involving the rectosigmoid in right lower quadrant anterior to right ovary (2/72) consistent with acute diverticulitis. No discrete pericolonic fluid collection or abscess identified. Vascular/Lymphatic: Subtle calcification of abdominal aorta. No enlarged abdominal or pelvic lymph nodes. Reproductive: Retroverted uterus with multiple small fibroids some with calcification. Normal ovaries. Other: Tiny fat containing umbilical hernia. No abdominopelvic ascites or free air. Musculoskeletal: L5-S1 disc disease. No acute or significant osseous findings. IMPRESSION: Acute diverticulitis involving the rectosigmoid with pericolonic fat stranding. No fluid collection or abscess identified. Retroverted uterus with subserosal fibroids. Electronically Signed   By: Megan  Zare M.D.   On: 10/30/2023 13:48    Labs: CBC    Component Value Date/Time   WBC 15.5 (H) 10/30/2023 1125   RBC 4.21 10/30/2023 1125   HGB 12.6 10/30/2023 1125   HGB 12.0 06/06/2022 1029   HGB 12.9 12/17/2019 1052   HCT 38.2 10/30/2023 1125   HCT 39.0 12/17/2019 1052   PLT 446 (H) 10/30/2023 1125   PLT 445 (H) 06/06/2022 1029   PLT 425 12/17/2019 1052   MCV 90.7 10/30/2023 1125   MCV 86 12/17/2019 1052   MCH 29.9 10/30/2023 1125   MCHC 33.0 10/30/2023 1125   RDW 13.4 10/30/2023 1125   RDW 13.5 12/17/2019 1052   LYMPHSABS 2.0 10/30/2023 1125   LYMPHSABS 3.0 12/17/2019 1052   MONOABS 1.3 (H) 10/30/2023 1125   EOSABS 0.3 10/30/2023 1125   EOSABS 0.8 (H) 12/17/2019 1052   BASOSABS 0.1 10/30/2023 1125   BASOSABS 0.1 12/17/2019 1052    CMP     Component Value Date/Time   NA 140 10/30/2023 1125   NA 137 12/17/2019 1052   K 3.6 10/30/2023 1125    CL 102 10/30/2023 1125   CO2 25 10/30/2023 1125   GLUCOSE 94 10/30/2023 1125   BUN 19 10/30/2023 1125   BUN 19 12/17/2019 1052   CREATININE 1.05 (H) 10/30/2023 1125   CREATININE 1.05 (H) 06/06/2022 1029   CREATININE 1.09 (H) 08/02/2020 1049   CALCIUM  9.6 10/30/2023 1125   CALCIUM  9.6 06/10/2019 1539   PROT 7.4 10/30/2023 1125   PROT 7.1 05/12/2020 1117   ALBUMIN 4.5 10/30/2023 1125   ALBUMIN 4.6 05/12/2020 1117   AST 26 10/30/2023 1125   AST 24 06/06/2022 1029   ALT 25 10/30/2023 1125   ALT 17 06/06/2022 1029   ALKPHOS 71 10/30/2023 1125   BILITOT 0.5 10/30/2023 1125   BILITOT 0.4 06/06/2022 1029   GFRNONAA >60 10/30/2023 1125   GFRNONAA >60 06/06/2022 1029   GFRNONAA 59 (L) 08/02/2020 1049   GFRAA 69 08/02/2020 1049       Assessment and Plan:   MIGUEL MEDAL is a 53 y.o. y/o female who returns for follow-up of severe left lower quadrant pain, not improved on antibiotics.  It is uncertain if this is truly diverticulitis or due to chronic constipation and IBS.  She has not had any complicating factors such as perforation or abscess.  She has had iron  deficiency anemia and leukocytosis.  I am repeating labs today and treating with more antibiotics.  She recently underwent lithotripsy and has follow-up with urology.  Stressed the importance of continued daily treatment for chronic constipation.  We may need to refer to tertiary care center for further management of severe constipation.  1.  Acute uncomplicated sigmoid diverticulitis - Refer back to general surgeon  to discuss colectomy - Rx Ceftin 500 mg twice daily x 14 days, #28, no refills.  2.  Chronic constipation; IBS-C - Continue Ibsrela  50 mg twice daily - Also add MiraLAX  once daily as needed - Also add fiber supplement such as Benefiber or Metamucil daily.  3.  Persistent left lower quadrant pain: Uncertain if this is colon spasm from IBS versus constipation versus persistent diverticulitis. - Extend antibiotic  treatment - Start dicyclomine  20 Mg 1 tablet twice daily, #60, 2 refills.  4.  Iron  deficiency anemia and leukocytosis - Labs CBC, iron  panel, ferritin - If iron  is low, then recommend she follow-up with hematology for IV iron . - I believe she needs repeat EGD and colonoscopy, however we need to wait at least 6 weeks after resolution of diverticulitis before performing colonoscopy due to increased risk of colon perforation.  Patient expressed understanding.  5.  Bipolar 1 disorder: Suspect this may be complicating her clinical symptoms.   Ellouise Console, PA-C  Follow up with TG in 2 to 3 weeks to assess response to antibiotic treatment.

## 2023-11-18 NOTE — Patient Instructions (Addendum)
 We are sending a referral to [facility]. Make certain to bring a list of current medications, including any over the counter medications or vitamins. Also bring your co-pay if you have one as well as your insurance cards. [facility] is located at [address]. Should you need to reschedule your appointment, please contact them at [phone].  Continue Ibsrella 50mg  twice daily for Constipaiton and IBS.  Rx Ceftin antibiotic 500mg  1 tablet twice daily for 2 weeks for Diverticulitis.  Rx Dicyclomine  20mg  1 tablet twice daily for abdominal pain and colon spasm.    Start OTC Benefiber Powder. Mix 1 - 2 Tablespoons in 6 - 8 ounces of a Drink Once Daily. Drink 64 ounces of water  / fluids Daily.   Please follow up sooner if symptoms increase or worsen  Due to recent changes in healthcare laws, you may see the results of your imaging and laboratory studies on MyChart before your provider has had a chance to review them.  We understand that in some cases there may be results that are confusing or concerning to you. Not all laboratory results come back in the same time frame and the provider may be waiting for multiple results in order to interpret others.  Please give us  48 hours in order for your provider to thoroughly review all the results before contacting the office for clarification of your results.   Thank you for trusting me with your gastrointestinal care!   Ellouise Console, PA-C _______________________________________________________  If your blood pressure at your visit was 140/90 or greater, please contact your primary care physician to follow up on this.  _______________________________________________________  If you are age 71 or older, your body mass index should be between 23-30. Your Body mass index is 33.75 kg/m. If this is out of the aforementioned range listed, please consider follow up with your Primary Care Provider.  If you  are age 74 or younger, your body mass index should be between 19-25. Your Body mass index is 33.75 kg/m. If this is out of the aformentioned range listed, please consider follow up with your Primary Care Provider.   ________________________________________________________  The [facility] providers would like to encourage you to use MYCHART to communicate with providers for non-urgent requests or questions.  Due to long hold times on the telephone, sending your provider a message by [facility] may be a faster and more efficient way to get a response.  Please allow 48 business hours for a response.  Please remember that this is for non-urgent requests.  _______________________________________________________

## 2023-11-19 ENCOUNTER — Other Ambulatory Visit: Payer: Self-pay

## 2023-11-19 ENCOUNTER — Ambulatory Visit: Payer: Self-pay | Admitting: Physician Assistant

## 2023-11-19 DIAGNOSIS — K5732 Diverticulitis of large intestine without perforation or abscess without bleeding: Secondary | ICD-10-CM

## 2023-11-19 DIAGNOSIS — R109 Unspecified abdominal pain: Secondary | ICD-10-CM

## 2023-11-19 LAB — IRON,TIBC AND FERRITIN PANEL
%SAT: 14 % — ABNORMAL LOW (ref 16–45)
Ferritin: 760 ng/mL — ABNORMAL HIGH (ref 16–232)
Iron: 52 ug/dL (ref 45–160)
TIBC: 363 ug/dL (ref 250–450)

## 2023-11-19 MED ORDER — DIPHENHYDRAMINE HCL 50 MG PO TABS: ORAL_TABLET | ORAL | 0 refills | Status: DC

## 2023-11-19 MED ORDER — PREDNISONE 50 MG PO TABS
ORAL_TABLET | ORAL | 0 refills | Status: DC
Start: 2023-11-19 — End: 2024-01-07

## 2023-11-19 NOTE — Telephone Encounter (Signed)
 Looking at her most recent lab work, she is iron  deficient.  Can you get her scheduled to come in for 2 doses of IV Feraheme.  Her platelets are elevated likely secondary to IDA.  She is going to have a CT abdomen that will be ordered by GI for her elevated white count.  Does she have an appointment coming up soon?  Delon Hope, NP 11/19/2023 1:57 PM

## 2023-11-19 NOTE — Telephone Encounter (Signed)
 Kristen [name]- I apologize. Question about CT contrast appears to have been answered in multiple other messages between patient and our office today. Please see those messages for additional information.  It also appears patient has already been made aware of her available lab results along with recommendations.

## 2023-11-19 NOTE — Telephone Encounter (Signed)
 Ozempic  is not on current medication list for this patient. No changes made.

## 2023-11-19 NOTE — Telephone Encounter (Signed)
 FYI

## 2023-11-19 NOTE — Progress Notes (Signed)
 Agree with assessment with the following thoughts.  Ellouise if this patient has persistent pain despite treatment for diverticulitis (noted on last CT), despite 2 courses of antibiotics, I think she needs repeat imaging to ensure no complications from diverticulitis. She has a mild leukocytosis. I'd recommend a repeat CT scan abdomen / pelvis ASAP. Also, with recent flare of diverticulitis I'd hold off on fiber supplement right now and use MIralax  / IBSrela . Thanks  POD A RN - can you help coordinate CT scan abdomen / pelvis ASAP. thanks

## 2023-11-19 NOTE — Telephone Encounter (Signed)
 November 14 th with lab on November 7 th

## 2023-11-21 ENCOUNTER — Inpatient Hospital Stay: Attending: Oncology

## 2023-11-21 VITALS — BP 106/62 | HR 78 | Temp 98.3°F | Resp 18

## 2023-11-21 DIAGNOSIS — Z79899 Other long term (current) drug therapy: Secondary | ICD-10-CM | POA: Insufficient documentation

## 2023-11-21 DIAGNOSIS — D72829 Elevated white blood cell count, unspecified: Secondary | ICD-10-CM | POA: Insufficient documentation

## 2023-11-21 DIAGNOSIS — R109 Unspecified abdominal pain: Secondary | ICD-10-CM | POA: Diagnosis not present

## 2023-11-21 DIAGNOSIS — D75839 Thrombocytosis, unspecified: Secondary | ICD-10-CM | POA: Diagnosis present

## 2023-11-21 DIAGNOSIS — K5732 Diverticulitis of large intestine without perforation or abscess without bleeding: Secondary | ICD-10-CM | POA: Diagnosis not present

## 2023-11-21 DIAGNOSIS — D509 Iron deficiency anemia, unspecified: Secondary | ICD-10-CM

## 2023-11-21 MED ORDER — SODIUM CHLORIDE 0.9 % IV SOLN
510.0000 mg | Freq: Once | INTRAVENOUS | Status: AC
  Administered 2023-11-21: 510 mg via INTRAVENOUS
  Filled 2023-11-21: qty 510

## 2023-11-21 MED ORDER — SODIUM CHLORIDE 0.9 % IV SOLN: INTRAVENOUS | Status: DC

## 2023-11-21 MED ORDER — CYANOCOBALAMIN 1000 MCG/ML IJ SOLN
1000.0000 ug | Freq: Once | INTRAMUSCULAR | Status: AC
  Administered 2023-11-21: 1000 ug via INTRAMUSCULAR
  Filled 2023-11-21: qty 1

## 2023-11-21 NOTE — Progress Notes (Signed)
 Patient presents today for Feraheme infusion and B12 injection  per providers order.  Vital signs WNL.  Patient has no new complaints at this time.    Peripheral IV started and blood return noted pre and post infusion.  Stable during administration without incident; injection site WNL; see MAR for injection details.  Patient tolerated procedure well and without incident.  No questions or complaints noted at this time. Tolerated infusion without adverse affects.  Vital signs stable.  Discharge from clinic ambulatory in stable condition.  Alert and oriented X 3.  Follow up with [facility] as scheduled.

## 2023-11-21 NOTE — Patient Instructions (Signed)

## 2023-11-24 ENCOUNTER — Other Ambulatory Visit: Payer: Self-pay | Admitting: Physician Assistant

## 2023-11-28 ENCOUNTER — Inpatient Hospital Stay

## 2023-11-28 ENCOUNTER — Ambulatory Visit (HOSPITAL_COMMUNITY)
Admission: RE | Admit: 2023-11-28 | Discharge: 2023-11-28 | Disposition: A | Discharge status: Home / Self Care | Source: Ambulatory Visit | Attending: Gastroenterology | Admitting: Gastroenterology

## 2023-11-28 VITALS — BP 121/71 | HR 80 | Temp 97.7°F | Resp 18

## 2023-11-28 DIAGNOSIS — R109 Unspecified abdominal pain: Secondary | ICD-10-CM | POA: Insufficient documentation

## 2023-11-28 DIAGNOSIS — D72829 Elevated white blood cell count, unspecified: Secondary | ICD-10-CM | POA: Diagnosis not present

## 2023-11-28 DIAGNOSIS — K5732 Diverticulitis of large intestine without perforation or abscess without bleeding: Secondary | ICD-10-CM | POA: Insufficient documentation

## 2023-11-28 DIAGNOSIS — D509 Iron deficiency anemia, unspecified: Secondary | ICD-10-CM

## 2023-11-28 MED ORDER — SODIUM CHLORIDE 0.9 % IV SOLN: INTRAVENOUS | Status: DC

## 2023-11-28 MED ORDER — CETIRIZINE HCL 10 MG/ML IV SOLN
5.0000 mg | Freq: Once | INTRAVENOUS | Status: AC
  Administered 2023-11-28: 5 mg via INTRAVENOUS
  Filled 2023-11-28: qty 1

## 2023-11-28 MED ORDER — SODIUM CHLORIDE 0.9 % IV SOLN
510.0000 mg | Freq: Once | INTRAVENOUS | Status: AC
  Administered 2023-11-28: 510 mg via INTRAVENOUS
  Filled 2023-11-28: qty 510

## 2023-11-28 MED ADMIN — Iohexol Inj 300 MG/ML: 100 mL | INTRAVENOUS | NDC 00407141363

## 2023-11-28 NOTE — Progress Notes (Signed)
 Give 5 mg cetirizine  IV prior to Feraheme.  Patient will be taking diphenhydramine  prior to CT scan at 10am per Vernell Pepper, RN.  Niels Molt, PharmD

## 2023-11-28 NOTE — Patient Instructions (Signed)
 [facility] [place] - A DEPT OF [facility]  Discharge Instructions: Thank you for choosing [facility] to provide your oncology and hematology care.  If you have a lab appointment with the Cancer Center - please note that after April 8th, 2024, all labs will be drawn in the cancer center.  You do not have to check in or register with the main entrance as you have in the past but will complete your check-in in the cancer center.  Wear comfortable clothing and clothing appropriate for easy access to any Portacath or PICC line.   We strive to give you quality time with your provider. You may need to reschedule your appointment if you arrive late (15 or more minutes).  Arriving late affects you and other patients whose appointments are after yours.  Also, if you miss three or more appointments without notifying the office, you may be dismissed from the clinic at the provider's discretion.      For prescription refill requests, have your pharmacy contact our office and allow 72 hours for refills to be completed.    Today you received the following feraheme, return as scheduled.   To help prevent nausea and vomiting after your treatment, we encourage you to take your nausea medication as directed.  BELOW ARE SYMPTOMS THAT SHOULD BE REPORTED IMMEDIATELY: *FEVER GREATER THAN 100.4 F (38 C) OR HIGHER *CHILLS OR SWEATING *NAUSEA AND VOMITING THAT IS NOT CONTROLLED WITH YOUR NAUSEA MEDICATION *UNUSUAL SHORTNESS OF BREATH *UNUSUAL BRUISING OR BLEEDING *URINARY PROBLEMS (pain or burning when urinating, or frequent urination) *BOWEL PROBLEMS (unusual diarrhea, constipation, pain near the anus) TENDERNESS IN MOUTH AND THROAT WITH OR WITHOUT PRESENCE OF ULCERS (sore throat, sores in mouth, or a toothache) UNUSUAL RASH, SWELLING OR PAIN  UNUSUAL VAGINAL DISCHARGE OR ITCHING   Items with * indicate a potential emergency and should be followed up as soon as possible or  go to the Emergency Department if any problems should occur.  Please show the CHEMOTHERAPY ALERT CARD or IMMUNOTHERAPY ALERT CARD at check-in to the Emergency Department and triage nurse.  Should you have questions after your visit or need to cancel or reschedule your appointment, please contact [facility] [place] - A DEPT OF [facility] [phone]  and follow the prompts.  Office hours are 8:00 a.m. to 4:30 p.m. Monday - Friday. Please note that voicemails left after 4:00 p.m. may not be returned until the following business day.  We are closed weekends and major holidays. You have access to a nurse at all times for urgent questions. Please call the main number to the clinic [phone] and follow the prompts.  For any non-urgent questions, you may also contact your provider using MyChart. We now offer e-Visits for anyone 36 and older to request care online for non-urgent symptoms. For details visit [email or web address].   Also download the MyChart app! Go to the app store, search "MyChart", open the app, select , and log in with your MyChart username and password.

## 2023-11-28 NOTE — Progress Notes (Signed)
 Patient tolerated iron infusion with no complaints voiced.  Peripheral IV site clean and dry with good blood return noted before and after infusion.  Band aid applied.  VSS with discharge and left in satisfactory condition with no s/s of distress noted.

## 2023-11-29 ENCOUNTER — Telehealth: Payer: Self-pay | Admitting: Gastroenterology

## 2023-11-29 ENCOUNTER — Emergency Department (HOSPITAL_COMMUNITY)
Admission: EM | Admit: 2023-11-29 | Discharge: 2023-11-30 | Attending: Emergency Medicine | Admitting: Emergency Medicine

## 2023-11-29 ENCOUNTER — Encounter (HOSPITAL_COMMUNITY): Payer: Self-pay | Admitting: Emergency Medicine

## 2023-11-29 DIAGNOSIS — S37019A Minor contusion of unspecified kidney, initial encounter: Secondary | ICD-10-CM | POA: Insufficient documentation

## 2023-11-29 DIAGNOSIS — R109 Unspecified abdominal pain: Secondary | ICD-10-CM | POA: Diagnosis present

## 2023-11-29 DIAGNOSIS — S37012A Minor contusion of left kidney, initial encounter: Secondary | ICD-10-CM | POA: Diagnosis not present

## 2023-11-29 DIAGNOSIS — X58XXXA Exposure to other specified factors, initial encounter: Secondary | ICD-10-CM | POA: Diagnosis not present

## 2023-11-29 DIAGNOSIS — Z5321 Procedure and treatment not carried out due to patient leaving prior to being seen by health care provider: Secondary | ICD-10-CM | POA: Diagnosis not present

## 2023-11-29 LAB — CBC
HCT: 35 % — ABNORMAL LOW (ref 36.0–46.0)
Hemoglobin: 10.9 g/dL — ABNORMAL LOW (ref 12.0–15.0)
MCH: 28.8 pg (ref 26.0–34.0)
MCHC: 31.1 g/dL (ref 30.0–36.0)
MCV: 92.3 fL (ref 80.0–100.0)
Platelets: 645 K/uL — ABNORMAL HIGH (ref 150–400)
RBC: 3.79 MIL/uL — ABNORMAL LOW (ref 3.87–5.11)
RDW: 14.6 % (ref 11.5–15.5)
WBC: 15 K/uL — ABNORMAL HIGH (ref 4.0–10.5)
nRBC: 0 % (ref 0.0–0.2)

## 2023-11-29 LAB — COMPREHENSIVE METABOLIC PANEL WITH GFR
ALT: 15 U/L (ref 0–44)
AST: 20 U/L (ref 15–41)
Albumin: 4.2 g/dL (ref 3.5–5.0)
Alkaline Phosphatase: 89 U/L (ref 38–126)
Anion gap: 14 (ref 5–15)
BUN: 18 mg/dL (ref 6–20)
CO2: 21 mmol/L — ABNORMAL LOW (ref 22–32)
Calcium: 9.3 mg/dL (ref 8.9–10.3)
Chloride: 105 mmol/L (ref 98–111)
Creatinine, Ser: 0.88 mg/dL (ref 0.44–1.00)
GFR, Estimated: >60 mL/min (ref >60)
Glucose, Bld: 68 mg/dL — ABNORMAL LOW (ref 70–99)
Potassium: 4 mmol/L (ref 3.5–5.1)
Sodium: 140 mmol/L (ref 135–145)
Total Bilirubin: 0.4 mg/dL (ref 0.0–1.2)
Total Protein: 6.9 g/dL (ref 6.5–8.1)

## 2023-11-29 LAB — I-STAT CG4 LACTIC ACID, ED: Lactic Acid, Venous: 0.9 mmol/L (ref 0.5–1.9)

## 2023-11-29 LAB — TYPE AND SCREEN
ABO/RH(D): O POS
Antibody Screen: NEGATIVE

## 2023-11-29 NOTE — ED Notes (Signed)
 Pt stated she will look at her blood results on my chart, and refuses to wait any longer.

## 2023-11-29 NOTE — Telephone Encounter (Signed)
 OnCall Note  Called patient and informed CT results. Large L renal subcapsular hematoma !0.5 X 3.8 cm She underwent lithotripsy 2 weeks ago  Advised patient to come to [facility] ER to make sure she does not have ongoing bleed into the L renal subcapsular hematoma or severe anemia requiring transfusion  She is asymptomatic   The patient was provided an opportunity to ask questions and all were answered. The patient agreed with the plan and demonstrated an understanding of the instructions.  LOIS Wilkie Mcgee , MD [phone]

## 2023-11-29 NOTE — ED Triage Notes (Signed)
 Pt here from home sent over by MD for an abnormal ct scan showing an hematoma on her kidney ,

## 2023-11-30 ENCOUNTER — Telehealth: Payer: Self-pay | Admitting: Nurse Practitioner

## 2023-11-30 ENCOUNTER — Ambulatory Visit: Payer: Self-pay | Admitting: Gastroenterology

## 2023-11-30 ENCOUNTER — Emergency Department (HOSPITAL_BASED_OUTPATIENT_CLINIC_OR_DEPARTMENT_OTHER)
Admission: EM | Admit: 2023-11-30 | Discharge: 2023-11-30 | Disposition: A | Discharge status: Home / Self Care | Attending: Emergency Medicine | Admitting: Emergency Medicine

## 2023-11-30 ENCOUNTER — Encounter (HOSPITAL_BASED_OUTPATIENT_CLINIC_OR_DEPARTMENT_OTHER): Payer: Self-pay | Admitting: Emergency Medicine

## 2023-11-30 DIAGNOSIS — S37012D Minor contusion of left kidney, subsequent encounter: Secondary | ICD-10-CM

## 2023-11-30 DIAGNOSIS — S37012A Minor contusion of left kidney, initial encounter: Secondary | ICD-10-CM | POA: Insufficient documentation

## 2023-11-30 DIAGNOSIS — X58XXXA Exposure to other specified factors, initial encounter: Secondary | ICD-10-CM | POA: Insufficient documentation

## 2023-11-30 LAB — URINALYSIS, MICROSCOPIC (REFLEX): WBC, UA: >50 WBC/hpf (ref 0–5)

## 2023-11-30 LAB — CBC WITH DIFFERENTIAL/PLATELET
Abs Immature Granulocytes: 0.29 K/uL — ABNORMAL HIGH (ref 0.00–0.07)
Basophils Absolute: 0.1 K/uL (ref 0.0–0.1)
Basophils Relative: 1 %
Eosinophils Absolute: 0.2 K/uL (ref 0.0–0.5)
Eosinophils Relative: 2 %
HCT: 34 % — ABNORMAL LOW (ref 36.0–46.0)
Hemoglobin: 11 g/dL — ABNORMAL LOW (ref 12.0–15.0)
Immature Granulocytes: 3 %
Lymphocytes Relative: 23 %
Lymphs Abs: 2.7 K/uL (ref 0.7–4.0)
MCH: 28.9 pg (ref 26.0–34.0)
MCHC: 32.4 g/dL (ref 30.0–36.0)
MCV: 89.2 fL (ref 80.0–100.0)
Monocytes Absolute: 1.3 K/uL — ABNORMAL HIGH (ref 0.1–1.0)
Monocytes Relative: 11 %
Neutro Abs: 7.1 K/uL (ref 1.7–7.7)
Neutrophils Relative %: 60 %
Platelets: 578 K/uL — ABNORMAL HIGH (ref 150–400)
RBC: 3.81 MIL/uL — ABNORMAL LOW (ref 3.87–5.11)
RDW: 14.7 % (ref 11.5–15.5)
WBC: 11.7 K/uL — ABNORMAL HIGH (ref 4.0–10.5)
nRBC: 0 % (ref 0.0–0.2)

## 2023-11-30 LAB — URINALYSIS, ROUTINE W REFLEX MICROSCOPIC
Bilirubin Urine: NEGATIVE
Glucose, UA: NEGATIVE mg/dL
Hgb urine dipstick: NEGATIVE
Ketones, ur: NEGATIVE mg/dL
Nitrite: NEGATIVE
Protein, ur: NEGATIVE mg/dL
Specific Gravity, Urine: 1.02 (ref 1.005–1.030)
pH: 7 (ref 5.0–8.0)

## 2023-11-30 LAB — COMPREHENSIVE METABOLIC PANEL WITH GFR
ALT: 15 U/L (ref 0–44)
AST: 24 U/L (ref 15–41)
Albumin: 4.7 g/dL (ref 3.5–5.0)
Alkaline Phosphatase: 90 U/L (ref 38–126)
Anion gap: 12 (ref 5–15)
BUN: 18 mg/dL (ref 6–20)
CO2: 25 mmol/L (ref 22–32)
Calcium: 9.9 mg/dL (ref 8.9–10.3)
Chloride: 103 mmol/L (ref 98–111)
Creatinine, Ser: 0.87 mg/dL (ref 0.44–1.00)
GFR, Estimated: >60 mL/min (ref >60)
Glucose, Bld: 85 mg/dL (ref 70–99)
Potassium: 4 mmol/L (ref 3.5–5.1)
Sodium: 140 mmol/L (ref 135–145)
Total Bilirubin: 0.3 mg/dL (ref 0.0–1.2)
Total Protein: 7.1 g/dL (ref 6.5–8.1)

## 2023-11-30 NOTE — ED Provider Notes (Signed)
 [place] EMERGENCY DEPARTMENT AT [facility] Provider Note   CSN: [id number] Arrival date & time: 11/30/23  1203     Patient presents with: Flank Pain   Kristen [name] is a 53 y.o. female here with left flank pain going for 2 days.  Patient had a CT scan performed 2 days ago by her GI team which noted that she had a left subcapsular renal hematoma.  She reports a lithotripsy 2 or 3 weeks ago for an asymptomatic large ureteral stone.  She had not had pain until 2 or 3 days ago.  She now reports discomfort with movement primarily.  She came to the ED last night had blood work done but left due to prolonged waiting times.  She returns today to the ER at the behest of her GI team to reassess this hematoma.  She is not on anticoagulation.   HPI     Prior to Admission medications   Medication Sig Start Date End Date Taking? Authorizing Provider  acetaminophen  (TYLENOL ) 500 MG tablet Take 1,000 mg by mouth every 6 (six) hours as needed for moderate pain.    [provider]  albuterol  (PROVENTIL ) (2.5 MG/3ML) 0.083% nebulizer solution Take 2.5 mg by nebulization every 2 (two) hours as needed for shortness of breath.    [provider]  albuterol  (VENTOLIN  HFA) 108 (90 Base) MCG/ACT inhaler Inhale 1 puff into the lungs every 4 (four) hours as needed.    [provider]  ALPRAZolam  (XANAX ) 1 MG tablet Take 1 tablet (1 mg total) by mouth every 6 (six) hours as needed for anxiety. 10/06/23   Rhys Boyer T, PA-C  busPIRone  (BUSPAR ) 15 MG tablet Take 2 tablets (30 mg total) by mouth 2 (two) times daily. 10/06/23   Rhys Boyer T, PA-C  cefUROXime (CEFTIN) 500 MG tablet Take 1 tablet (500 mg total) by mouth 2 (two) times daily with a meal for 14 days. 11/18/23 12/02/23  [place], PA-C  cetirizine  (ZYRTEC ) 10 MG tablet Take 10 mg by mouth daily.    [provider]  cyanocobalamin  (VITAMIN B12) 1000 MCG/ML injection Inject 1 mL (1,000 mcg total) into  the muscle once a week. 1 ml weekly X 4 and monthly X 6 05/14/23   Geofm Delon BRAVO, NP  Dextromethorphan-buPROPion  ER (AUVELITY ) 45-105 MG TBCR Take 1 tablet by mouth 2 (two) times daily. 10/06/23   Rhys Boyer DASEN, PA-C  dicyclomine  (BENTYL ) 20 MG tablet Take 1 tablet (20 mg total) by mouth 2 (two) times daily. 11/18/23 02/16/24  [place], PA-C  diphenhydrAMINE  (BENADRYL  ALLERGY EXTRA STR) 50 MG tablet Take tablet 1 hour prior to CT 11/19/23   Armbruster, Elspeth SQUIBB, MD  famotidine  (PEPCID ) 40 MG tablet Take 1 tablet (40 mg total) by mouth daily before supper. 10/08/23   [place], PA-C  fenofibrate  (TRICOR ) 145 MG tablet Take 1 tablet (145 mg total) by mouth daily. 05/15/23   Shlomo Wilbert SAUNDERS, MD  fluticasone  (FLONASE ) 50 MCG/ACT nasal spray Place 1 spray into both nostrils daily.    [provider]  hydrOXYzine  (ATARAX ) 50 MG tablet TAKE 1 TABLET BY MOUTH EVERY 6 HOURS AS NEEDED 10/11/23   Rhys Boyer T, PA-C  lamoTRIgine  (LAMICTAL ) 100 MG tablet Take 500 mg by mouth. 11/03/23 02/01/24  [provider]  levothyroxine  (SYNTHROID ) 75 MCG tablet Take 75 mcg by mouth daily.    [provider]  metFORMIN  (GLUCOPHAGE -XR) 750 MG 24 hr tablet Take 750 mg by mouth  daily. 08/11/21   [provider]  ondansetron  (ZOFRAN ) 8 MG tablet Take 1 tablet (8 mg total) by mouth every 8 (eight) hours as needed for nausea or vomiting. 11/29/22   Geofm Delon BRAVO, NP  ondansetron  (ZOFRAN -ODT) 8 MG disintegrating tablet Take 1 tablet (8 mg total) by mouth 2 (two) times daily as needed for nausea or vomiting. 11/09/21   Ezzard Sonny RAMAN, PA-C  Oxcarbazepine  (TRILEPTAL ) 300 MG tablet Take 2 tablets (600 mg total) by mouth 2 (two) times daily. Patient taking differently: Take 600 mg by mouth 2 (two) times daily. 1 po q am, 3 po qhs 07/31/23   Hurst, Verneita T, PA-C  pantoprazole  (PROTONIX ) 40 MG tablet Take 1 tablet (40 mg total) by mouth daily before breakfast. 10/08/23   [place], PA-C   polyethylene glycol (MIRALAX  / GLYCOLAX ) 17 g packet Take 17 g by mouth daily. 11/03/23 02/01/24  [provider]  predniSONE  (DELTASONE ) 50 MG tablet Take 1 tablet by mouth 13 hours prior to CT, take 1 tablet 7 hours prior to CT, take 1 tablet 1 hour prior to CT. 11/19/23   Armbruster, Elspeth SQUIBB, MD  Probiotic Product (ALIGN PO) Take by mouth.    [provider]  rosuvastatin  (CRESTOR ) 5 MG tablet Take 1 tablet (5 mg total) by mouth daily. 05/15/23   Shlomo Wilbert SAUNDERS, MD  RYBELSUS  7 MG TABS Take 1 tablet by mouth daily. 11/17/23   [provider]  sertraline  (ZOLOFT ) 100 MG tablet TAKE 3 TABLETS BY MOUTH AT BEDTIME 11/24/23   Hurst, Teresa T, PA-C  SYRINGE-NEEDLE, DISP, 3 ML (LUER LOCK SAFETY SYRINGES) 23G X 1 3 ML MISC To be used with B 12 injections 09/15/23   Geofm Delon BRAVO, NP  tamsulosin  (FLOMAX ) 0.4 MG CAPS capsule Take 1 capsule (0.4 mg total) by mouth daily. 11/07/23   Devere Lonni Righter, MD  Tenapanor HCl (IBSRELA ) 50 MG TABS Take 1 tablet by mouth 2 (two) times daily. 10/15/23   [place], PA-C  valACYclovir (VALTREX) 500 MG tablet Take 500 mg by mouth daily.    [provider]    Allergies: Iohexol , Rosuvastatin , and Diatrizoate    Review of Systems  Updated Vital Signs BP 124/78 (BP Location: Right Arm)   Pulse 71   Temp 97.7 F (36.5 C) (Oral)   Resp 14   Ht 5' 6 (1.676 m)   Wt 94.9 kg   SpO2 100%   BMI 33.75 kg/m   Physical Exam Constitutional:      General: She is not in acute distress. HENT:     Head: Normocephalic and atraumatic.  Eyes:     Conjunctiva/sclera: Conjunctivae normal.     Pupils: Pupils are equal, round, and reactive to light.  Cardiovascular:     Rate and Rhythm: Normal rate and regular rhythm.  Pulmonary:     Effort: Pulmonary effort is normal. No respiratory distress.  Abdominal:     General: There is no distension.     Tenderness: There is no abdominal tenderness. There is no right CVA tenderness or  left CVA tenderness.  Skin:    General: Skin is warm and dry.  Neurological:     General: No focal deficit present.     Mental Status: She is alert. Mental status is at baseline.  Psychiatric:        Mood and Affect: Mood normal.        Behavior: Behavior normal.     (all labs ordered are  listed, but only abnormal results are displayed) Labs Reviewed  CBC WITH DIFFERENTIAL/PLATELET - Abnormal; Notable for the following components:      Result Value   WBC 11.7 (*)    RBC 3.81 (*)    Hemoglobin 11.0 (*)    HCT 34.0 (*)    Platelets 578 (*)    Monocytes Absolute 1.3 (*)    Abs Immature Granulocytes 0.29 (*)    All other components within normal limits  URINALYSIS, ROUTINE W REFLEX MICROSCOPIC - Abnormal; Notable for the following components:   Color, Urine STRAW (*)    Leukocytes,Ua SMALL (*)    All other components within normal limits  URINALYSIS, MICROSCOPIC (REFLEX) - Abnormal; Notable for the following components:   Bacteria, UA MANY (*)    Non Squamous Epithelial PRESENT (*)    All other components within normal limits  COMPREHENSIVE METABOLIC PANEL WITH GFR    EKG: None  Radiology: No results found.   Procedures   Medications Ordered in the ED - No data to display  Clinical Course as of 11/30/23 1647  Sun Nov 30, 2023  1534 Dr pace urology by phone feels that since the patient's hgb has been stable the past 2 days (and at baseline) and her pain is under control, it's reasonable to discharge home with close office follow up, and not to admit for observation.  There is no indication for emergency surgery or embolization at this time.  This hematoma should likely reabsorb over time per the urologist, but they will expedite office follow up [MT]    Clinical Course User Index [MT] Izzy Doubek, Donnice PARAS, MD                                 Medical Decision Making Amount and/or Complexity of Data Reviewed Labs: ordered.   This is a well-appearing patient who  presents to the ED with a left-sided renal hematoma, subcapsular, in the setting of lithotripsy, likely iatrogenic from her procedure.  Her vital signs are stable.  I reviewed her labs today as well as her prior labs from yesterday and her recent hemoglobin baseline, and it is stable, today at 11.  This is virtually unchanged from her check yesterday.  I did also review her outpatient records including CT imaging with the 9 x 4 cm subcapsular hematoma, which appears to be self-contained.  I discussed the case with urology by phone, see ED course, and they felt it would be reasonable to have the patient follow-up as an outpatient, which would be the patient's preference as well.  They will help make arrangements for her to be seen in the office.     Final diagnoses:  Hematoma of left kidney, subsequent encounter    ED Discharge Orders     None          Chevy Virgo, Donnice PARAS, MD 11/30/23 [phone]

## 2023-11-30 NOTE — Progress Notes (Deleted)
 Ellouise Console, PA-C [address] [place], [place]  [address] Phone: [phone]   Primary Care Physician: Nena Rosina CROME, [place]  Primary Gastroenterologist:  Ellouise Console, PA-C / ***  Chief Complaint: Follow-up left lower quadrant abdominal pain       HPI:   Kristen [name] is a 53 y.o. female  Discussed the use of AI scribe software for clinical note transcription with the patient, who gave verbal consent to proceed.  History of Present Illness      Current Outpatient Medications  Medication Sig Dispense Refill   acetaminophen  (TYLENOL ) 500 MG tablet Take 1,000 mg by mouth every 6 (six) hours as needed for moderate pain.     albuterol  (PROVENTIL ) (2.5 MG/3ML) 0.083% nebulizer solution Take 2.5 mg by nebulization every 2 (two) hours as needed for shortness of breath.     albuterol  (VENTOLIN  HFA) 108 (90 Base) MCG/ACT inhaler Inhale 1 puff into the lungs every 4 (four) hours as needed.     ALPRAZolam  (XANAX ) 1 MG tablet Take 1 tablet (1 mg total) by mouth every 6 (six) hours as needed for anxiety. 120 tablet 5   busPIRone  (BUSPAR ) 15 MG tablet Take 2 tablets (30 mg total) by mouth 2 (two) times daily. 360 tablet 3   cefUROXime (CEFTIN) 500 MG tablet Take 1 tablet (500 mg total) by mouth 2 (two) times daily with a meal for 14 days. 28 tablet 0   cetirizine  (ZYRTEC ) 10 MG tablet Take 10 mg by mouth daily.     cyanocobalamin  (VITAMIN B12) 1000 MCG/ML injection Inject 1 mL (1,000 mcg total) into the muscle once a week. 1 ml weekly X 4 and monthly X 6 10 mL 3   Dextromethorphan-buPROPion  ER (AUVELITY ) 45-105 MG TBCR Take 1 tablet by mouth 2 (two) times daily. 180 tablet 3   dicyclomine  (BENTYL ) 20 MG tablet Take 1 tablet (20 mg total) by mouth 2 (two) times daily. 60 tablet 2   diphenhydrAMINE  (BENADRYL  ALLERGY EXTRA STR) 50 MG tablet Take tablet 1 hour prior to CT 1 tablet 0   famotidine  (PEPCID ) 40 MG tablet Take 1 tablet (40 mg total) by mouth daily before supper. 30  tablet 5   fenofibrate  (TRICOR ) 145 MG tablet Take 1 tablet (145 mg total) by mouth daily. 30 tablet 1   fluticasone  (FLONASE ) 50 MCG/ACT nasal spray Place 1 spray into both nostrils daily.     hydrOXYzine  (ATARAX ) 50 MG tablet TAKE 1 TABLET BY MOUTH EVERY 6 HOURS AS NEEDED 120 tablet 1   lamoTRIgine  (LAMICTAL ) 100 MG tablet Take 500 mg by mouth.     levothyroxine  (SYNTHROID ) 75 MCG tablet Take 75 mcg by mouth daily.     metFORMIN  (GLUCOPHAGE -XR) 750 MG 24 hr tablet Take 750 mg by mouth daily.     ondansetron  (ZOFRAN ) 8 MG tablet Take 1 tablet (8 mg total) by mouth every 8 (eight) hours as needed for nausea or vomiting. 90 tablet 2   ondansetron  (ZOFRAN -ODT) 8 MG disintegrating tablet Take 1 tablet (8 mg total) by mouth 2 (two) times daily as needed for nausea or vomiting. 60 tablet 5   Oxcarbazepine  (TRILEPTAL ) 300 MG tablet Take 2 tablets (600 mg total) by mouth 2 (two) times daily. (Patient taking differently: Take 600 mg by mouth 2 (two) times daily. 1 po q am, 3 po qhs) 360 tablet 0   pantoprazole  (PROTONIX ) 40 MG tablet Take 1 tablet (40 mg total) by mouth daily before breakfast. 30  tablet 5   polyethylene glycol (MIRALAX  / GLYCOLAX ) 17 g packet Take 17 g by mouth daily.     predniSONE  (DELTASONE ) 50 MG tablet Take 1 tablet by mouth 13 hours prior to CT, take 1 tablet 7 hours prior to CT, take 1 tablet 1 hour prior to CT. 3 tablet 0   Probiotic Product (ALIGN PO) Take by mouth.     rosuvastatin  (CRESTOR ) 5 MG tablet Take 1 tablet (5 mg total) by mouth daily. 30 tablet 1   RYBELSUS  7 MG TABS Take 1 tablet by mouth daily.     sertraline  (ZOLOFT ) 100 MG tablet TAKE 3 TABLETS BY MOUTH AT BEDTIME 90 tablet 0   SYRINGE-NEEDLE, DISP, 3 ML (LUER LOCK SAFETY SYRINGES) 23G X 1 3 ML MISC To be used with B 12 injections 12 each 1   tamsulosin  (FLOMAX ) 0.4 MG CAPS capsule Take 1 capsule (0.4 mg total) by mouth daily. 30 capsule 0   Tenapanor HCl (IBSRELA ) 50 MG TABS Take 1 tablet by mouth 2 (two) times  daily. 60 tablet 11   valACYclovir (VALTREX) 500 MG tablet Take 500 mg by mouth daily.     No current facility-administered medications for this visit.    Allergies as of 12/01/2023 - Review Complete 11/30/2023  Allergen Reaction Noted   Iohexol  Hives 11/03/2007   Rosuvastatin  Other (See Comments) 02/09/2020   Diatrizoate Other (See Comments) 02/16/2021    Past Medical History:  Diagnosis Date   Allergies    Anemia    Anxiety    Arthritis    Atypical mole 11/03/2006   mid upper back (slight to moderate)   Atypical mole 11/03/2006   lower right back (slight to moderate)   Back pain    Bipolar 1 disorder (HCC)    Borderline diabetic    Chronic constipation    Depression    Diabetes (HCC)    Diverticulitis    Family history of adverse reaction to anesthesia    mother-- ponv   Fatty liver    GAD (generalized anxiety disorder)    GERD (gastroesophageal reflux disease)    Hiatal hernia    High triglycerides    History of kidney stones    History of recurrent UTIs    History of sepsis 06/2018   History of stomach ulcers    History of suicidal ideation    Hyperlipidemia    Hypothyroidism    IDA (iron  deficiency anemia)    Joint pain    Melanoma (HCC) 11/03/2006   left chest in situ (excision)   Orthostasis    Palpitations    PONV (postoperative nausea and vomiting)    Pre-diabetes    Rapid heart rate    Renal calculus, left    Seasonal allergies    Shortness of breath    Sleep disorder, unspecified    per excessive sleeping during the day   Snoring    Suicide ideation    Swallowing difficulty    Tachycardia    Vertigo    Vitamin D  deficiency    Weakness    Wears contact lenses     Past Surgical History:  Procedure Laterality Date   ANTERIOR CERVICAL DECOMP/DISCECTOMY FUSION  02/17/2012   Procedure: ANTERIOR CERVICAL DECOMPRESSION/DISCECTOMY FUSION 2 LEVELS;  Surgeon: Lamar LELON Peaches, MD;  Location: [facility];  Service: Neurosurgery;  Laterality:  N/A;  Cervical four-five and Cervical six-seven anterior cervial decompression with fusion plating and bonegraft   ANTERIOR CERVICAL DECOMP/DISCECTOMY FUSION  04-01-2001   @  [facility]   C5---6   BIOPSY  07/20/2018   Procedure: BIOPSY;  Surgeon: Shaaron Lamar HERO, MD;  Location: [facility];  Service: Endoscopy;;  gastric    BREAST REDUCTION SURGERY Bilateral 1995   CESAREAN SECTION  x2   last one 1998   with BILATERAL TUBAL LIGATION   CESAREAN SECTION WITH BILATERAL TUBAL LIGATION     COLONOSCOPY WITH PROPOFOL  N/A 07/20/2018   Dr. Shaaron: Diverticulosis, internal hemorrhoids, 5 mm splenic flexure tubular adenoma removed. Repeat in 5 years due to family history.   COLONOSCOPY WITH PROPOFOL  N/A 12/07/2020   Procedure: COLONOSCOPY WITH PROPOFOL ;  Surgeon: Shaaron Lamar HERO, MD;  Location: [facility];  Service: Endoscopy;  Laterality: N/A;  2:30pm   CYSTOSCOPY/URETEROSCOPY/HOLMIUM LASER/STENT PLACEMENT Left 08/21/2018   Procedure: CYSTOSCOPY LEFT RETROGRADE PYELOGRAM /URETEROSCOPY/HOLMIUM LASER/STENT PLACEMENT;  Surgeon: Devere Lonni Righter, MD;  Location: [facility];  Service: Urology;  Laterality: Left;   DILATION AND CURETTAGE OF UTERUS  1992   for miscarriage   ENDOMETRIAL ABLATION  2014   ESOPHAGOGASTRODUODENOSCOPY     20 years ago.    ESOPHAGOGASTRODUODENOSCOPY (EGD) WITH PROPOFOL  N/A 07/20/2018   Dr. Shaaron: Small hiatal hernia, erythematous mucosa in the stomach with a healing gastric ulcer measuring 1 cm, biopsies negative.   ESOPHAGOGASTRODUODENOSCOPY (EGD) WITH PROPOFOL  N/A 10/05/2020   Procedure: ESOPHAGOGASTRODUODENOSCOPY (EGD) WITH PROPOFOL ;  Surgeon: Shaaron Lamar HERO, MD;  [facility]: [facility];  Service: Endoscopy;  Laterality: N/A;   EXTRACORPOREAL SHOCK WAVE LITHOTRIPSY Left 11/07/2023   Procedure: LITHOTRIPSY, ESWL;  Surgeon: Devere Lonni Righter, MD;  Location: [facility];  Service: Urology;  Laterality: Left;  LEFT EXTRACORPOREAL SHOCKWAVE LITHOTRIPSY    MALONEY DILATION N/A 10/05/2020   Procedure: MALONEY DILATION;  Surgeon: Shaaron Lamar HERO, MD;  Location: [facility];  Service: Endoscopy;  Laterality: N/A;   MICROTUBOPLASTY  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   POLYPECTOMY  07/20/2018   Procedure: POLYPECTOMY;  Surgeon: Shaaron Lamar HERO, MD;  Location: [facility];  Service: Endoscopy;;   POSTERIOR CERVICAL FUSION/FORAMINOTOMY N/A 08/18/2013   Procedure: CERVICAL FOUR TO CERVICAL SEVEN POSTERIOR CERVICAL FUSION/FORAMINOTOMY LEVEL 3;  Surgeon: Lamar LELON Peaches, MD;  Location: [facility];  Service: Neurosurgery;  Laterality: N/A;  C4-7 posterior cervical fusion with lateral mass fixation   RIGHT/LEFT HEART CATH AND CORONARY ANGIOGRAPHY N/A 12/23/2019   Procedure: RIGHT/LEFT HEART CATH AND CORONARY ANGIOGRAPHY;  Surgeon: Verlin Lonni BIRCH, MD;  Location: [facility];  Service: Cardiovascular;  Laterality: N/A;    Review of Systems:    All systems reviewed and negative except where noted in HPI.    Physical Exam:  There were no vitals taken for this visit. No LMP recorded. Patient has had an ablation.  General: Well-nourished, well-developed in no acute distress.  Lungs: Clear to auscultation bilaterally. Non-labored. Heart: Regular rate and rhythm, no murmurs rubs or gallops.  Abdomen: Bowel sounds are normal; Abdomen is Soft; No hepatosplenomegaly, masses or hernias;  No Abdominal Tenderness; No guarding or rebound tenderness. Neuro: Alert and oriented x 3.  Grossly intact.  Psych: Alert and cooperative, normal mood and affect.   Imaging Studies: CT ABDOMEN PELVIS W CONTRAST Result Date: 11/29/2023 CLINICAL DATA:  Abdominal pain.  Recurrent diverticulitis. EXAM: CT ABDOMEN AND PELVIS WITH CONTRAST TECHNIQUE: Multidetector CT imaging of the abdomen and pelvis was performed using the standard protocol following bolus administration of intravenous contrast. RADIATION DOSE REDUCTION:  This  exam was performed according to the departmental dose-optimization program which includes automated exposure control, adjustment of the mA and/or kV according to patient size and/or use of iterative reconstruction technique. CONTRAST:  OMNIPAQUE  IOHEXOL  300 MG/ML  SOLN COMPARISON:  10/30/2023 FINDINGS: Lower Chest: No acute findings. Hepatobiliary: No suspicious hepatic masses identified. Gallbladder is unremarkable. No evidence of biliary ductal dilatation. Pancreas:  No mass or inflammatory changes. Spleen: Within normal limits in size and appearance. Adrenals/Urinary Tract: New large left renal subcapsular hematoma is seen measuring 10.5 x 3.8 cm. No suspicious renal masses are identified. A few tiny punctate renal calculi are seen bilaterally. No evidence of ureteral calculi or hydronephrosis. Unremarkable unopacified urinary bladder. Stomach/Bowel: No evidence of obstruction, inflammatory process or abnormal fluid collections. Normal appendix visualized. Diverticulosis is seen mainly involving the descending and sigmoid colon, however there is no evidence of diverticulitis. Vascular/Lymphatic: No pathologically enlarged lymph nodes. No acute vascular findings. Reproductive: Mildly enlarged uterus with lobulated contour again seen, consistent with presence of small uterine fibroids. Adnexal regions are unremarkable. Other:  None. Musculoskeletal:  No suspicious bone lesions identified. IMPRESSION: New large left renal subcapsular hematoma. Tiny bilateral renal calculi. No evidence of ureteral calculi or hydronephrosis. Colonic diverticulosis, without radiographic evidence of diverticulitis. Stable small uterine fibroids. These results will be called to the ordering clinician or representative by the Radiologist Assistant, and communication documented in the PACS or [employer]. Electronically Signed   By: Norleen DELENA Kil M.D.   On: 11/29/2023 17:29   DG Abd 1 View Result Date: 11/07/2023 CLINICAL  DATA:  Left renal stone EXAM: ABDOMEN - 1 VIEW COMPARISON:  CT abdomen pelvis October 30, 2023 FINDINGS: The bowel gas pattern is normal. Left kidney lower pole nephrolithiasis measuring 9 mm. Sclerotic osseous lesion overlying the left acetabulum likely bone island. Mild dextroconvex curvature of the spine. Transitional vertebrae identified IMPRESSION: Left kidney lower pole nephrolithiasis. Electronically Signed   By: Megan  Zare M.D.   On: 11/07/2023 14:39    Labs: CBC    Component Value Date/Time   WBC 11.7 (H) 11/30/2023 1318   RBC 3.81 (L) 11/30/2023 1318   HGB 11.0 (L) 11/30/2023 1318   HGB 12.0 06/06/2022 1029   HGB 12.9 12/17/2019 1052   HCT 34.0 (L) 11/30/2023 1318   HCT 39.0 12/17/2019 1052   PLT 578 (H) 11/30/2023 1318   PLT 445 (H) 06/06/2022 1029   PLT 425 12/17/2019 1052   MCV 89.2 11/30/2023 1318   MCV 86 12/17/2019 1052   MCH 28.9 11/30/2023 1318   MCHC 32.4 11/30/2023 1318   RDW 14.7 11/30/2023 1318   RDW 13.5 12/17/2019 1052   LYMPHSABS 2.7 11/30/2023 1318   LYMPHSABS 3.0 12/17/2019 1052   MONOABS 1.3 (H) 11/30/2023 1318   EOSABS 0.2 11/30/2023 1318   EOSABS 0.8 (H) 12/17/2019 1052   BASOSABS 0.1 11/30/2023 1318   BASOSABS 0.1 12/17/2019 1052    CMP     Component Value Date/Time   NA 140 11/30/2023 1318   NA 137 12/17/2019 1052   K 4.0 11/30/2023 1318   CL 103 11/30/2023 1318   CO2 25 11/30/2023 1318   GLUCOSE 85 11/30/2023 1318   BUN 18 11/30/2023 1318   BUN 19 12/17/2019 1052   CREATININE 0.87 11/30/2023 1318   CREATININE 1.05 (H) 06/06/2022 1029   CREATININE 1.09 (H) 08/02/2020 1049   CALCIUM  9.9 11/30/2023 1318   CALCIUM  9.6 06/10/2019 1539   PROT 7.1 11/30/2023 1318   PROT 7.1 05/12/2020 1117  ALBUMIN 4.7 11/30/2023 1318   ALBUMIN 4.6 05/12/2020 1117   AST 24 11/30/2023 1318   AST 24 06/06/2022 1029   ALT 15 11/30/2023 1318   ALT 17 06/06/2022 1029   ALKPHOS 90 11/30/2023 1318   BILITOT 0.3 11/30/2023 1318   BILITOT 0.4 06/06/2022  1029   GFRNONAA >60 11/30/2023 1318   GFRNONAA >60 06/06/2022 1029   GFRNONAA 59 (L) 08/02/2020 1049   GFRAA 69 08/02/2020 1049       Assessment and Plan:   ARTIA SINGLEY is a 53 y.o. y/o female ***  Assessment and Plan Assessment & Plan       Ellouise Console, PA-C  Follow up ***

## 2023-11-30 NOTE — Telephone Encounter (Signed)
 Patient called answering service this am to discuss the large renal subcapsular hematoma on CT scan. Report was called to our on call provider last night who advised patient to go to ED. She went to [facility] ED, did get labs but wait was too long so she left without seeing a provider. She wants to know if okay to wait until she sees Ellouise Console, [place] tomorrow in our office.   Reviewed yesterday's CBC. Hgb is thankfully stable. She doesn't have sever pain. Feels okay. She is no anti-coagulated. Had lithotripsy to that kidney a few weeks ago.   I advised patient to return to the ED today and see a provider. May try [facility] ( wait time might be less).

## 2023-11-30 NOTE — Discharge Instructions (Signed)
 If your pain worsens, or you begin to feel dizzy, lightheaded, short of breath, or any other emergency concerns, please return immediately to ER.

## 2023-11-30 NOTE — ED Triage Notes (Signed)
 Pt had CT 10/17 that showed hematoma on LT kidney; was instructed to go to ED; LWBS at [facility] yesterday

## 2023-12-01 ENCOUNTER — Encounter: Payer: Self-pay | Admitting: Oncology

## 2023-12-01 ENCOUNTER — Ambulatory Visit: Admitting: Physician Assistant

## 2023-12-04 ENCOUNTER — Encounter: Payer: Self-pay | Admitting: Oncology

## 2023-12-08 ENCOUNTER — Telehealth: Payer: Self-pay | Admitting: Physician Assistant

## 2023-12-08 NOTE — Telephone Encounter (Signed)
 Pt seen 8/25: Continue Xanax  1 mg, 1 po qid prn.  Continue Buspar  15 mg, to 2 po twice daily. Continue Auvelity  45-105 mg, 1 p.o. twice daily.   Continue hydroxyzine  to 50 mg, 1 p.o. every 6 hours as needed anxiety.   Continue Lamictal  200 mg, 2.5 pills daily. Continue Trileptal  300 mg, 1 in the morning, and 3 at night.  Continue Zoloft  100 mg, 3 p.o. daily. Continue vitamins as per med list.  Add biotin daily. Return in 6 weeks.  She has appt 10/31 but said this can't wait. Rating depression and anxiety at 10/10. Called out of work today due to PA that have been all day.  She is reporting no energy and EVERYTHING makes her agitated. Reports her husband and coworkers are commenting that something is off and suggests she see you. She is not having SI and said she has been talking to her therapist about her son and things there are good.   She is taking medications as above, but said this AM she did not take the Trileptal , feels like she could be taking too much.   [facility] in [place].

## 2023-12-08 NOTE — Telephone Encounter (Signed)
 Pt called and said that her meds are all messed up and she needs changed or increased. She has an appt on 10/31. She is on the wait list for something sooner. Please call her at [phone]

## 2023-12-09 NOTE — Telephone Encounter (Signed)
 Have her increase the Zoloft  to a total of 400 mg daily. Make sure she's in counseling.  Continue all other meds.   For the record, this is higher than usual FDA recommended dose, however per Dr. Garnette Space prescribers guide, some patients require this dose to help w/ anx, dep, and OCD.

## 2023-12-09 NOTE — Telephone Encounter (Signed)
 Recommendations reviewed with patient. She is in counseling.

## 2023-12-10 ENCOUNTER — Other Ambulatory Visit: Payer: Self-pay | Admitting: Physician Assistant

## 2023-12-12 ENCOUNTER — Encounter: Payer: Self-pay | Admitting: Physician Assistant

## 2023-12-12 ENCOUNTER — Telehealth (INDEPENDENT_AMBULATORY_CARE_PROVIDER_SITE_OTHER): Admitting: Physician Assistant

## 2023-12-12 DIAGNOSIS — F3160 Bipolar disorder, current episode mixed, unspecified: Secondary | ICD-10-CM | POA: Diagnosis not present

## 2023-12-12 DIAGNOSIS — F5105 Insomnia due to other mental disorder: Secondary | ICD-10-CM

## 2023-12-12 DIAGNOSIS — F411 Generalized anxiety disorder: Secondary | ICD-10-CM | POA: Diagnosis not present

## 2023-12-12 DIAGNOSIS — R5381 Other malaise: Secondary | ICD-10-CM | POA: Diagnosis not present

## 2023-12-12 DIAGNOSIS — R5383 Other fatigue: Secondary | ICD-10-CM

## 2023-12-12 DIAGNOSIS — F99 Mental disorder, not otherwise specified: Secondary | ICD-10-CM

## 2023-12-12 MED ORDER — SERTRALINE HCL 100 MG PO TABS: 300.0000 mg | ORAL_TABLET | Freq: Every day | ORAL | 3 refills | Status: AC

## 2023-12-12 MED ORDER — OLANZAPINE 5 MG PO TABS: ORAL_TABLET | ORAL | 1 refills | Status: DC

## 2023-12-12 NOTE — Progress Notes (Signed)
 [facility]  Patient ID: Kristen [name],  MRN: [id number]  PCP: Nena Rosina LITTIE DEVONNA  Date of Evaluation: 12/12/2023 Time spent:30 minutes  Chief Complaint:  Chief Complaint   Depression; Anxiety    Virtual Visit via Telehealth  I connected with patient by a video enabled telemedicine application with their informed consent, and verified patient privacy and that I am speaking with the correct person using two identifiers.  I am private, in my office and the patient is at home.  I discussed the limitations, risks, security and privacy concerns of performing an evaluation and management service by video and the availability of in person appointments. I also discussed with the patient that there may be a patient responsible charge related to this service. The patient expressed understanding and agreed to proceed.   I discussed the assessment and treatment plan with the patient. The patient was provided an opportunity to ask questions and all were answered. The patient agreed with the plan and demonstrated an understanding of the instructions.   The patient was advised to call back or seek an in-person evaluation if the symptoms worsen or if the condition fails to improve as anticipated.  I provided 30 minutes of non-face-to-face time during this encounter.  HISTORY/CURRENT STATUS: HPI For routine med check.  Very angry and irritable all the time.  This started approximately 2 weeks ago.  I think something is off with my medicine.  She stopped the Trileptal  morning dose about 10 days ago, thinking it might be causing daytime sleepiness but it didn't help.  She was already having manic sx before she decreased that dose, and the anger and irritability did not worsen with that dose decrease.  She recently bought $1200 worth of clothes, more impulsive, ordered a laptop, printer, mouse etc. During the night and didn't know she did it until the next morning. Eating in her  sleep.  No grandiosity.  No increased libido.  No paranoia.  No AH/VH.  Then mood can turn on a dime.  She cries easily.  Her energy is very low.  She wants to sleep all the time.  Appetite is decreased.  ADLs decreased.  Personal hygiene is normal.  She works her part-time job at [employer] but she has had to call out a few times because she cannot get up and get going.  Focus and attention are bad.  No suicidal or homicidal thoughts.  Review of Systems  Constitutional:  Positive for malaise/fatigue.  Psychiatric/Behavioral:         See HPI  All other systems reviewed and are negative.  Was admitted 11/02/2023 for acute diverticulitis. Will be having sigmoid colon out Dec 10th.   Also had a kidney stone.  Had lithotripsy, then developed a hematoma, had to get iron  infusions.   Individual Medical History/ Review of Systems: Changes? :Yes    see HPI and notes on chart.   Past medications for mental health diagnoses include: Prozac , Paxil, Lexapro, Celexa, Zoloft , Viibryd , Effexor, Cymbalta , Risperdal , Latuda , Lamictal , VPA, Lithium -became toxic, Xanax , Wellbutrin , Abilify , Traz, Strattera, Kapvay, Saphris, Adderall, Vyvanse , Lunesta, Sonata ,  Vraylar, Seroquel , Geodon, Tegretal  Allergies: Iohexol , Rosuvastatin , and Diatrizoate  Current Medications:  Current Outpatient Medications:    acetaminophen  (TYLENOL ) 500 MG tablet, Take 1,000 mg by mouth every 6 (six) hours as needed for moderate pain., Disp: , Rfl:    albuterol  (PROVENTIL ) (2.5 MG/3ML) 0.083% nebulizer solution, Take 2.5 mg by nebulization every 2 (two) hours as needed for shortness  of breath., Disp: , Rfl:    albuterol  (VENTOLIN  HFA) 108 (90 Base) MCG/ACT inhaler, Inhale 1 puff into the lungs every 4 (four) hours as needed., Disp: , Rfl:    ALPRAZolam  (XANAX ) 1 MG tablet, Take 1 tablet (1 mg total) by mouth every 6 (six) hours as needed for anxiety., Disp: 120 tablet, Rfl: 5   busPIRone  (BUSPAR ) 15 MG tablet, Take 2 tablets (30 mg  total) by mouth 2 (two) times daily., Disp: 360 tablet, Rfl: 3   cetirizine  (ZYRTEC ) 10 MG tablet, Take 10 mg by mouth daily., Disp: , Rfl:    cyanocobalamin  (VITAMIN B12) 1000 MCG/ML injection, Inject 1 mL (1,000 mcg total) into the muscle once a week. 1 ml weekly X 4 and monthly X 6 (Patient taking differently: Inject 1,000 mcg into the muscle once a week.  monthly), Disp: 10 mL, Rfl: 3   Dextromethorphan-buPROPion  ER (AUVELITY ) 45-105 MG TBCR, Take 1 tablet by mouth 2 (two) times daily., Disp: 180 tablet, Rfl: 3   dicyclomine  (BENTYL ) 20 MG tablet, Take 1 tablet (20 mg total) by mouth 2 (two) times daily., Disp: 60 tablet, Rfl: 2   diphenhydrAMINE  (BENADRYL  ALLERGY EXTRA STR) 50 MG tablet, Take tablet 1 hour prior to CT, Disp: 1 tablet, Rfl: 0   famotidine  (PEPCID ) 40 MG tablet, Take 1 tablet (40 mg total) by mouth daily before supper., Disp: 30 tablet, Rfl: 5   fenofibrate  (TRICOR ) 145 MG tablet, Take 1 tablet (145 mg total) by mouth daily., Disp: 30 tablet, Rfl: 1   fluticasone  (FLONASE ) 50 MCG/ACT nasal spray, Place 1 spray into both nostrils daily., Disp: , Rfl:    hydrOXYzine  (ATARAX ) 50 MG tablet, TAKE 1 TABLET BY MOUTH EVERY 6 HOURS AS NEEDED, Disp: 120 tablet, Rfl: 1   lamoTRIgine  (LAMICTAL ) 100 MG tablet, Take 500 mg by mouth., Disp: , Rfl:    levothyroxine  (SYNTHROID ) 75 MCG tablet, Take 75 mcg by mouth daily., Disp: , Rfl:    metFORMIN  (GLUCOPHAGE -XR) 750 MG 24 hr tablet, Take 750 mg by mouth daily., Disp: , Rfl:    OLANZapine (ZYPREXA) 5 MG tablet, 1 po at bedtime for 2 nights, then 2 po at bedtime., Disp: 60 tablet, Rfl: 1   ondansetron  (ZOFRAN ) 8 MG tablet, Take 1 tablet (8 mg total) by mouth every 8 (eight) hours as needed for nausea or vomiting., Disp: 90 tablet, Rfl: 2   ondansetron  (ZOFRAN -ODT) 8 MG disintegrating tablet, Take 1 tablet (8 mg total) by mouth 2 (two) times daily as needed for nausea or vomiting., Disp: 60 tablet, Rfl: 5   Oxcarbazepine  (TRILEPTAL ) 300 MG tablet,  Take 2 tablets (600 mg total) by mouth 2 (two) times daily. (Patient taking differently: Take 900 mg by mouth at bedtime.), Disp: 360 tablet, Rfl: 0   pantoprazole  (PROTONIX ) 40 MG tablet, Take 1 tablet (40 mg total) by mouth daily before breakfast., Disp: 30 tablet, Rfl: 5   polyethylene glycol (MIRALAX  / GLYCOLAX ) 17 g packet, Take 17 g by mouth daily., Disp: , Rfl:    Probiotic Product (ALIGN PO), Take by mouth., Disp: , Rfl:    rosuvastatin  (CRESTOR ) 5 MG tablet, Take 1 tablet (5 mg total) by mouth daily., Disp: 30 tablet, Rfl: 1   RYBELSUS  7 MG TABS, Take 1 tablet by mouth daily., Disp: , Rfl:    SYRINGE-NEEDLE, DISP, 3 ML (LUER LOCK SAFETY SYRINGES) 23G X 1 3 ML MISC, To be used with B 12 injections, Disp: 12 each, Rfl: 1   Tenapanor HCl (  IBSRELA ) 50 MG TABS, Take 1 tablet by mouth 2 (two) times daily., Disp: 60 tablet, Rfl: 11   valACYclovir (VALTREX) 500 MG tablet, Take 500 mg by mouth daily., Disp: , Rfl:    predniSONE  (DELTASONE ) 50 MG tablet, Take 1 tablet by mouth 13 hours prior to CT, take 1 tablet 7 hours prior to CT, take 1 tablet 1 hour prior to CT., Disp: 3 tablet, Rfl: 0   sertraline  (ZOLOFT ) 100 MG tablet, Take 3 tablets (300 mg total) by mouth at bedtime., Disp: 270 tablet, Rfl: 3   tamsulosin  (FLOMAX ) 0.4 MG CAPS capsule, Take 1 capsule (0.4 mg total) by mouth daily. (Patient not taking: Reported on 12/12/2023), Disp: 30 capsule, Rfl: 0 Medication Side Effects: Sweating  Family Medical/ Social History: Changes?  No  MENTAL HEALTH EXAM:  There were no vitals taken for this visit.There is no height or weight on file to calculate BMI.  General Appearance: Casual and Well Groomed  Eye Contact:  Good  Speech:  Clear and Coherent and Normal Rate  Volume:  Normal  Mood:  Irritable  Affect:  Congruent  Thought Process:  Goal Directed and Descriptions of Associations: Circumstantial  Orientation:  Full (Time, Place, and Person)  Thought Content: Logical   Suicidal Thoughts:   No  Homicidal Thoughts:  No  Memory:  WNL  Judgement:  Good  Insight:  Good  Psychomotor Activity:  Normal  Concentration:  Concentration: Good and Attention Span: Good  Recall:  Good  Fund of Knowledge: Good  Language: Good  Assets:  Communication Skills Desire for Improvement Financial Resources/Insurance Housing Transportation Vocational/Educational  ADL's:  Intact  Cognition: WNL  Prognosis:  Good   Gene site test results are under labs dated 04/03/2021.  DIAGNOSES:    ICD-10-CM   1. Mixed bipolar I disorder (HCC)  F31.60     2. Insomnia due to other mental disorder  F51.05    F99     3. Generalized anxiety disorder  F41.1     4. Malaise and fatigue  R53.81    R53.83       Receiving Psychotherapy: Yes    Rocky Andreas in the past.   RECOMMENDATIONS:  PDMP was reviewed.  Last Xanax  filled 11/14/2023.  Small amount of oxycodone  given twice in September. I provided approximately 30 minutes of non-face-to-face time during this encounter, including time spent before and after the visit in records review, medical decision making, counseling pertinent to today's visit, and charting.   We discussed the mixed, rapid cycling bipolar disorder.  I recommend adding Zyprexa.  She has been on numerous other mood stabilizers, antipsychotics and lithium  but never Zyprexa.  Explained the benefits, risk and side effects.  She already has frequent labs by her PCP monitoring lipids and glucose.  Zyprexa usually works quickly for depression and manic symptoms and is also beneficial for anxiety.  Sleep hygiene was discussed.  Put away her phone 2 hours or more prior to the time she wants to go to sleep.  Continue Xanax  1 mg, 1 po qid prn.  Continue Buspar  15 mg, to 2 po twice daily. Continue Auvelity  45-105 mg, 1 p.o. twice daily.   Continue hydroxyzine  to 50 mg, 1 p.o. every 6 hours as needed anxiety.   Continue Lamictal  200 mg, 2.5 pills daily. Start Zyprexa 5 mg, 1 p.o. nightly for 2  nights and then increase to 2 p.o. nightly. Continue Trileptal  300 mg, 3 at night.  Continue Zoloft  100 mg, 3 p.o.  daily. Continue vitamins as per med list.  Add biotin daily. Continue therapy. Return in 4-6 weeks.  Verneita Cooks, PA-C

## 2023-12-18 ENCOUNTER — Other Ambulatory Visit: Payer: Self-pay | Admitting: Physician Assistant

## 2023-12-19 ENCOUNTER — Inpatient Hospital Stay

## 2023-12-23 ENCOUNTER — Telehealth: Payer: Self-pay | Admitting: Physician Assistant

## 2023-12-23 NOTE — Telephone Encounter (Signed)
 Pt said she just left a msg and needed to add to it but I dont see original msg.    States the new medication is making her overly hyper, energetic, over productive, making her talk excessively and some other issues have worsened.

## 2023-12-23 NOTE — Telephone Encounter (Signed)
 Patient lvm at 2:05 stating that new medication that she started a week ago is working for anger irritability, anxiety, and agitation but has not helped with impulse shopping or sleeping. Feels like she go out and buy anything possible. Pls rtc [phone]

## 2023-12-23 NOTE — Telephone Encounter (Signed)
 Please see message from patient. Last seen 10/31.  Continue Xanax  1 mg, 1 po qid prn.  Continue Buspar  15 mg, to 2 po twice daily. Continue Auvelity  45-105 mg, 1 p.o. twice daily.   Continue hydroxyzine  to 50 mg, 1 p.o. every 6 hours as needed anxiety.   Continue Lamictal  200 mg, 2.5 pills daily. Start Zyprexa 5 mg, 1 p.o. nightly for 2 nights and then increase to 2 p.o. nightly. Continue Trileptal  300 mg, 3 at night.  Continue Zoloft  100 mg, 3 p.o. daily. Continue vitamins as per med list.  Add biotin daily. Continue therapy. Return in 4-6 weeks.

## 2023-12-24 NOTE — Telephone Encounter (Signed)
 I think she's experiencing uncontrolled mania, not that the Zyprexa is causing those sx.  Increase to 15 mg. If she needs a new Rx, please send in 15 mg #30, 1 at bedtime.

## 2023-12-24 NOTE — Telephone Encounter (Signed)
 Pt called this morning and said to disregard msg from yesterday. She thinks she just needed to let the medication get in her system. She got a good nights rest and is feeling better.

## 2023-12-26 ENCOUNTER — Inpatient Hospital Stay: Admitting: Oncology

## 2023-12-30 ENCOUNTER — Other Ambulatory Visit: Payer: Self-pay | Admitting: Physician Assistant

## 2023-12-31 ENCOUNTER — Telehealth: Payer: Self-pay | Admitting: Physician Assistant

## 2023-12-31 NOTE — Telephone Encounter (Signed)
 Pt was recently put on new med she is having a SE of constipation. She is coming off of the med Olanzapine. She said she already takes medication for constipation and is going to stop the Olanzapine and doesn't need anything in its place.

## 2024-01-01 NOTE — Telephone Encounter (Signed)
FYI  Please see message

## 2024-01-02 NOTE — Telephone Encounter (Signed)
 Noted

## 2024-01-09 NOTE — Progress Notes (Signed)
 Sent message, via epic in basket, requesting orders in epic from [facility].

## 2024-01-13 ENCOUNTER — Ambulatory Visit: Payer: Self-pay | Admitting: Surgery

## 2024-01-13 ENCOUNTER — Encounter (HOSPITAL_COMMUNITY)

## 2024-01-13 DIAGNOSIS — Z01818 Encounter for other preprocedural examination: Secondary | ICD-10-CM

## 2024-01-14 ENCOUNTER — Other Ambulatory Visit: Payer: Self-pay | Admitting: Physician Assistant

## 2024-01-14 NOTE — Telephone Encounter (Signed)
 Needs an appt, sent MyChart msg.

## 2024-01-15 NOTE — Patient Instructions (Signed)
 SURGICAL WAITING ROOM VISITATION  Patients having surgery or a procedure may have no more than 2 support people in the waiting area - these visitors may rotate.    Children under the age of 27 must have an adult with them who is not the patient.  Visitors with respiratory illnesses are discouraged from visiting and should remain at home.  If the patient needs to stay at the hospital during part of their recovery, the visitor guidelines for inpatient rooms apply. Pre-op nurse will coordinate an appropriate time for 1 support person to accompany patient in pre-op.  This support person may not rotate.    Please refer to the [facility] website for the visitor guidelines for Inpatients (after your surgery is over and you are in a regular room).    Your procedure is scheduled on: 01/21/24   Report to [facility] Main Entrance    Report to admitting at 6:15 AM   Call this number if you have problems the morning of surgery [phone]   Follow a clear liquid diet the day before surgery.   May have clear liquids up until 5:30 AM morning of surgery, nothing to drink after that.  Water  Non-Citrus Juices (without pulp, NO RED-Apple, White grape, White cranberry) Black Coffee (NO MILK/CREAM OR CREAMERS, sugar ok)  Clear Tea (NO MILK/CREAM OR CREAMERS, sugar ok) regular and decaf                             Plain Jell-O (NO RED)                                           Fruit ices (not with fruit pulp, NO RED)                                     Popsicles (NO RED)                                                               Sports drinks like Gatorade (NO RED)              Drink 2 G2 drinks AT 10:00 PM the night before surgery.        The day of surgery:  Drink ONE (1) Pre-Surgery G2 at 5:30 AM the morning of surgery. Drink in one sitting. Do not sip.  This drink was given to you during your hospital  pre-op appointment visit. Nothing else to drink after completing the   Pre-Surgery G2.          If you have questions, please contact your surgeon's office.   FOLLOW BOWEL PREP AND ANY ADDITIONAL PRE OP INSTRUCTIONS YOU RECEIVED FROM YOUR SURGEON'S OFFICE!!!     Oral Hygiene is also important to reduce your risk of infection.                                    Remember - BRUSH YOUR TEETH THE MORNING OF SURGERY WITH  YOUR REGULAR TOOTHPASTE  DENTURES WILL BE REMOVED PRIOR TO SURGERY PLEASE DO NOT APPLY Poly grip OR ADHESIVES!!!   Stop all vitamins and herbal supplements 7 days before surgery.   Take these medicines the morning of surgery with A SIP OF WATER : Tylenol , Inhalers, Alprazolam , Buspirone , Fenofibrate , Allegra, Levothyroxine , Zofran , Pantoprazole    DO NOT TAKE ANY ORAL DIABETIC MEDICATIONS DAY OF YOUR SURGERY  How to Manage Your Diabetes Before and After Surgery  Why is it important to control my blood sugar before and after surgery? Improving blood sugar levels before and after surgery helps healing and can limit problems. A way of improving blood sugar control is eating a healthy diet by:  Eating less sugar and carbohydrates  Increasing activity/exercise  Talking with your doctor about reaching your blood sugar goals High blood sugars (greater than 180 mg/dL) can raise your risk of infections and slow your recovery, so you will need to focus on controlling your diabetes during the weeks before surgery. Make sure that the doctor who takes care of your diabetes knows about your planned surgery including the date and location.  How do I manage my blood sugar before surgery? Check your blood sugar at least 4 times a day, starting 2 days before surgery, to make sure that the level is not too high or low. Check your blood sugar the morning of your surgery when you wake up and every 2 hours until you get to the Short Stay unit. If your blood sugar is less than 70 mg/dL, you will need to treat for low blood sugar: Do not take insulin. Treat a  low blood sugar (less than 70 mg/dL) with  cup of clear juice (cranberry or apple), 4 glucose tablets, OR glucose gel. Recheck blood sugar in 15 minutes after treatment (to make sure it is greater than 70 mg/dL). If your blood sugar is not greater than 70 mg/dL on recheck, call [phone] for further instructions. Report your blood sugar to the short stay nurse when you get to Short Stay.  If you are admitted to the hospital after surgery: Your blood sugar will be checked by the staff and you will probably be given insulin after surgery (instead of oral diabetes medicines) to make sure you have good blood sugar levels. The goal for blood sugar control after surgery is 80-180 mg/dL.   WHAT DO I DO ABOUT MY DIABETES MEDICATION?  Do not take oral diabetes medicines (pills) the morning of surgery.  Reviewed and Endorsed by [facility] Patient Education Committee, August 2015                              You may not have any metal on your body including hair pins, jewelry, and body piercing             Do not wear make-up, lotions, powders, perfumes, or deodorant  Do not wear nail polish including gel and S&S, artificial/acrylic nails, or any other type of covering on natural nails including finger and toenails. If you have artificial nails, gel coating, etc. that needs to be removed by a nail salon please have this removed prior to surgery or surgery may need to be canceled/ delayed if the surgeon/ anesthesia feels like they are unable to be safely monitored.   Do not shave  48 hours prior to surgery.    Do not bring valuables to the hospital. [place] [facility] IS NOT  RESPONSIBLE   FOR VALUABLES.   Contacts, glasses, dentures or bridgework may not be worn into surgery.   Bring small overnight bag day of surgery.   DO NOT BRING YOUR HOME MEDICATIONS TO THE HOSPITAL. PHARMACY WILL DISPENSE MEDICATIONS LISTED ON YOUR MEDICATION LIST TO YOU DURING YOUR ADMISSION IN THE HOSPITAL!               Please read over the following fact sheets you were given: IF YOU HAVE QUESTIONS ABOUT YOUR PRE-OP INSTRUCTIONS PLEASE CALL [phone].   If you received a COVID test during your pre-op visit  it is requested that you wear a mask when out in public, stay away from anyone that may not be feeling well and notify your surgeon if you develop symptoms. If you test positive for Covid or have been in contact with anyone that has tested positive in the last 10 days please notify you surgeon.    [facility] - Preparing for Surgery Before surgery, you can play an important role.  Because skin is not sterile, your skin needs to be as free of germs as possible.  You can reduce the number of germs on your skin by washing with CHG (chlorahexidine gluconate) soap before surgery.  CHG is an antiseptic cleaner which kills germs and bonds with the skin to continue killing germs even after washing. Please DO NOT use if you have an allergy to CHG or antibacterial soaps.  If your skin becomes reddened/irritated stop using the CHG and inform your nurse when you arrive at Short Stay. Do not shave (including legs and underarms) for at least 48 hours prior to the first CHG shower.  You may shave your face/neck.  Please follow these instructions carefully:  1.  Shower with CHG Soap the night before surgery ONLY (DO NOT USE THE SOAP THE MORNING OF SURGERY).  2.  If you choose to wash your hair, wash your hair first as usual with your normal  shampoo.  3.  After you shampoo, rinse your hair and body thoroughly to remove the shampoo.                             4.  Use CHG as you would any other liquid soap.  You can apply chg directly to the skin and wash.  Gently with a scrungie or clean washcloth.  5.  Apply the CHG Soap to your body ONLY FROM THE NECK DOWN.   Do   not use on face/ open                           Wound or open sores. Avoid contact with eyes, ears mouth and   genitals (private parts).                        Wash face,  Genitals (private parts) with your normal soap.             6.  Wash thoroughly, paying special attention to the area where your    surgery  will be performed.  7.  Thoroughly rinse your body with warm water  from the neck down.  8.  DO NOT shower/wash with your normal soap after using and rinsing off the CHG Soap.                9.  Pat yourself dry with  a clean towel.            10.  Wear clean pajamas.            11.  Place clean sheets on your bed the night of your first shower and do not  sleep with pets. Day of Surgery : Do not apply any CHG, lotions/deodorants the morning of surgery.  Please wear clean clothes to the hospital/surgery center.  FAILURE TO FOLLOW THESE INSTRUCTIONS MAY RESULT IN THE CANCELLATION OF YOUR SURGERY  PATIENT SIGNATURE_________________________________  NURSE SIGNATURE__________________________________  ________________________________________________________________________  Kristen [name]  An incentive spirometer is a tool that can help keep your lungs clear and active. This tool measures how well you are filling your lungs with each breath. Taking long deep breaths may help reverse or decrease the chance of developing breathing (pulmonary) problems (especially infection) following: A long period of time when you are unable to move or be active. BEFORE THE PROCEDURE  If the spirometer includes an indicator to show your best effort, your nurse or respiratory therapist will set it to a desired goal. If possible, sit up straight or lean slightly forward. Try not to slouch. Hold the incentive spirometer in an upright position. INSTRUCTIONS FOR USE  Sit on the edge of your bed if possible, or sit up as far as you can in bed or on a chair. Hold the incentive spirometer in an upright position. Breathe out normally. Place the mouthpiece in your mouth and seal your lips tightly around it. Breathe in slowly and as deeply as  possible, raising the piston or the ball toward the top of the column. Hold your breath for 3-5 seconds or for as long as possible. Allow the piston or ball to fall to the bottom of the column. Remove the mouthpiece from your mouth and breathe out normally. Rest for a few seconds and repeat Steps 1 through 7 at least 10 times every 1-2 hours when you are awake. Take your time and take a few normal breaths between deep breaths. The spirometer may include an indicator to show your best effort. Use the indicator as a goal to work toward during each repetition. After each set of 10 deep breaths, practice coughing to be sure your lungs are clear. If you have an incision (the cut made at the time of surgery), support your incision when coughing by placing a pillow or rolled up towels firmly against it. Once you are able to get out of bed, walk around indoors and cough well. You may stop using the incentive spirometer when instructed by your caregiver.  RISKS AND COMPLICATIONS Take your time so you do not get dizzy or light-headed. If you are in pain, you may need to take or ask for pain medication before doing incentive spirometry. It is harder to take a deep breath if you are having pain. AFTER USE Rest and breathe slowly and easily. It can be helpful to keep track of a log of your progress. Your caregiver can provide you with a simple table to help with this. If you are using the spirometer at home, follow these instructions: SEEK MEDICAL CARE IF:  You are having difficultly using the spirometer. You have trouble using the spirometer as often as instructed. Your pain medication is not giving enough relief while using the spirometer. You develop fever of 100.5 F (38.1 C) or higher. SEEK IMMEDIATE MEDICAL CARE IF:  You cough up bloody sputum that had not been present before. You develop  fever of 102 F (38.9 C) or greater. You develop worsening pain at or near the incision site. MAKE SURE YOU:   Understand these instructions. Will watch your condition. Will get help right away if you are not doing well or get worse. Document Released: 06/10/2006 Document Revised: 04/22/2011 Document Reviewed: 08/11/2006 Exit[employer] Patient Information 2014 [employer], [place].   ________________________________________________________________________ WHAT IS A BLOOD TRANSFUSION? Blood Transfusion Information  A transfusion is the replacement of blood or some of its parts. Blood is made up of multiple cells which provide different functions. Red blood cells carry oxygen and are used for blood loss replacement. White blood cells fight against infection. Platelets control bleeding. Plasma helps clot blood. Other blood products are available for specialized needs, such as hemophilia or other clotting disorders. BEFORE THE TRANSFUSION  Who gives blood for transfusions?  Healthy volunteers who are fully evaluated to make sure their blood is safe. This is blood bank blood. Transfusion therapy is the safest it has ever been in the practice of medicine. Before blood is taken from a donor, a complete history is taken to make sure that person has no history of diseases nor engages in risky social behavior (examples are intravenous drug use or sexual activity with multiple partners). The donor's travel history is screened to minimize risk of transmitting infections, such as malaria. The donated blood is tested for signs of infectious diseases, such as HIV and hepatitis. The blood is then tested to be sure it is compatible with you in order to minimize the chance of a transfusion reaction. If you or a relative donates blood, this is often done in anticipation of surgery and is not appropriate for emergency situations. It takes many days to process the donated blood. RISKS AND COMPLICATIONS Although transfusion therapy is very safe and saves many lives, the main dangers of transfusion include:  Getting an infectious  disease. Developing a transfusion reaction. This is an allergic reaction to something in the blood you were given. Every precaution is taken to prevent this. The decision to have a blood transfusion has been considered carefully by your caregiver before blood is given. Blood is not given unless the benefits outweigh the risks. AFTER THE TRANSFUSION Right after receiving a blood transfusion, you will usually feel much better and more energetic. This is especially true if your red blood cells have gotten low (anemic). The transfusion raises the level of the red blood cells which carry oxygen, and this usually causes an energy increase. The nurse administering the transfusion will monitor you carefully for complications. HOME CARE INSTRUCTIONS  No special instructions are needed after a transfusion. You may find your energy is better. Speak with your caregiver about any limitations on activity for underlying diseases you may have. SEEK MEDICAL CARE IF:  Your condition is not improving after your transfusion. You develop redness or irritation at the intravenous (IV) site. SEEK IMMEDIATE MEDICAL CARE IF:  Any of the following symptoms occur over the next 12 hours: Shaking chills. You have a temperature by mouth above 102 F (38.9 C), not controlled by medicine. Chest, back, or muscle pain. People around you feel you are not acting correctly or are confused. Shortness of breath or difficulty breathing. Dizziness and fainting. You get a rash or develop hives. You have a decrease in urine output. Your urine turns a dark color or changes to pink, red, or brown. Any of the following symptoms occur over the next 10 days: You have a temperature by mouth above  102 F (38.9 C), not controlled by medicine. Shortness of breath. Weakness after normal activity. The white part of the eye turns yellow (jaundice). You have a decrease in the amount of urine or are urinating less often. Your urine turns a  dark color or changes to pink, red, or brown. Document Released: 01/26/2000 Document Revised: 04/22/2011 Document Reviewed: 09/14/2007 [facility] Patient Information 2014 [place], [place].  _______________________________________________________________________

## 2024-01-15 NOTE — Progress Notes (Addendum)
 Date of COVID positive in last 90 days:  PCP - Rosina Bullock, PA Cardiologist - Wilbert Bihari, MD LOV in 2022 Pulmonary- Verdon Gore, MD LOV 2023  Chest x-ray - 04/18/23 [facility] EKG - 01/16/24 Epic/chart Stress Test - N/A ECHO - 01/10/20 Epic Cardiac Cath - 12/23/19 Epic Pacemaker/ICD device last checked:N/A Spinal Cord Stimulator:N/A  Bowel Prep - clears day before, Miralax , patient aware  Sleep Study - yes CPAP - no, not a problem anymore per pt  Fasting Blood Sugar - preDM, just taking Metformin , no checks at home Checks Blood Sugar _____ times a day  Last dose of GLP1 agonist-  N/A GLP1 instructions:  Do not take after     Last dose of SGLT-2 inhibitors-  N/A SGLT-2 instructions:  Do not take after     Blood Thinner Instructions: N/A Last dose:   Time: Aspirin  Instructions:N/A Last Dose:  Activity level: Can go up a flight of stairs and perform activities of daily living without stopping and without symptoms of chest pain or shortness of breath.  Anesthesia review: N/A  Patient denies shortness of breath, fever, cough and chest pain at PAT appointment  Patient verbalized understanding of instructions that were given to them at the PAT appointment. Patient was also instructed that they will need to review over the PAT instructions again at home before surgery.

## 2024-01-16 ENCOUNTER — Other Ambulatory Visit: Payer: Self-pay

## 2024-01-16 ENCOUNTER — Encounter (HOSPITAL_COMMUNITY)
Admission: RE | Admit: 2024-01-16 | Discharge: 2024-01-16 | Disposition: A | Source: Ambulatory Visit | Attending: Surgery

## 2024-01-16 ENCOUNTER — Encounter (HOSPITAL_COMMUNITY): Payer: Self-pay

## 2024-01-16 VITALS — BP 112/69 | HR 78 | Temp 98.5°F | Resp 16 | Ht 67.0 in | Wt 217.0 lb

## 2024-01-16 DIAGNOSIS — Z01818 Encounter for other preprocedural examination: Secondary | ICD-10-CM

## 2024-01-16 DIAGNOSIS — E119 Type 2 diabetes mellitus without complications: Secondary | ICD-10-CM

## 2024-01-16 LAB — CBC WITH DIFFERENTIAL/PLATELET
Abs Immature Granulocytes: 0.11 K/uL — ABNORMAL HIGH (ref 0.00–0.07)
Basophils Absolute: 0.1 K/uL (ref 0.0–0.1)
Basophils Relative: 1 %
Eosinophils Absolute: 0.4 K/uL (ref 0.0–0.5)
Eosinophils Relative: 4 %
HCT: 38.8 % (ref 36.0–46.0)
Hemoglobin: 12.5 g/dL (ref 12.0–15.0)
Immature Granulocytes: 1 %
Lymphocytes Relative: 15 %
Lymphs Abs: 1.7 K/uL (ref 0.7–4.0)
MCH: 29.4 pg (ref 26.0–34.0)
MCHC: 32.2 g/dL (ref 30.0–36.0)
MCV: 91.3 fL (ref 80.0–100.0)
Monocytes Absolute: 1 K/uL (ref 0.1–1.0)
Monocytes Relative: 9 %
Neutro Abs: 7.7 K/uL (ref 1.7–7.7)
Neutrophils Relative %: 70 %
Platelets: 354 K/uL (ref 150–400)
RBC: 4.25 MIL/uL (ref 3.87–5.11)
RDW: 14.1 % (ref 11.5–15.5)
WBC: 11 K/uL — ABNORMAL HIGH (ref 4.0–10.5)
nRBC: 0 % (ref 0.0–0.2)

## 2024-01-16 LAB — COMPREHENSIVE METABOLIC PANEL WITH GFR
ALT: 24 U/L (ref 0–44)
AST: 31 U/L (ref 15–41)
Albumin: 4.8 g/dL (ref 3.5–5.0)
Alkaline Phosphatase: 107 U/L (ref 38–126)
Anion gap: 13 (ref 5–15)
BUN: 29 mg/dL — ABNORMAL HIGH (ref 6–20)
CO2: 23 mmol/L (ref 22–32)
Calcium: 10.3 mg/dL (ref 8.9–10.3)
Chloride: 105 mmol/L (ref 98–111)
Creatinine, Ser: 1.05 mg/dL — ABNORMAL HIGH (ref 0.44–1.00)
GFR, Estimated: >60 mL/min (ref >60)
Glucose, Bld: 81 mg/dL (ref 70–99)
Potassium: 4.9 mmol/L (ref 3.5–5.1)
Sodium: 141 mmol/L (ref 135–145)
Total Bilirubin: 0.3 mg/dL (ref 0.0–1.2)
Total Protein: 7.3 g/dL (ref 6.5–8.1)

## 2024-01-21 ENCOUNTER — Encounter (HOSPITAL_COMMUNITY): Payer: Self-pay | Admitting: Registered Nurse

## 2024-01-21 ENCOUNTER — Other Ambulatory Visit: Payer: Self-pay

## 2024-01-21 ENCOUNTER — Encounter (HOSPITAL_COMMUNITY): Payer: Self-pay | Admitting: Surgery

## 2024-01-21 ENCOUNTER — Encounter (HOSPITAL_COMMUNITY): Admission: RE | Disposition: A | Payer: Self-pay | Source: Home / Self Care | Attending: Surgery

## 2024-01-21 ENCOUNTER — Inpatient Hospital Stay (HOSPITAL_COMMUNITY): Payer: Self-pay | Admitting: Registered Nurse

## 2024-01-21 ENCOUNTER — Inpatient Hospital Stay (HOSPITAL_COMMUNITY)
Admission: RE | Admit: 2024-01-21 | Discharge: 2024-01-23 | DRG: 331 | Disposition: A | Attending: Surgery | Admitting: Surgery

## 2024-01-21 DIAGNOSIS — E785 Hyperlipidemia, unspecified: Secondary | ICD-10-CM | POA: Diagnosis present

## 2024-01-21 DIAGNOSIS — D259 Leiomyoma of uterus, unspecified: Secondary | ICD-10-CM | POA: Diagnosis present

## 2024-01-21 DIAGNOSIS — Z885 Allergy status to narcotic agent status: Secondary | ICD-10-CM

## 2024-01-21 DIAGNOSIS — Z79899 Other long term (current) drug therapy: Secondary | ICD-10-CM

## 2024-01-21 DIAGNOSIS — K219 Gastro-esophageal reflux disease without esophagitis: Secondary | ICD-10-CM | POA: Diagnosis present

## 2024-01-21 DIAGNOSIS — Z87891 Personal history of nicotine dependence: Secondary | ICD-10-CM

## 2024-01-21 DIAGNOSIS — Z8249 Family history of ischemic heart disease and other diseases of the circulatory system: Secondary | ICD-10-CM

## 2024-01-21 DIAGNOSIS — K581 Irritable bowel syndrome with constipation: Secondary | ICD-10-CM | POA: Diagnosis present

## 2024-01-21 DIAGNOSIS — K5732 Diverticulitis of large intestine without perforation or abscess without bleeding: Principal | ICD-10-CM | POA: Diagnosis present

## 2024-01-21 DIAGNOSIS — Z83438 Family history of other disorder of lipoprotein metabolism and other lipidemia: Secondary | ICD-10-CM

## 2024-01-21 DIAGNOSIS — Z800 Family history of malignant neoplasm of digestive organs: Secondary | ICD-10-CM

## 2024-01-21 DIAGNOSIS — F319 Bipolar disorder, unspecified: Secondary | ICD-10-CM | POA: Diagnosis present

## 2024-01-21 DIAGNOSIS — Z981 Arthrodesis status: Secondary | ICD-10-CM

## 2024-01-21 DIAGNOSIS — Z9049 Acquired absence of other specified parts of digestive tract: Secondary | ICD-10-CM

## 2024-01-21 DIAGNOSIS — Z818 Family history of other mental and behavioral disorders: Secondary | ICD-10-CM

## 2024-01-21 DIAGNOSIS — Z860101 Personal history of adenomatous and serrated colon polyps: Secondary | ICD-10-CM

## 2024-01-21 DIAGNOSIS — Z833 Family history of diabetes mellitus: Secondary | ICD-10-CM | POA: Diagnosis not present

## 2024-01-21 DIAGNOSIS — Z8261 Family history of arthritis: Secondary | ICD-10-CM

## 2024-01-21 DIAGNOSIS — E119 Type 2 diabetes mellitus without complications: Secondary | ICD-10-CM

## 2024-01-21 DIAGNOSIS — Z825 Family history of asthma and other chronic lower respiratory diseases: Secondary | ICD-10-CM | POA: Diagnosis not present

## 2024-01-21 DIAGNOSIS — Z91041 Radiographic dye allergy status: Secondary | ICD-10-CM

## 2024-01-21 DIAGNOSIS — Z87442 Personal history of urinary calculi: Secondary | ICD-10-CM

## 2024-01-21 DIAGNOSIS — E039 Hypothyroidism, unspecified: Secondary | ICD-10-CM | POA: Diagnosis present

## 2024-01-21 DIAGNOSIS — Z888 Allergy status to other drugs, medicaments and biological substances status: Secondary | ICD-10-CM

## 2024-01-21 DIAGNOSIS — Z8759 Personal history of other complications of pregnancy, childbirth and the puerperium: Secondary | ICD-10-CM | POA: Diagnosis not present

## 2024-01-21 DIAGNOSIS — K760 Fatty (change of) liver, not elsewhere classified: Secondary | ICD-10-CM | POA: Diagnosis present

## 2024-01-21 HISTORY — PX: FLEXIBLE SIGMOIDOSCOPY: SHX5431

## 2024-01-21 HISTORY — PX: COLECTOMY, SIGMOID, ROBOT-ASSISTED: SHX7542

## 2024-01-21 HISTORY — PX: XI ROBOTIC ASSISTED LOWER ANTERIOR RESECTION: SHX6558

## 2024-01-21 LAB — GLUCOSE, CAPILLARY
Glucose-Capillary: 95 mg/dL (ref 70–99)
Glucose-Capillary: 149 mg/dL — ABNORMAL HIGH (ref 70–99)
Glucose-Capillary: 136 mg/dL — ABNORMAL HIGH (ref 70–99)
Glucose-Capillary: 99 mg/dL (ref 70–99)

## 2024-01-21 LAB — TYPE AND SCREEN
ABO/RH(D): O POS
Antibody Screen: NEGATIVE

## 2024-01-21 SURGERY — COLECTOMY, SIGMOID, ROBOT-ASSISTED
Anesthesia: General

## 2024-01-21 MED ORDER — KETOROLAC TROMETHAMINE 30 MG/ML IJ SOLN
INTRAMUSCULAR | Status: DC | PRN
  Administered 2024-01-21: 30 mg via INTRAVENOUS

## 2024-01-21 MED ORDER — ENSURE SURGERY PO LIQD
237.0000 mL | Freq: Two times a day (BID) | ORAL | Status: DC
  Administered 2024-01-22 (×2): 237 mL via ORAL

## 2024-01-21 MED ORDER — HEPARIN SODIUM (PORCINE) 5000 UNIT/ML IJ SOLN
5000.0000 [IU] | Freq: Once | INTRAMUSCULAR | Status: AC
  Administered 2024-01-21: 5000 [IU] via SUBCUTANEOUS
  Filled 2024-01-21: qty 1

## 2024-01-21 MED ORDER — INSULIN ASPART 100 UNIT/ML IJ SOLN: 0.0000 [IU] | Freq: Every day | INTRAMUSCULAR | Status: DC

## 2024-01-21 MED ORDER — ACETAMINOPHEN 10 MG/ML IV SOLN
INTRAVENOUS | Status: AC
  Filled 2024-01-21: qty 100

## 2024-01-21 MED ORDER — KETAMINE HCL 50 MG/5ML IJ SOSY
PREFILLED_SYRINGE | INTRAMUSCULAR | Status: DC | PRN
  Administered 2024-01-21: 20 mg via INTRAVENOUS
  Administered 2024-01-21: 10 mg via INTRAVENOUS

## 2024-01-21 MED ORDER — DIPHENHYDRAMINE HCL 50 MG/ML IJ SOLN: 12.5000 mg | Freq: Four times a day (QID) | INTRAMUSCULAR | Status: DC | PRN

## 2024-01-21 MED ORDER — FENTANYL CITRATE (PF) 50 MCG/ML IJ SOSY: 25.0000 ug | PREFILLED_SYRINGE | INTRAMUSCULAR | Status: DC | PRN

## 2024-01-21 MED ORDER — SIMETHICONE 80 MG PO CHEW: 40.0000 mg | CHEWABLE_TABLET | Freq: Four times a day (QID) | ORAL | Status: DC | PRN

## 2024-01-21 MED ORDER — ALVIMOPAN 12 MG PO CAPS: 12.0000 mg | ORAL_CAPSULE | Freq: Two times a day (BID) | ORAL | Status: DC

## 2024-01-21 MED ORDER — INDOCYANINE GREEN 25 MG IJ SOLR
INTRAMUSCULAR | Status: DC | PRN
  Administered 2024-01-21: 2.5 mg via INTRAVENOUS

## 2024-01-21 MED ORDER — HYDRALAZINE HCL 20 MG/ML IJ SOLN: 10.0000 mg | INTRAMUSCULAR | Status: DC | PRN

## 2024-01-21 MED ORDER — KETAMINE HCL 50 MG/5ML IJ SOSY
PREFILLED_SYRINGE | INTRAMUSCULAR | Status: AC
  Filled 2024-01-21: qty 5

## 2024-01-21 MED ORDER — SEMAGLUTIDE 7 MG PO TABS: 7.0000 mg | ORAL_TABLET | Freq: Every day | ORAL | Status: DC

## 2024-01-21 MED ORDER — LIDOCAINE HCL 2 % IJ SOLN
INTRAMUSCULAR | Status: AC
  Filled 2024-01-21: qty 20

## 2024-01-21 MED ORDER — ROCURONIUM BROMIDE 10 MG/ML (PF) SYRINGE
PREFILLED_SYRINGE | INTRAVENOUS | Status: AC
  Filled 2024-01-21: qty 10

## 2024-01-21 MED ORDER — ALPRAZOLAM 0.5 MG PO TABS
1.0000 mg | ORAL_TABLET | Freq: Four times a day (QID) | ORAL | Status: DC | PRN
  Filled 2024-01-21: qty 2

## 2024-01-21 MED ORDER — TENAPANOR HCL 50 MG PO TABS: 50.0000 mg | ORAL_TABLET | Freq: Two times a day (BID) | ORAL | Status: DC

## 2024-01-21 MED ORDER — LORATADINE 10 MG PO TABS
10.0000 mg | ORAL_TABLET | Freq: Every day | ORAL | Status: DC
  Administered 2024-01-22 – 2024-01-23 (×2): 10 mg via ORAL
  Filled 2024-01-21 (×2): qty 1

## 2024-01-21 MED ORDER — AMISULPRIDE (ANTIEMETIC) 5 MG/2ML IV SOLN: 10.0000 mg | Freq: Once | INTRAVENOUS | Status: DC | PRN

## 2024-01-21 MED ORDER — HEPARIN SODIUM (PORCINE) 5000 UNIT/ML IJ SOLN
5000.0000 [IU] | Freq: Three times a day (TID) | INTRAMUSCULAR | Status: DC
  Administered 2024-01-21 – 2024-01-23 (×6): 5000 [IU] via SUBCUTANEOUS
  Filled 2024-01-21 (×6): qty 1

## 2024-01-21 MED ORDER — ROSUVASTATIN CALCIUM 5 MG PO TABS
5.0000 mg | ORAL_TABLET | Freq: Every day | ORAL | Status: DC
  Administered 2024-01-21 – 2024-01-22 (×2): 5 mg via ORAL
  Filled 2024-01-21 (×2): qty 1

## 2024-01-21 MED ORDER — CYCLOBENZAPRINE HCL 10 MG PO TABS
10.0000 mg | ORAL_TABLET | Freq: Every evening | ORAL | Status: DC | PRN
  Administered 2024-01-21: 10 mg via ORAL
  Filled 2024-01-21: qty 1

## 2024-01-21 MED ORDER — MIDAZOLAM HCL 2 MG/2ML IJ SOLN
INTRAMUSCULAR | Status: AC
  Filled 2024-01-21: qty 2

## 2024-01-21 MED ORDER — FENOFIBRATE 160 MG PO TABS
160.0000 mg | ORAL_TABLET | Freq: Every day | ORAL | Status: DC
  Administered 2024-01-22 – 2024-01-23 (×2): 160 mg via ORAL
  Filled 2024-01-21 (×2): qty 1

## 2024-01-21 MED ORDER — ACETAMINOPHEN 500 MG PO TABS
1000.0000 mg | ORAL_TABLET | Freq: Four times a day (QID) | ORAL | Status: DC
  Administered 2024-01-21 – 2024-01-23 (×6): 1000 mg via ORAL
  Filled 2024-01-21 (×6): qty 2

## 2024-01-21 MED ORDER — ALBUTEROL SULFATE (2.5 MG/3ML) 0.083% IN NEBU: 2.5000 mg | INHALATION_SOLUTION | RESPIRATORY_TRACT | Status: DC | PRN

## 2024-01-21 MED ORDER — LAMOTRIGINE 25 MG PO TABS
450.0000 mg | ORAL_TABLET | Freq: Every day | ORAL | Status: DC
  Administered 2024-01-21 – 2024-01-22 (×2): 450 mg via ORAL
  Filled 2024-01-21 (×2): qty 2

## 2024-01-21 MED ORDER — DIPHENHYDRAMINE HCL 12.5 MG/5ML PO ELIX: 12.5000 mg | ORAL_SOLUTION | Freq: Four times a day (QID) | ORAL | Status: DC | PRN

## 2024-01-21 MED ORDER — LIDOCAINE HCL (PF) 2 % IJ SOLN
INTRAMUSCULAR | Status: AC
  Filled 2024-01-21: qty 5

## 2024-01-21 MED ORDER — LIDOCAINE HCL (PF) 2 % IJ SOLN
INTRAMUSCULAR | Status: DC | PRN
  Administered 2024-01-21: 100 mg via INTRADERMAL
  Administered 2024-01-21: 1.5 mg/kg/h via INTRADERMAL

## 2024-01-21 MED ORDER — IPRATROPIUM BROMIDE 0.03 % NA SOLN
2.0000 | Freq: Two times a day (BID) | NASAL | Status: DC
  Administered 2024-01-21 – 2024-01-23 (×2): 2 via NASAL
  Filled 2024-01-21: qty 30

## 2024-01-21 MED ORDER — ACETAMINOPHEN 500 MG PO TABS
1000.0000 mg | ORAL_TABLET | ORAL | Status: DC
  Filled 2024-01-21: qty 2

## 2024-01-21 MED ORDER — KETOROLAC TROMETHAMINE 30 MG/ML IJ SOLN
INTRAMUSCULAR | Status: AC
  Filled 2024-01-21: qty 1

## 2024-01-21 MED ORDER — ALBUTEROL SULFATE HFA 108 (90 BASE) MCG/ACT IN AERS: 2.0000 | INHALATION_SPRAY | Freq: Every morning | RESPIRATORY_TRACT | Status: DC

## 2024-01-21 MED ORDER — MIDAZOLAM HCL 5 MG/5ML IJ SOLN
INTRAMUSCULAR | Status: DC | PRN
  Administered 2024-01-21: 2 mg via INTRAVENOUS

## 2024-01-21 MED ORDER — CHLORHEXIDINE GLUCONATE 0.12 % MT SOLN
15.0000 mL | Freq: Once | OROMUCOSAL | Status: AC
  Administered 2024-01-21: 15 mL via OROMUCOSAL

## 2024-01-21 MED ORDER — BUPIVACAINE-EPINEPHRINE (PF) 0.25% -1:200000 IJ SOLN
INTRAMUSCULAR | Status: AC
  Filled 2024-01-21: qty 60

## 2024-01-21 MED ORDER — IBUPROFEN 400 MG PO TABS
600.0000 mg | ORAL_TABLET | Freq: Four times a day (QID) | ORAL | Status: DC | PRN
  Administered 2024-01-21 – 2024-01-22 (×3): 600 mg via ORAL
  Filled 2024-01-21 (×3): qty 1

## 2024-01-21 MED ORDER — ROCURONIUM BROMIDE 10 MG/ML (PF) SYRINGE
PREFILLED_SYRINGE | INTRAVENOUS | Status: DC | PRN
  Administered 2024-01-21: 20 mg via INTRAVENOUS
  Administered 2024-01-21: 10 mg via INTRAVENOUS
  Administered 2024-01-21: 40 mg via INTRAVENOUS
  Administered 2024-01-21: 60 mg via INTRAVENOUS
  Administered 2024-01-21: 40 mg via INTRAVENOUS

## 2024-01-21 MED ORDER — TRAMADOL HCL 50 MG PO TABS: 50.0000 mg | ORAL_TABLET | Freq: Four times a day (QID) | ORAL | 0 refills | Status: AC | PRN

## 2024-01-21 MED ORDER — TRAMADOL HCL 50 MG PO TABS
50.0000 mg | ORAL_TABLET | Freq: Four times a day (QID) | ORAL | Status: DC | PRN
  Administered 2024-01-21 – 2024-01-22 (×4): 50 mg via ORAL
  Filled 2024-01-21 (×5): qty 1

## 2024-01-21 MED ORDER — ACETAMINOPHEN 10 MG/ML IV SOLN
INTRAVENOUS | Status: DC | PRN
  Administered 2024-01-21: 1000 mg via INTRAVENOUS

## 2024-01-21 MED ORDER — ONDANSETRON HCL 4 MG/2ML IJ SOLN
INTRAMUSCULAR | Status: DC | PRN
  Administered 2024-01-21: 4 mg via INTRAVENOUS

## 2024-01-21 MED ORDER — BUSPIRONE HCL 5 MG PO TABS
30.0000 mg | ORAL_TABLET | Freq: Two times a day (BID) | ORAL | Status: DC
  Administered 2024-01-21 – 2024-01-23 (×4): 30 mg via ORAL
  Filled 2024-01-21 (×4): qty 6

## 2024-01-21 MED ORDER — PROPOFOL 10 MG/ML IV BOLUS
INTRAVENOUS | Status: AC
  Filled 2024-01-21: qty 20

## 2024-01-21 MED ORDER — DEXAMETHASONE SOD PHOSPHATE PF 10 MG/ML IJ SOLN
INTRAMUSCULAR | Status: DC | PRN
  Administered 2024-01-21: 8 mg via INTRAVENOUS

## 2024-01-21 MED ORDER — PROPOFOL 1000 MG/100ML IV EMUL
INTRAVENOUS | Status: AC
  Filled 2024-01-21: qty 100

## 2024-01-21 MED ORDER — PANTOPRAZOLE SODIUM 40 MG PO TBEC
40.0000 mg | DELAYED_RELEASE_TABLET | Freq: Every day | ORAL | Status: DC
  Administered 2024-01-22 – 2024-01-23 (×2): 40 mg via ORAL
  Filled 2024-01-21 (×2): qty 1

## 2024-01-21 MED ORDER — DEXTROMETHORPHAN-BUPROPION ER 45-105 MG PO TBCR
1.0000 | EXTENDED_RELEASE_TABLET | Freq: Two times a day (BID) | ORAL | Status: DC
  Administered 2024-01-22: 1 via ORAL

## 2024-01-21 MED ORDER — INSULIN ASPART 100 UNIT/ML IJ SOLN
0.0000 [IU] | Freq: Three times a day (TID) | INTRAMUSCULAR | Status: DC
  Administered 2024-01-21: 2 [IU] via SUBCUTANEOUS
  Filled 2024-01-21: qty 2

## 2024-01-21 MED ORDER — LACTATED RINGERS IR SOLN
Status: DC | PRN
  Administered 2024-01-21: 1000 mL

## 2024-01-21 MED ORDER — SODIUM CHLORIDE 0.9 % IV SOLN
2.0000 g | INTRAVENOUS | Status: AC
  Administered 2024-01-21: 2 g via INTRAVENOUS
  Filled 2024-01-21: qty 2

## 2024-01-21 MED ORDER — LACTATED RINGERS IV SOLN: INTRAVENOUS | Status: DC

## 2024-01-21 MED ORDER — HYDROXYZINE HCL 25 MG PO TABS: 50.0000 mg | ORAL_TABLET | Freq: Four times a day (QID) | ORAL | Status: DC | PRN

## 2024-01-21 MED ORDER — SCOPOLAMINE 1 MG/3DAYS TD PT72
1.0000 | MEDICATED_PATCH | TRANSDERMAL | Status: DC
  Administered 2024-01-21: 1 mg via TRANSDERMAL
  Filled 2024-01-21: qty 1

## 2024-01-21 MED ORDER — PROPOFOL 10 MG/ML IV BOLUS
INTRAVENOUS | Status: DC | PRN
  Administered 2024-01-21: 150 mg via INTRAVENOUS
  Administered 2024-01-21: 25 ug/kg/min via INTRAVENOUS

## 2024-01-21 MED ORDER — VALACYCLOVIR HCL 500 MG PO TABS
500.0000 mg | ORAL_TABLET | Freq: Every morning | ORAL | Status: DC
  Administered 2024-01-22 – 2024-01-23 (×2): 500 mg via ORAL
  Filled 2024-01-21 (×2): qty 1

## 2024-01-21 MED ORDER — FENTANYL CITRATE (PF) 100 MCG/2ML IJ SOLN
INTRAMUSCULAR | Status: DC | PRN
  Administered 2024-01-21 (×3): 50 ug via INTRAVENOUS
  Administered 2024-01-21 (×2): 25 ug via INTRAVENOUS

## 2024-01-21 MED ORDER — MELATONIN 5 MG PO TABS
10.0000 mg | ORAL_TABLET | Freq: Every day | ORAL | Status: DC
  Administered 2024-01-21 – 2024-01-22 (×2): 10 mg via ORAL
  Filled 2024-01-21 (×2): qty 2

## 2024-01-21 MED ORDER — LEVOTHYROXINE SODIUM 75 MCG PO TABS
75.0000 ug | ORAL_TABLET | Freq: Every day | ORAL | Status: DC
  Administered 2024-01-22 – 2024-01-23 (×2): 75 ug via ORAL
  Filled 2024-01-21 (×2): qty 1

## 2024-01-21 MED ORDER — CHLORHEXIDINE GLUCONATE CLOTH 2 % EX PADS: 6.0000 | MEDICATED_PAD | Freq: Once | CUTANEOUS | Status: DC

## 2024-01-21 MED ORDER — OXCARBAZEPINE 300 MG PO TABS
600.0000 mg | ORAL_TABLET | Freq: Two times a day (BID) | ORAL | Status: DC
  Administered 2024-01-21 – 2024-01-23 (×5): 600 mg via ORAL
  Filled 2024-01-21 (×5): qty 2

## 2024-01-21 MED ORDER — SUGAMMADEX SODIUM 200 MG/2ML IV SOLN
INTRAVENOUS | Status: DC | PRN
  Administered 2024-01-21: 200 mg via INTRAVENOUS

## 2024-01-21 MED ORDER — METFORMIN HCL ER 750 MG PO TB24
750.0000 mg | ORAL_TABLET | Freq: Every day | ORAL | Status: DC
  Administered 2024-01-22 – 2024-01-23 (×2): 750 mg via ORAL
  Filled 2024-01-21 (×2): qty 1

## 2024-01-21 MED ORDER — ENSURE PRE-SURGERY PO LIQD: 296.0000 mL | Freq: Once | ORAL | Status: DC

## 2024-01-21 MED ORDER — ENSURE PRE-SURGERY PO LIQD: 592.0000 mL | Freq: Once | ORAL | Status: DC

## 2024-01-21 MED ORDER — FENTANYL CITRATE (PF) 100 MCG/2ML IJ SOLN
INTRAMUSCULAR | Status: AC
  Filled 2024-01-21: qty 2

## 2024-01-21 MED ORDER — HYDROMORPHONE HCL 1 MG/ML IJ SOLN
0.5000 mg | INTRAMUSCULAR | Status: DC | PRN
  Administered 2024-01-21 – 2024-01-22 (×3): 0.5 mg via INTRAVENOUS
  Filled 2024-01-21 (×3): qty 0.5

## 2024-01-21 MED ORDER — SUGAMMADEX SODIUM 200 MG/2ML IV SOLN
INTRAVENOUS | Status: AC
  Filled 2024-01-21: qty 2

## 2024-01-21 MED ORDER — ONDANSETRON HCL 4 MG/2ML IJ SOLN
INTRAMUSCULAR | Status: AC
  Filled 2024-01-21: qty 2

## 2024-01-21 MED ORDER — INSULIN ASPART 100 UNIT/ML IJ SOLN: 0.0000 [IU] | INTRAMUSCULAR | Status: DC | PRN

## 2024-01-21 MED ORDER — SODIUM CHLORIDE (PF) 0.9 % IJ SOLN
INTRAMUSCULAR | Status: AC
  Filled 2024-01-21: qty 20

## 2024-01-21 MED ORDER — FAMOTIDINE 20 MG PO TABS
40.0000 mg | ORAL_TABLET | Freq: Every day | ORAL | Status: DC
  Administered 2024-01-21 – 2024-01-22 (×2): 40 mg via ORAL
  Filled 2024-01-21 (×2): qty 2

## 2024-01-21 MED ORDER — ORAL CARE MOUTH RINSE: 15.0000 mL | Freq: Once | OROMUCOSAL | Status: AC

## 2024-01-21 MED ORDER — DICYCLOMINE HCL 20 MG PO TABS
20.0000 mg | ORAL_TABLET | Freq: Two times a day (BID) | ORAL | Status: DC
  Administered 2024-01-21 – 2024-01-23 (×5): 20 mg via ORAL
  Filled 2024-01-21 (×5): qty 1

## 2024-01-21 MED ORDER — ALVIMOPAN 12 MG PO CAPS
12.0000 mg | ORAL_CAPSULE | ORAL | Status: AC
  Administered 2024-01-21: 12 mg via ORAL
  Filled 2024-01-21: qty 1

## 2024-01-21 MED ORDER — ALUM & MAG HYDROXIDE-SIMETH 200-200-20 MG/5ML PO SUSP: 30.0000 mL | Freq: Four times a day (QID) | ORAL | Status: DC | PRN

## 2024-01-21 MED ORDER — 0.9 % SODIUM CHLORIDE (POUR BTL) OPTIME
TOPICAL | Status: DC | PRN
  Administered 2024-01-21: 1000 mL

## 2024-01-21 MED ORDER — ONDANSETRON HCL 4 MG/2ML IJ SOLN
4.0000 mg | Freq: Four times a day (QID) | INTRAMUSCULAR | Status: DC | PRN
  Administered 2024-01-21: 4 mg via INTRAVENOUS
  Filled 2024-01-21: qty 2

## 2024-01-21 MED ORDER — SERTRALINE HCL 100 MG PO TABS
300.0000 mg | ORAL_TABLET | Freq: Every day | ORAL | Status: DC
  Administered 2024-01-21 – 2024-01-22 (×2): 300 mg via ORAL
  Filled 2024-01-21 (×2): qty 3

## 2024-01-21 MED ORDER — BUPIVACAINE-EPINEPHRINE (PF) 0.25% -1:200000 IJ SOLN
INTRAMUSCULAR | Status: DC | PRN
  Administered 2024-01-21: 60 mL via PERINEURAL

## 2024-01-21 MED ORDER — ONDANSETRON HCL 4 MG PO TABS: 4.0000 mg | ORAL_TABLET | Freq: Four times a day (QID) | ORAL | Status: DC | PRN

## 2024-01-21 SURGICAL SUPPLY — 82 items
BAG COUNTER SPONGE SURGICOUNT (BAG) IMPLANT
BLADE EXTENDED COATED 6.5IN (ELECTRODE) ×1 IMPLANT
CANNULA REDUCER 12-8 DVNC XI (CANNULA) ×1 IMPLANT
CHLORAPREP W/TINT 26 (MISCELLANEOUS) ×1 IMPLANT
CLIP APPLIE 5 13 M/L LIGAMAX5 (MISCELLANEOUS) IMPLANT
CLIP APPLIE ROT 10 11.4 M/L (STAPLE) IMPLANT
CLIP LIGATING HEM O LOK PURPLE (MISCELLANEOUS) IMPLANT
CLIP LIGATING HEMO O LOK GREEN (MISCELLANEOUS) IMPLANT
COVER SURGICAL LIGHT HANDLE (MISCELLANEOUS) ×2 IMPLANT
COVER TIP SHEARS 8 DVNC (MISCELLANEOUS) ×1 IMPLANT
DEFOGGER SCOPE WARM SEASHARP (MISCELLANEOUS) ×2 IMPLANT
DERMABOND ADVANCED .7 DNX12 (GAUZE/BANDAGES/DRESSINGS) IMPLANT
DEVICE TROCAR PUNCTURE CLOSURE (ENDOMECHANICALS) IMPLANT
DRAIN CHANNEL 19F RND (DRAIN) ×1 IMPLANT
DRAPE ARM DVNC X/XI (DISPOSABLE) ×4 IMPLANT
DRAPE COLUMN DVNC XI (DISPOSABLE) ×1 IMPLANT
DRAPE CV SPLIT W-CLR ANES SCRN (DRAPES) ×1 IMPLANT
DRAPE PERI GROIN 82X75IN TIB (DRAPES) ×1 IMPLANT
DRAPE SURG IRRIG POUCH 19X23 (DRAPES) ×1 IMPLANT
DRIVER NDL LRG 8 DVNC XI (INSTRUMENTS) ×1 IMPLANT
DRSG OPSITE POSTOP 4X10 (GAUZE/BANDAGES/DRESSINGS) IMPLANT
DRSG OPSITE POSTOP 4X6 (GAUZE/BANDAGES/DRESSINGS) IMPLANT
DRSG OPSITE POSTOP 4X8 (GAUZE/BANDAGES/DRESSINGS) IMPLANT
DRSG TEGADERM 2-3/8X2-3/4 SM (GAUZE/BANDAGES/DRESSINGS) ×5 IMPLANT
DRSG TEGADERM 4X4.75 (GAUZE/BANDAGES/DRESSINGS) ×1 IMPLANT
ELECT REM PT RETURN 15FT ADLT (MISCELLANEOUS) ×1 IMPLANT
EVACUATOR SILICONE 100CC (DRAIN) ×1 IMPLANT
GAUZE SPONGE 2X2 8PLY STRL LF (GAUZE/BANDAGES/DRESSINGS) ×1 IMPLANT
GAUZE SPONGE 4X4 12PLY STRL (GAUZE/BANDAGES/DRESSINGS) IMPLANT
GLOVE BIO SURGEON STRL SZ7.5 (GLOVE) ×3 IMPLANT
GLOVE INDICATOR 8.0 STRL GRN (GLOVE) ×3 IMPLANT
GOWN SRG XL LVL 4 BRTHBL STRL (GOWNS) ×1 IMPLANT
GOWN STRL REUS W/ TWL XL LVL3 (GOWN DISPOSABLE) ×5 IMPLANT
GRASPER SUT TROCAR 14GX15 (MISCELLANEOUS) IMPLANT
GRASPER TIP-UP FEN DVNC XI (INSTRUMENTS) ×1 IMPLANT
HOLDER FOLEY CATH W/STRAP (MISCELLANEOUS) ×1 IMPLANT
IRRIGATION SUCT STRKRFLW 2 WTP (MISCELLANEOUS) ×1 IMPLANT
KIT PROCEDURE DVNC SI (MISCELLANEOUS) IMPLANT
KIT TURNOVER KIT A (KITS) ×1 IMPLANT
NDL INSUFFLATION 14GA 120MM (NEEDLE) ×1 IMPLANT
PACK COLON (CUSTOM PROCEDURE TRAY) ×1 IMPLANT
PAD POSITIONING PINK XL (MISCELLANEOUS) ×1 IMPLANT
PENCIL SMOKE EVACUATOR (MISCELLANEOUS) IMPLANT
PROTECTOR NERVE ULNAR (MISCELLANEOUS) ×2 IMPLANT
RELOAD STAPLE 60 3.5 BLU DVNC (STAPLE) IMPLANT
RELOAD STAPLE 60 4.3 GRN DVNC (STAPLE) IMPLANT
RETRACTOR WND ALEXIS 18 MED (MISCELLANEOUS) IMPLANT
SCISSORS LAP 5X35 DISP (ENDOMECHANICALS) IMPLANT
SCISSORS MNPLR CVD DVNC XI (INSTRUMENTS) ×1 IMPLANT
SEAL UNIV 5-12 XI (MISCELLANEOUS) ×4 IMPLANT
SEALER VESSEL EXT DVNC XI (MISCELLANEOUS) ×1 IMPLANT
SLEEVE ADV FIXATION 5X100MM (TROCAR) IMPLANT
SOLUTION ELECTROSURG ANTI STCK (MISCELLANEOUS) ×1 IMPLANT
SPIKE FLUID TRANSFER (MISCELLANEOUS) ×1 IMPLANT
STAPLER 60 SUREFORM DVNC (STAPLE) IMPLANT
STAPLER ECHELON POWER CIR 29 (STAPLE) IMPLANT
STAPLER ECHELON POWER CIR 31 (STAPLE) IMPLANT
STOPCOCK 4 WAY LG BORE MALE ST (IV SETS) ×2 IMPLANT
SURGILUBE 2OZ TUBE FLIPTOP (MISCELLANEOUS) ×1 IMPLANT
SUT MNCRL AB 4-0 PS2 18 (SUTURE) ×1 IMPLANT
SUT PDS AB 1 CT1 27 (SUTURE) IMPLANT
SUT PDS AB 1 TP1 96 (SUTURE) IMPLANT
SUT PROLENE 0 CT 2 (SUTURE) IMPLANT
SUT PROLENE 2 0 KS (SUTURE) ×1 IMPLANT
SUT PROLENE 2 0 SH DA (SUTURE) IMPLANT
SUT SILK 2 0 SH CR/8 (SUTURE) IMPLANT
SUT SILK 2-0 18XBRD TIE 12 (SUTURE) IMPLANT
SUT SILK 3 0 SH CR/8 (SUTURE) ×1 IMPLANT
SUT SILK 3-0 18XBRD TIE 12 (SUTURE) ×1 IMPLANT
SUT VIC AB 3-0 SH 18 (SUTURE) IMPLANT
SUT VIC AB 3-0 SH 27XBRD (SUTURE) IMPLANT
SUT VICRYL 0 UR6 27IN ABS (SUTURE) ×1 IMPLANT
SUTURE V-LC BRB 180 2/0GR6GS22 (SUTURE) IMPLANT
SYR 10ML LL (SYRINGE) ×1 IMPLANT
SYSTEM LAPSCP GELPORT 120MM (MISCELLANEOUS) IMPLANT
SYSTEM WOUND ALEXIS 18CM MED (MISCELLANEOUS) ×1 IMPLANT
TAPE UMBILICAL 1/8 X36 TWILL (MISCELLANEOUS) ×1 IMPLANT
TRAY FOLEY MTR SLVR 14FR STAT (SET/KITS/TRAYS/PACK) IMPLANT
TRAY FOLEY MTR SLVR 16FR STAT (SET/KITS/TRAYS/PACK) ×1 IMPLANT
TROCAR ADV FIXATION 5X100MM (TROCAR) ×1 IMPLANT
TUBING CONNECTING 10 (TUBING) ×3 IMPLANT
TUBING INSUFFLATION 10FT LAP (TUBING) ×1 IMPLANT

## 2024-01-21 NOTE — Transfer of Care (Signed)
 Immediate Anesthesia Transfer of Care Note  Patient: Camelia VEAR Misty  Procedure(s) Performed: COLECTOMY, SIGMOID, ROBOT-ASSISTED RESECTION, RECTUM, LOW ANTERIOR, ROBOT-ASSISTED SIGMOIDOSCOPY, FLEXIBLE  Patient Location: PACU  Anesthesia Type:General  Level of Consciousness: awake, alert , oriented, and patient cooperative  Airway & Oxygen Therapy: Patient Spontanous Breathing and Patient connected to face mask oxygen  Post-op Assessment: Report given to RN, Post -op Vital signs reviewed and stable, and Patient moving all extremities  Post vital signs: Reviewed and stable  Last Vitals:  Vitals Value Taken Time  BP 123/73 01/21/24 11:10  Temp    Pulse 88 01/21/24 11:12  Resp 15 01/21/24 11:12  SpO2 100 % 01/21/24 11:12  Vitals shown include unfiled device data.  Last Pain:  Vitals:   01/21/24 0702  TempSrc:   PainSc: 0-No pain         Complications: No notable events documented.

## 2024-01-21 NOTE — H&P (Signed)
 CC: Here today for surgery  HPI: Kristen ENDE is an 53 y.o. female with history of kidney stones, hypothyroidism, GERD, HLD, IBS, MDD, bipolar type 1 whom is seen in the office today as a referral by Ellouise Console PA for evaluation of recurrent diverticulitis.   She has had numerous abdominal pelvic CT scans over the years many of which were done for what and have been kidney stones.  CT abdomen/pelvis 08/07/2020: - Sigmoid diverticulitis without abscess - Fatty liver -Right upper pole kidney stone - Fibroid uterus  CT abdomen/pelvis 10/2020: - Diverticulosis without evidence of diverticulitis  Normal HIDA scan 08/2021  CT abdomen/pelvis 12/2021: - Mild sigmoid diverticulitis - 2 mm right renal calculi  CT abdomen/pelvis 08/2023: - Duodenal and total colonic diverticulosis. Mesenteric stranding along the mid to distal sigmoid colon. Worrisome for acute uncomplicated diverticulitis; could also be epiploic appendagitis. No diverticular abscess or pneumoperitoneum. - Bilateral nonobstructive kidney stones. No hydronephrosis.  CT abdomen/pelvis 10/2023: - Acute diverticulitis involving the rectosigmoid colon with pericolonic fat stranding. No fluid or collection.  Reports that her diverticulitis type attacks are very distinct from her kidney stone attacks. She reports she has had at least 9 episodes of diverticulitis over the last 3 years and the frequency does seem to be increasing. Her last attack was 2 weeks ago. When she has diverticular type attacks she experiences lower central abdominal pain, occasionally just to the right and occasionally to the left. This correlates with where her inflammation has been noted on CT.  Currently, denies any abdominal pain. No fever or chills. No nausea or vomiting. The frequency of attacks has been a significant hindrance to quality of life for her. She has noted she is highly motivated to undergo a procedure to hopefully help reduce the chance  of this happening again if at all possible.  Last office visit 2025 Kristen [name] is a 53 year old female with diverticulitis and chronic constipation who presents for evaluation of her gastrointestinal symptoms. She was referred by Dr. Stevie for evaluation of diverticular related issues and constipation.  She has a long-standing history of constipation, present for most of her life. Prior to medication, bowel movements occurred approximately once a week, often requiring additional measures. She was previously on Linzess , which initially worked but eventually lost efficacy. Currently, she is taking a medication referred to as 'DARYLE' which seems to work quite well. To manage symptoms, she now takes Miralax  in addition to her current medication. With medication, bowel movements occur predictably, but without it, she experiences bloating and rumbling in her stomach. Medication results in frequent bathroom trips, interfering with her part-time job and sleep schedule.  She has had a few attacks of diverticulitis over the past two to three years and has resolved with antibiotics - she also reports some of these non-CT attacks may have also been constipation. Her mother suffered from frequent diverticulitis and eventually passed away from complications related to it 4 years ago and this has caused her some anxiety as well. She has been treated with antibiotics for her episodes of diverticulitis. Attacks do not seem now to be clearly associated with or preceded by constipation.  Last colonoscopy - 11/2020 - Dr. Shaaron  - Diverticulosis in the entire examined colon - Otherwise normal on direct and retroflexion views - No specimens  She works a part-time job, which is affected by her medication schedule for constipation. She drinks a significant amount of water  daily, consuming a large Stanley cup of water   three to four times a day.  PMH: kidney stones, hypothyroidism, GERD, HLD, IBS, MDD, bipolar  type 1  PSH: C-section x 2; laparoscopy for twisted fallopian tube/miscarriage; otherwise, denies any prior abdominal or pelvic surgical history.  FHx: Denies any known family history of colorectal, breast, endometrial or ovarian cancer  Social Hx: Denies use of tobacco/EtOH/illicit drug. Currently reports on disability for mental health related issues.   She denies any changes in health or health history since we met in the office. No new medications/allergies. She states she is ready for surgery today. She reports tolerating the bowel prep with satisfactory result.  Past Medical History:  Diagnosis Date   Allergies    Anemia    Anxiety    Arthritis    Atypical mole 11/03/2006   mid upper back (slight to moderate)   Atypical mole 11/03/2006   lower right back (slight to moderate)   Back pain    Bipolar 1 disorder (HCC)    Borderline diabetic    Chronic constipation    Depression    Diabetes (HCC)    pre DM   Diverticulitis    Family history of adverse reaction to anesthesia    mother-- ponv   Fatty liver    GAD (generalized anxiety disorder)    GERD (gastroesophageal reflux disease)    Hiatal hernia    High triglycerides    History of kidney stones    History of recurrent UTIs    History of sepsis 06/2018   History of stomach ulcers    History of suicidal ideation    Hyperlipidemia    Hypothyroidism    IDA (iron  deficiency anemia)    Joint pain    Melanoma (HCC) 11/03/2006   left chest in situ (excision)   Orthostasis    Palpitations    PONV (postoperative nausea and vomiting)    Pre-diabetes    Rapid heart rate    Renal calculus, left    Seasonal allergies    Shortness of breath    Sleep disorder, unspecified    per excessive sleeping during the day   Snoring    Suicide ideation    Swallowing difficulty    Tachycardia    Vertigo    Vitamin D  deficiency    Weakness    Wears contact lenses     Past Surgical History:  Procedure Laterality Date    ANTERIOR CERVICAL DECOMP/DISCECTOMY FUSION  02/17/2012   Procedure: ANTERIOR CERVICAL DECOMPRESSION/DISCECTOMY FUSION 2 LEVELS;  Surgeon: Lamar LELON Peaches, MD;  Location: [facility];  Service: Neurosurgery;  Laterality: N/A;  Cervical four-five and Cervical six-seven anterior cervial decompression with fusion plating and bonegraft   ANTERIOR CERVICAL DECOMP/DISCECTOMY FUSION  04-01-2001   @[facility]    C5---6   BIOPSY  07/20/2018   Procedure: BIOPSY;  Surgeon: Shaaron Lamar HERO, MD;  Location: [facility];  Service: Endoscopy;;  gastric    BREAST REDUCTION SURGERY Bilateral 1995   CESAREAN SECTION  x2   last one 1998   with BILATERAL TUBAL LIGATION   CESAREAN SECTION WITH BILATERAL TUBAL LIGATION     COLONOSCOPY WITH PROPOFOL  N/A 07/20/2018   Dr. Shaaron: Diverticulosis, internal hemorrhoids, 5 mm splenic flexure tubular adenoma removed. Repeat in 5 years due to family history.   COLONOSCOPY WITH PROPOFOL  N/A 12/07/2020   Procedure: COLONOSCOPY WITH PROPOFOL ;  Surgeon: Shaaron Lamar HERO, MD;  Location: [facility];  Service: Endoscopy;  Laterality: N/A;  2:30pm   CYSTOSCOPY/URETEROSCOPY/HOLMIUM LASER/STENT PLACEMENT Left 08/21/2018  Procedure: CYSTOSCOPY LEFT RETROGRADE PYELOGRAM /URETEROSCOPY/HOLMIUM LASER/STENT PLACEMENT;  Surgeon: Devere Lonni Righter, MD;  Location: [facility];  Service: Urology;  Laterality: Left;   DILATION AND CURETTAGE OF UTERUS  1992   for miscarriage   ENDOMETRIAL ABLATION  2014   ESOPHAGOGASTRODUODENOSCOPY     20 years ago.    ESOPHAGOGASTRODUODENOSCOPY (EGD) WITH PROPOFOL  N/A 07/20/2018   Dr. Shaaron: Small hiatal hernia, erythematous mucosa in the stomach with a healing gastric ulcer measuring 1 cm, biopsies negative.   ESOPHAGOGASTRODUODENOSCOPY (EGD) WITH PROPOFOL  N/A 10/05/2020   Procedure: ESOPHAGOGASTRODUODENOSCOPY (EGD) WITH PROPOFOL ;  Surgeon: Shaaron Lamar HERO, MD;  Location: [facility];  Service: Endoscopy;  Laterality: N/A;    EXTRACORPOREAL SHOCK WAVE LITHOTRIPSY Left 11/07/2023   Procedure: LITHOTRIPSY, ESWL;  Surgeon: Devere Lonni Righter, MD;  Location: [facility];  Service: Urology;  Laterality: Left;  LEFT EXTRACORPOREAL SHOCKWAVE LITHOTRIPSY   MALONEY DILATION N/A 10/05/2020   Procedure: MALONEY DILATION;  Surgeon: Shaaron Lamar HERO, MD;  Location: [facility];  Service: Endoscopy;  Laterality: N/A;   MICROTUBOPLASTY  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   POLYPECTOMY  07/20/2018   Procedure: POLYPECTOMY;  Surgeon: Shaaron Lamar HERO, MD;  Location: [facility];  Service: Endoscopy;;   POSTERIOR CERVICAL FUSION/FORAMINOTOMY N/A 08/18/2013   Procedure: CERVICAL FOUR TO CERVICAL SEVEN POSTERIOR CERVICAL FUSION/FORAMINOTOMY LEVEL 3;  Surgeon: Lamar LELON Peaches, MD;  Location: [facility];  Service: Neurosurgery;  Laterality: N/A;  C4-7 posterior cervical fusion with lateral mass fixation   RIGHT/LEFT HEART CATH AND CORONARY ANGIOGRAPHY N/A 12/23/2019   Procedure: RIGHT/LEFT HEART CATH AND CORONARY ANGIOGRAPHY;  Surgeon: Verlin Lonni BIRCH, MD;  Location: [facility];  Service: Cardiovascular;  Laterality: N/A;    Family History  Problem Relation Age of Onset   Asthma Mother    Rheum arthritis Mother    Non-Hodgkin's lymphoma Mother    Congestive Heart Failure Mother    Atrial fibrillation Mother    Diabetes Mother    Hyperlipidemia Mother    Cancer Mother    Colon cancer Father    Multiple myeloma Father    Hypertension Father    Cancer Father    Depression Father    Anxiety disorder Father    Alcoholism Father    Obesity Father    Diabetes Maternal Grandmother    Congestive Heart Failure Maternal Grandmother    Mental illness Paternal Grandmother    Colon cancer Paternal Grandfather    Bone cancer Paternal Grandfather    Allergies Other        entire family(mom and dad)   Liver disease Neg Hx     Social:  reports that she quit smoking about 21  years ago. Her smoking use included cigarettes. She started smoking about 26 years ago. She has a 5 pack-year smoking history. She has been exposed to tobacco smoke. She has never used smokeless tobacco. She reports that she does not drink alcohol and does not use drugs.  Allergies:  Allergies  Allergen Reactions   Iohexol  Hives     Desc: IVP DYE Also developed hives when drinking this contrast responded to benadryl .  Desc: IVP DYE  Desc: IVP DYE Also developed hives when drinking this contrast responded to benadryl . Was pre medicated with prednisone  and benadryl  and did okay   Rosuvastatin  Other (See Comments)    Muscle aches with 10mg  and 20mg , tolerates 5mg  daily without issues Muscle aches  with 20mg   Muscle aches with 10mg  and 20mg , tolerates 5mg  daily without issues    Diatrizoate Other (See Comments)   Oxycodone -Acetaminophen  Itching    Medications: I have reviewed the patient's current medications.  Results for orders placed or performed during the hospital encounter of 01/21/24 (from the past 48 hours)  Glucose, capillary     Status: None   Collection Time: 01/21/24  6:51 AM  Result Value Ref Range   Glucose-Capillary 95 70 - 99 mg/dL    Comment: Glucose reference range applies only to samples taken after fasting for at least 8 hours.    No results found.   PE Blood pressure 118/79, pulse 70, temperature (!) 97.5 F (36.4 C), temperature source Oral, resp. rate 16, height 5' 7 (1.702 m), weight 98.4 kg, SpO2 96%. Constitutional: NAD; conversant Eyes: Moist conjunctiva; no lid lag; anicteric Lungs: Normal respiratory effort CV: RRR GI: Abd soft, NT/ND Psychiatric: Appropriate affect  Results for orders placed or performed during the hospital encounter of 01/21/24 (from the past 48 hours)  Glucose, capillary     Status: None   Collection Time: 01/21/24  6:51 AM  Result Value Ref Range   Glucose-Capillary 95 70 - 99 mg/dL    Comment: Glucose reference range  applies only to samples taken after fasting for at least 8 hours.    No results found.  A/P: Kristen [name] is an 53 y.o. female with hx of kidney stones, hypothyroidism, GERD, HLD, IBS, MDD, bipolar type 1 here for evaluation of recurrent sigmoid diverticulitis  -She notes plans are underway for consultation with GI-Dr. Stoney endoscopic evaluation if CT this Friday is ok  -Previously, we had seen her for her chronic constipation but this does appear to be highly responsive to medication and she is not indicated any interest to pursue with any sort of surgery for that purpose. She is however interested in pursuing surgery for the purposes of her recurrent diverticulitis which thus far have been uncomplicated but numerous in number. There were peering her quality of life. She can definitively distinguish symptoms from kidney stones from her diverticular flares.  -The anatomy and physiology of the GI tract was reviewed with her. The pathophysiology of diverticulitis was discussed as well with associated pictures. We discussed that with diverticulitis, surgery in this particular case is largely for quality of life sited indications. We discussed that there is no hard and fast recommendation for or against surgery but this is a decision largely be made by her after hearing about the procedure, risks, benefits, and weighing this with the potential for further observation. - In addition to observation, we have also discussed surgery with her as an option. We discussed robotic assisted low anterior resection versus sigmoidectomy; flexible sigmoidoscopy. -The planned procedure, material risks (including, but not limited to, pain, bleeding, infection, scarring, need for blood transfusion, damage to surrounding structures- blood vessels/nerves/viscus/organs, damage to ureter, urine leak, leak from anastomosis, need for additional procedures, increased fecal urgency and/or frequency, scenarios where  a stoma may be necessary and where it may be permanent, worsening of pre-existing medical conditions, chronic diarrhea, constipation secondary to narcotic use, hernia, recurrence, blood clot, pulmonary embolus, pneumonia, heart attack, stroke, death) benefits and alternatives to surgery were discussed at length. The patient's questions were answered to her satisfaction, she voiced understanding and elected to proceed with surgery. Additionally, we discussed typical postoperative expectations and the recovery process. We also cautious to make any promises that this would help with her  underlying constipation issues that she has experienced or her IBS symptoms. We discussed that those symptoms can certainly persist. She has expressed clear standing of this as well.   Lonni Pizza, MD [facility], [facility]

## 2024-01-21 NOTE — Op Note (Signed)
 PATIENT: Kristen [name]  53 y.o. female  Patient Care Team: Nena Rosina LITTIE DEVONNA as PCP - General (Physician Assistant) Wonda Cy BROCKS, RD as Dietitian (Family Medicine) Shaaron Lamar HERO, MD as Consulting Physician (Gastroenterology)  PREOP DIAGNOSIS: RECURRENT SIGMOID DIVERTICULITIS  POSTOP DIAGNOSIS: RECURRENT SIGMOID DIVERTICULITIS  PROCEDURE:  Robotic-assisted anterior resection with double stapled coloproctostomy Intraoperative assessment of perfusion using ICG fluorescence imaging Flexible sigmoidoscopy Bilateral transversus abdominus plane (TAP) blocks  SURGEON: Lonni HERO. Teresa, MD  ASSISTANT: Elspeth Schultze MD  An experienced assistant was required given the complexity of this procedure and the standard of surgical care. My assistant helped with exposure through counter tension, suctioning, ligation and retraction to better visualize the surgical field. My assistant expedited sewing during the case by following my sutures. Wherever I use the term we in the report, my assistant actively helped me with that portion of the procedure.   ANESTHESIA: General endotracheal  EBL: 10 mL Total I/O In: 1546.2 [I.V.:1346.2; IV Piggyback:200] Out: 260 [Urine:250; Blood:10]  DRAINS: none  SPECIMEN: Rectosigmoid colon - open end proximal  COUNTS: Sponge, needle and instrument counts were reported correct x2  FINDINGS: Erythematous/injected and thickened sigmoid colon which is highly redundant.  Diverticulosis.  A well perfused, tension free, hemostatic, air tight 31 mm EEA colorectal anastomosis fashioned 15 cm from the anal verge by flexible sigmoidoscopy.   NARRATIVE: The patient was seen in the pre-op holding area. The risks, benefits, complications, treatment options, and expected outcomes were previously discussed with the patient. The patient agreed with the proposed plan and has signed the informed consent form. The patient was brought to the operating room by the  surgical team, identified as Kristen HILARIO [name], and the procedure verified. She was placed supine on the operating table and SCD's were applied. General endotracheal anesthesia was induced without difficulty. She was then positioned in the lithotomy position with Allen stirrups.  Pressure points were evaluated and padded.  A foley catheter was then placed by nursing under sterile conditions. Hair on the abdomen was clipped.  She was secured to the operating table. The abdomen was then prepped and draped in the standard sterile fashion. A time out was completed and the above information confirmed and need for preoperative antibiotics.   An OG tube was placed by anesthesia and confirmed to be to suction.  At Palmer's point, a stab incision was created and the Veress needle was introduced into the peritoneal cavity on the first attempt.  Intraperitoneal location was confirmed by the aspiration and saline drop test.  Pneumoperitoneum was established to a maximum pressure of 15 mmHg using CO2.  Following this, the abdomen was marked for planned trocar sites.  Just to the right and cephalad to the umbilicus, an 8 mm incision was created and an 8 mm blunt tipped robotic trocar was cautiously placed into the peritoneal cavity.  The laparoscope was inserted and demonstrated no evidence of trocar site nor Veress needle site complications.  The Veress needle was removed.  Bilateral transversus abdominis plane blocks were then created using a dilute mixture of Exparel  with Marcaine .  3 additional 8 mm robotic trochars were placed under direct visualization roughly in a line extending from the right ASIS towards the left upper quadrant. The bladder was inspected and noted to be at/below the pubic symphysis.  Staying 3 fingerbreadths above the pubic symphysis, an incision was created and the 12 mm robotic trocar inserted directed cephalad into the peritoneal cavity under direct visualization.  An additional  5 mm assist port  was placed in the right lateral abdomen under direct visualization.  The abdomen was surveyed and there was omental containing lesions across the low midline that were carefully taken down using the vessel sealer.  Hemostasis is verified.  She was positioned in Trendelenburg with the left side tilted slightly up.  Small bowel was carefully retracted out of the pelvis.  The robot was then docked and I went to the console.   The sigmoid colon was readily identified.  Sigmoid colon is highly redundant, somewhat erythematous, and additionally thickened.  All these findings are consistent with her history of recurrent diverticulitis.  Descending colon is normal in appearance without any thickening.  The rectum is also normal in appearance.  Attachments of the sigmoid colon were taken down from the intersigmoid fossa.  The rectosigmoid colon was grasped and elevated anteriorly.  Beginning with a medial to lateral approach, the peritoneum overlying the presacral space was carefully incised.  The TME plane was readily gained working in a plane between the fascia propria of the rectum and the presacral fascia.  Hypogastric nerves were seen going along the the presacral fascia and were protected free of injury.  Working more proximally, the mesorectum and sigmoid mesentery were carefully mobilized off of the peritoneum.  The left ureter was identified and protected free of injury.  The left gonadal vessels were identified and protected.  These were both swept down.  The superior hemorrhoidal and IMA pedicles were identified. Further mesocolon was mobilized proximally staying in this plane between the retroperitoneum proper and the mesocolon. Attention was then turned to the lateral portion of dissection.  The sigmoid colon was then retracted to the right.  The sigmoid colon was fully mobilized. The descending colon was mobilized by incising the Jayleen Scaglione line of Toldt.  This was done all the way up to the level of the splenic  flexure.  The associated mesocolon was also mobilized medially.  The left ureter again was confirmed to be well away from the vasculature which had been dissected medially.  The rectosigmoid colon was elevated anteriorly. The left ureter was re-identified. The IMA was clear of this. The IMA was then divided with the vessel sealer. The stump was inspected and noted to be completely hemostatic with a good seal.  The mesentery was divided out to the point of planned proximal division.  Working more distally, the rectum was identified where the tinea had splayed and there were loss of appendices epiploica.  This also corresponded to a location overlying the sacral promontory.  Anatomically, this clearly represents the proximal rectum.  The mesentery out to this level was then cleared using the vessel sealer. The distal point of transection on the proximal rectum was identified.   A 60 mm green load robotic stapler was then placed through the 12 mm port and introduced into the peritoneal cavity.  The rectum was divided with essentially a single firing of the stapler.  There was a small corner that was controlled with a second load.  The stump was intact and healthy in appearance.   Attention was turned to performing a perfusion test. ICG was administered by anesthesia and at the level of the cleared mesentery proximally, there was excellent uptake of the tracer.  The rectum was also well perfused in appearance.  There was a visible pulse in the mesentery out to the level of the cleared colon at the level of the proximal sigmoid/descending colon junction.  This colon is  also supple and healthy in appearance without any thickening.  This reached into the pelvis without any difficulty and remained in that location without any tension. A locking grasper was then placed on the sigmoid staple line.   Attention was turned to the extracorporeal portion of the procedure.  The robot was undocked.  I scrubbed back in.   Using the 12 mm trocar site, a Pfannenstiel incision was created and incorporated the fascial opening through the 12 mm port site.  The rectus fascia was incised and then elevated.  The rectus muscle was mobilized free of the overlying fascia.  The peritoneum was incised in the midline well above the location of the bladder.  An Alexis wound protector was placed.  Towels were placed around the field.  The divided colon was passed through the wound protector.  The point of proximal division was identified and was again on a healthy segment of supple colon with a palpable pulse in the mesentery. This was pink in color.  A pursestring device was applied.  A 2-0 Prolene on a Keith needle was passed.  The colon was divided and passed off with the open end being proximal.  EEA sizers were then introduced and a 31 mm EEA selected.  Belt loops consisting of 3-0 silk were placed around the pursestring suture line.  The anvil was placed and the pursestring tied.  A small amount of fat was cleared from the planned anastomosis and no diverticula were apparent within this.  This was placed back into the abdomen and a cap placed over the wound protector port site.  Pneumoperitoneum was reestablished.  I then went below to pass the stapler.  My partner remained above.  EEA sizers were cautiously introduced via the anus and advanced under direct visualization.  The stapler was passed and the spike deployed just anterior to the staple line.  The components were then mated.  Orientation was confirmed such that there is no twisting of the colon nor small bowel underneath the mesenteric defect. Care was taken to ensure no other structures were incorporated within this either.  The stapler was then closed, held, and fired. This was then removed. The donuts were inspected and noted to be complete.  The colon proximal to the anastomosis was then gently occluded. The pelvis was filled with sterile irrigation. Under direct visualization,  I passed a flexible sigmoidoscope.  The anastomosis was under water .  With good distention of the anastomosis there was no air leak. The anastomosis pink in appearance.  This is located at 15 cm from the anal verge by flexible sigmoidoscopy.  It is hemostatic.  Additionally, looking from above, there is no tension on the colon or mesentery.  Sigmoidoscope was withdrawn.  Irrigation was evacuated from the pelvis.  The abdomen and pelvis are surveyed and noted to be completely hemostatic without any apparent injury.  Under direct visualization, all trochars are removed.  The Alexis wound protector was removed.  Gowns/gloves are changed and a fresh set of clean instruments utilized. Additional sterile drapes were placed around the field.   The Pfannenstiel peritoneum was closed with a running 2-0 Vicryl suture.  The rectus fascia was then closed using 2 running #1 PDS sutures.  The fascia was then palpated and noted to be completely closed.  Additional anesthetic was infiltrated at the Pfannenstiel site.  Sponge, needle, and instrument counts were reported correct x2. 4-0 Monocryl subcuticular suture was used to close the skin of all incision sites.  Dermabond  was placed over all incisions.  A honeycomb dressing placed over the Pfannenstiel as well.   She was then taken out of lithotomy, awakened from anesthesia, extubated, and transferred to a stretcher for transport to PACU in satisfactory condition having tolerated the procedure well.

## 2024-01-21 NOTE — Anesthesia Procedure Notes (Signed)
 Procedure Name: Intubation Date/Time: 01/21/2024 8:29 AM  Performed by: Memory Armida LABOR, CRNAPre-anesthesia Checklist: Patient identified, Emergency Drugs available, Suction available, Patient being monitored and Timeout performed Patient Re-evaluated:Patient Re-evaluated prior to induction Oxygen Delivery Method: Circle system utilized Preoxygenation: Pre-oxygenation with 100% oxygen Induction Type: IV induction Ventilation: Mask ventilation without difficulty Laryngoscope Size: Glidescope and 3 Grade View: Grade I Tube type: Parker flex tip Tube size: 7.5 mm Number of attempts: 1 Airway Equipment and Method: Video-laryngoscopy and Rigid stylet Placement Confirmation: ETT inserted through vocal cords under direct vision, positive ETCO2 and breath sounds checked- equal and bilateral Secured at: 21 cm Tube secured with: Tape Dental Injury: Teeth and Oropharynx as per pre-operative assessment  Comments: Elective Glidescope intubation due to pt's history of C4-7 neck fusion and decreased range of motion. Smooth IV induction. Easy mask. DL X 1 Glidescope #3. Grade 1 view. ATOI. BBS=. +ETCO2. ETT secured

## 2024-01-21 NOTE — Anesthesia Postprocedure Evaluation (Signed)
 Anesthesia Post Note  Patient: Kristen [name]  Procedure(s) Performed: COLECTOMY, SIGMOID, ROBOT-ASSISTED RESECTION, RECTUM, LOW ANTERIOR, ROBOT-ASSISTED SIGMOIDOSCOPY, FLEXIBLE     Patient location during evaluation: PACU Anesthesia Type: General Level of consciousness: awake and alert Pain management: pain level controlled Vital Signs Assessment: post-procedure vital signs reviewed and stable Respiratory status: spontaneous breathing, nonlabored ventilation, respiratory function stable and patient connected to nasal cannula oxygen Cardiovascular status: blood pressure returned to baseline and stable Postop Assessment: no apparent nausea or vomiting Anesthetic complications: no   No notable events documented.  Last Vitals:  Vitals:   01/21/24 1550 01/21/24 1731  BP: 129/80 121/78  Pulse: 78 80  Resp: 16 16  Temp: 36.8 C 36.7 C  SpO2: 97% 98%    Last Pain:  Vitals:   01/21/24 1731  TempSrc: Oral  PainSc:                  Adesuwa Osgood E

## 2024-01-21 NOTE — Anesthesia Preprocedure Evaluation (Addendum)
 Anesthesia Evaluation  Patient identified by MRN, date of birth, ID band Patient awake    Reviewed: Allergy & Precautions, NPO status , Patient's Chart, lab work & pertinent test results  History of Anesthesia Complications (+) PONV and history of anesthetic complications  Airway Mallampati: II  TM Distance: >3 FB Neck ROM: Full    Dental  (+) Dental Advisory Given   Pulmonary asthma , former smoker   breath sounds clear to auscultation       Cardiovascular hypertension, Pt. on medications  Rhythm:Regular Rate:Normal     Neuro/Psych  PSYCHIATRIC DISORDERS Anxiety Depression Bipolar Disorder   negative neurological ROS     GI/Hepatic Neg liver ROS, hiatal hernia,GERD  ,,  Endo/Other  diabetes, Type 2, Oral Hypoglycemic AgentsHypothyroidism    Renal/GU Renal disease     Musculoskeletal  (+) Arthritis ,    Abdominal   Peds  Hematology negative hematology ROS (+)   Anesthesia Other Findings   Reproductive/Obstetrics                              Anesthesia Physical Anesthesia Plan  ASA: 2  Anesthesia Plan: General   Post-op Pain Management: Tylenol  PO (pre-op)*, Ketamine  IV*, Toradol  IV (intra-op)* and Lidocaine  infusion*   Induction: Intravenous  PONV Risk Score and Plan: 4 or greater and Ondansetron , Dexamethasone , Propofol  infusion, Midazolam  and Scopolamine  patch - Pre-op  Airway Management Planned: Oral ETT  Additional Equipment: None  Intra-op Plan:   Post-operative Plan: Extubation in OR  Informed Consent: I have reviewed the patients History and Physical, chart, labs and discussed the procedure including the risks, benefits and alternatives for the proposed anesthesia with the patient or authorized representative who has indicated his/her understanding and acceptance.     Dental advisory given  Plan Discussed with: CRNA  Anesthesia Plan Comments:           Anesthesia Quick Evaluation

## 2024-01-21 NOTE — Discharge Instructions (Signed)

## 2024-01-22 ENCOUNTER — Encounter (HOSPITAL_COMMUNITY): Payer: Self-pay | Admitting: Surgery

## 2024-01-22 ENCOUNTER — Other Ambulatory Visit (HOSPITAL_COMMUNITY): Payer: Self-pay

## 2024-01-22 LAB — CBC
HCT: 33.2 % — ABNORMAL LOW (ref 36.0–46.0)
Hemoglobin: 10.8 g/dL — ABNORMAL LOW (ref 12.0–15.0)
MCH: 28.9 pg (ref 26.0–34.0)
MCHC: 32.5 g/dL (ref 30.0–36.0)
MCV: 88.8 fL (ref 80.0–100.0)
Platelets: 350 K/uL (ref 150–400)
RBC: 3.74 MIL/uL — ABNORMAL LOW (ref 3.87–5.11)
RDW: 14.1 % (ref 11.5–15.5)
WBC: 12.6 K/uL — ABNORMAL HIGH (ref 4.0–10.5)
nRBC: 0 % (ref 0.0–0.2)

## 2024-01-22 LAB — BASIC METABOLIC PANEL WITH GFR
Anion gap: 12 (ref 5–15)
BUN: 11 mg/dL (ref 6–20)
CO2: 24 mmol/L (ref 22–32)
Calcium: 9.5 mg/dL (ref 8.9–10.3)
Chloride: 106 mmol/L (ref 98–111)
Creatinine, Ser: 0.86 mg/dL (ref 0.44–1.00)
GFR, Estimated: >60 mL/min (ref >60)
Glucose, Bld: 75 mg/dL (ref 70–99)
Potassium: 3.3 mmol/L — ABNORMAL LOW (ref 3.5–5.1)
Sodium: 142 mmol/L (ref 135–145)

## 2024-01-22 LAB — SURGICAL PATHOLOGY

## 2024-01-22 LAB — GLUCOSE, CAPILLARY
Glucose-Capillary: 117 mg/dL — ABNORMAL HIGH (ref 70–99)
Glucose-Capillary: 91 mg/dL (ref 70–99)
Glucose-Capillary: 101 mg/dL — ABNORMAL HIGH (ref 70–99)
Glucose-Capillary: 85 mg/dL (ref 70–99)

## 2024-01-22 MED ORDER — POTASSIUM CHLORIDE CRYS ER 20 MEQ PO TBCR
40.0000 meq | EXTENDED_RELEASE_TABLET | Freq: Once | ORAL | Status: AC
  Administered 2024-01-22: 40 meq via ORAL
  Filled 2024-01-22: qty 2

## 2024-01-22 NOTE — Plan of Care (Signed)
  Problem: Education: Goal: Understanding of discharge needs will improve Outcome: Progressing Goal: Verbalization of understanding of the causes of altered bowel function will improve Outcome: Progressing   Problem: Activity: Goal: Ability to tolerate increased activity will improve Outcome: Progressing   Problem: Bowel/Gastric: Goal: Gastrointestinal status for postoperative course will improve Outcome: Progressing   Problem: Health Behavior/Discharge Planning: Goal: Identification of community resources to assist with postoperative recovery needs will improve Outcome: Progressing   Problem: Nutritional: Goal: Will attain and maintain optimal nutritional status will improve Outcome: Progressing   Problem: Clinical Measurements: Goal: Postoperative complications will be avoided or minimized Outcome: Progressing   Problem: Respiratory: Goal: Respiratory status will improve Outcome: Progressing   Problem: Skin Integrity: Goal: Will show signs of wound healing Outcome: Progressing   Problem: Education: Goal: Knowledge of General Education information will improve Description: Including pain rating scale, medication(s)/side effects and non-pharmacologic comfort measures Outcome: Progressing   Problem: Health Behavior/Discharge Planning: Goal: Ability to manage health-related needs will improve Outcome: Progressing   Problem: Clinical Measurements: Goal: Ability to maintain clinical measurements within normal limits will improve Outcome: Progressing Goal: Will remain free from infection Outcome: Progressing Goal: Diagnostic test results will improve Outcome: Progressing Goal: Respiratory complications will improve Outcome: Progressing Goal: Cardiovascular complication will be avoided Outcome: Progressing   Problem: Activity: Goal: Risk for activity intolerance will decrease Outcome: Progressing   Problem: Nutrition: Goal: Adequate nutrition will be  maintained Outcome: Progressing   Problem: Coping: Goal: Level of anxiety will decrease Outcome: Progressing   Problem: Elimination: Goal: Will not experience complications related to bowel motility Outcome: Progressing Goal: Will not experience complications related to urinary retention Outcome: Progressing   Problem: Pain Managment: Goal: General experience of comfort will improve and/or be controlled Outcome: Progressing   Problem: Safety: Goal: Ability to remain free from injury will improve Outcome: Progressing   Problem: Skin Integrity: Goal: Risk for impaired skin integrity will decrease Outcome: Progressing   Problem: Education: Goal: Ability to describe self-care measures that may prevent or decrease complications (Diabetes Survival Skills Education) will improve Outcome: Progressing Goal: Individualized Educational Video(s) Outcome: Progressing   Problem: Coping: Goal: Ability to adjust to condition or change in health will improve Outcome: Progressing   Problem: Fluid Volume: Goal: Ability to maintain a balanced intake and output will improve Outcome: Progressing   Problem: Health Behavior/Discharge Planning: Goal: Ability to identify and utilize available resources and services will improve Outcome: Progressing Goal: Ability to manage health-related needs will improve Outcome: Progressing   Problem: Metabolic: Goal: Ability to maintain appropriate glucose levels will improve Outcome: Progressing   Problem: Nutritional: Goal: Maintenance of adequate nutrition will improve Outcome: Progressing Goal: Progress toward achieving an optimal weight will improve Outcome: Progressing   Problem: Skin Integrity: Goal: Risk for impaired skin integrity will decrease Outcome: Progressing   Problem: Tissue Perfusion: Goal: Adequacy of tissue perfusion will improve Outcome: Progressing

## 2024-01-22 NOTE — Plan of Care (Signed)
  Problem: Metabolic: Goal: Ability to maintain appropriate glucose levels will improve Outcome: Progressing   Problem: Nutritional: Goal: Maintenance of adequate nutrition will improve Outcome: Progressing

## 2024-01-22 NOTE — Progress Notes (Signed)
°   01/22/24 1049  TOC Brief Assessment  Insurance and Status Reviewed  Patient has primary care physician Yes  Home environment has been reviewed home with spouse  Prior level of function: independent  Prior/Current Home Services No current home services  Social Drivers of Health Review SDOH reviewed no interventions necessary  Readmission risk has been reviewed Yes  Transition of care needs no transition of care needs at this time

## 2024-01-22 NOTE — Plan of Care (Signed)
   Problem: Education: Goal: Understanding of discharge needs will improve Outcome: Progressing   Problem: Education: Goal: Knowledge of General Education information will improve Description: Including pain rating scale, medication(s)/side effects and non-pharmacologic comfort measures Outcome: Progressing

## 2024-01-22 NOTE — Progress Notes (Signed)
 Subjective No acute events. Slept well, feels quite well. Ambulating, having bowel movements. Tolerating soft without n/v.   Objective: Vital signs in last 24 hours: Temp:  [97 F (36.1 C)-98.6 F (37 C)] 98.1 F (36.7 C) (12/11 0618) Pulse Rate:  [65-94] 66 (12/11 0618) Resp:  [14-19] 16 (12/11 0618) BP: (108-138)/(69-84) 115/72 (12/11 0618) SpO2:  [92 %-100 %] 99 % (12/11 0618) Weight:  [98.3 kg] 98.3 kg (12/11 0449) Last BM Date : 01/22/24  Intake/Output from previous day: 12/10 0701 - 12/11 0700 In: 3828.6 [P.O.:980; I.V.:2648.6; IV Piggyback:200] Out: 3485 [Urine:3475; Blood:10] Intake/Output this shift: Total I/O In: -  Out: 700 [Urine:700]  Gen: NAD, comfortable CV: RRR Pulm: Normal work of breathing Abd: Soft, minimal tenderness, nondistended Ext: SCDs in place  Lab Results: CBC  Recent Labs    01/22/24 0506  WBC 12.6*  HGB 10.8*  HCT 33.2*  PLT 350   BMET Recent Labs    01/22/24 0506  NA 142  K 3.3*  CL 106  CO2 24  GLUCOSE 75  BUN 11  CREATININE 0.86  CALCIUM  9.5   PT/INR No results for input(s): LABPROT, INR in the last 72 hours. ABG No results for input(s): PHART, HCO3 in the last 72 hours.  Invalid input(s): PCO2, PO2  Studies/Results:  Anti-infectives: Anti-infectives (From admission, onward)    Start     Dose/Rate Route Frequency Ordered Stop   01/22/24 0700  valACYclovir  (VALTREX ) tablet 500 mg        500 mg Oral Every morning 01/21/24 1216     01/21/24 0645  cefoTEtan  (CEFOTAN ) 2 g in sodium chloride  0.9 % 100 mL IVPB        2 g 200 mL/hr over 30 Minutes Intravenous On call to O.R. 01/21/24 0633 01/21/24 0901        Assessment/Plan: Patient Active Problem List   Diagnosis Date Noted   S/P laparoscopic-assisted sigmoidectomy 01/21/2024   Diverticulitis of colon without hemorrhage 12/25/2021   Hypertension 11/20/2021   Left sided abdominal pain 11/09/2021   Diverticulitis of both large and small intestine  05/30/2021   Abdominal wall abscess 05/24/2021   Benign paroxysmal positional vertigo 01/25/2021   Anemia 01/09/2021   Chronic constipation 01/09/2021   Gastroesophageal reflux disease 01/09/2021   Chronic cough 01/09/2021   Abscess, gluteal, left 12/28/2020   Periumbilical abdominal pain 07/31/2020   Change in bowel habits 07/31/2020   Abdominal mass, RUQ (right upper quadrant) 03/25/2020   Shortness of breath    Leukocytosis 06/10/2019   Nausea without vomiting 04/19/2019   Dysphagia 02/08/2019   Hoarseness 02/08/2019   Bloating    Family history of colon cancer in father    Dysphonia 11/19/2018   Laryngopharyngeal reflux (LPR) 11/19/2018   Injury of kidney 07/10/2018   Lithium  toxicity 07/10/2018   Disorder of brain 07/10/2018   Retention of urine 07/10/2018   Acquired hypothyroidism 06/03/2018   Multiple myeloma not having achieved remission (HCC) 01/21/2018   Generalized anxiety disorder 12/05/2017   Insomnia 12/05/2017   Bipolar disorder (HCC) 12/05/2017   Retaining fluid 02/21/2017   Mixed hyperlipidemia 02/11/2017   Morbid obesity (HCC) 12/06/2015   Anxiety 11/15/2015   NAFLD (nonalcoholic fatty liver disease) 89/95/7982   History of kidney stones 11/15/2015   Other hyperlipidemia 11/15/2015   Diabetes mellitus (HCC) 11/15/2015   Pseudoarthrosis of cervical spine (HCC) 08/18/2013   Status post cervical spinal arthrodesis 08/18/2013   Sleep disorder 07/20/2012   Chronic rhinitis 07/20/2012   Non Hodgkin's  lymphoma (HCC) 02/22/2008   s/p Procedures: COLECTOMY, SIGMOID, ROBOT-ASSISTED RESECTION, RECTUM, LOW ANTERIOR, ROBOT-ASSISTED SIGMOIDOSCOPY, FLEXIBLE 01/21/2024  - Doing fantastic - Ambulating 5x/day as able - D/C IVF, foley removed earlier, already voided. Having flatus and 3 Bms now. No blood in her stool - Tolerating soft diet - Hypokalemia - mild - will give 40 mEq Kdur PO this AM - PPX: SQH, SCDs  - She is highly motivated to go home today if  possible. I think this is reasonable if she is doing well this morning , potentially home after lunch time  - We reviewed her procedure, findings, plans and postoperative expectations as well as things to watch out for. All of her questions were answered, she expressed understanding and agreement with the plan   LOS: 1 day    Lonni Pizza, MD [facility]

## 2024-01-22 NOTE — Care Management Important Message (Signed)
 Important Message  Patient Details  Name: Kristen [name] MRN: [id number] Date of Birth: 12/10/1970   Important Message Given:  Yes - Medicare IM (reviewed letter telephonically at [phone])     Duwaine LITTIE Ada 01/22/2024, 10:56 AM

## 2024-01-23 LAB — BASIC METABOLIC PANEL WITH GFR
Anion gap: 12 (ref 5–15)
BUN: 12 mg/dL (ref 6–20)
CO2: 25 mmol/L (ref 22–32)
Calcium: 9.9 mg/dL (ref 8.9–10.3)
Chloride: 106 mmol/L (ref 98–111)
Creatinine, Ser: 0.83 mg/dL (ref 0.44–1.00)
GFR, Estimated: >60 mL/min (ref >60)
Glucose, Bld: 89 mg/dL (ref 70–99)
Potassium: 4.1 mmol/L (ref 3.5–5.1)
Sodium: 143 mmol/L (ref 135–145)

## 2024-01-23 LAB — CBC
HCT: 35.5 % — ABNORMAL LOW (ref 36.0–46.0)
Hemoglobin: 11.3 g/dL — ABNORMAL LOW (ref 12.0–15.0)
MCH: 28.9 pg (ref 26.0–34.0)
MCHC: 31.8 g/dL (ref 30.0–36.0)
MCV: 90.8 fL (ref 80.0–100.0)
Platelets: 340 K/uL (ref 150–400)
RBC: 3.91 MIL/uL (ref 3.87–5.11)
RDW: 14.3 % (ref 11.5–15.5)
WBC: 8.3 K/uL (ref 4.0–10.5)
nRBC: 0 % (ref 0.0–0.2)

## 2024-01-23 LAB — GLUCOSE, CAPILLARY: Glucose-Capillary: 100 mg/dL — ABNORMAL HIGH (ref 70–99)

## 2024-01-23 NOTE — Progress Notes (Signed)
 Subjective No acute events. Feels great. Ambulating, having bowel movements. Tolerating soft without n/v. Continues having flatus and bm  Objective: Vital signs in last 24 hours: Temp:  [97.9 F (36.6 C)-98.3 F (36.8 C)] 97.9 F (36.6 C) (12/12 0502) Pulse Rate:  [58-79] 58 (12/12 0502) Resp:  [15-16] 15 (12/12 0502) BP: (110-121)/(61-68) 111/63 (12/12 0502) SpO2:  [97 %-99 %] 98 % (12/12 0502) Weight:  [103.4 kg] 103.4 kg (12/12 0502) Last BM Date : 01/22/24  Intake/Output from previous day: 12/11 0701 - 12/12 0700 In: 900 [P.O.:900] Out: 1050 [Urine:1050] Intake/Output this shift: No intake/output data recorded.  Gen: NAD, comfortable CV: RRR Pulm: Normal work of breathing Abd: Soft, nontender, nondistended. Incisions c/d/I without erythema/drainage. No palpable hernias Ext: SCDs in place  Lab Results: CBC  Recent Labs    01/22/24 0506 01/23/24 0426  WBC 12.6* 8.3  HGB 10.8* 11.3*  HCT 33.2* 35.5*  PLT 350 340   BMET Recent Labs    01/22/24 0506 01/23/24 0426  NA 142 143  K 3.3* 4.1  CL 106 106  CO2 24 25  GLUCOSE 75 89  BUN 11 12  CREATININE 0.86 0.83  CALCIUM  9.5 9.9   PT/INR No results for input(s): LABPROT, INR in the last 72 hours. ABG No results for input(s): PHART, HCO3 in the last 72 hours.  Invalid input(s): PCO2, PO2  Studies/Results:  Anti-infectives: Anti-infectives (From admission, onward)    Start     Dose/Rate Route Frequency Ordered Stop   01/22/24 0700  valACYclovir  (VALTREX ) tablet 500 mg        500 mg Oral Every morning 01/21/24 1216     01/21/24 0645  cefoTEtan  (CEFOTAN ) 2 g in sodium chloride  0.9 % 100 mL IVPB        2 g 200 mL/hr over 30 Minutes Intravenous On call to O.R. 01/21/24 0633 01/21/24 0901        Assessment/Plan: Patient Active Problem List   Diagnosis Date Noted   S/P laparoscopic-assisted sigmoidectomy 01/21/2024   Diverticulitis of colon without hemorrhage 12/25/2021   Hypertension  11/20/2021   Left sided abdominal pain 11/09/2021   Diverticulitis of both large and small intestine 05/30/2021   Abdominal wall abscess 05/24/2021   Benign paroxysmal positional vertigo 01/25/2021   Anemia 01/09/2021   Chronic constipation 01/09/2021   Gastroesophageal reflux disease 01/09/2021   Chronic cough 01/09/2021   Abscess, gluteal, left 12/28/2020   Periumbilical abdominal pain 07/31/2020   Change in bowel habits 07/31/2020   Abdominal mass, RUQ (right upper quadrant) 03/25/2020   Shortness of breath    Leukocytosis 06/10/2019   Nausea without vomiting 04/19/2019   Dysphagia 02/08/2019   Hoarseness 02/08/2019   Bloating    Family history of colon cancer in father    Dysphonia 11/19/2018   Laryngopharyngeal reflux (LPR) 11/19/2018   Injury of kidney 07/10/2018   Lithium  toxicity 07/10/2018   Disorder of brain 07/10/2018   Retention of urine 07/10/2018   Acquired hypothyroidism 06/03/2018   Multiple myeloma not having achieved remission (HCC) 01/21/2018   Generalized anxiety disorder 12/05/2017   Insomnia 12/05/2017   Bipolar disorder (HCC) 12/05/2017   Retaining fluid 02/21/2017   Mixed hyperlipidemia 02/11/2017   Morbid obesity (HCC) 12/06/2015   Anxiety 11/15/2015   NAFLD (nonalcoholic fatty liver disease) 89/95/7982   History of kidney stones 11/15/2015   Other hyperlipidemia 11/15/2015   Diabetes mellitus (HCC) 11/15/2015   Pseudoarthrosis of cervical spine (HCC) 08/18/2013   Status post cervical spinal arthrodesis  08/18/2013   Sleep disorder 07/20/2012   Chronic rhinitis 07/20/2012   Non Hodgkin's lymphoma (HCC) 02/22/2008   s/p Procedures: COLECTOMY, SIGMOID, ROBOT-ASSISTED RESECTION, RECTUM, LOW ANTERIOR, ROBOT-ASSISTED SIGMOIDOSCOPY, FLEXIBLE 01/21/2024  - Doing fantastic - Ambulating 5x/day as able - Tolerating soft diet - Hypokalemia resolved - PPX: SQH, SCDs  - had some increased soreness yesterday AM but this has resolved. No  complaints/concerns noted at present. Would like to go home today.    LOS: 2 days    Lonni Pizza, MD [facility]

## 2024-01-23 NOTE — Discharge Summary (Signed)
 Patient ID: Kristen [name] MRN: [id number] DOB/AGE: 1970/10/22 53 y.o.  Admit date: 01/21/2024 Discharge date: 01/23/2024  Discharge Diagnoses Patient Active Problem List   Diagnosis Date Noted   S/P laparoscopic-assisted sigmoidectomy 01/21/2024   Diverticulitis of colon without hemorrhage 12/25/2021   Hypertension 11/20/2021   Left sided abdominal pain 11/09/2021   Diverticulitis of both large and small intestine 05/30/2021   Abdominal wall abscess 05/24/2021   Benign paroxysmal positional vertigo 01/25/2021   Anemia 01/09/2021   Chronic constipation 01/09/2021   Gastroesophageal reflux disease 01/09/2021   Chronic cough 01/09/2021   Abscess, gluteal, left 12/28/2020   Periumbilical abdominal pain 07/31/2020   Change in bowel habits 07/31/2020   Abdominal mass, RUQ (right upper quadrant) 03/25/2020   Shortness of breath    Leukocytosis 06/10/2019   Nausea without vomiting 04/19/2019   Dysphagia 02/08/2019   Hoarseness 02/08/2019   Bloating    Family history of colon cancer in father    Dysphonia 11/19/2018   Laryngopharyngeal reflux (LPR) 11/19/2018   Injury of kidney 07/10/2018   Lithium  toxicity 07/10/2018   Disorder of brain 07/10/2018   Retention of urine 07/10/2018   Acquired hypothyroidism 06/03/2018   Multiple myeloma not having achieved remission (HCC) 01/21/2018   Generalized anxiety disorder 12/05/2017   Insomnia 12/05/2017   Bipolar disorder (HCC) 12/05/2017   Retaining fluid 02/21/2017   Mixed hyperlipidemia 02/11/2017   Morbid obesity (HCC) 12/06/2015   Anxiety 11/15/2015   NAFLD (nonalcoholic fatty liver disease) 89/95/7982   History of kidney stones 11/15/2015   Other hyperlipidemia 11/15/2015   Diabetes mellitus (HCC) 11/15/2015   Pseudoarthrosis of cervical spine (HCC) 08/18/2013   Status post cervical spinal arthrodesis 08/18/2013   Sleep disorder 07/20/2012   Chronic rhinitis 07/20/2012   Non Hodgkin's lymphoma (HCC) 02/22/2008     Consultants none  Procedures OR 01/21/24 Robotic-assisted anterior resection with double stapled coloproctostomy Intraoperative assessment of perfusion using ICG fluorescence imaging Flexible sigmoidoscopy Bilateral transversus abdominus plane (TAP) blocks FINDINGS: Erythematous/injected and thickened sigmoid colon which is highly redundant.  Diverticulosis.  A well perfused, tension free, hemostatic, air tight 31 mm EEA colorectal anastomosis fashioned 15 cm from the anal verge by flexible sigmoidoscopy.   Pathology A. COLON, RECTOSIGMOID, RESECTION:  Diverticulosis.  Two benign lymph nodes (0/2)    Hospital Course: She was admitted postoperatively and recovered well. Her diet was advanced which she tolerated.  She began having spontaneous return of bowel function.  She was mobilizing well on her own.  On 01/23/2024, she is comfortable with and stable for discharge home.  Postoperative expectations have been reviewed.  Follow-up has been arranged.  All of her questions were answered, she expressed understanding, and agreement with the plan.   Allergies as of 01/23/2024       Reactions   Iohexol  Hives    Desc: IVP DYE Also developed hives when drinking this contrast responded to benadryl .  Desc: IVP DYE Desc: IVP DYE Also developed hives when drinking this contrast responded to benadryl . Was pre medicated with prednisone  and benadryl  and did okay   Rosuvastatin  Other (See Comments)   Muscle aches with 10mg  and 20mg , tolerates 5mg  daily without issues Muscle aches with 20mg  Muscle aches with 10mg  and 20mg , tolerates 5mg  daily without issues   Diatrizoate Other (See Comments)   Oxycodone -acetaminophen  Itching        Medication List     TAKE these medications    acetaminophen  500 MG tablet Commonly known as: TYLENOL  Take 1,000 mg by  mouth every 6 (six) hours as needed for moderate pain.   albuterol  108 (90 Base) MCG/ACT inhaler Commonly known as: VENTOLIN   HFA Inhale 2 puffs into the lungs in the morning.   albuterol  (2.5 MG/3ML) 0.083% nebulizer solution Commonly known as: PROVENTIL  Take 2.5 mg by nebulization every 2 (two) hours as needed for shortness of breath.   Align 10 MG Caps Take 10 mg by mouth at bedtime. Digestive Support   ALPRAZolam  1 MG tablet Commonly known as: XANAX  Take 1 tablet (1 mg total) by mouth every 6 (six) hours as needed for anxiety. What changed: when to take this   Auvelity  45-105 MG Tbcr Generic drug: Dextromethorphan-buPROPion  ER Take 1 tablet by mouth 2 (two) times daily.   busPIRone  15 MG tablet Commonly known as: BUSPAR  Take 2 tablets (30 mg total) by mouth 2 (two) times daily.   cyanocobalamin  1000 MCG/ML injection Commonly known as: VITAMIN B12 Inject 1 mL (1,000 mcg total) into the muscle once a week. 1 ml weekly X 4 and monthly X 6 What changed: additional instructions   cyclobenzaprine  10 MG tablet Commonly known as: FLEXERIL  Take 10 mg by mouth at bedtime.   dicyclomine  20 MG tablet Commonly known as: BENTYL  Take 1 tablet (20 mg total) by mouth 2 (two) times daily.   docusate sodium  100 MG capsule Commonly known as: COLACE Take 100 mg by mouth at bedtime.   famotidine  40 MG tablet Commonly known as: PEPCID  Take 1 tablet (40 mg total) by mouth daily before supper.   fenofibrate  145 MG tablet Commonly known as: TRICOR  Take 1 tablet (145 mg total) by mouth daily.   fexofenadine 180 MG tablet Commonly known as: ALLEGRA Take 180 mg by mouth daily.   hydrOXYzine  50 MG tablet Commonly known as: ATARAX  TAKE 1 TABLET BY MOUTH EVERY 6 HOURS AS NEEDED   Ibsrela  50 MG Tabs Generic drug: Tenapanor HCl Take 1 tablet by mouth 2 (two) times daily. What changed: how much to take   ipratropium 0.03 % nasal spray Commonly known as: ATROVENT  Place 2 sprays into both nostrils 2 (two) times daily.   lamoTRIgine  200 MG tablet Commonly known as: LAMICTAL  TAKE 2 & 1/2 (TWO & ONE-HALF)  TABLETS BY MOUTH ONCE DAILY What changed: See the new instructions.   levothyroxine  75 MCG tablet Commonly known as: SYNTHROID  Take 75 mcg by mouth daily before breakfast.   Luer Lock Safety Syringes 23G X 1 3 ML Misc Generic drug: SYRINGE-NEEDLE (DISP) 3 ML To be used with B 12 injections   Melatonin 10 MG Tabs Take 10 mg by mouth at bedtime.   metFORMIN  750 MG 24 hr tablet Commonly known as: GLUCOPHAGE -XR Take 750 mg by mouth daily with breakfast.   OLANZapine  5 MG tablet Commonly known as: ZYPREXA  1 po at bedtime for 2 nights, then 2 po at bedtime.   ondansetron  8 MG disintegrating tablet Commonly known as: ZOFRAN -ODT Take 1 tablet (8 mg total) by mouth 2 (two) times daily as needed for nausea or vomiting.   ondansetron  8 MG tablet Commonly known as: ZOFRAN  Take 1 tablet (8 mg total) by mouth every 8 (eight) hours as needed for nausea or vomiting.   Oxcarbazepine  300 MG tablet Commonly known as: TRILEPTAL  Take 2 tablets by mouth twice daily What changed:  how much to take when to take this additional instructions   pantoprazole  40 MG tablet Commonly known as: PROTONIX  Take 1 tablet (40 mg total) by mouth daily before breakfast.  polyethylene glycol 17 g packet Commonly known as: MIRALAX  / GLYCOLAX  Take 17 g by mouth daily as needed for severe constipation.   rosuvastatin  5 MG tablet Commonly known as: CRESTOR  Take 1 tablet (5 mg total) by mouth daily. What changed: when to take this   Rybelsus  7 MG Tabs Generic drug: Semaglutide  Take 7 mg by mouth daily before breakfast. 30 min   sertraline  100 MG tablet Commonly known as: ZOLOFT  Take 3 tablets (300 mg total) by mouth at bedtime.   tamsulosin  0.4 MG Caps capsule Commonly known as: FLOMAX  Take 1 capsule (0.4 mg total) by mouth daily.   traMADol  50 MG tablet Commonly known as: Ultram  Take 1 tablet (50 mg total) by mouth every 6 (six) hours as needed for up to 5 days (postop pain not controlled with  tylenol  and ibuprofen  first).   valACYclovir  500 MG tablet Commonly known as: VALTREX  Take 500 mg by mouth in the morning.          Follow-up Information     Teresa Lonni HERO, MD Follow up on 02/17/2024.   Specialties: General Surgery, Colon and Rectal Surgery Why: Please arrive by 8:30 am Contact information: [address] [place] [address] [phone]                 Lonni HERO. Teresa, M.D. [facility]

## 2024-01-23 NOTE — Progress Notes (Signed)
 Patient discharged home, IV removed, discharge paperwork provided and explained, patient verbalized understanding.

## 2024-02-02 NOTE — Telephone Encounter (Signed)
 Has appt 1/20. Pt wanted provider to know reason for late follow up is due to her having surgery.

## 2024-02-09 ENCOUNTER — Encounter: Payer: Self-pay | Admitting: *Deleted

## 2024-02-11 ENCOUNTER — Inpatient Hospital Stay: Attending: Oncology

## 2024-02-11 DIAGNOSIS — E538 Deficiency of other specified B group vitamins: Secondary | ICD-10-CM

## 2024-02-11 DIAGNOSIS — Z79899 Other long term (current) drug therapy: Secondary | ICD-10-CM | POA: Insufficient documentation

## 2024-02-11 DIAGNOSIS — D649 Anemia, unspecified: Secondary | ICD-10-CM

## 2024-02-11 DIAGNOSIS — D75839 Thrombocytosis, unspecified: Secondary | ICD-10-CM | POA: Diagnosis present

## 2024-02-11 DIAGNOSIS — D509 Iron deficiency anemia, unspecified: Secondary | ICD-10-CM

## 2024-02-11 DIAGNOSIS — D72829 Elevated white blood cell count, unspecified: Secondary | ICD-10-CM | POA: Insufficient documentation

## 2024-02-11 LAB — VITAMIN B12: Vitamin B-12: 1180 pg/mL — ABNORMAL HIGH (ref 180–914)

## 2024-02-11 LAB — COMPREHENSIVE METABOLIC PANEL WITH GFR
ALT: 27 U/L (ref 0–44)
AST: 29 U/L (ref 15–41)
Albumin: 4.8 g/dL (ref 3.5–5.0)
Alkaline Phosphatase: 107 U/L (ref 38–126)
Anion gap: 16 — ABNORMAL HIGH (ref 5–15)
BUN: 27 mg/dL — ABNORMAL HIGH (ref 6–20)
CO2: 22 mmol/L (ref 22–32)
Calcium: 9.6 mg/dL (ref 8.9–10.3)
Chloride: 103 mmol/L (ref 98–111)
Creatinine, Ser: 0.89 mg/dL (ref 0.44–1.00)
GFR, Estimated: >60 mL/min
Glucose, Bld: 92 mg/dL (ref 70–99)
Potassium: 4 mmol/L (ref 3.5–5.1)
Sodium: 140 mmol/L (ref 135–145)
Total Bilirubin: 0.3 mg/dL (ref 0.0–1.2)
Total Protein: 7.3 g/dL (ref 6.5–8.1)

## 2024-02-11 LAB — IRON AND TIBC
Iron: 74 ug/dL (ref 28–170)
Saturation Ratios: 19 % (ref 10.4–31.8)
TIBC: 393 ug/dL (ref 250–450)
UIBC: 320 ug/dL

## 2024-02-11 LAB — CBC WITH DIFFERENTIAL/PLATELET
Abs Immature Granulocytes: 0.15 K/uL — ABNORMAL HIGH (ref 0.00–0.07)
Basophils Absolute: 0.1 K/uL (ref 0.0–0.1)
Basophils Relative: 1 %
Eosinophils Absolute: 0.3 K/uL (ref 0.0–0.5)
Eosinophils Relative: 2 %
HCT: 37.1 % (ref 36.0–46.0)
Hemoglobin: 12.2 g/dL (ref 12.0–15.0)
Immature Granulocytes: 1 %
Lymphocytes Relative: 19 %
Lymphs Abs: 2.1 K/uL (ref 0.7–4.0)
MCH: 29.5 pg (ref 26.0–34.0)
MCHC: 32.9 g/dL (ref 30.0–36.0)
MCV: 89.6 fL (ref 80.0–100.0)
Monocytes Absolute: 1.3 K/uL — ABNORMAL HIGH (ref 0.1–1.0)
Monocytes Relative: 12 %
Neutro Abs: 7 K/uL (ref 1.7–7.7)
Neutrophils Relative %: 65 %
Platelets: 422 K/uL — ABNORMAL HIGH (ref 150–400)
RBC: 4.14 MIL/uL (ref 3.87–5.11)
RDW: 14.9 % (ref 11.5–15.5)
WBC: 10.9 K/uL — ABNORMAL HIGH (ref 4.0–10.5)
nRBC: 0 % (ref 0.0–0.2)

## 2024-02-11 LAB — FERRITIN: Ferritin: 1881 ng/mL — ABNORMAL HIGH (ref 11–307)

## 2024-02-11 LAB — FOLATE: Folate: 12.4 ng/mL

## 2024-02-15 LAB — METHYLMALONIC ACID, SERUM: Methylmalonic Acid, Quantitative: 244 nmol/L (ref 0–378)

## 2024-02-16 ENCOUNTER — Other Ambulatory Visit: Payer: Self-pay | Admitting: Physician Assistant

## 2024-02-18 ENCOUNTER — Inpatient Hospital Stay: Attending: Oncology | Admitting: Oncology

## 2024-02-18 VITALS — BP 127/74 | HR 75 | Temp 97.7°F | Resp 20 | Wt 222.2 lb

## 2024-02-18 DIAGNOSIS — K5730 Diverticulosis of large intestine without perforation or abscess without bleeding: Secondary | ICD-10-CM | POA: Diagnosis not present

## 2024-02-18 DIAGNOSIS — Z9049 Acquired absence of other specified parts of digestive tract: Secondary | ICD-10-CM | POA: Insufficient documentation

## 2024-02-18 DIAGNOSIS — K5900 Constipation, unspecified: Secondary | ICD-10-CM | POA: Insufficient documentation

## 2024-02-18 DIAGNOSIS — D509 Iron deficiency anemia, unspecified: Secondary | ICD-10-CM | POA: Diagnosis not present

## 2024-02-18 DIAGNOSIS — Z79899 Other long term (current) drug therapy: Secondary | ICD-10-CM | POA: Insufficient documentation

## 2024-02-18 DIAGNOSIS — R519 Headache, unspecified: Secondary | ICD-10-CM | POA: Insufficient documentation

## 2024-02-18 DIAGNOSIS — K5792 Diverticulitis of intestine, part unspecified, without perforation or abscess without bleeding: Secondary | ICD-10-CM | POA: Diagnosis not present

## 2024-02-18 DIAGNOSIS — D72829 Elevated white blood cell count, unspecified: Secondary | ICD-10-CM | POA: Insufficient documentation

## 2024-02-18 DIAGNOSIS — D75839 Thrombocytosis, unspecified: Secondary | ICD-10-CM | POA: Insufficient documentation

## 2024-02-18 DIAGNOSIS — R7989 Other specified abnormal findings of blood chemistry: Secondary | ICD-10-CM | POA: Insufficient documentation

## 2024-02-18 NOTE — Assessment & Plan Note (Addendum)
 She received 1 B12 injection in clinic on 11/29/2022 and self administers B12 monthly at home. Repeat B12 levels are elevated at 1180 but reduced from previous.   Continue B12 injections monthly at home.

## 2024-02-18 NOTE — Progress Notes (Signed)
 "  [facility] OFFICE PROGRESS NOTE  Nena Rosina CROME, PA-C  ASSESSMENT & PLAN:  Assessment & Plan Thrombocytosis 1.  Neutrophilic leukocytosis and thrombocytosis - JAK2 CALR, and MPL testing was negative; negative BCR/ABL FISH testing; normal LDH, negative SPEP testing.  - History of intermittent neutrophilic leukocytosis since 2009, with WBC up to to 20.5 -Repeat JAK2 CALR and MPL testing was negative on 06/06/2022. - Ultrasound of the abdomen on 05/27/2019 showed probable fatty infiltration of the liver with minimal splenic enlargement measuring 12.1 cm in length with volume calculated to 502 mL.  - She continues to have diverticulitis flares most recently treated in November at [facility] ED. -She is followed by gastroenterology and her last colonoscopy was in October of 2022 which showed diverticulosis of the entire colon without evidence of diverticulitis.  No other abnormality. -She receives intermittent IV iron  last given on 11/21/2023 and 11/28/2023 510 mg Feraheme along with B12 shots and her minimally. -Has intermittent headaches and dizziness but otherwise no hyperviscosity symptoms.  Reports this is chronic for her. 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 - Labs from 02/11/2024 show iron  saturations 19%, normal TIBC, elevated ferritin 1881, elevated B12 1180, normal hemoglobin 12.2 with an elevated platelet count 422.  WBC 10.9.  CMP unremarkable.  MMA and folate level are both within normal range. --RTC in 6 weeks with labs only and in 12 weeks with labs and see NP. Elevated vitamin B12 level She received 1 B12 injection in clinic on 11/29/2022 and self administers B12 monthly at home. Repeat B12 levels are elevated at 1180 but reduced from previous.   Continue B12 injections monthly at home.   Diverticulitis Followed by GI for this. Had a flare in August 2025. Status post laparoscopic  assisted sigmoidectomy on 01/21/2024. Reports occasional constipation so she does take MiraLAX  as needed.  She is currently having normal bowel movements. Reports her surgeon discharged her from clinic yesterday. Symptoms have improved since her surgery. Iron  deficiency anemia, unspecified iron  deficiency anemia type She is status post intermittent IV iron  with stable iron  levels. No additional IV iron  needed at this time. Given recent surgery, would recommend follow-up in 6 weeks with labs only to see if labs are holding steady versus additional infusions needed. Patient had quite a bit of her sigmoid removed which will likely decrease absorption of nutrients.  Orders Placed This Encounter  Procedures   Iron  and TIBC ([facility] ONLY)    Standing Status:   Future    Expected Date:   08/15/2024    Expiration Date:   09/24/2024   Ferritin    Standing Status:   Future    Expected Date:   08/15/2024    Expiration Date:   09/24/2024   Vitamin B12    Standing Status:   Future    Expected Date:   08/15/2024    Expiration Date:   09/24/2024   CBC with Differential    Standing Status:   Future    Expected Date:   08/15/2024    Expiration Date:   09/24/2024   Comprehensive metabolic panel    Standing Status:   Future    Expected Date:   08/15/2024    Expiration Date:   09/24/2024   Methylmalonic acid, serum    Standing Status:   Future    Expected Date:   08/15/2024    Expiration Date:   09/24/2024   Folate  Standing Status:   Future    Expected Date:   08/15/2024    Expiration Date:   09/24/2024   Iron  and TIBC ([facility] ONLY)    Standing Status:   Future    Expected Date:   05/24/2024    Expiration Date:   08/22/2024   Ferritin    Standing Status:   Future    Expected Date:   05/24/2024    Expiration Date:   08/22/2024   Vitamin B12    Standing Status:   Future    Expected Date:   05/24/2024    Expiration Date:   08/22/2024   CBC with Differential    Standing  Status:   Future    Expected Date:   05/24/2024    Expiration Date:   08/22/2024   Comprehensive metabolic panel    Standing Status:   Future    Expected Date:   05/24/2024    Expiration Date:   08/22/2024   Methylmalonic acid, serum    Standing Status:   Future    Expected Date:   05/24/2024    Expiration Date:   08/22/2024   Folate    Standing Status:   Future    Expected Date:   05/24/2024    Expiration Date:   08/22/2024    INTERVAL HISTORY: Patient returns for follow-up.Ms. Dirusso 54 y.o. female returns for routine follow-up of leukocytosis and iron  deficiency anemia.   At her last visit, she complained of abdominal pain with diarrhea.  We ordered a CT abdomen/pelvis on 08/25/2023 which showed duodenal and total colonic diverticulosis.  With mesenteric stranding along the mid to distal sigmoid colon.  While this could reflect changes from epiploic appendagitis it is worrisome for acute uncomplicated diverticulitis.   She was hospitalized on 11/03/2023 for left lower quadrant abdominal pain and severe constipation.  She was diagnosed with diverticulitis and was started on antibiotics.  Patient had lithotripsy with Dr. Devere on 11/07/2023.  Tolerated procedure well.  Patient had laparoscopic assisted sigmoidectomy on 01/21/24 and discharged on 01/23/2024.  Patient reports improvement of her abdominal pain since her surgery.  No recent issue.  She does take MiraLAX  to prevent constipation.  Reports that she was released from her surgeon yesterday.  No additional follow-up needed.  Denies any melena, hematochezia or bright red blood per rectum.  Patient received 1 dose of Feraheme on 09/19/2023 and 2 doses on 11/21/2023 and 11/28/2023.  She also received a B12 shot on 11/21/2023.  She gives herself B12 injections at home.  She is here to review her most recent lab results.   She is not taking iron  supplements at this time.  We reviewed iron  panel, ferritin, B12, CBC, CMP, MMA and  folate.  SUMMARY OF HEMATOLOGIC HISTORY: Oncology History   No problem history exists.     CBC    Component Value Date/Time   WBC 10.9 (H) 02/11/2024 1234   RBC 4.14 02/11/2024 1234   HGB 12.2 02/11/2024 1234   HGB 12.0 06/06/2022 1029   HGB 12.9 12/17/2019 1052   HCT 37.1 02/11/2024 1234   HCT 39.0 12/17/2019 1052   PLT 422 (H) 02/11/2024 1234   PLT 445 (H) 06/06/2022 1029   PLT 425 12/17/2019 1052   MCV 89.6 02/11/2024 1234   MCV 86 12/17/2019 1052   MCH 29.5 02/11/2024 1234   MCHC 32.9 02/11/2024 1234   RDW 14.9 02/11/2024 1234   RDW 13.5 12/17/2019 1052   LYMPHSABS 2.1 02/11/2024 1234  LYMPHSABS 3.0 12/17/2019 1052   MONOABS 1.3 (H) 02/11/2024 1234   EOSABS 0.3 02/11/2024 1234   EOSABS 0.8 (H) 12/17/2019 1052   BASOSABS 0.1 02/11/2024 1234   BASOSABS 0.1 12/17/2019 1052       Latest Ref Rng & Units 02/11/2024   12:34 PM 01/23/2024    4:26 AM 01/22/2024    5:06 AM  CMP  Glucose 70 - 99 mg/dL 92  89  75   BUN 6 - 20 mg/dL 27  12  11    Creatinine 0.44 - 1.00 mg/dL 9.10  9.16  9.13   Sodium 135 - 145 mmol/L 140  143  142   Potassium 3.5 - 5.1 mmol/L 4.0  4.1  3.3   Chloride 98 - 111 mmol/L 103  106  106   CO2 22 - 32 mmol/L 22  25  24    Calcium  8.9 - 10.3 mg/dL 9.6  9.9  9.5   Total Protein 6.5 - 8.1 g/dL 7.3     Total Bilirubin 0.0 - 1.2 mg/dL 0.3     Alkaline Phos 38 - 126 U/L 107     AST 15 - 41 U/L 29     ALT 0 - 44 U/L 27        Lab Results  Component Value Date   FERRITIN 1,881 (H) 02/11/2024   VITAMINB12 1,180 (H) 02/11/2024    Vitals:   02/18/24 1303  BP: 127/74  Pulse: 75  Resp: 20  Temp: 97.7 F (36.5 C)  SpO2: 97%    Review of System:  Review of Systems  Constitutional:  Positive for malaise/fatigue.  Cardiovascular:  Positive for leg swelling.  Gastrointestinal:  Positive for constipation and nausea.    Physical Exam: Physical Exam Constitutional:      Appearance: Normal appearance.  HENT:     Head: Normocephalic and  atraumatic.  Eyes:     Pupils: Pupils are equal, round, and reactive to light.  Cardiovascular:     Rate and Rhythm: Normal rate and regular rhythm.     Heart sounds: Normal heart sounds. No murmur heard. Pulmonary:     Effort: Pulmonary effort is normal.     Breath sounds: Normal breath sounds. No wheezing.  Abdominal:     General: Bowel sounds are normal. There is no distension.     Palpations: Abdomen is soft.     Tenderness: There is no abdominal tenderness.  Musculoskeletal:        General: Normal range of motion.     Cervical back: Normal range of motion.  Skin:    General: Skin is warm and dry.     Findings: No rash.  Neurological:     Mental Status: She is alert and oriented to person, place, and time.     Gait: Gait is intact.  Psychiatric:        Mood and Affect: Mood and affect normal.        Cognition and Memory: Memory normal.        Judgment: Judgment normal.      I spent 25 minutes dedicated to the care of this patient (face-to-face and non-face-to-face) on the date of the encounter to include what is described in the assessment and plan.,  Delon Hope, NP 02/24/2024 8:55 AM "

## 2024-02-18 NOTE — Assessment & Plan Note (Addendum)
 Followed by GI for this. Had a flare in August 2025. Status post laparoscopic assisted sigmoidectomy on 01/21/2024. Reports occasional constipation so she does take MiraLAX  as needed.  She is currently having normal bowel movements. Reports her surgeon discharged her from clinic yesterday. Symptoms have improved since her surgery.

## 2024-02-18 NOTE — Assessment & Plan Note (Addendum)
 1.  Neutrophilic leukocytosis and thrombocytosis - JAK2 CALR, and MPL testing was negative; negative BCR/ABL FISH testing; normal LDH, negative SPEP testing.  - History of intermittent neutrophilic leukocytosis since 2009, with WBC up to to 20.5 -Repeat JAK2 CALR and MPL testing was negative on 06/06/2022. - Ultrasound of the abdomen on 05/27/2019 showed probable fatty infiltration of the liver with minimal splenic enlargement measuring 12.1 cm in length with volume calculated to 502 mL.  - She continues to have diverticulitis flares most recently treated in November at [facility] ED. -She is followed by gastroenterology and her last colonoscopy was in October of 2022 which showed diverticulosis of the entire colon without evidence of diverticulitis.  No other abnormality. -She receives intermittent IV iron  last given on 11/21/2023 and 11/28/2023 510 mg Feraheme along with B12 shots and her minimally. -Has intermittent headaches and dizziness but otherwise no hyperviscosity symptoms.  Reports this is chronic for her. 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 - Labs from 02/11/2024 show iron  saturations 19%, normal TIBC, elevated ferritin 1881, elevated B12 1180, normal hemoglobin 12.2 with an elevated platelet count 422.  WBC 10.9.  CMP unremarkable.  MMA and folate level are both within normal range. --RTC in 6 weeks with labs only and in 12 weeks with labs and see NP.

## 2024-02-20 ENCOUNTER — Encounter: Payer: Self-pay | Admitting: Physician Assistant

## 2024-02-20 ENCOUNTER — Telehealth: Admitting: Physician Assistant

## 2024-02-20 ENCOUNTER — Telehealth: Payer: Self-pay | Admitting: Physician Assistant

## 2024-02-20 DIAGNOSIS — F319 Bipolar disorder, unspecified: Secondary | ICD-10-CM | POA: Diagnosis not present

## 2024-02-20 DIAGNOSIS — F5105 Insomnia due to other mental disorder: Secondary | ICD-10-CM | POA: Diagnosis not present

## 2024-02-20 DIAGNOSIS — Z79899 Other long term (current) drug therapy: Secondary | ICD-10-CM

## 2024-02-20 DIAGNOSIS — F99 Mental disorder, not otherwise specified: Secondary | ICD-10-CM

## 2024-02-20 DIAGNOSIS — F411 Generalized anxiety disorder: Secondary | ICD-10-CM | POA: Diagnosis not present

## 2024-02-20 MED ORDER — ALPRAZOLAM 1 MG PO TABS: 1.0000 mg | ORAL_TABLET | Freq: Four times a day (QID) | ORAL | 5 refills | Status: AC | PRN

## 2024-02-20 NOTE — Telephone Encounter (Signed)
 Lab orders mailed on 1/12

## 2024-02-20 NOTE — Progress Notes (Signed)
 "     [facility] Check  Patient ID: FRANKLIN CLAPSADDLE,  MRN: [id number]  PCP: Nena Rosina LITTIE DEVONNA  Date of Evaluation: 02/20/2024 Time spent:30 minutes  Chief Complaint:  Chief Complaint   Follow-up    Virtual Visit via Telehealth  I connected with patient by a video enabled telemedicine application with their informed consent, and verified patient privacy and that I am speaking with the correct person using two identifiers.  I am private, in my office and the patient is at home.  I discussed the limitations, risks, security and privacy concerns of performing an evaluation and management service by video and the availability of in person appointments. I also discussed with the patient that there may be a patient responsible charge related to this service. The patient expressed understanding and agreed to proceed.   I discussed the assessment and treatment plan with the patient. The patient was provided an opportunity to ask questions and all were answered. The patient agreed with the plan and demonstrated an understanding of the instructions.   The patient was advised to call back or seek an in-person evaluation if the symptoms worsen or if the condition fails to improve as anticipated.  I provided 30 minutes of non-face-to-face time during this encounter.  HISTORY/CURRENT STATUS: HPI For routine med check.  See ROS. Feels better than she has in a long time. Not constipated now.  Energy and motivation are good.  She went back to work yesterday.  No extreme sadness, tearfulness, or feelings of hopelessness.  Sleeps well most of the time. ADLs and personal hygiene are normal.   Denies any changes in concentration, making decisions, or remembering things.  Appetite has not changed.  Weight is stable.  Anxiety is well controlled. No mania, delirium, AH/VH.  No SI/HI.  Individual Medical History/ Review of Systems: Changes? :Yes    Had sigmoid colon removed 01/21/2024. It was the best  thing I ever did.  Saw hem/onc recently, ferritin level was high, stopped the treatments and will watch. Starting hormone therapy today.   Past medications for mental health diagnoses include: Prozac, Paxil, Lexapro, Celexa, Zoloft , Viibryd , Effexor, Cymbalta , Risperdal , Latuda , Lamictal , VPA, Lithium -became toxic, Xanax , Wellbutrin , Abilify , Traz, Strattera, Kapvay, Saphris, Adderall, Vyvanse , Lunesta, Sonata ,  Vraylar, Seroquel , Geodon, Tegretal, Zyprexa  made her feel weird  Allergies: Iohexol , Rosuvastatin , Diatrizoate, and Oxycodone -acetaminophen   Current Medications:  Current Outpatient Medications:    acetaminophen  (TYLENOL ) 500 MG tablet, Take 1,000 mg by mouth every 6 (six) hours as needed for moderate pain., Disp: , Rfl:    albuterol  (PROVENTIL ) (2.5 MG/3ML) 0.083% nebulizer solution, Take 2.5 mg by nebulization every 2 (two) hours as needed for shortness of breath., Disp: , Rfl:    albuterol  (VENTOLIN  HFA) 108 (90 Base) MCG/ACT inhaler, Inhale 2 puffs into the lungs in the morning., Disp: , Rfl:    busPIRone  (BUSPAR ) 15 MG tablet, Take 2 tablets (30 mg total) by mouth 2 (two) times daily., Disp: 360 tablet, Rfl: 3   Dextromethorphan -buPROPion  ER (AUVELITY ) 45-105 MG TBCR, Take 1 tablet by mouth 2 (two) times daily., Disp: 180 tablet, Rfl: 3   famotidine  (PEPCID ) 40 MG tablet, Take 1 tablet (40 mg total) by mouth daily before supper., Disp: 30 tablet, Rfl: 5   fenofibrate  (TRICOR ) 145 MG tablet, Take 1 tablet (145 mg total) by mouth daily., Disp: 30 tablet, Rfl: 1   hydrOXYzine  (ATARAX ) 50 MG tablet, TAKE 1 TABLET BY MOUTH EVERY 6 HOURS AS NEEDED, Disp: 360 tablet, Rfl:  0   lamoTRIgine  (LAMICTAL ) 200 MG tablet, TAKE 2 & 1/2 (TWO & ONE-HALF) TABLETS BY MOUTH ONCE DAILY, Disp: 225 tablet, Rfl: 0   levothyroxine  (SYNTHROID ) 75 MCG tablet, Take 75 mcg by mouth daily before breakfast., Disp: , Rfl:    Melatonin 10 MG TABS, Take 10 mg by mouth at bedtime., Disp: , Rfl:    metFORMIN   (GLUCOPHAGE -XR) 750 MG 24 hr tablet, Take 750 mg by mouth daily with breakfast., Disp: , Rfl:    ondansetron  (ZOFRAN -ODT) 8 MG disintegrating tablet, Take 1 tablet (8 mg total) by mouth 2 (two) times daily as needed for nausea or vomiting., Disp: 60 tablet, Rfl: 5   Oxcarbazepine  (TRILEPTAL ) 300 MG tablet, Take 2 tablets by mouth twice daily (Patient taking differently: Take 600 mg by mouth 2 (two) times daily. 1 po am, 3 qhs), Disp: 360 tablet, Rfl: 0   pantoprazole  (PROTONIX ) 40 MG tablet, Take 1 tablet (40 mg total) by mouth daily before breakfast., Disp: 30 tablet, Rfl: 5   sertraline  (ZOLOFT ) 100 MG tablet, Take 3 tablets (300 mg total) by mouth at bedtime., Disp: 270 tablet, Rfl: 3   SYRINGE-NEEDLE, DISP, 3 ML (LUER LOCK SAFETY SYRINGES) 23G X 1 3 ML MISC, To be used with B 12 injections, Disp: 12 each, Rfl: 1   valACYclovir  (VALTREX ) 500 MG tablet, Take 500 mg by mouth in the morning., Disp: , Rfl:    ALPRAZolam  (XANAX ) 1 MG tablet, Take 1 tablet (1 mg total) by mouth every 6 (six) hours as needed for anxiety., Disp: 120 tablet, Rfl: 5   cyanocobalamin  (VITAMIN B12) 1000 MCG/ML injection, Inject 1 mL (1,000 mcg total) into the muscle once a week. 1 ml weekly X 4 and monthly X 6 (Patient taking differently: Inject 1,000 mcg into the muscle once a week.  monthly), Disp: 10 mL, Rfl: 3   cyclobenzaprine  (FLEXERIL ) 10 MG tablet, Take 10 mg by mouth at bedtime., Disp: , Rfl:    dicyclomine  (BENTYL ) 20 MG tablet, Take 1 tablet (20 mg total) by mouth 2 (two) times daily., Disp: 60 tablet, Rfl: 2   docusate sodium  (COLACE) 100 MG capsule, Take 100 mg by mouth at bedtime. (Patient not taking: Reported on 02/18/2024), Disp: , Rfl:    fexofenadine (ALLEGRA) 180 MG tablet, Take 180 mg by mouth daily. (Patient not taking: Reported on 02/18/2024), Disp: , Rfl:    ipratropium (ATROVENT ) 0.03 % nasal spray, Place 2 sprays into both nostrils 2 (two) times daily., Disp: , Rfl:    ondansetron  (ZOFRAN ) 8 MG tablet,  Take 1 tablet (8 mg total) by mouth every 8 (eight) hours as needed for nausea or vomiting., Disp: 90 tablet, Rfl: 2   Probiotic Product (ALIGN) 10 MG CAPS, Take 10 mg by mouth at bedtime. Digestive Support (Patient not taking: Reported on 02/18/2024), Disp: , Rfl:    rosuvastatin  (CRESTOR ) 5 MG tablet, Take 1 tablet (5 mg total) by mouth daily. (Patient taking differently: Take 5 mg by mouth at bedtime.), Disp: 30 tablet, Rfl: 1   RYBELSUS  7 MG TABS, Take 7 mg by mouth daily before breakfast. 30 min (Patient not taking: Reported on 02/18/2024), Disp: , Rfl:    Tenapanor  HCl (IBSRELA ) 50 MG TABS, Take 1 tablet by mouth 2 (two) times daily. (Patient not taking: Reported on 02/20/2024), Disp: 60 tablet, Rfl: 11 Medication Side Effects: Sweating  Family Medical/ Social History: Changes?  Still concerned about her son her who is in the [employer]. He's moving on with the  divorce. She's stressed about that.   MENTAL HEALTH EXAM:  There were no vitals taken for this visit.There is no height or weight on file to calculate BMI.  General Appearance: Casual and Well Groomed  Eye Contact:  Good  Speech:  Clear and Coherent and Normal Rate  Volume:  Normal  Mood:  Euthymic  Affect:  Congruent  Thought Process:  Goal Directed and Descriptions of Associations: Circumstantial  Orientation:  Full (Time, Place, and Person)  Thought Content: Logical   Suicidal Thoughts:  No  Homicidal Thoughts:  No  Memory:  WNL  Judgement:  Good  Insight:  Good  Psychomotor Activity:  Normal  Concentration:  Concentration: Good and Attention Span: Good  Recall:  Good  Fund of Knowledge: Good  Language: Good  Assets:  Communication Skills Desire for Improvement Financial Resources/Insurance Housing Transportation Vocational/Educational  ADL's:  Intact  Cognition: WNL  Prognosis:  Good   Labs from Dec. 2025 reviewed.  Gene site test results are under labs dated 04/03/2021.  DIAGNOSES:    ICD-10-CM   1. Bipolar I  disorder (HCC)  F31.9 Oxcarbazepine  (Trileptal ), Serum    2. Encounter for long-term (current) use of medications  Z79.899 Oxcarbazepine  (Trileptal ), Serum    3. Insomnia due to other mental disorder  F51.05    F99     4. Generalized anxiety disorder  F41.1       Receiving Psychotherapy: Yes    Rocky Andreas in the past.   RECOMMENDATIONS:  PDMP was reviewed.  Last Xanax  filled 02/15/2024. I provided approximately  30  minutes of non-face-to-face time during this encounter, including time spent before and after the visit in records review, medical decision making, counseling pertinent to today's visit, and charting.   I'm glad to see her doing so well!  No changes in tx needed.   Continue Xanax  1 mg, 1 po qid prn.  Continue Buspar  15 mg, to 2 po twice daily. Continue Auvelity  45-105 mg, 1 p.o. twice daily.   Continue hydroxyzine  to 50 mg, 1 p.o. every 6 hours as needed anxiety.   Continue Lamictal  200 mg, 2.5 pills daily. Continue Trileptal  300 mg,  1 q am and 3 at bedtime.  Continue Zoloft  100 mg, 3 p.o. daily. Continue vitamins as per med list.  Add biotin daily. Lab ordered as above.  Continue therapy. Return in 2 months.   Verneita Cooks, PA-C     "

## 2024-02-23 ENCOUNTER — Telehealth: Payer: Self-pay | Admitting: Physician Assistant

## 2024-02-23 NOTE — Telephone Encounter (Signed)
 Patient lvm at 4:44 stating that she needs to come of Auvelity . Didn't know a side effect was excessive sweating. She is experiencing beyond excessive sweating and would like to stop taking medication. PH: [phone] appt 3/9

## 2024-02-24 ENCOUNTER — Telehealth: Payer: Self-pay | Admitting: Physician Assistant

## 2024-02-24 ENCOUNTER — Encounter: Payer: Self-pay | Admitting: Oncology

## 2024-02-24 NOTE — Assessment & Plan Note (Signed)
 She is status post intermittent IV iron  with stable iron  levels. No additional IV iron  needed at this time. Given recent surgery, would recommend follow-up in 6 weeks with labs only to see if labs are holding steady versus additional infusions needed. Patient had quite a bit of her sigmoid removed which will likely decrease absorption of nutrients.

## 2024-02-24 NOTE — Telephone Encounter (Signed)
 I am very hesitant to get her off the Auvelity  right now since she is doing so well.  The Wellbutrin  part of it is what is causing the excessive sweating.  Before changing to Auvelity , she was on Wellbutrin  for years and to my knowledge did not complain of excessive sweating.  We have a couple of options that may be helpful.  1-treat the sweating with Terazosin or glycopyrrolate , prescription medications that we will need to be sent if she chooses that route.  The Terazosin is a blood pressure pill so she would need to watch her blood pressures.  2-we could decrease the Auvelity  from twice daily down to 1 pill daily to see if that would help.  3-we could discontinue the Auvelity  and switch back to Wellbutrin  to see if that makes a difference.

## 2024-02-24 NOTE — Telephone Encounter (Signed)
 Pt states she was given 3 options and she's going back on the Auvelity  once a day and wants to know if she should take it in the AM or PM.

## 2024-02-24 NOTE — Telephone Encounter (Signed)
 Do you recommend one versus the other for her?

## 2024-02-24 NOTE — Telephone Encounter (Signed)
 Rtc to pt and she reports she has been on Auvelity  for quite awhile but couldn't figure out where the excessive sweating was coming from. Informed her I would update Verneita with her side effect and see how she would like to proceed.

## 2024-02-24 NOTE — Telephone Encounter (Signed)
 Rtc to patient and gave her options, highly suggested she consider the medications listed for sweating rather than change due to mood stabilization. Pt is going to look into both of those and give us  a call back on how she would like to proceed.

## 2024-02-25 NOTE — Telephone Encounter (Signed)
 Yes,Auvelity  once/day in the morning

## 2024-02-26 ENCOUNTER — Other Ambulatory Visit: Payer: Self-pay | Admitting: Physician Assistant

## 2024-02-26 ENCOUNTER — Telehealth: Payer: Self-pay | Admitting: Physician Assistant

## 2024-02-26 NOTE — Telephone Encounter (Addendum)
 FYI Pt called reporting she is going to continue taking Auvelity  45-105 2/d. Also, restarted taking Oxcarbazepine  300 mg again last night. Felt like she could bite the head off rattlesnake. Took 1- 300 mg Oxcarbazepine  last night.

## 2024-02-26 NOTE — Telephone Encounter (Signed)
 Called patient to see if she needed any refills. She said she did not, but the medications are Auvelity  and olanzapine . She is taking the oxcarbazepine  also, but had not stopped it.

## 2024-02-26 NOTE — Telephone Encounter (Signed)
 Please let her know.

## 2024-02-27 NOTE — Telephone Encounter (Signed)
 Spoke with pt and verbally understood recommendation

## 2024-02-27 NOTE — Telephone Encounter (Signed)
 Noted.

## 2024-03-02 ENCOUNTER — Telehealth: Admitting: Physician Assistant

## 2024-03-31 ENCOUNTER — Inpatient Hospital Stay: Attending: Oncology

## 2024-04-19 ENCOUNTER — Telehealth: Admitting: Physician Assistant

## 2024-05-05 ENCOUNTER — Inpatient Hospital Stay

## 2024-05-12 ENCOUNTER — Inpatient Hospital Stay: Admitting: Oncology
